# Patient Record
Sex: Female | Born: 1935 | Race: White | Hispanic: No | Smoking: Former smoker
Health system: Southern US, Community
[De-identification: ages and names within clinical notes are randomized; demographics above are authoritative.]

## PROBLEM LIST (undated history)

## (undated) DIAGNOSIS — F419 Anxiety disorder, unspecified: Secondary | ICD-10-CM

## (undated) DIAGNOSIS — R55 Syncope and collapse: Secondary | ICD-10-CM

## (undated) DIAGNOSIS — R51 Headache: Secondary | ICD-10-CM

## (undated) DIAGNOSIS — I2510 Atherosclerotic heart disease of native coronary artery without angina pectoris: Secondary | ICD-10-CM

## (undated) DIAGNOSIS — Z9861 Coronary angioplasty status: Secondary | ICD-10-CM

## (undated) DIAGNOSIS — I420 Dilated cardiomyopathy: Secondary | ICD-10-CM

## (undated) DIAGNOSIS — I4891 Unspecified atrial fibrillation: Secondary | ICD-10-CM

## (undated) DIAGNOSIS — J189 Pneumonia, unspecified organism: Secondary | ICD-10-CM

## (undated) DIAGNOSIS — F329 Major depressive disorder, single episode, unspecified: Secondary | ICD-10-CM

## (undated) DIAGNOSIS — G43909 Migraine, unspecified, not intractable, without status migrainosus: Secondary | ICD-10-CM

## (undated) DIAGNOSIS — I714 Abdominal aortic aneurysm, without rupture, unspecified: Secondary | ICD-10-CM

## (undated) DIAGNOSIS — F32A Depression, unspecified: Secondary | ICD-10-CM

## (undated) DIAGNOSIS — J449 Chronic obstructive pulmonary disease, unspecified: Secondary | ICD-10-CM

## (undated) DIAGNOSIS — B370 Candidal stomatitis: Secondary | ICD-10-CM

## (undated) DIAGNOSIS — R519 Headache, unspecified: Secondary | ICD-10-CM

## (undated) DIAGNOSIS — E785 Hyperlipidemia, unspecified: Secondary | ICD-10-CM

## (undated) DIAGNOSIS — G459 Transient cerebral ischemic attack, unspecified: Secondary | ICD-10-CM

## (undated) DIAGNOSIS — I2109 ST elevation (STEMI) myocardial infarction involving other coronary artery of anterior wall: Secondary | ICD-10-CM

## (undated) DIAGNOSIS — K589 Irritable bowel syndrome without diarrhea: Secondary | ICD-10-CM

## (undated) HISTORY — PX: FRACTURE SURGERY: SHX138

## (undated) HISTORY — PX: TONSILLECTOMY: SUR1361

## (undated) HISTORY — DX: Abdominal aortic aneurysm, without rupture: I71.4

## (undated) HISTORY — DX: Abdominal aortic aneurysm, without rupture, unspecified: I71.40

## (undated) HISTORY — PX: LUMBAR DISC SURGERY: SHX700

## (undated) HISTORY — PX: BACK SURGERY: SHX140

## (undated) HISTORY — PX: ABDOMINAL HYSTERECTOMY: SHX81

## (undated) HISTORY — PX: TOTAL HIP ARTHROPLASTY: SHX124

## (undated) HISTORY — PX: NECK SURGERY: SHX720

## (undated) HISTORY — DX: Unspecified atrial fibrillation: I48.91

## (undated) HISTORY — PX: COLECTOMY: SHX59

---

## 2013-07-04 DIAGNOSIS — J189 Pneumonia, unspecified organism: Secondary | ICD-10-CM

## 2013-07-04 HISTORY — DX: Pneumonia, unspecified organism: J18.9

## 2013-07-09 ENCOUNTER — Emergency Department (HOSPITAL_BASED_OUTPATIENT_CLINIC_OR_DEPARTMENT_OTHER)
Admission: EM | Admit: 2013-07-09 | Discharge: 2013-07-09 | Disposition: A | Discharge status: Other Acute Inpatient Hospital | Payer: Medicare Other | Attending: Emergency Medicine | Admitting: Emergency Medicine

## 2013-07-09 ENCOUNTER — Emergency Department (HOSPITAL_BASED_OUTPATIENT_CLINIC_OR_DEPARTMENT_OTHER): Payer: Medicare Other

## 2013-07-09 ENCOUNTER — Encounter (HOSPITAL_BASED_OUTPATIENT_CLINIC_OR_DEPARTMENT_OTHER): Payer: Self-pay | Admitting: Emergency Medicine

## 2013-07-09 DIAGNOSIS — J4489 Other specified chronic obstructive pulmonary disease: Secondary | ICD-10-CM | POA: Insufficient documentation

## 2013-07-09 DIAGNOSIS — R197 Diarrhea, unspecified: Secondary | ICD-10-CM | POA: Insufficient documentation

## 2013-07-09 DIAGNOSIS — Z79899 Other long term (current) drug therapy: Secondary | ICD-10-CM | POA: Insufficient documentation

## 2013-07-09 DIAGNOSIS — E860 Dehydration: Secondary | ICD-10-CM | POA: Insufficient documentation

## 2013-07-09 DIAGNOSIS — J189 Pneumonia, unspecified organism: Secondary | ICD-10-CM

## 2013-07-09 DIAGNOSIS — Z8673 Personal history of transient ischemic attack (TIA), and cerebral infarction without residual deficits: Secondary | ICD-10-CM | POA: Insufficient documentation

## 2013-07-09 DIAGNOSIS — F329 Major depressive disorder, single episode, unspecified: Secondary | ICD-10-CM | POA: Insufficient documentation

## 2013-07-09 DIAGNOSIS — R05 Cough: Secondary | ICD-10-CM | POA: Insufficient documentation

## 2013-07-09 DIAGNOSIS — R45851 Suicidal ideations: Secondary | ICD-10-CM | POA: Insufficient documentation

## 2013-07-09 DIAGNOSIS — R109 Unspecified abdominal pain: Secondary | ICD-10-CM | POA: Insufficient documentation

## 2013-07-09 DIAGNOSIS — F3289 Other specified depressive episodes: Secondary | ICD-10-CM | POA: Insufficient documentation

## 2013-07-09 DIAGNOSIS — R9431 Abnormal electrocardiogram [ECG] [EKG]: Secondary | ICD-10-CM

## 2013-07-09 DIAGNOSIS — R059 Cough, unspecified: Secondary | ICD-10-CM | POA: Insufficient documentation

## 2013-07-09 DIAGNOSIS — F411 Generalized anxiety disorder: Secondary | ICD-10-CM | POA: Insufficient documentation

## 2013-07-09 DIAGNOSIS — R51 Headache: Secondary | ICD-10-CM | POA: Insufficient documentation

## 2013-07-09 DIAGNOSIS — F17200 Nicotine dependence, unspecified, uncomplicated: Secondary | ICD-10-CM | POA: Insufficient documentation

## 2013-07-09 DIAGNOSIS — F419 Anxiety disorder, unspecified: Secondary | ICD-10-CM

## 2013-07-09 DIAGNOSIS — J159 Unspecified bacterial pneumonia: Secondary | ICD-10-CM | POA: Insufficient documentation

## 2013-07-09 DIAGNOSIS — R9389 Abnormal findings on diagnostic imaging of other specified body structures: Secondary | ICD-10-CM | POA: Insufficient documentation

## 2013-07-09 DIAGNOSIS — J449 Chronic obstructive pulmonary disease, unspecified: Secondary | ICD-10-CM | POA: Insufficient documentation

## 2013-07-09 HISTORY — DX: Depression, unspecified: F32.A

## 2013-07-09 HISTORY — DX: Anxiety disorder, unspecified: F41.9

## 2013-07-09 HISTORY — DX: Transient cerebral ischemic attack, unspecified: G45.9

## 2013-07-09 HISTORY — DX: Chronic obstructive pulmonary disease, unspecified: J44.9

## 2013-07-09 HISTORY — DX: Major depressive disorder, single episode, unspecified: F32.9

## 2013-07-09 LAB — CBC WITH DIFFERENTIAL/PLATELET
BASOS ABS: 0 10*3/uL (ref 0.0–0.1)
Basophils Relative: 0 % (ref 0–1)
Eosinophils Absolute: 0 10*3/uL (ref 0.0–0.7)
Eosinophils Relative: 0 % (ref 0–5)
HEMATOCRIT: 41.1 % (ref 36.0–46.0)
HEMOGLOBIN: 14.3 g/dL (ref 12.0–15.0)
LYMPHS PCT: 5 % — AB (ref 12–46)
Lymphs Abs: 0.5 10*3/uL — ABNORMAL LOW (ref 0.7–4.0)
MCH: 34.1 pg — ABNORMAL HIGH (ref 26.0–34.0)
MCHC: 34.8 g/dL (ref 30.0–36.0)
MCV: 98.1 fL (ref 78.0–100.0)
MONO ABS: 1.5 10*3/uL — AB (ref 0.1–1.0)
MONOS PCT: 16 % — AB (ref 3–12)
Neutro Abs: 7.4 10*3/uL (ref 1.7–7.7)
Neutrophils Relative %: 79 % — ABNORMAL HIGH (ref 43–77)
Platelets: 288 10*3/uL (ref 150–400)
RBC: 4.19 MIL/uL (ref 3.87–5.11)
RDW: 12.1 % (ref 11.5–15.5)
WBC: 9.4 10*3/uL (ref 4.0–10.5)

## 2013-07-09 LAB — BASIC METABOLIC PANEL
BUN: 8 mg/dL (ref 6–23)
CO2: 25 mEq/L (ref 19–32)
CREATININE: 0.8 mg/dL (ref 0.50–1.10)
Calcium: 10.3 mg/dL (ref 8.4–10.5)
Chloride: 91 mEq/L — ABNORMAL LOW (ref 96–112)
GFR calc Af Amer: 81 mL/min — ABNORMAL LOW (ref >90)
GFR calc non Af Amer: 70 mL/min — ABNORMAL LOW (ref >90)
Glucose, Bld: 135 mg/dL — ABNORMAL HIGH (ref 70–99)
Potassium: 4.2 mEq/L (ref 3.7–5.3)
Sodium: 133 mEq/L — ABNORMAL LOW (ref 137–147)

## 2013-07-09 LAB — RAPID URINE DRUG SCREEN, HOSP PERFORMED
Amphetamines: NOT DETECTED
BENZODIAZEPINES: NOT DETECTED
Barbiturates: NOT DETECTED
Cocaine: NOT DETECTED
Opiates: NOT DETECTED
Tetrahydrocannabinol: NOT DETECTED

## 2013-07-09 LAB — CK: Total CK: 83 U/L (ref 7–177)

## 2013-07-09 LAB — ACETAMINOPHEN LEVEL

## 2013-07-09 LAB — URINALYSIS, ROUTINE W REFLEX MICROSCOPIC
Bilirubin Urine: NEGATIVE
GLUCOSE, UA: NEGATIVE mg/dL
Ketones, ur: NEGATIVE mg/dL
LEUKOCYTES UA: NEGATIVE
Nitrite: NEGATIVE
PH: 7 (ref 5.0–8.0)
Protein, ur: NEGATIVE mg/dL
SPECIFIC GRAVITY, URINE: 1.005 (ref 1.005–1.030)
Urobilinogen, UA: 0.2 mg/dL (ref 0.0–1.0)

## 2013-07-09 LAB — HEPATIC FUNCTION PANEL
ALT: 10 U/L (ref 0–35)
AST: 24 U/L (ref 0–37)
Albumin: 4.3 g/dL (ref 3.5–5.2)
Alkaline Phosphatase: 63 U/L (ref 39–117)
BILIRUBIN TOTAL: 0.4 mg/dL (ref 0.3–1.2)
TOTAL PROTEIN: 8 g/dL (ref 6.0–8.3)

## 2013-07-09 LAB — URINE MICROSCOPIC-ADD ON

## 2013-07-09 LAB — TROPONIN I

## 2013-07-09 LAB — ETHANOL: Alcohol, Ethyl (B): <11 mg/dL (ref 0–11)

## 2013-07-09 LAB — SALICYLATE LEVEL: Salicylate Lvl: <2 mg/dL — ABNORMAL LOW (ref 2.8–20.0)

## 2013-07-09 MED ORDER — LORAZEPAM 2 MG/ML IJ SOLN: 1.0000 mg | Freq: Once | INTRAMUSCULAR | Status: DC

## 2013-07-09 MED ORDER — LORAZEPAM 2 MG/ML IJ SOLN
INTRAMUSCULAR | Status: AC
  Filled 2013-07-09: qty 1

## 2013-07-09 MED ORDER — AZITHROMYCIN 250 MG PO TABS
ORAL_TABLET | ORAL | Status: AC
  Filled 2013-07-09: qty 1

## 2013-07-09 MED ORDER — ACETAMINOPHEN 325 MG PO TABS
650.0000 mg | ORAL_TABLET | Freq: Once | ORAL | Status: AC
  Administered 2013-07-09: 650 mg via ORAL
  Filled 2013-07-09: qty 2

## 2013-07-09 MED ORDER — DEXTROSE 5 % IV SOLN
1.0000 g | Freq: Once | INTRAVENOUS | Status: AC
  Administered 2013-07-09: 1 g via INTRAVENOUS

## 2013-07-09 MED ORDER — CEFTRIAXONE SODIUM 1 G IJ SOLR
INTRAMUSCULAR | Status: AC
  Filled 2013-07-09: qty 10

## 2013-07-09 MED ORDER — LORAZEPAM 2 MG/ML IJ SOLN
1.0000 mg | Freq: Once | INTRAMUSCULAR | Status: AC
  Administered 2013-07-09: 1 mg via INTRAVENOUS
  Filled 2013-07-09: qty 1

## 2013-07-09 MED ORDER — SODIUM CHLORIDE 0.9 % IV BOLUS (SEPSIS)
1000.0000 mL | Freq: Once | INTRAVENOUS | Status: AC
  Administered 2013-07-09: 1000 mL via INTRAVENOUS

## 2013-07-09 MED ORDER — AZITHROMYCIN 250 MG PO TABS
500.0000 mg | ORAL_TABLET | Freq: Once | ORAL | Status: AC
  Administered 2013-07-09: 500 mg via ORAL
  Filled 2013-07-09: qty 2

## 2013-07-09 NOTE — ED Notes (Signed)
Report called to [place], RN [facility] Report to [place], NIKEN [facility].

## 2013-07-09 NOTE — ED Notes (Signed)
PCXR performed

## 2013-07-09 NOTE — ED Notes (Signed)
Room changed to 716 at [facility]

## 2013-07-09 NOTE — ED Notes (Signed)
Pt is anxious with tremors.

## 2013-07-09 NOTE — ED Provider Notes (Signed)
CSN: [id number]     Arrival date & time 07/09/13  1040 History   First MD Initiated Contact with Patient 07/09/13 1119     Chief Complaint  Patient presents with  . Anxiety      Patient is a 79 y.o. female presenting with anxiety. The history is provided by the patient and a relative.  Anxiety This is a recurrent problem. The current episode started more than 1 week ago. The problem occurs constantly. The problem has been rapidly worsening. Associated symptoms include abdominal pain and headaches. Pertinent negatives include no chest pain. Associated symptoms comments: Chronic abdominal pain . Nothing aggravates the symptoms. Nothing relieves the symptoms.   Pt presents for multiple complaints She has problems with anxiety and depression . She reports she has thoughts to harm herself, but she does not have a plan.  She reports previous suicide attempt last year by overdose.  Denies self harm today  She also reports chronic abdominal pain and diarrhea for "months" She also reports mild HA   She reports to drinking glass of wine daily, last drink was 2 days ago Denies h/o ETOH withdrawal Past Medical History  Diagnosis Date  . COPD (chronic obstructive pulmonary disease)   . Anxiety   . Depression   . TIA (transient ischemic attack)    Past Surgical History  Procedure Laterality Date  . Back surgery    . Neck surgery    . Colectomy     No family history on file. History  Substance Use Topics  . Smoking status: Current Every Day Smoker -- 0.50 packs/day    Types: Cigarettes  . Smokeless tobacco: Not on file  . Alcohol Use: 1.2 oz/week    2 Glasses of wine per week     Comment: last drink was Wednesday   OB History   Grav Para Term Preterm Abortions TAB SAB Ect Mult Living                 Review of Systems  Constitutional: Positive for fatigue.  Respiratory: Positive for cough.   Cardiovascular: Negative for chest pain.  Gastrointestinal: Positive for abdominal pain  and diarrhea.  Neurological: Positive for headaches. Negative for syncope.       Denies focal weakness, denies stroke like symptoms   Psychiatric/Behavioral: Positive for suicidal ideas. The patient is nervous/anxious.   All other systems reviewed and are negative.      Allergies  Review of patient's allergies indicates no known allergies.  Home Medications   Current Outpatient Rx  Name  Route  Sig  Dispense  Refill  . diphenoxylate-atropine (LOMOTIL) 2.5-0.025 MG per tablet   Oral   Take by mouth 4 (four) times daily as needed for diarrhea or loose stools.         . venlafaxine (EFFEXOR) 37.5 MG tablet   Oral   Take 37.5 mg by mouth daily.          BP 148/65  Pulse 116  Temp(Src) 98.6 F (37 C) (Oral)  Resp 20  Ht 5' (1.524 m)  Wt 79 lb 12.9 oz (36.2 kg)  BMI 15.59 kg/m2  SpO2 100% BP 148/74  Pulse 101  Temp(Src) 98.6 F (37 C) (Oral)  Resp 22  Ht 5' (1.524 m)  Wt 79 lb 12.9 oz (36.2 kg)  BMI 15.59 kg/m2  SpO2 99%  Physical Exam CONSTITUTIONAL: elderly, frail  HEAD: Normocephalic/atraumatic EYES: EOMI ENMT: Mucous membranes dry NECK: supple no meningeal signs SPINE:entire spine nontender, No  bruising/crepitance/stepoffs noted to spine CV: tachycardic S1/S2 noted, no murmurs/rubs/gallops noted LUNGS: crackles left base, mild tachypnea noted ABDOMEN: soft, nontender, no rebound or guarding GU:no cva tenderness NEURO: Pt is awake/alert, moves all extremitiesx4, no arm/leg drift is noted.  Tremors noted bilateral UE EXTREMITIES: pulses normal, full ROM SKIN: warm, color normal PSYCH:   ED Course  Procedures  12:20 PM Pt here for multiple complaints, but significant for anxiety and SI.  telepsych has been consulted and a sitter has been requested.  Will also evaluate with labs/imaging and give ativan for anxiety Pt agreeable with plan 12:31 PM Seen by telepsych [place] Pt would benefit from psych admission Will call back once medically cleared Call  back at [phone] 1:28 PM Pt admitted in 12/2012 for suicidal ideation.  She had overdosed on prozac and remeron 2:05 PM Her EKG is abnormal, though no CP currently and negative troponin She still has tremors Possible pneumonia by xray No recent admissions in past 3 months Will need medical admission for abnormal EKG, pneumonia (troponin is negative, no active CP at this time) BP 148/74  Pulse 101  Temp(Src) 98.6 F (37 C) (Oral)  Resp 22  Ht 5' (1.524 m)  Wt 79 lb 12.9 oz (36.2 kg)  BMI 15.59 kg/m2  SpO2 99% She prefers [facility] for admission Will also need inpatient psych consult 2:14 PM D/w PA Karalee Height at [facility] Pt to be accepted to their service Dr. Sherlynn Stalls Advised she would need sitter until evaluated by psychiatry Pt stabilized in the ER Labs Review Labs Reviewed  URINALYSIS, ROUTINE W REFLEX MICROSCOPIC - Abnormal; Notable for the following:    Hgb urine dipstick TRACE (*)    All other components within normal limits  CBC WITH DIFFERENTIAL - Abnormal; Notable for the following:    MCH 34.1 (*)    Neutrophils Relative % 79 (*)    Lymphocytes Relative 5 (*)    Lymphs Abs 0.5 (*)    Monocytes Relative 16 (*)    Monocytes Absolute 1.5 (*)    All other components within normal limits  BASIC METABOLIC PANEL - Abnormal; Notable for the following:    Sodium 133 (*)    Chloride 91 (*)    Glucose, Bld 135 (*)    GFR calc non Af Amer 70 (*)    GFR calc Af Amer 81 (*)    All other components within normal limits  SALICYLATE LEVEL - Abnormal; Notable for the following:    Salicylate Lvl <2.0 (*)    All other components within normal limits  ETHANOL  URINE RAPID DRUG SCREEN (HOSP PERFORMED)  HEPATIC FUNCTION PANEL  ACETAMINOPHEN LEVEL  CK  URINE MICROSCOPIC-ADD ON  TROPONIN I  URINALYSIS, ROUTINE W REFLEX MICROSCOPIC  URINE RAPID DRUG SCREEN (HOSP PERFORMED)   Imaging Review Dg Chest Portable 1 View  07/09/2013   CLINICAL DATA:  cough   EXAM: PORTABLE CHEST - 1 VIEW  COMPARISON:  None.  FINDINGS: The heart size and mediastinal contours are within normal limits. Areas of vague density projects within the lung bases. An area of ill-defined density with a slight nodular component projects in the periphery of the right hemi thorax. There is mild prominence of the interstitial markings. No focal regions consolidation appreciated. The osseous structures demonstrate no acute abnormalities. The bones are osteopenic. The lungs appear hyperinflated.  IMPRESSION: Vague areas of increased density which may represent infiltrates versus atelectasis in the region periphery of the right hemothorax as  well as within the lung bases. Further evaluation with dedicated PA and lateral views of the chest is recommended.  Mild prominence of the interstitial markings likely representing underlying chronic bronchitic changes.  Mild hyperinflation.   Electronically Signed   By: Salome Holmes M.D.   On: 07/09/2013 13:11     EKG Interpretation  Date/Time:  Friday July 09 2013 13:03:41 EST Ventricular Rate:  98 PR Interval:  138 QRS Duration: 64 QT Interval:  408 QTC Calculation: 520 R Axis:   86 Text Interpretation:  Normal sinus rhythm Anteroseptal infarct , age undetermined ST \\T \ T wave abnormality, consider inferolateral ischemia Prolonged QT Abnormal ECG EKG from [facility] admission to [facility] from 12/06/2012 did not show these abnormalities Confirmed by Bebe Shaggy  MD, Tonianne Fine ([id number]) on 07/09/2013 1:31:38 PM          MDM  Nursing notes including past medical history and social history reviewed and considered in documentation xrays reviewed and considered Labs/vital reviewed and considered Previous records reviewed and considered - records from OSH reviewed  Final diagnoses:  CAP (community acquired pneumonia)  Dehydration  Anxiety  Suicidal ideation  Abnormal EKG        Joya Gaskins, MD 07/09/13 1416

## 2013-07-09 NOTE — ED Notes (Signed)
[facility] is transporting patient to [facility] to room 657
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
## 2013-07-09 NOTE — ED Notes (Signed)
Urine recollected 

## 2013-07-09 NOTE — ED Notes (Signed)
TTS interviewing pt.  Family remains at bedside.

## 2013-07-09 NOTE — ED Notes (Signed)
[facility]

## 2013-07-09 NOTE — ED Notes (Signed)
PO fluids and warm blankets provided.  Daughter in law at bedside.

## 2013-07-09 NOTE — BH Assessment (Signed)
Writer contacted [facility] to follow up on patient's disposition. Writer spoke to patients nurse who stated that patient was being admitted to the medical floor. No further services needed by TTS.

## 2013-07-09 NOTE — ED Notes (Signed)
[facility] about noon.

## 2013-07-09 NOTE — ED Notes (Signed)
[facility]

## 2013-07-09 NOTE — ED Notes (Signed)
MD at bedside. 

## 2013-07-09 NOTE — ED Notes (Signed)
Pt and family informed of plan of care and admission 

## 2013-07-09 NOTE — BH Assessment (Signed)
Writer scheduled to see this patient at noon via TA.

## 2013-07-09 NOTE — ED Notes (Signed)
Awaiting a bed.

## 2013-07-09 NOTE — ED Notes (Signed)
Called AC and no sitter available. Staffing called and made aware if one is available.

## 2013-07-09 NOTE — BH Assessment (Signed)
Patient seen by TTS (TA completed). Patient reports suicidal thoughts, no plan. Family (Daughter in Social workerlaw) is however not open to inpatient treatment due to a previous bad experience at another facility. Discussed reluctance for inpatient tx with EDP-Dr. Bebe ShaggyWickline and he will re-evaluate patient's suicidal ideations. TTS also learned after completing the assessment that pt is not medically cleared. Dr. Bebe ShaggyWickline is working on medical concerns. Patient is at a stand still at this time until Dr. Bebe ShaggyWickline updates TTS on how to proceed with patient's care.

## 2013-07-09 NOTE — ED Notes (Signed)
Pt reports she does not take medication as prescribed because "they make my stomach hurt and my mouth dry".

## 2013-07-09 NOTE — ED Notes (Signed)
Pt has chronic anxiety and depression.  The past 2 days she has had increased anxiety, diarrhea and not eating.  She expresses SI without a plan.

## 2013-07-09 NOTE — ED Notes (Signed)
Pt transferred to [facility] and in stable condition upon transfer.

## 2014-05-06 DIAGNOSIS — I714 Abdominal aortic aneurysm, without rupture, unspecified: Secondary | ICD-10-CM

## 2014-05-06 HISTORY — DX: Abdominal aortic aneurysm, without rupture: I71.4

## 2014-05-06 HISTORY — DX: Abdominal aortic aneurysm, without rupture, unspecified: I71.40

## 2014-08-17 ENCOUNTER — Emergency Department (HOSPITAL_BASED_OUTPATIENT_CLINIC_OR_DEPARTMENT_OTHER): Payer: Medicare Other

## 2014-08-17 ENCOUNTER — Emergency Department (HOSPITAL_BASED_OUTPATIENT_CLINIC_OR_DEPARTMENT_OTHER)
Admission: EM | Admit: 2014-08-17 | Discharge: 2014-08-17 | Disposition: A | Discharge status: Home / Self Care | Payer: Medicare Other | Attending: Emergency Medicine | Admitting: Emergency Medicine

## 2014-08-17 ENCOUNTER — Other Ambulatory Visit: Payer: Self-pay | Admitting: *Deleted

## 2014-08-17 ENCOUNTER — Encounter (HOSPITAL_BASED_OUTPATIENT_CLINIC_OR_DEPARTMENT_OTHER): Payer: Self-pay | Admitting: *Deleted

## 2014-08-17 ENCOUNTER — Other Ambulatory Visit: Payer: Self-pay

## 2014-08-17 DIAGNOSIS — Z23 Encounter for immunization: Secondary | ICD-10-CM | POA: Insufficient documentation

## 2014-08-17 DIAGNOSIS — I714 Abdominal aortic aneurysm, without rupture, unspecified: Secondary | ICD-10-CM

## 2014-08-17 DIAGNOSIS — Y9289 Other specified places as the place of occurrence of the external cause: Secondary | ICD-10-CM | POA: Diagnosis not present

## 2014-08-17 DIAGNOSIS — S3992XA Unspecified injury of lower back, initial encounter: Secondary | ICD-10-CM | POA: Insufficient documentation

## 2014-08-17 DIAGNOSIS — J449 Chronic obstructive pulmonary disease, unspecified: Secondary | ICD-10-CM | POA: Diagnosis not present

## 2014-08-17 DIAGNOSIS — M545 Low back pain: Secondary | ICD-10-CM

## 2014-08-17 DIAGNOSIS — S4991XA Unspecified injury of right shoulder and upper arm, initial encounter: Secondary | ICD-10-CM | POA: Diagnosis not present

## 2014-08-17 DIAGNOSIS — Y9389 Activity, other specified: Secondary | ICD-10-CM | POA: Diagnosis not present

## 2014-08-17 DIAGNOSIS — S199XXA Unspecified injury of neck, initial encounter: Secondary | ICD-10-CM | POA: Insufficient documentation

## 2014-08-17 DIAGNOSIS — Z720 Tobacco use: Secondary | ICD-10-CM | POA: Diagnosis not present

## 2014-08-17 DIAGNOSIS — R51 Headache: Secondary | ICD-10-CM | POA: Insufficient documentation

## 2014-08-17 DIAGNOSIS — Z8673 Personal history of transient ischemic attack (TIA), and cerebral infarction without residual deficits: Secondary | ICD-10-CM | POA: Diagnosis not present

## 2014-08-17 DIAGNOSIS — Y998 Other external cause status: Secondary | ICD-10-CM | POA: Insufficient documentation

## 2014-08-17 DIAGNOSIS — F419 Anxiety disorder, unspecified: Secondary | ICD-10-CM | POA: Diagnosis not present

## 2014-08-17 DIAGNOSIS — W01198A Fall on same level from slipping, tripping and stumbling with subsequent striking against other object, initial encounter: Secondary | ICD-10-CM | POA: Insufficient documentation

## 2014-08-17 DIAGNOSIS — W19XXXA Unspecified fall, initial encounter: Secondary | ICD-10-CM

## 2014-08-17 DIAGNOSIS — Z48812 Encounter for surgical aftercare following surgery on the circulatory system: Secondary | ICD-10-CM

## 2014-08-17 DIAGNOSIS — Z79899 Other long term (current) drug therapy: Secondary | ICD-10-CM | POA: Diagnosis not present

## 2014-08-17 DIAGNOSIS — F329 Major depressive disorder, single episode, unspecified: Secondary | ICD-10-CM | POA: Diagnosis not present

## 2014-08-17 LAB — CBC WITH DIFFERENTIAL/PLATELET
BASOS ABS: 0 10*3/uL (ref 0.0–0.1)
Basophils Relative: 0 % (ref 0–1)
EOS PCT: 1 % (ref 0–5)
Eosinophils Absolute: 0.1 10*3/uL (ref 0.0–0.7)
HEMATOCRIT: 42.4 % (ref 36.0–46.0)
Hemoglobin: 14.9 g/dL (ref 12.0–15.0)
Lymphocytes Relative: 29 % (ref 12–46)
Lymphs Abs: 2.5 10*3/uL (ref 0.7–4.0)
MCH: 34.7 pg — ABNORMAL HIGH (ref 26.0–34.0)
MCHC: 35.1 g/dL (ref 30.0–36.0)
MCV: 98.6 fL (ref 78.0–100.0)
MONO ABS: 0.8 10*3/uL (ref 0.1–1.0)
Monocytes Relative: 9 % (ref 3–12)
Neutro Abs: 5.4 10*3/uL (ref 1.7–7.7)
Neutrophils Relative %: 61 % (ref 43–77)
PLATELETS: 271 10*3/uL (ref 150–400)
RBC: 4.3 MIL/uL (ref 3.87–5.11)
RDW: 12.9 % (ref 11.5–15.5)
WBC: 8.8 10*3/uL (ref 4.0–10.5)

## 2014-08-17 LAB — TROPONIN I: Troponin I: <0.03 ng/mL (ref <0.031)

## 2014-08-17 LAB — BASIC METABOLIC PANEL
Anion gap: 9 (ref 5–15)
BUN: 14 mg/dL (ref 6–23)
CO2: 28 mmol/L (ref 19–32)
Calcium: 9.5 mg/dL (ref 8.4–10.5)
Chloride: 98 mmol/L (ref 96–112)
Creatinine, Ser: 0.97 mg/dL (ref 0.50–1.10)
GFR calc Af Amer: 64 mL/min — ABNORMAL LOW (ref >90)
GFR, EST NON AFRICAN AMERICAN: 55 mL/min — AB (ref >90)
Glucose, Bld: 102 mg/dL — ABNORMAL HIGH (ref 70–99)
POTASSIUM: 4.1 mmol/L (ref 3.5–5.1)
SODIUM: 135 mmol/L (ref 135–145)

## 2014-08-17 MED ORDER — ACETAMINOPHEN 325 MG PO TABS
650.0000 mg | ORAL_TABLET | Freq: Once | ORAL | Status: AC
  Administered 2014-08-17: 650 mg via ORAL
  Filled 2014-08-17: qty 2

## 2014-08-17 MED ORDER — TETANUS-DIPHTH-ACELL PERTUSSIS 5-2.5-18.5 LF-MCG/0.5 IM SUSP
0.5000 mL | Freq: Once | INTRAMUSCULAR | Status: AC
  Administered 2014-08-17: 0.5 mL via INTRAMUSCULAR
  Filled 2014-08-17: qty 0.5

## 2014-08-17 NOTE — ED Provider Notes (Signed)
CSN: [id number]     Arrival date & time 08/17/14  1211 History  This chart was scribed for Tonya Octave, MD by Leone Payor, ED Scribe. This patient was seen in room MH01/MH01 and the patient's care was started 12:36 PM.    Chief Complaint  Patient presents with  . Fall    The history is provided by the patient. No language interpreter was used.     HPI Comments: Tonya Payne is a 80 y.o. female who presents to the Emergency Department s/p fall complaining of gradual onset, constant, gradually worsened back pain, neck pain, and right shoulder pain that began after a fall 2 nights ago. Patient states she lost her balance in the bathroom and fell backwards into the tub, striking her head, buttocks, and back. She reports a head injury but no LOC. She reports having increased pain today so her neighbor took her to an Urgent Care. She was transferred here from [facility] for further evaluation. She denies anticoagulant use. She denies abdominal pain, chest pain, SOB, hip pain.   Past Medical History  Diagnosis Date  . COPD (chronic obstructive pulmonary disease)   . Anxiety   . Depression   . TIA (transient ischemic attack)    Past Surgical History  Procedure Laterality Date  . Back surgery    . Neck surgery    . Colectomy     No family history on file. History  Substance Use Topics  . Smoking status: Current Every Day Smoker -- 0.50 packs/day    Types: Cigarettes  . Smokeless tobacco: Never Used  . Alcohol Use: 1.2 oz/week    2 Glasses of wine per week   OB History    No data available     Review of Systems  A complete 10 system review of systems was obtained and all systems are negative except as noted in the HPI and PMH.    Allergies  Review of patient's allergies indicates no known allergies.  Home Medications   Prior to Admission medications   Medication Sig Start Date End Date Taking? Authorizing Provider  alendronate (FOSAMAX) 70 MG tablet Take 70 mg by mouth once  a week. Take with a full glass of water on an empty stomach.   Yes Historical Provider, MD  escitalopram (LEXAPRO) 10 MG tablet Take 10 mg by mouth daily.   Yes Historical Provider, MD  traZODone (DESYREL) 100 MG tablet Take 100 mg by mouth at bedtime.   Yes Historical Provider, MD  diphenoxylate-atropine (LOMOTIL) 2.5-0.025 MG per tablet Take by mouth 4 (four) times daily as needed for diarrhea or loose stools.    Historical Provider, MD   BP 115/77 mmHg  Pulse 77  Temp(Src) 98 F (36.7 C) (Oral)  Resp 20  Ht 5' (1.524 m)  SpO2 100% Physical Exam  Constitutional: She is oriented to person, place, and time. She appears well-developed and well-nourished. No distress.  HENT:  Head: Normocephalic and atraumatic.  Mouth/Throat: Oropharynx is clear and moist. No oropharyngeal exudate.  Hematoma to occiput   Eyes: Conjunctivae and EOM are normal. Pupils are equal, round, and reactive to light.  Neck: Normal range of motion. Neck supple.  Diffuse C-spine tenderness, no step offs or deformities. No meningismus.  Cardiovascular: Normal rate, regular rhythm, normal heart sounds and intact distal pulses.   No murmur heard. Pulmonary/Chest: Effort normal and breath sounds normal. No respiratory distress. She has no wheezes. She has no rales. She exhibits no tenderness.  Abdominal: Soft. There  is no tenderness. There is no rebound and no guarding.  Musculoskeletal: Normal range of motion. She exhibits tenderness. She exhibits no edema.  Tenderness to upper and mid thoracic spine. Ecchymosis to lumbar spine. Old ecchymosis to right posterior shoulder. Skin tear to right dorsal forearm and right elbow   Neurological: She is alert and oriented to person, place, and time. No cranial nerve deficit. She exhibits normal muscle tone. Coordination normal.  No ataxia on finger to nose bilaterally. No pronator drift. 5/5 strength throughout. CN 2-12 intact. Negative Romberg. Equal grip strength. Sensation  intact.   Skin: Skin is warm.  Psychiatric: She has a normal mood and affect. Her behavior is normal.  Nursing note and vitals reviewed.   ED Course  Procedures (including critical care time)  DIAGNOSTIC STUDIES: Oxygen Saturation is 100% on RA, normal by my interpretation.    COORDINATION OF CARE: 12:43 PM Discussed treatment plan with pt at bedside and pt agreed to plan.   Labs Review Labs Reviewed  CBC WITH DIFFERENTIAL/PLATELET - Abnormal; Notable for the following:    MCH 34.7 (*)    All other components within normal limits  BASIC METABOLIC PANEL - Abnormal; Notable for the following:    Glucose, Bld 102 (*)    GFR calc non Af Amer 55 (*)    GFR calc Af Amer 64 (*)    All other components within normal limits  TROPONIN I    Imaging Review Ct Abdomen Pelvis Wo Contrast  08/17/2014   CLINICAL DATA:  80 year old female with abdominal aortic aneurysm evident on lumbar radiographs today. Fell 2 days ago in bathtub with pain. Initial encounter.  EXAM: CT ABDOMEN AND PELVIS WITHOUT CONTRAST  TECHNIQUE: Multidetector CT imaging of the abdomen and pelvis was performed following the standard protocol without IV contrast.  COMPARISON:  Lumbar radiographs today reported separately. CT Abdomen and Pelvis 05/14/2013.  FINDINGS: Stable lung bases with emphysema and early honeycombing greater on the right. No pericardial or pleural effusion.  Postoperative changes to the lower lumbar spine. No acute osseous abnormality identified.  No pelvic free fluid. Surgically absent uterus and adnexa. Decompressed distal colon. Moderate bladder distension.  Mild diverticulosis of the left colon. Mid transverse colon anastomosis with no adverse features. Negative right colon. No dilated small bowel. Negative non contrast stomach, duodenum, liver, gallbladder, spleen, pancreas and adrenal glands.  Chronic left renal atrophy has not significantly changed. Negative non contrast left kidney. No abdominal free  fluid.  No retroperitoneal fluid or hemorrhage. Chronic extensive Aortoiliac calcified atherosclerosis noted. Infrarenal abdominal aortic aneurysm has increased in size since 05/14/2013, now 50 mm maximum diameter (versus 47 mm previously).  IMPRESSION: 1. Chronic infrarenal abdominal aortic aneurysm has enlarged by 3 mm since 05/14/2013, now measuring up to 5.0 cm diameter. Recommend followup by abdomen and pelvis CTA in 3-6 months, and vascular surgery referral/consultation if not already obtained. This recommendation follows ACR consensus guidelines: White Paper of the ACR Incidental Findings Committee II on Vascular Findings. J Am Coll Radiol 2013; 10:789-794. 2. Otherwise stable abdomen and pelvis. 3. Chronic lung disease.   Electronically Signed   By: Odessa Fleming M.D.   On: 08/17/2014 14:13   Dg Chest 2 View  08/17/2014   CLINICAL DATA:  80 year old female who fell 2 days ago in the bath tub. Pain. Initial encounter.  EXAM: CHEST  2 VIEW  COMPARISON:  Chest radiographs 07/09/2013 and earlier.  FINDINGS: Chronic hyperinflation. Normal cardiac size and mediastinal contours. Chronic diffuse increased  interstitial markings, with more pronounced chronic reticulonodular opacity both lung bases and also the right upper lobe. No acute pulmonary opacity identified. Chronic lower cervical ACDF. Calcified atherosclerosis of the aorta.  IMPRESSION: No acute cardiopulmonary abnormality or acute traumatic injury identified.  Chronic lung disease with hyperinflation.   Electronically Signed   By: Odessa Fleming M.D.   On: 08/17/2014 13:33   Dg Thoracic Spine 2 View  08/17/2014   CLINICAL DATA:  80 year old female who fell 2 days ago in the bath tub. Pain. Initial encounter.  EXAM: THORACIC SPINE - 2 VIEW  COMPARISON:  Chest radiographs 07/09/2013 and earlier.  FINDINGS: Normal thoracic segmentation. Stable bone mineralization. Chronic lower cervical ACDF. Stable thoracic vertebral height and alignment. Cervicothoracic junction  alignment is within normal limits. Visible upper lumbar levels appear intact.  Calcified atherosclerosis of the aorta. Chronic large lung volumes. Grossly stable thoracic visceral contours. Visible posterior ribs appear intact.  IMPRESSION: No acute fracture or listhesis identified in the thoracic spine.   Electronically Signed   By: Odessa Fleming M.D.   On: 08/17/2014 13:32   Dg Lumbar Spine Complete  08/17/2014   CLINICAL DATA:  Pain following fall 2 days prior  EXAM: LUMBAR SPINE - COMPLETE 4+ VIEW  COMPARISON:  May 14, 2013 CT abdomen and pelvis  FINDINGS: Frontal, lateral, spot lumbosacral lateral, and bilateral oblique views were obtained. The there are 5 non-rib-bearing lumbar type vertebral bodies. There is no fracture or spondylolisthesis. Disc spaces appear intact. There is facet osteoarthritic change at L4-5 and L5-S1 bilaterally.  There is an abdominal aortic aneurysm with a maximum transverse diameter of 6.0 x 5.7 cm.  IMPRESSION: Areas of osteoarthritic change. No fracture or spondylolisthesis. Abdominal aortic aneurysm which appears to have increased in size based on measurements from prior CT. Noncontrast enhanced CT to more precisely compare aneurysm size compared to prior CT examination advised.   Electronically Signed   By: Bretta Bang III M.D.   On: 08/17/2014 13:32   Dg Pelvis 1-2 Views  08/17/2014   CLINICAL DATA:  Fall, back pain  EXAM: PELVIS - 1-2 VIEW  COMPARISON:  None.  FINDINGS: No fracture or dislocation is seen.  Visualized bony pelvis appears intact.  Mild degenerative changes of the bilateral hips.  Degenerative changes of the lower lumbar spine.  Vascular calcifications.  IMPRESSION: No fracture or dislocation is seen.   Electronically Signed   By: Charline Bills M.D.   On: 08/17/2014 13:30   Dg Shoulder Right  08/17/2014   CLINICAL DATA:  Fall, right shoulder pain  EXAM: RIGHT SHOULDER - 2+ VIEW  COMPARISON:  None.  FINDINGS: No fracture or dislocation is seen.  Mild  degenerative changes of the glenohumeral joint.  The visualized soft tissues are unremarkable.  Visualized right lung is clear.  IMPRESSION: No fracture or dislocation is seen.   Electronically Signed   By: Charline Bills M.D.   On: 08/17/2014 13:28   Dg Wrist Complete Right  08/17/2014   CLINICAL DATA:  Fall, right posterior wrist abrasion  EXAM: RIGHT WRIST - COMPLETE 3+ VIEW  COMPARISON:  None.  FINDINGS: No fracture or dislocation is seen.  The joint spaces are preserved.  Soft tissue swelling/laceration along the dorsal aspect of the wrist.  No radiopaque foreign body is seen.  IMPRESSION: No fracture, dislocation, or radiopaque foreign body is seen.   Electronically Signed   By: Charline Bills M.D.   On: 08/17/2014 13:29   Ct Head Wo Contrast  08/17/2014   CLINICAL DATA:  FALL posterior head trauma. Cervicalgia/neck pain. Bathtub injury. RIGHT shoulder pain. cervicalgia/neck pain.  EXAM: CT HEAD WITHOUT CONTRAST  CT CERVICAL SPINE WITHOUT CONTRAST  TECHNIQUE: Multidetector CT imaging of the head and cervical spine was performed following the standard protocol without intravenous contrast. Multiplanar CT image reconstructions of the cervical spine were also generated.  COMPARISON:  Chest radiograph 01/21/2013.  FINDINGS: CT HEAD FINDINGS  No mass lesion, mass effect, midline shift, hydrocephalus, hemorrhage. No acute territorial cortical ischemia/infarct. Atrophy and chronic ischemic white matter disease is present. Scout image appears within normal limits. Intracranial atherosclerosis is present.  CT CERVICAL SPINE FINDINGS  Alignment: Cervical collar is present.  The alignment is anatomic.  Craniocervical junction: Atlantodental degenerative disease. Occipital condyles appear intact. Odontoid intact. The C1 ring is within normal limits.  Vertebrae: C5 through C7 ACDF. Good incorporation of interbody bone graft at C5-C6 with partial incorporation C6 - C7. There is lucency around the screws at C7  compatible with ongoing motion mechanical loosening. Although screws do appear to be loosening, there also appears to be a tiny amount of bridging bone across the disc space suggesting that fusion will proceed. No destructive osseous lesions. No fracture.  Paraspinal soft tissues: Atherosclerosis.  Lung apices: Right-greater-than-left pleural apical scarring is present. Comparison is made with prior chest radiograph 01/21/2013 which also demonstrated bilateral right-greater-than-left pleural apical scarring. Allowing for differences in technique, this pleural thickening is probably unchanged compared to prior exam. Although neoplasm is impossible to exclude, it is considered unlikely given the stability to prior chest radiograph.  Central canal is grossly patent. There are disc protrusions and disc osteophyte complexes in the central canal the produce mild stenosis, notably at C2-C3, C3-C4 and most prominently at C4-C5. There is also posterior ligamentum flavum redundancy. C7-T1 predominant facet arthrosis.  IMPRESSION: 1. No acute intracranial abnormality. Atrophy and chronic ischemic white matter disease. 2. No cervical spine fracture or dislocation. 3. C5 through C7 ACDF. Loosening of the C7 ACDF screws with some incorporation of interbody bone graft. Findings are consistent with at least delayed union. 4. Asymmetric pleural apical scarring in the lungs, greater on the RIGHT than LEFT. Grossly, this appears unchanged from prior chest radiograph from 2014, most compatible with scarring although the appearance is nonspecific.   Electronically Signed   By: Andreas NewportGeoffrey  Lamke M.D.   On: 08/17/2014 13:40   Ct Cervical Spine Wo Contrast  08/17/2014   CLINICAL DATA:  FALL posterior head trauma. Cervicalgia/neck pain. Bathtub injury. RIGHT shoulder pain. cervicalgia/neck pain.  EXAM: CT HEAD WITHOUT CONTRAST  CT CERVICAL SPINE WITHOUT CONTRAST  TECHNIQUE: Multidetector CT imaging of the head and cervical spine was  performed following the standard protocol without intravenous contrast. Multiplanar CT image reconstructions of the cervical spine were also generated.  COMPARISON:  Chest radiograph 01/21/2013.  FINDINGS: CT HEAD FINDINGS  No mass lesion, mass effect, midline shift, hydrocephalus, hemorrhage. No acute territorial cortical ischemia/infarct. Atrophy and chronic ischemic white matter disease is present. Scout image appears within normal limits. Intracranial atherosclerosis is present.  CT CERVICAL SPINE FINDINGS  Alignment: Cervical collar is present.  The alignment is anatomic.  Craniocervical junction: Atlantodental degenerative disease. Occipital condyles appear intact. Odontoid intact. The C1 ring is within normal limits.  Vertebrae: C5 through C7 ACDF. Good incorporation of interbody bone graft at C5-C6 with partial incorporation C6 - C7. There is lucency around the screws at C7 compatible with ongoing motion mechanical loosening. Although screws do appear to  be loosening, there also appears to be a tiny amount of bridging bone across the disc space suggesting that fusion will proceed. No destructive osseous lesions. No fracture.  Paraspinal soft tissues: Atherosclerosis.  Lung apices: Right-greater-than-left pleural apical scarring is present. Comparison is made with prior chest radiograph 01/21/2013 which also demonstrated bilateral right-greater-than-left pleural apical scarring. Allowing for differences in technique, this pleural thickening is probably unchanged compared to prior exam. Although neoplasm is impossible to exclude, it is considered unlikely given the stability to prior chest radiograph.  Central canal is grossly patent. There are disc protrusions and disc osteophyte complexes in the central canal the produce mild stenosis, notably at C2-C3, C3-C4 and most prominently at C4-C5. There is also posterior ligamentum flavum redundancy. C7-T1 predominant facet arthrosis.  IMPRESSION: 1. No acute  intracranial abnormality. Atrophy and chronic ischemic white matter disease. 2. No cervical spine fracture or dislocation. 3. C5 through C7 ACDF. Loosening of the C7 ACDF screws with some incorporation of interbody bone graft. Findings are consistent with at least delayed union. 4. Asymmetric pleural apical scarring in the lungs, greater on the RIGHT than LEFT. Grossly, this appears unchanged from prior chest radiograph from 2014, most compatible with scarring although the appearance is nonspecific.   Electronically Signed   By: Andreas Newport M.D.   On: 08/17/2014 13:40     EKG Interpretation   Date/Time:  Wednesday August 17 2014 13:31:30 EDT Ventricular Rate:  82 PR Interval:  160 QRS Duration: 64 QT Interval:  372 QTC Calculation: 434 R Axis:   67 Text Interpretation:  Normal sinus rhythm Septal infarct , age  undetermined Abnormal ECG T waves  now upright Confirmed by Manus Gunning  MD,  Anysha Frappier [phone]) on 08/17/2014 1:35:26 PM      MDM   Final diagnoses:  Fall, initial encounter  Low back pain without sciatica, unspecified back pain laterality  AAA (abdominal aortic aneurysm)   Mechanical fall 2 days ago falling backwards and hitting her head, neck and back in her bathtub. Sent from urgent care today. Denies weakness, numbness or tingling. Denies blood thinner use. Complains of pain in her head, neck and upper back.  Neurovascularly intact. We'll obtain CT head, C-spine, plain films of low back. Patient hypertensive. States she is nervous normally does not take blood pressure medication. Denies chest pain.  Loosening of cervical spine hardware discussed with Dr. Mikal Plane. Patient with cervical fusion 5 years ago in [place]. Patient is stable. Dr. Mikal Plane states patient does not need cervical collar unless she requests 1  Incidental finding of AAA on x-ray. CT was obtained to compare to previous imaging. Mildly enlarged compared to previous study 1 year ago. 50 Millimeters compared to  47. Discussed with Dr. Hart [place]. He recommends follow-up in the office in 3 months for CTA. He took patient's information.  Patient is able to ambulate without assistance. She is tolerating by mouth. She understands she needs follow-up with vascular surgery. Instructed to use Tylenol or ibuprofen as needed for pain. Return precautions discussed.  BP 115/77 mmHg  Pulse 77  Temp(Src) 98 F (36.7 C) (Oral)  Resp 20  Ht 5' (1.524 m)  SpO2 100%   I personally performed the services described in this documentation, which was scribed in my presence. The recorded information has been reviewed and is accurate.   Tonya Octave, MD 08/17/14 [phone]

## 2014-08-17 NOTE — ED Notes (Signed)
Patient transported to X-ray 

## 2014-08-17 NOTE — Discharge Instructions (Signed)
Abdominal Aortic Aneurysm you will be called by the vascular surgeon to schedule a visit to address your aneurysm. Return to the ED if you develop severe chest, back or abdominal pain or any other concerns. An aneurysm is a weakened or damaged part of an artery wall that bulges from the normal force of blood pumping through the body. An abdominal aortic aneurysm is an aneurysm that occurs in the lower part of the aorta, the main artery of the body.  The major concern with an abdominal aortic aneurysm is that it can enlarge and burst (rupture) or blood can flow between the layers of the wall of the aorta through a tear (aorticdissection). Both of these conditions can cause bleeding inside the body and can be life threatening unless diagnosed and treated promptly. CAUSES  The exact cause of an abdominal aortic aneurysm is unknown. Some contributing factors are:   A hardening of the arteries caused by the buildup of fat and other substances in the lining of a blood vessel (arteriosclerosis).  Inflammation of the walls of an artery (arteritis).   Connective tissue diseases, such as Marfan syndrome.   Abdominal trauma.   An infection, such as syphilis or staphylococcus, in the wall of the aorta (infectious aortitis) caused by bacteria. RISK FACTORS  Risk factors that contribute to an abdominal aortic aneurysm may include:  Age older than 60 years.   High blood pressure (hypertension).  Female gender.  Ethnicity (white race).  Obesity.  Family history of aneurysm (first degree relatives only).  Tobacco use. PREVENTION  The following healthy lifestyle habits may help decrease your risk of abdominal aortic aneurysm:  Quitting smoking. Smoking can raise your blood pressure and cause arteriosclerosis.  Limiting or avoiding alcohol.  Keeping your blood pressure, blood sugar level, and cholesterol levels within normal limits.  Decreasing your salt intake. In somepeople, too much salt  can raise blood pressure and increase your risk of abdominal aortic aneurysm.  Eating a diet low in saturated fats and cholesterol.  Increasing your fiber intake by including whole grains, vegetables, and fruits in your diet. Eating these foods may help lower blood pressure.  Maintaining a healthy weight.  Staying physically active and exercising regularly. SYMPTOMS  The symptoms of abdominal aortic aneurysm may vary depending on the size and rate of growth of the aneurysm.Most grow slowly and do not have any symptoms. When symptoms do occur, they may include:  Pain (abdomen, side, lower back, or groin). The pain may vary in intensity. A sudden onset of severe pain may indicate that the aneurysm has ruptured.  Feeling full after eating only small amounts of food.  Nausea or vomiting or both.  Feeling a pulsating lump in the abdomen.  Feeling faint or passing out. DIAGNOSIS  Since most unruptured abdominal aortic aneurysms have no symptoms, they are often discovered during diagnostic exams for other conditions. An aneurysm may be found during the following procedures:  Ultrasonography (A one-time screening for abdominal aortic aneurysm by ultrasonography is also recommended for all men aged 65-75 years who have ever smoked).  X-ray exams.  A computed tomography (CT).  Magnetic resonance imaging (MRI).  Angiography or arteriography. TREATMENT  Treatment of an abdominal aortic aneurysm depends on the size of your aneurysm, your age, and risk factors for rupture. Medication to control blood pressure and pain may be used to manage aneurysms smaller than 6 cm. Regular monitoring for enlargement may be recommended by your caregiver if:  The aneurysm is 3-4 cm  in size (an annual ultrasonography may be recommended).  The aneurysm is 4-4.5 cm in size (an ultrasonography every 6 months may be recommended).  The aneurysm is larger than 4.5 cm in size (your caregiver may ask that you be  examined by a vascular surgeon). If your aneurysm is larger than 6 cm, surgical repair may be recommended. There are two main methods for repair of an aneurysm:   Endovascular repair (a minimally invasive surgery). This is done most often.  Open repair. This method is used if an endovascular repair is not possible. Document Released: 01/30/2005 Document Revised: 08/17/2012 Document Reviewed: 05/22/2012 [facility] Patient Information 2015 [place], [place]. This information is not intended to replace advice given to you by your health care provider. Make sure you discuss any questions you have with your health care provider. Fall Prevention and Home Safety Falls cause injuries and can affect all age groups. It is possible to use preventive measures to significantly decrease the likelihood of falls. There are many simple measures which can make your home safer and prevent falls. OUTDOORS  Repair cracks and edges of walkways and driveways.  Remove high doorway thresholds.  Trim shrubbery on the main path into your home.  Have good outside lighting.  Clear walkways of tools, rocks, debris, and clutter.  Check that handrails are not broken and are securely fastened. Both sides of steps should have handrails.  Have leaves, snow, and ice cleared regularly.  Use sand or salt on walkways during winter months.  In the garage, clean up grease or oil spills. BATHROOM  Install night lights.  Install grab bars by the toilet and in the tub and shower.  Use non-skid mats or decals in the tub or shower.  Place a plastic non-slip stool in the shower to sit on, if needed.  Keep floors dry and clean up all water on the floor immediately.  Remove soap buildup in the tub or shower on a regular basis.  Secure bath mats with non-slip, double-sided rug tape.  Remove throw rugs and tripping hazards from the floors. BEDROOMS  Install night lights.  Make sure a bedside light is easy to reach.  Do  not use oversized bedding.  Keep a telephone by your bedside.  Have a firm chair with side arms to use for getting dressed.  Remove throw rugs and tripping hazards from the floor. KITCHEN  Keep handles on pots and pans turned toward the center of the stove. Use back burners when possible.  Clean up spills quickly and allow time for drying.  Avoid walking on wet floors.  Avoid hot utensils and knives.  Position shelves so they are not too high or low.  Place commonly used objects within easy reach.  If necessary, use a sturdy step stool with a grab bar when reaching.  Keep electrical cables out of the way.  Do not use floor polish or wax that makes floors slippery. If you must use wax, use non-skid floor wax.  Remove throw rugs and tripping hazards from the floor. STAIRWAYS  Never leave objects on stairs.  Place handrails on both sides of stairways and use them. Fix any loose handrails. Make sure handrails on both sides of the stairways are as long as the stairs.  Check carpeting to make sure it is firmly attached along stairs. Make repairs to worn or loose carpet promptly.  Avoid placing throw rugs at the top or bottom of stairways, or properly secure the rug with carpet tape to  prevent slippage. Get rid of throw rugs, if possible.  Have an electrician put in a light switch at the top and bottom of the stairs. OTHER FALL PREVENTION TIPS  Wear low-heel or rubber-soled shoes that are supportive and fit well. Wear closed toe shoes.  When using a stepladder, make sure it is fully opened and both spreaders are firmly locked. Do not climb a closed stepladder.  Add color or contrast paint or tape to grab bars and handrails in your home. Place contrasting color strips on first and last steps.  Learn and use mobility aids as needed. Install an electrical emergency response system.  Turn on lights to avoid dark areas. Replace light bulbs that burn out immediately. Get light  switches that glow.  Arrange furniture to create clear pathways. Keep furniture in the same place.  Firmly attach carpet with non-skid or double-sided tape.  Eliminate uneven floor surfaces.  Select a carpet pattern that does not visually hide the edge of steps.  Be aware of all pets. OTHER HOME SAFETY TIPS  Set the water temperature for 120 F (48.8 C).  Keep emergency numbers on or near the telephone.  Keep smoke detectors on every level of the home and near sleeping areas. Document Released: 04/12/2002 Document Revised: 10/22/2011 Document Reviewed: 07/12/2011 [facility] Patient Information 2015 [place], [place]. This information is not intended to replace advice given to you by your health care provider. Make sure you discuss any questions you have with your health care provider.

## 2014-08-17 NOTE — ED Notes (Signed)
Patient transported to CT 

## 2014-08-17 NOTE — ED Notes (Signed)
MD at bedside. 

## 2014-08-17 NOTE — ED Notes (Signed)
MD at bedside. Spinal immobilizer removed
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
## 2014-11-16 ENCOUNTER — Other Ambulatory Visit: Payer: PRIVATE HEALTH INSURANCE

## 2014-11-22 ENCOUNTER — Ambulatory Visit: Payer: PRIVATE HEALTH INSURANCE | Admitting: Vascular Surgery

## 2015-02-25 ENCOUNTER — Emergency Department (HOSPITAL_COMMUNITY)
Admission: EM | Admit: 2015-02-25 | Discharge: 2015-02-28 | Disposition: A | Discharge status: Other Acute Inpatient Hospital | Payer: Medicare Other | Attending: Emergency Medicine | Admitting: Emergency Medicine

## 2015-02-25 ENCOUNTER — Encounter (HOSPITAL_COMMUNITY): Payer: Self-pay | Admitting: Emergency Medicine

## 2015-02-25 ENCOUNTER — Emergency Department (HOSPITAL_COMMUNITY): Payer: Medicare Other

## 2015-02-25 DIAGNOSIS — Y9289 Other specified places as the place of occurrence of the external cause: Secondary | ICD-10-CM | POA: Diagnosis not present

## 2015-02-25 DIAGNOSIS — F329 Major depressive disorder, single episode, unspecified: Secondary | ICD-10-CM | POA: Insufficient documentation

## 2015-02-25 DIAGNOSIS — Y9389 Activity, other specified: Secondary | ICD-10-CM | POA: Diagnosis not present

## 2015-02-25 DIAGNOSIS — S61512A Laceration without foreign body of left wrist, initial encounter: Secondary | ICD-10-CM | POA: Diagnosis not present

## 2015-02-25 DIAGNOSIS — S61511A Laceration without foreign body of right wrist, initial encounter: Secondary | ICD-10-CM | POA: Diagnosis not present

## 2015-02-25 DIAGNOSIS — Z8673 Personal history of transient ischemic attack (TIA), and cerebral infarction without residual deficits: Secondary | ICD-10-CM | POA: Diagnosis not present

## 2015-02-25 DIAGNOSIS — Z720 Tobacco use: Secondary | ICD-10-CM | POA: Insufficient documentation

## 2015-02-25 DIAGNOSIS — F419 Anxiety disorder, unspecified: Secondary | ICD-10-CM | POA: Insufficient documentation

## 2015-02-25 DIAGNOSIS — Y998 Other external cause status: Secondary | ICD-10-CM | POA: Insufficient documentation

## 2015-02-25 DIAGNOSIS — Z79899 Other long term (current) drug therapy: Secondary | ICD-10-CM | POA: Diagnosis not present

## 2015-02-25 DIAGNOSIS — F32A Depression, unspecified: Secondary | ICD-10-CM

## 2015-02-25 DIAGNOSIS — T1491 Suicide attempt: Secondary | ICD-10-CM | POA: Diagnosis not present

## 2015-02-25 DIAGNOSIS — Z8619 Personal history of other infectious and parasitic diseases: Secondary | ICD-10-CM | POA: Insufficient documentation

## 2015-02-25 DIAGNOSIS — W260XXA Contact with knife, initial encounter: Secondary | ICD-10-CM | POA: Insufficient documentation

## 2015-02-25 DIAGNOSIS — Z8719 Personal history of other diseases of the digestive system: Secondary | ICD-10-CM | POA: Insufficient documentation

## 2015-02-25 DIAGNOSIS — J45909 Unspecified asthma, uncomplicated: Secondary | ICD-10-CM | POA: Insufficient documentation

## 2015-02-25 DIAGNOSIS — R45851 Suicidal ideations: Secondary | ICD-10-CM | POA: Diagnosis present

## 2015-02-25 DIAGNOSIS — T1491XA Suicide attempt, initial encounter: Secondary | ICD-10-CM

## 2015-02-25 HISTORY — DX: Irritable bowel syndrome, unspecified: K58.9

## 2015-02-25 HISTORY — DX: Candidal stomatitis: B37.0

## 2015-02-25 LAB — COMPREHENSIVE METABOLIC PANEL
ALK PHOS: 49 U/L (ref 38–126)
ALT: 6 U/L — AB (ref 14–54)
AST: 29 U/L (ref 15–41)
Albumin: 3.4 g/dL — ABNORMAL LOW (ref 3.5–5.0)
Anion gap: 12 (ref 5–15)
BUN: 8 mg/dL (ref 6–20)
CALCIUM: 9.1 mg/dL (ref 8.9–10.3)
CO2: 25 mmol/L (ref 22–32)
CREATININE: 0.8 mg/dL (ref 0.44–1.00)
Chloride: 90 mmol/L — ABNORMAL LOW (ref 101–111)
GFR calc Af Amer: >60 mL/min (ref >60)
GFR calc non Af Amer: >60 mL/min (ref >60)
Glucose, Bld: 90 mg/dL (ref 65–99)
Potassium: 4.4 mmol/L (ref 3.5–5.1)
Sodium: 127 mmol/L — ABNORMAL LOW (ref 135–145)
TOTAL PROTEIN: 6.1 g/dL — AB (ref 6.5–8.1)
Total Bilirubin: 0.9 mg/dL (ref 0.3–1.2)

## 2015-02-25 LAB — TYPE AND SCREEN
ABO/RH(D): A POS
Antibody Screen: NEGATIVE

## 2015-02-25 LAB — URINALYSIS, ROUTINE W REFLEX MICROSCOPIC
BILIRUBIN URINE: NEGATIVE
Glucose, UA: NEGATIVE mg/dL
KETONES UR: NEGATIVE mg/dL
Leukocytes, UA: NEGATIVE
NITRITE: NEGATIVE
PH: 7 (ref 5.0–8.0)
Protein, ur: NEGATIVE mg/dL
Specific Gravity, Urine: 1.006 (ref 1.005–1.030)
UROBILINOGEN UA: 0.2 mg/dL (ref 0.0–1.0)

## 2015-02-25 LAB — PROTIME-INR
INR: 0.97 (ref 0.00–1.49)
PROTHROMBIN TIME: 13.1 s (ref 11.6–15.2)

## 2015-02-25 LAB — RAPID URINE DRUG SCREEN, HOSP PERFORMED
AMPHETAMINES: NOT DETECTED
BARBITURATES: NOT DETECTED
Benzodiazepines: NOT DETECTED
COCAINE: NOT DETECTED
OPIATES: NOT DETECTED
TETRAHYDROCANNABINOL: NOT DETECTED

## 2015-02-25 LAB — CBC WITH DIFFERENTIAL/PLATELET
Basophils Absolute: 0 10*3/uL (ref 0.0–0.1)
Basophils Relative: 0 %
EOS ABS: 0.1 10*3/uL (ref 0.0–0.7)
EOS PCT: 1 %
HEMATOCRIT: 37.1 % (ref 36.0–46.0)
HEMOGLOBIN: 13.2 g/dL (ref 12.0–15.0)
LYMPHS ABS: 1.2 10*3/uL (ref 0.7–4.0)
Lymphocytes Relative: 13 %
MCH: 33.8 pg (ref 26.0–34.0)
MCHC: 35.6 g/dL (ref 30.0–36.0)
MCV: 95.1 fL (ref 78.0–100.0)
MONOS PCT: 12 %
Monocytes Absolute: 1.2 10*3/uL — ABNORMAL HIGH (ref 0.1–1.0)
Neutro Abs: 7.1 10*3/uL (ref 1.7–7.7)
Neutrophils Relative %: 74 %
Platelets: 276 10*3/uL (ref 150–400)
RBC: 3.9 MIL/uL (ref 3.87–5.11)
RDW: 12.1 % (ref 11.5–15.5)
WBC: 9.7 10*3/uL (ref 4.0–10.5)

## 2015-02-25 LAB — ACETAMINOPHEN LEVEL: Acetaminophen (Tylenol), Serum: <10 ug/mL — ABNORMAL LOW (ref 10–30)

## 2015-02-25 LAB — SALICYLATE LEVEL: Salicylate Lvl: <4 mg/dL (ref 2.8–30.0)

## 2015-02-25 LAB — URINE MICROSCOPIC-ADD ON

## 2015-02-25 LAB — ETHANOL: Alcohol, Ethyl (B): 81 mg/dL — ABNORMAL HIGH (ref <5)

## 2015-02-25 LAB — ABO/RH: ABO/RH(D): A POS

## 2015-02-25 MED ORDER — LORAZEPAM 1 MG PO TABS
1.0000 mg | ORAL_TABLET | Freq: Three times a day (TID) | ORAL | Status: DC | PRN
  Administered 2015-02-26 – 2015-02-27 (×2): 1 mg via ORAL
  Filled 2015-02-25 (×2): qty 1

## 2015-02-25 MED ORDER — SODIUM CHLORIDE 0.9 % IV BOLUS (SEPSIS): 500.0000 mL | Freq: Once | INTRAVENOUS | Status: DC

## 2015-02-25 MED ORDER — CEPHALEXIN 250 MG PO CAPS
500.0000 mg | ORAL_CAPSULE | Freq: Once | ORAL | Status: AC
  Administered 2015-02-25: 500 mg via ORAL
  Filled 2015-02-25: qty 2

## 2015-02-25 MED ORDER — HYOSCYAMINE SULFATE 0.125 MG PO TBDP
0.1250 mg | ORAL_TABLET | Freq: Four times a day (QID) | ORAL | Status: DC | PRN
  Administered 2015-02-26 – 2015-02-27 (×2): 0.125 mg via SUBLINGUAL
  Filled 2015-02-25 (×3): qty 1

## 2015-02-25 MED ORDER — TRAZODONE HCL 100 MG PO TABS
100.0000 mg | ORAL_TABLET | Freq: Every day | ORAL | Status: DC
  Administered 2015-02-25 – 2015-02-27 (×3): 100 mg via ORAL
  Filled 2015-02-25 (×3): qty 1

## 2015-02-25 MED ORDER — ACETAMINOPHEN 325 MG PO TABS
650.0000 mg | ORAL_TABLET | ORAL | Status: DC | PRN
  Administered 2015-02-26 – 2015-02-27 (×2): 650 mg via ORAL
  Filled 2015-02-25 (×2): qty 2

## 2015-02-25 MED ORDER — NICOTINE 21 MG/24HR TD PT24
21.0000 mg | MEDICATED_PATCH | Freq: Every day | TRANSDERMAL | Status: DC
  Administered 2015-02-25 – 2015-02-27 (×3): 21 mg via TRANSDERMAL
  Filled 2015-02-25 (×3): qty 1

## 2015-02-25 MED ORDER — NYSTATIN 100000 UNIT/ML MT SUSP
5.0000 mL | Freq: Three times a day (TID) | OROMUCOSAL | Status: DC
  Administered 2015-02-25 – 2015-02-28 (×9): 500000 [IU] via ORAL
  Filled 2015-02-25 (×17): qty 5

## 2015-02-25 MED ORDER — LIDOCAINE-EPINEPHRINE 2 %-1:200000 IJ SOLN
20.0000 mL | Freq: Once | INTRAMUSCULAR | Status: AC
Start: 2015-02-25 — End: 2015-02-25
  Administered 2015-02-25: 20 mL
  Filled 2015-02-25: qty 20

## 2015-02-25 MED ORDER — ZOLPIDEM TARTRATE 5 MG PO TABS
5.0000 mg | ORAL_TABLET | Freq: Every evening | ORAL | Status: DC | PRN
  Administered 2015-02-27: 5 mg via ORAL
  Filled 2015-02-25: qty 1

## 2015-02-25 MED ORDER — IBUPROFEN 400 MG PO TABS
600.0000 mg | ORAL_TABLET | Freq: Three times a day (TID) | ORAL | Status: DC | PRN
  Administered 2015-02-26 – 2015-02-27 (×2): 600 mg via ORAL
  Filled 2015-02-25 (×2): qty 3
  Filled 2015-02-25 (×2): qty 1

## 2015-02-25 MED ORDER — LORAZEPAM 1 MG PO TABS
1.0000 mg | ORAL_TABLET | Freq: Once | ORAL | Status: AC
  Administered 2015-02-25: 1 mg via ORAL
  Filled 2015-02-25: qty 1

## 2015-02-25 MED ORDER — ONDANSETRON HCL 4 MG PO TABS
4.0000 mg | ORAL_TABLET | Freq: Three times a day (TID) | ORAL | Status: DC | PRN
  Administered 2015-02-25 – 2015-02-26 (×2): 4 mg via ORAL
  Filled 2015-02-25 (×2): qty 1

## 2015-02-25 NOTE — ED Notes (Signed)
Staffing called for sitter 6:02

## 2015-02-25 NOTE — ED Notes (Signed)
TTS in process 

## 2015-02-25 NOTE — ED Notes (Signed)
Patient transported to X-ray 
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
## 2015-02-25 NOTE — BH Assessment (Signed)
2144:  Consulted with Marlon Peliffany Greene, PA-C.  Reports Patient consumed alcohol, slit her wrist, and called a Friend.  Reports Patient is unable to drive due to a medical condition, lives, alone, does not have many Friends.

## 2015-02-25 NOTE — ED Provider Notes (Signed)
CSN: [id number]     Arrival date & time 02/25/15  1733 History   First MD Initiated Contact with Patient 02/25/15 1757     No chief complaint on file.    (Consider location/radiation/quality/duration/timing/severity/associated sxs/prior Treatment) HPI   Patient is here to the ER after a suicide attempt brought in by EMS. She has a past medical history of anxiety, depression, TIA, IBS, oral thrush, COPD. She lacerated her wrists bilaterally using a box cutter knife. She reports thinking about and planning it for a month. Drank a bunch of alcohol, cut her wrists and then waited in the tub to die. She called her friend in [place] to say good bye and they called [employer]. The patient is very upset that her friend called the police and is upset that she didn't "finish" the job. She has an adopted son because she could not have kids that is now 19 years old and lives in [place], she moved here from [place] while ago to be closer to him but didn't realize she wouldn't like his wife. They borrowed money and never paid her back. She has an essential tremor and is unable to drive, lives alone and is very lonely. Reports her husband left her after they adopted her son when he was 5 due to deciding he was gay, left for another man and has since I believe she said passed away. She denies drug use or doing anything else to harm herself. UTD on TETANUS  Past Medical History  Diagnosis Date  . COPD (chronic obstructive pulmonary disease) (HCC)   . Anxiety   . Depression   . TIA (transient ischemic attack)   . IBS (irritable bowel syndrome)   . Oral thrush    Past Surgical History  Procedure Laterality Date  . Back surgery    . Neck surgery    . Colectomy     No family history on file. Social History  Substance Use Topics  . Smoking status: Current Every Day Smoker -- 0.50 packs/day    Types: Cigarettes  . Smokeless tobacco: Never Used  . Alcohol Use: 1.2 oz/week    2 Glasses of wine per week    OB History    No data available     Review of Systems  10 Systems reviewed and are negative for acute change except as noted in the HPI.     Allergies  Review of patient's allergies indicates no known allergies.  Home Medications   Prior to Admission medications   Medication Sig Start Date End Date Taking? Authorizing Provider  hyoscyamine (ANASPAZ) 0.125 MG TBDP disintergrating tablet Place 0.125 mg under the tongue every 6 (six) hours as needed for cramping.   Yes Historical Provider, MD  nystatin (MYCOSTATIN) 100000 UNIT/ML suspension Take 5 mLs by mouth 4 (four) times daily - after meals and at bedtime. 02/24/15  Yes Historical Provider, MD  traZODone (DESYREL) 100 MG tablet Take 100 mg by mouth at bedtime.   Yes Historical Provider, MD   BP 146/60 mmHg  Pulse 91  Ht 5' (1.524 m)  Wt 80 lb (36.288 kg)  BMI 15.62 kg/m2  SpO2 99% Physical Exam  Constitutional: She appears well-developed and well-nourished. No distress.  HENT:  Head: Normocephalic and atraumatic.  Eyes: Pupils are equal, round, and reactive to light.  Neck: Normal range of motion. Neck supple.  Cardiovascular: Normal rate and regular rhythm.   Pulmonary/Chest: Effort normal.  Abdominal: Soft.  Neurological: She is alert.  Cranial nerves II-VIII  and X-XII evaluated and show no deficits. Pt alert and oriented x 3 Upper and lower extremity strength is symmetrical and physiologic Normal muscular tone No facial droop Coordination intact, no limb ataxia,  + chronic essential tremor.   Skin: Skin is warm and dry.  Bilateral anterior distal wrist lacerations. Both approx 3 inches in length. [employer] has her in bilateral wrist tourniquets. When removed she has no bleeding and the blood has coagulated. No tendons or arteries visualized. She has FROM of wrist and all 5 fingers. CR < 2 seconds in all 10 fingers. Sensations are intact.  Nursing note and vitals reviewed.   ED Course  Procedures (including  critical care time) Labs Review Labs Reviewed  CBC WITH DIFFERENTIAL/PLATELET - Abnormal; Notable for the following:    Monocytes Absolute 1.2 (*)    All other components within normal limits  COMPREHENSIVE METABOLIC PANEL - Abnormal; Notable for the following:    Sodium 127 (*)    Chloride 90 (*)    Total Protein 6.1 (*)    Albumin 3.4 (*)    ALT 6 (*)    All other components within normal limits  ETHANOL - Abnormal; Notable for the following:    Alcohol, Ethyl (B) 81 (*)    All other components within normal limits  ACETAMINOPHEN LEVEL - Abnormal; Notable for the following:    Acetaminophen (Tylenol), Serum <10 (*)    All other components within normal limits  URINALYSIS, ROUTINE W REFLEX MICROSCOPIC (NOT AT [facility]) - Abnormal; Notable for the following:    Hgb urine dipstick TRACE (*)    All other components within normal limits  URINE MICROSCOPIC-ADD ON - Abnormal; Notable for the following:    Squamous Epithelial / LPF FEW (*)    All other components within normal limits  PROTIME-INR  SALICYLATE LEVEL  URINE RAPID DRUG SCREEN, HOSP PERFORMED  TYPE AND SCREEN  ABO/RH    Imaging Review Dg Wrist Complete Left  02/25/2015  CLINICAL DATA:  Suicide attempt, cut wrists with box cutter EXAM: LEFT WRIST - COMPLETE 3+ VIEW COMPARISON:  None. FINDINGS: No fracture or dislocation is seen. The joint spaces are preserved. Soft tissue swelling/laceration along the volar aspect of the wrist. No radiopaque foreign body is seen. IMPRESSION: Soft tissue swelling/laceration along the volar aspect of the wrist. No fracture, dislocation, or radiopaque foreign body is seen. Electronically Signed   By: Charline BillsSriyesh  Krishnan M.D.   On: 02/25/2015 18:55   Dg Wrist Complete Right  02/25/2015  CLINICAL DATA:  Laceration to wrist.  Attempted suicide. EXAM: RIGHT WRIST - COMPLETE 3+ VIEW COMPARISON:  None. FINDINGS: Soft tissue swelling is present over the ventral aspect of the right wrist. There is no  underlying fracture. Mild osteopenia is present. No radiopaque foreign body is evident. IMPRESSION: 1. Moderate soft swelling along the ventral aspect of the right wrist without underlying fracture. 2. Moderate osteopenia. Electronically Signed   By: Marin Robertshristopher  Mattern M.D.   On: 02/25/2015 18:55   I have personally reviewed and evaluated these images and lab results as part of my medical decision-making.   EKG Interpretation None      MDM   Final diagnoses:  Suicide attempt [facility](HCC)  Depression  Wrist laceration, left, initial encounter  Wrist laceration, right, initial encounter    LACERATION REPAIR Performed by: Dorthula MatasGREENE,Malin Cervini G Authorized by: Dorthula MatasGREENE,Braylon Lemmons G Consent: Verbal consent obtained. Risks and benefits: risks, benefits and alternatives were discussed Consent given by: patient Patient identity confirmed: provided demographic data  Prepped and Draped in normal sterile fashion Wound explored  Laceration Location: right wrist  Laceration Length: 3 inches   No Foreign Bodies seen or palpated  Anesthesia: local infiltration  Local anesthetic: lidocaine 2% with epinephrine  Anesthetic total: 1.5  ml  Irrigation method: syringe Amount of cleaning: standard  Skin closure: sutures -- 4'0 prolene  Number of sutures: 4  Technique: simple interrupted  Patient tolerance: Patient tolerated the procedure well with no immediate complications.   LACERATION REPAIR Performed by: Dorthula Matas Authorized by: Dorthula Matas Consent: Verbal consent obtained. Risks and benefits: risks, benefits and alternatives were discussed Consent given by: patient Patient identity confirmed: provided demographic data Prepped and Draped in normal sterile fashion Wound explored  Laceration Location: left wrist  Laceration Length: 3 inches   No Foreign Bodies seen or palpated  Anesthesia: local infiltration  Local anesthetic: lidocaine 2% with epinephrine  Anesthetic  total: 1.5  ml  Irrigation method: syringe Amount of cleaning: standard  Skin closure: sutures -- 4'0 prolene  Number of sutures: 5  Technique: simple interrupted  Patient tolerance: Patient tolerated the procedure well with no immediate complications.    Patient is currently willing to stay voluntarily. She has a normal CBC, PT/INR, acetaminophen, salicylate, and UA.  Her ETOH is elevated at 81 and her CMP shows some signs of dehydration. Will feed patient. She is now medically cleared.   Psych holding orders placed Home meds reviewed and ordered as appropriate TTS consult ordered Considered CIWA protocol and ordered when appropriate. {Patient discussed with Dr. Rhunette Croft.  Filed Vitals:   02/25/15 1817  BP:   Pulse: [address] PA-C 02/25/15 2044  Marlon Pel, PA-C 02/25/15 2135  Derwood Kaplan, MD 02/27/15 2020

## 2015-02-26 DIAGNOSIS — T1491 Suicide attempt: Secondary | ICD-10-CM | POA: Diagnosis not present

## 2015-02-26 LAB — BASIC METABOLIC PANEL
Anion gap: 9 (ref 5–15)
BUN: 6 mg/dL (ref 6–20)
CO2: 27 mmol/L (ref 22–32)
CREATININE: 0.91 mg/dL (ref 0.44–1.00)
Calcium: 9.1 mg/dL (ref 8.9–10.3)
Chloride: 91 mmol/L — ABNORMAL LOW (ref 101–111)
GFR calc Af Amer: >60 mL/min (ref >60)
GFR, EST NON AFRICAN AMERICAN: 59 mL/min — AB (ref >60)
Glucose, Bld: 107 mg/dL — ABNORMAL HIGH (ref 65–99)
Potassium: 4 mmol/L (ref 3.5–5.1)
SODIUM: 127 mmol/L — AB (ref 135–145)

## 2015-02-26 NOTE — ED Provider Notes (Signed)
  Physical Exam  BP 171/82 mmHg  Pulse 92  Resp 16  Ht 5' (1.524 m)  Wt 80 lb (36.288 kg)  BMI 15.62 kg/m2  SpO2 96%  Physical Exam  ED Course  Procedures  MDM Patient came in yesterday with suicidal attempt. Hyponatremic to 127 in the ED yesterday. Repeat was stable. Has hx of hyponatremia and baseline was around 133. Likely from dehydration and alcohol use. Started on home meds. Psych to admit. Medically cleared.      Richardean Canalavid H Symphanie Cederberg, MD 02/26/15 [phone]

## 2015-02-26 NOTE — ED Notes (Signed)
Pt is asleep during snack time will wait until she wakes up.

## 2015-02-26 NOTE — ED Notes (Signed)
Pt requesting Anaspaz - advised will order from pharmacy.

## 2015-02-26 NOTE — ED Notes (Signed)
This RN gave additional information to [facility], including vitals.

## 2015-02-26 NOTE — ED Notes (Signed)
States lives alone and has a female friend who helps her w/her groceries. States no longer wishes to live alone when is d/c'd from behavioral health facility.

## 2015-02-26 NOTE — BHH Counselor (Signed)
0/23/16 [facility] to [facility], [facility], [facility], [facility], [facility], [facility]  Ozan Maclay K. Loni Abdon,LCAS-A, LPC-A, NCC  Counselor 02/26/2015 1:18 AM

## 2015-02-26 NOTE — ED Notes (Signed)
Pt requested apple sauce for snack and water to drink. Pt was given the same.

## 2015-02-26 NOTE — Progress Notes (Signed)
Disposition CSW and TTS completed patient referrals to the following inpatient Geri-Psych facilities:  [facility]  CSW will continue to follow patient for placement needs.  Seward SpeckLeo Lyndy Russman [facility] LPCSW,LCAS Behavioral Health Disposition CSW [phone]

## 2015-02-26 NOTE — ED Notes (Addendum)
Pt c/o abd pain (states has IBS) and requesting sleep med. Also given mesh underwear and pad as requested. States does not drink ETOH daily - states drink 1 glass of wine per week approximately.

## 2015-02-26 NOTE — ED Notes (Signed)
[facility]ative from [facility] called asking about the pt's bed status. This RN directed the rep to call [facility], and gave him the phone number.

## 2015-02-27 DIAGNOSIS — T1491 Suicide attempt: Secondary | ICD-10-CM | POA: Diagnosis not present

## 2015-02-27 MED ORDER — POLYETHYLENE GLYCOL 3350 17 G PO PACK
17.0000 g | PACK | Freq: Every day | ORAL | Status: DC
  Administered 2015-02-27: 17 g via ORAL
  Filled 2015-02-27 (×3): qty 1

## 2015-02-27 NOTE — BH Assessment (Signed)
Informed Dr. Lynelle DoctorKnapp of disposition. TTS will continue to seek placement.

## 2015-02-27 NOTE — ED Notes (Signed)
Son has been updated on pt's plan of care and transport tomorrow morning.

## 2015-02-27 NOTE — ED Notes (Signed)
Bedside commode placed at bedside.  

## 2015-02-27 NOTE — BH Assessment (Signed)
Spoke with Christiane HaJonathan from [facility] and he apologize and reported that he made a mistake and pt will be placed on the wait list.

## 2015-02-27 NOTE — ED Notes (Signed)
Patient's nursing assistant, Selena BattenKim called to speak with patient.   Patient currently asleep.  I did take a message for patient to callback.   [phone].

## 2015-02-27 NOTE — ED Notes (Signed)
Pt is sleeping at this time.

## 2015-02-27 NOTE — ED Provider Notes (Signed)
I was notified that [facility].  Transfer disposition set but was called back a few minutes later that patient is on the wait list.  She is not ready for transfer  Linwood DibblesJon Revia Nghiem, MD 02/27/15 0050

## 2015-02-27 NOTE — ED Notes (Signed)
[place] Aundra Mil[place]Megan, [place] one place has said they would take patient, but patient would have to be IVC'd to do so. Aundra MilletMegan stated she would talk with Social work and let me know if we need to get paperwork started.

## 2015-02-27 NOTE — ED Notes (Addendum)
Per Launa GrillMegan, BH, [facility] [address] [place]   Dr. Quintin AltoVeser is the accepting doctor.  Wait to call report when transport is ready to take patient.   Call report to [phone].  Aundra MilletMegan wants copy of IVC papers faxed to her at [phone] when served.

## 2015-02-27 NOTE — ED Notes (Addendum)
Bed is no longer available at [facility], so the pt has been place on a wait list.

## 2015-02-27 NOTE — Progress Notes (Signed)
Followed up on inpatient geriatric psychiatric placement efforts.  [facility]- per Sam, pt is on waiting list [facility]- referred and per Britta MccreedyBarbara- there should be one geri female bed later today [facility]- 2 possible beds per Jamesetta SoPhyllis- refaxed referral [facility]- no beds likely today but fax so that intake can have referral on hand [facility]- per Bonita QuinLinda, refax referral, may have open beds later "but there are several referrals ahead of hers" [facility]- per Agustin Creearlene, one possible geri bed  All other geriatric psych units contacted are at capacity currently.  Ilean SkillMeghan Alicia Seib, MSW, LCSW Clinical Social Work, Disposition  02/27/2015 [phone]

## 2015-02-27 NOTE — ED Notes (Signed)
Megan, BH, called to advise that we need to fax her a copy of IVC paperwork once it is delivered.

## 2015-02-27 NOTE — ED Notes (Signed)
Called sheriff's department and left a message about transporting pt. To [facility] in the AM

## 2015-02-27 NOTE — BH Assessment (Signed)
[facility] to the services of Dr. Betti Cruzeddy. Nurse report # [phone]. [facility] 9 am. Informed Dr. Lynelle DoctorKnapp of the disposition.

## 2015-02-27 NOTE — Progress Notes (Signed)
Per Jamesetta SoPhyllis at [facility],  pt is accepted pending pt be IVC'd (due to concerns of safety in light of suicide attempt)- discussed with ED and covering ED CSW assisted EDP in completing IVC paperwork. Once served, disposition CSW will fax [facility] at [phone].  Informed L[facility] at [facility] of pt [facility]C being in progress- she states pt is accepted by Dr. Quintin AltoVeser and can arrive anytime pending service of IVC. Asks that RN call report to [phone] when transportation is ready.   Spoke with [facility] RN re: [facility].  Ilean SkillMeghan Terika Pillard, MSW, LCSW Clinical Social Work, Disposition  02/27/2015 [phone]

## 2015-02-27 NOTE — ED Notes (Signed)
[facility] [place] [facility] called to inform pt will not be transported until the morning due to lack of transporting resources. Pt and charge RN has been informed of the delay.

## 2015-02-27 NOTE — Progress Notes (Addendum)
Patient's [facility]C received and [facility] at [phone].  Per Leotis ShamesLauren at [facility], patient can arrive tomorrow on 10/25 after 8am. RN Minerva AreolaEric aware.  Melbourne Abtsatia Alexah Kivett, LCSWA Disposition staff 02/27/2015 3:55 PM

## 2015-02-27 NOTE — ED Notes (Addendum)
[facility] has been accepted [facility].

## 2015-02-27 NOTE — ED Notes (Signed)
[employer] here to serve patient her IVC paperwork.

## 2015-02-28 DIAGNOSIS — T1491 Suicide attempt: Secondary | ICD-10-CM | POA: Diagnosis not present

## 2015-02-28 NOTE — ED Notes (Signed)
Per [facility] [place], [facility] [phone] - RN to call report when pt leaves.

## 2015-10-05 DIAGNOSIS — I4891 Unspecified atrial fibrillation: Secondary | ICD-10-CM

## 2015-10-05 HISTORY — DX: Unspecified atrial fibrillation: I48.91

## 2015-10-06 ENCOUNTER — Encounter (HOSPITAL_COMMUNITY): Payer: Self-pay | Admitting: Cardiology

## 2015-10-06 ENCOUNTER — Inpatient Hospital Stay (HOSPITAL_COMMUNITY)
Admission: EM | Admit: 2015-10-06 | Discharge: 2015-10-30 | DRG: 231 | Disposition: A | Discharge status: Skilled Nursing Facility | Payer: Medicare Other | Attending: Thoracic Surgery (Cardiothoracic Vascular Surgery) | Admitting: Thoracic Surgery (Cardiothoracic Vascular Surgery)

## 2015-10-06 ENCOUNTER — Ambulatory Visit (HOSPITAL_COMMUNITY): Admit: 2015-10-06 | Payer: Self-pay | Admitting: Cardiology

## 2015-10-06 ENCOUNTER — Encounter (HOSPITAL_COMMUNITY)
Admission: EM | Disposition: A | Discharge status: Skilled Nursing Facility | Payer: Self-pay | Source: Home / Self Care | Attending: Thoracic Surgery (Cardiothoracic Vascular Surgery)

## 2015-10-06 ENCOUNTER — Inpatient Hospital Stay (HOSPITAL_COMMUNITY): Payer: Medicare Other

## 2015-10-06 DIAGNOSIS — I9789 Other postprocedural complications and disorders of the circulatory system, not elsewhere classified: Secondary | ICD-10-CM | POA: Diagnosis not present

## 2015-10-06 DIAGNOSIS — F329 Major depressive disorder, single episode, unspecified: Secondary | ICD-10-CM | POA: Diagnosis present

## 2015-10-06 DIAGNOSIS — F419 Anxiety disorder, unspecified: Secondary | ICD-10-CM | POA: Diagnosis present

## 2015-10-06 DIAGNOSIS — J9811 Atelectasis: Secondary | ICD-10-CM

## 2015-10-06 DIAGNOSIS — Z823 Family history of stroke: Secondary | ICD-10-CM | POA: Diagnosis not present

## 2015-10-06 DIAGNOSIS — I5031 Acute diastolic (congestive) heart failure: Secondary | ICD-10-CM | POA: Diagnosis present

## 2015-10-06 DIAGNOSIS — F17210 Nicotine dependence, cigarettes, uncomplicated: Secondary | ICD-10-CM | POA: Diagnosis present

## 2015-10-06 DIAGNOSIS — N390 Urinary tract infection, site not specified: Secondary | ICD-10-CM

## 2015-10-06 DIAGNOSIS — I714 Abdominal aortic aneurysm, without rupture, unspecified: Secondary | ICD-10-CM | POA: Diagnosis present

## 2015-10-06 DIAGNOSIS — I110 Hypertensive heart disease with heart failure: Secondary | ICD-10-CM | POA: Diagnosis present

## 2015-10-06 DIAGNOSIS — I480 Paroxysmal atrial fibrillation: Secondary | ICD-10-CM | POA: Diagnosis present

## 2015-10-06 DIAGNOSIS — I213 ST elevation (STEMI) myocardial infarction of unspecified site: Secondary | ICD-10-CM

## 2015-10-06 DIAGNOSIS — I2510 Atherosclerotic heart disease of native coronary artery without angina pectoris: Secondary | ICD-10-CM | POA: Diagnosis present

## 2015-10-06 DIAGNOSIS — R079 Chest pain, unspecified: Secondary | ICD-10-CM

## 2015-10-06 DIAGNOSIS — E785 Hyperlipidemia, unspecified: Secondary | ICD-10-CM | POA: Diagnosis present

## 2015-10-06 DIAGNOSIS — T508X5A Adverse effect of diagnostic agents, initial encounter: Secondary | ICD-10-CM | POA: Diagnosis not present

## 2015-10-06 DIAGNOSIS — I25118 Atherosclerotic heart disease of native coronary artery with other forms of angina pectoris: Secondary | ICD-10-CM | POA: Diagnosis not present

## 2015-10-06 DIAGNOSIS — I25119 Atherosclerotic heart disease of native coronary artery with unspecified angina pectoris: Secondary | ICD-10-CM | POA: Diagnosis not present

## 2015-10-06 DIAGNOSIS — E871 Hypo-osmolality and hyponatremia: Secondary | ICD-10-CM | POA: Diagnosis not present

## 2015-10-06 DIAGNOSIS — D62 Acute posthemorrhagic anemia: Secondary | ICD-10-CM | POA: Diagnosis not present

## 2015-10-06 DIAGNOSIS — I420 Dilated cardiomyopathy: Secondary | ICD-10-CM | POA: Diagnosis present

## 2015-10-06 DIAGNOSIS — Z8673 Personal history of transient ischemic attack (TIA), and cerebral infarction without residual deficits: Secondary | ICD-10-CM

## 2015-10-06 DIAGNOSIS — J449 Chronic obstructive pulmonary disease, unspecified: Secondary | ICD-10-CM | POA: Diagnosis present

## 2015-10-06 DIAGNOSIS — Z951 Presence of aortocoronary bypass graft: Secondary | ICD-10-CM

## 2015-10-06 DIAGNOSIS — I25110 Atherosclerotic heart disease of native coronary artery with unstable angina pectoris: Secondary | ICD-10-CM | POA: Diagnosis not present

## 2015-10-06 DIAGNOSIS — I2102 ST elevation (STEMI) myocardial infarction involving left anterior descending coronary artery: Secondary | ICD-10-CM | POA: Diagnosis present

## 2015-10-06 DIAGNOSIS — N179 Acute kidney failure, unspecified: Secondary | ICD-10-CM | POA: Diagnosis not present

## 2015-10-06 DIAGNOSIS — I220 Subsequent ST elevation (STEMI) myocardial infarction of anterior wall: Secondary | ICD-10-CM | POA: Diagnosis present

## 2015-10-06 DIAGNOSIS — K580 Irritable bowel syndrome with diarrhea: Secondary | ICD-10-CM | POA: Diagnosis present

## 2015-10-06 DIAGNOSIS — J90 Pleural effusion, not elsewhere classified: Secondary | ICD-10-CM

## 2015-10-06 DIAGNOSIS — Z9861 Coronary angioplasty status: Secondary | ICD-10-CM

## 2015-10-06 DIAGNOSIS — B9620 Unspecified Escherichia coli [E. coli] as the cause of diseases classified elsewhere: Secondary | ICD-10-CM | POA: Diagnosis present

## 2015-10-06 DIAGNOSIS — I255 Ischemic cardiomyopathy: Secondary | ICD-10-CM | POA: Diagnosis not present

## 2015-10-06 HISTORY — DX: Coronary angioplasty status: Z98.61

## 2015-10-06 HISTORY — PX: CARDIAC CATHETERIZATION: SHX172

## 2015-10-06 HISTORY — DX: Dilated cardiomyopathy: I42.0

## 2015-10-06 HISTORY — DX: Atherosclerotic heart disease of native coronary artery without angina pectoris: I25.10

## 2015-10-06 HISTORY — DX: Hyperlipidemia, unspecified: E78.5

## 2015-10-06 LAB — POCT ACTIVATED CLOTTING TIME: Activated Clotting Time: 358 seconds

## 2015-10-06 LAB — COMPREHENSIVE METABOLIC PANEL
ALBUMIN: 3.6 g/dL (ref 3.5–5.0)
ALK PHOS: 63 U/L (ref 38–126)
ALT: 14 U/L (ref 14–54)
ANION GAP: 11 (ref 5–15)
AST: 30 U/L (ref 15–41)
BUN: 20 mg/dL (ref 6–20)
CALCIUM: 9.2 mg/dL (ref 8.9–10.3)
CO2: 21 mmol/L — ABNORMAL LOW (ref 22–32)
Chloride: 98 mmol/L — ABNORMAL LOW (ref 101–111)
Creatinine, Ser: 1.6 mg/dL — ABNORMAL HIGH (ref 0.44–1.00)
GFR, EST AFRICAN AMERICAN: 34 mL/min — AB (ref >60)
GFR, EST NON AFRICAN AMERICAN: 30 mL/min — AB (ref >60)
Glucose, Bld: 130 mg/dL — ABNORMAL HIGH (ref 65–99)
POTASSIUM: 4.4 mmol/L (ref 3.5–5.1)
Sodium: 130 mmol/L — ABNORMAL LOW (ref 135–145)
TOTAL PROTEIN: 7 g/dL (ref 6.5–8.1)
Total Bilirubin: 0.9 mg/dL (ref 0.3–1.2)

## 2015-10-06 LAB — CBC
HCT: 36.3 % (ref 36.0–46.0)
Hemoglobin: 11.9 g/dL — ABNORMAL LOW (ref 12.0–15.0)
MCH: 30.8 pg (ref 26.0–34.0)
MCHC: 32.8 g/dL (ref 30.0–36.0)
MCV: 94 fL (ref 78.0–100.0)
PLATELETS: 330 10*3/uL (ref 150–400)
RBC: 3.86 MIL/uL — AB (ref 3.87–5.11)
RDW: 13.6 % (ref 11.5–15.5)
WBC: 10.3 10*3/uL (ref 4.0–10.5)

## 2015-10-06 LAB — URINALYSIS, ROUTINE W REFLEX MICROSCOPIC
BILIRUBIN URINE: NEGATIVE
GLUCOSE, UA: NEGATIVE mg/dL
HGB URINE DIPSTICK: NEGATIVE
Ketones, ur: NEGATIVE mg/dL
Nitrite: POSITIVE — AB
Protein, ur: NEGATIVE mg/dL
SPECIFIC GRAVITY, URINE: 1.026 (ref 1.005–1.030)
pH: 6 (ref 5.0–8.0)

## 2015-10-06 LAB — DIFFERENTIAL
BASOS PCT: 0 %
Basophils Absolute: 0 10*3/uL (ref 0.0–0.1)
EOS PCT: 0 %
Eosinophils Absolute: 0 10*3/uL (ref 0.0–0.7)
LYMPHS PCT: 14 %
Lymphs Abs: 1.5 10*3/uL (ref 0.7–4.0)
MONO ABS: 1.5 10*3/uL — AB (ref 0.1–1.0)
Monocytes Relative: 15 %
NEUTROS PCT: 71 %
Neutro Abs: 7.4 10*3/uL (ref 1.7–7.7)

## 2015-10-06 LAB — LIPID PANEL
CHOLESTEROL: 259 mg/dL — AB (ref 0–200)
HDL: 107 mg/dL (ref >40)
LDL CALC: 132 mg/dL — AB (ref 0–99)
TRIGLYCERIDES: 101 mg/dL (ref <150)
Total CHOL/HDL Ratio: 2.4 RATIO
VLDL: 20 mg/dL (ref 0–40)

## 2015-10-06 LAB — PROTIME-INR
INR: 1.02 (ref 0.00–1.49)
PROTHROMBIN TIME: 13.6 s (ref 11.6–15.2)

## 2015-10-06 LAB — PLATELET COUNT: Platelets: 278 10*3/uL (ref 150–400)

## 2015-10-06 LAB — URINE MICROSCOPIC-ADD ON

## 2015-10-06 LAB — TROPONIN I
TROPONIN I: 0.14 ng/mL — AB (ref <0.031)
Troponin I: 0.65 ng/mL (ref <0.031)

## 2015-10-06 LAB — APTT: aPTT: 32 seconds (ref 24–37)

## 2015-10-06 LAB — TSH: TSH: 4.405 u[IU]/mL (ref 0.350–4.500)

## 2015-10-06 LAB — MRSA PCR SCREENING: MRSA by PCR: NEGATIVE

## 2015-10-06 LAB — T4, FREE: FREE T4: 1.12 ng/dL (ref 0.61–1.12)

## 2015-10-06 SURGERY — LEFT HEART CATH AND CORONARY ANGIOGRAPHY
Anesthesia: LOCAL

## 2015-10-06 MED ORDER — FENTANYL CITRATE (PF) 100 MCG/2ML IJ SOLN
INTRAMUSCULAR | Status: DC | PRN
  Administered 2015-10-06 (×2): 25 ug via INTRAVENOUS

## 2015-10-06 MED ORDER — SODIUM CHLORIDE 0.9 % WEIGHT BASED INFUSION: 1.0000 mL/kg/h | INTRAVENOUS | Status: AC

## 2015-10-06 MED ORDER — ACETAMINOPHEN 325 MG PO TABS: 650.0000 mg | ORAL_TABLET | ORAL | Status: DC | PRN

## 2015-10-06 MED ORDER — HEPARIN (PORCINE) IN NACL 2-0.9 UNIT/ML-% IJ SOLN
INTRAMUSCULAR | Status: DC | PRN
  Administered 2015-10-06: 1500 mL

## 2015-10-06 MED ORDER — SODIUM CHLORIDE 0.9 % IV SOLN: 250.0000 mL | INTRAVENOUS | Status: DC | PRN

## 2015-10-06 MED ORDER — MIDAZOLAM HCL 2 MG/2ML IJ SOLN
INTRAMUSCULAR | Status: DC | PRN
  Administered 2015-10-06: 1 mg via INTRAVENOUS

## 2015-10-06 MED ORDER — ONDANSETRON HCL 4 MG/2ML IJ SOLN
INTRAMUSCULAR | Status: DC | PRN
  Administered 2015-10-06: 4 mg via INTRAVENOUS

## 2015-10-06 MED ORDER — VERAPAMIL HCL 2.5 MG/ML IV SOLN
INTRA_ARTERIAL | Status: DC | PRN
  Administered 2015-10-06: 15:00:00 via INTRA_ARTERIAL

## 2015-10-06 MED ORDER — SODIUM CHLORIDE 0.9 % IV SOLN: INTRAVENOUS | Status: DC

## 2015-10-06 MED ORDER — ALPRAZOLAM 0.25 MG PO TABS
0.2500 mg | ORAL_TABLET | Freq: Two times a day (BID) | ORAL | Status: DC | PRN
  Administered 2015-10-08 – 2015-10-12 (×7): 0.25 mg via ORAL
  Filled 2015-10-06 (×7): qty 1

## 2015-10-06 MED ORDER — ASPIRIN EC 81 MG PO TBEC: 81.0000 mg | DELAYED_RELEASE_TABLET | Freq: Every day | ORAL | Status: DC

## 2015-10-06 MED ORDER — TIROFIBAN HCL IN NACL 5-0.9 MG/100ML-% IV SOLN
INTRAVENOUS | Status: DC | PRN
  Administered 2015-10-06: 0.075 ug/kg/min via INTRAVENOUS

## 2015-10-06 MED ORDER — LIDOCAINE HCL (PF) 1 % IJ SOLN
INTRAMUSCULAR | Status: DC | PRN
  Administered 2015-10-06: 2 mL via SUBCUTANEOUS

## 2015-10-06 MED ORDER — ZOLPIDEM TARTRATE 5 MG PO TABS
5.0000 mg | ORAL_TABLET | Freq: Every evening | ORAL | Status: DC | PRN
  Administered 2015-10-06 – 2015-10-15 (×10): 5 mg via ORAL
  Filled 2015-10-06 (×10): qty 1

## 2015-10-06 MED ORDER — TIROFIBAN (AGGRASTAT) BOLUS VIA INFUSION
INTRAVENOUS | Status: DC | PRN
  Administered 2015-10-06: 907.5 ug via INTRAVENOUS

## 2015-10-06 MED ORDER — ATORVASTATIN CALCIUM 80 MG PO TABS: 80.0000 mg | ORAL_TABLET | Freq: Every day | ORAL | Status: DC

## 2015-10-06 MED ORDER — ASPIRIN 81 MG PO CHEW
81.0000 mg | CHEWABLE_TABLET | Freq: Every day | ORAL | Status: DC
  Administered 2015-10-07 – 2015-10-15 (×9): 81 mg via ORAL
  Filled 2015-10-06 (×9): qty 1

## 2015-10-06 MED ORDER — BIVALIRUDIN 250 MG IV SOLR
250.0000 mg | INTRAVENOUS | Status: DC | PRN
  Administered 2015-10-06: 0.25 mg/kg/h via INTRAVENOUS

## 2015-10-06 MED ORDER — SODIUM CHLORIDE 0.9% FLUSH: 3.0000 mL | INTRAVENOUS | Status: DC | PRN

## 2015-10-06 MED ORDER — SODIUM CHLORIDE 0.9% FLUSH
3.0000 mL | Freq: Two times a day (BID) | INTRAVENOUS | Status: DC
  Administered 2015-10-06: 3 mL via INTRAVENOUS

## 2015-10-06 MED ORDER — LABETALOL HCL 5 MG/ML IV SOLN
INTRAVENOUS | Status: DC | PRN
  Administered 2015-10-06: 10 mg via INTRAVENOUS

## 2015-10-06 MED ORDER — TICAGRELOR 90 MG PO TABS
ORAL_TABLET | ORAL | Status: DC | PRN
  Administered 2015-10-06: 180 mg via ORAL

## 2015-10-06 MED ORDER — ONDANSETRON HCL 4 MG/2ML IJ SOLN
4.0000 mg | Freq: Four times a day (QID) | INTRAMUSCULAR | Status: DC | PRN
  Administered 2015-10-07: 4 mg via INTRAVENOUS
  Filled 2015-10-06: qty 2

## 2015-10-06 MED ORDER — TICAGRELOR 90 MG PO TABS
90.0000 mg | ORAL_TABLET | Freq: Two times a day (BID) | ORAL | Status: DC
Start: 2015-10-07 — End: 2015-10-10
  Administered 2015-10-07 – 2015-10-09 (×5): 90 mg via ORAL
  Filled 2015-10-06 (×5): qty 1

## 2015-10-06 MED ORDER — IOPAMIDOL (ISOVUE-370) INJECTION 76%
INTRAVENOUS | Status: DC | PRN
  Administered 2015-10-06: 150 mL

## 2015-10-06 MED ORDER — NITROGLYCERIN 0.4 MG SL SUBL
0.4000 mg | SUBLINGUAL_TABLET | SUBLINGUAL | Status: DC | PRN
  Administered 2015-10-07 (×2): 0.4 mg via SUBLINGUAL
  Filled 2015-10-06: qty 1

## 2015-10-06 MED ORDER — METOPROLOL TARTRATE 12.5 MG HALF TABLET
12.5000 mg | ORAL_TABLET | Freq: Two times a day (BID) | ORAL | Status: DC
  Administered 2015-10-06 – 2015-10-15 (×19): 12.5 mg via ORAL
  Filled 2015-10-06 (×20): qty 1

## 2015-10-06 MED ORDER — TIROFIBAN HCL IN NACL 5-0.9 MG/100ML-% IV SOLN
0.0750 ug/kg/min | INTRAVENOUS | Status: DC
  Administered 2015-10-06 – 2015-10-07 (×2): 0.075 ug/kg/min via INTRAVENOUS
  Filled 2015-10-06: qty 100

## 2015-10-06 MED ORDER — BIVALIRUDIN BOLUS VIA INFUSION - CUPID
INTRAVENOUS | Status: DC | PRN
  Administered 2015-10-06: 27.225 mg via INTRAVENOUS

## 2015-10-06 MED ORDER — ATORVASTATIN CALCIUM 80 MG PO TABS
80.0000 mg | ORAL_TABLET | Freq: Every day | ORAL | Status: DC
  Administered 2015-10-06 – 2015-10-19 (×13): 80 mg via ORAL
  Filled 2015-10-06 (×13): qty 1

## 2015-10-06 MED ORDER — ACETAMINOPHEN 325 MG PO TABS
650.0000 mg | ORAL_TABLET | ORAL | Status: DC | PRN
  Administered 2015-10-06 – 2015-10-16 (×19): 650 mg via ORAL
  Filled 2015-10-06 (×20): qty 2

## 2015-10-06 MED ORDER — NITROGLYCERIN 1 MG/10 ML FOR IR/CATH LAB
INTRA_ARTERIAL | Status: DC | PRN
  Administered 2015-10-06 (×2): 200 ug via INTRACORONARY

## 2015-10-06 MED ORDER — HEPARIN (PORCINE) IN NACL 100-0.45 UNIT/ML-% IJ SOLN
700.0000 [IU]/h | INTRAMUSCULAR | Status: DC
  Administered 2015-10-06: 600 [IU]/h via INTRAVENOUS
  Administered 2015-10-08: 900 [IU]/h via INTRAVENOUS
  Administered 2015-10-09 – 2015-10-14 (×6): 800 [IU]/h via INTRAVENOUS
  Filled 2015-10-06 (×7): qty 250

## 2015-10-06 SURGICAL SUPPLY — 20 items
BALLN ANGIOSCULPT RX 2.5X15 (BALLOONS) ×2
BALLN EUPHORA RX 2.0X12 (BALLOONS) ×2
BALLN TREK RX 2.5X15 (BALLOONS) ×2
BALLOON ANGIOSCULPT RX 2.5X15 (BALLOONS) ×1 IMPLANT
BALLOON EUPHORA RX 2.0X12 (BALLOONS) ×1 IMPLANT
BALLOON TREK RX 2.5X15 (BALLOONS) ×1 IMPLANT
CATH INFINITI 5FR ANG PIGTAIL (CATHETERS) ×2 IMPLANT
CATH OPTITORQUE TIG 4.0 5F (CATHETERS) ×2 IMPLANT
CATH VISTA GUIDE 6FR XBLAD3.5 (CATHETERS) ×2 IMPLANT
DEVICE RAD COMP TR BAND LRG (VASCULAR PRODUCTS) ×2 IMPLANT
GLIDESHEATH SLEND A-KIT 6F 22G (SHEATH) ×2 IMPLANT
KIT ENCORE 26 ADVANTAGE (KITS) ×2 IMPLANT
KIT HEART LEFT (KITS) ×2 IMPLANT
PACK CARDIAC CATHETERIZATION (CUSTOM PROCEDURE TRAY) ×2 IMPLANT
SHEATH PINNACLE 6F 10CM (SHEATH) ×2 IMPLANT
TRANSDUCER W/STOPCOCK (MISCELLANEOUS) ×2 IMPLANT
TUBING CIL FLEX 10 FLL-RA (TUBING) ×2 IMPLANT
WIRE HI TORQ BMW 190CM (WIRE) ×2 IMPLANT
WIRE RUNTHROUGH .014X180CM (WIRE) ×2 IMPLANT
WIRE SAFE-T 1.5MM-J .035X260CM (WIRE) ×2 IMPLANT

## 2015-10-06 NOTE — Consult Note (Signed)
ANTICOAGULATION CONSULT NOTE - Initial Consult  Pharmacy Consult for Heparin and Aggrastat Indication: multivessel CAD  No Known Allergies  Patient Measurements: Height: 4' 11.84" (152 cm) IBW/kg (Calculated) : 45.14 Weight: 49kg  Vital Signs: BP: 129/76 mmHg (06/02 1645) Pulse Rate: 78 (06/02 1645)  Labs:  Recent Labs  10/06/15 1506  HGB 11.9*  HCT 36.3  PLT 330  APTT 32  LABPROT 13.6  INR 1.02  CREATININE 1.60*  TROPONINI 0.14*    CrCl cannot be calculated (Unknown ideal weight.).   Medical History: Past Medical History  Diagnosis Date  . COPD (chronic obstructive pulmonary disease) (HCC)   . Anxiety   . Depression   . TIA (transient ischemic attack)   . IBS (irritable bowel syndrome)   . Oral thrush    Assessment: 81yof presented as a Code STEMI and taken directly to cath lab where she was found to have 95% stenosed mid LAD, 75% stenosed ost-proximal LAD, and 75% stenosed left main. PTCA was unsuccessful due to heavy calcification of the lesions. Pharmacy now asked to begin aggrastat and heparin post-cath with plans for potential rotational atherectomy in a week or two.   Aggrastat to continue x 18 hours - started ~ 1600 in the cath lab.  Radial sheath removed and TR band placed ~ 1600. Heparin to begin 8 hours post sheath removal.  Goal of Therapy:  Heparin level 0.3-0.7 units/ml Monitor platelets by anticoagulation protocol: Yes   Plan:  1) Continue aggrastat 0.03975mcg/kg/min x 18 hours (will end ~1000 on 6/3) 2) At 0000 tomorrow, begin heparin @  600 units/hr with no bolus 3) Check 8 hour heparin level 4) Daily heparin level and CBC  Fredrik RiggerMarkle, Krissy Orebaugh Sue 10/06/2015,4:58 PM

## 2015-10-06 NOTE — Progress Notes (Signed)
CRITICAL VALUE ALERT  Critical value received:  Tropnin 0.65  Date of notification:  10/06/15  Time of notification:  2000  Critical value read back:Yes.    Nurse who received alert:  E.Akemi Overholser RN  MD notified (1st page):  Ranae Palms. Harding  Time of first page:  2001  MD notified (2nd page):  Time of second page:  Responding MD:  Ranae Palms. Harding  Time MD responded:  2001

## 2015-10-06 NOTE — H&P (Signed)
         Tonya Payne is an 81 y.o. female.   Primary Cardiologist: [facility] Dr. Herbie BaltimoreHarding PCP : at [facility] Mackie[facility] Cho, MD  Chief Complaint: chest pain  HPI: 81 year old female with no prior cardiac hx. On no meds except for antibiotic for UTI presents by EMS to the cath lab for STEMI. She felt mild discomfort yesterday that did not last. Today at 1300 she had significant chest pain- 10/10 -"something sitting on her chest" with nausea and SOB called her son and he told her to call EMS. EKG with ST elevation V4-6.   By EMS given total of 3 NTG with improvement after 3rd NTG and 4 mg morphine. Pt rec'd 4 baby ASA. Currently 5/10 pain. Seen by Dr. Herbie BaltimoreHarding.   Past Medical History  Diagnosis Date  . COPD (chronic obstructive pulmonary disease) (HCC)   . Anxiety   . Depression   . TIA (transient ischemic attack)   . IBS (irritable bowel syndrome)   . Oral thrush     Past Surgical History  Procedure Laterality Date  . Back surgery    . Neck surgery    . Colectomy      Family History  Problem Relation Age of Onset  . Stroke Mother     dead  . Clotting disorder Father     dead bone marrow disease   Social History:  reports that she has been smoking Cigarettes. She has been smoking about 0.50 packs per day. She has never used smokeless tobacco. She reports that she drinks about 1.2 oz of alcohol per week. She reports that she does not use illicit drugs.  Allergies: No Known Allergies  OUTPATIENT MEDICATIONS: An antibiotic for UTI    Lab Results Last 48 Hours    No results found for this or any previous visit (from the past 48 hour(s)).    Imaging Results (Last 48 hours)    No results found.    ROS: General:no colds or fevers, no weight changes Skin:no rashes or ulcers HEENT:no blurred vision, no congestion CV:see HPI PUL:see HPI GI:+ diarrhea today, watery no blood, no constipation or melena,  no indigestion GU:no hematuria, + dysuria- on antibiotic MS:no joint pain, no claudication Neuro:no syncope, no lightheadedness Endo:no diabetes, no thyroid disease  There were no vitals taken for this visit. PE: General:Pleasant affect, + chest pain, anxious  Skin:Warm and dry but pale, brisk capillary refill HEENT:normocephalic, sclera clear, mucus membranes moist Neck:supple, no JVD, no bruits  Heart:S1S2 RRR without murmur, gallup, rub or click Lungs:clear without rales, rhonchi, or wheezes WUJ:WJXBAbd:soft, non tender, + BS, do not palpate liver spleen or masses Ext:no lower ext edema, 2+ pedal pulses, 2+ radial pulses Neuro:alert and oriented, MAE, follows commands, + facial symmetry   Assessment/Plan Principal Problem:   ST elevation (STEMI) myocardial infarction involving left anterior descending coronary artery [facility](HCC)  Admitted with acute STEMI with ST elevation of V4-6 and to cath lab. Labs have been sent.  Will check CXR once in cardiac ICU,  Serial troponins.  Check TSH, magnesiuim with next labs.  Other orders per Dr. Herbie BaltimoreHarding.  Will add high dose statin.  And BB.    Nada BoozerLaura Ingold Nurse Practitioner Certified [facility] Pager [phone] or after 5pm or weekends call [phone] 10/06/2015, 2:50 PM

## 2015-10-06 NOTE — Progress Notes (Signed)
10/06/2015 1800 Phlebotomy notified of need to collect labs as ordered, pt. Is not a nurse draw.  Aiva Miskell, Blanchard KelchStephanie Ingold

## 2015-10-06 NOTE — ED Notes (Signed)
Code STEMI activated @ 1430.

## 2015-10-07 LAB — BASIC METABOLIC PANEL
Anion gap: 11 (ref 5–15)
BUN: 21 mg/dL — AB (ref 6–20)
CHLORIDE: 100 mmol/L — AB (ref 101–111)
CO2: 21 mmol/L — AB (ref 22–32)
Calcium: 8.8 mg/dL — ABNORMAL LOW (ref 8.9–10.3)
Creatinine, Ser: 1.84 mg/dL — ABNORMAL HIGH (ref 0.44–1.00)
GFR calc Af Amer: 29 mL/min — ABNORMAL LOW (ref >60)
GFR calc non Af Amer: 25 mL/min — ABNORMAL LOW (ref >60)
Glucose, Bld: 116 mg/dL — ABNORMAL HIGH (ref 65–99)
POTASSIUM: 4 mmol/L (ref 3.5–5.1)
SODIUM: 132 mmol/L — AB (ref 135–145)

## 2015-10-07 LAB — CBC
HEMATOCRIT: 34.6 % — AB (ref 36.0–46.0)
Hemoglobin: 11.1 g/dL — ABNORMAL LOW (ref 12.0–15.0)
MCH: 30.5 pg (ref 26.0–34.0)
MCHC: 32.1 g/dL (ref 30.0–36.0)
MCV: 95.1 fL (ref 78.0–100.0)
Platelets: 303 10*3/uL (ref 150–400)
RBC: 3.64 MIL/uL — AB (ref 3.87–5.11)
RDW: 13.8 % (ref 11.5–15.5)
WBC: 8.4 10*3/uL (ref 4.0–10.5)

## 2015-10-07 LAB — LIPID PANEL
Cholesterol: 200 mg/dL (ref 0–200)
HDL: 85 mg/dL (ref >40)
LDL CALC: 93 mg/dL (ref 0–99)
TRIGLYCERIDES: 112 mg/dL (ref <150)
Total CHOL/HDL Ratio: 2.4 RATIO
VLDL: 22 mg/dL (ref 0–40)

## 2015-10-07 LAB — HEMOGLOBIN A1C
HEMOGLOBIN A1C: 5.5 % (ref 4.8–5.6)
MEAN PLASMA GLUCOSE: 111 mg/dL

## 2015-10-07 LAB — TROPONIN I
TROPONIN I: 2.07 ng/mL — AB (ref <0.031)
Troponin I: 3.22 ng/mL (ref <0.031)
Troponin I: 3.27 ng/mL (ref <0.031)
Troponin I: 2.67 ng/mL (ref <0.031)

## 2015-10-07 LAB — HEPARIN LEVEL (UNFRACTIONATED)
HEPARIN UNFRACTIONATED: 0.22 [IU]/mL — AB (ref 0.30–0.70)
Heparin Unfractionated: 0.18 IU/mL — ABNORMAL LOW (ref 0.30–0.70)

## 2015-10-07 MED ORDER — NITROGLYCERIN IN D5W 200-5 MCG/ML-% IV SOLN
0.0000 ug/min | INTRAVENOUS | Status: DC
  Administered 2015-10-07: 5 ug/min via INTRAVENOUS
  Filled 2015-10-07: qty 250

## 2015-10-07 NOTE — Progress Notes (Signed)
Patient ID: Jolayne Branson, female   DOB: 03-Dec-1934, 81 y.o.   MRN: [id number]    Patient Name: Tonya Payne Date of Encounter: 10/07/2015     Principal Problem:   ST elevation (STEMI) myocardial infarction involving left anterior descending coronary artery [facility])    SUBJECTIVE  Chest pain is resolved. No sob. No palpitations.  CURRENT MEDS . aspirin  81 mg Oral Daily  . atorvastatin  80 mg Oral q1800  . metoprolol tartrate  12.5 mg Oral BID  . sodium chloride flush  3 mL Intravenous Q12H  . ticagrelor  90 mg Oral BID    OBJECTIVE  Filed Vitals:   10/07/15 1000 10/07/15 1100 10/07/15 1122 10/07/15 1200  BP: 116/63 124/67  121/64  Pulse: 72 72  80  Temp:   98.1 F (36.7 C)   TempSrc:   Oral   Resp: Height:      Weight:      SpO2: 98% 98%  97%    Intake/Output Summary (Last 24 hours) at 10/07/15 1230 Last data filed at 10/07/15 1200  Gross per 24 hour  Intake 276.83 ml  Output   1980 ml  Net -1703.17 ml   Filed Weights   10/06/15 1700 10/07/15 0500  Weight: 108 lb 3.9 oz (49.1 kg) 107 lb 2.3 oz (48.6 kg)    PHYSICAL EXAM  General: Pleasant, NAD. Neuro: Alert and oriented X 3. Moves all extremities spontaneously. Psych: Normal affect. HEENT:  Normal  Neck: Supple without bruits or JVD. Lungs:  Resp regular and unlabored, CTA. Heart: RRR no s3, prominent s4 is present, no murmurs. Abdomen: Soft, non-tender, non-distended, BS + x 4.  Extremities: No clubbing, cyanosis or edema. DP/PT/Radials 2+ and equal bilaterally.  Accessory Clinical Findings  CBC  Recent Labs  10/06/15 1506 10/06/15 2303 10/07/15 0326  WBC 10.3  --  8.4  NEUTROABS 7.4  --   --   HGB 11.9*  --  11.1*  HCT 36.3  --  34.6*  MCV 94.0  --  95.1  PLT 330 278 303   Basic Metabolic Panel  Recent Labs  10/06/15 1506 10/07/15 0326  NA 130* 132*  K 4.4 4.0  CL 98* 100*  CO2 21* 21*  GLUCOSE 130* 116*  BUN 20 21*  CREATININE 1.60* 1.84*  CALCIUM 9.2 8.8*   Liver  Function Tests  Recent Labs  10/06/15 1506  AST 30  ALT 14  ALKPHOS 63  BILITOT 0.9  PROT 7.0  ALBUMIN 3.6   No results for input(s): LIPASE, AMYLASE in the last 72 hours. Cardiac Enzymes  Recent Labs  10/06/15 2303 10/07/15 0326 10/07/15 0924  TROPONINI 2.07* 3.22* 3.27*   BNP Invalid input(s): POCBNP D-Dimer No results for input(s): DDIMER in the last 72 hours. Hemoglobin A1C  Recent Labs  10/06/15 1903  HGBA1C 5.5   Fasting Lipid Panel  Recent Labs  10/07/15 0326  CHOL 200  HDL 85  LDLCALC 93  TRIG 112  CHOLHDL 2.4   Thyroid Function Tests  Recent Labs  10/06/15 1903  TSH 4.405    TELE  NSR  ECG  NSR with anterior STT changes, improved from previous ECG  Radiology/Studies  Dg Chest Port 1 View  10/06/2015  CLINICAL DATA:  Post cardiac catheterization EXAM: PORTABLE CHEST 1 VIEW COMPARISON:  08/17/2014 FINDINGS: Mild hyperinflation of the lungs. Heart is borderline enlarged. Biapical pleural thickening. Scarring in the lung bases. No acute airspace opacities or effusions. No  acute bony abnormality. No pneumothorax. IMPRESSION: COPD/chronic changes.  Borderline heart size. Electronically Signed   By: Charlett NoseKevin  Dover M.D.   On: 10/06/2015 19:24    ASSESSMENT AND PLAN  1. Acute STEMI, s/p PTCA - note Dr. Erich MontaneHardings report of heavily Calcified coronary arteries. She is pain free and troponin only minimally elevated. She will continue her current meds. Note plan for review of films from multiple interventional colleagues. Ultimately, surgery might be best approach. 2. Dyslipidemia - she will be treated with high dose statin therapy.  Yuko Coventry,M.D.  10/07/2015 12:30 PM

## 2015-10-07 NOTE — Consult Note (Signed)
ANTICOAGULATION CONSULT NOTE  Pharmacy Consult for Heparin and Aggrastat Indication: multivessel CAD  No Known Allergies  Patient Measurements: Height: 4' 11.84" (152 cm) Weight: 107 lb 2.3 oz (48.6 kg) IBW/kg (Calculated) : 45.14 Weight: 49kg  Vital Signs: Temp: 97.7 F (36.5 C) (06/03 2000) Temp Source: Oral (06/03 2000) BP: 127/70 mmHg (06/03 2000) Pulse Rate: 84 (06/03 2000)  Labs:  Recent Labs  10/06/15 1506  10/06/15 2303 10/07/15 0326 10/07/15 0924 10/07/15 1619 10/07/15 2027  HGB 11.9*  --   --  11.1*  --   --   --   HCT 36.3  --   --  34.6*  --   --   --   PLT 330  --  278 303  --   --   --   APTT 32  --   --   --   --   --   --   LABPROT 13.6  --   --   --   --   --   --   INR 1.02  --   --   --   --   --   --   HEPARINUNFRC  --   --   --   --  0.18*  --  0.22*  CREATININE 1.60*  --   --  1.84*  --   --   --   TROPONINI 0.14*  < > 2.07* 3.22* 3.27* 2.67*  --   < > = values in this interval not displayed.  Estimated Creatinine Clearance: 17.7 mL/min (by C-G formula based on Cr of 1.84).   Medical History: Past Medical History  Diagnosis Date  . COPD (chronic obstructive pulmonary disease) (HCC)   . Anxiety   . Depression   . TIA (transient ischemic attack)   . IBS (irritable bowel syndrome)   . Oral thrush    Assessment: 81yof presented as a Code STEMI and taken directly to cath lab where she was found to have 95% stenosed mid LAD, 75% stenosed ost-proximal LAD, and 75% stenosed left main. PTCA was unsuccessful due to heavy calcification of the lesions. Pharmacy now asked to begin aggrastat and heparin post-cath with plans for potential rotational atherectomy in a week or two.  -heparin level= 0.22 on 750 units/hr  Goal of Therapy:  Heparin level 0.3-0.7 units/ml Monitor platelets by anticoagulation protocol: Yes   Plan:   -Increase heparin to 900 units/hr -Heparin level in 8 hours and daily wth CBC daily  Harland Germanndrew Tyshae Stair, Pharm D 10/07/2015 9:33  PM

## 2015-10-07 NOTE — Progress Notes (Signed)
Dr. Herbie BaltimoreHarding paged regarding pt's episode of 5/10 CP radiating to L arm, relieved by 2 sublingual nitro tablets, and made aware of Troponin rise to 3.22, orders received for Nitro gtt.

## 2015-10-08 ENCOUNTER — Other Ambulatory Visit (HOSPITAL_COMMUNITY): Payer: PRIVATE HEALTH INSURANCE

## 2015-10-08 ENCOUNTER — Encounter (HOSPITAL_COMMUNITY): Payer: Self-pay | Admitting: Cardiology

## 2015-10-08 DIAGNOSIS — I25119 Atherosclerotic heart disease of native coronary artery with unspecified angina pectoris: Secondary | ICD-10-CM | POA: Diagnosis present

## 2015-10-08 DIAGNOSIS — I420 Dilated cardiomyopathy: Secondary | ICD-10-CM

## 2015-10-08 DIAGNOSIS — Z9861 Coronary angioplasty status: Secondary | ICD-10-CM

## 2015-10-08 DIAGNOSIS — I25118 Atherosclerotic heart disease of native coronary artery with other forms of angina pectoris: Secondary | ICD-10-CM

## 2015-10-08 DIAGNOSIS — E785 Hyperlipidemia, unspecified: Secondary | ICD-10-CM

## 2015-10-08 DIAGNOSIS — I2510 Atherosclerotic heart disease of native coronary artery without angina pectoris: Secondary | ICD-10-CM

## 2015-10-08 DIAGNOSIS — I2109 ST elevation (STEMI) myocardial infarction involving other coronary artery of anterior wall: Secondary | ICD-10-CM

## 2015-10-08 HISTORY — DX: ST elevation (STEMI) myocardial infarction involving other coronary artery of anterior wall: I21.09

## 2015-10-08 HISTORY — DX: Dilated cardiomyopathy: I42.0

## 2015-10-08 HISTORY — DX: Atherosclerotic heart disease of native coronary artery without angina pectoris: I25.10

## 2015-10-08 HISTORY — DX: Coronary angioplasty status: Z98.61

## 2015-10-08 HISTORY — DX: Hyperlipidemia, unspecified: E78.5

## 2015-10-08 LAB — BASIC METABOLIC PANEL
ANION GAP: 7 (ref 5–15)
BUN: 15 mg/dL (ref 6–20)
CHLORIDE: 108 mmol/L (ref 101–111)
CO2: 21 mmol/L — ABNORMAL LOW (ref 22–32)
Calcium: 8.8 mg/dL — ABNORMAL LOW (ref 8.9–10.3)
Creatinine, Ser: 1.55 mg/dL — ABNORMAL HIGH (ref 0.44–1.00)
GFR calc non Af Amer: 31 mL/min — ABNORMAL LOW (ref >60)
GFR, EST AFRICAN AMERICAN: 36 mL/min — AB (ref >60)
Glucose, Bld: 104 mg/dL — ABNORMAL HIGH (ref 65–99)
POTASSIUM: 4.1 mmol/L (ref 3.5–5.1)
SODIUM: 136 mmol/L (ref 135–145)

## 2015-10-08 LAB — CBC
HEMATOCRIT: 33.1 % — AB (ref 36.0–46.0)
HEMOGLOBIN: 10.7 g/dL — AB (ref 12.0–15.0)
MCH: 31 pg (ref 26.0–34.0)
MCHC: 32.3 g/dL (ref 30.0–36.0)
MCV: 95.9 fL (ref 78.0–100.0)
Platelets: 247 10*3/uL (ref 150–400)
RBC: 3.45 MIL/uL — AB (ref 3.87–5.11)
RDW: 14 % (ref 11.5–15.5)
WBC: 7.5 10*3/uL (ref 4.0–10.5)

## 2015-10-08 LAB — HEPARIN LEVEL (UNFRACTIONATED)
HEPARIN UNFRACTIONATED: 0.59 [IU]/mL (ref 0.30–0.70)
Heparin Unfractionated: 0.6 IU/mL (ref 0.30–0.70)

## 2015-10-08 NOTE — Progress Notes (Addendum)
SUBJECTIVE:  No further anginal CP but has had some vague CP in her axilla  OBJECTIVE:   Vitals:   Filed Vitals:   10/08/15 0800 10/08/15 0900 10/08/15 1000 10/08/15 1100  BP: 113/81 103/74 112/71 96/54  Pulse: 80 70 80 75  Temp:      TempSrc:      Resp: Height:      Weight:      SpO2: 98% 100% 98% 99%   I&O's:   Intake/Output Summary (Last 24 hours) at 10/08/15 1123 Last data filed at 10/08/15 1100  Gross per 24 hour  Intake 678.98 ml  Output   1875 ml  Net -1196.02 ml   TELEMETRY: Reviewed telemetry pt in NSR:     PHYSICAL EXAM General: Well developed, well nourished, in no acute distress Head: Eyes PERRLA, No xanthomas.   Normal cephalic and atramatic  Lungs:   Clear bilaterally to auscultation and percussion. Heart:   HRRR S1 S2 Pulses are 2+ & equal. Abdomen: Bowel sounds are positive, abdomen soft and non-tender without masses Extremities:   No clubbing, cyanosis or edema.  DP +1 Neuro: Alert and oriented X 3. Psych:  Good affect, responds appropriately   LABS: Basic Metabolic Panel:  Recent Labs  16/10/96 1506 10/07/15 0326  NA 130* 132*  K 4.4 4.0  CL 98* 100*  CO2 21* 21*  GLUCOSE 130* 116*  BUN 20 21*  CREATININE 1.60* 1.84*  CALCIUM 9.2 8.8*   Liver Function Tests:  Recent Labs  10/06/15 1506  AST 30  ALT 14  ALKPHOS 63  BILITOT 0.9  PROT 7.0  ALBUMIN 3.6   No results for input(s): LIPASE, AMYLASE in the last 72 hours. CBC:  Recent Labs  10/06/15 1506  10/07/15 0326 10/08/15 0640  WBC 10.3  --  8.4 7.5  NEUTROABS 7.4  --   --   --   HGB 11.9*  --  11.1* 10.7*  HCT 36.3  --  34.6* 33.1*  MCV 94.0  --  95.1 95.9  PLT 330  < > 303 247  < > = values in this interval not displayed. Cardiac Enzymes:  Recent Labs  10/07/15 0326 10/07/15 0924 10/07/15 1619  TROPONINI 3.22* 3.27* 2.67*   BNP: Invalid input(s): POCBNP D-Dimer: No results for input(s): DDIMER in the last 72 hours. Hemoglobin A1C:  Recent  Labs  10/06/15 1903  HGBA1C 5.5   Fasting Lipid Panel:  Recent Labs  10/07/15 0326  CHOL 200  HDL 85  LDLCALC 93  TRIG 112  CHOLHDL 2.4   Thyroid Function Tests:  Recent Labs  10/06/15 1903  TSH 4.405   Anemia Panel: No results for input(s): VITAMINB12, FOLATE, FERRITIN, TIBC, IRON, RETICCTPCT in the last 72 hours. Coag Panel:   Lab Results  Component Value Date   INR 1.02 10/06/2015   INR 0.97 02/25/2015    RADIOLOGY: Dg Chest Port 1 View  10/06/2015  CLINICAL DATA:  Post cardiac catheterization EXAM: PORTABLE CHEST 1 VIEW COMPARISON:  08/17/2014 FINDINGS: Mild hyperinflation of the lungs. Heart is borderline enlarged. Biapical pleural thickening. Scarring in the lung bases. No acute airspace opacities or effusions. No acute bony abnormality. No pneumothorax. IMPRESSION: COPD/chronic changes.  Borderline heart size. Electronically Signed   By: Charlett Nose M.D.   On: 10/06/2015 19:24    ASSESSMENT AND PLAN  1. Acute STEMI, s/p PTCA  With DES to 95% mid LAD with 70% residual stenosis- note Dr. Erich Montane  report of heavily Calcified coronary arteries with 75% LM lesion and 75% ost-prox LAD. She is pain free and troponin only mildly elevated (peak 3.27). She will continue her current meds. Note plan for review of films from multiple interventional colleagues. Ultimately, surgery might be best approach.  Await for further recs from interventionalist.  Continue ASA/statin/BB/IV NTG and Heparin gtts and Brilinta.  2.  Severe ischemic DCM with EF 20-25%.  Echo pending.  Continue low dose metoprolol and consider changing to Coreg once BP stable.  No ACE I due to elevated creatinine and soft BP. 3. Dyslipidemia - she will be treated with high dose statin therapy. 4.  Acute kidney injury secondary to acute MI and contrast dye exposure.  Continue to follow creatinine.  LVEDP 17 at cath.  No evidence of CHF on exam. Repeat BMET this am.   Armanda Magicraci Turner, MD  10/08/2015  11:23 AM

## 2015-10-08 NOTE — Progress Notes (Addendum)
ANTICOAGULATION CONSULT NOTE  Pharmacy Consult for Heparin  Indication: multivessel CAD  No Known Allergies  Patient Measurements: Height: 4' 11.84" (152 cm) Weight: 108 lb 14.5 oz (49.4 kg) IBW/kg (Calculated) : 45.14 Weight: 49kg  Vital Signs: Temp: 97.6 F (36.4 C) (06/04 0734) Temp Source: Oral (06/04 0734) BP: 113/58 mmHg (06/04 1200) Pulse Rate: 76 (06/04 1200)  Labs:  Recent Labs  10/06/15 1506  10/06/15 2303 10/07/15 0326 10/07/15 0924 10/07/15 1619 10/07/15 2027 10/08/15 0640 10/08/15 0649 10/08/15 0717  HGB 11.9*  --   --  11.1*  --   --   --  10.7*  --   --   HCT 36.3  --   --  34.6*  --   --   --  33.1*  --   --   PLT 330  --  278 303  --   --   --  247  --   --   APTT 32  --   --   --   --   --   --   --   --   --   LABPROT 13.6  --   --   --   --   --   --   --   --   --   INR 1.02  --   --   --   --   --   --   --   --   --   HEPARINUNFRC  --   --   --   --  0.18*  --  0.22*  --  0.60  --   CREATININE 1.60*  --   --  1.84*  --   --   --   --   --  1.55*  TROPONINI 0.14*  < > 2.07* 3.22* 3.27* 2.67*  --   --   --   --   < > = values in this interval not displayed.  Estimated Creatinine Clearance: 21 mL/min (by C-G formula based on Cr of 1.55).   Medical History: Past Medical History  Diagnosis Date  . COPD (chronic obstructive pulmonary disease) (HCC)   . Anxiety   . Depression   . TIA (transient ischemic attack)   . IBS (irritable bowel syndrome)   . Oral thrush   . CAD (coronary artery disease), native coronary artery 10/08/2015  . Congestive dilated cardiomyopathy (HCC) 10/08/2015  . Hyperlipidemia LDL goal <70 10/08/2015   Assessment: 81yof presented as a Code STEMI and taken directly to cath lab where she was found to have 95% stenosed mid LAD, 75% stenosed ost-proximal LAD, and 75% stenosed left main. PTCA was unsuccessful due to heavy calcification of the lesions. Pharmacy dosing heparin post-cath with plans for possible rotational  atherectomy vs surgery.  -heparin level= 0.6 on 900 units/hr  Goal of Therapy:  Heparin level 0.3-0.7 units/ml Monitor platelets by anticoagulation protocol: Yes   Plan:   -Increase heparin to 900 units/hr -Heparin level in 8 hours and daily wth CBC daily  [name], Pharm D 10/08/2015 12:44 PM  addendum -heparin level confirmed at 0.59  Plan -no heparin changes needed  [name], Pharm D 10/08/2015 3:51 PM   e

## 2015-10-09 ENCOUNTER — Other Ambulatory Visit (HOSPITAL_COMMUNITY): Payer: PRIVATE HEALTH INSURANCE

## 2015-10-09 ENCOUNTER — Inpatient Hospital Stay (HOSPITAL_COMMUNITY): Payer: Medicare Other

## 2015-10-09 ENCOUNTER — Encounter (HOSPITAL_COMMUNITY): Payer: Self-pay | Admitting: Cardiology

## 2015-10-09 DIAGNOSIS — I255 Ischemic cardiomyopathy: Secondary | ICD-10-CM

## 2015-10-09 LAB — CBC
HEMATOCRIT: 32.9 % — AB (ref 36.0–46.0)
Hemoglobin: 10.5 g/dL — ABNORMAL LOW (ref 12.0–15.0)
MCH: 30.5 pg (ref 26.0–34.0)
MCHC: 31.9 g/dL (ref 30.0–36.0)
MCV: 95.6 fL (ref 78.0–100.0)
Platelets: 272 10*3/uL (ref 150–400)
RBC: 3.44 MIL/uL — ABNORMAL LOW (ref 3.87–5.11)
RDW: 14 % (ref 11.5–15.5)
WBC: 6.5 10*3/uL (ref 4.0–10.5)

## 2015-10-09 LAB — ECHOCARDIOGRAM COMPLETE
CHL CUP DOP CALC LVOT VTI: 18.8 cm
CHL CUP TV REG PEAK VELOCITY: 254 cm/s
E/e' ratio: 15.2
EWDT: 197 ms
FS: 36 % (ref 28–44)
Height: 59.843 in
IV/PV OW: 1.08
LA vol index: 31.8 mL/m2
LADIAMINDEX: 1.61 cm/m2
LASIZE: 23 cm
LAVOL: 45.4 cm3
LAVOLA4C: 55 mL
LEFT ATRIUM END SYS DIAM: 23 cm
LV E/e' medial: 15.2
LV E/e'average: 15.2
LV TDI E'MEDIAL: 5.11
LV dias vol index: 34 mL/m2
LV e' LATERAL: 5 cm/s
LVDIAVOL: 49 mL (ref 46–106)
LVOT SV: 65 cm3
LVOT area: 3.46 cm2
LVOTD: 21 cm
LVOTPV: 94.7 m/s
Lateral S' vel: 12.7 cm/s
MV Dec: 197
MV Peak grad: 2 mmHg
MV pk E vel: 76 m/s
MVPKAVEL: 106 m/s
PA Sys Pressure: 29 mmHg
PW: 11.9 mm — AB (ref 0.6–1.1)
RV TAPSE: 24.8 cm
RV sys press: 34 mmHg
TDI e' lateral: 5
TR max vel: 254 m/s
WEIGHTICAEL: 1700.19 [oz_av]

## 2015-10-09 LAB — POCT I-STAT, CHEM 8
BUN: 21 mg/dL — AB (ref 6–20)
CALCIUM ION: 1.19 mmol/L (ref 1.13–1.30)
CHLORIDE: 98 mmol/L — AB (ref 101–111)
Creatinine, Ser: 1.4 mg/dL — ABNORMAL HIGH (ref 0.44–1.00)
GLUCOSE: 134 mg/dL — AB (ref 65–99)
HCT: 40 % (ref 36.0–46.0)
Hemoglobin: 13.6 g/dL (ref 12.0–15.0)
Potassium: 4.4 mmol/L (ref 3.5–5.1)
Sodium: 130 mmol/L — ABNORMAL LOW (ref 135–145)
TCO2: 22 mmol/L (ref 0–100)

## 2015-10-09 LAB — HEPARIN LEVEL (UNFRACTIONATED)
HEPARIN UNFRACTIONATED: 0.51 [IU]/mL (ref 0.30–0.70)
HEPARIN UNFRACTIONATED: 0.6 [IU]/mL (ref 0.30–0.70)
Heparin Unfractionated: 0.81 IU/mL — ABNORMAL HIGH (ref 0.30–0.70)

## 2015-10-09 MED ORDER — SODIUM CHLORIDE 0.9 % IV SOLN
INTRAVENOUS | Status: DC
  Administered 2015-10-09: 20:00:00 via INTRAVENOUS

## 2015-10-09 NOTE — Progress Notes (Signed)
  Echocardiogram 2D Echocardiogram has been performed.  Tonya Payne, Tonya Payne 10/09/2015, 8:39 AM

## 2015-10-09 NOTE — Progress Notes (Signed)
Ambien given for sleep last night and the patient slept well throughout the night. Patient reported that she had the best sleep last night. Patient appears very stressed and worried, thus emotional support provided with active listening and non-judgemental therapeutic communication techniques. Patient didn't need xanax after giving the Ambien and emotional support.  Patient reported that she forgot to mention to the doctor for having history of AAA about 7 years ago. Tylenol given once for lower abdominal pain. Urine is yellow colored, not orange like yesterday.

## 2015-10-09 NOTE — Progress Notes (Signed)
ANTICOAGULATION CONSULT NOTE - Follow Up Consult  Pharmacy Consult for heparin Indication: CAD  Labs:  Recent Labs  10/06/15 1506  10/06/15 2303 10/07/15 0326 10/07/15 0924 10/07/15 1619  10/08/15 0640 10/08/15 0649 10/08/15 0717 10/08/15 1324 10/09/15 0310  HGB 11.9*  --   --  11.1*  --   --   --  10.7*  --   --   --   --   HCT 36.3  --   --  34.6*  --   --   --  33.1*  --   --   --   --   PLT 330  --  278 303  --   --   --  247  --   --   --   --   APTT 32  --   --   --   --   --   --   --   --   --   --   --   LABPROT 13.6  --   --   --   --   --   --   --   --   --   --   --   INR 1.02  --   --   --   --   --   --   --   --   --   --   --   HEPARINUNFRC  --   --   --   --  0.18*  --   < >  --  0.60  --  0.59 0.81*  CREATININE 1.60*  --   --  1.84*  --   --   --   --   --  1.55*  --   --   TROPONINI 0.14*  < > 2.07* 3.22* 3.27* 2.67*  --   --   --   --   --   --   < > = values in this interval not displayed.   Assessment: 81yo female now above goal on heparin after two levels at goal, no issues overnight per RN.  Goal of Therapy:  Heparin level 0.3-0.7 units/ml   Plan:  Will decrease heparin gtt by 2 units/kg/hr to 800 units/hr and check level in 8hr.  Vernard GamblesVeronda Samarth Ogle, PharmD, BCPS  10/09/2015,4:05 AM

## 2015-10-09 NOTE — Progress Notes (Addendum)
SUBJECTIVE:  No further anginal CP but has had some vague CP in her axilla over the weekend. Seems quite comfortable.   OBJECTIVE:   Vitals:   Filed Vitals:   10/09/15 0617 10/09/15 0700 10/09/15 0800 10/09/15 0900  BP:  148/93 157/72 145/75  Pulse:  77 78 86  Temp:  97.6 F (36.4 C)    TempSrc:  Oral    Resp:  Height:      Weight: 106 lb 4.2 oz (48.2 kg)     SpO2:  97% 98% 97%   I&O's:    Intake/Output Summary (Last 24 hours) at 10/09/15 0930 Last data filed at 10/09/15 0900  Gross per 24 hour  Intake    681 ml  Output   2150 ml  Net  -1469 ml   TELEMETRY: Personally Reviewed telemetry pt in NSR     PHYSICAL EXAM General: Well developed, well nourished, in no acute distress Head: Eyes PERRLA, No xanthomas.   Normal cephalic and atramatic  Lungs:   Clear bilaterally to auscultation and percussion. Heart:   HRRR S1 S2, +S4 Pulses are 2+ & equal. Abdomen: Bowel sounds are positive, abdomen soft and non-tender without masses Extremities:   No clubbing, cyanosis or edema.  DP +1 Neuro: Alert and oriented X 3. Psych:  Good affect, responds appropriately   LABS: Basic Metabolic Panel:  Recent Labs  16/10/96 0326 10/08/15 0717  NA 132* 136  K 4.0 4.1  CL 100* 108  CO2 21* 21*  GLUCOSE 116* 104*  BUN 21* 15  CREATININE 1.84* 1.55*  CALCIUM 8.8* 8.8*   Liver Function Tests:  Recent Labs  10/06/15 1506  AST 30  ALT 14  ALKPHOS 63  BILITOT 0.9  PROT 7.0  ALBUMIN 3.6   No results for input(s): LIPASE, AMYLASE in the last 72 hours. CBC:  Recent Labs  10/06/15 1506  10/08/15 0640 10/09/15 0310  WBC 10.3  < > 7.5 6.5  NEUTROABS 7.4  --   --   --   HGB 11.9*  < > 10.7* 10.5*  HCT 36.3  < > 33.1* 32.9*  MCV 94.0  < > 95.9 95.6  PLT 330  < > 247 272  < > = values in this interval not displayed. Cardiac Enzymes:  Recent Labs  10/07/15 0326 10/07/15 0924 10/07/15 1619  TROPONINI 3.22* 3.27* 2.67*   BNP: Invalid input(s):  POCBNP D-Dimer: No results for input(s): DDIMER in the last 72 hours. Hemoglobin A1C:  Recent Labs  10/06/15 1903  HGBA1C 5.5   Fasting Lipid Panel:  Recent Labs  10/07/15 0326  CHOL 200  HDL 85  LDLCALC 93  TRIG 112  CHOLHDL 2.4   Thyroid Function Tests:  Recent Labs  10/06/15 1903  TSH 4.405   Anemia Panel: No results for input(s): VITAMINB12, FOLATE, FERRITIN, TIBC, IRON, RETICCTPCT in the last 72 hours. Coag Panel:   Lab Results  Component Value Date   INR 1.02 10/06/2015   INR 0.97 02/25/2015    RADIOLOGY: Dg Chest Port 1 View  10/06/2015  CLINICAL DATA:  Post cardiac catheterization EXAM: PORTABLE CHEST 1 VIEW COMPARISON:  08/17/2014 FINDINGS: Mild hyperinflation of the lungs. Heart is borderline enlarged. Biapical pleural thickening. Scarring in the lung bases. No acute airspace opacities or effusions. No acute bony abnormality. No pneumothorax. IMPRESSION: COPD/chronic changes.  Borderline heart size. Electronically Signed   By: Charlett Nose M.D.   On: 10/06/2015 19:24    ASSESSMENT  AND PLAN  1. Acute STEMI, s/pPTCA only to 95% mid LAD with 70% residual stenosis- note Dr. Erich MontaneHardings report of heavily Calcified coronary arteries with 75% LM lesion and 75% ost-prox LAD. She is pain free and troponin only mildly elevated (peak 3.27). She will continue her current meds. Note plan for review of films from multiple interventional colleagues. Ultimately, surgery might be best approach.  Await for further recs from interventionalist.  Continue ASA/statin/BB/IV NTG and Heparin gtts and Brilinta. Would need to hold Brilinta prior to CABG. 2.  Severe ischemic DCM with EF 20-25%.  Echo pending read, done 6/5.  Continue low dose metoprolol and consider changing to Coreg once BP stable.  No ACE I due to elevated creatinine and soft BP. 3. Dyslipidemia - she will be treated with high dose statin therapy. 4.  Acute kidney injury secondary to acute MI and contrast dye exposure.   Continue to follow creatinine. Improved 1.5 from 1.9  LVEDP 17 at cath.  No evidence of CHF on exam. Watch BMET. 5. AAA- she remembers it being 5cm. Will check abd aortic ultrasound. She says been 7 years since evaluation.    Donato SchultzMark Skains, MD  10/09/2015  9:30 AM

## 2015-10-09 NOTE — Care Management Important Message (Signed)
Important Message  Patient Details  Name: Tonya Payne MRN: [id number] Date of Birth: February 20, 1935   Medicare Important Message Given:  Yes    Raynor Calcaterra Abena 10/09/2015, 11:51 AM

## 2015-10-09 NOTE — Progress Notes (Signed)
CARDIAC REHAB PHASE I   PRE:  Rate/Rhythm: 86 SR (122 ST with coughing spell)    BP: sitting 145/75    SaO2: 99 RA  MODE:  Ambulation: 300 ft   POST:  Rate/Rhythm: 114 ST     BP: sitting 138/81     SaO2: 98 RA  Pt initially stated she doesn't walk but with further investigating I learned that she does walk down hallways and in her apartment with a rollator. Pt used rollator and gait belt support for 300 ft, assist x1. Reminders x2 to stay close to RW but otherwise tolerated well. Denied CP or SOB. To recliner and encouraged more mobility today with staff. Gave pt MI booklet and explained (she was surprised that she had a ballooned artery). Will continue to follow. [id number]   Harriet MassonRandi Kristan Jolinda Pinkstaff CES, ACSM 10/09/2015 10:44 AM

## 2015-10-09 NOTE — Progress Notes (Addendum)
ANTICOAGULATION CONSULT NOTE  Pharmacy Consult for Heparin  Indication: multivessel CAD  No Known Allergies  Patient Measurements: Height: 4' 11.84" (152 cm) Weight: 106 lb 4.2 oz (48.2 kg) IBW/kg (Calculated) : 45.14 Weight: 49kg  Vital Signs: Temp: 97.6 F (36.4 C) (06/05 1000) Temp Source: Oral (06/05 1000) BP: 108/62 mmHg (06/05 1102) Pulse Rate: 78 (06/05 1102)  Labs:  Recent Labs  10/06/15 1506  10/07/15 0326 10/07/15 0924 10/07/15 1619  10/08/15 0640 10/08/15 0649 10/08/15 0717 10/08/15 1324 10/09/15 0310  HGB 11.9*  --  11.1*  --   --   --  10.7*  --   --   --  10.5*  HCT 36.3  --  34.6*  --   --   --  33.1*  --   --   --  32.9*  PLT 330  < > 303  --   --   --  247  --   --   --  272  APTT 32  --   --   --   --   --   --   --   --   --   --   LABPROT 13.6  --   --   --   --   --   --   --   --   --   --   INR 1.02  --   --   --   --   --   --   --   --   --   --   HEPARINUNFRC  --   --   --  0.18*  --   < >  --  0.60  --  0.59 0.81*  CREATININE 1.60*  --  1.84*  --   --   --   --   --  1.55*  --   --   TROPONINI 0.14*  < > 3.22* 3.27* 2.67*  --   --   --   --   --   --   < > = values in this interval not displayed.  Estimated Creatinine Clearance: 21 mL/min (by C-G formula based on Cr of 1.55).   Medical History: Past Medical History  Diagnosis Date  . COPD (chronic obstructive pulmonary disease) (HCC)   . Anxiety   . Depression   . TIA (transient ischemic attack)   . IBS (irritable bowel syndrome)   . Oral thrush   . CAD (coronary artery disease), native coronary artery 10/08/2015  . Congestive dilated cardiomyopathy (HCC) 10/08/2015  . Hyperlipidemia LDL goal <70 10/08/2015   Assessment: 81yof presented as a Code STEMI and taken directly to cath lab where she was found to have 95% stenosed mid LAD, 75% stenosed post-proximal LAD, and 75% stenosed left main. PTCA was unsuccessful due to heavy calcification of the lesions. Pharmacy dosing heparin  post-cath with plans for possible rotational atherectomy vs surgery. No AC pta.  Heparin level now therapeutic 0.51 on 800 units/h. Hg low stable, plt wnl. No bleed documented.  Goal of Therapy:  Heparin level 0.3-0.7 units/ml Monitor platelets by anticoagulation protocol: Yes   Plan:   -Heparin at 800 units/h -8h HL to confirm, daily HL/CBC -Monitor s/sx bleeding -f/u heparin plans. Consider d/c if no surgery plans  Babs Bertin, PharmD, [facility] Clinical Pharmacist Pager [phone] 10/09/2015 12:02 PM   Addendum: Confirmatory heparin level is therapeutic at 0.60. Continue heparin at 800 units/hr and follow up AM labs.  Louie Casa, PharmD,  BCPS 10/09/2015, 8:03 PM

## 2015-10-10 ENCOUNTER — Other Ambulatory Visit: Payer: Self-pay | Admitting: *Deleted

## 2015-10-10 DIAGNOSIS — I25110 Atherosclerotic heart disease of native coronary artery with unstable angina pectoris: Secondary | ICD-10-CM

## 2015-10-10 DIAGNOSIS — I2510 Atherosclerotic heart disease of native coronary artery without angina pectoris: Secondary | ICD-10-CM

## 2015-10-10 LAB — CBC
HEMATOCRIT: 33.5 % — AB (ref 36.0–46.0)
HEMOGLOBIN: 10.8 g/dL — AB (ref 12.0–15.0)
MCH: 30.6 pg (ref 26.0–34.0)
MCHC: 32.2 g/dL (ref 30.0–36.0)
MCV: 94.9 fL (ref 78.0–100.0)
Platelets: 266 10*3/uL (ref 150–400)
RBC: 3.53 MIL/uL — AB (ref 3.87–5.11)
RDW: 13.8 % (ref 11.5–15.5)
WBC: 5.7 10*3/uL (ref 4.0–10.5)

## 2015-10-10 LAB — HEPARIN LEVEL (UNFRACTIONATED): Heparin Unfractionated: 0.47 IU/mL (ref 0.30–0.70)

## 2015-10-10 NOTE — Care Management Note (Signed)
Case Management Note  Patient Details  Name: Julio AlmSuann Grismore MRN: [id number] Date of Birth: 1935/02/17  Subjective/Objective:      Admitted with Code STEMI              Action/Plan:  PTA - [place] [facility] [place].  Pending CABG 10/13/15 .  Daughter in law at bedside; pt doesn't have recommended supervision post  tentative CABG discharge - per daughter in law;  Pt has small room and no one else can fit in for overnights and her home has steps that pt can not travel to get to bedrooms.  CM will continue to follow for discharge needs.      Expected Discharge Date:                  Expected Discharge Plan:  Skilled Nursing Facility  In-House Referral:  Clinical Social Work  Discharge planning Services  CM Consult  Post Acute Care Choice:    Choice offered to:     DME Arranged:    DME Agency:     HH Arranged:    HH Agency:     Status of Service:  In process, will continue to follow  Medicare Important Message Given:  Yes Date Medicare IM Given:    Medicare IM give by:    Date Additional Medicare IM Given:    Additional Medicare Important Message give by:     If discussed at Long Length of Stay Meetings, dates discussed:    Additional Comments:  Cherylann ParrClaxton,  Ducre S, RN 10/10/2015, 4:07 PM

## 2015-10-10 NOTE — Progress Notes (Signed)
SUBJECTIVE:  No CP, no SOB. Daughter in room,  Seems quite comfortable.   OBJECTIVE:   Vitals:   Filed Vitals:   10/10/15 0700 10/10/15 0800 10/10/15 0900 10/10/15 1000  BP: 140/78 145/75 150/79 122/72  Pulse: 84 81 89 88  Temp: 97.9 F (36.6 C)     TempSrc: Oral     Resp: 23 24 22 27   Height:      Weight:      SpO2: 99% 98% 96% 99%   I&O's:    Intake/Output Summary (Last 24 hours) at 10/10/15 1052 Last data filed at 10/10/15 1000  Gross per 24 hour  Intake    872 ml  Output   1525 ml  Net   -653 ml   TELEMETRY: Personally Reviewed telemetry pt in NSR     PHYSICAL EXAM General: Well developed, well nourished, in no acute distress Head: Eyes PERRLA, No xanthomas.   Normal cephalic and atramatic  Lungs:   Clear bilaterally to auscultation and percussion. Heart:   HRRR S1 S2, +S4 Pulses are 2+ & equal. Abdomen: Bowel sounds are positive, abdomen soft and non-tender without masses Extremities:   No clubbing, cyanosis or edema.  DP +1 Neuro: Alert and oriented X 3. Psych:  Good affect, responds appropriately   LABS: Basic Metabolic Panel:  Recent Labs  91/47/8204/08/20 0717  NA 136  K 4.1  CL 108  CO2 21*  GLUCOSE 104*  BUN 15  CREATININE 1.55*  CALCIUM 8.8*   Liver Function Tests: No results for input(s): AST, ALT, ALKPHOS, BILITOT, PROT, ALBUMIN in the last 72 hours. No results for input(s): LIPASE, AMYLASE in the last 72 hours. CBC:  Recent Labs  10/09/15 0310 10/10/15 0241  WBC 6.5 5.7  HGB 10.5* 10.8*  HCT 32.9* 33.5*  MCV 95.6 94.9  PLT 272 266   Cardiac Enzymes:  Recent Labs  10/07/15 1619  TROPONINI 2.67*   BNP: Invalid input(s): POCBNP D-Dimer: No results for input(s): DDIMER in the last 72 hours. Hemoglobin A1C: No results for input(s): HGBA1C in the last 72 hours. Fasting Lipid Panel: No results for input(s): CHOL, HDL, LDLCALC, TRIG, CHOLHDL, LDLDIRECT in the last 72 hours. Thyroid Function Tests: No results for input(s): TSH,  T4TOTAL, T3FREE, THYROIDAB in the last 72 hours.  Invalid input(s): FREET3 Anemia Panel: No results for input(s): VITAMINB12, FOLATE, FERRITIN, TIBC, IRON, RETICCTPCT in the last 72 hours. Coag Panel:   Lab Results  Component Value Date   INR 1.02 10/06/2015   INR 0.97 02/25/2015    RADIOLOGY: [place] Aorta  10/09/2015  CLINICAL DATA:  Aortic aneurysm, follow-up. Recently scratch the previous tobacco abuse. EXAM: ULTRASOUND OF ABDOMINAL AORTA TECHNIQUE: Ultrasound examination of the abdominal aorta was performed to evaluate for abdominal aortic aneurysm. COMPARISON:  CT 08/17/2014 and previous FINDINGS: Abdominal Aorta Distal fusiform dilatation to maximum AP diameter of 5 cm (stable since prior CT) extending over length of at least 8 cm. Right common iliac artery measures 11 mm diameter, left 9 mm. IMPRESSION: 1. Stable 5 cm distal abdominal aortic fusiform aneurysm. Recommend followup by abdomen and pelvis CTA in 3-6 months, and vascular surgery referral/consultation if not already obtained. This recommendation follows ACR consensus guidelines: White Paper of the ACR Incidental Findings Committee II on Vascular Findings. J Am Coll Radiol 2013; 10:789-794. Electronically Signed   By: [name]  Hassell M.D.   On: 10/09/2015 16:18   Dg Chest Port 1 View  10/06/2015  CLINICAL DATA:  Post cardiac catheterization EXAM:  PORTABLE CHEST 1 VIEW COMPARISON:  08/17/2014 FINDINGS: Mild hyperinflation of the lungs. Heart is borderline enlarged. Biapical pleural thickening. Scarring in the lung bases. No acute airspace opacities or effusions. No acute bony abnormality. No pneumothorax. IMPRESSION: COPD/chronic changes.  Borderline heart size. Electronically Signed   By: Charlett Nose M.D.   On: 10/06/2015 19:24   Scheduled Meds: . aspirin  81 mg Oral Daily  . atorvastatin  80 mg Oral q1800  . metoprolol tartrate  12.5 mg Oral BID   Continuous Infusions: . sodium chloride    . sodium chloride 10 mL/hr at 10/10/15 1000    . heparin 800 Units/hr (10/10/15 1000)  . nitroGLYCERIN Stopped (10/07/15 0930)   PRN Meds:.acetaminophen, ALPRAZolam, nitroGLYCERIN, ondansetron (ZOFRAN) IV, zolpidem  ASSESSMENT AND PLAN  1. Acute STEMI, s/pPTCA only to 95% mid LAD with 70% residual stenosis- note Dr. Erich Montane report of heavily Calcified coronary arteries with 75% LM lesion and 75% ost-prox LAD. She is pain free and troponin only mildly elevated (peak 3.27).  Continue ASA/statin/BB/IV NTG and Heparin gtts. Holding Brilinta prior to CABG (last dose 10/09/15). Spoke to Dr. [name]. Potential CABG Friday.   2.  Severe ischemic DCM with EF 20-25% on cath improved to 40% on Echo (likely stunning as well).  Continue low dose metoprolol and consider changing to Coreg once BP stable.  No ACE I due to elevated creatinine and soft BP.  3. Dyslipidemia -  high dose statin therapy.  4.  Acute kidney injury secondary to acute MI and contrast dye exposure.  Continue to follow creatinine. Improved 1.5 from 1.9  LVEDP 17 at cath.  No evidence of CHF on exam. Watch BMET.  5. AAA- she remembers it being 5cm. Abd aortic ultrasound stable 5cm on 10/09/15. She says been 7 years since evaluation.   OK for transfer to tele.    Donato Schultz, MD  10/10/2015  10:52 AM

## 2015-10-10 NOTE — Consult Note (Signed)
Reason for Consult:CAD Referring Physician: Dr. Nils FlackSkains  Tonya Payne is an 81 y.o. female.  HPI: 81 yo woman admitted with a cc/o CP  Tonya Payne is a 81 yo woman with a history of tobacco abuse, COPD, TIA, IBS depression and anxiety. She had no cardiac history prior to this admission.   She presented 6/2/2017with a cc/o chest pain. She felt like something was sitting on Tonya Payne chest. She felt short of breath and had nausea. She called EMS. She had ST elevation in V4-6. Tonya Payne pain improved but did not resolve with SL NTG. She was taken urgently to the cath lab and found to have severe disease in the LAD. Angioplasty was performed but there was incomplete expansion and a stent was not placed. She has a 75% left main stenosis, multiple calcified lesions in the LAD and a 50% stenosis in a large dominant LAD. The circumflex is a very small vessel.  She has been pain free since the cath.  She had hematuria and dysuria PTA and was being treated for a UTI . Past Medical History  Diagnosis Date  . COPD (chronic obstructive pulmonary disease) (HCC)   . Anxiety   . Depression   . TIA (transient ischemic attack)   . IBS (irritable bowel syndrome)   . Oral thrush   . CAD (coronary artery disease), native coronary artery 10/08/2015  . Congestive dilated cardiomyopathy (HCC) 10/08/2015  . Hyperlipidemia LDL goal <70 10/08/2015    Past Surgical History  Procedure Laterality Date  . Back surgery    . Neck surgery    . Colectomy    . Cardiac catheterization N/A 10/06/2015    Procedure: Left Heart Cath and Coronary Angiography;  Surgeon: Marykay Lexavid W Harding, MD;  [facility];  Service: Cardiovascular;  Laterality: N/A;  . Cardiac catheterization N/A 10/06/2015    Procedure: Coronary Balloon Angioplasty;  Surgeon: Marykay Lexavid W Harding, MD;  [facility];  Service: Cardiovascular;  Laterality: N/A;    Family History  Problem Relation Age of Onset  . Stroke Mother     dead  . Clotting  disorder Father     dead  bone marrow disease    Social History:  reports that she has been smoking Cigarettes.  She has been smoking about 0.50 packs per day. She has never used smokeless tobacco. She reports that she drinks about 1.2 oz of alcohol per week. She reports that she does not use illicit drugs.  Allergies: No Known Allergies  Medications:  Scheduled: . aspirin  81 mg Oral Daily  . atorvastatin  80 mg Oral q1800  . metoprolol tartrate  12.5 mg Oral BID    Results for orders placed or performed during the hospital encounter of 10/06/15 (from the past 48 hour(s))  CBC     Status: Abnormal   Collection Time: 10/09/15  3:10 AM  Result Value Ref Range   WBC 6.5 4.0 - 10.5 K/uL   RBC 3.44 (L) 3.87 - 5.11 MIL/uL   Hemoglobin 10.5 (L) 12.0 - 15.0 g/dL   HCT 13.032.9 (L) 86.536.0 - 78.446.0 %   MCV 95.6 78.0 - 100.0 fL   MCH 30.5 26.0 - 34.0 pg   MCHC 31.9 30.0 - 36.0 g/dL   RDW 69.614.0 29.511.5 - 28.415.5 %   Platelets 272 150 - 400 K/uL  Heparin level (unfractionated)     Status: Abnormal   Collection Time: 10/09/15  3:10 AM  Result Value Ref Range  Heparin Unfractionated 0.81 (H) 0.30 - 0.70 IU/mL    Comment:        IF HEPARIN RESULTS ARE BELOW EXPECTED VALUES, AND PATIENT DOSAGE HAS BEEN CONFIRMED, SUGGEST FOLLOW UP TESTING OF ANTITHROMBIN III LEVELS.   Heparin level (unfractionated)     Status: None   Collection Time: 10/09/15 11:53 AM  Result Value Ref Range   Heparin Unfractionated 0.51 0.30 - 0.70 IU/mL    Comment:        IF HEPARIN RESULTS ARE BELOW EXPECTED VALUES, AND PATIENT DOSAGE HAS BEEN CONFIRMED, SUGGEST FOLLOW UP TESTING OF ANTITHROMBIN III LEVELS.   Heparin level (unfractionated)     Status: None   Collection Time: 10/09/15  7:15 PM  Result Value Ref Range   Heparin Unfractionated 0.60 0.30 - 0.70 IU/mL    Comment:        IF HEPARIN RESULTS ARE BELOW EXPECTED VALUES, AND PATIENT DOSAGE HAS BEEN CONFIRMED, SUGGEST FOLLOW UP TESTING OF ANTITHROMBIN III  LEVELS.   CBC     Status: Abnormal   Collection Time: 10/10/15  2:41 AM  Result Value Ref Range   WBC 5.7 4.0 - 10.5 K/uL   RBC 3.53 (L) 3.87 - 5.11 MIL/uL   Hemoglobin 10.8 (L) 12.0 - 15.0 g/dL   HCT 21.3 (L) 08.6 - 57.8 %   MCV 94.9 78.0 - 100.0 fL   MCH 30.6 26.0 - 34.0 pg   MCHC 32.2 30.0 - 36.0 g/dL   RDW 46.9 62.9 - 52.8 %   Platelets 266 150 - 400 K/uL  Heparin level (unfractionated)     Status: None   Collection Time: 10/10/15  2:41 AM  Result Value Ref Range   Heparin Unfractionated 0.47 0.30 - 0.70 IU/mL    Comment:        IF HEPARIN RESULTS ARE BELOW EXPECTED VALUES, AND PATIENT DOSAGE HAS BEEN CONFIRMED, SUGGEST FOLLOW UP TESTING OF ANTITHROMBIN III LEVELS.     US Aorta  10/09/2015  CLINICAL DATA:  Aortic aneurysm, follow-up. Recently scratch the previous tobacco abuse. EXAM: ULTRASOUND OF ABDOMINAL AORTA TECHNIQUE: Ultrasound examination of the abdominal aorta was performed to evaluate for abdominal aortic aneurysm. COMPARISON:  CT 08/17/2014 and previous FINDINGS: Abdominal Aorta Distal fusiform dilatation to maximum AP diameter of 5 cm (stable since prior CT) extending over length of at least 8 cm. Right common iliac artery measures 11 mm diameter, left 9 mm. IMPRESSION: 1. Stable 5 cm distal abdominal aortic fusiform aneurysm. Recommend followup by abdomen and pelvis CTA in 3-6 months, and vascular surgery referral/consultation if not already obtained. This recommendation follows ACR consensus guidelines: White Paper of the ACR Incidental Findings Committee II on Vascular Findings. J Am Coll Radiol 2013; 10:789-794. Electronically Signed   By: [name] M.D.   On: 10/09/2015 16:18    Review of Systems  Constitutional: Negative for fever and chills.  Respiratory: Positive for cough and shortness of breath. Negative for wheezing.   Cardiovascular: Positive for chest pain. Negative for orthopnea and leg swelling.  Gastrointestinal: Positive for nausea. Negative for  vomiting.  Genitourinary: Positive for dysuria and hematuria.  Neurological: Negative for speech change and loss of consciousness.  Psychiatric/Behavioral: Positive for depression. The patient is nervous/anxious.    Blood pressure 133/91, pulse 78, temperature 98.1 F (36.7 C), temperature source Oral, resp. rate 17, height 4' 11.84" (1.52 m), weight 109 lb 5.6 oz (49.6 kg), SpO2 100 %. Physical Exam  Vitals reviewed. Constitutional: She is oriented to person, place, and  time.  Elderly  HENT:  Head: Normocephalic and atraumatic.  Mouth/Throat: No oropharyngeal exudate.  Eyes: Conjunctivae and EOM are normal. No scleral icterus.  Neck: Neck supple. No thyromegaly present.  Cardiovascular: Normal rate, regular rhythm and normal heart sounds.  Exam reveals no gallop and no friction rub.   No murmur heard. Pulses:      Dorsalis pedis pulses are 0 on the right side, and 2+ on the left side.       Posterior tibial pulses are 1+ on the right side, and 2+ on the left side.  Respiratory: Effort normal and breath sounds normal. No respiratory distress. She has no wheezes. She has no rales.  GI: Soft. She exhibits no distension. There is no tenderness.  Musculoskeletal: She exhibits no edema.  Lymphadenopathy:    She has no cervical adenopathy.  Neurological: She is alert and oriented to person, place, and time. No cranial nerve deficit.  No focal motor deficit  Skin: Skin is warm and dry.   ECHOCARDIOGRAM 6/5 2017 Conclusions    Result status: Final result   1. Left ventricle: Normal cavity size. Concentric hypertrophy of moderate severity. Measured LVEF is 40%. Grade I (mild, abnormal relaxation) left ventricular diastolic dysfunction. Elevated left atrial pressure. Wall motion is abnormal. No thrombus present. 2. Mitral valve: No leaflet thickening and calcification present. Mild regurgitation. 3. Right ventricle: Normal cavity size, wall thickness and ejection fraction. No thrombus  present. No mass present. 4. Pericardium: Trivial pericardial effusion. 5. Tricuspid valve: Mild regurgitation.  Wall motion abnormalities consistent with infarct or stunning in the mid LAD territory. LVEF 40%.    CARDIAC CATHETERIZATION Conclusion    6. Mid LAD lesion, 95% stenosed. Post intervention, there is a 70% residual stenosis. Heavily calcified 7. LM lesion, 75% stenosed. Heavily calcified. Ost-proximal LAD lesion, 75% stenosed. Heavily calcified 8. Prox RCA to Mid RCA lesion, 50% stenosed. Heavily calcified 9. There is severe left ventricular systolic dysfunction. EF 20-25% with essentially apical akinesis and distal anterior hypokinesis.  Severe ischemic cardiomyopathy with EF of 20-25% and extensive disease in the distal left main and 2 areas in the proximal and mid LAD. All lesions are extensively calcified and would not likely allow for appropriate stent expansion without rotational atherectomy.  After minimally successful PTCA of the distal LAD lesion, the decision was made to abort further intervention with plans to place the patient on Aggrastat infusion with heparin for 18 hours. She would then be optimized on medical management with Brilinta, statin, beta blocker etc. She would then he'll return in roughly a week or 2 for potential rotational atherectomy of the LAD with distal left main. However after reviewing Tonya Payne left ventriculography, this may require mechanical support given Tonya Payne severe ischemic cardiomyopathy.  Plan:  Transferred to ICU for post radial cath care. She will stay on bed rest.  Aggrastat and heparin for minimum of 18 hours.  Check 2-D echocardiogram - cannot exclude Takotsubo pattern as the etiology for Tonya Payne event, with LAD lesions as Happenstance.  Continue to titrate supportive measures. - High-dose statin, aspirin, Brilinta  We'll need to discuss these findings with interventional colleagues to determine the best course of action. Would also need  to get a better sense for the patient and family their wishes as far as moving forward. The circumflex is certainly not a significant lesion and could easily be stented across, would not recommend CABG.   Bryan Lemma, M.D., M.S. Interventional Cardiologist    I personally reviewed the catheterization  and echo images and concur with the findings above. However, I do think CABG is an option.  Assessment/Plan: 81 yo woman with multiple CRF who presented with a STEMI. She has left main, severe LAD disease and moderate RCA disease. I think CABG is a better option than complex PCI for treatment of the LAD and diagonal. I think Tonya Payne circumflex is too small to graft. Tonya Payne RCA disease is moderate but given the difficulty with PTCA in the LAD, it is probably best to go ahead and bypass it at the time of surgery.  I discussed the general nature of the procedure, the need for general anesthesia, the use of cardiopulmonary bypass, and the incisions to be used with Tonya Payne. We discussed the expected hospital stay, overall recovery and short and long term outcomes. I reviewed the indications, risks, benefits and alternatives. She understands the risks include, but are not limited to death, stroke, MI, DVT/PE, bleeding, possible need for transfusion, infections, cardiac arrhythmias, and other organ system dysfunction including respiratory, renal, or GI complications. She accepts the risks and agrees to proceed.  She has been Brillinta and will need that to "wash out" prior to CABG.  Timing of surgery at present looks like it will probably be Monday Loreli Slot 10/10/2015, 5:28 PM

## 2015-10-10 NOTE — Progress Notes (Signed)
    Stopped Brilinta in anticipation of possible CABG. Last dose 10/09/15, PM. Called in TCTS consult 10/09/15.   Donato SchultzMark Skains, MD

## 2015-10-10 NOTE — Progress Notes (Signed)
CARDIAC REHAB PHASE I   PRE:  Rate/Rhythm: 74 SR    BP: sitting 121/71    SaO2: 99 RA  MODE:  Ambulation: 450 ft   POST:  Rate/Rhythm: 86 SR    BP: sitting 129/53     SaO2: 99 RA  Pt eager to walk, would like to make her bowels move. Fairly steady with rollator however she slip at EOB trying to get up in bed (short stature). Pt rested x2 for SOB but increased distance. Denied CP. Discussed sternal precautions, mobility, and d/c planning post op. Will continue to follow. Encouraged more walking. [id number]   Harriet MassonRandi Kristan Wilson Sample CES, ACSM 10/10/2015 2:49 PM

## 2015-10-10 NOTE — Progress Notes (Signed)
ANTICOAGULATION CONSULT NOTE  Pharmacy Consult for Heparin  Indication: multivessel CAD  No Known Allergies  Patient Measurements: Height: 4' 11.84" (152 cm) Weight: 109 lb 5.6 oz (49.6 kg) IBW/kg (Calculated) : 45.14 Weight: 49kg  Vital Signs: Temp: 97.9 F (36.6 C) (06/06 0700) Temp Source: Oral (06/06 0700) BP: 145/75 mmHg (06/06 0800) Pulse Rate: 81 (06/06 0800)  Labs:  Recent Labs  10/07/15 0924 10/07/15 1619  10/08/15 0640  10/08/15 0717  10/09/15 0310 10/09/15 1153 10/09/15 1915 10/10/15 0241  HGB  --   --   < > 10.7*  --   --   --  10.5*  --   --  10.8*  HCT  --   --   --  33.1*  --   --   --  32.9*  --   --  33.5*  PLT  --   --   --  247  --   --   --  272  --   --  266  HEPARINUNFRC 0.18*  --   < >  --   < >  --   < > 0.81* 0.51 0.60 0.47  CREATININE  --   --   --   --   --  1.55*  --   --   --   --   --   TROPONINI 3.27* 2.67*  --   --   --   --   --   --   --   --   --   < > = values in this interval not displayed.  Estimated Creatinine Clearance: 21 mL/min (by C-G formula based on Cr of 1.55).   Medical History: Past Medical History  Diagnosis Date  . COPD (chronic obstructive pulmonary disease) (HCC)   . Anxiety   . Depression   . TIA (transient ischemic attack)   . IBS (irritable bowel syndrome)   . Oral thrush   . CAD (coronary artery disease), native coronary artery 10/08/2015  . Congestive dilated cardiomyopathy (HCC) 10/08/2015  . Hyperlipidemia LDL goal <70 10/08/2015   Assessment: 81yof presented as a Code STEMI and taken directly to cath lab where she was found to have 95% stenosed mid LAD, 75% stenosed post-proximal LAD, and 75% stenosed left main. PTCA was unsuccessful due to heavy calcification of the lesions. Pharmacy dosing heparin post-cath with plans for possible rotational atherectomy vs surgery. No AC pta. Stopped Brilinta 6/5 in anticipation of possible CABG, TCTS consulted.  Heparin level remains therapeutic 0.47 on 800 units/h. Hg  low stable, plt wnl. No bleed documented.  Goal of Therapy:  Heparin level 0.3-0.7 units/ml Monitor platelets by anticoagulation protocol: Yes   Plan:   -Heparin at 800 units/h -Daily HL/CBC -Monitor s/sx bleeding -F/u TCTS recs  Babs BertinHaley Cindia Hustead, PharmD, BCPS Clinical Pharmacist Pager [phone] 10/10/2015 8:15 AM

## 2015-10-11 ENCOUNTER — Other Ambulatory Visit (HOSPITAL_COMMUNITY): Payer: PRIVATE HEALTH INSURANCE

## 2015-10-11 DIAGNOSIS — I25110 Atherosclerotic heart disease of native coronary artery with unstable angina pectoris: Secondary | ICD-10-CM

## 2015-10-11 LAB — HEPARIN LEVEL (UNFRACTIONATED): HEPARIN UNFRACTIONATED: 0.42 [IU]/mL (ref 0.30–0.70)

## 2015-10-11 LAB — CBC
HCT: 33.4 % — ABNORMAL LOW (ref 36.0–46.0)
HEMOGLOBIN: 10.7 g/dL — AB (ref 12.0–15.0)
MCH: 30.6 pg (ref 26.0–34.0)
MCHC: 32 g/dL (ref 30.0–36.0)
MCV: 95.4 fL (ref 78.0–100.0)
Platelets: 271 10*3/uL (ref 150–400)
RBC: 3.5 MIL/uL — ABNORMAL LOW (ref 3.87–5.11)
RDW: 14 % (ref 11.5–15.5)
WBC: 6.4 10*3/uL (ref 4.0–10.5)

## 2015-10-11 MED ORDER — OLANZAPINE 5 MG PO TABS
5.0000 mg | ORAL_TABLET | Freq: Every day | ORAL | Status: DC
  Administered 2015-10-11 – 2015-10-15 (×5): 5 mg via ORAL
  Filled 2015-10-11 (×5): qty 1

## 2015-10-11 MED ORDER — SERTRALINE HCL 50 MG PO TABS
50.0000 mg | ORAL_TABLET | Freq: Every day | ORAL | Status: DC
  Administered 2015-10-12 – 2015-10-15 (×4): 50 mg via ORAL
  Filled 2015-10-11 (×4): qty 1

## 2015-10-11 NOTE — Progress Notes (Signed)
5 Days Post-Op Procedure(s) (LRB): Left Heart Cath and Coronary Angiography (N/A) Coronary Balloon Angioplasty (N/A) Subjective: Denies CP, SOB  Objective: Vital signs in last 24 hours: Temp:  [97.7 F (36.5 C)-98.1 F (36.7 C)] 97.8 F (36.6 C) (06/07 0636) Pulse Rate:  [67-87] 83 (06/07 1131) Cardiac Rhythm:  [-] Normal sinus rhythm (06/07 0700) Resp:  [14-32] 20 (06/07 0636) BP: (90-158)/(59-93) 155/73 mmHg (06/07 1131) SpO2:  [97 %-100 %] 98 % (06/07 0636) Weight:  [101 lb 11.2 oz (46.131 kg)] 101 lb 11.2 oz (46.131 kg) (06/07 0034)  Hemodynamic parameters for last 24 hours:    Intake/Output from previous day: 06/06 0701 - 06/07 0700 In: 966 [P.O.:660; I.V.:306] Out: 701 [Urine:700; Stool:1] Intake/Output this shift: Total I/O In: 120 [P.O.:120] Out: -   General appearance: alert, cooperative and no distress Heart: regular rate and rhythm Lungs: clear to auscultation bilaterally  Lab Results:  Recent Labs  10/10/15 0241 10/11/15 0301  WBC 5.7 6.4  HGB 10.8* 10.7*  HCT 33.5* 33.4*  PLT 266 271   BMET: No results for input(s): NA, K, CL, CO2, GLUCOSE, BUN, CREATININE, CALCIUM in the last 72 hours.  PT/INR: No results for input(s): LABPROT, INR in the last 72 hours. ABG    Component Value Date/Time   TCO2 22 10/06/2015 1501   CBG (last 3)  No results for input(s): GLUCAP in the last 72 hours.  Assessment/Plan: S/P Procedure(s) (LRB): Left Heart Cath and Coronary Angiography (N/A) Coronary Balloon Angioplasty (N/A) -  CAD s/p STEMI For CABG after Brillinta wash out OR Monday    LOS: 5 days    Loreli SlotSteven C Tiarah Shisler 10/11/2015

## 2015-10-11 NOTE — Progress Notes (Signed)
SUBJECTIVE:  No CP, no SOB.  Seems quite comfortable. In chair. A "little wobbly" with walking.   OBJECTIVE:   Vitals:   Filed Vitals:   10/11/15 0000 10/11/15 0034 10/11/15 0636 10/11/15 1131  BP: 122/72 144/71 125/69 155/73  Pulse: 67 76 81 83  Temp:  97.7 F (36.5 C) 97.8 F (36.6 C)   TempSrc:  Oral Oral   Resp: Height:      Weight:  101 lb 11.2 oz (46.131 kg)    SpO2: 97% 99% 98%    I&O's:    Intake/Output Summary (Last 24 hours) at 10/11/15 1145 Last data filed at 10/11/15 0825  Gross per 24 hour  Intake    654 ml  Output    251 ml  Net    403 ml   TELEMETRY: Personally Reviewed telemetry pt in NSR     PHYSICAL EXAM General: Well developed, well nourished, in no acute distress Head: Eyes PERRLA, No xanthomas.   Normal cephalic and atramatic  Lungs:   Clear bilaterally to auscultation and percussion. Heart:   HRRR S1 S2, +S4 Pulses are 2+ & equal. Abdomen: Bowel sounds are positive, abdomen soft and non-tender without masses Extremities:   No clubbing, cyanosis or edema.  DP +1 Neuro: Alert and oriented X 3. Psych:  Good affect, responds appropriately   LABS: Basic Metabolic Panel: No results for input(s): NA, K, CL, CO2, GLUCOSE, BUN, CREATININE, CALCIUM, MG, PHOS in the last 72 hours. Liver Function Tests: No results for input(s): AST, ALT, ALKPHOS, BILITOT, PROT, ALBUMIN in the last 72 hours. No results for input(s): LIPASE, AMYLASE in the last 72 hours. CBC:  Recent Labs  10/10/15 0241 10/11/15 0301  WBC 5.7 6.4  HGB 10.8* 10.7*  HCT 33.5* 33.4*  MCV 94.9 95.4  PLT 266 271   Cardiac Enzymes: No results for input(s): CKTOTAL, CKMB, CKMBINDEX, TROPONINI in the last 72 hours. BNP: Invalid input(s): POCBNP D-Dimer: No results for input(s): DDIMER in the last 72 hours. Hemoglobin A1C: No results for input(s): HGBA1C in the last 72 hours. Fasting Lipid Panel: No results for input(s): CHOL, HDL, LDLCALC, TRIG, CHOLHDL, LDLDIRECT in  the last 72 hours. Thyroid Function Tests: No results for input(s): TSH, T4TOTAL, T3FREE, THYROIDAB in the last 72 hours.  Invalid input(s): FREET3 Anemia Panel: No results for input(s): VITAMINB12, FOLATE, FERRITIN, TIBC, IRON, RETICCTPCT in the last 72 hours. Coag Panel:   Lab Results  Component Value Date   INR 1.02 10/06/2015   INR 0.97 02/25/2015    RADIOLOGY: US Aorta  10/09/2015  CLINICAL DATA:  Aortic aneurysm, follow-up. Recently scratch the previous tobacco abuse. EXAM: ULTRASOUND OF ABDOMINAL AORTA TECHNIQUE: Ultrasound examination of the abdominal aorta was performed to evaluate for abdominal aortic aneurysm. COMPARISON:  CT 08/17/2014 and previous FINDINGS: Abdominal Aorta Distal fusiform dilatation to maximum AP diameter of 5 cm (stable since prior CT) extending over length of at least 8 cm. Right common iliac artery measures 11 mm diameter, left 9 mm. IMPRESSION: 1. Stable 5 cm distal abdominal aortic fusiform aneurysm. Recommend followup by abdomen and pelvis CTA in 3-6 months, and vascular surgery referral/consultation if not already obtained. This recommendation follows ACR consensus guidelines: White Paper of the ACR Incidental Findings Committee II on Vascular Findings. J Am Coll Radiol 2013; 10:789-794. Electronically Signed   By: [name] M.D.   On: 10/09/2015 16:18   Dg Chest Port 1 View  10/06/2015  CLINICAL DATA:  Post  cardiac catheterization EXAM: PORTABLE CHEST 1 VIEW COMPARISON:  08/17/2014 FINDINGS: Mild hyperinflation of the lungs. Heart is borderline enlarged. Biapical pleural thickening. Scarring in the lung bases. No acute airspace opacities or effusions. No acute bony abnormality. No pneumothorax. IMPRESSION: COPD/chronic changes.  Borderline heart size. Electronically Signed   By: Charlett NoseKevin  Dover M.D.   On: 10/06/2015 19:24   Scheduled Meds: . aspirin  81 mg Oral Daily  . atorvastatin  80 mg Oral q1800  . metoprolol tartrate  12.5 mg Oral BID   Continuous  Infusions: . sodium chloride    . sodium chloride 10 mL/hr at 10/11/15 0000  . heparin 800 Units/hr (10/11/15 0000)  . nitroGLYCERIN Stopped (10/07/15 0930)   PRN Meds:.acetaminophen, ALPRAZolam, nitroGLYCERIN, ondansetron (ZOFRAN) IV, zolpidem  ASSESSMENT AND PLAN  1. Acute STEMI, s/pPTCA only to 95% mid LAD with 70% residual stenosis- note Dr. Erich MontaneHardings report of heavily Calcified coronary arteries with 75% LM lesion and 75% ost-prox LAD. She is pain free and troponin only mildly elevated (peak 3.27).  Continue ASA/statin/BB/IV NTG and Heparin gtts. Holding Brilinta prior to CABG (last dose 10/09/15).  Dr. Dorris FetchHendrickson.  CABG Monday.   2.  Severe ischemic DCM with EF 20-25% on cath improved to 40% on Echo (likely stunning as well).  Continue low dose metoprolol and consider changing to Coreg once BP stable.  No ACE I due to elevated creatinine and soft BP.  3.  Dyslipidemia -  high dose statin therapy.  4.  Acute kidney injury secondary to acute MI and contrast dye exposure.  Continue to follow creatinine. Improved 1.5 from 1.9  LVEDP 17 at cath.  No evidence of CHF on exam. BMET in AM  5. AAA- she remembers it being 5cm. Abd aortic ultrasound stable 5cm on 10/09/15. She says been 7 years since evaluation.    Donato SchultzMark Kaydyn Sayas, MD  10/11/2015  11:45 AM

## 2015-10-11 NOTE — Progress Notes (Signed)
CARDIAC REHAB PHASE I   PRE:  Rate/Rhythm: 79 SR    SaO2: 95%RA  MODE:  Ambulation: 250 ft   POST:  Rate/Rhythm: 97  BP:  Supine:   Sitting: 153/74  Standing:    SaO2: 96%RA 1102-1138 Pt wanted to brush teeth prior to walk. Pt stood with assistance and very wobbly. Had to be steadied as she was leaning backward. Put gait belt on and assisted to sink . Pt walked 250 ft on RA with rollator and asst x 1. Pt stated unit rollator too big for her. Will bring our smaller rollator when we walk again. Tired and stated could not go as far as yesterday. To recliner. Gave OHS booklet, care guide and wrote down how to view pre op video.   Luetta Nuttingharlene Shelvy Heckert, RN BSN  10/11/2015 11:35 AM

## 2015-10-11 NOTE — Progress Notes (Signed)
Pt transferred to 2W30 in stable condition on room air NSR, report given to [place], Charity fundraiserN. RN & NT at bedside to receive patient. Pt placed on tele with NT. Personal belongings of cellphone, charger, purse and clothing sent with patient. Informed receiving RN of pt's wallet which is with security and yellow sheet in front of chart.

## 2015-10-11 NOTE — Progress Notes (Signed)
ANTICOAGULATION CONSULT NOTE  Pharmacy Consult for Heparin  Indication: multivessel CAD  No Known Allergies  Patient Measurements: Height: 4' 11.84" (152 cm) Weight: 101 lb 11.2 oz (46.131 kg) IBW/kg (Calculated) : 45.14 Weight: 49kg  Vital Signs: Temp: 97.8 F (36.6 C) (06/07 0636) Temp Source: Oral (06/07 0636) BP: 125/69 mmHg (06/07 0636) Pulse Rate: 81 (06/07 0636)  Labs:  Recent Labs  10/09/15 0310  10/09/15 1915 10/10/15 0241 10/11/15 0301  HGB 10.5*  --   --  10.8* 10.7*  HCT 32.9*  --   --  33.5* 33.4*  PLT 272  --   --  266 271  HEPARINUNFRC 0.81*  < > 0.60 0.47 0.42  < > = values in this interval not displayed.  Estimated Creatinine Clearance: 21 mL/min (by C-G formula based on Cr of 1.55).   Medical History: Past Medical History  Diagnosis Date  . COPD (chronic obstructive pulmonary disease) (HCC)   . Anxiety   . Depression   . TIA (transient ischemic attack)   . IBS (irritable bowel syndrome)   . Oral thrush   . CAD (coronary artery disease), native coronary artery 10/08/2015  . Congestive dilated cardiomyopathy (HCC) 10/08/2015  . Hyperlipidemia LDL goal <70 10/08/2015   Assessment: 81yof presented as a Code STEMI and taken directly to cath lab where she was found to have 95% stenosed mid LAD, 75% stenosed post-proximal LAD, and 75% stenosed left main. PTCA was unsuccessful due to heavy calcification of the lesions. No AC pta. Stopped Brilinta 6/5 in anticipation of CABG Monday  Heparin level remains therapeutic 0.4 on 800 units/h. Hg low stable, plt wnl. No bleed documented.  Goal of Therapy:  Heparin level 0.3-0.7 units/ml Monitor platelets by anticoagulation protocol: Yes   Plan:   -Heparin at 800 units/hr -Daily HL/CBC -Monitor s/sx bleeding -Plan for CABG 6/12  Sheppard CoilFrank Merlene Dante PharmD., BCPS Clinical Pharmacist Pager [phone] 10/11/2015 10:34 AM

## 2015-10-12 ENCOUNTER — Inpatient Hospital Stay (HOSPITAL_COMMUNITY): Payer: Medicare Other

## 2015-10-12 DIAGNOSIS — I2510 Atherosclerotic heart disease of native coronary artery without angina pectoris: Secondary | ICD-10-CM

## 2015-10-12 LAB — CBC
HEMATOCRIT: 33.2 % — AB (ref 36.0–46.0)
Hemoglobin: 10.7 g/dL — ABNORMAL LOW (ref 12.0–15.0)
MCH: 30.4 pg (ref 26.0–34.0)
MCHC: 32.2 g/dL (ref 30.0–36.0)
MCV: 94.3 fL (ref 78.0–100.0)
Platelets: 266 10*3/uL (ref 150–400)
RBC: 3.52 MIL/uL — ABNORMAL LOW (ref 3.87–5.11)
RDW: 13.8 % (ref 11.5–15.5)
WBC: 5.3 10*3/uL (ref 4.0–10.5)

## 2015-10-12 LAB — BASIC METABOLIC PANEL
Anion gap: 7 (ref 5–15)
BUN: 9 mg/dL (ref 6–20)
CHLORIDE: 108 mmol/L (ref 101–111)
CO2: 19 mmol/L — AB (ref 22–32)
CREATININE: 1.17 mg/dL — AB (ref 0.44–1.00)
Calcium: 9.1 mg/dL (ref 8.9–10.3)
GFR calc Af Amer: 50 mL/min — ABNORMAL LOW (ref >60)
GFR calc non Af Amer: 43 mL/min — ABNORMAL LOW (ref >60)
GLUCOSE: 100 mg/dL — AB (ref 65–99)
Potassium: 3.7 mmol/L (ref 3.5–5.1)
Sodium: 134 mmol/L — ABNORMAL LOW (ref 135–145)

## 2015-10-12 LAB — HEPARIN LEVEL (UNFRACTIONATED): Heparin Unfractionated: 0.46 IU/mL (ref 0.30–0.70)

## 2015-10-12 NOTE — Progress Notes (Signed)
ANTICOAGULATION CONSULT NOTE  Pharmacy Consult for Heparin  Indication: multivessel CAD  No Known Allergies  Patient Measurements: Height: 4' 11.84" (152 cm) Weight: 107 lb 14.4 oz (48.943 kg) IBW/kg (Calculated) : 45.14 Weight: 49kg  Vital Signs: Temp: 98.1 F (36.7 C) (06/08 0538) Temp Source: Oral (06/08 0538) BP: 128/70 mmHg (06/08 0953) Pulse Rate: 79 (06/08 0953)  Labs:  Recent Labs  10/10/15 0241 10/11/15 0301 10/12/15 0300  HGB 10.8* 10.7* 10.7*  HCT 33.5* 33.4* 33.2*  PLT 266 271 266  HEPARINUNFRC 0.47 0.42 0.46  CREATININE  --   --  1.17*    Estimated Creatinine Clearance: 27.8 mL/min (by C-G formula based on Cr of 1.17).  Assessment: 81yof presented as a Code STEMI and taken directly to cath lab where she was found to have 95% stenosed mid LAD, 75% stenosed post-proximal LAD, and 75% stenosed left main. PTCA was unsuccessful due to heavy calcification of the lesions. No AC pta. Stopped Brilinta 6/5 in anticipation of CABG Monday.  Heparin level remains therapeutic 0.46 on 800 units/h. Hg low stable, plt wnl. No bleed documented.  Goal of Therapy:  Heparin level 0.3-0.7 units/ml Monitor platelets by anticoagulation protocol: Yes   Plan:   - Continue Heparin at 800 units/hr - Daily HL/CBC - Monitor s/sx bleeding - Plan for CABG 6/12  Lysle Pearlachel Aviyanna Colbaugh, PharmD, BCPS Pager # [phone] 10/12/2015 10:56 AM

## 2015-10-12 NOTE — Progress Notes (Signed)
Pre-op Cardiac Surgery  Carotid Findings:  Bilateral: 1-39% ICA stenosis.  Vertebral artery flow is antegrade.  Cannot rule out ulcerated plaque in the left CCA.   Upper Extremity Right Left  Brachial Pressures 162T 150T  Radial Waveforms T T  Ulnar Waveforms T T  Palmar Arch (Allen's Test) Doppler signal diminishes 50% with radial compression and reverses with ulnar compression. WNL   Findings:      Lower  Extremity Right Left  Dorsalis Pedis    Anterior Tibial B B  Posterior Tibial B B  Ankle/Brachial Indices      Findings:

## 2015-10-12 NOTE — Progress Notes (Signed)
1114 Came to see to walk but pt off floor. Will return as time permits. Luetta NuttingCharlene Prince Couey RN BSN 10/12/2015 11:15 AM

## 2015-10-12 NOTE — Progress Notes (Signed)
SUBJECTIVE:  No CP, no SOB.  Had ultrasounds done. No complaints. Eating lunch. OBJECTIVE:   Vitals:   Filed Vitals:   10/11/15 1421 10/11/15 2016 10/12/15 0538 10/12/15 0953  BP: 132/77 135/75 122/70 128/70  Pulse: 72 74 83 79  Temp: 97.7 F (36.5 C) 97.7 F (36.5 C) 98.1 F (36.7 C)   TempSrc: Oral Oral Oral   Resp: 20 18 16    Height:      Weight:   107 lb 14.4 oz (48.943 kg)   SpO2: 98% 100% 99%    I&O's:    Intake/Output Summary (Last 24 hours) at 10/12/15 1235 Last data filed at 10/12/15 0816  Gross per 24 hour  Intake    600 ml  Output      0 ml  Net    600 ml   TELEMETRY: Personally Reviewed telemetry pt in NSR     PHYSICAL EXAM General: Well developed, well nourished, in no acute distress Head: Eyes PERRLA, No xanthomas.   Normal cephalic and atramatic  Lungs:   Clear bilaterally to auscultation and percussion. Heart:   HRRR S1 S2, +S4 Pulses are 2+ & equal. Abdomen: Bowel sounds are positive, abdomen soft and non-tender without masses Extremities:   No clubbing, cyanosis or edema.  DP +1 Neuro: Alert and oriented X 3. Psych:  Good affect, responds appropriately   LABS: Basic Metabolic Panel:  Recent Labs  81/19/1404/12/20 0300  NA 134*  K 3.7  CL 108  CO2 19*  GLUCOSE 100*  BUN 9  CREATININE 1.17*  CALCIUM 9.1     Recent Labs  10/11/15 0301 10/12/15 0300  WBC 6.4 5.3  HGB 10.7* 10.7*  HCT 33.4* 33.2*  MCV 95.4 94.3  PLT 271 266    RADIOLOGY: [place] Aorta  10/09/2015  CLINICAL DATA:  Aortic aneurysm, follow-up. Recently scratch the previous tobacco abuse. EXAM: ULTRASOUND OF ABDOMINAL AORTA TECHNIQUE: Ultrasound examination of the abdominal aorta was performed to evaluate for abdominal aortic aneurysm. COMPARISON:  CT 08/17/2014 and previous FINDINGS: Abdominal Aorta Distal fusiform dilatation to maximum AP diameter of 5 cm (stable since prior CT) extending over length of at least 8 cm. Right common iliac artery measures 11 mm diameter, left 9 mm.  IMPRESSION: 1. Stable 5 cm distal abdominal aortic fusiform aneurysm. Recommend followup by abdomen and pelvis CTA in 3-6 months, and vascular surgery referral/consultation if not already obtained. This recommendation follows ACR consensus guidelines: White Paper of the ACR Incidental Findings Committee II on Vascular Findings. J Am Coll Radiol 2013; 10:789-794. Electronically Signed   By: Tonya Leak  Payne M.D.   On: 10/09/2015 16:18   Dg Chest Port 1 View  10/06/2015  CLINICAL DATA:  Post cardiac catheterization EXAM: PORTABLE CHEST 1 VIEW COMPARISON:  08/17/2014 FINDINGS: Mild hyperinflation of the lungs. Heart is borderline enlarged. Biapical pleural thickening. Scarring in the lung bases. No acute airspace opacities or effusions. No acute bony abnormality. No pneumothorax. IMPRESSION: COPD/chronic changes.  Borderline heart size. Electronically Signed   By: Tonya Payne  Dover M.D.   On: 10/06/2015 19:24   Scheduled Meds: . aspirin  81 mg Oral Daily  . atorvastatin  80 mg Oral q1800  . metoprolol tartrate  12.5 mg Oral BID  . OLANZapine  5 mg Oral QHS  . sertraline  50 mg Oral Daily   Continuous Infusions: . sodium chloride    . sodium chloride 10 mL/hr at 10/11/15 0000  . heparin 800 Units/hr (10/12/15 0711)  . nitroGLYCERIN Stopped (10/07/15  0930)   PRN Meds:.acetaminophen, ALPRAZolam, nitroGLYCERIN, ondansetron (ZOFRAN) IV, zolpidem  ASSESSMENT AND PLAN-CABG pending  1. Acute STEMI, s/pPTCA only to 95% mid LAD with 70% residual stenosis- note Dr. Erich Montane report of heavily Calcified coronary arteries with 75% LM lesion and 75% ost-prox LAD. She is pain free and troponin only mildly elevated (peak 3.27).  Continue ASA/statin/BB/IV NTG and Heparin gtts. Holding Brilinta prior to CABG (last dose 10/09/15).  Dr. [name].  CABG Monday.   2.  Severe ischemic DCM with EF 20-25% on cath improved to 40% on Echo (likely stunning as well).  Continue low dose metoprolol and consider changing to Coreg once BP  stable.  No ACE I due to elevated creatinine and soft BP.  3.  Dyslipidemia -  high dose statin therapy.  4.  Acute kidney injury secondary to acute MI and contrast dye exposure.  Continue to follow creatinine. Improved 1.2 from 1.5 from 1.9  LVEDP 17 at cath.  No evidence of CHF on exam.   5. AAA- she remembers it being 5cm. Abd aortic ultrasound stable 5cm on 10/09/15. She says been 7 years since prior evaluation. Post hospital follow-up, we will have referral to vascular surgery.  Awaiting CABG Monday.    Tonya Schultz, MD  10/12/2015  12:35 PM

## 2015-10-12 NOTE — Progress Notes (Signed)
CARDIAC REHAB PHASE I   PRE:  Rate/Rhythm: 75 SR  BP:  Supine:   Sitting: 134/68  Standing:    SaO2: 99%RA  MODE:  Ambulation: 375 ft   POST:  Rate/Rhythm: 77  BP:  Supine:   Sitting: 141/68  Standing:    SaO2: 100%RA 1400-1427 Pt walked 375 ft on RA with rollator and gait belt use and asst x 1. Tolerated well. Back to recliner after walk.   Tonya Nuttingharlene Lanisha Stepanian, RN BSN  10/12/2015 2:23 PM

## 2015-10-12 NOTE — Care Management Important Message (Signed)
Important Message  Patient Details  Name: Tonya Payne MRN: [id number] Date of Birth: [birth year]   Medicare Important Message Given:  Yes    Kyla BalzarineShealy, Danuel Felicetti Abena 10/12/2015, 10:32 AM

## 2015-10-13 ENCOUNTER — Inpatient Hospital Stay (HOSPITAL_COMMUNITY): Payer: Medicare Other

## 2015-10-13 DIAGNOSIS — I25110 Atherosclerotic heart disease of native coronary artery with unstable angina pectoris: Secondary | ICD-10-CM

## 2015-10-13 LAB — SPIROMETRY WITH GRAPH
FEF 25-75 Pre: 0.51 L/sec
FEF2575-%Pred-Pre: 40 %
FEV1-%Pred-Pre: 51 %
FEV1-Pre: 0.88 L
FEV1FVC-%Pred-Pre: 82 %
FEV6-%Pred-Pre: 64 %
FEV6-Pre: 1.4 L
FEV6FVC-%Pred-Pre: 105 %
FVC-%Pred-Pre: 62 %
FVC-Pre: 1.43 L
Pre FEV1/FVC ratio: 61 %
Pre FEV6/FVC Ratio: 100 %

## 2015-10-13 LAB — CBC
HCT: 32.9 % — ABNORMAL LOW (ref 36.0–46.0)
Hemoglobin: 10.5 g/dL — ABNORMAL LOW (ref 12.0–15.0)
MCH: 30.8 pg (ref 26.0–34.0)
MCHC: 31.9 g/dL (ref 30.0–36.0)
MCV: 96.5 fL (ref 78.0–100.0)
PLATELETS: 268 10*3/uL (ref 150–400)
RBC: 3.41 MIL/uL — ABNORMAL LOW (ref 3.87–5.11)
RDW: 14 % (ref 11.5–15.5)
WBC: 5.4 10*3/uL (ref 4.0–10.5)

## 2015-10-13 LAB — HEPARIN LEVEL (UNFRACTIONATED): HEPARIN UNFRACTIONATED: 0.67 [IU]/mL (ref 0.30–0.70)

## 2015-10-13 MED ORDER — ALPRAZOLAM 0.25 MG PO TABS
0.2500 mg | ORAL_TABLET | ORAL | Status: DC | PRN
  Administered 2015-10-13 – 2015-10-15 (×2): 0.25 mg via ORAL
  Filled 2015-10-13 (×2): qty 1

## 2015-10-13 MED ORDER — ALBUTEROL SULFATE (2.5 MG/3ML) 0.083% IN NEBU
2.5000 mg | INHALATION_SOLUTION | Freq: Once | RESPIRATORY_TRACT | Status: AC
  Administered 2015-10-13: 2.5 mg via RESPIRATORY_TRACT

## 2015-10-13 NOTE — Progress Notes (Signed)
ANTICOAGULATION CONSULT NOTE  Pharmacy Consult for Heparin  Indication: multivessel CAD  No Known Allergies  Patient Measurements: Height: 4' 11.84" (152 cm) Weight: 106 lb 14.4 oz (48.49 kg) IBW/kg (Calculated) : 45.14 Weight: 49kg  Vital Signs: Temp: 98.4 F (36.9 C) (06/09 0407) Temp Source: Oral (06/09 0407) BP: 134/55 mmHg (06/09 1008) Pulse Rate: 70 (06/09 1008)  Labs:  Recent Labs  10/11/15 0301 10/12/15 0300 10/13/15 0355  HGB 10.7* 10.7* 10.5*  HCT 33.4* 33.2* 32.9*  PLT 271 266 268  HEPARINUNFRC 0.42 0.46 0.67  CREATININE  --  1.17*  --     Estimated Creatinine Clearance: 27.8 mL/min (by C-G formula based on Cr of 1.17).  Assessment: 81yof presented as a Code STEMI and taken directly to cath lab where she was found to have 95% stenosed mid LAD, 75% stenosed post-proximal LAD, and 75% stenosed left main. PTCA was unsuccessful due to heavy calcification of the lesions. No AC pta. Stopped Brilinta 6/5 in anticipation of CABG Monday.  Heparin level remains therapeutic 0.67 on 800 units/h. Hg low stable, plt wnl. No bleed documented.  Goal of Therapy:  Heparin level 0.3-0.7 units/ml Monitor platelets by anticoagulation protocol: Yes   Plan:   - Continue Heparin at 800 units/hr  - Daily HL/CBC - Monitor s/sx bleeding - Plan for CABG 6/12  [facility]S Regional HospitalJennifer [place], VermontPharm.D., BCPS Clinical Pharmacist Pager: [phone] 10/13/2015 10:44 AM

## 2015-10-13 NOTE — Progress Notes (Signed)
SUBJECTIVE:  No CP, no SOB.   OBJECTIVE:   Vitals:   Filed Vitals:   10/12/15 1500 10/12/15 1947 10/13/15 0407 10/13/15 1008  BP: 125/72 149/73 139/77 134/55  Pulse: 72 80 75 70  Temp: 98.3 F (36.8 C) 98.5 F (36.9 C) 98.4 F (36.9 C)   TempSrc: Oral Oral Oral   Resp: 18 16 18    Height:      Weight:   106 lb 14.4 oz (48.49 kg)   SpO2: 100% 98% 99%    I&O's:   No intake or output data in the 24 hours ending 10/13/15 1043 TELEMETRY: Personally Reviewed telemetry pt in NSR     PHYSICAL EXAM General: Well developed, well nourished, in no acute distress Head: Eyes PERRLA, No xanthomas.   Normal cephalic and atramatic  Lungs:   Clear bilaterally to auscultation and percussion. Heart:   HRRR S1 S2, +S4 Pulses are 2+ & equal. Abdomen: Bowel sounds are positive, abdomen soft and non-tender without masses Extremities:   No clubbing, cyanosis or edema.  DP +1 Neuro: Alert and oriented X 3. Psych:  Good affect, responds appropriately   LABS: Basic Metabolic Panel:  Recent Labs  16/02/9605/08/17 0300  NA 134*  K 3.7  CL 108  CO2 19*  GLUCOSE 100*  BUN 9  CREATININE 1.17*  CALCIUM 9.1     Recent Labs  10/12/15 0300 10/13/15 0355  WBC 5.3 5.4  HGB 10.7* 10.5*  HCT 33.2* 32.9*  MCV 94.3 96.5  PLT 266 268    RADIOLOGY: [place] Aorta  10/09/2015  CLINICAL DATA:  Aortic aneurysm, follow-up. Recently scratch the previous tobacco abuse. EXAM: ULTRASOUND OF ABDOMINAL AORTA TECHNIQUE: Ultrasound examination of the abdominal aorta was performed to evaluate for abdominal aortic aneurysm. COMPARISON:  CT 08/17/2014 and previous FINDINGS: Abdominal Aorta Distal fusiform dilatation to maximum AP diameter of 5 cm (stable since prior CT) extending over length of at least 8 cm. Right common iliac artery measures 11 mm diameter, left 9 mm. IMPRESSION: 1. Stable 5 cm distal abdominal aortic fusiform aneurysm. Recommend followup by abdomen and pelvis CTA in 3-6 months, and vascular surgery  referral/consultation if not already obtained. This recommendation follows ACR consensus guidelines: White Paper of the ACR Incidental Findings Committee II on Vascular Findings. J Am Coll Radiol 2013; 10:789-794. Electronically Signed   By: [name]  Hassell M.D.   On: 10/09/2015 16:18   Dg Chest Port 1 View  10/06/2015  CLINICAL DATA:  Post cardiac catheterization EXAM: PORTABLE CHEST 1 VIEW COMPARISON:  08/17/2014 FINDINGS: Mild hyperinflation of the lungs. Heart is borderline enlarged. Biapical pleural thickening. Scarring in the lung bases. No acute airspace opacities or effusions. No acute bony abnormality. No pneumothorax. IMPRESSION: COPD/chronic changes.  Borderline heart size. Electronically Signed   By: Charlett NoseKevin  Dover M.D.   On: 10/06/2015 19:24   Scheduled Meds: . albuterol  2.5 mg Nebulization Once  . aspirin  81 mg Oral Daily  . atorvastatin  80 mg Oral q1800  . metoprolol tartrate  12.5 mg Oral BID  . OLANZapine  5 mg Oral QHS  . sertraline  50 mg Oral Daily   Continuous Infusions: . sodium chloride    . sodium chloride 10 mL/hr at 10/11/15 0000  . heparin 800 Units/hr (10/12/15 0711)  . nitroGLYCERIN Stopped (10/07/15 0930)   PRN Meds:.acetaminophen, ALPRAZolam, nitroGLYCERIN, ondansetron (ZOFRAN) IV, zolpidem  ASSESSMENT AND PLAN-CABG pending  1. Acute STEMI, s/pPTCA only to 95% mid LAD with 70% residual stenosis- note  Dr. Erich Montane report of heavily Calcified coronary arteries with 75% LM lesion and 75% ost-prox LAD. She is pain free and troponin only mildly elevated (peak 3.27).  Continue ASA/statin/BB/IV NTG and Heparin gtts. Holding Brilinta prior to CABG (last dose 10/09/15).  Dr. [name].  CABG Monday.   2.  Severe ischemic DCM with EF 20-25% on cath improved to 40% on Echo (likely stunning as well).  Continue low dose metoprolol and consider changing to Coreg once BP stable.  No ACE I due to elevated creatinine and soft BP.  3.  Dyslipidemia -  high dose statin  therapy.  4.  Acute kidney injury secondary to acute MI and contrast dye exposure.  Continue to follow creatinine. Improved 1.2 from 1.5 from 1.9  LVEDP 17 at cath.  No evidence of CHF on exam.   5. AAA- she remembers it being 5cm. Abd aortic ultrasound stable 5cm on 10/09/15. She says been 7 years since prior evaluation. Post hospital follow-up, we will have referral to vascular surgery.  Awaiting CABG Monday.    Donato Schultz, MD  10/13/2015  10:43 AM

## 2015-10-13 NOTE — Progress Notes (Signed)
CARDIAC REHAB PHASE I   PRE:  Rate/Rhythm: 81 SR  BP:  Supine: 135/61  Sitting:  Standing:    SaO2: 99%RA  MODE:  Ambulation: 425 ft   POST:  Rate/Rhythm: 80  BP:  Supine:   Sitting: 136/67  Standing:    SaO2: 100%RA 1318-1400 Pt brushed teeth at sink and then walked 425 ft on RA with gait belt use and rollator. Sat once to rest due to leg weakness. To recliner after walk with call bell. Pt stated she will watch pre op video with son this weekend. Gave pt IS as she did not have one yet. Pt able to get 750 ml on IS and do correctly.   Luetta Nuttingharlene Raveen Wieseler, RN BSN  10/13/2015 1:57 PM

## 2015-10-13 NOTE — Progress Notes (Signed)
7 Days Post-Op Procedure(s) (LRB): Left Heart Cath and Coronary Angiography (N/A) Coronary Balloon Angioplasty (N/A) Subjective: Just back from a walk. No CP but did get SOB  Objective: Vital signs in last 24 hours: Temp:  [98.3 F (36.8 C)-98.5 F (36.9 C)] 98.4 F (36.9 C) (06/09 0407) Pulse Rate:  [70-80] 70 (06/09 1008) Cardiac Rhythm:  [-] Normal sinus rhythm (06/09 0700) Resp:  [16-18] 18 (06/09 0407) BP: (125-149)/(55-77) 134/55 mmHg (06/09 1008) SpO2:  [98 %-100 %] 99 % (06/09 0407) Weight:  [106 lb 14.4 oz (48.49 kg)] 106 lb 14.4 oz (48.49 kg) (06/09 0407)  Hemodynamic parameters for last 24 hours:    Intake/Output from previous day: 06/08 0701 - 06/09 0700 In: 240 [P.O.:240] Out: -  Intake/Output this shift: Total I/O In: 240 [P.O.:240] Out: -   General appearance: alert, cooperative and no distress Neurologic: intact Heart: regular rate and rhythm Lungs: diminished breath sounds bilaterally  Lab Results:  Recent Labs  10/12/15 0300 10/13/15 0355  WBC 5.3 5.4  HGB 10.7* 10.5*  HCT 33.2* 32.9*  PLT 266 268   BMET:  Recent Labs  10/12/15 0300  NA 134*  K 3.7  CL 108  CO2 19*  GLUCOSE 100*  BUN 9  CREATININE 1.17*  CALCIUM 9.1    PT/INR: No results for input(s): LABPROT, INR in the last 72 hours. ABG    Component Value Date/Time   TCO2 22 10/06/2015 1501   CBG (last 3)  No results for input(s): GLUCAP in the last 72 hours.  Assessment/Plan: S/P Procedure(s) (LRB): Left Heart Cath and Coronary Angiography (N/A) Coronary Balloon Angioplasty (N/A) -  For CABG on Monday 10/16/2015 No Cp since cath She has significant COPD with an FEV1 of 0.8(51% of predicted) She is aware of risks and benefits and agrees to proceed   LOS: 7 days    Loreli SlotSteven C Toshia Larkin 10/13/2015

## 2015-10-14 ENCOUNTER — Inpatient Hospital Stay (HOSPITAL_COMMUNITY): Payer: Medicare Other

## 2015-10-14 LAB — BASIC METABOLIC PANEL
Anion gap: 8 (ref 5–15)
BUN: 12 mg/dL (ref 6–20)
CO2: 20 mmol/L — ABNORMAL LOW (ref 22–32)
CREATININE: 1.16 mg/dL — AB (ref 0.44–1.00)
Calcium: 9.3 mg/dL (ref 8.9–10.3)
Chloride: 109 mmol/L (ref 101–111)
GFR calc Af Amer: 51 mL/min — ABNORMAL LOW (ref >60)
GFR, EST NON AFRICAN AMERICAN: 44 mL/min — AB (ref >60)
Glucose, Bld: 101 mg/dL — ABNORMAL HIGH (ref 65–99)
Potassium: 4.1 mmol/L (ref 3.5–5.1)
SODIUM: 137 mmol/L (ref 135–145)

## 2015-10-14 LAB — CBC
HCT: 32.3 % — ABNORMAL LOW (ref 36.0–46.0)
Hemoglobin: 10.1 g/dL — ABNORMAL LOW (ref 12.0–15.0)
MCH: 29.8 pg (ref 26.0–34.0)
MCHC: 31.3 g/dL (ref 30.0–36.0)
MCV: 95.3 fL (ref 78.0–100.0)
Platelets: 271 10*3/uL (ref 150–400)
RBC: 3.39 MIL/uL — ABNORMAL LOW (ref 3.87–5.11)
RDW: 13.9 % (ref 11.5–15.5)
WBC: 5.5 10*3/uL (ref 4.0–10.5)

## 2015-10-14 LAB — HEPARIN LEVEL (UNFRACTIONATED): HEPARIN UNFRACTIONATED: 0.64 [IU]/mL (ref 0.30–0.70)

## 2015-10-14 NOTE — Progress Notes (Signed)
ANTICOAGULATION CONSULT NOTE  Pharmacy Consult for Heparin  Indication: multivessel CAD  No Known Allergies  Patient Measurements: Height: 4' 11.84" (152 cm) Weight: 106 lb 6.4 oz (48.263 kg) IBW/kg (Calculated) : 45.14 Weight: 49kg  Vital Signs: Temp: 98.1 F (36.7 C) (06/10 0550) Temp Source: Oral (06/10 0550) BP: 132/73 mmHg (06/10 0550) Pulse Rate: 79 (06/10 0550)  Labs:  Recent Labs  10/12/15 0300 10/13/15 0355 10/14/15 0402  HGB 10.7* 10.5* 10.1*  HCT 33.2* 32.9* 32.3*  PLT 266 268 271  HEPARINUNFRC 0.46 0.67 0.64  CREATININE 1.17*  --   --     Estimated Creatinine Clearance: 27.8 mL/min (by C-G formula based on Cr of 1.17).  Assessment: 81yof presented as a Code STEMI and taken directly to cath lab where she was found to have 95% stenosed mid LAD, 75% stenosed post-proximal LAD, and 75% stenosed left main. PTCA was unsuccessful due to heavy calcification of the lesions. No AC pta. Stopped Brilinta 6/5 in anticipation of CABG Monday.  Heparin level remains therapeutic 0.64 on 800 units/h. Hg low stable, plt wnl. No bleed documented.  Goal of Therapy:  Heparin level 0.3-0.7 units/ml Monitor platelets by anticoagulation protocol: Yes   Plan:   - Continue Heparin at 800 units/hr  - Daily HL/CBC - Monitor s/sx bleeding - Plan for CABG 6/12 - stop heparin at 0500 on 6/12  Babs BertinHaley Anina Schnake, PharmD, [facility] Clinical Pharmacist Pager [phone] 10/14/2015 11:06 AM

## 2015-10-14 NOTE — Progress Notes (Signed)
SUBJECTIVE:  No complaints  OBJECTIVE:   Vitals:   Filed Vitals:   10/13/15 1440 10/13/15 2109 10/13/15 2114 10/14/15 0550  BP: 120/67 139/66 123/59 132/73  Pulse: 71 72 87 79  Temp: 98.4 F (36.9 C) 97.6 F (36.4 C) 98.3 F (36.8 C) 98.1 F (36.7 C)  TempSrc: Oral Oral Oral Oral  Resp: 18 18 18 18   Height:      Weight:    106 lb 6.4 oz (48.263 kg)  SpO2: 97% 97% 100% 97%   I&O's:   Intake/Output Summary (Last 24 hours) at 10/14/15 1045 Last data filed at 10/13/15 1300  Gross per 24 hour  Intake    240 ml  Output      0 ml  Net    240 ml   TELEMETRY: Reviewed telemetry pt in NSR:     PHYSICAL EXAM General: Well developed, well nourished, in no acute distress Head: Eyes PERRLA, No xanthomas.   Normal cephalic and atramatic  Lungs:   Clear bilaterally to auscultation and percussion. Heart:   HRRR S1 S2 Pulses are 2+ & equal. Abdomen: Bowel sounds are positive, abdomen soft and non-tender without masses Msk:  Back normal, normal gait. Normal strength and tone for age. Extremities:   No clubbing, cyanosis or edema.  DP +1 Neuro: Alert and oriented X 3. Psych:  Good affect, responds appropriately   LABS: Basic Metabolic Panel:  Recent Labs  16/02/9605/08/17 0300  NA 134*  K 3.7  CL 108  CO2 19*  GLUCOSE 100*  BUN 9  CREATININE 1.17*  CALCIUM 9.1   Liver Function Tests: No results for input(s): AST, ALT, ALKPHOS, BILITOT, PROT, ALBUMIN in the last 72 hours. No results for input(s): LIPASE, AMYLASE in the last 72 hours. CBC:  Recent Labs  10/13/15 0355 10/14/15 0402  WBC 5.4 5.5  HGB 10.5* 10.1*  HCT 32.9* 32.3*  MCV 96.5 95.3  PLT 268 271   Cardiac Enzymes: No results for input(s): CKTOTAL, CKMB, CKMBINDEX, TROPONINI in the last 72 hours. BNP: Invalid input(s): POCBNP D-Dimer: No results for input(s): DDIMER in the last 72 hours. Hemoglobin A1C: No results for input(s): HGBA1C in the last 72 hours. Fasting Lipid Panel: No results for input(s):  CHOL, HDL, LDLCALC, TRIG, CHOLHDL, LDLDIRECT in the last 72 hours. Thyroid Function Tests: No results for input(s): TSH, T4TOTAL, T3FREE, THYROIDAB in the last 72 hours.  Invalid input(s): FREET3 Anemia Panel: No results for input(s): VITAMINB12, FOLATE, FERRITIN, TIBC, IRON, RETICCTPCT in the last 72 hours. Coag Panel:   Lab Results  Component Value Date   INR 1.02 10/06/2015   INR 0.97 02/25/2015    RADIOLOGY: Dg Chest 2 View  10/14/2015  CLINICAL DATA:  81 year old female with a history of left-sided chest pain and shortness of breath EXAM: CHEST  2 VIEW COMPARISON:  10/06/2015, 08/17/2014 FINDINGS: Cardiomediastinal silhouette borderline enlarged. Stigmata of emphysema, with increased retrosternal airspace, flattened hemidiaphragms, increased AP diameter, and hyperinflation on the AP view. Architectural distortion at the lung bases subpleural location on the lateral view. This is more pronounced than on the comparison of 08/17/2014, relatively unchanged from recent comparison. Interstitial opacity superior to the minor fissure on the right is unchanged from 2016 and the most recent comparison. No pneumothorax. No pleural effusion. Atherosclerosis. Surgical changes of the cervical region. IMPRESSION: Chronic changes of advanced emphysema and architectural distortion, most pronounced at the bilateral lung bases. Difficult to exclude a superimposed infection. Signed, Yvone NeuJaime S. Loreta AveWagner, DO [facility] Electronically Signed   By: Gilmer Mor D.O.   On: 10/14/2015 09:46   US Aorta  10/09/2015  CLINICAL DATA:  Aortic aneurysm, follow-up. Recently scratch the previous tobacco abuse. EXAM: ULTRASOUND OF ABDOMINAL AORTA TECHNIQUE: Ultrasound examination of the abdominal aorta was performed to evaluate for abdominal aortic aneurysm. COMPARISON:  CT 08/17/2014 and previous FINDINGS: Abdominal Aorta Distal fusiform dilatation to maximum AP diameter  of 5 cm (stable since prior CT) extending over length of at least 8 cm. Right common iliac artery measures 11 mm diameter, left 9 mm. IMPRESSION: 1. Stable 5 cm distal abdominal aortic fusiform aneurysm. Recommend followup by abdomen and pelvis CTA in 3-6 months, and vascular surgery referral/consultation if not already obtained. This recommendation follows ACR consensus guidelines: White Paper of the ACR Incidental Findings Committee II on Vascular Findings. J Am Coll Radiol 2013; 10:789-794. Electronically Signed   By: [name] M.D.   On: 10/09/2015 16:18   Dg Chest Port 1 View  10/06/2015  CLINICAL DATA:  Post cardiac catheterization EXAM: PORTABLE CHEST 1 VIEW COMPARISON:  08/17/2014 FINDINGS: Mild hyperinflation of the lungs. Heart is borderline enlarged. Biapical pleural thickening. Scarring in the lung bases. No acute airspace opacities or effusions. No acute bony abnormality. No pneumothorax. IMPRESSION: COPD/chronic changes.  Borderline heart size. Electronically Signed   By: Charlett Nose M.D.   On: 10/06/2015 19:24    ASSESSMENT AND PLAN:  1. Acute STEMI, s/pPTCA only to 95% mid LAD with 70% residual stenosis- note Dr. Erich Montane report of heavily Calcified coronary arteries with 75% LM lesion and 75% ost-prox LAD. She is pain free and troponin only mildly elevated (peak 3.27). Continue ASA/statin/BB/IV NTG and Heparin gtts. Holding Brilinta prior to CABG (last dose 10/09/15). Dr. [name]. CABG Monday.   2. Severe ischemic DCM with EF 20-25% on cath improved to 40% on Echo (likely stunning as well). Continue low dose metoprolol and consider changing to Coreg post op. No ACE I due to elevated creatinine and soft BP.  3. Dyslipidemia - high dose statin therapy.  4. Acute kidney injury secondary to acute MI and contrast dye exposure. Continue to follow creatinine. Improved 1.4-->1.84-->1.5-->1.17 LVEDP 17 at cath. No evidence of CHF on exam. Check BMET today.  5. AAA-  Abd aortic  ultrasound stable 5cm on 10/09/15.  Post hospital follow-up, we will have referral to vascular surgery.   Armanda Magic, MD  10/14/2015  10:45 AM

## 2015-10-14 NOTE — Progress Notes (Signed)
CARDIAC REHAB PHASE I   PRE:  Rate/Rhythm: SR  90   BP:  Supine:   Sitting: 124/51  Standing:    SaO2: 97 RA  MODE:  Ambulation: 400 ft rolater   POST:  Rate/Rhythm: SR 92  BP:  Supine:   Sitting: 109/52  Standing:    SaO2: 98 RA  Ambulated x 1 with rolater.  Pt tolerated fair, admitted not having a "good" day.  Reviewed getting up and down without using arms.  Pt back to bed with call bell in reach.  No other complaints.  Encouraged pt to ask to ambulate with nursing staff on Sunday.  Will follow up with pt post operatively upon transfer to floor. Alanson Alyarlette Carlton RN, BSN  [phone]

## 2015-10-15 LAB — BASIC METABOLIC PANEL
Anion gap: 9 (ref 5–15)
BUN: 11 mg/dL (ref 6–20)
CALCIUM: 9.2 mg/dL (ref 8.9–10.3)
CO2: 20 mmol/L — AB (ref 22–32)
CREATININE: 1.13 mg/dL — AB (ref 0.44–1.00)
Chloride: 109 mmol/L (ref 101–111)
GFR calc non Af Amer: 45 mL/min — ABNORMAL LOW (ref >60)
GFR, EST AFRICAN AMERICAN: 52 mL/min — AB (ref >60)
Glucose, Bld: 99 mg/dL (ref 65–99)
Potassium: 3.7 mmol/L (ref 3.5–5.1)
SODIUM: 138 mmol/L (ref 135–145)

## 2015-10-15 LAB — PROTIME-INR
INR: 1.07 (ref 0.00–1.49)
Prothrombin Time: 14.1 seconds (ref 11.6–15.2)

## 2015-10-15 LAB — CBC
HCT: 32.4 % — ABNORMAL LOW (ref 36.0–46.0)
Hemoglobin: 10.2 g/dL — ABNORMAL LOW (ref 12.0–15.0)
MCH: 30.6 pg (ref 26.0–34.0)
MCHC: 31.5 g/dL (ref 30.0–36.0)
MCV: 97.3 fL (ref 78.0–100.0)
PLATELETS: 272 10*3/uL (ref 150–400)
RBC: 3.33 MIL/uL — ABNORMAL LOW (ref 3.87–5.11)
RDW: 14 % (ref 11.5–15.5)
WBC: 6.4 10*3/uL (ref 4.0–10.5)

## 2015-10-15 LAB — HEPARIN LEVEL (UNFRACTIONATED)
HEPARIN UNFRACTIONATED: 0.54 [IU]/mL (ref 0.30–0.70)
HEPARIN UNFRACTIONATED: 0.41 [IU]/mL (ref 0.30–0.70)
Heparin Unfractionated: 0.77 IU/mL — ABNORMAL HIGH (ref 0.30–0.70)

## 2015-10-15 MED ORDER — DOPAMINE-DEXTROSE 3.2-5 MG/ML-% IV SOLN
0.0000 ug/kg/min | INTRAVENOUS | Status: AC
  Administered 2015-10-16: 3 ug/kg/min via INTRAVENOUS
  Filled 2015-10-15: qty 250

## 2015-10-15 MED ORDER — SODIUM CHLORIDE 0.9 % IV SOLN
INTRAVENOUS | Status: AC
  Administered 2015-10-16: .9 [IU]/h via INTRAVENOUS
  Filled 2015-10-15: qty 2.5

## 2015-10-15 MED ORDER — POTASSIUM CHLORIDE 2 MEQ/ML IV SOLN
80.0000 meq | INTRAVENOUS | Status: DC
  Filled 2015-10-15: qty 40

## 2015-10-15 MED ORDER — SODIUM CHLORIDE 0.9 % IV SOLN
INTRAVENOUS | Status: DC
  Filled 2015-10-15: qty 30

## 2015-10-15 MED ORDER — MAGNESIUM SULFATE 50 % IJ SOLN
40.0000 meq | INTRAMUSCULAR | Status: DC
  Filled 2015-10-15: qty 10

## 2015-10-15 MED ORDER — PLASMA-LYTE 148 IV SOLN
INTRAVENOUS | Status: AC
  Administered 2015-10-16: 500 mL
  Filled 2015-10-15: qty 2.5

## 2015-10-15 MED ORDER — SODIUM CHLORIDE 0.9 % IV SOLN
INTRAVENOUS | Status: AC
  Administered 2015-10-16: 69.8 mL/h via INTRAVENOUS
  Filled 2015-10-15: qty 40

## 2015-10-15 MED ORDER — PHENYLEPHRINE HCL 10 MG/ML IJ SOLN
30.0000 ug/min | INTRAVENOUS | Status: AC
  Administered 2015-10-16: 5 ug/min via INTRAVENOUS
  Filled 2015-10-15: qty 2

## 2015-10-15 MED ORDER — NITROGLYCERIN IN D5W 200-5 MCG/ML-% IV SOLN
2.0000 ug/min | INTRAVENOUS | Status: AC
  Administered 2015-10-16: 5 ug/min via INTRAVENOUS
  Filled 2015-10-15: qty 250

## 2015-10-15 MED ORDER — BISACODYL 5 MG PO TBEC
5.0000 mg | DELAYED_RELEASE_TABLET | Freq: Once | ORAL | Status: AC
  Administered 2015-10-15: 5 mg via ORAL
  Filled 2015-10-15: qty 1

## 2015-10-15 MED ORDER — METOPROLOL TARTRATE 12.5 MG HALF TABLET
12.5000 mg | ORAL_TABLET | Freq: Once | ORAL | Status: AC
  Administered 2015-10-16: 12.5 mg via ORAL
  Filled 2015-10-15: qty 1

## 2015-10-15 MED ORDER — CHLORHEXIDINE GLUCONATE 4 % EX LIQD
60.0000 mL | Freq: Once | CUTANEOUS | Status: AC
  Administered 2015-10-16: 4 via TOPICAL
  Filled 2015-10-15: qty 60

## 2015-10-15 MED ORDER — CHLORHEXIDINE GLUCONATE 4 % EX LIQD
60.0000 mL | Freq: Once | CUTANEOUS | Status: AC
  Administered 2015-10-15: 4 via TOPICAL
  Filled 2015-10-15 (×2): qty 60

## 2015-10-15 MED ORDER — DEXMEDETOMIDINE HCL IN NACL 400 MCG/100ML IV SOLN
0.1000 ug/kg/h | INTRAVENOUS | Status: AC
  Administered 2015-10-16: .3 ug/kg/h via INTRAVENOUS
  Filled 2015-10-15: qty 100

## 2015-10-15 MED ORDER — CEFUROXIME SODIUM 1.5 G IJ SOLR
1.5000 g | INTRAMUSCULAR | Status: AC
  Administered 2015-10-16: 1.5 g via INTRAVENOUS
  Administered 2015-10-16: .75 g via INTRAVENOUS
  Filled 2015-10-15: qty 1.5

## 2015-10-15 MED ORDER — EPINEPHRINE HCL 1 MG/ML IJ SOLN
0.0000 ug/min | INTRAVENOUS | Status: DC
  Filled 2015-10-15: qty 4

## 2015-10-15 MED ORDER — DEXTROSE 5 % IV SOLN
750.0000 mg | INTRAVENOUS | Status: DC
  Filled 2015-10-15: qty 750

## 2015-10-15 MED ORDER — CHLORHEXIDINE GLUCONATE 0.12 % MT SOLN
15.0000 mL | Freq: Once | OROMUCOSAL | Status: AC
  Administered 2015-10-16: 15 mL via OROMUCOSAL
  Filled 2015-10-15: qty 15

## 2015-10-15 MED ORDER — VANCOMYCIN HCL 10 G IV SOLR
1250.0000 mg | INTRAVENOUS | Status: AC
  Administered 2015-10-16: 1250 mg via INTRAVENOUS
  Filled 2015-10-15: qty 1250

## 2015-10-15 MED ORDER — DIAZEPAM 2 MG PO TABS
2.0000 mg | ORAL_TABLET | Freq: Once | ORAL | Status: AC
  Administered 2015-10-16: 2 mg via ORAL
  Filled 2015-10-15: qty 1

## 2015-10-15 NOTE — Progress Notes (Signed)
ANTICOAGULATION CONSULT NOTE  Pharmacy Consult for Heparin  Indication: multivessel CAD  No Known Allergies  Patient Measurements: Height: 4' 11.84" (152 cm) Weight: 106 lb 6.4 oz (48.263 kg) IBW/kg (Calculated) : 45.14 Weight: 49kg  Vital Signs:    Labs:  Recent Labs  10/13/15 0355 10/14/15 0402 10/14/15 1151 10/15/15 0250 10/15/15 1229 10/15/15 2020  HGB 10.5* 10.1*  --  10.2*  --   --   HCT 32.9* 32.3*  --  32.4*  --   --   PLT 268 271  --  272  --   --   LABPROT  --   --   --   --   --  14.1  INR  --   --   --   --   --  1.07  HEPARINUNFRC 0.67 0.64  --  0.77* 0.54 0.41  CREATININE  --   --  1.16* 1.13*  --   --     Estimated Creatinine Clearance: 28.7 mL/min (by C-G formula based on Cr of 1.13).  Assessment: 81yof presented as a Code STEMI and taken directly to cath lab where she was found to have 95% stenosed mid LAD, 75% stenosed post-proximal LAD, and 75% stenosed left main. PTCA was unsuccessful due to heavy calcification of the lesions. No AC pta. Stopped Brilinta 6/5 in anticipation of CABG Monday.  PM Heparin level therapeutic   Goal of Therapy:  Heparin level 0.3-0.7 units/ml Monitor platelets by anticoagulation protocol: Yes   Plan:   - Continue Heparin at 700 units/hr  - Plan for CABG 6/12 - stop heparin at 0500 on 6/12  Thank you Okey RegalLisa Sherryll Skoczylas, PharmD [phone] 10/15/2015 8:55 PM

## 2015-10-15 NOTE — Progress Notes (Signed)
SUBJECTIVE:  No complaints  OBJECTIVE:   Vitals:   Filed Vitals:   10/14/15 1128 10/14/15 1408 10/14/15 2045 10/15/15 0628  BP: 128/63 120/56 147/71 125/71  Pulse: 67 68 65 75  Temp: 98 F (36.7 C) 97.8 F (36.6 C) 97.5 F (36.4 C) 98.2 F (36.8 C)  TempSrc: Oral Oral Oral Oral  Resp: Height:      Weight:    106 lb 6.4 oz (48.263 kg)  SpO2: 98% 99% 100% 98%   I&O's:   Intake/Output Summary (Last 24 hours) at 10/15/15 0925 Last data filed at 10/14/15 2000  Gross per 24 hour  Intake    630 ml  Output    325 ml  Net    305 ml   TELEMETRY: Reviewed telemetry pt in NSR:     PHYSICAL EXAM General: Well developed, well nourished, in no acute distress Head: Eyes PERRLA, No xanthomas.   Normal cephalic and atramatic  Lungs:   Clear bilaterally to auscultation and percussion. Heart:   HRRR S1 S2 Pulses are 2+ & equal.            No carotid bruit. No JVD.  No abdominal bruits. No femoral bruits. Abdomen: Bowel sounds are positive, abdomen soft and non-tender without masses or                  Hernia's noted. Msk:  Back normal, normal gait. Normal strength and tone for age. Extremities:   No clubbing, cyanosis or edema.  DP +1 Neuro: Alert and oriented X 3. Psych:  Good affect, responds appropriately   LABS: Basic Metabolic Panel:  Recent Labs  44/01/02 1151 10/15/15 0250  NA 137 138  K 4.1 3.7  CL 109 109  CO2 20* 20*  GLUCOSE 101* 99  BUN 12 11  CREATININE 1.16* 1.13*  CALCIUM 9.3 9.2   Liver Function Tests: No results for input(s): AST, ALT, ALKPHOS, BILITOT, PROT, ALBUMIN in the last 72 hours. No results for input(s): LIPASE, AMYLASE in the last 72 hours. CBC:  Recent Labs  10/14/15 0402 10/15/15 0250  WBC 5.5 6.4  HGB 10.1* 10.2*  HCT 32.3* 32.4*  MCV 95.3 97.3  PLT 271 272   Cardiac Enzymes: No results for input(s): CKTOTAL, CKMB, CKMBINDEX, TROPONINI in the last 72 hours. BNP: Invalid input(s): POCBNP D-Dimer: No results  for input(s): DDIMER in the last 72 hours. Hemoglobin A1C: No results for input(s): HGBA1C in the last 72 hours. Fasting Lipid Panel: No results for input(s): CHOL, HDL, LDLCALC, TRIG, CHOLHDL, LDLDIRECT in the last 72 hours. Thyroid Function Tests: No results for input(s): TSH, T4TOTAL, T3FREE, THYROIDAB in the last 72 hours.  Invalid input(s): FREET3 Anemia Panel: No results for input(s): VITAMINB12, FOLATE, FERRITIN, TIBC, IRON, RETICCTPCT in the last 72 hours. Coag Panel:   Lab Results  Component Value Date   INR 1.02 10/06/2015   INR 0.97 02/25/2015    RADIOLOGY: Dg Chest 2 View  10/14/2015  CLINICAL DATA:  81 year old female with a history of left-sided chest pain and shortness of breath EXAM: CHEST  2 VIEW COMPARISON:  10/06/2015, 08/17/2014 FINDINGS: Cardiomediastinal silhouette borderline enlarged. Stigmata of emphysema, with increased retrosternal airspace, flattened hemidiaphragms, increased AP diameter, and hyperinflation on the AP view. Architectural distortion at the lung bases subpleural location on the lateral view. This is more pronounced than on the comparison of 08/17/2014, relatively unchanged from recent comparison. Interstitial opacity superior to the minor fissure on the right  is unchanged from 2016 and the most recent comparison. No pneumothorax. No pleural effusion. Atherosclerosis. Surgical changes of the cervical region. IMPRESSION: Chronic changes of advanced emphysema and architectural distortion, most pronounced at the bilateral lung bases. Difficult to exclude a superimposed infection. Signed, Yvone NeuJaime S. Loreta AveWagner, DO [facility] Electronically Signed   By: Gilmer MorJaime  Wagner D.O.   On: 10/14/2015 09:46   [place] Aorta  10/09/2015  CLINICAL DATA:  Aortic aneurysm, follow-up. Recently scratch the previous tobacco abuse. EXAM: ULTRASOUND OF ABDOMINAL AORTA TECHNIQUE: Ultrasound examination of the abdominal aorta was  performed to evaluate for abdominal aortic aneurysm. COMPARISON:  CT 08/17/2014 and previous FINDINGS: Abdominal Aorta Distal fusiform dilatation to maximum AP diameter of 5 cm (stable since prior CT) extending over length of at least 8 cm. Right common iliac artery measures 11 mm diameter, left 9 mm. IMPRESSION: 1. Stable 5 cm distal abdominal aortic fusiform aneurysm. Recommend followup by abdomen and pelvis CTA in 3-6 months, and vascular surgery referral/consultation if not already obtained. This recommendation follows ACR consensus guidelines: White Paper of the ACR Incidental Findings Committee II on Vascular Findings. J Am Coll Radiol 2013; 10:789-794. Electronically Signed   By: [name]  Hassell M.D.   On: 10/09/2015 16:18   Dg Chest Port 1 View  10/06/2015  CLINICAL DATA:  Post cardiac catheterization EXAM: PORTABLE CHEST 1 VIEW COMPARISON:  08/17/2014 FINDINGS: Mild hyperinflation of the lungs. Heart is borderline enlarged. Biapical pleural thickening. Scarring in the lung bases. No acute airspace opacities or effusions. No acute bony abnormality. No pneumothorax. IMPRESSION: COPD/chronic changes.  Borderline heart size. Electronically Signed   By: Charlett NoseKevin  Dover M.D.   On: 10/06/2015 19:24    ASSESSMENT AND PLAN:  1. Acute STEMI, s/pPTCA only to 95% mid LAD with 70% residual stenosis- note Dr. Erich MontaneHardings report of heavily Calcified coronary arteries with 75% LM lesion and 75% ost-prox LAD. She is pain free and troponin only mildly elevated (peak 3.27). Continue ASA/statin/BB/IV NTG and Heparin gtts. Holding Brilinta prior to CABG (last dose 10/09/15). Dr. Dorris FetchHendrickson. CABG Monday.   2. Severe ischemic DCM with EF 20-25% on cath improved to 40% on Echo (likely stunning as well). Continue low dose metoprolol and consider changing to Coreg post op. No ACE I due to elevated creatinine and soft BP.  3. Dyslipidemia - high dose statin therapy.  4. Acute kidney injury secondary to acute MI and contrast  dye exposure. Continue to follow creatinine. Improved 1.4-->1.84-->1.5-->1.17-->1.13 LVEDP 17 at cath. No evidence of CHF on exam.  5. AAA- Abd aortic ultrasound stable 5cm on 10/09/15. Post hospital follow-up, we will have referral to vascular surgery.    Armanda Magicraci Turner, MD  10/15/2015  9:25 AM

## 2015-10-15 NOTE — Progress Notes (Signed)
ANTICOAGULATION CONSULT NOTE  Pharmacy Consult for Heparin  Indication: multivessel CAD  No Known Allergies  Patient Measurements: Height: 4' 11.84" (152 cm) Weight: 106 lb 6.4 oz (48.263 kg) IBW/kg (Calculated) : 45.14 Weight: 49kg  Vital Signs: Temp: 98.2 F (36.8 C) (06/11 0628) Temp Source: Oral (06/11 0628) BP: 125/71 mmHg (06/11 0628) Pulse Rate: 75 (06/11 0628)  Labs:  Recent Labs  10/13/15 0355 10/14/15 0402 10/14/15 1151 10/15/15 0250  HGB 10.5* 10.1*  --  10.2*  HCT 32.9* 32.3*  --  32.4*  PLT 268 271  --  272  HEPARINUNFRC 0.67 0.64  --  0.77*  CREATININE  --   --  1.16* 1.13*    Estimated Creatinine Clearance: 28.7 mL/min (by C-G formula based on Cr of 1.13).  Assessment: 81yof presented as a Code STEMI and taken directly to cath lab where she was found to have 95% stenosed mid LAD, 75% stenosed post-proximal LAD, and 75% stenosed left main. PTCA was unsuccessful due to heavy calcification of the lesions. No AC pta. Stopped Brilinta 6/5 in anticipation of CABG Monday.  Heparin level therapeutic x1 (0.54) on 700 units/h. Hg low stable, plt wnl. No bleed documented.  Goal of Therapy:  Heparin level 0.3-0.7 units/ml Monitor platelets by anticoagulation protocol: Yes   Plan:   - Continue Heparin at 700 units/hr  - 8h HL to confirm, Daily HL/CBC - Monitor s/sx bleeding - Plan for CABG 6/12 - stop heparin at 0500 on 6/12  Babs BertinHaley Cynthia Stainback, PharmD, [facility] Clinical Pharmacist Pager [phone] 10/15/2015 1:51 PM

## 2015-10-15 NOTE — Progress Notes (Signed)
CT surgery  Patient examined and preoperative studies reviewed which are satisfactory. Patient is ready for multivessel CABG in a.m.

## 2015-10-15 NOTE — Progress Notes (Signed)
ANTICOAGULATION CONSULT NOTE - Follow Up Consult  Pharmacy Consult for Heparin  Indication: Multi-vessel CAD  No Known Allergies  Patient Measurements: Height: 4' 11.84" (152 cm) Weight: 106 lb 6.4 oz (48.263 kg) IBW/kg (Calculated) : 45.14  Vital Signs: Temp: 97.5 F (36.4 C) (06/10 2045) Temp Source: Oral (06/10 2045) BP: 147/71 mmHg (06/10 2045) Pulse Rate: 65 (06/10 2045)  Labs:  Recent Labs  10/13/15 0355 10/14/15 0402 10/14/15 1151 10/15/15 0250  HGB 10.5* 10.1*  --  10.2*  HCT 32.9* 32.3*  --  32.4*  PLT 268 271  --  272  HEPARINUNFRC 0.67 0.64  --  0.77*  CREATININE  --   --  1.16* 1.13*    Estimated Creatinine Clearance: 28.7 mL/min (by C-G formula based on Cr of 1.13).  Assessment: Heparin while awaiting CABG 6/12, HL is high this AM, no issues per RN.   Goal of Therapy:  Heparin level 0.3-0.7 units/ml Monitor platelets by anticoagulation protocol: Yes   Plan:  -Dec heparin to 700 units/hr -1300 HL  Abran DukeLedford, Finnick Orosz 10/15/2015,4:19 AM

## 2015-10-16 ENCOUNTER — Encounter (HOSPITAL_COMMUNITY)
Admission: EM | Disposition: A | Discharge status: Skilled Nursing Facility | Payer: Self-pay | Source: Home / Self Care | Attending: Thoracic Surgery (Cardiothoracic Vascular Surgery)

## 2015-10-16 ENCOUNTER — Inpatient Hospital Stay (HOSPITAL_COMMUNITY): Payer: Medicare Other | Admitting: Anesthesiology

## 2015-10-16 ENCOUNTER — Inpatient Hospital Stay (HOSPITAL_COMMUNITY): Payer: Medicare Other

## 2015-10-16 DIAGNOSIS — Z951 Presence of aortocoronary bypass graft: Secondary | ICD-10-CM

## 2015-10-16 HISTORY — PX: TEE WITHOUT CARDIOVERSION: SHX5443

## 2015-10-16 HISTORY — PX: CORONARY ARTERY BYPASS GRAFT: SHX141

## 2015-10-16 LAB — CBC
HCT: 31.3 % — ABNORMAL LOW (ref 36.0–46.0)
HEMATOCRIT: 31 % — AB (ref 36.0–46.0)
HEMATOCRIT: 31.6 % — AB (ref 36.0–46.0)
HEMATOCRIT: 29.2 % — AB (ref 36.0–46.0)
HEMOGLOBIN: 10 g/dL — AB (ref 12.0–15.0)
HEMOGLOBIN: 10.3 g/dL — AB (ref 12.0–15.0)
HEMOGLOBIN: 9.5 g/dL — AB (ref 12.0–15.0)
Hemoglobin: 9.9 g/dL — ABNORMAL LOW (ref 12.0–15.0)
MCH: 30.5 pg (ref 26.0–34.0)
MCH: 30.8 pg (ref 26.0–34.0)
MCH: 29.2 pg (ref 26.0–34.0)
MCH: 29.8 pg (ref 26.0–34.0)
MCHC: 31.9 g/dL (ref 30.0–36.0)
MCHC: 31.9 g/dL (ref 30.0–36.0)
MCHC: 32.6 g/dL (ref 30.0–36.0)
MCHC: 32.5 g/dL (ref 30.0–36.0)
MCV: 95.4 fL (ref 78.0–100.0)
MCV: 96.6 fL (ref 78.0–100.0)
MCV: 89.5 fL (ref 78.0–100.0)
MCV: 91.5 fL (ref 78.0–100.0)
PLATELETS: 270 10*3/uL (ref 150–400)
Platelets: 270 10*3/uL (ref 150–400)
Platelets: 124 10*3/uL — ABNORMAL LOW (ref 150–400)
Platelets: 126 10*3/uL — ABNORMAL LOW (ref 150–400)
RBC: 3.21 MIL/uL — ABNORMAL LOW (ref 3.87–5.11)
RBC: 3.28 MIL/uL — ABNORMAL LOW (ref 3.87–5.11)
RBC: 3.53 MIL/uL — ABNORMAL LOW (ref 3.87–5.11)
RBC: 3.19 MIL/uL — AB (ref 3.87–5.11)
RDW: 13.8 % (ref 11.5–15.5)
RDW: 13.9 % (ref 11.5–15.5)
RDW: 15.7 % — AB (ref 11.5–15.5)
RDW: 16.5 % — ABNORMAL HIGH (ref 11.5–15.5)
WBC: 7.2 10*3/uL (ref 4.0–10.5)
WBC: 7.6 10*3/uL (ref 4.0–10.5)
WBC: 3.9 10*3/uL — ABNORMAL LOW (ref 4.0–10.5)
WBC: 12.2 10*3/uL — AB (ref 4.0–10.5)

## 2015-10-16 LAB — POCT I-STAT 3, ART BLOOD GAS (G3+)
ACID-BASE DEFICIT: 1 mmol/L (ref 0.0–2.0)
Acid-base deficit: 1 mmol/L (ref 0.0–2.0)
Acid-base deficit: 2 mmol/L (ref 0.0–2.0)
Acid-base deficit: 7 mmol/L — ABNORMAL HIGH (ref 0.0–2.0)
Acid-base deficit: 5 mmol/L — ABNORMAL HIGH (ref 0.0–2.0)
BICARBONATE: 22.8 meq/L (ref 20.0–24.0)
BICARBONATE: 24.5 meq/L — AB (ref 20.0–24.0)
Bicarbonate: 23.8 mEq/L (ref 20.0–24.0)
Bicarbonate: 19.6 mEq/L — ABNORMAL LOW (ref 20.0–24.0)
Bicarbonate: 21 mEq/L (ref 20.0–24.0)
O2 SAT: 100 %
O2 SAT: 100 %
O2 SAT: 99 %
O2 SAT: 97 %
O2 Saturation: 100 %
PCO2 ART: 32.9 mmHg — AB (ref 35.0–45.0)
PCO2 ART: 42.9 mmHg (ref 35.0–45.0)
PCO2 ART: 41.8 mmHg (ref 35.0–45.0)
PCO2 ART: 40.6 mmHg (ref 35.0–45.0)
PH ART: 7.344 — AB (ref 7.350–7.450)
PH ART: 7.323 — AB (ref 7.350–7.450)
PO2 ART: 411 mmHg — AB (ref 80.0–100.0)
PO2 ART: 548 mmHg — AB (ref 80.0–100.0)
PO2 ART: 152 mmHg — AB (ref 80.0–100.0)
Patient temperature: 37.1
Patient temperature: 37.3
TCO2: 24 mmol/L (ref 0–100)
TCO2: 26 mmol/L (ref 0–100)
TCO2: 25 mmol/L (ref 0–100)
TCO2: 21 mmol/L (ref 0–100)
TCO2: 22 mmol/L (ref 0–100)
pCO2 arterial: 41.1 mmHg (ref 35.0–45.0)
pH, Arterial: 7.45 (ref 7.350–7.450)
pH, Arterial: 7.383 (ref 7.350–7.450)
pH, Arterial: 7.279 — ABNORMAL LOW (ref 7.350–7.450)
pO2, Arterial: 505 mmHg — ABNORMAL HIGH (ref 80.0–100.0)
pO2, Arterial: 102 mmHg — ABNORMAL HIGH (ref 80.0–100.0)

## 2015-10-16 LAB — GLUCOSE, CAPILLARY
GLUCOSE-CAPILLARY: 82 mg/dL (ref 65–99)
GLUCOSE-CAPILLARY: 90 mg/dL (ref 65–99)
GLUCOSE-CAPILLARY: 145 mg/dL — AB (ref 65–99)
Glucose-Capillary: 139 mg/dL — ABNORMAL HIGH (ref 65–99)

## 2015-10-16 LAB — POCT I-STAT, CHEM 8
BUN: 9 mg/dL (ref 6–20)
BUN: 7 mg/dL (ref 6–20)
BUN: 6 mg/dL (ref 6–20)
BUN: 5 mg/dL — AB (ref 6–20)
BUN: 6 mg/dL (ref 6–20)
BUN: 8 mg/dL (ref 6–20)
CALCIUM ION: 1.03 mmol/L — AB (ref 1.13–1.30)
CALCIUM ION: 1.14 mmol/L (ref 1.13–1.30)
CHLORIDE: 106 mmol/L (ref 101–111)
CHLORIDE: 106 mmol/L (ref 101–111)
CHLORIDE: 101 mmol/L (ref 101–111)
CHLORIDE: 105 mmol/L (ref 101–111)
CHLORIDE: 111 mmol/L (ref 101–111)
CREATININE: 0.5 mg/dL (ref 0.44–1.00)
CREATININE: 0.7 mg/dL (ref 0.44–1.00)
Calcium, Ion: 1.34 mmol/L — ABNORMAL HIGH (ref 1.13–1.30)
Calcium, Ion: 1.29 mmol/L (ref 1.13–1.30)
Calcium, Ion: 0.93 mmol/L — ABNORMAL LOW (ref 1.13–1.30)
Calcium, Ion: 1.05 mmol/L — ABNORMAL LOW (ref 1.13–1.30)
Chloride: 106 mmol/L (ref 101–111)
Creatinine, Ser: 0.8 mg/dL (ref 0.44–1.00)
Creatinine, Ser: 0.7 mg/dL (ref 0.44–1.00)
Creatinine, Ser: 0.5 mg/dL (ref 0.44–1.00)
Creatinine, Ser: 0.8 mg/dL (ref 0.44–1.00)
Glucose, Bld: 103 mg/dL — ABNORMAL HIGH (ref 65–99)
Glucose, Bld: 104 mg/dL — ABNORMAL HIGH (ref 65–99)
Glucose, Bld: 128 mg/dL — ABNORMAL HIGH (ref 65–99)
Glucose, Bld: 112 mg/dL — ABNORMAL HIGH (ref 65–99)
Glucose, Bld: 168 mg/dL — ABNORMAL HIGH (ref 65–99)
Glucose, Bld: 147 mg/dL — ABNORMAL HIGH (ref 65–99)
HCT: 25 % — ABNORMAL LOW (ref 36.0–46.0)
HCT: 28 % — ABNORMAL LOW (ref 36.0–46.0)
HEMATOCRIT: 30 % — AB (ref 36.0–46.0)
HEMATOCRIT: 25 % — AB (ref 36.0–46.0)
HEMATOCRIT: 25 % — AB (ref 36.0–46.0)
HEMATOCRIT: 25 % — AB (ref 36.0–46.0)
HEMOGLOBIN: 10.2 g/dL — AB (ref 12.0–15.0)
Hemoglobin: 8.5 g/dL — ABNORMAL LOW (ref 12.0–15.0)
Hemoglobin: 8.5 g/dL — ABNORMAL LOW (ref 12.0–15.0)
Hemoglobin: 8.5 g/dL — ABNORMAL LOW (ref 12.0–15.0)
Hemoglobin: 8.5 g/dL — ABNORMAL LOW (ref 12.0–15.0)
Hemoglobin: 9.5 g/dL — ABNORMAL LOW (ref 12.0–15.0)
POTASSIUM: 3.7 mmol/L (ref 3.5–5.1)
POTASSIUM: 3.6 mmol/L (ref 3.5–5.1)
POTASSIUM: 4.1 mmol/L (ref 3.5–5.1)
POTASSIUM: 4.3 mmol/L (ref 3.5–5.1)
Potassium: 4.3 mmol/L (ref 3.5–5.1)
Potassium: 5.4 mmol/L — ABNORMAL HIGH (ref 3.5–5.1)
SODIUM: 137 mmol/L (ref 135–145)
SODIUM: 138 mmol/L (ref 135–145)
SODIUM: 137 mmol/L (ref 135–145)
SODIUM: 140 mmol/L (ref 135–145)
Sodium: 139 mmol/L (ref 135–145)
Sodium: 139 mmol/L (ref 135–145)
TCO2: 27 mmol/L (ref 0–100)
TCO2: 26 mmol/L (ref 0–100)
TCO2: 24 mmol/L (ref 0–100)
TCO2: 25 mmol/L (ref 0–100)
TCO2: 26 mmol/L (ref 0–100)
TCO2: 22 mmol/L (ref 0–100)

## 2015-10-16 LAB — BLOOD GAS, ARTERIAL
Acid-base deficit: 3.9 mmol/L — ABNORMAL HIGH (ref 0.0–2.0)
Bicarbonate: 20.1 mEq/L (ref 20.0–24.0)
Drawn by: 245131
FIO2: 21
O2 Saturation: 96.9 %
PATIENT TEMPERATURE: 98.6
PCO2 ART: 33.5 mmHg — AB (ref 35.0–45.0)
PH ART: 7.396 (ref 7.350–7.450)
PO2 ART: 93.1 mmHg (ref 80.0–100.0)
TCO2: 21.1 mmol/L (ref 0–100)

## 2015-10-16 LAB — VAS US DOPPLER PRE CABG
LCCADDIAS: 20 cm/s
LCCADSYS: 83 cm/s
LEFT ECA DIAS: -16 cm/s
LEFT VERTEBRAL DIAS: 17 cm/s
LICADSYS: -93 cm/s
Left CCA prox dias: 14 cm/s
Left CCA prox sys: 72 cm/s
Left ICA dist dias: -29 cm/s
Left ICA prox dias: -29 cm/s
Left ICA prox sys: -87 cm/s
RCCADSYS: -135 cm/s
RCCAPDIAS: 16 cm/s
RIGHT ECA DIAS: -11 cm/s
RIGHT VERTEBRAL DIAS: -19 cm/s
Right CCA prox sys: 64 cm/s

## 2015-10-16 LAB — POCT I-STAT 4, (NA,K, GLUC, HGB,HCT)
GLUCOSE: 83 mg/dL (ref 65–99)
HEMATOCRIT: 32 % — AB (ref 36.0–46.0)
HEMOGLOBIN: 10.9 g/dL — AB (ref 12.0–15.0)
POTASSIUM: 3.3 mmol/L — AB (ref 3.5–5.1)
Sodium: 144 mmol/L (ref 135–145)

## 2015-10-16 LAB — CREATININE, SERUM
Creatinine, Ser: 0.96 mg/dL (ref 0.44–1.00)
GFR calc non Af Amer: 55 mL/min — ABNORMAL LOW (ref >60)

## 2015-10-16 LAB — BASIC METABOLIC PANEL
Anion gap: 8 (ref 5–15)
Anion gap: 9 (ref 5–15)
BUN: 8 mg/dL (ref 6–20)
BUN: 8 mg/dL (ref 6–20)
CALCIUM: 9.2 mg/dL (ref 8.9–10.3)
CO2: 19 mmol/L — ABNORMAL LOW (ref 22–32)
CO2: 19 mmol/L — AB (ref 22–32)
CREATININE: 1.07 mg/dL — AB (ref 0.44–1.00)
Calcium: 9.3 mg/dL (ref 8.9–10.3)
Chloride: 109 mmol/L (ref 101–111)
Chloride: 110 mmol/L (ref 101–111)
Creatinine, Ser: 1.02 mg/dL — ABNORMAL HIGH (ref 0.44–1.00)
GFR calc Af Amer: 59 mL/min — ABNORMAL LOW (ref >60)
GFR calc non Af Amer: 48 mL/min — ABNORMAL LOW (ref >60)
GFR calc non Af Amer: 51 mL/min — ABNORMAL LOW (ref >60)
GFR, EST AFRICAN AMERICAN: 56 mL/min — AB (ref >60)
GLUCOSE: 102 mg/dL — AB (ref 65–99)
Glucose, Bld: 102 mg/dL — ABNORMAL HIGH (ref 65–99)
Potassium: 3.8 mmol/L (ref 3.5–5.1)
Potassium: 3.8 mmol/L (ref 3.5–5.1)
Sodium: 137 mmol/L (ref 135–145)
Sodium: 137 mmol/L (ref 135–145)

## 2015-10-16 LAB — HEMOGLOBIN AND HEMATOCRIT, BLOOD
HEMATOCRIT: 24.6 % — AB (ref 36.0–46.0)
HEMOGLOBIN: 8.1 g/dL — AB (ref 12.0–15.0)

## 2015-10-16 LAB — PLATELET COUNT: Platelets: 164 10*3/uL (ref 150–400)

## 2015-10-16 LAB — PREPARE RBC (CROSSMATCH)

## 2015-10-16 LAB — PROTIME-INR
INR: 1.51 — AB (ref 0.00–1.49)
Prothrombin Time: 18.2 seconds — ABNORMAL HIGH (ref 11.6–15.2)

## 2015-10-16 LAB — MAGNESIUM: Magnesium: 2.7 mg/dL — ABNORMAL HIGH (ref 1.7–2.4)

## 2015-10-16 LAB — HEPARIN LEVEL (UNFRACTIONATED): HEPARIN UNFRACTIONATED: 0.49 [IU]/mL (ref 0.30–0.70)

## 2015-10-16 LAB — APTT: aPTT: 38 seconds — ABNORMAL HIGH (ref 24–37)

## 2015-10-16 SURGERY — CORONARY ARTERY BYPASS GRAFTING (CABG)
Anesthesia: General | Site: Chest

## 2015-10-16 MED ORDER — MORPHINE SULFATE (PF) 2 MG/ML IV SOLN
2.0000 mg | INTRAVENOUS | Status: DC | PRN
  Administered 2015-10-17: 2 mg via INTRAVENOUS
  Filled 2015-10-16: qty 1

## 2015-10-16 MED ORDER — CHLORHEXIDINE GLUCONATE 0.12% ORAL RINSE (MEDLINE KIT): 15.0000 mL | Freq: Two times a day (BID) | OROMUCOSAL | Status: DC

## 2015-10-16 MED ORDER — PANTOPRAZOLE SODIUM 40 MG PO TBEC
40.0000 mg | DELAYED_RELEASE_TABLET | Freq: Every day | ORAL | Status: DC
  Administered 2015-10-17 – 2015-10-30 (×14): 40 mg via ORAL
  Filled 2015-10-16 (×14): qty 1

## 2015-10-16 MED ORDER — SODIUM BICARBONATE 8.4 % IV SOLN
50.0000 meq | Freq: Once | INTRAVENOUS | Status: AC
  Administered 2015-10-16: 50 meq via INTRAVENOUS

## 2015-10-16 MED ORDER — ONDANSETRON HCL 4 MG/2ML IJ SOLN
4.0000 mg | Freq: Four times a day (QID) | INTRAMUSCULAR | Status: DC | PRN
Start: 2015-10-16 — End: 2015-10-30
  Administered 2015-10-17 – 2015-10-21 (×3): 4 mg via INTRAVENOUS
  Filled 2015-10-16 (×4): qty 2

## 2015-10-16 MED ORDER — HEMOSTATIC AGENTS (NO CHARGE) OPTIME
TOPICAL | Status: DC | PRN
  Administered 2015-10-16: 1 via TOPICAL

## 2015-10-16 MED ORDER — SODIUM CHLORIDE 0.9% FLUSH: 3.0000 mL | INTRAVENOUS | Status: DC | PRN

## 2015-10-16 MED ORDER — LACTATED RINGERS IV SOLN
INTRAVENOUS | Status: DC | PRN
  Administered 2015-10-16: 07:00:00 via INTRAVENOUS

## 2015-10-16 MED ORDER — PROTAMINE SULFATE 10 MG/ML IV SOLN
INTRAVENOUS | Status: AC
  Filled 2015-10-16: qty 25

## 2015-10-16 MED ORDER — FENTANYL CITRATE (PF) 100 MCG/2ML IJ SOLN
INTRAMUSCULAR | Status: DC | PRN
  Administered 2015-10-16: 50 ug via INTRAVENOUS
  Administered 2015-10-16 (×4): 250 ug via INTRAVENOUS
  Administered 2015-10-16: 200 ug via INTRAVENOUS

## 2015-10-16 MED ORDER — NITROGLYCERIN 0.2 MG/ML ON CALL CATH LAB
INTRAVENOUS | Status: DC | PRN
  Administered 2015-10-16 (×2): 40 ug via INTRAVENOUS

## 2015-10-16 MED ORDER — SODIUM CHLORIDE 0.9 % IV SOLN
INTRAVENOUS | Status: DC
  Filled 2015-10-16: qty 2.5

## 2015-10-16 MED ORDER — DOCUSATE SODIUM 100 MG PO CAPS
200.0000 mg | ORAL_CAPSULE | Freq: Every day | ORAL | Status: DC
  Administered 2015-10-17 – 2015-10-25 (×7): 200 mg via ORAL
  Filled 2015-10-16 (×8): qty 2

## 2015-10-16 MED ORDER — HEPARIN SODIUM (PORCINE) 1000 UNIT/ML IJ SOLN
INTRAMUSCULAR | Status: DC | PRN
  Administered 2015-10-16: 15000 [IU] via INTRAVENOUS
  Administered 2015-10-16: 2000 [IU] via INTRAVENOUS

## 2015-10-16 MED ORDER — MIDAZOLAM HCL 10 MG/2ML IJ SOLN
INTRAMUSCULAR | Status: AC
  Filled 2015-10-16: qty 2

## 2015-10-16 MED ORDER — LACTATED RINGERS IV SOLN
INTRAVENOUS | Status: DC | PRN
  Administered 2015-10-16 (×2): via INTRAVENOUS

## 2015-10-16 MED ORDER — SODIUM CHLORIDE 0.9% FLUSH
3.0000 mL | Freq: Two times a day (BID) | INTRAVENOUS | Status: DC
  Administered 2015-10-17: 10 mL via INTRAVENOUS
  Administered 2015-10-18 – 2015-10-20 (×3): 3 mL via INTRAVENOUS

## 2015-10-16 MED ORDER — LACTATED RINGERS IV SOLN
INTRAVENOUS | Status: DC
  Administered 2015-10-16: 13:00:00 via INTRAVENOUS

## 2015-10-16 MED ORDER — SODIUM CHLORIDE 0.9 % IJ SOLN
OROMUCOSAL | Status: DC | PRN
  Administered 2015-10-16 (×3): 4 mL via TOPICAL

## 2015-10-16 MED ORDER — NITROGLYCERIN IN D5W 200-5 MCG/ML-% IV SOLN: 0.0000 ug/min | INTRAVENOUS | Status: DC

## 2015-10-16 MED ORDER — MIDAZOLAM HCL 5 MG/5ML IJ SOLN
INTRAMUSCULAR | Status: DC | PRN
  Administered 2015-10-16: 1 mg via INTRAVENOUS
  Administered 2015-10-16: 3 mg via INTRAVENOUS

## 2015-10-16 MED ORDER — SODIUM CHLORIDE 0.45 % IV SOLN: INTRAVENOUS | Status: DC | PRN

## 2015-10-16 MED ORDER — MAGNESIUM SULFATE 4 GM/100ML IV SOLN
4.0000 g | Freq: Once | INTRAVENOUS | Status: AC
  Administered 2015-10-16: 4 g via INTRAVENOUS
  Filled 2015-10-16: qty 100

## 2015-10-16 MED ORDER — CHLORHEXIDINE GLUCONATE 0.12 % MT SOLN
15.0000 mL | OROMUCOSAL | Status: AC
  Administered 2015-10-16: 15 mL via OROMUCOSAL

## 2015-10-16 MED ORDER — METOPROLOL TARTRATE 12.5 MG HALF TABLET
12.5000 mg | ORAL_TABLET | Freq: Two times a day (BID) | ORAL | Status: DC
  Administered 2015-10-17: 12.5 mg via ORAL
  Filled 2015-10-16: qty 1

## 2015-10-16 MED ORDER — PROPOFOL 10 MG/ML IV BOLUS
INTRAVENOUS | Status: DC | PRN
  Administered 2015-10-16: 110 mg via INTRAVENOUS

## 2015-10-16 MED ORDER — METOPROLOL TARTRATE 5 MG/5ML IV SOLN
2.5000 mg | INTRAVENOUS | Status: DC | PRN
  Administered 2015-10-18 – 2015-10-20 (×3): 5 mg via INTRAVENOUS
  Filled 2015-10-16 (×5): qty 5

## 2015-10-16 MED ORDER — DEXMEDETOMIDINE HCL IN NACL 200 MCG/50ML IV SOLN
0.0000 ug/kg/h | INTRAVENOUS | Status: DC
  Filled 2015-10-16: qty 50

## 2015-10-16 MED ORDER — TRAMADOL HCL 50 MG PO TABS
50.0000 mg | ORAL_TABLET | ORAL | Status: DC | PRN
  Administered 2015-10-17 (×3): 50 mg via ORAL
  Administered 2015-10-17: 100 mg via ORAL
  Filled 2015-10-16: qty 2
  Filled 2015-10-16 (×3): qty 1

## 2015-10-16 MED ORDER — ALBUMIN HUMAN 5 % IV SOLN
INTRAVENOUS | Status: DC | PRN
  Administered 2015-10-16: 11:00:00 via INTRAVENOUS

## 2015-10-16 MED ORDER — ACETAMINOPHEN 160 MG/5ML PO SOLN: 650.0000 mg | Freq: Once | ORAL | Status: AC

## 2015-10-16 MED ORDER — BISACODYL 5 MG PO TBEC
10.0000 mg | DELAYED_RELEASE_TABLET | Freq: Every day | ORAL | Status: DC
  Administered 2015-10-17 – 2015-10-25 (×5): 10 mg via ORAL
  Filled 2015-10-16 (×7): qty 2

## 2015-10-16 MED ORDER — OXYCODONE HCL 5 MG PO TABS
5.0000 mg | ORAL_TABLET | ORAL | Status: DC | PRN
  Administered 2015-10-17 (×2): 5 mg via ORAL
  Filled 2015-10-16 (×2): qty 1

## 2015-10-16 MED ORDER — ASPIRIN EC 325 MG PO TBEC
325.0000 mg | DELAYED_RELEASE_TABLET | Freq: Every day | ORAL | Status: DC
  Administered 2015-10-17 – 2015-10-30 (×13): 325 mg via ORAL
  Filled 2015-10-16 (×15): qty 1

## 2015-10-16 MED ORDER — MORPHINE SULFATE (PF) 2 MG/ML IV SOLN
1.0000 mg | INTRAVENOUS | Status: AC | PRN
  Administered 2015-10-16: 4 mg via INTRAVENOUS
  Administered 2015-10-16: 2 mg via INTRAVENOUS
  Filled 2015-10-16: qty 2
  Filled 2015-10-16: qty 1

## 2015-10-16 MED ORDER — FENTANYL CITRATE (PF) 250 MCG/5ML IJ SOLN
INTRAMUSCULAR | Status: AC
  Filled 2015-10-16: qty 30

## 2015-10-16 MED ORDER — ROCURONIUM BROMIDE 50 MG/5ML IV SOLN
INTRAVENOUS | Status: AC
  Filled 2015-10-16: qty 2

## 2015-10-16 MED ORDER — INSULIN REGULAR BOLUS VIA INFUSION
0.0000 [IU] | Freq: Three times a day (TID) | INTRAVENOUS | Status: DC
  Filled 2015-10-16: qty 10

## 2015-10-16 MED ORDER — PHENYLEPHRINE HCL 10 MG/ML IJ SOLN
0.0000 ug/min | INTRAMUSCULAR | Status: DC
  Filled 2015-10-16: qty 1

## 2015-10-16 MED ORDER — FENTANYL CITRATE (PF) 250 MCG/5ML IJ SOLN
INTRAMUSCULAR | Status: AC
  Filled 2015-10-16: qty 5

## 2015-10-16 MED ORDER — VANCOMYCIN HCL IN DEXTROSE 1-5 GM/200ML-% IV SOLN
1000.0000 mg | Freq: Once | INTRAVENOUS | Status: AC
  Administered 2015-10-16: 1000 mg via INTRAVENOUS
  Filled 2015-10-16 (×2): qty 200

## 2015-10-16 MED ORDER — POTASSIUM CHLORIDE 10 MEQ/50ML IV SOLN: 10.0000 meq | Freq: Once | INTRAVENOUS | Status: DC

## 2015-10-16 MED ORDER — METOPROLOL TARTRATE 25 MG/10 ML ORAL SUSPENSION: 12.5000 mg | Freq: Two times a day (BID) | ORAL | Status: DC

## 2015-10-16 MED ORDER — ANTISEPTIC ORAL RINSE SOLUTION (CORINZ)
7.0000 mL | OROMUCOSAL | Status: DC
  Administered 2015-10-16 (×2): 7 mL via OROMUCOSAL

## 2015-10-16 MED ORDER — MIDAZOLAM HCL 2 MG/2ML IJ SOLN
2.0000 mg | INTRAMUSCULAR | Status: DC | PRN
  Filled 2015-10-16: qty 2

## 2015-10-16 MED ORDER — POTASSIUM CHLORIDE 10 MEQ/50ML IV SOLN
10.0000 meq | Freq: Once | INTRAVENOUS | Status: AC
  Administered 2015-10-16: 10 meq via INTRAVENOUS

## 2015-10-16 MED ORDER — HEPARIN SODIUM (PORCINE) 1000 UNIT/ML IJ SOLN
INTRAMUSCULAR | Status: AC
  Filled 2015-10-16: qty 1

## 2015-10-16 MED ORDER — ACETAMINOPHEN 160 MG/5ML PO SOLN: 1000.0000 mg | Freq: Four times a day (QID) | ORAL | Status: AC

## 2015-10-16 MED ORDER — PROTAMINE SULFATE 10 MG/ML IV SOLN
INTRAVENOUS | Status: DC | PRN
  Administered 2015-10-16: 150 mg via INTRAVENOUS

## 2015-10-16 MED ORDER — SODIUM CHLORIDE 0.9 % IJ SOLN
INTRAMUSCULAR | Status: AC
  Filled 2015-10-16: qty 10

## 2015-10-16 MED ORDER — FAMOTIDINE IN NACL 20-0.9 MG/50ML-% IV SOLN
20.0000 mg | Freq: Two times a day (BID) | INTRAVENOUS | Status: AC
  Administered 2015-10-16: 20 mg via INTRAVENOUS

## 2015-10-16 MED ORDER — ACETAMINOPHEN 650 MG RE SUPP
650.0000 mg | Freq: Once | RECTAL | Status: AC
  Administered 2015-10-16: 650 mg via RECTAL

## 2015-10-16 MED ORDER — CETYLPYRIDINIUM CHLORIDE 0.05 % MT LIQD
7.0000 mL | Freq: Two times a day (BID) | OROMUCOSAL | Status: DC
  Administered 2015-10-17 – 2015-10-20 (×2): 7 mL via OROMUCOSAL

## 2015-10-16 MED ORDER — SODIUM CHLORIDE 0.9 % IV SOLN: 250.0000 mL | INTRAVENOUS | Status: DC

## 2015-10-16 MED ORDER — INSULIN ASPART 100 UNIT/ML ~~LOC~~ SOLN
0.0000 [IU] | SUBCUTANEOUS | Status: DC
Start: 2015-10-16 — End: 2015-10-17
  Administered 2015-10-16 – 2015-10-17 (×5): 2 [IU] via SUBCUTANEOUS

## 2015-10-16 MED ORDER — ALBUMIN HUMAN 5 % IV SOLN
250.0000 mL | INTRAVENOUS | Status: AC | PRN
  Administered 2015-10-16 (×4): 250 mL via INTRAVENOUS
  Filled 2015-10-16: qty 250

## 2015-10-16 MED ORDER — LACTATED RINGERS IV SOLN: 500.0000 mL | Freq: Once | INTRAVENOUS | Status: DC | PRN

## 2015-10-16 MED ORDER — ROCURONIUM BROMIDE 100 MG/10ML IV SOLN
INTRAVENOUS | Status: DC | PRN
  Administered 2015-10-16 (×2): 50 mg via INTRAVENOUS

## 2015-10-16 MED ORDER — 0.9 % SODIUM CHLORIDE (POUR BTL) OPTIME
TOPICAL | Status: DC | PRN
  Administered 2015-10-16: 6000 mL

## 2015-10-16 MED ORDER — ASPIRIN 81 MG PO CHEW
324.0000 mg | CHEWABLE_TABLET | Freq: Every day | ORAL | Status: DC
  Administered 2015-10-24: 324 mg
  Filled 2015-10-16 (×5): qty 4

## 2015-10-16 MED ORDER — ACETAMINOPHEN 500 MG PO TABS
1000.0000 mg | ORAL_TABLET | Freq: Four times a day (QID) | ORAL | Status: AC
  Administered 2015-10-17 – 2015-10-21 (×13): 1000 mg via ORAL
  Filled 2015-10-16 (×15): qty 2

## 2015-10-16 MED ORDER — SODIUM CHLORIDE 0.9 % IV SOLN
INTRAVENOUS | Status: DC
  Administered 2015-10-16 – 2015-10-18 (×2): via INTRAVENOUS

## 2015-10-16 MED ORDER — BISACODYL 10 MG RE SUPP
10.0000 mg | Freq: Every day | RECTAL | Status: DC
Start: 2015-10-17 — End: 2015-10-26
  Filled 2015-10-16: qty 1

## 2015-10-16 MED ORDER — DEXTROSE 5 % IV SOLN
1.5000 g | Freq: Two times a day (BID) | INTRAVENOUS | Status: AC
  Administered 2015-10-16 – 2015-10-18 (×4): 1.5 g via INTRAVENOUS
  Filled 2015-10-16 (×4): qty 1.5

## 2015-10-16 MED ORDER — PROPOFOL 10 MG/ML IV BOLUS
INTRAVENOUS | Status: AC
  Filled 2015-10-16: qty 20

## 2015-10-16 MED ORDER — POTASSIUM CHLORIDE 10 MEQ/50ML IV SOLN
10.0000 meq | INTRAVENOUS | Status: AC
Start: 2015-10-16 — End: 2015-10-16
  Administered 2015-10-16 (×3): 10 meq via INTRAVENOUS

## 2015-10-16 MED FILL — Magnesium Sulfate Inj 50%: INTRAMUSCULAR | Qty: 10 | Status: AC

## 2015-10-16 MED FILL — Potassium Chloride Inj 2 mEq/ML: INTRAVENOUS | Qty: 40 | Status: AC

## 2015-10-16 MED FILL — Heparin Sodium (Porcine) Inj 1000 Unit/ML: INTRAMUSCULAR | Qty: 30 | Status: AC

## 2015-10-16 SURGICAL SUPPLY — 89 items
BAG DECANTER FOR FLEXI CONT (MISCELLANEOUS) ×4 IMPLANT
BANDAGE ACE 4X5 VEL STRL LF (GAUZE/BANDAGES/DRESSINGS) ×4 IMPLANT
BANDAGE ACE 6X5 VEL STRL LF (GAUZE/BANDAGES/DRESSINGS) ×4 IMPLANT
BANDAGE ELASTIC 4 VELCRO ST LF (GAUZE/BANDAGES/DRESSINGS) ×4 IMPLANT
BANDAGE ELASTIC 6 VELCRO ST LF (GAUZE/BANDAGES/DRESSINGS) ×4 IMPLANT
BASKET HEART  (ORDER IN 25'S) (MISCELLANEOUS) ×1
BASKET HEART (ORDER IN 25'S) (MISCELLANEOUS) ×1
BASKET HEART (ORDER IN 25S) (MISCELLANEOUS) ×2 IMPLANT
BLADE STERNUM SYSTEM 6 (BLADE) ×4 IMPLANT
BNDG GAUZE ELAST 4 BULKY (GAUZE/BANDAGES/DRESSINGS) ×4 IMPLANT
CANISTER SUCTION 2500CC (MISCELLANEOUS) ×4 IMPLANT
CANNULA EZ GLIDE AORTIC 21FR (CANNULA) ×4 IMPLANT
CATH CPB KIT HENDRICKSON (MISCELLANEOUS) ×4 IMPLANT
CATH ROBINSON RED A/P 18FR (CATHETERS) ×4 IMPLANT
CATH THORACIC 36FR (CATHETERS) ×4 IMPLANT
CATH THORACIC 36FR RT ANG (CATHETERS) ×4 IMPLANT
CLIP TI MEDIUM 24 (CLIP) IMPLANT
CLIP TI WIDE RED SMALL 24 (CLIP) ×8 IMPLANT
CRADLE DONUT ADULT HEAD (MISCELLANEOUS) ×4 IMPLANT
DRAPE CARDIOVASCULAR INCISE (DRAPES) ×2
DRAPE SLUSH/WARMER DISC (DRAPES) ×4 IMPLANT
DRAPE SRG 135X102X78XABS (DRAPES) ×2 IMPLANT
DRSG AQUACEL AG ADV 3.5X14 (GAUZE/BANDAGES/DRESSINGS) ×4 IMPLANT
DRSG COVADERM 4X14 (GAUZE/BANDAGES/DRESSINGS) ×4 IMPLANT
ELECT REM PT RETURN 9FT ADLT (ELECTROSURGICAL) ×8
ELECTRODE REM PT RTRN 9FT ADLT (ELECTROSURGICAL) ×4 IMPLANT
FELT TEFLON 1X6 (MISCELLANEOUS) ×4 IMPLANT
GAUZE SPONGE 4X4 12PLY STRL (GAUZE/BANDAGES/DRESSINGS) ×8 IMPLANT
GAUZE XEROFORM 5X9 LF (GAUZE/BANDAGES/DRESSINGS) ×4 IMPLANT
GLOVE BIO SURGEON STRL SZ 6 (GLOVE) ×8 IMPLANT
GLOVE BIO SURGEON STRL SZ7 (GLOVE) ×24 IMPLANT
GLOVE BIO SURGEON STRL SZ7.5 (GLOVE) ×8 IMPLANT
GLOVE SURG SIGNA 7.5 PF LTX (GLOVE) ×12 IMPLANT
GOWN STRL REUS W/ TWL LRG LVL3 (GOWN DISPOSABLE) ×14 IMPLANT
GOWN STRL REUS W/ TWL XL LVL3 (GOWN DISPOSABLE) ×4 IMPLANT
GOWN STRL REUS W/TWL LRG LVL3 (GOWN DISPOSABLE) ×14
GOWN STRL REUS W/TWL XL LVL3 (GOWN DISPOSABLE) ×4
HEMOSTAT POWDER SURGIFOAM 1G (HEMOSTASIS) ×12 IMPLANT
HEMOSTAT SURGICEL 2X14 (HEMOSTASIS) ×4 IMPLANT
INSERT FOGARTY XLG (MISCELLANEOUS) IMPLANT
KIT BASIN OR (CUSTOM PROCEDURE TRAY) ×4 IMPLANT
KIT ROOM TURNOVER OR (KITS) ×4 IMPLANT
KIT SUCTION CATH 14FR (SUCTIONS) ×8 IMPLANT
KIT VASOVIEW 6 PRO VH 2400 (KITS) ×4 IMPLANT
MARKER GRAFT CORONARY BYPASS (MISCELLANEOUS) ×12 IMPLANT
NS IRRIG 1000ML POUR BTL (IV SOLUTION) ×20 IMPLANT
PACK OPEN HEART (CUSTOM PROCEDURE TRAY) ×4 IMPLANT
PAD ARMBOARD 7.5X6 YLW CONV (MISCELLANEOUS) ×8 IMPLANT
PAD ELECT DEFIB RADIOL ZOLL (MISCELLANEOUS) ×4 IMPLANT
PENCIL BUTTON HOLSTER BLD 10FT (ELECTRODE) ×4 IMPLANT
PUNCH AORTIC ROTATE 4.0MM (MISCELLANEOUS) ×4 IMPLANT
PUNCH AORTIC ROTATE 4.5MM 8IN (MISCELLANEOUS) IMPLANT
PUNCH AORTIC ROTATE 5MM 8IN (MISCELLANEOUS) IMPLANT
SET CARDIOPLEGIA MPS 5001102 (MISCELLANEOUS) ×4 IMPLANT
SPONGE GAUZE 4X4 12PLY STER LF (GAUZE/BANDAGES/DRESSINGS) ×8 IMPLANT
SUT BONE WAX W31G (SUTURE) ×4 IMPLANT
SUT ETHIBOND NAB MH 2-0 36IN (SUTURE) ×4 IMPLANT
SUT MNCRL AB 4-0 PS2 18 (SUTURE) IMPLANT
SUT PROLENE 3 0 SH 48 (SUTURE) ×4 IMPLANT
SUT PROLENE 3 0 SH DA (SUTURE) ×8 IMPLANT
SUT PROLENE 4 0 RB 1 (SUTURE) ×4
SUT PROLENE 4 0 SH DA (SUTURE) IMPLANT
SUT PROLENE 4-0 RB1 .5 CRCL 36 (SUTURE) ×4 IMPLANT
SUT PROLENE 6 0 C 1 30 (SUTURE) ×8 IMPLANT
SUT PROLENE 7 0 BV1 MDA (SUTURE) ×4 IMPLANT
SUT PROLENE 8 0 BV175 6 (SUTURE) IMPLANT
SUT STEEL 6MS V (SUTURE) ×4 IMPLANT
SUT STEEL STERNAL CCS#1 18IN (SUTURE) IMPLANT
SUT STEEL SZ 6 DBL 3X14 BALL (SUTURE) ×4 IMPLANT
SUT VIC AB 1 CTX 36 (SUTURE) ×4
SUT VIC AB 1 CTX36XBRD ANBCTR (SUTURE) ×4 IMPLANT
SUT VIC AB 2-0 CT1 27 (SUTURE) ×2
SUT VIC AB 2-0 CT1 TAPERPNT 27 (SUTURE) ×2 IMPLANT
SUT VIC AB 2-0 CTX 27 (SUTURE) IMPLANT
SUT VIC AB 3-0 SH 27 (SUTURE)
SUT VIC AB 3-0 SH 27X BRD (SUTURE) IMPLANT
SUT VIC AB 3-0 X1 27 (SUTURE) ×4 IMPLANT
SUT VICRYL 4-0 PS2 18IN ABS (SUTURE) IMPLANT
SUTURE E-PAK OPEN HEART (SUTURE) ×4 IMPLANT
SYSTEM SAHARA CHEST DRAIN ATS (WOUND CARE) ×4 IMPLANT
TAPE CLOTH SURG 4X10 WHT LF (GAUZE/BANDAGES/DRESSINGS) ×4 IMPLANT
TAPE PAPER 2X10 WHT MICROPORE (GAUZE/BANDAGES/DRESSINGS) ×4 IMPLANT
TOWEL OR 17X24 6PK STRL BLUE (TOWEL DISPOSABLE) ×8 IMPLANT
TOWEL OR 17X26 10 PK STRL BLUE (TOWEL DISPOSABLE) ×8 IMPLANT
TRAY FOLEY IC TEMP SENS 16FR (CATHETERS) ×4 IMPLANT
TUBE FEEDING 8FR 16IN STR KANG (MISCELLANEOUS) ×4 IMPLANT
TUBING INSUFFLATION (TUBING) ×4 IMPLANT
UNDERPAD 30X30 INCONTINENT (UNDERPADS AND DIAPERS) ×4 IMPLANT
WATER STERILE IRR 1000ML POUR (IV SOLUTION) ×8 IMPLANT

## 2015-10-16 NOTE — Procedures (Signed)
Extubation Procedure Note  Patient Details:   Name: Tonya Payne DOB: [birth year] MRN: [id number]   Airway Documentation:     Evaluation  O2 sats: stable throughout Complications: No apparent complications Patient did tolerate procedure well. Bilateral Breath Sounds: Clear, Diminished   Yes   Pt extubated to 4L N/C.  No stridor noted.  RN at bedside.  NIF greater than -20, VC ~700 mL.  Christophe LouisSteven D Gionni Vaca 10/16/2015, 5:32 PM

## 2015-10-16 NOTE — Brief Op Note (Addendum)
10/06/2015 - 10/16/2015      [address]       [place] [id number]             [phone]     10/06/2015 - 10/16/2015  10:34 AM  PATIENT:  Tonya AlmSuann Payne  81 y.o. female  PRE-OPERATIVE DIAGNOSIS:  CAD  POST-OPERATIVE DIAGNOSIS:  CAD  PROCEDURE:  Procedure(s): CORONARY ARTERY BYPASS GRAFTING (CABG) x 3   LIMA to LAD;   SVG to DIAGONAL;   SVG to RCA ENDOSCOPIC GREATER SAPHENOUS VEIN HARVEST(EVH) RIGHT THIGH TRANSESOPHAGEAL ECHOCARDIOGRAM (TEE)  SURGEON:  Surgeon(s): Loreli SlotSteven C Nolene Rocks, MD  PHYSICIAN ASSISTANT: WAYNE GOLD PA-C  ANESTHESIA:   general  PATIENT CONDITION:  ICU - intubated and hemodynamically stable.  PRE-OPERATIVE WEIGHT: 47kg  COMPLICATIONS: NO KNOWN  XC= 51 min CPB= 82 min Good targets, good conduits TEE- EF ~ 40%, mild MR

## 2015-10-16 NOTE — Progress Notes (Signed)
Patient ID: Tonya Payne, female   DOB: 02/03/1935, 81 y.o.   MRN: [id number]  SICU Evening Rounds:   Hemodynamically stable  CI = 1.9  Extubated, sleepy  Urine output good  CT output low  CBC    Component Value Date/Time   WBC 3.9* 10/16/2015 1225   RBC 3.53* 10/16/2015 1225   HGB 10.3* 10/16/2015 1225   HGB 10.9* 10/16/2015 1225   HCT 31.6* 10/16/2015 1225   HCT 32.0* 10/16/2015 1225   PLT 124* 10/16/2015 1225   MCV 89.5 10/16/2015 1225   MCH 29.2 10/16/2015 1225   MCHC 32.6 10/16/2015 1225   RDW 15.7* 10/16/2015 1225   LYMPHSABS 1.5 10/06/2015 1506   MONOABS 1.5* 10/06/2015 1506   EOSABS 0.0 10/06/2015 1506   BASOSABS 0.0 10/06/2015 1506     BMET    Component Value Date/Time   NA 144 10/16/2015 1225   K 3.3* 10/16/2015 1225   CL 106 10/16/2015 1118   CO2 19* 10/16/2015 0356   CO2 19* 10/16/2015 0356   GLUCOSE 83 10/16/2015 1225   BUN 5* 10/16/2015 1118   CREATININE 0.50 10/16/2015 1118   CALCIUM 9.3 10/16/2015 0356   CALCIUM 9.2 10/16/2015 0356   GFRNONAA 51* 10/16/2015 0356   GFRNONAA 48* 10/16/2015 0356   GFRAA 59* 10/16/2015 0356   GFRAA 56* 10/16/2015 0356     A/P:  Stable postop course. Continue current plans. Check ABG 1 hr post-extubation.

## 2015-10-16 NOTE — OR Nursing (Signed)
11:15 - 45 minute call to SICU

## 2015-10-16 NOTE — Transfer of Care (Signed)
Immediate Anesthesia Transfer of Care Note  Patient: Tonya Payne  Procedure(s) Performed: Procedure(s): CORONARY ARTERY BYPASS GRAFTING (CABG)  x three, using left internal mammary artery and right leg greater saphenous vein harvested endoscopically (N/A) TRANSESOPHAGEAL ECHOCARDIOGRAM (TEE) (N/A)  Patient Location: PACU  Anesthesia Type:General  Level of Consciousness: Patient remains intubated per anesthesia plan  Airway & Oxygen Therapy: Patient remains intubated per anesthesia plan and Patient placed on Ventilator (see vital sign flow sheet for setting)  Post-op Assessment: Report given to RN and Post -op Vital signs reviewed and stable  Post vital signs: Reviewed and stable  Last Vitals:  Filed Vitals:   10/16/15 0531 10/16/15 1224  BP: 146/66 113/74  Pulse: 73 82  Temp: 36.6 C   Resp: 20 12    Last Pain:  Filed Vitals:   10/16/15 1225  PainSc: 5       Patients Stated Pain Goal: 0 (10/14/15 2110)  Complications: No apparent anesthesia complications

## 2015-10-16 NOTE — Anesthesia Procedure Notes (Signed)
Procedure Name: Intubation Date/Time: 10/16/2015 7:38 AM Performed by: Coralee RudFLORES, Shar Paez Pre-anesthesia Checklist: Patient identified, Emergency Drugs available and Suction available Patient Re-evaluated:Patient Re-evaluated prior to inductionOxygen Delivery Method: Circle system utilized Preoxygenation: Pre-oxygenation with 100% oxygen Intubation Type: IV induction Laryngoscope Size: 3 and Miller Grade View: Grade I Tube type: Oral Tube size: 7.0 mm Number of attempts: 1 Airway Equipment and Method: Stylet Placement Confirmation: ETT inserted through vocal cords under direct vision,  positive ETCO2 and breath sounds checked- equal and bilateral Secured at: 22 cm Tube secured with: Tape Dental Injury: Teeth and Oropharynx as per pre-operative assessment

## 2015-10-16 NOTE — OR Nursing (Signed)
11:40 - 20 minute call to SICU

## 2015-10-16 NOTE — Progress Notes (Signed)
Notified Dr. Laneta SimmersBartle of pre-extubation gas, NIF and VC parameters; orders to give 1 amp of bicarb and ok to extubate.  Hermina BartersBOWMAN, Cierah Crader M, RN

## 2015-10-16 NOTE — Anesthesia Preprocedure Evaluation (Signed)
Anesthesia Evaluation  Patient identified by MRN, date of birth, ID band Patient awake    Reviewed: Allergy & Precautions, NPO status , Patient's Chart, lab work & pertinent test results  Airway Mallampati: II   Neck ROM: full    Dental   Pulmonary COPD, Current Smoker,    breath sounds clear to auscultation       Cardiovascular + CAD and + Past MI   Rhythm:regular Rate:Normal  EF 40%   Neuro/Psych PSYCHIATRIC DISORDERS Anxiety Depression TIA   GI/Hepatic   Endo/Other    Renal/GU      Musculoskeletal   Abdominal   Peds  Hematology   Anesthesia Other Findings   Reproductive/Obstetrics                             Anesthesia Physical Anesthesia Plan  ASA: III  Anesthesia Plan: General   Post-op Pain Management:    Induction: Intravenous  Airway Management Planned: Oral ETT  Additional Equipment: Arterial line, CVP, PA Cath, Ultrasound Guidance Line Placement and TEE  Intra-op Plan:   Post-operative Plan: Post-operative intubation/ventilation  Informed Consent: I have reviewed the patients History and Physical, chart, labs and discussed the procedure including the risks, benefits and alternatives for the proposed anesthesia with the patient or authorized representative who has indicated his/her understanding and acceptance.     Plan Discussed with: CRNA, Anesthesiologist and Surgeon  Anesthesia Plan Comments:         Anesthesia Quick Evaluation

## 2015-10-16 NOTE — Interval H&P Note (Signed)
History and Physical Interval Note:  10/16/2015 7:11 AM  Wyn QuakerSuann Trish MageHagmann  has presented today for surgery, with the diagnosis of CAD  The various methods of treatment have been discussed with the patient and family. After consideration of risks, benefits and other options for treatment, the patient has consented to  Procedure(s): CORONARY ARTERY BYPASS GRAFTING (CABG) (N/A) TRANSESOPHAGEAL ECHOCARDIOGRAM (TEE) (N/A) as a surgical intervention .  The patient's history has been reviewed, patient examined, no change in status, stable for surgery.  I have reviewed the patient's chart and labs.  Questions were answered to the patient's satisfaction.     Loreli SlotSteven C Cherokee Clowers

## 2015-10-16 NOTE — H&P (View-Only) (Signed)
7 Days Post-Op Procedure(s) (LRB): Left Heart Cath and Coronary Angiography (N/A) Coronary Balloon Angioplasty (N/A) Subjective: Just back from a walk. No CP but did get SOB  Objective: Vital signs in last 24 hours: Temp:  [98.3 F (36.8 C)-98.5 F (36.9 C)] 98.4 F (36.9 C) (06/09 0407) Pulse Rate:  [70-80] 70 (06/09 1008) Cardiac Rhythm:  [-] Normal sinus rhythm (06/09 0700) Resp:  [16-18] 18 (06/09 0407) BP: (125-149)/(55-77) 134/55 mmHg (06/09 1008) SpO2:  [98 %-100 %] 99 % (06/09 0407) Weight:  [106 lb 14.4 oz (48.49 kg)] 106 lb 14.4 oz (48.49 kg) (06/09 0407)  Hemodynamic parameters for last 24 hours:    Intake/Output from previous day: 06/08 0701 - 06/09 0700 In: 240 [P.O.:240] Out: -  Intake/Output this shift: Total I/O In: 240 [P.O.:240] Out: -   General appearance: alert, cooperative and no distress Neurologic: intact Heart: regular rate and rhythm Lungs: diminished breath sounds bilaterally  Lab Results:  Recent Labs  10/12/15 0300 10/13/15 0355  WBC 5.3 5.4  HGB 10.7* 10.5*  HCT 33.2* 32.9*  PLT 266 268   BMET:  Recent Labs  10/12/15 0300  NA 134*  K 3.7  CL 108  CO2 19*  GLUCOSE 100*  BUN 9  CREATININE 1.17*  CALCIUM 9.1    PT/INR: No results for input(s): LABPROT, INR in the last 72 hours. ABG    Component Value Date/Time   TCO2 22 10/06/2015 1501   CBG (last 3)  No results for input(s): GLUCAP in the last 72 hours.  Assessment/Plan: S/P Procedure(s) (LRB): Left Heart Cath and Coronary Angiography (N/A) Coronary Balloon Angioplasty (N/A) -  For CABG on Monday 10/16/2015 No Cp since cath She has significant COPD with an FEV1 of 0.8(51% of predicted) She is aware of risks and benefits and agrees to proceed   LOS: 7 days    Shaundrea Carrigg C Jermaine Tholl 10/13/2015   

## 2015-10-16 NOTE — Progress Notes (Signed)
  Echocardiogram Echocardiogram Transesophageal has been performed.  Tonya Payne, Tonya Payne 10/16/2015, 8:33 AM

## 2015-10-17 ENCOUNTER — Encounter (HOSPITAL_COMMUNITY): Payer: Self-pay | Admitting: Thoracic Surgery (Cardiothoracic Vascular Surgery)

## 2015-10-17 ENCOUNTER — Inpatient Hospital Stay (HOSPITAL_COMMUNITY): Payer: Medicare Other

## 2015-10-17 LAB — CBC
HCT: 29.2 % — ABNORMAL LOW (ref 36.0–46.0)
HEMATOCRIT: 31.4 % — AB (ref 36.0–46.0)
HEMOGLOBIN: 10.2 g/dL — AB (ref 12.0–15.0)
HEMOGLOBIN: 9.4 g/dL — AB (ref 12.0–15.0)
MCH: 29.4 pg (ref 26.0–34.0)
MCH: 29.4 pg (ref 26.0–34.0)
MCHC: 32.5 g/dL (ref 30.0–36.0)
MCHC: 32.2 g/dL (ref 30.0–36.0)
MCV: 90.5 fL (ref 78.0–100.0)
MCV: 91.3 fL (ref 78.0–100.0)
Platelets: 152 10*3/uL (ref 150–400)
Platelets: 148 10*3/uL — ABNORMAL LOW (ref 150–400)
RBC: 3.47 MIL/uL — AB (ref 3.87–5.11)
RBC: 3.2 MIL/uL — AB (ref 3.87–5.11)
RDW: 16.6 % — ABNORMAL HIGH (ref 11.5–15.5)
RDW: 16.4 % — ABNORMAL HIGH (ref 11.5–15.5)
WBC: 17.1 10*3/uL — ABNORMAL HIGH (ref 4.0–10.5)
WBC: 14.8 10*3/uL — ABNORMAL HIGH (ref 4.0–10.5)

## 2015-10-17 LAB — GLUCOSE, CAPILLARY
GLUCOSE-CAPILLARY: 117 mg/dL — AB (ref 65–99)
GLUCOSE-CAPILLARY: 122 mg/dL — AB (ref 65–99)
GLUCOSE-CAPILLARY: 141 mg/dL — AB (ref 65–99)
GLUCOSE-CAPILLARY: 143 mg/dL — AB (ref 65–99)
Glucose-Capillary: 124 mg/dL — ABNORMAL HIGH (ref 65–99)
Glucose-Capillary: 99 mg/dL (ref 65–99)

## 2015-10-17 LAB — CREATININE, SERUM
CREATININE: 1.6 mg/dL — AB (ref 0.44–1.00)
GFR, EST AFRICAN AMERICAN: 34 mL/min — AB (ref >60)
GFR, EST NON AFRICAN AMERICAN: 30 mL/min — AB (ref >60)

## 2015-10-17 LAB — BASIC METABOLIC PANEL
Anion gap: 9 (ref 5–15)
BUN: 10 mg/dL (ref 6–20)
CHLORIDE: 109 mmol/L (ref 101–111)
CO2: 20 mmol/L — AB (ref 22–32)
Calcium: 8.2 mg/dL — ABNORMAL LOW (ref 8.9–10.3)
Creatinine, Ser: 1.18 mg/dL — ABNORMAL HIGH (ref 0.44–1.00)
GFR calc Af Amer: 49 mL/min — ABNORMAL LOW (ref >60)
GFR calc non Af Amer: 43 mL/min — ABNORMAL LOW (ref >60)
GLUCOSE: 135 mg/dL — AB (ref 65–99)
POTASSIUM: 4.6 mmol/L (ref 3.5–5.1)
Sodium: 138 mmol/L (ref 135–145)

## 2015-10-17 LAB — POCT I-STAT, CHEM 8
BUN: 15 mg/dL (ref 6–20)
CHLORIDE: 102 mmol/L (ref 101–111)
Calcium, Ion: 1.18 mmol/L (ref 1.13–1.30)
Creatinine, Ser: 1.5 mg/dL — ABNORMAL HIGH (ref 0.44–1.00)
GLUCOSE: 135 mg/dL — AB (ref 65–99)
HCT: 30 % — ABNORMAL LOW (ref 36.0–46.0)
Hemoglobin: 10.2 g/dL — ABNORMAL LOW (ref 12.0–15.0)
POTASSIUM: 4.2 mmol/L (ref 3.5–5.1)
Sodium: 137 mmol/L (ref 135–145)
TCO2: 22 mmol/L (ref 0–100)

## 2015-10-17 LAB — HEMOGLOBIN A1C
Hgb A1c MFr Bld: 5.2 % (ref 4.8–5.6)
MEAN PLASMA GLUCOSE: 103 mg/dL

## 2015-10-17 LAB — MAGNESIUM
MAGNESIUM: 2.4 mg/dL (ref 1.7–2.4)
Magnesium: 2.2 mg/dL (ref 1.7–2.4)

## 2015-10-17 MED ORDER — IPRATROPIUM-ALBUTEROL 0.5-2.5 (3) MG/3ML IN SOLN
3.0000 mL | Freq: Four times a day (QID) | RESPIRATORY_TRACT | Status: DC
  Administered 2015-10-17 – 2015-10-19 (×9): 3 mL via RESPIRATORY_TRACT
  Filled 2015-10-17 (×9): qty 3

## 2015-10-17 MED ORDER — OLANZAPINE 5 MG PO TABS
5.0000 mg | ORAL_TABLET | Freq: Every day | ORAL | Status: DC
  Administered 2015-10-17 – 2015-10-29 (×12): 5 mg via ORAL
  Filled 2015-10-17 (×14): qty 1

## 2015-10-17 MED ORDER — ALPRAZOLAM 0.25 MG PO TABS
0.2500 mg | ORAL_TABLET | Freq: Every morning | ORAL | Status: DC
  Administered 2015-10-17 – 2015-10-20 (×3): 0.25 mg via ORAL
  Filled 2015-10-17 (×4): qty 1

## 2015-10-17 MED ORDER — INSULIN ASPART 100 UNIT/ML ~~LOC~~ SOLN
0.0000 [IU] | SUBCUTANEOUS | Status: DC
  Administered 2015-10-17: 2 [IU] via SUBCUTANEOUS

## 2015-10-17 MED ORDER — ENOXAPARIN SODIUM 40 MG/0.4ML ~~LOC~~ SOLN
40.0000 mg | Freq: Every day | SUBCUTANEOUS | Status: DC
  Administered 2015-10-17: 40 mg via SUBCUTANEOUS
  Filled 2015-10-17: qty 0.4

## 2015-10-17 MED ORDER — FUROSEMIDE 10 MG/ML IJ SOLN
20.0000 mg | Freq: Once | INTRAMUSCULAR | Status: AC
  Administered 2015-10-17: 20 mg via INTRAVENOUS
  Filled 2015-10-17: qty 2

## 2015-10-17 MED ORDER — SERTRALINE HCL 50 MG PO TABS
50.0000 mg | ORAL_TABLET | Freq: Every morning | ORAL | Status: DC
  Administered 2015-10-17 – 2015-10-30 (×14): 50 mg via ORAL
  Filled 2015-10-17 (×14): qty 1

## 2015-10-17 MED ORDER — IPRATROPIUM-ALBUTEROL 20-100 MCG/ACT IN AERS: 2.0000 | INHALATION_SPRAY | Freq: Four times a day (QID) | RESPIRATORY_TRACT | Status: DC

## 2015-10-17 MED FILL — Heparin Sodium (Porcine) Inj 1000 Unit/ML: INTRAMUSCULAR | Qty: 10 | Status: AC

## 2015-10-17 MED FILL — Sodium Chloride IV Soln 0.9%: INTRAVENOUS | Qty: 2000 | Status: AC

## 2015-10-17 MED FILL — Sodium Bicarbonate IV Soln 8.4%: INTRAVENOUS | Qty: 50 | Status: AC

## 2015-10-17 MED FILL — Mannitol IV Soln 20%: INTRAVENOUS | Qty: 500 | Status: AC

## 2015-10-17 MED FILL — Lidocaine HCl IV Inj 20 MG/ML: INTRAVENOUS | Qty: 5 | Status: AC

## 2015-10-17 MED FILL — Electrolyte-R (PH 7.4) Solution: INTRAVENOUS | Qty: 3000 | Status: AC

## 2015-10-17 NOTE — Progress Notes (Signed)
Patient ID: Tonya Payne, female   DOB: 05-11-1934, 81 y.o.   MRN: [id number] EVENING ROUNDS NOTE :     [address]       [place] [id number]             [phone]                 1 Day Post-Op Procedure(s) (LRB): CORONARY ARTERY BYPASS GRAFTING (CABG)  x three, using left internal mammary artery and right leg greater saphenous vein harvested endoscopically (N/A) TRANSESOPHAGEAL ECHOCARDIOGRAM (TEE) (N/A)  Total Length of Stay:  LOS: 11 days  BP 92/50 mmHg  Pulse 93  Temp(Src) 98 F (36.7 C) (Oral)  Resp 20  Ht 4' 11.84" (1.52 m)  Wt 117 lb 8.1 oz (53.3 kg)  BMI 23.07 kg/m2  SpO2 95%  .Intake/Output      06/12 0701 - 06/13 0700 06/13 0701 - 06/14 0700   P.O.  240   I.V. (mL/kg) 3094.7 (58.1) 50 (0.9)   Blood 368    NG/GT 30    IV Piggyback 2450 50   Total Intake(mL/kg) 5942.7 (111.5) 340 (6.4)   Urine (mL/kg/hr) 2355 (1.8) 365 (0.6)   Blood 850 (0.7)    Chest Tube 310 (0.2) 10 (0)   Total Output 3515 375   Net +2427.7 -35          . sodium chloride Stopped (10/17/15 0900)  . sodium chloride Stopped (10/17/15 0800)  . sodium chloride Stopped (10/17/15 0900)  . dexmedetomidine Stopped (10/16/15 1524)  . insulin (NOVOLIN-R) infusion Stopped (10/16/15 1436)  . lactated ringers Stopped (10/16/15 1700)  . lactated ringers Stopped (10/17/15 0900)  . nitroGLYCERIN Stopped (10/16/15 1605)  . phenylephrine (NEO-SYNEPHRINE) Adult infusion Stopped (10/16/15 1309)     Lab Results  Component Value Date   WBC 14.8* 10/17/2015   HGB 10.2* 10/17/2015   HCT 30.0* 10/17/2015   PLT 148* 10/17/2015   GLUCOSE 135* 10/17/2015   CHOL 200 10/07/2015   TRIG 112 10/07/2015   HDL 85 10/07/2015   LDLCALC 93 10/07/2015   ALT 14 10/06/2015   AST 30 10/06/2015   NA 137 10/17/2015   K 4.2 10/17/2015   CL 102 10/17/2015   CREATININE 1.50* 10/17/2015   BUN 15 10/17/2015   CO2 20* 10/17/2015   TSH 4.405 10/06/2015   INR 1.51* 10/16/2015   HGBA1C 5.2 10/16/2015     Stable day, alert Complains of her bad knee, "preop" PT OT to see  Delight OvensEdward B Samvel Zinn MD  Beeper [phone] Office [phone] 10/17/2015 6:58 PM

## 2015-10-17 NOTE — Progress Notes (Signed)
Dr. Dorris FetchHendrickson made aware of chest xray findings that were called in by radiology.  Hermina BartersBOWMAN, Saphira Lahmann M, RN

## 2015-10-17 NOTE — Progress Notes (Signed)
1 Day Post-Op Procedure(s) (LRB): CORONARY ARTERY BYPASS GRAFTING (CABG)  x three, using left internal mammary artery and right leg greater saphenous vein harvested endoscopically (N/A) TRANSESOPHAGEAL ECHOCARDIOGRAM (TEE) (N/A) Subjective: Confused earlier, seems better now Oriented to person, place and year  Objective: Vital signs in last 24 hours: Temp:  [95 F (35 C)-100.1 F (37.8 C)] 99 F (37.2 C) (06/13 0805) Pulse Rate:  [72-93] 80 (06/13 0805) Cardiac Rhythm:  [-] Normal sinus rhythm (06/13 0800) Resp:  [11-27] 23 (06/13 0805) BP: (79-142)/(50-81) 110/60 mmHg (06/13 0800) SpO2:  [95 %-100 %] 98 % (06/13 0805) Arterial Line BP: (90-161)/(36-72) 109/43 mmHg (06/13 0805) FiO2 (%):  [40 %-100 %] 50 % (06/12 1700) Weight:  [117 lb 8.1 oz (53.3 kg)] 117 lb 8.1 oz (53.3 kg) (06/13 0420)  Hemodynamic parameters for last 24 hours: PAP: (19-50)/(6-34) 24/8 mmHg CO:  [2 L/min-3.7 L/min] 3.7 L/min CI:  [1.4 L/min/m2-2.7 L/min/m2] 2.7 L/min/m2  Intake/Output from previous day: 06/12 0701 - 06/13 0700 In: 5942.7 [I.V.:3094.7; Blood:368; NG/GT:30; IV Piggyback:2450] Out: 3515 [Urine:2355; Blood:850; Chest Tube:310] Intake/Output this shift: Total I/O In: 50 [I.V.:50] Out: 30 [Urine:30]  General appearance: alert and cooperative Neurologic: no focal weakness Heart: regular rate and rhythm Lungs: diminished breath sounds bibasilar Abdomen: normal findings: soft, non-tender  Lab Results:  Recent Labs  10/16/15 1823 10/16/15 1832 10/17/15 0426  WBC 12.2*  --  17.1*  HGB 9.5* 9.5* 10.2*  HCT 29.2* 28.0* 31.4*  PLT 126*  --  152   BMET:  Recent Labs  10/16/15 0356  10/16/15 1832 10/17/15 0426  NA 137  137  < > 140 138  K 3.8  3.8  < > 4.3 4.6  CL 109  110  < > 111 109  CO2 19*  19*  --   --  20*  GLUCOSE 102*  102*  < > 147* 135*  BUN 8  8  < > 8 10  CREATININE 1.07*  1.02*  < > 0.80 1.18*  CALCIUM 9.2  9.3  --   --  8.2*  < > = values in this interval  not displayed.  PT/INR:  Recent Labs  10/16/15 1225  LABPROT 18.2*  INR 1.51*   ABG    Component Value Date/Time   PHART 7.323* 10/16/2015 1826   HCO3 21.0 10/16/2015 1826   TCO2 22 10/16/2015 1832   ACIDBASEDEF 5.0* 10/16/2015 1826   O2SAT 97.0 10/16/2015 1826   CBG (last 3)   Recent Labs  10/16/15 2021 10/16/15 2341 10/17/15 0412  GLUCAP 145* 141* 124*    Assessment/Plan: S/P Procedure(s) (LRB): CORONARY ARTERY BYPASS GRAFTING (CABG)  x three, using left internal mammary artery and right leg greater saphenous vein harvested endoscopically (N/A) TRANSESOPHAGEAL ECHOCARDIOGRAM (TEE) (N/A) --  CV- stable. In SR. Cardiac output 3.7 (was 2.0 going into OR)- dc swan  ASA, beta blocker, statin   RESP- COPD- IS, will add combivent inhaler  I question whether radiology reading of a tiny pneumothorax is accurate, it looks too symmetrical, may be a skin fold. In any case it is insignificant clinically  RENAL- creatinine up to 1.18 slightly above baseline- follow  ENDO- CBG OK on SSI  Anemia- acute secondary to ABL on chronic. Received 2 units intraop, has been stable postop  DVT prophylaxis- SCD + enoxaparin  OOB   LOS: 11 days    Loreli SlotSteven C Hendrickson 10/17/2015

## 2015-10-17 NOTE — Care Management Note (Signed)
Case Management Note  Patient Details  Name: Tonya Payne MRN: [id number] Date of Birth: Jun 26, 1934  Subjective/Objective:      Admitted with Code STEMI              Action/Plan:  PTA - independent from [facility] [place].  Pending CABG 10/13/15 .  Daughter in law at bedside; pt doesn't have recommended supervision post  tentative CABG discharge - per daughter in law;  Pt has small room and no one else can fit in for overnights and her home has steps that pt can not travel to get to bedrooms.  CM will continue to follow for discharge needs.      Expected Discharge Date:                  Expected Discharge Plan:  Skilled Nursing Facility  In-House Referral:  Clinical Social Work  Discharge planning Services  CM Consult  Post Acute Care Choice:    Choice offered to:     DME Arranged:    DME Agency:     HH Arranged:    HH Agency:     Status of Service:  In process, will continue to follow  Medicare Important Message Given:  Yes Date Medicare IM Given:    Medicare IM give by:    Date Additional Medicare IM Given:    Additional Medicare Important Message give by:     If discussed at Long Length of Stay Meetings, dates discussed:    Additional Comments: 10/17/2015 Pt had CABG 10/16/15 - CM requested PT/OT consult Cherylann ParrClaxton, Rudi Bunyard S, RN 10/17/2015, 3:08 PM

## 2015-10-17 NOTE — Progress Notes (Signed)
At approximately 0630 patient audibly crying out in pain.  I was sitting outside the patient room.  Upon entering, the patient was holding her chest tubes, atrial pacing wires, and dressing.  She was pulling on the chest tubes.  It appears that the pacing wires are still in place.  Patient  does not currently require pacing.  The chest tube on the patient's left appears to have been pulled out about 2 cm.  The chest tube on the patient's right appears to be in place.  While replacing the dressing, Ms. Hyppolite continued to pick at her skin, ECG leads, and dressing.  Safety mittens were applied and the importance of medical equipment was explained.  Ms. Trish MageHagmann was unable to tell me what type of surgery she had.  A&O to person and place.

## 2015-10-17 NOTE — Op Note (Signed)
Tonya Payne, Tonya Payne               ACCOUNT NO.:  [id number]  MEDICAL RECORD NO.:  [id number]  LOCATION:  2S10C                        FACILITY:  [facility]  PHYSICIAN:  Salvatore Decent. [name], M.D.DATE OF BIRTH:  1934/06/30  DATE OF PROCEDURE:  10/17/2015 DATE OF DISCHARGE:                              OPERATIVE REPORT   PREOPERATIVE DIAGNOSIS:  Left main disease, status post ST-elevation myocardial infarction.  POSTOPERATIVE DIAGNOSIS:  Left main disease, status post ST-elevation myocardial infarction.  PROCEDURES:  Median sternotomy, extracorporeal circulation, Coronary artery bypass grafting x 3   Left internal mammary artery to left anterior descending artery,  Ssaphenous vein graft to diagonal,  Saphenous vein graft to right coronary Endoscopic vein harvest right thigh.  SURGEON:  Salvatore Decent. [name], M.D.  ASSISTANT:  Rowe Clack, P.A.-C.  ANESTHESIA:  General.  FINDINGS:  Transesophageal echocardiography showed ejection fraction of approximately 40%.  There was mild mitral regurgitation.  Good quality targets.  Good quality conduits.  Low cardiac output prior to surgery, improved with low-dose dopamine post bypass.  CLINICAL NOTE:  Tonya Payne is a 81 year old woman, who presented with an ST-elevation MI.  She was taken to the Catheterization Laboratory and had angioplasty of her LAD.  Unfortunately, there was incomplete expansion of the balloon and a stent could not be placed.  She had significant calcified stenoses in the LAD and diagonal system and moderate disease in the right coronary.  The circumflex was a very small vessel.  She was advised to undergo coronary artery bypass grafting. The indications, risks, benefits, and alternatives were discussed in detail with the patient.  She understood and accepted the risks and agreed to proceed.  OPERATIVE NOTE:  Tonya Payne was brought to the preoperative holding area on October 16, 2015.  Anesthesia placed a Swan-Ganz  catheter and an arterial blood pressure monitoring line.  She was taken to the operating room, anesthetized and intubated.  A Foley catheter was placed. Intravenous antibiotics were administered.  The chest, abdomen and legs were prepped and draped in usual sterile fashion.  Transesophageal echocardiography was performed and revealed mild mitral regurgitation with ejection fraction of approximately 40%.  Of note, her cardiac output was 1.8-2 L/minute.  A median sternotomy was performed.  There was sternal osteoporosis.  The left internal mammary artery was harvested using standard technique. Simultaneously, an incision was made in the medial aspect of the right leg at the level of the knee. The greater saphenous vein was harvested from the right thigh endoscopically.  2000 units of heparin was administered during the vessel harvest.  Remainder of the full heparin dose was given prior to opening the pericardium. Both the mammary artery and saphenous vein were good quality conduits.  The pericardium was opened.  The ascending aorta was inspected.  It was of normal caliber.  There was calcific plaque near the takeoff of the innominate.  This area was avoided.  The remainder of the aorta appeared relatively normal.  After confirming adequate anticoagulation with ACT measurement, the aorta was cannulated via concentric 2-0 Ethibond pledgeted pursestring sutures.  A dual-stage venous cannula was placed via a pursestring suture in the right atrial appendage.  Cardiopulmonary bypass was initiated.  Flows were maintained per protocol.  The patient was cooled to 32 degrees Celsius.  The coronary arteries were inspected and anastomotic sites were chosen.  The conduits were inspected and prepared.  A foam pad was placed in the pericardium to insulate the heart.  A temperature probe was placed in the myocardial septum and a cardioplegia cannula was placed in the ascending aorta.  The aorta was  crossclamped.  The left ventricle was emptied via the aortic root vent.  Cardiac arrest then was achieved with cold antegrade blood cardioplegia and topical iced saline. 750 mL of cardioplegia was administered.  There was a rapid diastolic arrest and septal cooling to 10 degrees Celsius.  A reversed saphenous vein graft was placed end-to-side to the distal right coronary.  This was a 2.5-mm good quality target vessel.  The vein was good quality.  An end-to-side anastomosis was performed with a running 7-0 Prolene suture.  At the completion of the anastomosis, cardioplegia was administered.  There was good flow and good hemostasis.  A reversed saphenous vein graft then was placed end-to-side to the first diagonal branch to the LAD.  This was a large anterolateral branch that trifurcated just beyond the anastomosis.  A 1.5-mm probe did pass into the branch vessels.  The vein was good quality, it was anastomosed end- to-side with a running 7-0 Prolene suture. With cardioplegia administration there was good flow and good hemostasis.  The left internal mammary artery was brought through a window in the pericardium.  The distal end was beveled.  It was anastomosed end-to- side to the distal LAD.  The LAD was a 2-mm good quality target.  The mammary was a 2-mm good quality conduit.  The end-to-side anastomosis was performed with a running 8-0 Prolene suture.  At the completion of anastomosis, the bulldog clamp was removed to inspect for hemostasis. Rapid septal rewarming was noted.  The bulldog clamp was replaced and the mammary pedicle was tacked to the epicardial surface of the heart with 6-0 Prolene sutures.  Additional cardioplegia was administered.  The vein grafts were cut to length.  The cardioplegia cannula was removed from the ascending aorta. The proximal vein graft anastomoses were performed to 4.5-mm punch aortotomies with running 6-0 Prolene sutures.  At the completion of  the final proximal anastomosis, the patient was placed in Trendelenburg position.  Lidocaine was administered.  The bulldog clamp was again removed from the left mammary artery.  The aortic root was de-aired and the aortic crossclamp was removed.  The total crossclamp time was 51 minutes.  While rewarming was completed, all proximal and distal anastomoses were inspected for hemostasis.  Epicardial pacing wires were placed on the right ventricle and right atrium.  Atrial pacing was initiated for bradycardia.  When the patient had rewarmed to a core temperature of 37 degrees Celsius, she was weaned from cardiopulmonary bypass on the first attempt.  She was on a low-dose dopamine infusion varying from 3-5 mcg/kg/min at the time of separation from bypass.  Total bypass time was 82 minutes.  Initial cardiac index was low, but with volume administration, rose to greater than 2 L/min/m2 (approximately 50% higher than it had been on arrival into the operating room).  A test dose of protamine was administered and was well tolerated.  The atrial and aortic cannulae were removed.  The remainder of the protamine was administered without incident.  The chest was irrigated with warm saline.  Hemostasis was achieved.  Left pleural and mediastinal chest  tubes were placed through separate subcostal incisions.  The pericardium was not closed.  The sternum was closed with a combination of single and double heavy-gauge stainless steel wires.  Pectoralis fascia, subcutaneous tissue, and skin were closed in standard fashion.  All sponge, needle, and instrument counts were correct at the end of the procedure.  The patient was taken from the operating room to the Surgical Intensive Care Unit intubated and in good condition.     Salvatore DecentSteven C. Dorris FetchHendrickson, M.D.     SCH/MEDQ  D:  10/17/2015  T:  10/17/2015  Job:  [id number]

## 2015-10-17 NOTE — Care Management Important Message (Signed)
Important Message  Patient Details  Name: Julio AlmSuann Neumeyer MRN: [id number] Date of Birth: 03-11-1935   Medicare Important Message Given:  Yes    Bernadette HoitShoffner, Eleanore Junio Coleman 10/17/2015, 8:38 AM

## 2015-10-18 ENCOUNTER — Inpatient Hospital Stay (HOSPITAL_COMMUNITY): Payer: Medicare Other

## 2015-10-18 LAB — CBC
HCT: 25.8 % — ABNORMAL LOW (ref 36.0–46.0)
HEMOGLOBIN: 8.4 g/dL — AB (ref 12.0–15.0)
MCH: 29.8 pg (ref 26.0–34.0)
MCHC: 32.6 g/dL (ref 30.0–36.0)
MCV: 91.5 fL (ref 78.0–100.0)
PLATELETS: 130 10*3/uL — AB (ref 150–400)
RBC: 2.82 MIL/uL — AB (ref 3.87–5.11)
RDW: 16.4 % — ABNORMAL HIGH (ref 11.5–15.5)
WBC: 9.8 10*3/uL (ref 4.0–10.5)

## 2015-10-18 LAB — GLUCOSE, CAPILLARY
GLUCOSE-CAPILLARY: 105 mg/dL — AB (ref 65–99)
GLUCOSE-CAPILLARY: 113 mg/dL — AB (ref 65–99)
GLUCOSE-CAPILLARY: 116 mg/dL — AB (ref 65–99)
GLUCOSE-CAPILLARY: 117 mg/dL — AB (ref 65–99)
GLUCOSE-CAPILLARY: 67 mg/dL (ref 65–99)
GLUCOSE-CAPILLARY: 94 mg/dL (ref 65–99)
Glucose-Capillary: 132 mg/dL — ABNORMAL HIGH (ref 65–99)

## 2015-10-18 LAB — BASIC METABOLIC PANEL
Anion gap: 6 (ref 5–15)
BUN: 18 mg/dL (ref 6–20)
CALCIUM: 7.9 mg/dL — AB (ref 8.9–10.3)
CHLORIDE: 106 mmol/L (ref 101–111)
CO2: 22 mmol/L (ref 22–32)
CREATININE: 1.65 mg/dL — AB (ref 0.44–1.00)
GFR calc Af Amer: 33 mL/min — ABNORMAL LOW (ref >60)
GFR calc non Af Amer: 28 mL/min — ABNORMAL LOW (ref >60)
GLUCOSE: 121 mg/dL — AB (ref 65–99)
Potassium: 3.7 mmol/L (ref 3.5–5.1)
Sodium: 134 mmol/L — ABNORMAL LOW (ref 135–145)

## 2015-10-18 MED ORDER — INSULIN ASPART 100 UNIT/ML ~~LOC~~ SOLN
0.0000 [IU] | Freq: Three times a day (TID) | SUBCUTANEOUS | Status: DC
  Administered 2015-10-18: 1 [IU] via SUBCUTANEOUS

## 2015-10-18 MED ORDER — ENOXAPARIN SODIUM 30 MG/0.3ML ~~LOC~~ SOLN
30.0000 mg | Freq: Every day | SUBCUTANEOUS | Status: DC
  Administered 2015-10-19 – 2015-10-29 (×11): 30 mg via SUBCUTANEOUS
  Filled 2015-10-18 (×11): qty 0.3

## 2015-10-18 MED ORDER — METOPROLOL TARTRATE 25 MG/10 ML ORAL SUSPENSION
25.0000 mg | Freq: Two times a day (BID) | ORAL | Status: DC
  Filled 2015-10-18: qty 10

## 2015-10-18 MED ORDER — METOPROLOL TARTRATE 25 MG PO TABS
25.0000 mg | ORAL_TABLET | Freq: Two times a day (BID) | ORAL | Status: DC
  Administered 2015-10-19 (×2): 25 mg via ORAL
  Filled 2015-10-18 (×2): qty 1

## 2015-10-18 MED ORDER — POTASSIUM CHLORIDE 10 MEQ/50ML IV SOLN
10.0000 meq | INTRAVENOUS | Status: AC | PRN
  Administered 2015-10-18 (×3): 10 meq via INTRAVENOUS
  Filled 2015-10-18: qty 50

## 2015-10-18 MED ORDER — HALOPERIDOL LACTATE 5 MG/ML IJ SOLN
1.0000 mg | INTRAMUSCULAR | Status: DC | PRN
  Filled 2015-10-18: qty 1

## 2015-10-18 MED ORDER — FUROSEMIDE 10 MG/ML IJ SOLN
20.0000 mg | Freq: Two times a day (BID) | INTRAMUSCULAR | Status: AC
  Administered 2015-10-18 (×2): 20 mg via INTRAVENOUS
  Filled 2015-10-18 (×2): qty 2

## 2015-10-18 NOTE — Evaluation (Signed)
Occupational Therapy Evaluation Patient Details Name: Tonya Payne MRN: [id number] DOB: 1934-06-06 Today's Date: 10/18/2015    History of Present Illness Pt admit with STEMI.  Pt had CABG x3.    Clinical Impression   This 81 yo female admitted and underwent above presents to acute OT with deficits below (see OT problem list) thus affecting her PLOF to living alone at a Mod I level (used DME) with A for her IADLs. She will benefit from acute OT with follow up OT at SNF to be able to get back to her PLOF and return home.    Follow Up Recommendations  SNF    Equipment Recommendations   (TBD at next venue)       Precautions / Restrictions Precautions Precautions: Fall;Sternal Restrictions Weight Bearing Restrictions: Yes Other Position/Activity Restrictions: sternal--pt needs VCs to follow sternal precautions      Mobility Bed Mobility               General bed mobility comments: pt was in chair  Transfers Overall transfer level: Needs assistance Equipment used: Pushed w/c Transfers: Sit to/from Stand Sit to Stand: Mod assist;+2 safety/equipment         General transfer comment: pt needed assist to power up with cues for sternal precautions.     Balance Overall balance assessment: Needs assistance;History of Falls Sitting-balance support: No upper extremity supported;Feet unsupported Sitting balance-[name]: Poor Sitting balance - Comments: needs UE support to sit EOB.   Standing balance support: Bilateral upper extremity supported;During functional activity Standing balance-[name]: Poor Standing balance comment: relies on Bil UE support                            ADL Overall ADL's : Needs assistance/impaired Eating/Feeding: Independent;Sitting   Grooming: Set up;Sitting   Upper Body Bathing: Set up;Sitting   Lower Body Bathing: Minimal assistance;Sit to/from stand   Upper Body Dressing : Moderate assistance;Sitting   Lower  Body Dressing: Moderate assistance (min A sit<>stand)   Toilet Transfer: Minimal assistance;Ambulation (bed>20 feet> sit in recliner behind her; pushing W/C)   Toileting- Clothing Manipulation and Hygiene: Moderate assistance (min A sit<>stand)                         Pertinent Vitals/Pain Pain Assessment: Faces Faces Pain Scale: Hurts little more Pain Location: incisiona pain Pain Descriptors / Indicators: Grimacing;Guarding Pain Intervention(s): Limited activity within patient's tolerance;Monitored during session;Repositioned     Hand Dominance Right   Extremity/Trunk Assessment Upper Extremity Assessment Upper Extremity Assessment: Overall WFL for tasks assessed (within sternal precautions; LUE mildly weaker than RUE; however pt is right handed)   Lower Extremity Assessment Lower Extremity Assessment: RLE deficits/detail;LLE deficits/detail RLE Deficits / Details: grossly 3-/5 LLE Deficits / Details: grossly 3-/5   Cervical / Trunk Assessment Cervical / Trunk Assessment: Kyphotic   Communication Communication Communication: No difficulties   Cognition Arousal/Alertness: Awake/alert Behavior During Therapy: Flat affect Overall Cognitive Status: Impaired/Different from baseline Area of Impairment: Following commands;Safety/judgement;Problem solving       Following Commands: Follows one step commands with increased time Safety/Judgement: Decreased awareness of safety   Problem Solving: Difficulty sequencing;Requires verbal cues;Requires tactile cues;Decreased initiation                Home Living Family/patient expects to be discharged to:: Skilled nursing facility Living Arrangements: Alone Available Help at Discharge: Friend(s);Available PRN/intermittently Type of Home: House Home Access:  Stairs to enter Enter[employer] CorporationEntrance Stairs-Number of Steps: 2 Entrance Stairs-Rails: None Home Layout: One level               Home Equipment: Walker - 4 wheels;Cane  - single point          Prior Functioning/Environment Level of Independence: Needs assistance  Gait / Transfers Assistance Needed: pt reports she used rollator ADL's / Homemaking Assistance Needed: pt was able to bathe and dress herself   Comments: Pt had friends drive for her.     OT Diagnosis: Generalized weakness;Acute pain   OT Problem List: Decreased strength;Impaired balance (sitting and/or standing);Pain;Decreased cognition;Decreased knowledge of precautions;Decreased knowledge of use of DME or AE   OT Treatment/Interventions: Self-care/ADL training;Patient/family education;Balance training;Therapeutic activities;DME and/or AE instruction;Cognitive remediation/compensation    OT Goals(Current goals can be found in the care plan section) Acute Rehab OT Goals Patient Stated Goal: to get better OT Goal Formulation: With patient Time For Goal Achievement: 11/01/15 Potential to Achieve Goals: Good  OT Frequency: Min 2X/week   Barriers to D/C: Decreased caregiver support          Co-evaluation PT/OT/SLP Co-Evaluation/Treatment: Yes Reason for Co-Treatment: Complexity of the patient's impairments (multi-system involvement) PT goals addressed during session: Mobility/safety with mobility OT goals addressed during session: ADL's and self-care;Strengthening/ROM      End of Session Equipment Utilized During Treatment: Gait belt;Rolling walker;Oxygen  Activity Tolerance: Patient tolerated treatment well Patient left: in chair;with call bell/phone within reach;with chair alarm set   Time: [phone] OT Time Calculation (min): 24 min Charges:  OT General Charges $OT Visit: 1 Procedure OT Evaluation $OT Eval Moderate Complexity: 1 Procedure  Evette GeorgesLeonard, Kianna Billet Eva [phone] 10/18/2015, 11:34 AM

## 2015-10-18 NOTE — Progress Notes (Signed)
2 Days Post-Op Procedure(s) (LRB): CORONARY ARTERY BYPASS GRAFTING (CABG)  x three, using left internal mammary artery and right leg greater saphenous vein harvested endoscopically (N/A) TRANSESOPHAGEAL ECHOCARDIOGRAM (TEE) (N/A) Subjective: C/o pain around sleeve in right neck. Feels weak  Objective: Vital signs in last 24 hours: Temp:  [97.5 F (36.4 C)-99.3 F (37.4 C)] 97.9 F (36.6 C) (06/14 0743) Pulse Rate:  [73-101] 83 (06/14 0700) Cardiac Rhythm:  [-] Normal sinus rhythm (06/13 2300) Resp:  [10-26] 11 (06/14 0700) BP: (78-123)/(42-81) 113/59 mmHg (06/14 0700) SpO2:  [91 %-100 %] 98 % (06/14 0700) Arterial Line BP: (109-127)/(41-49) 127/49 mmHg (06/13 0900) Weight:  [119 lb 1.6 oz (54.023 kg)] 119 lb 1.6 oz (54.023 kg) (06/14 0438)  Hemodynamic parameters for last 24 hours: PAP: (24-27)/(8-13) 27/13 mmHg CO:  [3.7 L/min] 3.7 L/min CI:  [2.7 L/min/m2] 2.7 L/min/m2  Intake/Output from previous day: 06/13 0701 - 06/14 0700 In: 590 [P.O.:440; I.V.:50; IV Piggyback:100] Out: 611 [Urine:601; Chest Tube:10] Intake/Output this shift:    General appearance: alert, cooperative and no distress Neurologic: + tremor, no focal weakness, intermittent confusion Heart: regular rate and rhythm Lungs: diminished breath sounds bibasilar Abdomen: soft, NT, + BS  Lab Results:  Recent Labs  10/17/15 1605 10/17/15 1606 10/18/15 0357  WBC 14.8*  --  9.8  HGB 9.4* 10.2* 8.4*  HCT 29.2* 30.0* 25.8*  PLT 148*  --  130*   BMET:  Recent Labs  10/17/15 0426  10/17/15 1606 10/18/15 0357  NA 138  --  137 134*  K 4.6  --  4.2 3.7  CL 109  --  102 106  CO2 20*  --   --  22  GLUCOSE 135*  --  135* 121*  BUN 10  --  15 18  CREATININE 1.18*  < > 1.50* 1.65*  CALCIUM 8.2*  --   --  7.9*  < > = values in this interval not displayed.  PT/INR:  Recent Labs  10/16/15 1225  LABPROT 18.2*  INR 1.51*   ABG    Component Value Date/Time   PHART 7.323* 10/16/2015 1826   HCO3 21.0  10/16/2015 1826   TCO2 22 10/17/2015 1606   ACIDBASEDEF 5.0* 10/16/2015 1826   O2SAT 97.0 10/16/2015 1826   CBG (last 3)   Recent Labs  10/17/15 2349 10/18/15 0404 10/18/15 0740  GLUCAP 105* 116* 113*    Assessment/Plan: S/P Procedure(s) (LRB): CORONARY ARTERY BYPASS GRAFTING (CABG)  x three, using left internal mammary artery and right leg greater saphenous vein harvested endoscopically (N/A) TRANSESOPHAGEAL ECHOCARDIOGRAM (TEE) (N/A) POD # 2 CABG  NEURO- some confusion but reorients easily, no focal deficit  CV- BP has been a little soft, maintaining SR  RESP_ saturations Ok, has bibasilar atelectasis, bilateral effusions  IS, diuresis  RENAL- creatinine up yesterday afternoon, relatively stable this AM  Likely ATN. Had good cardiac output postop but was very low going in  Monitor UO and follow labs  ENDO- CBG well controlled, change to AC/HS  SCD + enoxaparin for DVT prophylaxis- decrease enoxaparin to 30 mg- age, size, creatinine  Mobilize as tolerated   LOS: 12 days    Loreli SlotSteven C Hendrickson 10/18/2015

## 2015-10-18 NOTE — Progress Notes (Signed)
Patient in deep sleep, woke patient and recycled BP 101/58 (71)

## 2015-10-18 NOTE — Progress Notes (Signed)
Patient examined and record reviewed.Hemodynamics stable,labs satisfactory.Patient had stable day.Continue current care. Kathlee Nationseter Van Trigt III 10/18/2015

## 2015-10-18 NOTE — Progress Notes (Signed)
Dr. Dorris FetchHendrickson made aware of patients 2 SVT events, 1st event 190-200 very brief, <1 min., 2nd event 170-180 <1 min. Both events occurred after strenuous activity. Dr. Dorris FetchHendrickson put in orders to increase BID metoprolol to 25mg .  Raygen Linquist, Geroge BasemanHEATHER M, RN

## 2015-10-18 NOTE — Evaluation (Signed)
Physical Therapy Evaluation Patient Details Name: Tonya Payne MRN: [id number] DOB: October 07, 1934 Today's Date: 10/18/2015   History of Present Illness  Pt admit with STEMI.  Pt had CABG x3.   Clinical Impression  Pt admitted with above diagnosis. Pt currently with functional limitations due to the deficits listed below (see PT Problem List). Pt was able to ambulate pushing wheelchair however needed mod assist of 2 and mod cues for safety.  SNF would benefit pt to improve mobility.  Will follow acutely.  Pt will benefit from skilled PT to increase their independence and safety with mobility to allow discharge to the venue listed below.      Follow Up Recommendations SNF;Supervision/Assistance - 24 hour    Equipment Recommendations  Other (comment) (TBA)    Recommendations for Other Services       Precautions / Restrictions Precautions Precautions: Fall Restrictions Weight Bearing Restrictions: Yes (sternal precautions)      Mobility  Bed Mobility               General bed mobility comments: pt was in chair  Transfers Overall transfer level: Needs assistance Equipment used: Pushed w/c Transfers: Sit to/from Stand Sit to Stand: Mod assist;+2 safety/equipment         General transfer comment: pt needed assist to power up with cues for sternal precautions.   Ambulation/Gait Ambulation/Gait assistance: Mod assist;+2 safety/equipment Ambulation Distance (Feet): 200 Feet (50 feet, 50 feet and then 100 feet) Assistive device:  (pushed wheelchair) Gait Pattern/deviations: Step-to pattern;Decreased step length - right;Decreased step length - left;Decreased stride length;Shuffle;Trunk flexed;Wide base of support;Staggering right;Staggering left   Gait velocity interpretation: Below normal speed for age/gender General Gait Details: Pt with very poor gait overall.  Pt with wide BOS with shortened step length bil needing constant cues to take larger steps.  Pt slightly  impulsive.  Followed pt with chair so she could rest if needed.  Pt needed to take 2 sitting  rest breaks during walk due to poor postural stability and left LE pain/sore.  Overall pt with poor balance. Needed cues to stay close to wheelchair as well.   Stairs            Wheelchair Mobility    Modified Rankin (Stroke Patients Only)       Balance Overall balance assessment: Needs assistance;History of Falls Sitting-balance support: No upper extremity supported;Feet supported Sitting balance-[name]: Poor Sitting balance - Comments: needs UE support to sit EOB.   Standing balance support: Bilateral upper extremity supported;During functional activity Standing balance-[name]: Poor Standing balance comment: relies on bil UE support.                             Pertinent Vitals/Pain Pain Assessment: Faces Faces Pain Scale: Hurts little more Pain Location: pt had incisional pain Pain Descriptors / Indicators: Guarding;Grimacing Pain Intervention(s): Limited activity within patient's tolerance;Monitored during session;Repositioned  90-97% on 3LO2 with ambulation.  125/81.    Home Living Family/patient expects to be discharged to:: Private residence Living Arrangements: Alone Available Help at Discharge: Friend(s);Available PRN/intermittently Type of Home: House Home Access: Stairs to enter Entrance Stairs-Rails: None Entrance Stairs-Number of Steps: 2 Home Layout: One level Home Equipment: Walker - 4 wheels;Cane - single point      Prior Function Level of Independence: Needs assistance   Gait / Transfers Assistance Needed: pt reports she used rollator  ADL's / Homemaking Assistance Needed: pt was able to bathe  and dress herself  Comments: Pt had friends drive for her.      Hand Dominance        Extremity/Trunk Assessment   Upper Extremity Assessment: Defer to OT evaluation           Lower Extremity Assessment: RLE deficits/detail;LLE  deficits/detail RLE Deficits / Details: grossly 3-/5 LLE Deficits / Details: grossly 3-/5  Cervical / Trunk Assessment: Kyphotic  Communication   Communication: No difficulties  Cognition Arousal/Alertness: Awake/alert Behavior During Therapy: Flat affect Overall Cognitive Status: Impaired/Different from baseline Area of Impairment: Following commands;Safety/judgement;Problem solving       Following Commands: Follows one step commands with increased time Safety/Judgement: Decreased awareness of safety   Problem Solving: Difficulty sequencing;Requires verbal cues;Requires tactile cues;Decreased initiation      General Comments General comments (skin integrity, edema, etc.): Desat with activity unless she has O2.      Exercises        Assessment/Plan    PT Assessment Patient needs continued PT services  PT Diagnosis Generalized weakness   PT Problem List Decreased activity tolerance;Decreased balance;Decreased mobility;Decreased knowledge of use of DME;Decreased safety awareness;Decreased knowledge of precautions;Decreased strength  PT Treatment Interventions DME instruction;Gait training;Functional mobility training;Therapeutic activities;Therapeutic exercise;Balance training;Patient/family education   PT Goals (Current goals can be found in the Care Plan section) Acute Rehab PT Goals Patient Stated Goal: to get better PT Goal Formulation: With patient Time For Goal Achievement: 11/01/15 Potential to Achieve Goals: Good    Frequency Min 3X/week   Barriers to discharge Decreased caregiver support      Co-evaluation PT/OT/SLP Co-Evaluation/Treatment: Yes Reason for Co-Treatment: Complexity of the patient's impairments (multi-system involvement) PT goals addressed during session: Mobility/safety with mobility         End of Session Equipment Utilized During Treatment: Gait belt;Oxygen Activity Tolerance: Patient limited by fatigue Patient left: in chair;with call  bell/phone within reach Nurse Communication: Mobility status         Time: [id number] PT Time Calculation (min) (ACUTE ONLY): 25 min   Charges:   PT Evaluation $PT Eval Moderate Complexity: 1 Procedure PT Treatments $Gait Training: 8-22 mins   PT G CodesBerline Payne:        Tonya Payne 10/18/2015, 11:22 AM Tisha Cline,PT Acute Rehabilitation [phone] (pager)

## 2015-10-18 NOTE — Progress Notes (Addendum)
Hypoglycemic Event  CBG: 67  Treatment: 15 GM carbohydrate snack  Symptoms: None  Follow-up CBG: Time: CBG Result: 94  Possible Reasons for Event: Inadequate meal intake  Comments/MD notified:Dr. Dorris FetchHendrickson aware, making rounds    Tonya Payne

## 2015-10-19 ENCOUNTER — Inpatient Hospital Stay (HOSPITAL_COMMUNITY): Payer: Medicare Other

## 2015-10-19 LAB — TYPE AND SCREEN
ABO/RH(D): A POS
ANTIBODY SCREEN: NEGATIVE
UNIT DIVISION: 0
UNIT DIVISION: 0
UNIT DIVISION: 0
Unit division: 0
Unit division: 0
Unit division: 0

## 2015-10-19 LAB — BASIC METABOLIC PANEL
Anion gap: 5 (ref 5–15)
Anion gap: 7 (ref 5–15)
BUN: 23 mg/dL — ABNORMAL HIGH (ref 6–20)
BUN: 22 mg/dL — AB (ref 6–20)
CALCIUM: 8 mg/dL — AB (ref 8.9–10.3)
CHLORIDE: 104 mmol/L (ref 101–111)
CHLORIDE: 103 mmol/L (ref 101–111)
CO2: 24 mmol/L (ref 22–32)
CO2: 24 mmol/L (ref 22–32)
CREATININE: 1.67 mg/dL — AB (ref 0.44–1.00)
CREATININE: 1.6 mg/dL — AB (ref 0.44–1.00)
Calcium: 8 mg/dL — ABNORMAL LOW (ref 8.9–10.3)
GFR calc Af Amer: 33 mL/min — ABNORMAL LOW (ref >60)
GFR calc Af Amer: 34 mL/min — ABNORMAL LOW (ref >60)
GFR calc non Af Amer: 28 mL/min — ABNORMAL LOW (ref >60)
GFR, EST NON AFRICAN AMERICAN: 30 mL/min — AB (ref >60)
GLUCOSE: 105 mg/dL — AB (ref 65–99)
GLUCOSE: 98 mg/dL (ref 65–99)
Potassium: 3.9 mmol/L (ref 3.5–5.1)
Potassium: 4.3 mmol/L (ref 3.5–5.1)
SODIUM: 134 mmol/L — AB (ref 135–145)
Sodium: 133 mmol/L — ABNORMAL LOW (ref 135–145)

## 2015-10-19 LAB — GLUCOSE, CAPILLARY
GLUCOSE-CAPILLARY: 90 mg/dL (ref 65–99)
GLUCOSE-CAPILLARY: 104 mg/dL — AB (ref 65–99)
GLUCOSE-CAPILLARY: 107 mg/dL — AB (ref 65–99)
Glucose-Capillary: 110 mg/dL — ABNORMAL HIGH (ref 65–99)

## 2015-10-19 LAB — CBC
HCT: 27.2 % — ABNORMAL LOW (ref 36.0–46.0)
Hemoglobin: 8.7 g/dL — ABNORMAL LOW (ref 12.0–15.0)
MCH: 29.4 pg (ref 26.0–34.0)
MCHC: 32 g/dL (ref 30.0–36.0)
MCV: 91.9 fL (ref 78.0–100.0)
PLATELETS: 158 10*3/uL (ref 150–400)
RBC: 2.96 MIL/uL — ABNORMAL LOW (ref 3.87–5.11)
RDW: 16 % — ABNORMAL HIGH (ref 11.5–15.5)
WBC: 7.9 10*3/uL (ref 4.0–10.5)

## 2015-10-19 MED ORDER — FUROSEMIDE 40 MG PO TABS
40.0000 mg | ORAL_TABLET | Freq: Every day | ORAL | Status: DC
  Administered 2015-10-19 – 2015-10-25 (×7): 40 mg via ORAL
  Filled 2015-10-19 (×7): qty 1

## 2015-10-19 MED ORDER — IPRATROPIUM-ALBUTEROL 0.5-2.5 (3) MG/3ML IN SOLN: 3.0000 mL | Freq: Four times a day (QID) | RESPIRATORY_TRACT | Status: DC | PRN

## 2015-10-19 NOTE — Care Management Note (Signed)
Case Management Note  Patient Details  Name: Tonya Payne MRN: [id number] Date of Birth: May 10, 1934  Subjective/Objective:      Admitted with Code STEMI              Action/Plan:  PTA - [place] [facility] [place].  Pending CABG 10/13/15 .  Daughter in law at bedside; pt doesn't have recommended supervision post  tentative CABG discharge - per daughter in law;  Pt has small room and no one else can fit in for overnights and her home has steps that pt can not travel to get to bedrooms.  CM will continue to follow for discharge needs.      Expected Discharge Date:                  Expected Discharge Plan:  Skilled Nursing Facility  In-House Referral:  Clinical Social Work  Discharge planning Services  CM Consult  Post Acute Care Choice:    Choice offered to:     DME Arranged:    DME Agency:     HH Arranged:    HH Agency:     Status of Service:  In process, will continue to follow  Medicare Important Message Given:  Yes Date Medicare IM Given:    Medicare IM give by:    Date Additional Medicare IM Given:    Additional Medicare Important Message give by:     If discussed at Long Length of Stay Meetings, dates discussed:    Additional Comments: 10/19/2015  CSW consulted for SNF placement  10/17/15 Pt had CABG 10/16/15 - CM requested PT/OT consult Tonya Payne, Tonya Blick S, RN 10/19/2015, 9:46 AM

## 2015-10-19 NOTE — Progress Notes (Signed)
Pt acutely agitated, anxious, paranoid, and verbally and physically aggressive. Combative towards staff (slapping and hitting), stating that we are all trying to kill her. Yelling and throwing objects across room. Trying to pull out urinary catheter. Refused all PO meds. Disoriented to time and situation, poor judgment, poor safety awareness. Keeps trying to get out of bed by herself, ignoring sternal precautions even after re-orientation. Safety mitts applied, pt removed them within ten minutes and became combative and verbally aggressive when attempted to reapply mitts.  Sitter ordered. Family notified. MD Donata ClayVan Trigt called and updated, gave orders for PRN haldol, bilateral soft wrist restraints, and to d/c all narcotics. Pt had not received any narcotics in >12hrs.   As of 02:50 10/19/15, pt has not needed restraints or haldol. Has been sleeping and calm when awakened to draw labs or give IV meds so far. Will continue to monitor.  Peyton Bottomsachel R Riley Papin, RN 2:55 AM 10/19/2015

## 2015-10-19 NOTE — Progress Notes (Signed)
3 Days Post-Op Procedure(s) (LRB): CORONARY ARTERY BYPASS GRAFTING (CABG)  x three, using left internal mammary artery and right leg greater saphenous vein harvested endoscopically (N/A) TRANSESOPHAGEAL ECHOCARDIOGRAM (TEE) (N/A) Subjective: Some confusion and agitation overnight Feels better this AM  Objective: Vital signs in last 24 hours: Temp:  [97.7 F (36.5 C)-98.7 F (37.1 C)] 98 F (36.7 C) (06/15 0756) Pulse Rate:  [62-122] 97 (06/15 0800) Cardiac Rhythm:  [-] Normal sinus rhythm;Supraventricular tachycardia (06/15 0800) Resp:  [10-30] 24 (06/15 0800) BP: (90-128)/(46-78) 121/66 mmHg (06/15 0800) SpO2:  [92 %-100 %] 94 % (06/15 0800) Weight:  [114 lb 3.2 oz (51.8 kg)] 114 lb 3.2 oz (51.8 kg) (06/15 0500)  Hemodynamic parameters for last 24 hours:    Intake/Output from previous day: 06/14 0701 - 06/15 0700 In: 293 [P.O.:240; I.V.:3; IV Piggyback:50] Out: 2310 [Urine:2310] Intake/Output this shift: Total I/O In: -  Out: 25 [Urine:25]  General appearance: alert, cooperative and no distress Neurologic: tremor Heart: slightly iregular (SR + PACs by monitor) Lungs: diminished breath sounds bibasilar Abdomen: normal findings: soft, non-tender Wound: clean and dry with some ecchymosis  Lab Results:  Recent Labs  10/18/15 0357 10/19/15 0415  WBC 9.8 7.9  HGB 8.4* 8.7*  HCT 25.8* 27.2*  PLT 130* 158   BMET:  Recent Labs  10/18/15 2300 10/19/15 0415  NA 133* 134*  K 3.9 4.3  CL 104 103  CO2 24 24  GLUCOSE 105* 98  BUN 23* 22*  CREATININE 1.67* 1.60*  CALCIUM 8.0* 8.0*    PT/INR:  Recent Labs  10/16/15 1225  LABPROT 18.2*  INR 1.51*   ABG    Component Value Date/Time   PHART 7.323* 10/16/2015 1826   HCO3 21.0 10/16/2015 1826   TCO2 22 10/17/2015 1606   ACIDBASEDEF 5.0* 10/16/2015 1826   O2SAT 97.0 10/16/2015 1826   CBG (last 3)   Recent Labs  10/18/15 1730 10/18/15 2138 10/19/15 0752  GLUCAP 94 117* 90    Assessment/Plan: S/P  Procedure(s) (LRB): CORONARY ARTERY BYPASS GRAFTING (CABG)  x three, using left internal mammary artery and right leg greater saphenous vein harvested endoscopically (N/A) TRANSESOPHAGEAL ECHOCARDIOGRAM (TEE) (N/A) -  CV- brief episode of atrial fib last night, PACs this AM- continue metoprolol  RESP_ right pleural effusion looks better on CXR this AM  RENAL- creatinine stable, good UOP  Change lasix to PO  Dc Foley  ENDO- CBG Ok, dc SSI  Deconditioning- continue ambulation   NEURO- "sundowning" - confused and agitated at night, better during daylight   LOS: 13 days    Tonya Payne 10/19/2015

## 2015-10-19 NOTE — [place] FL2 (Signed)
[place] MEDICAID FL2 LEVEL OF CARE SCREENING TOOL     IDENTIFICATION  Patient Name: Tonya AlmSuann Payne Birthdate: 25-May-1934 Sex: female Admission Date (Current Location): 10/06/2015  [facility] [place]   Facility and Address:  [facility] [place], [address], [place], [place] [address]      Provider Number: [id number]  Attending Physician Name and Address:  Loreli SlotSteven C Hendrickson, MD  Relative Name and Phone Number:       Current Level of Care: SNF Recommended Level of Care: Skilled Nursing Facility Prior Approval Number:    Date Approved/Denied:   PASRR Number:  [id number]   Discharge Plan: SNF    Current Diagnoses: Patient Active Problem List   Diagnosis Date Noted  . S/P CABG x 3 10/16/2015  . CAD (coronary artery disease), native coronary artery 10/08/2015  . Congestive dilated cardiomyopathy (HCC) 10/08/2015  . Hyperlipidemia LDL goal <70 10/08/2015  . UTI (urinary tract infection) 10/06/2015  . ST elevation (STEMI) myocardial infarction involving left anterior descending coronary artery (HCC) 10/06/2015    Orientation RESPIRATION BLADDER Height & Weight     Self, Time, Situation, Place  Normal Continent Weight: 114 lb 3.2 oz (51.8 kg) Height:  4' 11.84" (152 cm)  BEHAVIORAL SYMPTOMS/MOOD NEUROLOGICAL BOWEL NUTRITION STATUS   (none)  (none) Continent Diet (Heart)  AMBULATORY STATUS COMMUNICATION OF NEEDS Skin   Extensive Assist Verbally Surgical wounds (Incision Closed: Chest and RT Leg Gauze Daily. )                       Personal Care Assistance Level of Assistance  Bathing, Feeding, Dressing Bathing Assistance: Limited assistance Feeding assistance: Independent Dressing Assistance: Limited assistance     Functional Limitations Info  Sight, Hearing, Speech Sight Info: Adequate Hearing Info: Adequate Speech Info: Adequate    SPECIAL CARE FACTORS FREQUENCY  PT (By licensed PT), OT (By licensed OT)     PT  Frequency: 5/ week OT Frequency: 5/ week            Contractures Contractures Info: Not present    Additional Factors Info  Code Status, Allergies, Psychotropic Code Status Info: FULL Allergies Info: NKDA Psychotropic Info: ALPRAZolam (XANAX) tablet 0.25 mg Dose: 0.25 mg Freq:  Every morning - 10a Route: PO, OLANZapine (ZYPREXA) tablet 5 mg Dose: 5 mg Freq: Daily at bedtime Route: PO,  sertraline (ZOLOFT) tablet 50 mg Dose: 50 mg Freq:  Every morning - 10a Route: PO         Current Medications (10/19/2015):  This is the current hospital active medication list Current Facility-Administered Medications  Medication Dose Route Frequency Provider Last Rate Last Dose  . 0.45 % sodium chloride infusion   Intravenous Continuous PRN Rowe ClackWayne E Gold, PA-C   Stopped at 10/17/15 0900  . 0.9 %  sodium chloride infusion  250 mL Intravenous Continuous Rowe ClackWayne E Gold, PA-C   Stopped at 10/17/15 0800  . 0.9 %  sodium chloride infusion   Intravenous Continuous Rowe ClackWayne E Gold, PA-C 20 mL/hr at 10/18/15 [id number]    . acetaminophen (TYLENOL) tablet 1,000 mg  1,000 mg Oral Q6H Wayne E Gold, PA-C   1,000 mg at 10/19/15 1313   Or  . acetaminophen (TYLENOL) solution 1,000 mg  1,000 mg Per Tube Q6H Wayne E Gold, PA-C      . ALPRAZolam (XANAX) tablet 0.25 mg  0.25 mg Oral q morning - 10a Loreli SlotSteven C Hendrickson, MD   0.25 mg  at 10/19/15 0905  . antiseptic oral rinse (CPC / CETYLPYRIDINIUM CHLORIDE 0.05%) solution 7 mL  7 mL Mouth Rinse BID Loreli Slot, MD   7 mL at 10/17/15 1000  . aspirin EC tablet 325 mg  325 mg Oral Daily Rowe Clack, PA-C   325 mg at 10/19/15 8657   Or  . aspirin chewable tablet 324 mg  324 mg Per Tube Daily Wayne E Gold, PA-C      . atorvastatin (LIPITOR) tablet 80 mg  80 mg Oral q1800 Marykay Lex, MD   80 mg at 10/18/15 1718  . bisacodyl (DULCOLAX) EC tablet 10 mg  10 mg Oral Daily Rowe Clack, PA-C   10 mg at 10/19/15 8469   Or  . bisacodyl (DULCOLAX) suppository 10 mg  10 mg Rectal  Daily Wayne E Gold, PA-C      . docusate sodium (COLACE) capsule 200 mg  200 mg Oral Daily Wayne E Gold, PA-C   200 mg at 10/19/15 0905  . enoxaparin (LOVENOX) injection 30 mg  30 mg Subcutaneous QHS Loreli Slot, MD   30 mg at 10/18/15 2200  . furosemide (LASIX) tablet 40 mg  40 mg Oral Daily Loreli Slot, MD   40 mg at 10/19/15 6295  . haloperidol lactate (HALDOL) injection 1-2 mg  1-2 mg Intravenous Q4H PRN Kerin Perna, MD      . ipratropium-albuterol (DUONEB) 0.5-2.5 (3) MG/3ML nebulizer solution 3 mL  3 mL Nebulization QID PRN Loreli Slot, MD      . lactated ringers infusion   Intravenous Continuous Rowe Clack, PA-C   Stopped at 10/16/15 1700  . lactated ringers infusion   Intravenous Continuous Rowe Clack, PA-C   Stopped at 10/17/15 0900  . metoprolol (LOPRESSOR) injection 2.5-5 mg  2.5-5 mg Intravenous Q2H PRN Rowe Clack, PA-C   5 mg at 10/19/15 0236  . metoprolol tartrate (LOPRESSOR) tablet 25 mg  25 mg Oral BID Loreli Slot, MD   25 mg at 10/19/15 2841   Or  . metoprolol tartrate (LOPRESSOR) 25 mg/10 mL oral suspension 25 mg  25 mg Per Tube BID Loreli Slot, MD      . nitroGLYCERIN 50 mg in dextrose 5 % 250 mL (0.2 mg/mL) infusion  0-100 mcg/min Intravenous Titrated Rowe Clack, PA-C   Stopped at 10/16/15 1605  . OLANZapine (ZYPREXA) tablet 5 mg  5 mg Oral QHS Loreli Slot, MD   5 mg at 10/17/15 2113  . ondansetron (ZOFRAN) injection 4 mg  4 mg Intravenous Q6H PRN Wayne E Gold, PA-C   4 mg at 10/17/15 1024  . pantoprazole (PROTONIX) EC tablet 40 mg  40 mg Oral Daily Rowe Clack, PA-C   40 mg at 10/19/15 0905  . phenylephrine (NEO-SYNEPHRINE) 10 mg in dextrose 5 % 250 mL (0.04 mg/mL) infusion  0-100 mcg/min Intravenous Titrated Rowe Clack, PA-C   Stopped at 10/16/15 1309  . sertraline (ZOLOFT) tablet 50 mg  50 mg Oral q morning - 10a Loreli Slot, MD   50 mg at 10/19/15 0905  . sodium chloride flush (NS) 0.9 % injection  3 mL  3 mL Intravenous Q12H Wayne E Gold, PA-C   3 mL at 10/18/15 2300  . sodium chloride flush (NS) 0.9 % injection 3 mL  3 mL Intravenous PRN Rowe Clack, PA-C       Facility-Administered Medications Ordered in [employer]  Medication Dose Route Frequency Provider Last Rate Last Dose  . bivalirudin (ANGIOMAX) 250 mg in sodium chloride 0.9 % 50 mL (5 mg/mL) infusion  250 mg  Continuous PRN Marykay Lex, MD   Stopped at 10/06/15 1556  . bivalirudin (ANGIOMAX) BOLUS via infusion    PRN Marykay Lex, MD   27.225 mg at 10/06/15 1504  . fentaNYL (SUBLIMAZE) injection    PRN Marykay Lex, MD   25 mcg at 10/06/15 1529  . heparin infusion 2 units/mL in 0.9 % sodium chloride    Continuous PRN Marykay Lex, MD   1,500 mL at 10/06/15 1454  . iopamidol (ISOVUE-370) 76 % injection    PRN Marykay Lex, MD   150 mL at 10/06/15 1554  . labetalol (NORMODYNE,TRANDATE) injection    PRN Marykay Lex, MD   10 mg at 10/06/15 1526  . lidocaine (PF) (XYLOCAINE) 1 % injection    PRN Marykay Lex, MD   2 mL at 10/06/15 1454  . nitroGLYCERIN 1 mg/10 ml (100 mcg/ml) - IR/CATH LAB    PRN Marykay Lex, MD   200 mcg at 10/06/15 1525  . ondansetron (ZOFRAN) injection    PRN Marykay Lex, MD   4 mg at 10/06/15 1453  . Radial Cocktail (Verapamil 2.5 mg, NTG, Lidocaine)    PRN Marykay Lex, MD      . ticagrelor [facility]) tablet    PRN Marykay Lex, MD   180 mg at 10/06/15 1506  . tirofiban (AGGRASTAT) bolus via infusion    PRN Marykay Lex, MD   907.5 mcg at 10/06/15 1558  . tirofiban (AGGRASTAT) infusion 50 mcg/mL 100 mL    Continuous PRN Marykay Lex, MD 3.3 mL/hr at 10/06/15 1606 0.075 mcg/kg/min at 10/06/15 1606     Discharge Medications: Please see discharge summary for a list of discharge medications.  Relevant Imaging Results:  Relevant Lab Results:   Additional Information SSN:[id number]  [employer], LCSW

## 2015-10-19 NOTE — Clinical Social Work Note (Signed)
Clinical Social Work Assessment  Patient Details  Name: Tonya Payne MRN: [id number] Date of Birth: 1934-09-30  Date of referral:  10/19/15               Reason for consult:  Discharge Planning                Permission sought to share information with:  Chartered certified accountant granted to share information::  Yes, Verbal Permission Granted  Name::     Wellsite geologist::  SNFs  Relationship::  Son  Contact Information:     Housing/Transportation Living arrangements for the past 2 months:  [place] of Information:  Patient Patient Interpreter Needed:  None Criminal Activity/Legal Involvement Pertinent to Current Situation/Hospitalization:  No - Comment as needed Significant Relationships:  Adult Children Lives with:    Do you feel safe going back to the place where you live?  Yes Need for family participation in patient care:  No (Coment)  Care giving concerns:  The patient is aggregable for short term rehab at discharge. Patient would like to rebuild her strength to return home.   Social Worker assessment / plan:  CSW met with patient at beside to complete assessment. Patient was resting comfortably in bed. CSW explained PT recommendation for SNF placement. CSW explained SNF search and placement process to the patient and answered her questions. Patient reported her/ his supports. Per patient request CSW called Sonita Michiels. CSW explained SNF search and placement process to the patient and answered his questions. CSW will follow up with bed offers.   Employment status:  Retired Nurse, adult PT Recommendations:  [place] / Referral to community resources:  [place]  Patient/Family's Response to care:  The patient appears happy with the care she is receiving in hospital and is appreciative of CSW assistance.  Patient/Family's Understanding of and Emotional  Response to Diagnosis, Current Treatment, and Prognosis:  The patient has a good understanding of why she was admitted.  She understands the care plan and what she will need post discharge.  Emotional Assessment Appearance:  Appears stated age Attitude/Demeanor/Rapport:   (Patient was welcoming of CSW and appropriate.) Affect (typically observed):  Accepting, Calm, Appropriate Orientation:  Oriented to Self, Oriented to Place, Oriented to  Time, Oriented to Situation Alcohol / Substance use:  Not Applicable Psych involvement (Current and /or in the community):  No (Comment)  Discharge Needs  Concerns to be addressed:  Discharge Planning Concerns Readmission within the last 30 days:  No Current discharge risk:  Physical Impairment Barriers to Discharge:  Continued Medical Work up   [employer], LCSW 10/19/2015, 2:19 PM

## 2015-10-19 NOTE — Progress Notes (Signed)
TCTS BRIEF SICU PROGRESS NOTE  3 Days Post-Op  S/P Procedure(s) (LRB): CORONARY ARTERY BYPASS GRAFTING (CABG)  x three, using left internal mammary artery and right leg greater saphenous vein harvested endoscopically (N/A) TRANSESOPHAGEAL ECHOCARDIOGRAM (TEE) (N/A)   Stable day Mental status reportedly improved NSR w/ stable BP UOP adequate  Plan: Continue current plan  Purcell Nailslarence H Owen, MD 10/19/2015 8:28 PM

## 2015-10-19 NOTE — Progress Notes (Signed)
 2mg  Versed wasted in sink (witnessed by AutolivWilliam Council, Charity fundraiserN) after being drawn in error.Meriel Flavors.  Taiya Nutting R Mahogany Torrance, RN 10/18/2015 22:00

## 2015-10-20 ENCOUNTER — Inpatient Hospital Stay (HOSPITAL_COMMUNITY): Payer: Medicare Other

## 2015-10-20 DIAGNOSIS — I25119 Atherosclerotic heart disease of native coronary artery with unspecified angina pectoris: Secondary | ICD-10-CM

## 2015-10-20 DIAGNOSIS — I480 Paroxysmal atrial fibrillation: Secondary | ICD-10-CM

## 2015-10-20 DIAGNOSIS — Z951 Presence of aortocoronary bypass graft: Secondary | ICD-10-CM

## 2015-10-20 LAB — GLUCOSE, CAPILLARY
GLUCOSE-CAPILLARY: 96 mg/dL (ref 65–99)
Glucose-Capillary: 140 mg/dL — ABNORMAL HIGH (ref 65–99)
Glucose-Capillary: 105 mg/dL — ABNORMAL HIGH (ref 65–99)

## 2015-10-20 LAB — CBC
HEMATOCRIT: 31.4 % — AB (ref 36.0–46.0)
Hemoglobin: 10 g/dL — ABNORMAL LOW (ref 12.0–15.0)
MCH: 29.3 pg (ref 26.0–34.0)
MCHC: 31.8 g/dL (ref 30.0–36.0)
MCV: 92.1 fL (ref 78.0–100.0)
PLATELETS: 243 10*3/uL (ref 150–400)
RBC: 3.41 MIL/uL — ABNORMAL LOW (ref 3.87–5.11)
RDW: 15.4 % (ref 11.5–15.5)
WBC: 9.9 10*3/uL (ref 4.0–10.5)

## 2015-10-20 LAB — BASIC METABOLIC PANEL
Anion gap: 12 (ref 5–15)
BUN: 21 mg/dL — AB (ref 6–20)
CALCIUM: 8.6 mg/dL — AB (ref 8.9–10.3)
CO2: 22 mmol/L (ref 22–32)
CREATININE: 1.5 mg/dL — AB (ref 0.44–1.00)
Chloride: 100 mmol/L — ABNORMAL LOW (ref 101–111)
GFR calc Af Amer: 37 mL/min — ABNORMAL LOW (ref >60)
GFR, EST NON AFRICAN AMERICAN: 32 mL/min — AB (ref >60)
GLUCOSE: 104 mg/dL — AB (ref 65–99)
Potassium: 3.8 mmol/L (ref 3.5–5.1)
Sodium: 134 mmol/L — ABNORMAL LOW (ref 135–145)

## 2015-10-20 MED ORDER — METOPROLOL TARTRATE 50 MG PO TABS
50.0000 mg | ORAL_TABLET | Freq: Two times a day (BID) | ORAL | Status: DC
  Administered 2015-10-20 – 2015-10-23 (×7): 50 mg via ORAL
  Filled 2015-10-20 (×7): qty 1

## 2015-10-20 MED ORDER — ALUM & MAG HYDROXIDE-SIMETH 200-200-20 MG/5ML PO SUSP
15.0000 mL | ORAL | Status: DC | PRN
  Filled 2015-10-20: qty 30

## 2015-10-20 MED ORDER — MAGNESIUM HYDROXIDE 400 MG/5ML PO SUSP
30.0000 mL | Freq: Every day | ORAL | Status: DC | PRN
  Administered 2015-10-29: 30 mL via ORAL
  Filled 2015-10-20 (×3): qty 30

## 2015-10-20 MED ORDER — SODIUM CHLORIDE 0.9% FLUSH: 3.0000 mL | INTRAVENOUS | Status: DC | PRN

## 2015-10-20 MED ORDER — GUAIFENESIN-DM 100-10 MG/5ML PO SYRP
15.0000 mL | ORAL_SOLUTION | ORAL | Status: DC | PRN
Start: 2015-10-20 — End: 2015-10-30

## 2015-10-20 MED ORDER — AMIODARONE IV BOLUS ONLY 150 MG/100ML: 150.0000 mg | Freq: Once | INTRAVENOUS | Status: DC

## 2015-10-20 MED ORDER — SODIUM CHLORIDE 0.9 % IV SOLN: 250.0000 mL | INTRAVENOUS | Status: DC | PRN

## 2015-10-20 MED ORDER — METOPROLOL TARTRATE 25 MG PO TABS
25.0000 mg | ORAL_TABLET | Freq: Three times a day (TID) | ORAL | Status: DC
  Administered 2015-10-20 (×2): 25 mg via ORAL
  Filled 2015-10-20 (×2): qty 1

## 2015-10-20 MED ORDER — MOVING RIGHT ALONG BOOK
Freq: Once | Status: AC
  Administered 2015-10-20: 16:00:00
  Filled 2015-10-20: qty 1

## 2015-10-20 MED ORDER — SODIUM CHLORIDE 0.9% FLUSH
3.0000 mL | Freq: Two times a day (BID) | INTRAVENOUS | Status: DC
  Administered 2015-10-20 – 2015-10-29 (×17): 3 mL via INTRAVENOUS

## 2015-10-20 MED ORDER — AMIODARONE HCL IN DEXTROSE 360-4.14 MG/200ML-% IV SOLN: 30.0000 mg/h | INTRAVENOUS | Status: DC

## 2015-10-20 MED ORDER — METOPROLOL TARTRATE 25 MG/10 ML ORAL SUSPENSION
25.0000 mg | Freq: Three times a day (TID) | ORAL | Status: DC
  Filled 2015-10-20: qty 10

## 2015-10-20 MED ORDER — ATORVASTATIN CALCIUM 40 MG PO TABS
40.0000 mg | ORAL_TABLET | Freq: Every day | ORAL | Status: DC
  Administered 2015-10-20 – 2015-10-29 (×10): 40 mg via ORAL
  Filled 2015-10-20 (×10): qty 1

## 2015-10-20 MED ORDER — POTASSIUM CHLORIDE CRYS ER 20 MEQ PO TBCR
20.0000 meq | EXTENDED_RELEASE_TABLET | Freq: Every day | ORAL | Status: DC
  Administered 2015-10-20 – 2015-10-25 (×6): 20 meq via ORAL
  Filled 2015-10-20 (×6): qty 1

## 2015-10-20 NOTE — Significant Event (Signed)
Patient's daughter in law requesting that RN place a depend on patient to meet her toileting needs, stating that patient gets anxious whenever she has to get up to use the bathroom. Per daughter in law; patient is used to wearing depends at home. RN explained to patient and daughter in law, the importance of preventing infection by not utilizing depends and for patient to continue to call for staff for bathroom needs. Today, patient has been able to call staff using call bell whenever she needs to use the bathroom or whenever she has incontinence episodes.  RN also explained that we do not have depends for patient to use. Daughter in law states she doe not want patient to get "anxious and workup" when she needs to get up to use bathroom or whenever she has incontinence episodes. Per patient's daughter in law, she will be bringing depends tomorrow for patient to wear.

## 2015-10-20 NOTE — Progress Notes (Signed)
4 Days Post-Op Procedure(s) (LRB): CORONARY ARTERY BYPASS GRAFTING (CABG)  x three, using left internal mammary artery and right leg greater saphenous vein harvested endoscopically (N/A) TRANSESOPHAGEAL ECHOCARDIOGRAM (TEE) (N/A) Subjective: No complaints this AM Sundowning was not a severe last night Ambulated 100' with assist this AM- per RN was more steady  Objective: Vital signs in last 24 hours: Temp:  [98.2 F (36.8 C)-99.2 F (37.3 C)] 98.6 F (37 C) (06/16 0700) Pulse Rate:  [69-130] 85 (06/16 0700) Cardiac Rhythm:  [-] Normal sinus rhythm (06/16 0745) Resp:  [15-29] 21 (06/16 0700) BP: (83-150)/(51-93) 131/70 mmHg (06/16 0700) SpO2:  [93 %-100 %] 98 % (06/16 0700) Weight:  [112 lb 3.4 oz (50.9 kg)] 112 lb 3.4 oz (50.9 kg) (06/16 0600)  Hemodynamic parameters for last 24 hours:    Intake/Output from previous day: 06/15 0701 - 06/16 0700 In: 250 [P.O.:240; I.V.:10] Out: 910 [Urine:910] Intake/Output this shift:    General appearance: alert, cooperative and no distress Neurologic: intact Heart: regular rate and rhythm Lungs: diminished breath sounds bibasilar Wound: clean and dry  Lab Results:  Recent Labs  10/19/15 0415 10/20/15 0653  WBC 7.9 9.9  HGB 8.7* 10.0*  HCT 27.2* 31.4*  PLT 158 243   BMET:  Recent Labs  10/19/15 0415 10/20/15 0653  NA 134* 134*  K 4.3 3.8  CL 103 100*  CO2 24 22  GLUCOSE 98 104*  BUN 22* 21*  CREATININE 1.60* 1.50*  CALCIUM 8.0* 8.6*    PT/INR: No results for input(s): LABPROT, INR in the last 72 hours. ABG    Component Value Date/Time   PHART 7.323* 10/16/2015 1826   HCO3 21.0 10/16/2015 1826   TCO2 22 10/17/2015 1606   ACIDBASEDEF 5.0* 10/16/2015 1826   O2SAT 97.0 10/16/2015 1826   CBG (last 3)   Recent Labs  10/19/15 1159 10/19/15 1603 10/19/15 2154  GLUCAP 104* 110* 107*    Assessment/Plan: S/P Procedure(s) (LRB): CORONARY ARTERY BYPASS GRAFTING (CABG)  x three, using left internal mammary artery  and right leg greater saphenous vein harvested endoscopically (N/A) TRANSESOPHAGEAL ECHOCARDIOGRAM (TEE) (N/A) -  CV- had transient atrial fib this AM- now in SR, converted back into SR before amiodarone given  Has persistent T wave changes laterally postop  QTc still long but < 500 and trending down  RESP- continue IS for atelectasis  RENAL- creatinine better and diuresing well, c/w resolving ATN/ AKI  ENDO- CBG well controlled  NEURO- less confused, tremor better  DECONDITIONING- primary issue at present, continue to mobilize  SCD + enoxaparin for DVT prophylaxis  [facility]   LOS: 14 days    Loreli SlotSteven C Madline Oesterling 10/20/2015

## 2015-10-20 NOTE — Clinical Social Work Note (Signed)
CSW met with patient. CSW presented bed offers. CSW called patient's son and informed him of bed offers. Patient will let CSW know preferred  SNF.  CSW continuing to follow for discharge needs.   [employer], LCSW [phone]

## 2015-10-20 NOTE — Significant Event (Signed)
Patient transferred safely to 2W22, taken via wheelchair. VS stable prior to being transfer. Report given to receiving RN Lynden Angathy. Patient's daughter-in-law at bedside. Patient's personal belongings at bedside in 2W22-(cell-phone). Patient settled in bed prior to RN leaving.

## 2015-10-20 NOTE — Care Management Important Message (Signed)
Important Message  Patient Details  Name: Tonya Payne MRN: [id number] Date of Birth: Jan 06, 1935   Medicare Important Message Given:  Yes    Kyla BalzarineShealy, Alyannah Sanks Abena 10/20/2015, 12:07 PM

## 2015-10-20 NOTE — Progress Notes (Signed)
Physical Therapy Treatment Patient Details Name: Tonya Payne MRN: [id number] DOB: [birth year] Today's Date: 10/20/2015    History of Present Illness Pt admit with STEMI.  Pt had CABG x3.     PT Comments    Patient seen for ambulation and mobility progression. Patient ambulated in hall but continues to require increased assist and multiple seated rest breaks. Overall distance limited by dizziness and fatigue. At this time continue to recommend post acute rehabilitation.  OF NOTE: HR stable 90s, BP 130s/60s.    Follow Up Recommendations  SNF;Supervision/Assistance - 24 hour     Equipment Recommendations  Other (comment) (TBA)    Recommendations for Other Services       Precautions / Restrictions Precautions Precautions: Fall;Sternal Restrictions Weight Bearing Restrictions: Yes (sternal precautions) Other Position/Activity Restrictions: sternal--pt needs VCs to follow sternal precautions    Mobility  Bed Mobility               General bed mobility comments: pt was in chair  Transfers Overall transfer level: Needs assistance Equipment used: Rolling walker (2 wheeled) Transfers: Sit to/from Stand Sit to Stand: Mod assist;+2 safety/equipment         General transfer comment: performed x4 during session (VCs for techniquie with sternal px compliance)  Ambulation/Gait Ambulation/Gait assistance: Min assist;+2 physical assistance Ambulation Distance (Feet): 140 Feet Assistive device: Rolling walker (2 wheeled) Gait Pattern/deviations: Step-through pattern;Decreased stride length;Shuffle;Trunk flexed;Narrow base of support Gait velocity: decreased Gait velocity interpretation: Below normal speed for age/gender General Gait Details: patient continues with unsteady gait, flexed posture max cues for upright positioning. 3 seated rest breaks with increased nausea as we progressed. HR stable 90s   Stairs            Wheelchair Mobility    Modified Rankin  (Stroke Patients Only)       Balance   Sitting-balance support: No upper extremity supported Sitting balance-[name]: Poor Sitting balance - Comments: unsteady with posterior list when sitting edge of chair   Standing balance support: Bilateral upper extremity supported Standing balance-[name]: Poor                      Cognition Arousal/Alertness: Awake/alert Behavior During Therapy: Flat affect Overall Cognitive Status: Impaired/Different from baseline Area of Impairment: Following commands;Safety/judgement;Problem solving       Following Commands: Follows one step commands with increased time Safety/Judgement: Decreased awareness of safety   Problem Solving: Difficulty sequencing;Requires verbal cues;Requires tactile cues;Decreased initiation      Exercises      General Comments        Pertinent Vitals/Pain Pain Assessment: Faces Faces Pain Scale: Hurts little more Pain Location: chest incision Pain Descriptors / Indicators: Grimacing;Discomfort Pain Intervention(s): Monitored during session    Home Living                      Prior Function            PT Goals (current goals can now be found in the care plan section) Acute Rehab PT Goals Patient Stated Goal: to get better PT Goal Formulation: With patient Time For Goal Achievement: 11/01/15 Potential to Achieve Goals: Good Progress towards PT goals: Progressing toward goals    Frequency  Min 3X/week    PT Plan Current plan remains appropriate    Co-evaluation             End of Session Equipment Utilized During Treatment: Gait belt;Oxygen Activity Tolerance: Patient  limited by fatigue Patient left: in chair;with call bell/phone within reach     Time: [id number] PT Time Calculation (min) (ACUTE ONLY): 19 min  Charges:  $Gait Training: 8-22 mins                    G CodesFabio Payne:      Tonya Payne 10/20/2015, 3:51 PM Tonya Payne, PT DPT  [phone]

## 2015-10-20 NOTE — Progress Notes (Signed)
Patient Name: Tonya Payne Date of Encounter: 10/20/2015  Principal Problem:   ST elevation (STEMI) myocardial infarction involving left anterior descending coronary artery (HCC) Active Problems:   CAD (coronary artery disease), native coronary artery   Congestive dilated cardiomyopathy (HCC)   Hyperlipidemia LDL goal <70   S/P CABG x 3   Length of Stay: 14  SUBJECTIVE  The patient feels tired, some pain at sternotomy site.   CURRENT MEDS . acetaminophen  1,000 mg Oral Q6H   Or  . acetaminophen (TYLENOL) oral liquid 160 mg/5 mL  1,000 mg Per Tube Q6H  . ALPRAZolam  0.25 mg Oral q morning - 10a  . antiseptic oral rinse  7 mL Mouth Rinse BID  . aspirin EC  325 mg Oral Daily   Or  . aspirin  324 mg Per Tube Daily  . atorvastatin  40 mg Oral q1800  . bisacodyl  10 mg Oral Daily   Or  . bisacodyl  10 mg Rectal Daily  . docusate sodium  200 mg Oral Daily  . enoxaparin (LOVENOX) injection  30 mg Subcutaneous QHS  . furosemide  40 mg Oral Daily  . metoprolol tartrate  25 mg Oral TID   Or  . metoprolol tartrate  25 mg Per Tube TID  . OLANZapine  5 mg Oral QHS  . pantoprazole  40 mg Oral Daily  . potassium chloride  20 mEq Oral Daily  . sertraline  50 mg Oral q morning - 10a  . sodium chloride flush  3 mL Intravenous Q12H   OBJECTIVE  Filed Vitals:   10/20/15 1200 10/20/15 1300 10/20/15 1400 10/20/15 1500  BP: 139/63  117/70 122/84  Pulse: 85 88 90 86  Temp: 98.6 F (37 C)     TempSrc: Oral     Resp: Height:      Weight:      SpO2: 94% 94% 96% 97%    Intake/Output Summary (Last 24 hours) at 10/20/15 1546 Last data filed at 10/20/15 1045  Gross per 24 hour  Intake    250 ml  Output    775 ml  Net   -525 ml   Filed Weights   10/18/15 0438 10/19/15 0500 10/20/15 0600  Weight: 119 lb 1.6 oz (54.023 kg) 114 lb 3.2 oz (51.8 kg) 112 lb 3.4 oz (50.9 kg)   PHYSICAL EXAM  General: Pleasant, NAD. Neuro: Alert and oriented X 3. Moves all extremities  spontaneously. Psych: Normal affect. HEENT:  Normal  Neck: Supple without bruits or JVD. Lungs:  Resp regular and unlabored, mild crackles at basis, Healing sternotomy site Heart: RRR no s3, s4, or murmurs. Abdomen: Soft, non-tender, non-distended, BS + x 4.  Extremities: No clubbing, cyanosis or edema. DP/PT/Radials 2+ and equal bilaterally.  Accessory Clinical Findings  CBC  Recent Labs  10/19/15 0415 10/20/15 0653  WBC 7.9 9.9  HGB 8.7* 10.0*  HCT 27.2* 31.4*  MCV 91.9 92.1  PLT 158 243   Basic Metabolic Panel  Recent Labs  10/17/15 1605  10/19/15 0415 10/20/15 0653  NA  --   < > 134* 134*  K  --   < > 4.3 3.8  CL  --   < > 103 100*  CO2  --   < > 24 22  GLUCOSE  --   < > 98 104*  BUN  --   < > 22* 21*  CREATININE 1.60*  < > 1.60* 1.50*  CALCIUM  --   < > 8.0* 8.6*  MG 2.2  --   --   --   < > = values in this interval not displayed. Liver Function Tests  Radiology/Studies  Dg Chest Port 1 View  10/19/2015  CLINICAL DATA:  Status post CABG EXAM: PORTABLE CHEST 1 VIEW COMPARISON:  Chest radiograph from one day prior. FINDINGS: Right internal jugular central venous sheath terminates in the right brachiocephalic vein. Sternotomy wires appear aligned and intact. CABG clips overlie the mediastinum. Epicardial pacer leads overlie the heart. Partially visualized surgical hardware from ACDF overlies the lower cervical spine. Stable cardiomediastinal silhouette with mild cardiomegaly. No pneumothorax. Small bilateral pleural effusions, slightly decreased bilaterally. Stable mild pulmonary edema. Patchy bibasilar lung opacities appear slightly decreased bilaterally. IMPRESSION: 1. Stable mild cardiomegaly and mild pulmonary edema . 2. Small bilateral pleural effusions, slightly decreased bilaterally. 3. Patchy bibasilar lung opacities, slightly decreased bilaterally, favor atelectasis. Electronically Signed   By: Delbert PhenixJason A Poff M.D.   On: 10/19/2015 08:13   TELE: SR, a-fib this  am    ASSESSMENT AND PLAN  1. CAD, s/p CABG on 10/16/15 - continue ASA, metoprolol, atorvastatin  2. PAF - transient, less than 2 hours, increase metoprolol 25 mg po TID to 50 mg po BID  3. Prolonged QT - now QTc 490 ms, improved, increase metoprolol   4. Acute diastolic CHF - post op, agree with lasix 40 mg po daily, Crea stable at 1.5   Signed, Tobias AlexanderKatarina Adi Doro MD, [facility] System[facility]ACC 10/20/2015

## 2015-10-20 NOTE — Progress Notes (Addendum)
Pt went into a.fib HR sustaining 120s-150s. After 5 mg PRN Lopressor HR 80s-110s, still in a.fib BP 138/74. Dr. Cornelius Moraswen made aware and orders for amio bolus and infusion at 30 mg/hr ordered. Will continue to monitor. 6/16, 0430  Prior to administering amio gtt, pt converted back to sinus rhythm and has remained in sinus rhythm 70s-80s from 0500-0700. Will hold off on starting amio gtt and defer to MD during AM rounds. Will continue to monitor and update day shift RN. 6/16 0700

## 2015-10-20 NOTE — Progress Notes (Signed)
PT Cancellation Note  Patient Details Name: Julio AlmSuann Krenz MRN: [id number] DOB: 01/24/1935   Cancelled Treatment:    Reason Eval/Treat Not Completed: Fatigue/lethargy limiting ability to participate (will reattempt as time allows)   Fabio AsaWerner, Keyonda Bickle J 10/20/2015, 11:29 AM Charlotte Crumbevon Florean Hoobler, PT DPT  [phone]

## 2015-10-21 ENCOUNTER — Other Ambulatory Visit: Payer: Self-pay

## 2015-10-21 ENCOUNTER — Inpatient Hospital Stay (HOSPITAL_COMMUNITY): Payer: Medicare Other

## 2015-10-21 LAB — GLUCOSE, CAPILLARY
GLUCOSE-CAPILLARY: 99 mg/dL (ref 65–99)
GLUCOSE-CAPILLARY: 92 mg/dL (ref 65–99)
GLUCOSE-CAPILLARY: 124 mg/dL — AB (ref 65–99)

## 2015-10-21 LAB — BASIC METABOLIC PANEL
ANION GAP: 13 (ref 5–15)
BUN: 21 mg/dL — ABNORMAL HIGH (ref 6–20)
CO2: 22 mmol/L (ref 22–32)
Calcium: 8.9 mg/dL (ref 8.9–10.3)
Chloride: 97 mmol/L — ABNORMAL LOW (ref 101–111)
Creatinine, Ser: 1.45 mg/dL — ABNORMAL HIGH (ref 0.44–1.00)
GFR, EST AFRICAN AMERICAN: 39 mL/min — AB (ref >60)
GFR, EST NON AFRICAN AMERICAN: 33 mL/min — AB (ref >60)
GLUCOSE: 112 mg/dL — AB (ref 65–99)
POTASSIUM: 3.8 mmol/L (ref 3.5–5.1)
SODIUM: 132 mmol/L — AB (ref 135–145)

## 2015-10-21 LAB — CBC
HCT: 33.8 % — ABNORMAL LOW (ref 36.0–46.0)
Hemoglobin: 10.7 g/dL — ABNORMAL LOW (ref 12.0–15.0)
MCH: 28.8 pg (ref 26.0–34.0)
MCHC: 31.7 g/dL (ref 30.0–36.0)
MCV: 91.1 fL (ref 78.0–100.0)
PLATELETS: 277 10*3/uL (ref 150–400)
RBC: 3.71 MIL/uL — AB (ref 3.87–5.11)
RDW: 15 % (ref 11.5–15.5)
WBC: 9.4 10*3/uL (ref 4.0–10.5)

## 2015-10-21 MED ORDER — ALPRAZOLAM 0.25 MG PO TABS: 0.2500 mg | ORAL_TABLET | Freq: Every morning | ORAL | Status: DC

## 2015-10-21 MED ORDER — ALPRAZOLAM 0.25 MG PO TABS
0.2500 mg | ORAL_TABLET | Freq: Every day | ORAL | Status: DC
  Administered 2015-10-21 – 2015-10-29 (×10): 0.25 mg via ORAL
  Filled 2015-10-21 (×9): qty 1

## 2015-10-21 MED ORDER — AMLODIPINE BESYLATE 5 MG PO TABS
5.0000 mg | ORAL_TABLET | Freq: Every day | ORAL | Status: DC
  Administered 2015-10-21 – 2015-10-23 (×3): 5 mg via ORAL
  Filled 2015-10-21 (×3): qty 1

## 2015-10-21 MED ORDER — ACETAMINOPHEN 325 MG PO TABS
650.0000 mg | ORAL_TABLET | Freq: Four times a day (QID) | ORAL | Status: DC | PRN
  Administered 2015-10-21 – 2015-10-30 (×9): 650 mg via ORAL
  Filled 2015-10-21 (×9): qty 2

## 2015-10-21 MED ORDER — AMIODARONE IV BOLUS ONLY 150 MG/100ML
150.0000 mg | Freq: Once | INTRAVENOUS | Status: AC
  Administered 2015-10-21: 150 mg via INTRAVENOUS
  Filled 2015-10-21: qty 100

## 2015-10-21 MED ORDER — AMIODARONE HCL 200 MG PO TABS
400.0000 mg | ORAL_TABLET | Freq: Every day | ORAL | Status: DC
  Administered 2015-10-21: 400 mg via ORAL
  Filled 2015-10-21: qty 2

## 2015-10-21 NOTE — Clinical Social Work Placement (Signed)
   CLINICAL SOCIAL WORK PLACEMENT  NOTE  Date:  10/21/2015  Patient Details  Name: Tonya Payne MRN: [id number] Date of Birth: [birth year]  Clinical Social Work is seeking post-discharge placement for this patient at the Skilled  Nursing Facility level of care (*CSW will initial, date and re-position this form in  chart as items are completed):  Yes   Patient/family provided with [facility] Clinical Social Work Department's list of facilities offering this level of care within the geographic area requested by the patient (or if unable, by the patient's family).  Yes   Patient/family informed of their freedom to choose among providers that offer the needed level of care, that participate in Medicare, Medicaid or managed care program needed by the patient, have an available bed and are willing to accept the patient.  Yes   Patient/family informed of 's ownership interest in [facility] and [facility], as well as of the fact that they are under no obligation to receive care at these facilities.  PASRR submitted to EDS on 10/19/15     PASRR number received on 10/19/15     Existing PASRR number confirmed on       FL2 transmitted to all facilities in geographic area requested by pt/family on 10/19/15     FL2 transmitted to all facilities within larger geographic area on       Patient informed that his/her managed care company has contracts with or will negotiate with certain facilities, including the following:        Yes   Patient/family informed of bed offers received.  Patient chooses bed at       Physician recommends and patient chooses bed at      Patient to be transferred to   on  .  Patient to be transferred to facility by       Patient family notified on   of transfer.  Name of family member notified:        PHYSICIAN       Additional Comment:    _______________________________________________ Darylene Pricerowder, Tiegan Jambor T, LCSW 10/21/2015, 4:17 PM

## 2015-10-21 NOTE — Progress Notes (Signed)
CARDIAC REHAB PHASE I   PRE:  Rate/Rhythm: 85  BP:  Sitting: 148/78     SaO2: 94% ra  MODE:  Ambulation: 60 ft   POST:  Rate/Rhythm: 103  BP:  Sitting: 106/70 sitting on edge of bed                         Lying in bed: 143/78     SaO2: 97% ra  9:30am-10:00am Patient ambulated with the assistance of two people, gait belt, and Rolator. Patient complained of 5/10 incisional pain, leg weakness, dizziness, and nausea. Needed to take two seated rest breaks. Had a difficult time getting out of bed and getting back into bed. States that she feels worse than normal. Expressed the importance of getting more walking time in to her and nurse. Patient was placed back in bed with call bell in reach.   Tonya ListerMolly M Marycarmen Hagey, MS 10/21/2015 9:54 AM

## 2015-10-21 NOTE — Progress Notes (Addendum)
      [address]       [place] [id number]             [phone]      5 Days Post-Op Procedure(s) (LRB): CORONARY ARTERY BYPASS GRAFTING (CABG)  x three, using left internal mammary artery and right leg greater saphenous vein harvested endoscopically (N/A) TRANSESOPHAGEAL ECHOCARDIOGRAM (TEE) (N/A)   Subjective:  [name] has no new complaints.  There is nursing documentation about daughter in law not wanting patient to have to get up and go to the bathroom and she is requesting the patient use depends.  I encouraged the patient to try and use bedside commode with assistance whenever possible.  She states she cant she is too weak she just had heart surgery.  Objective: Vital signs in last 24 hours: Temp:  [98.1 F (36.7 C)-98.6 F (37 C)] 98.1 F (36.7 C) (06/16 2002) Pulse Rate:  [84-97] 87 (06/16 2002) Cardiac Rhythm:  [-] Normal sinus rhythm (06/16 1930) Resp:  [18-22] 20 (06/16 2002) BP: (97-148)/(61-86) 148/61 mmHg (06/16 2002) SpO2:  [93 %-99 %] 93 % (06/16 2002) Weight:  [108 lb 12.8 oz (49.351 kg)] 108 lb 12.8 oz (49.351 kg) (06/16 2002)  Intake/Output from previous day: 06/16 0701 - 06/17 0700 In: 360 [P.O.:360] Out: 550 [Urine:550]  General appearance: alert, cooperative and no distress Heart: regular rate and rhythm Lungs: clear to auscultation bilaterally Abdomen: soft, non-tender; bowel sounds normal; no masses,  no organomegaly Extremities: edema trace Wound: clean and dry  Lab Results:  Recent Labs  10/20/15 0653 10/21/15 0300  WBC 9.9 9.4  HGB 10.0* 10.7*  HCT 31.4* 33.8*  PLT 243 277   BMET:  Recent Labs  10/20/15 0653 10/21/15 0300  NA 134* 132*  K 3.8 3.8  CL 100* 97*  CO2 22 22  GLUCOSE 104* 112*  BUN 21* 21*  CREATININE 1.50* 1.45*  CALCIUM 8.6* 8.9    PT/INR: No results for input(s): LABPROT, INR in the last 72 hours. ABG    Component Value Date/Time   PHART 7.323* 10/16/2015 1826   HCO3 21.0  10/16/2015 1826   TCO2 22 10/17/2015 1606   ACIDBASEDEF 5.0* 10/16/2015 1826   O2SAT 97.0 10/16/2015 1826   CBG (last 3)   Recent Labs  10/20/15 1726 10/20/15 2108 10/21/15 0638  GLUCAP 140* 105* 99    Assessment/Plan: S/P Procedure(s) (LRB): CORONARY ARTERY BYPASS GRAFTING (CABG)  x three, using left internal mammary artery and right leg greater saphenous vein harvested endoscopically (N/A) TRANSESOPHAGEAL ECHOCARDIOGRAM (TEE) (N/A)  1. CV- PAF, currently NSR- continue Lopressor, dose increase by Dr. Delton SeeNelson, mild HTN- will start low dose Norvasc as creatinine is elevated 2. Pulm- no acute issues, off oxygen, continue IS 3. Renal- creatinine is stable at 1.45, weight is trending down, no LE edema, continue Lasix 4. Deconditioning- patient will require SNF at discharge, will require a lot of encouragement to move and ambulate 5. Dispo- patient stable, maintaining NSR, add Norvasc for BP, continue current care   LOS: 15 days    Lowella DandyBARRETT, ERIN 10/21/2015   Chart reviewed, patient examined, agree with above. Very slow progress due to deconditioning with weakness and dizziness. Hemodynamics have been stable.

## 2015-10-22 LAB — GLUCOSE, CAPILLARY
Glucose-Capillary: 97 mg/dL (ref 65–99)
Glucose-Capillary: 133 mg/dL — ABNORMAL HIGH (ref 65–99)

## 2015-10-22 MED ORDER — AMIODARONE HCL 200 MG PO TABS
200.0000 mg | ORAL_TABLET | Freq: Every day | ORAL | Status: DC
  Administered 2015-10-22 – 2015-10-30 (×9): 200 mg via ORAL
  Filled 2015-10-22 (×9): qty 1

## 2015-10-22 MED ORDER — WHITE PETROLATUM GEL: Status: DC | PRN

## 2015-10-22 NOTE — Progress Notes (Signed)
Pt. Refused to walk 

## 2015-10-22 NOTE — Progress Notes (Addendum)
      [address]       [place] [id number]             [phone]      6 Days Post-Op Procedure(s) (LRB): CORONARY ARTERY BYPASS GRAFTING (CABG)  x three, using left internal mammary artery and right leg greater saphenous vein harvested endoscopically (N/A) TRANSESOPHAGEAL ECHOCARDIOGRAM (TEE) (N/A)   Subjective:  [name] complains of dry mouth.  She spilled her water and states she cant open the bottle on her bedside table.  She had rapid Atrial Fibrillation last night.  Objective: Vital signs in last 24 hours: Temp:  [98.1 F (36.7 C)-98.7 F (37.1 C)] 98.5 F (36.9 C) (06/18 0417) Pulse Rate:  [80-84] 80 (06/18 0417) Cardiac Rhythm:  [-] Normal sinus rhythm (06/18 0706) Resp:  [16-19] 16 (06/18 0417) BP: (133-145)/(67-76) 133/67 mmHg (06/18 0417) SpO2:  [94 %-95 %] 94 % (06/18 0417) Weight:  [110 lb 3.7 oz (50.001 kg)] 110 lb 3.7 oz (50.001 kg) (06/18 0600)  Intake/Output from previous day: 06/17 0701 - 06/18 0700 In: 720 [P.O.:720] Out: 250 [Urine:250]  General appearance: alert, cooperative and no distress Heart: regular rate and rhythm Lungs: clear to auscultation bilaterally Abdomen: soft, non-tender; bowel sounds normal; no masses,  no organomegaly Extremities: edema minimal Wound: clean and dry  Lab Results:  Recent Labs  10/20/15 0653 10/21/15 0300  WBC 9.9 9.4  HGB 10.0* 10.7*  HCT 31.4* 33.8*  PLT 243 277   BMET:  Recent Labs  10/20/15 0653 10/21/15 0300  NA 134* 132*  K 3.8 3.8  CL 100* 97*  CO2 22 22  GLUCOSE 104* 112*  BUN 21* 21*  CREATININE 1.50* 1.45*  CALCIUM 8.6* 8.9    PT/INR: No results for input(s): LABPROT, INR in the last 72 hours. ABG    Component Value Date/Time   PHART 7.323* 10/16/2015 1826   HCO3 21.0 10/16/2015 1826   TCO2 22 10/17/2015 1606   ACIDBASEDEF 5.0* 10/16/2015 1826   O2SAT 97.0 10/16/2015 1826   CBG (last 3)   Recent Labs  10/21/15 1153 10/21/15 1705 10/22/15 0559    GLUCAP 92 124* 97    Assessment/Plan: S/P Procedure(s) (LRB): CORONARY ARTERY BYPASS GRAFTING (CABG)  x three, using left internal mammary artery and right leg greater saphenous vein harvested endoscopically (N/A) TRANSESOPHAGEAL ECHOCARDIOGRAM (TEE) (N/A)  1. CV- Rapid A. Fib overnight, treated with bolus, currently NSR- will continue Lopressor, add Amiodarone 200 mg daily QTC is mildly prolonged at 480... Will monitor if rises will need to stop Amiodarone, continue Norvasc 2. Pulm- no acute issues, continue IS 3. Renal- mildly elevated, not checked today, weight is mildly above baseline, continue Lasix 4. Deconditioning- needs SNF at discharge, continues to be dependent on nursing staff, walked yesterday.... Very short distances with mod assist 5. Dispo- patient stable, rapid A. Fib last night, continue Lopressor, Amiodarone.. Watch Qtc, not ready for SNF likely middle of the week   LOS: 16 days    BARRETT, ERIN 10/22/2015   Chart reviewed, patient examined, agree with above. She says she can't sleep and wants a sleeping pill but is getting Xanax at hs as well as Zyprexa. Getting Zoloft in the morning. At 81 years old I would not give her anything else.

## 2015-10-22 NOTE — Progress Notes (Signed)
Pt was in the bathroom became unresponsive for about  5 mins bp was 126/53 hr. Stayed in the 90's paged the doctor he gave no change in orders  And said " Just watch the pt."

## 2015-10-23 ENCOUNTER — Encounter (HOSPITAL_COMMUNITY): Payer: Self-pay | Admitting: *Deleted

## 2015-10-23 ENCOUNTER — Inpatient Hospital Stay (HOSPITAL_COMMUNITY): Payer: Medicare Other

## 2015-10-23 DIAGNOSIS — I714 Abdominal aortic aneurysm, without rupture: Secondary | ICD-10-CM

## 2015-10-23 LAB — BASIC METABOLIC PANEL
ANION GAP: 13 (ref 5–15)
BUN: 23 mg/dL — AB (ref 6–20)
CHLORIDE: 94 mmol/L — AB (ref 101–111)
CO2: 24 mmol/L (ref 22–32)
Calcium: 8.9 mg/dL (ref 8.9–10.3)
Creatinine, Ser: 1.5 mg/dL — ABNORMAL HIGH (ref 0.44–1.00)
GFR calc Af Amer: 37 mL/min — ABNORMAL LOW (ref >60)
GFR, EST NON AFRICAN AMERICAN: 32 mL/min — AB (ref >60)
GLUCOSE: 100 mg/dL — AB (ref 65–99)
POTASSIUM: 3.6 mmol/L (ref 3.5–5.1)
Sodium: 131 mmol/L — ABNORMAL LOW (ref 135–145)

## 2015-10-23 MED ORDER — SODIUM CHLORIDE 0.45 % IV SOLN
INTRAVENOUS | Status: AC
  Administered 2015-10-23 – 2015-10-26 (×4): via INTRAVENOUS

## 2015-10-23 MED ORDER — IOPAMIDOL (ISOVUE-370) INJECTION 76%
INTRAVENOUS | Status: AC
  Administered 2015-10-23: 75 mL
  Filled 2015-10-23: qty 100

## 2015-10-23 MED ORDER — ENSURE ENLIVE PO LIQD
237.0000 mL | Freq: Two times a day (BID) | ORAL | Status: DC
  Administered 2015-10-24 – 2015-10-26 (×5): 237 mL via ORAL

## 2015-10-23 MED ORDER — TRAMADOL HCL 50 MG PO TABS
50.0000 mg | ORAL_TABLET | Freq: Two times a day (BID) | ORAL | Status: DC | PRN
Start: 2015-10-23 — End: 2015-10-30
  Administered 2015-10-23 – 2015-10-30 (×11): 50 mg via ORAL
  Filled 2015-10-23 (×11): qty 1

## 2015-10-23 NOTE — Progress Notes (Signed)
CARDIAC REHAB PHASE I   PRE:  Rate/Rhythm: 86 SR  BP:  Sitting: 127/67        SaO2: 93 RA  MODE:  Ambulation: 80 ft   POST:  Rate/Rhythm: 102 ST  BP:  Sitting: 103/90         SaO2: 97 RA  Pt in bed, c/o abdominal pain, sternal pain. Pt states she only walked once this weekend (with cardiac rehab on Saturday). Pt required significant assistance from lying to sitting position, however, stood with minimal assistance, a little wobbly upon standing. Pt ambulated 80 ft on RA, rollator, gait belt, assist x2, mildly unsteady gait at times, tolerated fair. Pt c/o weakness in her legs, chest soreness, neck aching, mild DOE, and dizziness, standing rest x1, followed by two seated rest breaks. Encouraged additional ambulation x2 today. Pt to bed after walk for transport to CT, call bell within reach. Will follow as x2 for safety.   [phone] Joylene GrapesEmily C Eliabeth Shoff, RN, BSN 10/23/2015 8:32 AM

## 2015-10-23 NOTE — Progress Notes (Signed)
Utilization review completed.  

## 2015-10-23 NOTE — Consult Note (Signed)
Referring Physician: Hendrickson MD Patient name: Tonya AlmSuann Sass MRN: [id number] DOB: [birth year] Sex: female  REASON FOR CONSULT: symptomatic AAA  HPI: Tonya Payne is a 81 y.o. female, with known AAA.  She has now had 3 days of vague abdominal pain and was Dorris Fetchthought to be more tender on exam today.  She denies back pain.  She has had no hypotension.  She underwent CABG about 7 days ago and is currently very deconditioned.  She is eating some and does not complain of pain with eating.  Other medical problems include COPD, CAD hyperlipidemia all of which are currently stable.  Past Medical History  Diagnosis Date  . COPD (chronic obstructive pulmonary disease) (HCC)   . Anxiety   . Depression   . TIA (transient ischemic attack)   . IBS (irritable bowel syndrome)   . Oral thrush   . CAD (coronary artery disease), native coronary artery 10/08/2015  . Congestive dilated cardiomyopathy (HCC) 10/08/2015  . Hyperlipidemia LDL goal <70 10/08/2015   Past Surgical History  Procedure Laterality Date  . Back surgery    . Neck surgery    . Colectomy    . Cardiac catheterization N/A 10/06/2015    Procedure: Left Heart Cath and Coronary Angiography;  Surgeon: Marykay Lexavid W Harding, MD;  [facility];  Service: Cardiovascular;  Laterality: N/A;  . Cardiac catheterization N/A 10/06/2015    Procedure: Coronary Balloon Angioplasty;  Surgeon: Marykay Lexavid W Harding, MD;  [facility];  Service: Cardiovascular;  Laterality: N/A;  . Coronary artery bypass graft N/A 10/16/2015    Procedure: CORONARY ARTERY BYPASS GRAFTING (CABG)  x three, using left internal mammary artery and right leg greater saphenous vein harvested endoscopically;  Surgeon: Loreli SlotSteven C Hendrickson, MD;  [facility];  Service: Open Heart Surgery;  Laterality: N/A;  . Tee without cardioversion N/A 10/16/2015    Procedure: TRANSESOPHAGEAL ECHOCARDIOGRAM (TEE);  Surgeon: Loreli SlotSteven C Hendrickson, MD;  [facility];  Service: Open  Heart Surgery;  Laterality: N/A;    Family History  Problem Relation Age of Onset  . Stroke Mother     dead  . Clotting disorder Father     dead  bone marrow disease    SOCIAL HISTORY: Social History   Social History  . Marital Status: Single    Spouse Name: N/A  . Number of Children: N/A  . Years of Education: N/A   Occupational History  . Not on file.   Social History Main Topics  . Smoking status: Current Every Day Smoker -- 0.50 packs/day    Types: Cigarettes  . Smokeless tobacco: Never Used  . Alcohol Use: 1.2 oz/week    2 Glasses of wine per week  . Drug Use: No  . Sexual Activity: No   Other Topics Concern  . Not on file   Social History Narrative    No Known Allergies  Current Facility-Administered Medications  Medication Dose Route Frequency Provider Last Rate Last Dose  . 0.45 % sodium chloride infusion   Intravenous Continuous Loreli SlotSteven C Hendrickson, MD      . 0.9 %  sodium chloride infusion  250 mL Intravenous PRN Loreli SlotSteven C Hendrickson, MD      . acetaminophen (TYLENOL) tablet 650 mg  650 mg Oral Q6H PRN Alleen BorneBryan K Bartle, MD   650 mg at 10/22/15 2047  . ALPRAZolam Prudy Feeler(XANAX) tablet 0.25 mg  0.25 mg Oral QHS Loreli SlotSteven C Hendrickson, MD   0.25 mg at 10/22/15 2129  .  alum & mag hydroxide-simeth (MAALOX/MYLANTA) 200-200-20 MG/5ML suspension 15-30 mL  15-30 mL Oral Q4H PRN Loreli Slot, MD      . amiodarone (PACERONE) tablet 200 mg  200 mg Oral Daily Erin R Barrett, PA-C   200 mg at 10/23/15 0939  . amLODipine (NORVASC) tablet 5 mg  5 mg Oral Daily Erin R Barrett, PA-C   5 mg at 10/23/15 0938  . aspirin EC tablet 325 mg  325 mg Oral Daily Rowe Clack, PA-C   325 mg at 10/22/15 1043   Or  . aspirin chewable tablet 324 mg  324 mg Per Tube Daily Wayne E Gold, PA-C      . atorvastatin (LIPITOR) tablet 40 mg  40 mg Oral q1800 Loreli Slot, MD   40 mg at 10/22/15 1800  . bisacodyl (DULCOLAX) EC tablet 10 mg  10 mg Oral Daily Rowe Clack, PA-C   10 mg at  10/22/15 1042   Or  . bisacodyl (DULCOLAX) suppository 10 mg  10 mg Rectal Daily Wayne E Gold, PA-C      . docusate sodium (COLACE) capsule 200 mg  200 mg Oral Daily Wayne E Gold, PA-C   200 mg at 10/23/15 0939  . enoxaparin (LOVENOX) injection 30 mg  30 mg Subcutaneous QHS Loreli Slot, MD   30 mg at 10/22/15 2130  . furosemide (LASIX) tablet 40 mg  40 mg Oral Daily Loreli Slot, MD   40 mg at 10/23/15 0939  . guaiFENesin-dextromethorphan (ROBITUSSIN DM) 100-10 MG/5ML syrup 15 mL  15 mL Oral Q4H PRN Loreli Slot, MD      . ipratropium-albuterol (DUONEB) 0.5-2.5 (3) MG/3ML nebulizer solution 3 mL  3 mL Nebulization QID PRN Loreli Slot, MD      . magnesium hydroxide (MILK OF MAGNESIA) suspension 30 mL  30 mL Oral Daily PRN Loreli Slot, MD      . metoprolol (LOPRESSOR) tablet 50 mg  50 mg Oral BID Lars Masson, MD   50 mg at 10/23/15 1610  . OLANZapine (ZYPREXA) tablet 5 mg  5 mg Oral QHS Loreli Slot, MD   5 mg at 10/22/15 2130  . ondansetron (ZOFRAN) injection 4 mg  4 mg Intravenous Q6H PRN Rowe Clack, PA-C   4 mg at 10/21/15 1411  . pantoprazole (PROTONIX) EC tablet 40 mg  40 mg Oral Daily Rowe Clack, PA-C   40 mg at 10/23/15 9604  . potassium chloride SA (K-DUR,KLOR-CON) CR tablet 20 mEq  20 mEq Oral Daily Loreli Slot, MD   20 mEq at 10/22/15 1042  . sertraline (ZOLOFT) tablet 50 mg  50 mg Oral q morning - 10a Loreli Slot, MD   50 mg at 10/23/15 0939  . sodium chloride flush (NS) 0.9 % injection 3 mL  3 mL Intravenous Q12H Loreli Slot, MD   3 mL at 10/22/15 2130  . sodium chloride flush (NS) 0.9 % injection 3 mL  3 mL Intravenous PRN Loreli Slot, MD      . traMADol Janean Sark) tablet 50 mg  50 mg Oral Q12H PRN Rowe Clack, PA-C   50 mg at 10/23/15 0829  . white petrolatum (VASELINE) gel   Topical PRN Erin R Barrett, PA-C       Facility-Administered Medications Ordered in Other Encounters  Medication  Dose Route Frequency Provider Last Rate Last Dose  . bivalirudin (ANGIOMAX) 250 mg in sodium chloride 0.9 %  50 mL (5 mg/mL) infusion  250 mg  Continuous PRN Marykay Lex, MD   Stopped at 10/06/15 1556  . bivalirudin (ANGIOMAX) BOLUS via infusion    PRN Marykay Lex, MD   27.225 mg at 10/06/15 1504  . fentaNYL (SUBLIMAZE) injection    PRN Marykay Lex, MD   25 mcg at 10/06/15 1529  . heparin infusion 2 units/mL in 0.9 % sodium chloride    Continuous PRN Marykay Lex, MD   1,500 mL at 10/06/15 1454  . iopamidol (ISOVUE-370) 76 % injection    PRN Marykay Lex, MD   150 mL at 10/06/15 1554  . labetalol (NORMODYNE,TRANDATE) injection    PRN Marykay Lex, MD   10 mg at 10/06/15 1526  . lidocaine (PF) (XYLOCAINE) 1 % injection    PRN Marykay Lex, MD   2 mL at 10/06/15 1454  . nitroGLYCERIN 1 mg/10 ml (100 mcg/ml) - IR/CATH LAB    PRN Marykay Lex, MD   200 mcg at 10/06/15 1525  . ondansetron (ZOFRAN) injection    PRN Marykay Lex, MD   4 mg at 10/06/15 1453  . Radial Cocktail (Verapamil 2.5 mg, NTG, Lidocaine)    PRN Marykay Lex, MD      . t[facility]) tablet    PRN Marykay Lex, MD   180 mg at 10/06/15 1506  . tirofiban (AGGRASTAT) bolus via infusion    PRN Marykay Lex, MD   907.5 mcg at 10/06/15 1558  . tirofiban (AGGRASTAT) infusion 50 mcg/mL 100 mL    Continuous PRN Marykay Lex, MD 3.3 mL/hr at 10/06/15 1606 0.075 mcg/kg/min at 10/06/15 1606    ROS:   General:  No weight loss, Fever, chills  HEENT: No recent headaches, no nasal bleeding, no visual changes, no sore throat  Neurologic: No dizziness, blackouts, seizures. No recent symptoms of stroke or mini- stroke. No recent episodes of slurred speech, or temporary blindness.  Cardiac: + shortness of breath with exertion.    Vascular: No history of rest pain in feet.  No history of claudication.  No history of non-healing ulcer, No history of DVT   Pulmonary: No home oxygen, no productive  cough, no hemoptysis,  No asthma or wheezing  Musculoskeletal:  [ ]  Arthritis, [ ]  Low back pain,  [ ]  Joint pain  Hematologic:No history of hypercoagulable state.  No history of easy bleeding.  No history of anemia  Gastrointestinal: No hematochezia or melena,  No gastroesophageal reflux, no trouble swallowing  Urinary: [ ]  chronic Kidney disease, [ ]  on HD - [ ]  MWF or [ ]  TTHS, [ ]  Burning with urination, [ ]  Frequent urination, [ ]  Difficulty urinating;   Skin: No rashes  Psychological: + history of anxiety,  No history of depression   Physical Examination  Filed Vitals:   10/22/15 1900 10/23/15 0609 10/23/15 0650 10/23/15 0938  BP: 111/66 119/62  118/60  Pulse: 92   88  Temp: 97.9 F (36.6 C) 97.8 F (36.6 C)    TempSrc: Oral Oral    Resp: 18 16    Height:      Weight:   103 lb 13.4 oz (47.1 kg)   SpO2: 95% 93%      Body mass index is 20.39 kg/(m^2).  General:  Alert and oriented, no acute distress HEENT: Normal Neck: No JVD Pulmonary: Clear to auscultation bilaterally Cardiac: Regular Rate and Rhythm, healing sternotomy, wires in  place Abdomen: Soft, mildly to moderate tenderness in epigastrium over mass but also has similar complaints in all quadrants of abdomen, pulsatile mass in epigastrium, lower midline scar from prior colon resection Skin: No rash Extremity Pulses:  2+ radial, brachial, femoral,absent dorsalis pedis bilaterally, 2+ right posterior tibial pulse Musculoskeletal: No deformity or edema  Neurologic: Upper and lower extremity motor 5/5 and symmetric  DATA:  CBC    Component Value Date/Time   WBC 9.4 10/21/2015 0300   RBC 3.71* 10/21/2015 0300   HGB 10.7* 10/21/2015 0300   HCT 33.8* 10/21/2015 0300   PLT 277 10/21/2015 0300   MCV 91.1 10/21/2015 0300   MCH 28.8 10/21/2015 0300   MCHC 31.7 10/21/2015 0300   RDW 15.0 10/21/2015 0300   LYMPHSABS 1.5 10/06/2015 1506   MONOABS 1.5* 10/06/2015 1506   EOSABS 0.0 10/06/2015 1506   BASOSABS 0.0  10/06/2015 1506     BMET    Component Value Date/Time   NA 131* 10/23/2015 0325   K 3.6 10/23/2015 0325   CL 94* 10/23/2015 0325   CO2 24 10/23/2015 0325   GLUCOSE 100* 10/23/2015 0325   BUN 23* 10/23/2015 0325   CREATININE 1.50* 10/23/2015 0325   CALCIUM 8.9 10/23/2015 0325   GFRNONAA 32* 10/23/2015 0325   GFRAA 37* 10/23/2015 0325     CTA abdomen pelvis: images reviewed with Dr Loreta Ave.  5.3 cm AAA infrarenal 2 cm but tortuous anterior angulated neck.  7 mm common femorals. No evidence of rupture, no change in tissue planes from prior non contrast CT 1 yr ago  ASSESSMENT:  Pt with 5.3 cm AAA difficult to know if her pain is from this but probably unlikely.  Watchful waiting for now.  Would consider repair if symptoms worsen otherwise consider repair in 4-6 weeks.  Would most likely be amenable to percutaneous stent graft  PLAN:  See above   Fabienne Bruns, MD [facility] Office: [phone] Pager: [phone]

## 2015-10-23 NOTE — Clinical Social Work Note (Signed)
CSW met with patient. No supports at bedside. CSW presented list of SNF's that had made bed offers. Patient said that her son is making the decision. CSW called patient's son, Tonya Payne [phone]), and he [facility]. CSW will notify admissions coordinator, Jolyne Loa.  [place] Scrape, [place]

## 2015-10-23 NOTE — Progress Notes (Signed)
Initial Nutrition Assessment  DOCUMENTATION CODES:   Not applicable  INTERVENTION:   -Ensure Enlive po BID, each supplement provides 350 kcal and 20 grams of protein  NUTRITION DIAGNOSIS:   Inadequate oral intake related to poor appetite as evidenced by meal completion < 50%.  GOAL:   Patient will meet greater than or equal to 90% of their needs  MONITOR:   PO intake, Supplement acceptance, Labs, Weight trends, Skin, I & O's  REASON FOR ASSESSMENT:   Malnutrition Screening Tool    ASSESSMENT:   81 year old female with no prior cardiac hx. On no meds except for antibiotic for UTI presents by EMS to the cath lab for STEMI. She felt mild discomfort yesterday that did not last. Today at 1300 she had significant chest pain- 10/10 -"something sitting on her chest" with nausea and SOB called her son and he told her to call EMS. EKG with ST elevation V4-6.   Pt admitted with chest pain/STEMI.   S/p Procedure(s) (LRB) 10/06/15: Left Heart Cath and Coronary Angiography (N/A) Coronary Balloon Angioplasty (N/A)  S/p Procedure(s on) 10/16/15: CORONARY ARTERY BYPASS GRAFTING (CABG) x 3  LIMA to LAD;  SVG to DIAGONAL;  SVG to RCA ENDOSCOPIC GREATER SAPHENOUS VEIN HARVEST(EVH) RIGHT THIGH TRANSESOPHAGEAL ECHOCARDIOGRAM (TEE)  Hx obtained from pt at bedside. She is currently NPO due to CT of abdomen. Pt reports decreased oral intake during hospitalization as a result of abdominal pain and "bland food". Meal completion averaging 25-50%. Pt reports PTA appetite was fair and she was consuming 3 meals per day ("I ate food that had salt on it").   Pt reports UBW of 100#. Pt reports she has been petite most of her adult life. She denies any weight loss PTA, however, suspect wt loss due to poor po intake during hospitalization.   Nutrition-Focused physical exam completed. Findings are mild fat depletion, no muscle depletion, and no edema. Pt confirms  she has been ambulating with cardiac rehab this AM and is fairly active at baseline.   Pt reports she has had Ensure supplements in the past but discontinued them when her appetite improved. She is willing to consume them during hospitalization to improve nutritional intake.   Labs reviewed: Na: 131 (on IV supplementation), CBGS: 97-133.   Diet Order:  Diet heart healthy/carb modified Room service appropriate?: Yes; Fluid consistency:: Thin  Skin:  Reviewed, no issues  Last BM:  10/22/15  Height:   Ht Readings from Last 1 Encounters:  10/19/15 4' 11.84" (1.52 m)    Weight:   Wt Readings from Last 1 Encounters:  10/23/15 103 lb 13.4 oz (47.1 kg)    Ideal Body Weight:  45.5 kg  BMI:  Body mass index is 20.39 kg/(m^2).  Estimated Nutritional Needs:   Kcal:  1200-1400  Protein:  60-70 grams  Fluid:  1.2-1.4 L  EDUCATION NEEDS:   Education needs addressed  Tonya Payne, RD, LDN, CDE Pager: [phone] After hours Pager: [phone]

## 2015-10-23 NOTE — Progress Notes (Signed)
Pt requested to have this RN get her [facility] and return it to pt. Pt has been given her wallet and pt has signed the yellow "repossession of valuables" form, stating that she has been returned her possessions. Pt stated that she will be sending her wallet home with her daughter-in-law today. Pt's daughter-in-law is at the pt's bedside at this time and will be leaving shortly with pt's belongings. Will continue to monitor.   Berdine DanceLauren Moffitt BSN, RN

## 2015-10-23 NOTE — Progress Notes (Signed)
Physical Therapy Treatment Patient Details Name: Julio AlmSuann Birch MRN: [id number] DOB: 08-23-1934 Today's Date: 10/23/2015    History of Present Illness Pt admit with STEMI.  Pt had CABG x3.     PT Comments    Pt admitted with above diagnosis. Pt currently with functional limitations due to balance and endurance deficits. Pt was able to ambulate but still c/o dizziness with ambulation.  Self limiting.  Rested on rollator several times.  Progress as able.  Pt will benefit from skilled PT to increase their independence and safety with mobility to allow discharge to the venue listed below.    Follow Up Recommendations  SNF;Supervision/Assistance - 24 hour     Equipment Recommendations  Other (comment) (TBA)    Recommendations for Other Services       Precautions / Restrictions Precautions Precautions: Fall;Sternal Restrictions Other Position/Activity Restrictions: sternal--pt needs VCs to follow sternal precautions    Mobility  Bed Mobility Overal bed mobility: Needs Assistance Bed Mobility: Supine to Sit     Supine to sit: Min assist     General bed mobility comments: Pt needed cues for sternal precautions with mobility to get to EOB.  Transfers Overall transfer level: Needs assistance Equipment used: 4-wheeled walker Transfers: Sit to/from Stand Sit to Stand: Mod assist;+2 safety/equipment         General transfer comment: verbal cues for hand placement for technique with sternal precautions  Ambulation/Gait Ambulation/Gait assistance: Min assist;Min guard;+2 safety/equipment Ambulation Distance (Feet): 120 Feet (30, sitting rest, 30 sitting rest, and then 60 back to room) Assistive device: 4-wheeled walker Gait Pattern/deviations: Step-to pattern;Decreased step length - right;Decreased step length - left;Decreased stride length;Trunk flexed;Wide base of support;Drifts right/left Gait velocity: decreased Gait velocity interpretation: Below normal speed for  age/gender General Gait Details: patient continues with unsteady gait at times, flexed posture max cues for upright positioning. 2 seated rest breaks with c/o dizziness.  BP on return to room 110/54 with HR 83bpm.    Stairs            Wheelchair Mobility    Modified Rankin (Stroke Patients Only)       Balance Overall balance assessment: Needs assistance;History of Falls Sitting-balance support: No upper extremity supported;Feet supported Sitting balance-[name]: Fair Sitting balance - Comments: can sit eOB without physical assist.    Standing balance support: Bilateral upper extremity supported;During functional activity Standing balance-[name]: Poor Standing balance comment: relies on UE support.                    Cognition Arousal/Alertness: Awake/alert Behavior During Therapy: Flat affect Overall Cognitive Status: Impaired/Different from baseline Area of Impairment: Following commands;Safety/judgement;Problem solving       Following Commands: Follows one step commands with increased time Safety/Judgement: Decreased awareness of safety   Problem Solving: Difficulty sequencing;Requires verbal cues;Requires tactile cues;Decreased initiation      Exercises General Exercises - Lower Extremity Quad Sets: AROM;Both;10 reps;Seated Long Arc Quad: AROM;Both;10 reps;Seated    General Comments        Pertinent Vitals/Pain Pain Assessment: No/denies pain  VSS    Home Living                      Prior Function            PT Goals (current goals can now be found in the care plan section) Progress towards PT goals: Progressing toward goals (1108)    Frequency  Min 3X/week    PT  Plan Current plan remains appropriate    Co-evaluation             End of Session Equipment Utilized During Treatment: Gait belt Activity Tolerance: Patient limited by fatigue Patient left: in bed;with call bell/phone within reach;with bed alarm  set     Time: 1610-9604 PT Time Calculation (min) (ACUTE ONLY): 12 min  Charges:  $Gait Training: 8-22 mins                    G Codes:      Berline Lopes Nov 08, 2015, 2:11 PM [employer] [facility] [phone] (pager)

## 2015-10-23 NOTE — Progress Notes (Addendum)
[address]       [place] [address]             [phone]      7 Days Post-Op Procedure(s) (LRB): CORONARY ARTERY BYPASS GRAFTING (CABG)  x three, using left internal mammary artery and right leg greater saphenous vein harvested endoscopically (N/A) TRANSESOPHAGEAL ECHOCARDIOGRAM (TEE) (N/A) Subjective: C/o abdominal and sternotomy pain  Objective: Vital signs in last 24 hours: Temp:  [97.8 F (36.6 C)-98.6 F (37 C)] 97.8 F (36.6 C) (06/19 0609) Pulse Rate:  [92-93] 92 (06/18 1900) Cardiac Rhythm:  [-] Normal sinus rhythm (06/18 1930) Resp:  [16-18] 16 (06/19 0609) BP: (111-126)/(57-66) 119/62 mmHg (06/19 0609) SpO2:  [93 %-99 %] 93 % (06/19 0609)  Hemodynamic parameters for last 24 hours:    Intake/Output from previous day: 06/18 0701 - 06/19 0700 In: -  Out: 150 [Urine:150] Intake/Output this shift:    General appearance: alert, cooperative and no distress Heart: regular rate and rhythm Lungs: clear to auscultation bilaterally Abdomen: + tender over abdominal aneurysm Extremities: no edema Wound: incis healing well  Lab Results:  Recent Labs  10/21/15 0300  WBC 9.4  HGB 10.7*  HCT 33.8*  PLT 277   BMET:  Recent Labs  10/21/15 0300 10/23/15 0325  NA 132* 131*  K 3.8 3.6  CL 97* 94*  CO2 22 24  GLUCOSE 112* 100*  BUN 21* 23*  CREATININE 1.45* 1.50*  CALCIUM 8.9 8.9    PT/INR: No results for input(s): LABPROT, INR in the last 72 hours. ABG    Component Value Date/Time   PHART 7.323* 10/16/2015 1826   HCO3 21.0 10/16/2015 1826   TCO2 22 10/17/2015 1606   ACIDBASEDEF 5.0* 10/16/2015 1826   O2SAT 97.0 10/16/2015 1826   CBG (last 3)   Recent Labs  10/21/15 1705 10/22/15 0559 10/22/15 1358  GLUCAP 124* 97 133*    Meds Scheduled Meds: . ALPRAZolam  0.25 mg Oral QHS  . amiodarone  200 mg Oral Daily  . amLODipine  5 mg Oral Daily  . aspirin EC  325 mg Oral Daily   Or  . aspirin  324 mg Per Tube Daily  .  atorvastatin  40 mg Oral q1800  . bisacodyl  10 mg Oral Daily   Or  . bisacodyl  10 mg Rectal Daily  . docusate sodium  200 mg Oral Daily  . enoxaparin (LOVENOX) injection  30 mg Subcutaneous QHS  . furosemide  40 mg Oral Daily  . metoprolol tartrate  50 mg Oral BID  . OLANZapine  5 mg Oral QHS  . pantoprazole  40 mg Oral Daily  . potassium chloride  20 mEq Oral Daily  . sertraline  50 mg Oral q morning - 10a  . sodium chloride flush  3 mL Intravenous Q12H   Continuous Infusions:  PRN Meds:.sodium chloride, acetaminophen, alum & mag hydroxide-simeth, guaiFENesin-dextromethorphan, ipratropium-albuterol, magnesium hydroxide, ondansetron (ZOFRAN) IV, sodium chloride flush, white petrolatum  Xrays Dg Chest 2 View  10/21/2015  CLINICAL DATA:  Atelectasis EXAM: CHEST  2 VIEW COMPARISON:  Chest radiograph from one day prior. FINDINGS: Surgical hardware from [facility]CD[facility] overlies the lower cervical spine. Sternotomy wires appear aligned and intact. CABG clips overlie the mediastinum. Stable cardiomediastinal silhouette with mild cardiomegaly. No pneumothorax. Small stable bilateral pleural effusions and bibasilar atelectasis. No pulmonary edema. IMPRESSION: 1. Stable mild cardiomegaly without pulmonary edema. 2. Stable small bilateral pleural effusions and bibasilar atelectasis. Electronically Signed  By: Delbert PhenixJason A Poff M.D.   On: 10/21/2015 10:13    Assessment/Plan: S/P Procedure(s) (LRB): CORONARY ARTERY BYPASS GRAFTING (CABG)  x three, using left internal mammary artery and right leg greater saphenous vein harvested endoscopically (N/A) TRANSESOPHAGEAL ECHOCARDIOGRAM (TEE) (N/A)  1 sternotomy discomfort, will add ultram 2 pain to palpation over abdominal aneurysm- [facility] will get [facility]. Also d/w Vascular surgery who will see .  3 make NPO     LOS: 17 days    GOLD,WAYNE E 10/23/2015  Patient seen and examined.  She is much more alert than she was last week She is obviously uncomfortable but  not in distress She says she hurts all over but points to abdomen when asked where it hurts the worst BP 119/62 mmHg  Pulse 92  Temp(Src) 97.8 F (36.6 C) (Oral)  Resp 16  Ht 4' 11.84" (1.52 m)  Wt 103 lb 13.4 oz (47.1 kg)  BMI 20.39 kg/m2  SpO2 93% Her abdomen is mildly tender but not acute (no rigidity or rebound) She does have a pulsatile mass and in my opinion we have to rule out the possibility of a leaking aneurysm although she doesn't strike as someone rupturing CT ordered Stat and vascular will see her this AM  Viviann SpareSteven C. Dorris FetchHendrickson, MD [facility] [phone]

## 2015-10-23 NOTE — Care Management Important Message (Signed)
Important Message  Patient Details  Name: Tonya Payne MRN: [id number] Date of Birth: 1934/09/08   Medicare Important Message Given:  Yes    Kyla BalzarineShealy, Raekwan Spelman Abena 10/23/2015, 11:25 AM

## 2015-10-24 ENCOUNTER — Encounter (HOSPITAL_COMMUNITY): Payer: Self-pay | Admitting: Surgical

## 2015-10-24 DIAGNOSIS — I714 Abdominal aortic aneurysm, without rupture, unspecified: Secondary | ICD-10-CM | POA: Diagnosis present

## 2015-10-24 DIAGNOSIS — Z9861 Coronary angioplasty status: Secondary | ICD-10-CM

## 2015-10-24 LAB — BASIC METABOLIC PANEL
ANION GAP: 10 (ref 5–15)
BUN: 28 mg/dL — ABNORMAL HIGH (ref 6–20)
CO2: 25 mmol/L (ref 22–32)
Calcium: 8.8 mg/dL — ABNORMAL LOW (ref 8.9–10.3)
Chloride: 92 mmol/L — ABNORMAL LOW (ref 101–111)
Creatinine, Ser: 1.78 mg/dL — ABNORMAL HIGH (ref 0.44–1.00)
GFR calc Af Amer: 30 mL/min — ABNORMAL LOW (ref >60)
GFR calc non Af Amer: 26 mL/min — ABNORMAL LOW (ref >60)
GLUCOSE: 119 mg/dL — AB (ref 65–99)
POTASSIUM: 4 mmol/L (ref 3.5–5.1)
Sodium: 127 mmol/L — ABNORMAL LOW (ref 135–145)

## 2015-10-24 MED ORDER — METOPROLOL TARTRATE 25 MG PO TABS
25.0000 mg | ORAL_TABLET | Freq: Two times a day (BID) | ORAL | Status: DC
  Administered 2015-10-24 – 2015-10-30 (×13): 25 mg via ORAL
  Filled 2015-10-24 (×13): qty 1

## 2015-10-24 NOTE — Discharge Summary (Signed)
Physician Discharge Summary  Patient ID: Tonya Payne MRN: [id number] DOB/AGE: [birth year] 81 y.o.  Admit date: 10/06/2015 Discharge date: 10/30/2015  Admission Diagnoses: 1. S/p STEMI 2. Coronary artery disease (left main disease included)  Active Diagnoses:  1. Congestive dilated cardiomyopathy (HCC) 2. Hyperlipidemia LDL goal <70 3. Abdominal aneurysm (HCC) 4. COPD (chronic obstructive pulmonary disease) (HCC) 5.Anxiety 6.Depression 7.TIA (transient ischemic attack) 8.IBS (irritable bowel syndrome) 9. Tobacco abuse 10. ABL anemia  Patient Active Problem List   Diagnosis Date Noted  . Abdominal aneurysm (HCC)   . Post PTCA   . S/P CABG x 3 10/16/2015  . CAD (coronary artery disease), native coronary artery 10/08/2015  . Congestive dilated cardiomyopathy (HCC) 10/08/2015  . Hyperlipidemia LDL goal <70 10/08/2015  . UTI (urinary tract infection) 10/06/2015  . ST elevation (STEMI) myocardial infarction involving left anterior descending coronary artery (HCC) 10/06/2015   Tonya Payne is a 81 yo woman with a history of tobacco abuse, COPD, TIA, IBS depression and anxiety. She had no cardiac history prior to this admission.   She presented 6/2/2017with a cc/o chest pain. She felt like something was sitting on her chest. She felt short of breath and had nausea. She called EMS. She had ST elevation in V4-6. Her pain improved but did not resolve with SL NTG. She was taken urgently to the cath lab and found to have severe disease in the LAD. Angioplasty was performed but there was incomplete expansion and a stent was not placed. She has a 75% left main stenosis, multiple calcified lesions in the LAD and a 50% stenosis in a large dominant LAD. The circumflex is a very small vessel.  She has been pain free since the cath.  She had hematuria and dysuria PTA and was being treated for a UTI  She was seen in cardiothoracic surgical consultation by Charlett Lango M.D. who evaluated  the patient and her studies and agreed with recommendations to proceed with coronary artery surgical revascularization.   Discharged Condition: good  Hospital Course: The patient was admitted through the cardiology service for further evaluation and management. She was taken directly to the cardiac catheterization lab where she underwent stent placement in the LAD. Findings were notable for residual 70% stenosis. Patient was found to have heavily calcified coronary arteries. She was placed on aspirin/statin/beta blocker/IV nitroglycerin and heparin drips as well as Brilinta. Of note also that she was found to have dilated cardiomyopathy with ejection fraction 20-25%. She had some initial acute kidney injury due to the dye exposure but creatinine did improve to baseline with hydration.    Surgery was scheduled and on 10/17/2015 she was taken to the operating room where she underwent the below described procedure. This was preceded by a Brilinta washout.  Postoperatively the patient has progressed somewhat slowly related to physical deconditioning both recent and chronic. She was able to be extubated from the ventilator using standard protocols without difficulty. She has remained hemodynamically stable and all routine lines, monitors and drainage devices have been discontinued in the standard fashion. She has required aggressive pulmonary toilet due to preoperative COPD as well as inhaler therapies. She did have an expected acute blood loss anemia requiring 2 units intraoperative which has been stable during the postoperative period. She continues to have some mild renal insufficiency which is stable during the postoperative period blood sugars have been under good control using standard protocols. She has had some postoperative confusing which is shown a steady improvement over time with an element  of sundowning. She has some postoperative volume overload which has responded to diuretics. She has had  postoperative atrial fibrillation which has been treated with amiodarone and beta blocker. She is maintaining sinus rhythm at this time. She did have an episode of abdominal discomfort postoperatively and due to her previously diagnosed 5 cm abdominal aneurysm it was felt that she should be evaluated by CT angiogram of the abdomen and pelvis as well as vascular surgical consultation. The aneurysm was felt to be stable in size with no evidence of dissection or rupture and will be followed up as an outpatient for tentative stent graft placement in approximately 6 weeks following recovery from CABG. She is felt to require further rehabilitation in a skilled nursing facility and tentatively it is felt she will be stable for transfer in the next 24-48 hours pending ongoing reevaluation of her recovery.  We ask the SNF to please do the following: 1. Please obtain vital signs at least one time daily 2.Please weigh the patient daily. If he or she continues to gain weight or develops lower extremity edema, contact the office at [phone]. 3. Ambulate patient at least three times daily and please use sternal precautions.  Consults: vascular surgery  Significant Diagnostic Studies: angiography: abdomen and pelvis 10/23/2015   Treatments: surgery:  DATE OF PROCEDURE: 10/17/2015 DATE OF DISCHARGE:   OPERATIVE REPORT   PREOPERATIVE DIAGNOSIS: Left main disease, status post ST-elevation myocardial infarction.  POSTOPERATIVE DIAGNOSIS: Left main disease, status post ST-elevation myocardial infarction.  PROCEDURES: Median sternotomy, extracorporeal circulation, coronary artery bypass grafting x3 (left internal mammary artery to left anterior descending artery, saphenous vein graft to diagonal, saphenous vein graft to right coronary), endoscopic vein harvest, right thigh.  SURGEON: Salvatore Decent. [name], M.D.  ASSISTANT: Rowe Clack,  P.A.-C.  ANESTHESIA: General.  FINDINGS: Transesophageal echocardiography showed ejection fraction approximately 40%. There was mild mitral regurgitation. Good quality targets. Good quality conduits. Low cardiac output prior to surgery, improved with low-dose dopamine, post bypass.  Discharge Exam: Blood pressure 132/74, pulse 81, temperature 97.6 F (36.4 C), temperature source Oral, resp. rate 18, height 4' 11.84" (1.52 m), weight 103 lb 6.4 oz (46.902 kg), SpO2 100 %.  Cardiovascular: RRR Pulmonary: Clear to auscultation bilaterally Abdomen: Soft, non tender, bowel sounds present. Extremities: No lower extremity edema. Wounds: Clean and dry. No erythema or signs of infection.  Disposition: SNF    Medication List    STOP taking these medications        ALPRAZolam 0.25 MG tablet  Commonly known as:  XANAX     KLOR-CON M10 10 MEQ tablet  Generic drug:  potassium chloride      TAKE these medications        ALEVE 220 MG tablet  Generic drug:  naproxen sodium  Take 440 mg by mouth 2 (two) times daily as needed (for headaches or pain).     aspirin 325 MG EC tablet  Take 1 tablet (325 mg total) by mouth daily.     atorvastatin 40 MG tablet  Commonly known as:  LIPITOR  Take 1 tablet (40 mg total) by mouth daily at 6 PM.     feeding supplement (ENSURE ENLIVE) Liqd  Take 237 mLs by mouth 3 (three) times daily between meals.     FUSION PO  Take 1 tablet by mouth daily.     metoprolol tartrate 25 MG tablet  Commonly known as:  LOPRESSOR  Take 1 tablet (25 mg total) by mouth 2 (two)  times daily.     OLANZapine 5 MG tablet  Commonly known as:  ZYPREXA  Take 5 mg by mouth at bedtime.     phenazopyridine 200 MG tablet  Commonly known as:  PYRIDIUM  Take 200 mg by mouth 3 (three) times daily as needed for pain.     sertraline 50 MG tablet  Commonly known as:  ZOLOFT  Take 50 mg by mouth every morning.     traMADol 50 MG tablet  Commonly known as:   ULTRAM  Take 1 tablet (50 mg total) by mouth every 12 (twelve) hours as needed for moderate pain.       Follow-up Information    Follow up with [facility].   Specialty:  Skilled Nursing Facility   Contact information:   [address] [place] [id number] [phone]      Follow up with Loreli SlotSteven C Ashley Montminy, MD.   Specialty:  Cardiothoracic Surgery   Contact information:   [address] [place] [address] [phone]       Follow up with Fabienne BrunsFields, Charles, MD.   Specialties:  Vascular Surgery, Cardiology   Why:  call to arrange appt in 4 weeks   Contact information:   [address] [place] [address] [phone]       Follow up with Barrett, Bjorn Loserhonda, PA-C.   Specialties:  Cardiology, Radiology   Why:  11/19/2015 at 9:30 am   Contact information:   [address] [place] [address] [phone]      1. Please obtain vital signs at least one time daily 2.Please weigh the patient daily. If he or she continues to gain weight or develops lower extremity edema, contact the office at [phone]. 3. Ambulate patient at least three times daily and please use sternal precautions. SignedGershon Crane: GOLD,WAYNE E PA-C 10/30/2015, 7:50 AM

## 2015-10-24 NOTE — Progress Notes (Signed)
CARDIAC REHAB PHASE I   PRE:  Rate/Rhythm: 79 SR  BP:  Lying: 94/43  Sitting: 100/51  Standing: 88/54  Sitting: 101/60        SaO2: 93 RA  MODE:  Ambulation: 30 ft   POST:  Rate/Rhythm: 85 SR  BP:  Sitting: 96/65         SaO2: 92 RA  Pt in bed, states she is sleepy, required x2 assist from lying to sitting position. Pt able to stand with minimal assistance, c/o dizziness upon standing (see BP as above), requested to sit again. Pt stood again, ambulated 30 ft on RA, rollator, IV, gait belt, assist x2, slow, mildly unsteady gait at times, tolerated fair. Pt c/o weakness in her legs, dizziness, seated rest x1. Encouraged pt to be out of bed, up in recliner, encouraged use of bedside commode (pt states she has been requesting to use the bedpan so as not to have to get out of bed), IS, and additional ambulation x2 today. Pt verbalized understanding. Pt to recliner after walk, feet elevated, chair alarm on, call bell within reach. Will follow as x2.   [phone]  Joylene GrapesEmily C Sebastiana Wuest, RN, BSN 10/24/2015 8:44 AM

## 2015-10-24 NOTE — Progress Notes (Addendum)
[address]       [place] [address]             [phone]      8 Days Post-Op Procedure(s) (LRB): CORONARY ARTERY BYPASS GRAFTING (CABG)  x three, using left internal mammary artery and right leg greater saphenous vein harvested endoscopically (N/A) TRANSESOPHAGEAL ECHOCARDIOGRAM (TEE) (N/A) Subjective: Feels a little better, less abdominal discomfort, no nausea  Objective: Vital signs in last 24 hours: Temp:  [97.6 F (36.4 C)-98.1 F (36.7 C)] 98 F (36.7 C) (06/20 0531) Pulse Rate:  [77-88] 82 (06/20 0531) Cardiac Rhythm:  [-] Normal sinus rhythm (06/20 0531) Resp:  [18-24] 18 (06/20 0531) BP: (86-128)/(47-60) 98/58 mmHg (06/20 0531) SpO2:  [91 %-94 %] 93 % (06/20 0531) Weight:  [108 lb 11 oz (49.3 kg)] 108 lb 11 oz (49.3 kg) (06/20 0543)  Hemodynamic parameters for last 24 hours:    Intake/Output from previous day: 06/19 0701 - 06/20 0700 In: 474 [P.O.:474] Out: 200 [Urine:200] Intake/Output this shift:    General appearance: alert, cooperative and no distress Heart: regular rate and rhythm Lungs: clear to auscultation bilaterally Abdomen: some tenderness over aneurysm, less than yesterday Extremities: no edema Wound: incis healing well  Lab Results: No results for input(s): WBC, HGB, HCT, PLT in the last 72 hours. BMET:  Recent Labs  10/23/15 0325  NA 131*  K 3.6  CL 94*  CO2 24  GLUCOSE 100*  BUN 23*  CREATININE 1.50*  CALCIUM 8.9    PT/INR: No results for input(s): LABPROT, INR in the last 72 hours. ABG    Component Value Date/Time   PHART 7.323* 10/16/2015 1826   HCO3 21.0 10/16/2015 1826   TCO2 22 10/17/2015 1606   ACIDBASEDEF 5.0* 10/16/2015 1826   O2SAT 97.0 10/16/2015 1826   CBG (last 3)   Recent Labs  10/21/15 1705 10/22/15 0559 10/22/15 1358  GLUCAP 124* 97 133*    Meds Scheduled Meds: . ALPRAZolam  0.25 mg Oral QHS  . amiodarone  200 mg Oral Daily  . aspirin EC  325 mg Oral Daily   Or  .  aspirin  324 mg Per Tube Daily  . atorvastatin  40 mg Oral q1800  . bisacodyl  10 mg Oral Daily   Or  . bisacodyl  10 mg Rectal Daily  . docusate sodium  200 mg Oral Daily  . enoxaparin (LOVENOX) injection  30 mg Subcutaneous QHS  . feeding supplement (ENSURE ENLIVE)  237 mL Oral BID BM  . furosemide  40 mg Oral Daily  . metoprolol tartrate  50 mg Oral BID  . OLANZapine  5 mg Oral QHS  . pantoprazole  40 mg Oral Daily  . potassium chloride  20 mEq Oral Daily  . sertraline  50 mg Oral q morning - 10a  . sodium chloride flush  3 mL Intravenous Q12H   Continuous Infusions: . sodium chloride 100 mL/hr at 10/24/15 0532   PRN Meds:.sodium chloride, acetaminophen, alum & mag hydroxide-simeth, guaiFENesin-dextromethorphan, ipratropium-albuterol, magnesium hydroxide, ondansetron (ZOFRAN) IV, sodium chloride flush, traMADol, white petrolatum  Xrays Ct Angio Abd/pel W/ And/or W/o  10/23/2015  CLINICAL DATA:  81 year old female with a history of abdominal pain. Recent CABG. Three vessel CABG performed 10/16/2015 EXAM: CTA ABDOMEN AND PELVIS WITH CONTRAST TECHNIQUE: Multidetector CT imaging of the abdomen and pelvis was performed using the standard protocol during bolus administration of intravenous contrast. Multiplanar reconstructed images and MIPs were obtained and reviewed  to evaluate the vascular anatomy. CONTRAST:  75 cc Isovue 370 COMPARISON:  08/17/2014, 05/14/2013 FINDINGS: Lower chest: Partially visualized surgical changes of recent median sternotomy with surgical clips in the anterior mediastinum. Trace infiltration/ fluid compatible a recent surgery. Compared to prior chest x-ray there appears to be interval removal of the epicardial pacing leads. Re- demonstration of advanced changes of paraseptal/ centrilobular emphysema and scarring. Small bilateral pleural effusions with associated atelectasis. Irregular soft plaque of the distal thoracic aorta. Abdomen/pelvis: Nonvascular: Unremarkable  liver, spleen, bilateral adrenal glands. Unremarkable gallbladder. Unremarkable appearance of the pancreas. Bilateral renal cortical thinning, more pronounced on the left. No hydronephrosis or nephrolithiasis. No abnormally distended small bowel or colon. Surgical suture line in the mid small bowel with no transition point. Colonic diverticula with no associated inflammatory changes. Appendix is not visualized, however, no inflammatory changes are present adjacent to the cecum to indicate an appendicitis. No free fluid.  No intraperitoneal air. No inflammatory changes within the mesenteric or within the retroperitoneum. Urinary bladder distention. Vascular: Aorta: Re- demonstration of fusiform infrarenal abdominal aortic aneurysm. Greatest diameter measures 4.8 cm, which is essentially unchanged from the comparison CT of 08/17/2014 when the aneurysm was measured up to 5 cm. No evidence of inflammatory changes adjacent to the aneurysm sac. No fluid. No dissection. Note that the lowest renal artery is the left renal artery with a diameter of the aorta at the level of the left renal artery measuring approximately 1.8 cm. Aortic neck length measures approximately 1.5 cm. Near circumferential mural thrombus in the lower aneurysm sac just above the bifurcation. Mesenteric vessels: Atherosclerotic changes at the origin of both the celiac artery and superior mesenteric artery. 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 Less than 50% stenosis at the origin of the superior mesenteric artery. Origin of the inferior mesenteric artery appears occluded. Atherosclerotic changes at the origin the right renal artery with at least 50% stenosis. Atherosclerotic changes at the origin of the left renal artery, with the greatest degree of stenosis at least 1 cm beyond the origin. Perfusion of the kidney parenchyma relatively symmetric. Right lower extremity: Atherosclerotic changes involve the origin of the right common iliac  artery. Right common iliac artery diameter measures approximately 8 mm. Dense calcifications extend along the length of the right common iliac artery. Hypogastric artery appears patent including anterior and posterior divisions. External iliac artery of small caliber though patent. Proximal femoral vasculature patent. Left lower extremity: Atherosclerotic changes involve the origin of the left common iliac artery. Left common iliac artery diameter measures 9 mm. No aneurysm or dissection. Hypogastric artery is patent with stenosis at the origin. Posterior division patent. Anterior division occluded. Left external iliac artery is patent. Proximal left femoral vessels patent. Musculoskeletal: No displaced fracture.  Mild degenerative changes of the spine. Degenerative changes of the hips. Review of the MIP images confirms the above findings. IMPRESSION: Vascular: Re- demonstration of infrarenal fusiform abdominal aortic aneurysm with unchanged maximum diameter from the most recent CT of approximately 1 year ago. No evidence of rupture or rapid growth. Maximum diameter measures approximately 4.8 cm on the current study. These findings were discussed with Dr. Fabienne Brunsharles Fields of Vascular Surgery in person at the time of the study interpretation. No acute findings to account for the patient's new epigastric pain. Partially visualized early surgical changes of the lower mediastinum compatible with history of median sternotomy. Compared to the prior chest x-ray, epicardial pacing leads have been removed. Atherosclerotic changes of the abdominal aorta  and mesenteric vessels, as above. Of note, there is likely greater than 50% stenosis of the celiac artery origin and bilateral renal arteries. Nonvascular: Urinary bladder distention. Small bilateral pleural effusions with associated atelectasis. Re- demonstration of advanced changes of emphysema. Signed, Yvone Neu. Loreta Ave, DO [facility]  [facility] Radiology Electronically Signed   By: Gilmer Mor D.O.   On: 10/23/2015 09:50    Assessment/Plan: S/P Procedure(s) (LRB): CORONARY ARTERY BYPASS GRAFTING (CABG)  x three, using left internal mammary artery and right leg greater saphenous vein harvested endoscopically (N/A) TRANSESOPHAGEAL ECHOCARDIOGRAM (TEE) (N/A)  1 stable  2 CTA results noted, appreciate vascular consult 3 cont to push rehab- SNF soon 4 stopped norvasc for low BP, 20mm gradient left c/w right noted BP difference 5 check BMet this am with dye from scan, [facility] now at [facility]  LOS: 18 days    GOLD,WAYNE E 10/24/2015  Patient seen and examined, agree with above She c/o dizziness, will decrease metoprolol to 25 mg BID She is still on amiodarone 200 mg daily She has had a couple of episodes of atrial fibrillation, but is a poor candidate for long term anticoagulation due to frailty and fall risk  Viviann Spare C. [name], MD [facility] [phone]

## 2015-10-24 NOTE — Anesthesia Postprocedure Evaluation (Signed)
Anesthesia Post Note  Patient: Tonya Payne  Procedure(Payne) Performed: Procedure(Payne) (LRB): CORONARY ARTERY BYPASS GRAFTING (CABG)  x three, using left internal mammary artery and right leg greater saphenous vein harvested endoscopically (N/A) TRANSESOPHAGEAL ECHOCARDIOGRAM (TEE) (N/A)  Patient location during evaluation: ICU Anesthesia Type: General Level of consciousness: sedated and patient remains intubated per anesthesia plan Pain management: pain level controlled Vital Signs Assessment: post-procedure vital signs reviewed and stable Respiratory status: patient remains intubated per anesthesia plan Cardiovascular status: stable Anesthetic complications: no    Last Vitals:  Filed Vitals:   10/23/15 2041 10/24/15 0531  BP: 128/55 98/58  Pulse: 77 82  Temp: 36.7 C 36.7 C  Resp: 20 18    Last Pain:  Filed Vitals:   10/24/15 0734  PainSc: Asleep                 Tonya Payne

## 2015-10-24 NOTE — Clinical Social Work Note (Signed)
CSW met with patient. Patient reported her preferred SNF is [facility]. CSW called Admission Coordinator Ivin Booty. She reported she has a bed available for patient once medically stable. CSW continuing to follow for support and discharge needs.  [employer], LCSW [phone]

## 2015-10-25 LAB — BASIC METABOLIC PANEL
ANION GAP: 11 (ref 5–15)
BUN: 34 mg/dL — AB (ref 6–20)
CALCIUM: 8.9 mg/dL (ref 8.9–10.3)
CO2: 26 mmol/L (ref 22–32)
Chloride: 93 mmol/L — ABNORMAL LOW (ref 101–111)
Creatinine, Ser: 2.04 mg/dL — ABNORMAL HIGH (ref 0.44–1.00)
GFR calc Af Amer: 26 mL/min — ABNORMAL LOW (ref >60)
GFR, EST NON AFRICAN AMERICAN: 22 mL/min — AB (ref >60)
GLUCOSE: 114 mg/dL — AB (ref 65–99)
POTASSIUM: 4.2 mmol/L (ref 3.5–5.1)
SODIUM: 130 mmol/L — AB (ref 135–145)

## 2015-10-25 MED ORDER — PHENAZOPYRIDINE HCL 200 MG PO TABS
200.0000 mg | ORAL_TABLET | Freq: Three times a day (TID) | ORAL | Status: DC | PRN
  Filled 2015-10-25 (×3): qty 1

## 2015-10-25 MED ORDER — MIRTAZAPINE 15 MG PO TBDP
15.0000 mg | ORAL_TABLET | Freq: Every day | ORAL | Status: DC
  Administered 2015-10-25 – 2015-10-29 (×5): 15 mg via ORAL
  Filled 2015-10-25 (×5): qty 1

## 2015-10-25 NOTE — Consult Note (Signed)
[facility]  Subjective  - abdomen pain better   Objective 112/46 71 97.8 F (36.6 C) (Oral) 18 94%  Intake/Output Summary (Last 24 hours) at 10/25/15 0757 Last data filed at 10/24/15 1648  Gross per 24 hour  Intake    853 ml  Output      0 ml  Net    853 ml   Abdomen mild tenderness overall improve  Assessment/Planning: AAA with abdominal pain.  Pain is improved. Creatinine trending up may be some contrast nephropathy Will sign off for now.  Plan is for follow up in 1 month to consider repair  Fabienne BrunsFields, Charles 10/25/2015 7:57 AM --  Laboratory Lab Results: No results for input(s): WBC, HGB, HCT, PLT in the last 72 hours. BMET  Recent Labs  10/24/15 0843 10/25/15 0320  NA 127* 130*  K 4.0 4.2  CL 92* 93*  CO2 25 26  GLUCOSE 119* 114*  BUN 28* 34*  CREATININE 1.78* 2.04*  CALCIUM 8.8* 8.9    COAG Lab Results  Component Value Date   INR 1.51* 10/16/2015   INR 1.07 10/15/2015   INR 1.02 10/06/2015   No results found for: PTT

## 2015-10-25 NOTE — Progress Notes (Signed)
Physical Therapy Treatment Patient Details Name: Tonya Payne MRN: [id number] DOB: [birth year] Today's Date: 10/25/2015    History of Present Illness Pt admit with STEMI.  Pt had CABG x3.     PT Comments    Patient slowly moving and needing increased assist this session for bed mobility.  Agree with SNF level of care, but not sure of reason she has no energy and is not motivated for mobility, but will continue skilled acute PT until d/c.  Follow Up Recommendations  SNF;Supervision/Assistance - 24 hour     Equipment Recommendations  Other (comment) (TBA)    Recommendations for Other Services       Precautions / Restrictions Precautions Precautions: Fall;Sternal    Mobility  Bed Mobility Overal bed mobility: Needs Assistance Bed Mobility: Supine to Sit;Sit to Supine     Supine to sit: HOB elevated;Max assist Sit to supine: Mod assist   General bed mobility comments: assist to get legs off EOB and to lift trunk upright as well as to scoot pt to EOB; to supine assist for legs into bed from sidelying  Transfers   Equipment used: 4-wheeled walker Transfers: Sit to/from Stand Sit to Stand: Min assist         General transfer comment: up from EOB, and rollator  Ambulation/Gait Ambulation/Gait assistance: Min assist;Mod assist Ambulation Distance (Feet): 52 Feet (12', 18', 30') Assistive device: Rolling walker (2 wheeled) Gait Pattern/deviations: Decreased stride length;Shuffle;Trunk flexed;Narrow base of support;Step-to pattern     General Gait Details: max cues and assist for upright posture, requesting to sit and rest frequently, and on last rest requesting to get back to bed as soon as possible, so walked back to bed wtih HHA    Stairs            Wheelchair Mobility    Modified Rankin (Stroke Patients Only)       Balance   Sitting-balance support: Bilateral upper extremity supported;Feet supported Sitting balance-[name]: Poor Sitting balance  - Comments: falling posteriorly for several minutes when at EOB mod to min support for balance, then more   Standing balance support: Bilateral upper extremity supported Standing balance-[name]: Poor Standing balance comment: Ue support for balance                    Cognition Arousal/Alertness: Awake/alert Behavior During Therapy: Flat affect Overall Cognitive Status: Impaired/Different from baseline Area of Impairment: Following commands;Safety/judgement;Problem solving       Following Commands: Follows one step commands with increased time Safety/Judgement: Decreased awareness of safety;Decreased awareness of deficits   Problem Solving: Difficulty sequencing;Requires verbal cues;Requires tactile cues;Decreased initiation      Exercises      General Comments        Pertinent Vitals/Pain Faces Pain Scale: Hurts little more Pain Location: chest, incision Pain Descriptors / Indicators: Sore;Discomfort Pain Intervention(s): Monitored during session;Repositioned    Home Living                      Prior Function            PT Goals (current goals can now be found in the care plan section) Progress towards PT goals: Progressing toward goals    Frequency  Min 3X/week    PT Plan Current plan remains appropriate    Co-evaluation             End of Session Equipment Utilized During Treatment: Gait belt Activity Tolerance: Patient limited by fatigue  Patient left: in bed;with bed alarm set     Time: [id number] PT Time Calculation (min) (ACUTE ONLY): 24 min  Charges:  $Gait Training: 8-22 mins $Therapeutic Activity: 8-22 mins                    G Codes:      Elray McgregorCynthia Dossie Ocanas 10/25/2015, 4:08 PM Sheran Lawlessyndi Norely Schlick, PT [phone] 10/25/2015

## 2015-10-25 NOTE — Progress Notes (Signed)
CARDIAC REHAB PHASE I   PRE:  Rate/Rhythm: 94 SR  BP:  Supine:   Sitting: 123/56  Standing:    SaO2: 93%RA  MODE:  Ambulation: 40 ft   POST:  Rate/Rhythm: 117 ST  BP:  Supine:   Sitting: 105/65  Standing:    SaO2: 95%RA [phone] Pt seen by me prior to surgery and always receptive to walking and was going up to 400 ft. Had to encourage pt to attempt walk today. Pt walked 40 ft on RA with rollator, gait belt and asst x 2 wanting to sit as soon as she stood. Would get walker out too far. Very deconditioned and wanting to sit. Sat twice and kept saying that she could not walk.  Insisting to go back to bed. Offered BSC prior to putting in bed but refused. Pt needs much assistance when walking with us encouraging her to move feet. Seems as if she has just given up. Tried to encourage pt but she stated her heart hurt and let her be.  Luetta Nuttingharlene Jose Alleyne, RN BSN  10/25/2015 9:19 AM

## 2015-10-25 NOTE — Progress Notes (Addendum)
[address]       [place] [id number]             [phone]      9 Days Post-Op Procedure(s) (LRB): CORONARY ARTERY BYPASS GRAFTING (CABG)  x three, using left internal mammary artery and right leg greater saphenous vein harvested endoscopically (N/A) TRANSESOPHAGEAL ECHOCARDIOGRAM (TEE) (N/A) Subjective: Poorly motivated this am  Objective: Vital signs in last 24 hours: Temp:  [97.8 F (36.6 C)-98.1 F (36.7 C)] 97.8 F (36.6 C) (06/21 0442) Pulse Rate:  [71-79] 71 (06/21 0442) Cardiac Rhythm:  [-] Normal sinus rhythm (06/20 1930) Resp:  [18-24] 18 (06/21 0442) BP: (101-112)/(46-66) 112/46 mmHg (06/21 0442) SpO2:  [94 %-97 %] 94 % (06/21 0442) Weight:  [109 lb 5.6 oz (49.6 kg)] 109 lb 5.6 oz (49.6 kg) (06/21 0444)  Hemodynamic parameters for last 24 hours:    Intake/Output from previous day: 06/20 0701 - 06/21 0700 In: 853 [P.O.:853] Out: -  Intake/Output this shift:    General appearance: alert, cooperative, fatigued and no distress Heart: regular rate and rhythm Lungs: dim in bases Abdomen: less tender, only mild at this point Extremities: no edema Wound: incis healing well  Lab Results: No results for input(s): WBC, HGB, HCT, PLT in the last 72 hours. BMET:  Recent Labs  10/24/15 0843 10/25/15 0320  NA 127* 130*  K 4.0 4.2  CL 92* 93*  CO2 25 26  GLUCOSE 119* 114*  BUN 28* 34*  CREATININE 1.78* 2.04*  CALCIUM 8.8* 8.9    PT/INR: No results for input(s): LABPROT, INR in the last 72 hours. ABG    Component Value Date/Time   PHART 7.323* 10/16/2015 1826   HCO3 21.0 10/16/2015 1826   TCO2 22 10/17/2015 1606   ACIDBASEDEF 5.0* 10/16/2015 1826   O2SAT 97.0 10/16/2015 1826   CBG (last 3)   Recent Labs  10/22/15 1358  GLUCAP 133*    Meds Scheduled Meds: . ALPRAZolam  0.25 mg Oral QHS  . amiodarone  200 mg Oral Daily  . aspirin EC  325 mg Oral Daily   Or  . aspirin  324 mg Per Tube Daily  . atorvastatin  40 mg  Oral q1800  . bisacodyl  10 mg Oral Daily   Or  . bisacodyl  10 mg Rectal Daily  . docusate sodium  200 mg Oral Daily  . enoxaparin (LOVENOX) injection  30 mg Subcutaneous QHS  . feeding supplement (ENSURE ENLIVE)  237 mL Oral BID BM  . furosemide  40 mg Oral Daily  . metoprolol tartrate  25 mg Oral BID  . OLANZapine  5 mg Oral QHS  . pantoprazole  40 mg Oral Daily  . potassium chloride  20 mEq Oral Daily  . sertraline  50 mg Oral q morning - 10a  . sodium chloride flush  3 mL Intravenous Q12H   Continuous Infusions: . sodium chloride 10 mL/hr at 10/24/15 0821   PRN Meds:.sodium chloride, acetaminophen, alum & mag hydroxide-simeth, guaiFENesin-dextromethorphan, ipratropium-albuterol, magnesium hydroxide, ondansetron (ZOFRAN) IV, sodium chloride flush, traMADol, white petrolatum  Xrays Ct Angio Abd/pel W/ And/or W/o  10/23/2015  CLINICAL DATA:  81 year old female with a history of abdominal pain. Recent CABG. Three vessel CABG performed 10/16/2015 EXAM: CTA ABDOMEN AND PELVIS WITH CONTRAST TECHNIQUE: Multidetector CT imaging of the abdomen and pelvis was performed using the standard protocol during bolus administration of intravenous contrast. Multiplanar reconstructed images and MIPs were obtained and reviewed to  evaluate the vascular anatomy. CONTRAST:  75 cc Isovue 370 COMPARISON:  08/17/2014, 05/14/2013 FINDINGS: Lower chest: Partially visualized surgical changes of recent median sternotomy with surgical clips in the anterior mediastinum. Trace infiltration/ fluid compatible a recent surgery. Compared to prior chest x-ray there appears to be interval removal of the epicardial pacing leads. Re- demonstration of advanced changes of paraseptal/ centrilobular emphysema and scarring. Small bilateral pleural effusions with associated atelectasis. Irregular soft plaque of the distal thoracic aorta. Abdomen/pelvis: Nonvascular: Unremarkable liver, spleen, bilateral adrenal glands. Unremarkable  gallbladder. Unremarkable appearance of the pancreas. Bilateral renal cortical thinning, more pronounced on the left. No hydronephrosis or nephrolithiasis. No abnormally distended small bowel or colon. Surgical suture line in the mid small bowel with no transition point. Colonic diverticula with no associated inflammatory changes. Appendix is not visualized, however, no inflammatory changes are present adjacent to the cecum to indicate an appendicitis. No free fluid.  No intraperitoneal air. No inflammatory changes within the mesenteric or within the retroperitoneum. Urinary bladder distention. Vascular: Aorta: Re- demonstration of fusiform infrarenal abdominal aortic aneurysm. Greatest diameter measures 4.8 cm, which is essentially unchanged from the comparison CT of 08/17/2014 when the aneurysm was measured up to 5 cm. No evidence of inflammatory changes adjacent to the aneurysm sac. No fluid. No dissection. Note that the lowest renal artery is the left renal artery with a diameter of the aorta at the level of the left renal artery measuring approximately 1.8 cm. Aortic neck length measures approximately 1.5 cm. Near circumferential mural thrombus in the lower aneurysm sac just above the bifurcation. Mesenteric vessels: Atherosclerotic changes at the origin of both the celiac artery and superior mesenteric artery. 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 Less than 50% stenosis at the origin of the superior mesenteric artery. Origin of the inferior mesenteric artery appears occluded. Atherosclerotic changes at the origin the right renal artery with at least 50% stenosis. Atherosclerotic changes at the origin of the left renal artery, with the greatest degree of stenosis at least 1 cm beyond the origin. Perfusion of the kidney parenchyma relatively symmetric. Right lower extremity: Atherosclerotic changes involve the origin of the right common iliac artery. Right common iliac artery diameter measures  approximately 8 mm. Dense calcifications extend along the length of the right common iliac artery. Hypogastric artery appears patent including anterior and posterior divisions. External iliac artery of small caliber though patent. Proximal femoral vasculature patent. Left lower extremity: Atherosclerotic changes involve the origin of the left common iliac artery. Left common iliac artery diameter measures 9 mm. No aneurysm or dissection. Hypogastric artery is patent with stenosis at the origin. Posterior division patent. Anterior division occluded. Left external iliac artery is patent. Proximal left femoral vessels patent. Musculoskeletal: No displaced fracture.  Mild degenerative changes of the spine. Degenerative changes of the hips. Review of the MIP images confirms the above findings. IMPRESSION: Vascular: Re- demonstration of infrarenal fusiform abdominal aortic aneurysm with unchanged maximum diameter from the most recent CT of approximately 1 year ago. No evidence of rupture or rapid growth. Maximum diameter measures approximately 4.8 cm on the current study. These findings were discussed with Dr. Fabienne Bruns of Vascular Surgery in person at the time of the study interpretation. No acute findings to account for the patient's new epigastric pain. Partially visualized early surgical changes of the lower mediastinum compatible with history of median sternotomy. Compared to the prior chest x-ray, epicardial pacing leads have been removed. Atherosclerotic changes of the abdominal aorta and  mesenteric vessels, as above. Of note, there is likely greater than 50% stenosis of the celiac artery origin and bilateral renal arteries. Nonvascular: Urinary bladder distention. Small bilateral pleural effusions with associated atelectasis. Re- demonstration of advanced changes of emphysema. Signed, Yvone Neu. Loreta Ave, DO [facility] Radiology Electronically Signed   By: Gilmer Mor D.O.   On: 10/23/2015 09:50    Assessment/Plan: S/P Procedure(s) (LRB): CORONARY ARTERY BYPASS GRAFTING (CABG)  x three, using left internal mammary artery and right leg greater saphenous vein harvested endoscopically (N/A) TRANSESOPHAGEAL ECHOCARDIOGRAM (TEE) (N/A)  1 some worsening renal insuff d/t dye hopefully has peaked 2 she is still significantly deconditioned, SNF when stable for d/c for rehab 3 hemodyn stable in sinus rhythm    LOS: 19 days    GOLD,WAYNE E 10/25/2015  Patient seen and examined, agree with above Creatinine is up post dye- follow until improving Daughter-in-law with her now- she is concerned that Mrs. Beaumont is refusing therapy, ambulation, etc. as am I. To SNF when creatinine is improving  Viviann Spare C. [name], MD [facility] [phone]

## 2015-10-26 LAB — BASIC METABOLIC PANEL
ANION GAP: 13 (ref 5–15)
BUN: 38 mg/dL — ABNORMAL HIGH (ref 6–20)
CALCIUM: 9.3 mg/dL (ref 8.9–10.3)
CO2: 26 mmol/L (ref 22–32)
CREATININE: 2.58 mg/dL — AB (ref 0.44–1.00)
Chloride: 91 mmol/L — ABNORMAL LOW (ref 101–111)
GFR calc Af Amer: 19 mL/min — ABNORMAL LOW (ref >60)
GFR calc non Af Amer: 17 mL/min — ABNORMAL LOW (ref >60)
Glucose, Bld: 106 mg/dL — ABNORMAL HIGH (ref 65–99)
Potassium: 3.9 mmol/L (ref 3.5–5.1)
Sodium: 130 mmol/L — ABNORMAL LOW (ref 135–145)

## 2015-10-26 MED ORDER — BISACODYL 5 MG PO TBEC
10.0000 mg | DELAYED_RELEASE_TABLET | Freq: Every day | ORAL | Status: DC | PRN
  Filled 2015-10-26: qty 2

## 2015-10-26 MED ORDER — BISACODYL 10 MG RE SUPP
10.0000 mg | Freq: Every day | RECTAL | Status: DC | PRN
  Administered 2015-10-29: 10 mg via RECTAL
  Filled 2015-10-26: qty 1

## 2015-10-26 NOTE — Care Management Important Message (Signed)
Important Message  Patient Details  Name: Tonya Payne MRN: [id number] Date of Birth: 09-20-1934   Medicare Important Message Given:  Yes    Darrold SpanWebster, Segundo Makela Hall, RN 10/26/2015, 11:00 AM

## 2015-10-26 NOTE — Progress Notes (Signed)
CARDIAC REHAB PHASE I   PRE:  Rate/Rhythm: 77 SR    BP: sitting 90/52    SaO2: 95 RA  MODE:  Ambulation: 120 ft   POST:  Rate/Rhythm: 109 ST    BP: sitting 111/51     SaO2: 98 RA  We came earlier and pt was smiling, eating breakfast, more alert. When we returned she was much more sleepy, grimaced look on her face but agreeable to walk. She could not move hips forward in recliner but stood with min assist x2, gait belt. Pt walked with rollator, tends to push rollator too far ahead, struggled to stand tall. Increased distance today with x2 sitting rests. Continued with grimaced face however sts she is fine, no c/o. Return to recliner on chair alarm. Will continue to follow. [id number]   Harriet MassonRandi Kristan Lyndell Payne CES, ACSM 10/26/2015 9:32 AM

## 2015-10-26 NOTE — Progress Notes (Signed)
Patient ambulated 100 ft with rollator with moderate assist x2, pushes rollator too far ahead and unable to control speed. Patient struggles to stand tall and needs constant reminder to stand tall and look forward. Tolerated poor. Patient refuses to sit in recliner and wants to go back to bed. After educating patient she continues to refuse and goes back to bed. Call bell with reach. Will continue to monitor.  Valinda HoarLexie Isam Unrein RN

## 2015-10-26 NOTE — Progress Notes (Signed)
Occupational Therapy Treatment Patient Details Name: Tonya AlmSuann Garceau MRN: [id number] DOB: 1934/09/22 Today's Date: 10/26/2015    History of present illness Pt admit with STEMI.  Pt had CABG x3.    OT comments  Pt limited by lethargy this session and required max verbal cues for sternal precautions. Pt completed stand-pivot transfer to chair with mod assist. Pt agreeable to sit up in chair while eating dinner. Will continue to follow acutely.   Follow Up Recommendations  SNF    Equipment Recommendations  Other (comment) (TBD at next venue)    Recommendations for Other Services      Precautions / Restrictions Precautions Precautions: Fall;Sternal Precaution Comments: Pt unable to recall any sternal precautions at beginning of session or end of session even after reviewing.       Mobility Bed Mobility Overal bed mobility: Needs Assistance Bed Mobility: Supine to Sit     Supine to sit: Max assist;HOB elevated     General bed mobility comments: Assist for trunk support to come to sitting position, to pivot and scoot hips to EOB using bed pad. VCs for initiation and hand placement.  Transfers Overall transfer level: Needs assistance Equipment used: 1 person hand held assist Transfers: Sit to/from [employer] CorporationStand;Stand Pivot Transfers Sit to Stand: Mod assist Stand pivot transfers: Mod assist       General transfer comment: Assist for boost to stand, balance upon standing, and hand held assist to chair. VCs for hand placement, sternal precautions, and safety throughout.    Balance Overall balance assessment: Needs assistance Sitting-balance support: No upper extremity supported;Feet supported Sitting balance-[name]: Poor Sitting balance - Comments: posterior lean with min assist to maintain safe upright position Postural control: Posterior lean Standing balance support: Bilateral upper extremity supported;During functional activity Standing balance-[name]: Poor Standing  balance comment: Heavy reliance on UE support for balance                   ADL Overall ADL's : Needs assistance/impaired Eating/Feeding: Set up;Sitting               Upper Body Dressing : Minimal assistance;Sitting   Lower Body Dressing: Maximal assistance;Sit to/from stand Lower Body Dressing Details (indicate cue type and reason): assist for sit-stand, and reach/pull up clothing Toilet Transfer: Cueing for safety;Stand-pivot;BSC;Maximal assistance Toilet Transfer Details (indicate cue type and reason): simulated to chair Toileting- Clothing Manipulation and Hygiene: Maximal assistance;Sit to/from stand       Functional mobility during ADLs: Maximal assistance;Rolling walker General ADL Comments: Increased lethargy      Vision                     Perception     Praxis      Cognition   Behavior During Therapy: Flat affect Overall Cognitive Status: Impaired/Different from baseline Area of Impairment: Memory;Following commands;Safety/judgement;Problem solving;Attention   Current Attention Level: Focused Memory: Decreased short-term memory  Following Commands: Follows one step commands with increased time Safety/Judgement: Decreased awareness of safety;Decreased awareness of deficits   Problem Solving: Difficulty sequencing;Requires verbal cues;Requires tactile cues;Decreased initiation General Comments: Pt very lethargic during session. Unable to recall any sternal precautions and instead said "I just can't fall."     Extremity/Trunk Assessment               Exercises     Shoulder Instructions       General Comments      Pertinent Vitals/ Pain       Pain  Assessment: Faces Faces Pain Scale: Hurts even more Pain Location: incision site Pain Descriptors / Indicators: Grimacing;Guarding Pain Intervention(s): Limited activity within patient's tolerance;Monitored during session;Repositioned  Home Living                                           Prior Functioning/Environment              Frequency Min 2X/week     Progress Toward Goals  OT Goals(current goals can now be found in the care plan section)  Progress towards OT goals: Progressing toward goals  Acute Rehab OT Goals Patient Stated Goal: to get better OT Goal Formulation: With patient Time For Goal Achievement: 11/01/15 Potential to Achieve Goals: Good ADL Goals Pt Will Perform Grooming: standing;with supervision Pt Will Perform Lower Body Dressing: sit to/from stand;with supervision Pt Will Transfer to Toilet: with supervision;ambulating;bedside commode Pt Will Perform Toileting - Clothing Manipulation and hygiene: with supervision;sit to/from stand Additional ADL Goal #1: Pt will be S for in and OOB for basic ADLs Additional ADL Goal #2: Pt will be aware of and follow sternal precautions durings session  Plan Discharge plan remains appropriate    Co-evaluation                 End of Session Equipment Utilized During Treatment: Gait belt;Other (comment) (sternal pillow)   Activity Tolerance Patient limited by lethargy   Patient Left in chair;with call bell/phone within reach;with chair alarm set   Nurse Communication Mobility status        Time: [id number] OT Time Calculation (min): 13 min  Charges: OT General Charges $OT Visit: 1 Procedure OT Treatments $Self Care/Home Management : 8-22 mins  Nils PyleJulia Quandre Polinski, OTR/L Pager: [phone] 10/26/2015, 5:17 PM

## 2015-10-26 NOTE — Consult Note (Signed)
Measurements made yesterday.  Pt is candidate for [employer] excluder graft.  Will plan repair of aneurysm in about a month.  Creatinine still trending up most likely contrast nephropathy hopefully improve over the next few days  Fabienne Brunsharles Fields, MD [facility] [place] Office: [phone] Pager: [phone]

## 2015-10-26 NOTE — Progress Notes (Addendum)
[address]       [place] [id number]             [phone]      10 Days Post-Op Procedure(s) (LRB): CORONARY ARTERY BYPASS GRAFTING (CABG)  x three, using left internal mammary artery and right leg greater saphenous vein harvested endoscopically (N/A) TRANSESOPHAGEAL ECHOCARDIOGRAM (TEE) (N/A) Subjective: Feels much better today , had a lot of diarrhea yesterday, none since 8 pm last night .   Objective: Vital signs in last 24 hours: Temp:  [98.5 F (36.9 C)-98.9 F (37.2 C)] 98.5 F (36.9 C) (06/22 0437) Pulse Rate:  [72-100] 72 (06/22 0437) Cardiac Rhythm:  [-] Normal sinus rhythm (06/22 0437) Resp:  [18-20] 18 (06/22 0437) BP: (105-152)/(55-65) 112/61 mmHg (06/22 0437) SpO2:  [94 %-97 %] 94 % (06/22 0437) Weight:  [108 lb 14.5 oz (49.4 kg)] 108 lb 14.5 oz (49.4 kg) (06/22 0438)  Hemodynamic parameters for last 24 hours:    Intake/Output from previous day: 06/21 0701 - 06/22 0700 In: 340 [P.O.:340] Out: -  Intake/Output this shift:    Physical Exam  Constitutional:  Looks much more alert and active today  Cardiovascular: Normal rate, regular rhythm and normal heart sounds.   Pulmonary/Chest:  Dim mildly in left base   Abdominal: Soft. There is tenderness.  Mild tto over aneurysm  Neurological: She is alert.  Skin: Skin is warm and dry.    Lab Results: No results for input(s): WBC, HGB, HCT, PLT in the last 72 hours. BMET:  Recent Labs  10/25/15 0320 10/26/15 0315  NA 130* 130*  K 4.2 3.9  CL 93* 91*  CO2 26 26  GLUCOSE 114* 106*  BUN 34* 38*  CREATININE 2.04* 2.58*  CALCIUM 8.9 9.3    PT/INR: No results for input(s): LABPROT, INR in the last 72 hours. ABG    Component Value Date/Time   PHART 7.323* 10/16/2015 1826   HCO3 21.0 10/16/2015 1826   TCO2 22 10/17/2015 1606   ACIDBASEDEF 5.0* 10/16/2015 1826   O2SAT 97.0 10/16/2015 1826   CBG (last 3)  No results for input(s): GLUCAP in the last 72  hours.  Meds Scheduled Meds: . ALPRAZolam  0.25 mg Oral QHS  . amiodarone  200 mg Oral Daily  . aspirin EC  325 mg Oral Daily   Or  . aspirin  324 mg Per Tube Daily  . atorvastatin  40 mg Oral q1800  . bisacodyl  10 mg Oral Daily   Or  . bisacodyl  10 mg Rectal Daily  . docusate sodium  200 mg Oral Daily  . enoxaparin (LOVENOX) injection  30 mg Subcutaneous QHS  . feeding supplement (ENSURE ENLIVE)  237 mL Oral BID BM  . furosemide  40 mg Oral Daily  . metoprolol tartrate  25 mg Oral BID  . mirtazapine  15 mg Oral QHS  . OLANZapine  5 mg Oral QHS  . pantoprazole  40 mg Oral Daily  . potassium chloride  20 mEq Oral Daily  . sertraline  50 mg Oral q morning - 10a  . sodium chloride flush  3 mL Intravenous Q12H   Continuous Infusions: . sodium chloride 10 mL/hr at 10/24/15 0821   PRN Meds:.sodium chloride, acetaminophen, alum & mag hydroxide-simeth, guaiFENesin-dextromethorphan, ipratropium-albuterol, magnesium hydroxide, ondansetron (ZOFRAN) IV, phenazopyridine, sodium chloride flush, traMADol, white petrolatum  Xrays No results found.  Assessment/Plan: S/P Procedure(s) (LRB): CORONARY ARTERY BYPASS GRAFTING (CABG)  x three, using  left internal mammary artery and right leg greater saphenous vein harvested endoscopically (N/A) TRANSESOPHAGEAL ECHOCARDIOGRAM (TEE) (N/A)  1 doing better but renal fxn still showing rising creat. May be a component also of dehydration with the contrast nephropathy  as she had frequent diarrhea yesterday. Encouraged to drink plenty of fluids. D/C lasix 2 she looks much more alert and active today- push rehab, SNF at D/C   LOS: 20 days    GOLD,WAYNE E 10/26/2015  Patient seen and examined, agree with above Did better with cardiac rehab Diarrhea last night but none today Creatinine up again- lasix stopped, will give IVF x 24 hours  Viviann SpareSteven C. Dorris FetchHendrickson, MD [facility] [phone]

## 2015-10-27 ENCOUNTER — Telehealth: Payer: Self-pay | Admitting: Vascular Surgery

## 2015-10-27 LAB — BASIC METABOLIC PANEL
ANION GAP: 9 (ref 5–15)
BUN: 35 mg/dL — ABNORMAL HIGH (ref 6–20)
CALCIUM: 8.7 mg/dL — AB (ref 8.9–10.3)
CO2: 25 mmol/L (ref 22–32)
Chloride: 92 mmol/L — ABNORMAL LOW (ref 101–111)
Creatinine, Ser: 2.66 mg/dL — ABNORMAL HIGH (ref 0.44–1.00)
GFR, EST AFRICAN AMERICAN: 19 mL/min — AB (ref >60)
GFR, EST NON AFRICAN AMERICAN: 16 mL/min — AB (ref >60)
GLUCOSE: 101 mg/dL — AB (ref 65–99)
Potassium: 3.7 mmol/L (ref 3.5–5.1)
Sodium: 126 mmol/L — ABNORMAL LOW (ref 135–145)

## 2015-10-27 LAB — CBC
HCT: 27.2 % — ABNORMAL LOW (ref 36.0–46.0)
Hemoglobin: 8.7 g/dL — ABNORMAL LOW (ref 12.0–15.0)
MCH: 28.8 pg (ref 26.0–34.0)
MCHC: 32 g/dL (ref 30.0–36.0)
MCV: 90.1 fL (ref 78.0–100.0)
PLATELETS: 521 10*3/uL — AB (ref 150–400)
RBC: 3.02 MIL/uL — ABNORMAL LOW (ref 3.87–5.11)
RDW: 14.4 % (ref 11.5–15.5)
WBC: 11.7 10*3/uL — AB (ref 4.0–10.5)

## 2015-10-27 MED ORDER — ENSURE ENLIVE PO LIQD
237.0000 mL | Freq: Three times a day (TID) | ORAL | Status: DC
  Administered 2015-10-27 – 2015-10-29 (×4): 237 mL via ORAL

## 2015-10-27 NOTE — Progress Notes (Signed)
Pt refused to walk tonight when asked.  Will try to encourage ambulation in the AM.  Pt resting in bed with call bell in reach.  Will continue to monitor. Harriet Massonavidson, Dayan Kreis E, RN

## 2015-10-27 NOTE — Progress Notes (Signed)
Physical Therapy Treatment Patient Details Name: Tonya Payne MRN: [id number] DOB: Nov 19, 1934 Today's Date: 10/27/2015    History of Present Illness Pt admit with STEMI.  Pt had CABG x3.     PT Comments    Pt admitted with above diagnosis. Pt currently with functional limitations due to balance and endurance deficits. Pt was able to ambulate much better when ambulating with bil HHA.  Pt initially was needing incr rest breaks with rollator.  Once, PT and OT held pts hands to ambulate, pt did much better and incr distance with ambulation.  Daughter present and was encouraging pt as well.   Pt will benefit from skilled PT to increase their independence and safety with mobility to allow discharge to the venue listed below.    Follow Up Recommendations  SNF;Supervision/Assistance - 24 hour     Equipment Recommendations  Other (comment) (TBA)    Recommendations for Other Services       Precautions / Restrictions Precautions Precautions: Fall;Sternal Precaution Comments: Pt unable to recall any sternal precautions at beginning of session or end of session even after reviewing.    Mobility  Bed Mobility Overal bed mobility: Needs Assistance Bed Mobility: Supine to Sit     Supine to sit: Mod assist;HOB elevated     General bed mobility comments: Max VCs for encouragement and initiation. Assist to move legs off bed and for trunk support to come to sitting position.  Transfers Overall transfer level: Needs assistance Equipment used: 4-wheeled walker;2 person hand held assist Transfers: Sit to/from Stand Sit to Stand: Mod assist;+2 safety/equipment         General transfer comment: Pt required mod assist to stand due to pt self limiting behavior as well as cues for sternal precautions.  Had to remind pt to hug pillow to follow sternal precautions. Pt with heavy posterior lean needing cues and assist to lean forward.    Ambulation/Gait Ambulation/Gait assistance: Mod assist;+2  safety/equipment Ambulation Distance (Feet): 150 Feet (30 feet, 20 feet, 25 feet, 75 feet) Assistive device: 4-wheeled walker;2 person hand held assist Gait Pattern/deviations: Step-to pattern;Decreased step length - right;Decreased step length - left;Decreased stride length;Shuffle;Trunk flexed;Wide base of support;Staggering right;Staggering left Gait velocity: decreased Gait velocity interpretation: Below normal speed for age/gender General Gait Details: Ambulated 4675' with rollator and required 3 rest breaks with 1L of O2 [place] and SaO2 at 97%. Pt walked an additional 4775' back to room with 2 person hand held assist with no rest breaks and no supplemental O2 and SaO2 remained at 92%. Pt required max cues for encouragement, proper use of rollator, to look forward and for safe hand placement during transfers   Stairs            Wheelchair Mobility    Modified Rankin (Stroke Patients Only)       Balance Overall balance assessment: Needs assistance Sitting-balance support: No upper extremity supported;Feet supported Sitting balance-[name]: Poor Sitting balance - Comments: posterior lean, min guard assist Postural control: Posterior lean Standing balance support: Bilateral upper extremity supported;During functional activity Standing balance-[name]: Poor Standing balance comment: relies on UEs for balance                    Cognition Arousal/Alertness: Awake/alert Behavior During Therapy: Flat affect Overall Cognitive Status: Impaired/Different from baseline Area of Impairment: Memory;Following commands;Problem solving;Safety/judgement   Current Attention Level: Focused Memory: Decreased short-term memory Following Commands: Follows one step commands with increased time Safety/Judgement: Decreased awareness of safety;Decreased awareness  of deficits   Problem Solving: Difficulty sequencing;Requires verbal cues;Requires tactile cues;Decreased initiation       Exercises General Exercises - Lower Extremity Long Arc Quad: AROM;Both;10 reps;Seated Hip Flexion/Marching: AROM;Both;10 reps;Seated    General Comments        Pertinent Vitals/Pain Pain Assessment: Faces Faces Pain Scale: Hurts even more Pain Location: LEs Pain Descriptors / Indicators: Grimacing;Guarding Pain Intervention(s): Limited activity within patient's tolerance;Monitored during session;Repositioned  VSS    Home Living                      Prior Function            PT Goals (current goals can now be found in the care plan section) Acute Rehab PT Goals Patient Stated Goal: to get better Progress towards PT goals: Progressing toward goals    Frequency  Min 3X/week    PT Plan Current plan remains appropriate    Co-evaluation PT/OT/SLP Co-Evaluation/Treatment: Yes Reason for Co-Treatment: For patient/therapist safety PT goals addressed during session: Mobility/safety with mobility OT goals addressed during session: ADL's and self-care;Proper use of Adaptive equipment and DME;Strengthening/ROM     End of Session Equipment Utilized During Treatment: Gait belt Activity Tolerance: Patient limited by fatigue Patient left: in chair;with call bell/phone within reach;with family/visitor present     Time: 1335-1417 PT Time Calculation (min) (ACUTE ONLY): 42 min  Charges:  $Gait Training: 8-22 mins $Therapeutic Exercise: 8-22 mins                    G CodesTawni Millers:      Baley Shands F 10/27/2015, 4:17 PM Entergy CorporationDawn Carrera Kiesel,PT Acute Rehabilitation [phone] (pager)

## 2015-10-27 NOTE — Clinical Social Work Note (Signed)
Patient has a bed with [facility] when patient is medically stable. CSW continuing to follow for support and discharge needs.  Valero EnergyShonny Lace Chenevert, LCSW [phone]

## 2015-10-27 NOTE — Progress Notes (Signed)
Patient anxious and shaking. Diversional activities attempt, patient refused. PA notified. Ordered to give night time dose of Xanax early. Patient wheeled around unit, returned to bed. Call bell within reach. Will continue to monitor.   Valinda HoarLexie Sadey Yandell RN

## 2015-10-27 NOTE — Progress Notes (Signed)
CARDIAC REHAB PHASE I   PRE:  Rate/Rhythm: 93 SR    BP: sitting 155/80    SaO2: 93 RA  MODE:  Ambulation: 20 ft   POST:  Rate/Rhythm: 100 ST    BP: sitting 140/69     SaO2: 91-92 RA  Pt declined ambulation earlier in the morning. Pt would not self-motivate to get out of bed, would not turn herself. Necessitated us moving her from lying to EOB which she c/o significant pain in sternum. Mod assist to stand with gait belt. Used rollator, gait belt, assist x2. Pushes rollator too far in front of her, does not stand upright. Gave reminders to pt regarding this and I held the rollator closer to her to control her speed. After 10 ft in hall pt exclaimed she had to sit, "I'm tired!". Poor mechanics turning and sitting on rollator seat. Pt is very small therefore easy to hold. She would fall without assist. After sitting and resting 3 min, pt insisted on returning to room, despite max encouragement and coaxing to increase distance like she had yesterday. Continued poor stability/mechanics getting to recliner. Needs max verbal cues and support with gait belt. Feel pts anxiety plays large role. PT to see this pm. [phone]   Harriet Massonandi Kristan Samrat Hayward CES, ACSM 10/27/2015 11:50 AM

## 2015-10-27 NOTE — Telephone Encounter (Signed)
LVM re appt 11/30/15 12:30pm Mailed letter.

## 2015-10-27 NOTE — Progress Notes (Signed)
Occupational Therapy Treatment Patient Details Name: Tonya Payne MRN: [id number] DOB: [birth year] Today's Date: 10/27/2015    History of present illness Pt admit with STEMI.  Pt had CABG x3.    OT comments  Pt making slow progress, but continues to be limited by fatigue. Pt performed better with hand-held assist for ambulation instead of rollator - updated cardiac rehab to try this with the hope that pt is able to participate more. Trialed 1L of O2 [place] while ambulating as pt's SaO2 was 87-92% on RA and pt appeared more alert with supplemental O2 and reported feeling less SOB. Will continue to follow acutely.   Follow Up Recommendations  SNF    Equipment Recommendations  Other (comment) (TBD at next venue)    Recommendations for Other Services      Precautions / Restrictions Precautions Precautions: Fall;Sternal       Mobility Bed Mobility Overal bed mobility: Needs Assistance Bed Mobility: Supine to Sit     Supine to sit: Mod assist;HOB elevated     General bed mobility comments: Max VCs for encouragement and initiation. Assist to move legs off bed and for trunk support to come to sitting position.  Transfers Overall transfer level: Needs assistance Equipment used: 4-wheeled walker;2 person hand held assist Transfers: Sit to/from Stand Sit to Stand: Mod assist;+2 safety/equipment         General transfer comment: Ambulated 4275' with rollator and required 3 rest breaks with 1L of O2 [place] and SaO2 at 97%. Pt walked an additional 8675' back to room with 2 person hand held assist with no rest breaks and no supplemental O2 and SaO2 remained at 92%. Pt required max cues for encouragement, proper use of rollator, to look forward and for safe hand placement during transfers.     Balance Overall balance assessment: Needs assistance Sitting-balance support: No upper extremity supported;Feet supported Sitting balance-[name]: Fair Sitting balance - Comments: posterior lean, min  guard assist Postural control: Posterior lean Standing balance support: Bilateral upper extremity supported;During functional activity Standing balance-[name]: Poor Standing balance comment: reliant on UE support for balance                   ADL Overall ADL's : Needs assistance/impaired     Grooming: Brushing hair;Min guard;Sitting   Upper Body Bathing: Minimal assitance;Sitting   Lower Body Bathing: Moderate assistance;Sit to/from stand   Upper Body Dressing : Minimal assistance;Sitting   Lower Body Dressing: Moderate assistance;Sit to/from stand   Toilet Transfer: Moderate assistance;Cueing for safety;Ambulation;BSC;RW   Toileting- Clothing Manipulation and Hygiene: Moderate assistance;Sit to/from stand                Vision                     Perception     Praxis      Cognition   Behavior During Therapy: Flat affect Overall Cognitive Status: Impaired/Different from baseline Area of Impairment: Memory;Following commands;Problem solving;Safety/judgement     Memory: Decreased short-term memory  Following Commands: Follows one step commands with increased time Safety/Judgement: Decreased awareness of safety;Decreased awareness of deficits   Problem Solving: Difficulty sequencing;Requires verbal cues;Requires tactile cues;Decreased initiation      Extremity/Trunk Assessment               Exercises     Shoulder Instructions       General Comments      Pertinent Vitals/ Pain       Pain  Assessment: Faces Faces Pain Scale: Hurts even more Pain Location: legs Pain Descriptors / Indicators: Grimacing;Guarding Pain Intervention(s): Limited activity within patient's tolerance;Monitored during session;Repositioned  Home Living                                          Prior Functioning/Environment              Frequency Min 2X/week     Progress Toward Goals  OT Goals(current goals can now be found  in the care plan section)  Progress towards OT goals: Progressing toward goals  Acute Rehab OT Goals Patient Stated Goal: to get better OT Goal Formulation: With patient Time For Goal Achievement: 11/01/15 Potential to Achieve Goals: Good ADL Goals Pt Will Perform Grooming: standing;with supervision Pt Will Perform Lower Body Dressing: sit to/from stand;with supervision Pt Will Transfer to Toilet: with supervision;ambulating;bedside commode Pt Will Perform Toileting - Clothing Manipulation and hygiene: with supervision;sit to/from stand Additional ADL Goal #1: Pt will be S for in and OOB for basic ADLs Additional ADL Goal #2: Pt will be aware of and follow sternal precautions durings session  Plan Discharge plan remains appropriate    Co-evaluation    PT/OT/SLP Co-Evaluation/Treatment: Yes Reason for Co-Treatment: For patient/therapist safety   OT goals addressed during session: ADL's and self-care;Proper use of Adaptive equipment and DME;Strengthening/ROM      End of Session Equipment Utilized During Treatment: Gait belt;Other (comment);Oxygen (rollator)   Activity Tolerance Patient limited by fatigue   Patient Left in chair;with call bell/phone within reach;with family/visitor present   Nurse Communication Mobility status        Time: 1335-1418 OT Time Calculation (min): 43 min  Charges: OT General Charges $OT Visit: 1 Procedure OT Treatments $Self Care/Home Management : 8-22 mins  Nils PyleJulia Kimbree Casanas, OTR/L Pager: [phone] 10/27/2015, 2:42 PM

## 2015-10-27 NOTE — Progress Notes (Signed)
Utilization review completed.  

## 2015-10-27 NOTE — Progress Notes (Addendum)
[address]       [place] [id number]             [phone]      11 Days Post-Op Procedure(s) (LRB): CORONARY ARTERY BYPASS GRAFTING (CABG)  x three, using left internal mammary artery and right leg greater saphenous vein harvested endoscopically (N/A) TRANSESOPHAGEAL ECHOCARDIOGRAM (TEE) (N/A) Subjective: Feels about the same, slow improvement overall  Objective: Vital signs in last 24 hours: Temp:  [97.9 F (36.6 C)-98.5 F (36.9 C)] 97.9 F (36.6 C) (06/23 0501) Pulse Rate:  [70-96] 70 (06/23 0501) Cardiac Rhythm:  [-] Normal sinus rhythm (06/22 2021) Resp:  [18-20] 20 (06/23 0501) BP: (102-133)/(51-73) 133/60 mmHg (06/23 0501) SpO2:  [83 %-95 %] 95 % (06/23 0501) Weight:  [108 lb 9.6 oz (49.261 kg)] 108 lb 9.6 oz (49.261 kg) (06/23 0501)  Hemodynamic parameters for last 24 hours:    Intake/Output from previous day: 06/22 0701 - 06/23 0700 In: 243 [P.O.:240; I.V.:3] Out: -  Intake/Output this shift:    General appearance: alert, cooperative and no distress Heart: regular rate and rhythm Lungs: clear to auscultation bilaterally Abdomen: soft, non-dist, min tenderness Extremities: no edema Wound: incis healing well  Lab Results:  Recent Labs  10/27/15 0359  WBC 11.7*  HGB 8.7*  HCT 27.2*  PLT 521*   BMET:  Recent Labs  10/26/15 0315 10/27/15 0359  NA 130* 126*  K 3.9 3.7  CL 91* 92*  CO2 26 25  GLUCOSE 106* 101*  BUN 38* 35*  CREATININE 2.58* 2.66*  CALCIUM 9.3 8.7*    PT/INR: No results for input(s): LABPROT, INR in the last 72 hours. ABG    Component Value Date/Time   PHART 7.323* 10/16/2015 1826   HCO3 21.0 10/16/2015 1826   TCO2 22 10/17/2015 1606   ACIDBASEDEF 5.0* 10/16/2015 1826   O2SAT 97.0 10/16/2015 1826   CBG (last 3)  No results for input(s): GLUCAP in the last 72 hours.  Meds Scheduled Meds: . ALPRAZolam  0.25 mg Oral QHS  . amiodarone  200 mg Oral Daily  . aspirin EC  325 mg Oral Daily   Or    . aspirin  324 mg Per Tube Daily  . atorvastatin  40 mg Oral q1800  . enoxaparin (LOVENOX) injection  30 mg Subcutaneous QHS  . feeding supplement (ENSURE ENLIVE)  237 mL Oral BID BM  . metoprolol tartrate  25 mg Oral BID  . mirtazapine  15 mg Oral QHS  . OLANZapine  5 mg Oral QHS  . pantoprazole  40 mg Oral Daily  . sertraline  50 mg Oral q morning - 10a  . sodium chloride flush  3 mL Intravenous Q12H   Continuous Infusions:  PRN Meds:.sodium chloride, acetaminophen, alum & mag hydroxide-simeth, bisacodyl **OR** bisacodyl, guaiFENesin-dextromethorphan, ipratropium-albuterol, magnesium hydroxide, ondansetron (ZOFRAN) IV, phenazopyridine, sodium chloride flush, traMADol, white petrolatum  Xrays No results found.  Assessment/Plan: S/P Procedure(s) (LRB): CORONARY ARTERY BYPASS GRAFTING (CABG)  x three, using left internal mammary artery and right leg greater saphenous vein harvested endoscopically (N/A) TRANSESOPHAGEAL ECHOCARDIOGRAM (TEE) (N/A)  1 stable , it appears creat may be peaking . Sodium decreased, was on 1/2 NS - stopped now. Lasix has been stopped. May need nephrology consult.  2 cont to push rehab as able- she is getting a little stronger, SNF at D/C  3 hemodyn stable in sinus rhythm 4 no more diarrhea   LOS: 21 days    GOLD,WAYNE  E 10/27/2015  Patient seen and examined, agree with above Her creatinine appears to be leveling off and should begin to recover over the next day or two. Will hold off on nephrology consult unless renal function further deteriorates To SNF once renal issues resolved  Viviann SpareSteven C. Dorris FetchHendrickson, MD [facility] [phone]

## 2015-10-27 NOTE — Progress Notes (Signed)
PT Cancellation Note  Patient Details Name: Tonya Payne MRN: [id number] DOB: May 07, 1934   Cancelled Treatment:    Reason Eval/Treat Not Completed: Other (comment) (Refused PT this am due to fatigue. Will return in pm. )   Narissa Beaufort F 10/27/2015, 10:06 AM  Eber Jonesawn Kenley Troop,PT Acute Rehabilitation [phone] (pager)

## 2015-10-28 LAB — BASIC METABOLIC PANEL
ANION GAP: 10 (ref 5–15)
BUN: 33 mg/dL — ABNORMAL HIGH (ref 6–20)
CALCIUM: 9 mg/dL (ref 8.9–10.3)
CO2: 26 mmol/L (ref 22–32)
CREATININE: 2.39 mg/dL — AB (ref 0.44–1.00)
Chloride: 93 mmol/L — ABNORMAL LOW (ref 101–111)
GFR, EST AFRICAN AMERICAN: 21 mL/min — AB (ref >60)
GFR, EST NON AFRICAN AMERICAN: 18 mL/min — AB (ref >60)
GLUCOSE: 104 mg/dL — AB (ref 65–99)
Potassium: 3.6 mmol/L (ref 3.5–5.1)
Sodium: 129 mmol/L — ABNORMAL LOW (ref 135–145)

## 2015-10-28 LAB — CBC
HCT: 27.6 % — ABNORMAL LOW (ref 36.0–46.0)
HEMOGLOBIN: 9.2 g/dL — AB (ref 12.0–15.0)
MCH: 30.4 pg (ref 26.0–34.0)
MCHC: 33.3 g/dL (ref 30.0–36.0)
MCV: 91.1 fL (ref 78.0–100.0)
PLATELETS: 570 10*3/uL — AB (ref 150–400)
RBC: 3.03 MIL/uL — ABNORMAL LOW (ref 3.87–5.11)
RDW: 14.4 % (ref 11.5–15.5)
WBC: 11.9 10*3/uL — ABNORMAL HIGH (ref 4.0–10.5)

## 2015-10-28 MED ORDER — POTASSIUM CHLORIDE CRYS ER 20 MEQ PO TBCR
20.0000 meq | EXTENDED_RELEASE_TABLET | Freq: Once | ORAL | Status: AC
  Administered 2015-10-28: 20 meq via ORAL
  Filled 2015-10-28: qty 1

## 2015-10-28 NOTE — Progress Notes (Signed)
1545 Cardiac Rehab Attempted to get pt to ambulate. She declines saying that she is not feeling well and that she is not able to walk. Strongly encouraged pt to try but was unsuccessful. Pt states that she has been in the recliner today. When questioned pt about not feeling well, states she is nauseated. Will continue to follow pt. Reported to RN. Melina CopaLisa Kennia Vanvorst RN

## 2015-10-28 NOTE — Progress Notes (Addendum)
    [address]       [place] [address]             [phone]        12 Days Post-Op Procedure(s) (LRB): CORONARY ARTERY BYPASS GRAFTING (CABG)  x three, using left internal mammary artery and right leg greater saphenous vein harvested endoscopically (N/A) TRANSESOPHAGEAL ECHOCARDIOGRAM (TEE) (N/A)  Subjective: Patient somnolent but arousable this am.  Objective: Vital signs in last 24 hours: Temp:  [97.5 F (36.4 C)-98.1 F (36.7 C)] 98.1 F (36.7 C) (06/24 0438) Pulse Rate:  [65-81] 65 (06/24 0438) Cardiac Rhythm:  [-] Normal sinus rhythm (06/24 0711) Resp:  [17-18] 18 (06/24 0438) BP: (98-125)/(45-65) 98/45 mmHg (06/24 0438) SpO2:  [94 %] 94 % (06/24 0438) Weight:  [107 lb 1.6 oz (48.58 kg)] 107 lb 1.6 oz (48.58 kg) (06/24 0438)  Pre op weight 47 kg Current Weight  10/28/15 107 lb 1.6 oz (48.58 kg)      Intake/Output from previous day: 06/23 0701 - 06/24 0700 In: 390 [P.O.:390] Out: 300 [Urine:300]   Physical Exam:  Cardiovascular: RRR Pulmonary: Clear to auscultation bilaterally; no rales, wheezes, or rhonchi. Abdomen: Soft, non tender, bowel sounds present. Extremities: No lower extremity edema. Wounds: Clean and dry.  No erythema or signs of infection.  Lab Results: CBC: Recent Labs  10/27/15 0359 10/28/15 0406  WBC 11.7* 11.9*  HGB 8.7* 9.2*  HCT 27.2* 27.6*  PLT 521* 570*   BMET:  Recent Labs  10/27/15 0359 10/28/15 0406  NA 126* 129*  K 3.7 3.6  CL 92* 93*  CO2 25 26  GLUCOSE 101* 104*  BUN 35* 33*  CREATININE 2.66* 2.39*  CALCIUM 8.7* 9.0    PT/INR:  Lab Results  Component Value Date   INR 1.51* 10/16/2015   INR 1.07 10/15/2015   INR 1.02 10/06/2015   ABG:  INR: Will add last result for INR, ABG once components are confirmed Will add last 4 CBG results once components are confirmed  Assessment/Plan:  1. CV - S/p STEMI. NSR in the 70's. On Amiodarone 200 mg daily, Lopressor 25 mg bid. 2.   Pulmonary - On room air. Encourage incentive spirometer 3.AKI-creatinine down from 2.66 to 2.39. Will recheck in am 4.  Acute blood loss anemia - H and H up to 9.2 and 27.6 5. Hyponatremia-sodium up to 129 6. Gently supplement potassium 7. Will need SNF when ready for discharge-possibly Monday  ZIMMERMAN,DONIELLE MPA-C 10/28/2015,8:28 AM  Abdomen feels better Acute Kidney Injury (any one)  Increase in SCr by > 0.3 within 48 hours  Increase SCr to > 1.5 times baseline  Urine volume < 0.5 ml/kg/h for 6 hrs  ?Stage 1 - Increase in serum creatinine to 1.5 to 1.9 times baseline, or increase in serum creatinine by ?0.3 mg/dL (?26.5 micromol/L), or reduction in urine output to <0.5 mL/kg per hour for 6 to 12 hours.  ?Stage 2 - Increase in serum creatinine to 2.0 to 2.9 times baseline, or reduction in urine output to <0.5 mL/kg per hour for ?12 hours.  ?Stage 3 - Increase in serum creatinine to 3.0 times baseline, or increase in serum creatinine to ?4.0 mg/dL (?353.6 micromol/L), or reduction in urine output to <0.3 mL/kg per hour for ?24 hours, or anuria for ?12 hours, or the initiation of renal replacement therapy, or, in patients <18 years, decrease in eGFR to <35 mL   Lab Results  Component Value Date   CREATININE   2.39* 10/28/2015   Estimated Creatinine Clearance: 13.6 mL/min (by C-G formula based on Cr of 2.39).    Renal function improving after CT of abdomen with contrast snf placement Monday I have seen and examined Tonya Payne and agree with the above assessment  and plan.  Grace Isaac MD Beeper [phone] Office [phone] 10/28/2015 9:29 AM

## 2015-10-29 LAB — BASIC METABOLIC PANEL
Anion gap: 10 (ref 5–15)
BUN: 30 mg/dL — AB (ref 6–20)
CO2: 25 mmol/L (ref 22–32)
Calcium: 9 mg/dL (ref 8.9–10.3)
Chloride: 96 mmol/L — ABNORMAL LOW (ref 101–111)
Creatinine, Ser: 2.27 mg/dL — ABNORMAL HIGH (ref 0.44–1.00)
GFR calc Af Amer: 22 mL/min — ABNORMAL LOW (ref >60)
GFR, EST NON AFRICAN AMERICAN: 19 mL/min — AB (ref >60)
Glucose, Bld: 100 mg/dL — ABNORMAL HIGH (ref 65–99)
POTASSIUM: 4 mmol/L (ref 3.5–5.1)
SODIUM: 131 mmol/L — AB (ref 135–145)

## 2015-10-29 NOTE — Progress Notes (Addendum)
      [address]       [place] [id number]             [phone]        13 Days Post-Op Procedure(s) (LRB): CORONARY ARTERY BYPASS GRAFTING (CABG)  x three, using left internal mammary artery and right leg greater saphenous vein harvested endoscopically (N/A) TRANSESOPHAGEAL ECHOCARDIOGRAM (TEE) (N/A)  Subjective: Patient very alert this am. She is about to eat breakfast. No specific complaints. She hopes to be discharged tomorrow.  Objective: Vital signs in last 24 hours: Temp:  [97.6 F (36.4 C)-98 F (36.7 C)] 97.6 F (36.4 C) (06/25 0615) Pulse Rate:  [68-83] 77 (06/25 0615) Cardiac Rhythm:  [-] Normal sinus rhythm (06/25 0711) Resp:  [18] 18 (06/25 0615) BP: (97-128)/(46-68) 126/68 mmHg (06/25 0615) SpO2:  [94 %] 94 % (06/25 0615) Weight:  [103 lb 12.8 oz (47.083 kg)] 103 lb 12.8 oz (47.083 kg) (06/25 0615)  Pre op weight 47 kg Current Weight  10/29/15 103 lb 12.8 oz (47.083 kg)      Intake/Output from previous day: 06/24 0701 - 06/25 0700 In: 480 [P.O.:480] Out: -    Physical Exam:  Cardiovascular: RRR Pulmonary: Clear to auscultation bilaterally Abdomen: Soft, non tender, bowel sounds present. Extremities: No lower extremity edema. Wounds: Clean and dry.  No erythema or signs of infection.  Lab Results: CBC:  Recent Labs  10/27/15 0359 10/28/15 0406  WBC 11.7* 11.9*  HGB 8.7* 9.2*  HCT 27.2* 27.6*  PLT 521* 570*   BMET:   Recent Labs  10/28/15 0406 10/29/15 0240  NA 129* 131*  K 3.6 4.0  CL 93* 96*  CO2 26 25  GLUCOSE 104* 100*  BUN 33* 30*  CREATININE 2.39* 2.27*  CALCIUM 9.0 9.0    PT/INR:  Lab Results  Component Value Date   INR 1.51* 10/16/2015   INR 1.07 10/15/2015   INR 1.02 10/06/2015   ABG:  INR: Will add last result for INR, ABG once components are confirmed Will add last 4 CBG results once components are confirmed  Assessment/Plan:  1. CV - S/p STEMI. NSR in the 70's. On Amiodarone 200 mg  daily, Lopressor 25 mg bid. 2.  Pulmonary - On room air. Encourage incentive spirometer 3.AKI-creatinine down from 2.39 to 2.27. Will recheck in am 4.  Acute blood loss anemia - H and H up to 9.2 and 27.6 5. Hyponatremia-sodium up to 131 6. Needs to ambulate and participate with cardiac rehab-we discussed the importance of this. 7. Will need SNF when ready for discharge-likely Monday  ZIMMERMAN,DONIELLE MPA-C 10/29/2015,8:27 AM    Lab Results  Component Value Date   CREATININE 2.27* 10/29/2015   Estimated Creatinine Clearance: 14.3 mL/min (by C-G formula based on Cr of 2.27).    [facility] I have seen and examined Julio AlmSuann Llerena and agree with the above assessment  and plan.  Delight OvensEdward B Kerolos Nehme MD Beeper [phone] Office [phone] 10/29/2015 9:43 AM

## 2015-10-29 NOTE — Progress Notes (Signed)
Pt ambulated 15 feet to restroom and then 50 feet in hall. Pt ambulated with rolling Ailea Rhatigan and required breaks to sit every 15 feet. Pt fatigued after walk and returned to bed. Pt required encouragement and education on sternal precautions.   Leonidas Rombergaitlin S Bumbledare, RN

## 2015-10-29 NOTE — Discharge Instructions (Signed)
We ask the SNF to please do the following: °1. Please obtain vital signs at least one time daily °2.Please weigh the patient daily. If he or she continues to gain weight or develops lower extremity edema, contact the office at [phone]. °3. Ambulate patient at least three times daily and please use sternal precautions. ° ° °Endoscopic Saphenous Vein Harvesting, Care After °Refer to this sheet in the next few weeks. These instructions provide you with information on caring for yourself after your procedure. Your health care provider may also give you more specific instructions. Your treatment has been planned according to current medical practices, but problems sometimes occur. Call your health care provider if you have any problems or questions after your procedure. °HOME CARE INSTRUCTIONS °Medicine °· Take whatever pain medicine your surgeon prescribes. Follow the directions carefully. Do not take over-the-counter pain medicine unless your surgeon says it is okay. Some pain medicine can cause bleeding problems for several weeks after surgery. °· Follow your surgeon's instructions about driving. You will probably not be permitted to drive after heart surgery. °· Take any medicines your surgeon prescribes. Any medicines you took before your heart surgery should be checked with your health care provider before you start taking them again. °Wound care °· If your surgeon has prescribed an elastic bandage or stocking, ask how long you should wear it. °· Check the area around your surgical cuts (incisions) whenever your bandages (dressings) are changed. Look for any redness or swelling. °· You will need to return to have the stitches (sutures) or staples taken out. Ask your surgeon when to do that. °· Ask your surgeon when you can shower or bathe. °Activity °· Try to keep your legs raised when you are sitting. °· Do any exercises your health care providers have given you. These may include deep breathing exercises,  coughing, walking, or other exercises. °SEEK MEDICAL CARE IF: °· You have any questions about your medicines. °· You have more leg pain, especially if your pain medicine stops working. °· New or growing bruises develop on your leg. °· Your leg swells, feels tight, or becomes red. °· You have numbness in your leg. °SEEK IMMEDIATE MEDICAL CARE IF: °· Your pain gets much worse. °· Blood or fluid leaks from any of the incisions. °· Your incisions become warm, swollen, or red. °· You have chest pain. °· You have trouble breathing. °· You have a fever. °· You have more pain near your leg incision. °MAKE SURE YOU: °· Understand these instructions. °· Will watch your condition. °· Will get help right away if you are not doing well or get worse. °  °This information is not intended to replace advice given to you by your health care provider. Make sure you discuss any questions you have with your health care provider. °  °Document Released: 01/02/2011 Document Revised: 05/13/2014 Document Reviewed: 01/02/2011 °Elsevier Interactive Patient Education ©2016 [employer] °Coronary Artery Bypass Grafting, Care After °These instructions give you information on caring for yourself after your procedure. Your doctor may also give you more specific instructions. Call your doctor if you have any problems or questions after your procedure.  °HOME CARE °· Only take medicine as told by your doctor. Take medicines exactly as told. Do not stop taking medicines or start any new medicines without talking to your doctor first. °· Take your pulse as told by your doctor. °· Do deep breathing as told by your doctor. Use your breathing device (incentive spirometer), if given,   to practice deep breathing several times a day. Support your chest with a pillow or your arms when you take deep breaths or cough. °· Keep the area clean, dry, and protected where the surgery cuts (incisions) were made. Remove bandages (dressings) only as told by your doctor.  If strips were applied to surgical area, do not take them off. They fall off on their own. °· Check the surgery area daily for puffiness (swelling), redness, or leaking fluid. °· If surgery cuts were made in your legs: °· Avoid crossing your legs. °· Avoid sitting for long periods of time. Change positions every 30 minutes. °· Raise your legs when you are sitting. Place them on pillows. °· Wear stockings that help keep blood clots from forming in your legs (compression stockings). °· Only take sponge baths until your doctor says it is okay to take showers. Pat the surgery area dry. Do not rub the surgery area with a washcloth or towel. Do not bathe, swim, or use a hot tub until your doctor says it is okay. °· Eat foods that are high in fiber. These include raw fruits and vegetables, whole grains, beans, and nuts. Choose lean meats. Avoid canned, processed, and fried foods. °· Drink enough fluids to keep your pee (urine) clear or pale yellow. °· Weigh yourself every day. °· Rest and limit activity as told by your doctor. You may be told to: °· Stop any activity if you have chest pain, shortness of breath, changes in heartbeat, or dizziness. Get help right away if this happens. °· Move around often for short amounts of time or take short walks as told by your doctor. Gradually become more active. You may need help to strengthen your muscles and build endurance. °· Avoid lifting, pushing, or pulling anything heavier than 10 pounds (4.5 kg) for at least 6 weeks after surgery. °· Do not drive until your doctor says it is okay. °· Ask your doctor when you can go back to work. °· Ask your doctor when you can begin sexual activity again. °· Follow up with your doctor as told. °GET HELP IF: °· You have puffiness, redness, more pain, or fluid draining from the incision site. °· You have a fever. °· You have puffiness in your ankles or legs. °· You have pain in your legs. °· You gain 2 or more pounds (0.9 kg) a day. °· You  feel sick to your stomach (nauseous) or throw up (vomit). °· You have watery poop (diarrhea). °GET HELP RIGHT AWAY IF: °· You have chest pain that goes to your jaw or arms. °· You have shortness of breath. °· You have a fast or irregular heartbeat. °· You notice a "clicking" in your breastbone when you move. °· You have numbness or weakness in your arms or legs. °· You feel dizzy or light-headed. °MAKE SURE YOU: °· Understand these instructions. °· Will watch your condition. °· Will get help right away if you are not doing well or get worse. °  °This information is not intended to replace advice given to you by your health care provider. Make sure you discuss any questions you have with your health care provider. °  °Document Released: 04/27/2013 Document Reviewed: 04/27/2013 °Elsevier Interactive Patient Education ©2016 Elsevier Inc. ° °

## 2015-10-30 ENCOUNTER — Telehealth: Payer: Self-pay | Admitting: Physician Assistant

## 2015-10-30 LAB — BASIC METABOLIC PANEL
ANION GAP: 11 (ref 5–15)
BUN: 29 mg/dL — ABNORMAL HIGH (ref 6–20)
CALCIUM: 9.4 mg/dL (ref 8.9–10.3)
CO2: 26 mmol/L (ref 22–32)
Chloride: 96 mmol/L — ABNORMAL LOW (ref 101–111)
Creatinine, Ser: 2.22 mg/dL — ABNORMAL HIGH (ref 0.44–1.00)
GFR calc Af Amer: 23 mL/min — ABNORMAL LOW (ref >60)
GFR calc non Af Amer: 20 mL/min — ABNORMAL LOW (ref >60)
Glucose, Bld: 117 mg/dL — ABNORMAL HIGH (ref 65–99)
POTASSIUM: 4 mmol/L (ref 3.5–5.1)
SODIUM: 133 mmol/L — AB (ref 135–145)

## 2015-10-30 MED ORDER — ATORVASTATIN CALCIUM 40 MG PO TABS: 40.0000 mg | ORAL_TABLET | Freq: Every day | ORAL | Status: DC

## 2015-10-30 MED ORDER — ENSURE ENLIVE PO LIQD
237.0000 mL | Freq: Three times a day (TID) | ORAL | Status: DC
Start: 2015-10-30 — End: 2015-10-31

## 2015-10-30 MED ORDER — TRAMADOL HCL 50 MG PO TABS: 50.0000 mg | ORAL_TABLET | Freq: Two times a day (BID) | ORAL | Status: DC | PRN

## 2015-10-30 MED ORDER — ASPIRIN 325 MG PO TBEC: 325.0000 mg | DELAYED_RELEASE_TABLET | Freq: Every day | ORAL | Status: DC

## 2015-10-30 MED ORDER — METOPROLOL TARTRATE 25 MG PO TABS: 25.0000 mg | ORAL_TABLET | Freq: Two times a day (BID) | ORAL | Status: DC

## 2015-10-30 NOTE — Progress Notes (Signed)
PT Cancellation Note  Patient Details Name: Tonya Payne MRN: [id number] DOB: [birth year]   Cancelled [name]:    Reason Eval/Treat Not Completed: Fatigue/lethargy limiting ability to participate; patient reports up most of the night with diarrhea due to multiple laxatives.  Feels too fatigued to walk this morning.  Reports d/c to [facility]a [facility] today.  Encouraged further participation in rehab efforts there to return to independent.  Patient sounds determined to do so.  Had refused breakfast so got her vanilla ensure.     [name] 10/30/2015, 9:35 AM  [name], PT [phone] 10/30/2015

## 2015-10-30 NOTE — Telephone Encounter (Signed)
10/30/2015 Received [facility]ed [facility] for upcoming appointment with Theodore Demarkhonda Barrett, PA-C on 11/09/2015.  [facility]

## 2015-10-30 NOTE — Progress Notes (Signed)
      [address]       [place] [id number]             [phone]      14 Days Post-Op Procedure(s) (LRB): CORONARY ARTERY BYPASS GRAFTING (CABG)  x three, using left internal mammary artery and right leg greater saphenous vein harvested endoscopically (N/A) TRANSESOPHAGEAL ECHOCARDIOGRAM (TEE) (N/A) Subjective: laxativees gave her diarrhea yesterday, she thinks is resolved  Objective: Vital signs in last 24 hours: Temp:  [97.6 F (36.4 C)-98.4 F (36.9 C)] 97.6 F (36.4 C) (06/26 0414) Pulse Rate:  [71-87] 81 (06/26 0414) Cardiac Rhythm:  [-] Normal sinus rhythm (06/25 1950) Resp:  [18] 18 (06/26 0414) BP: (111-134)/(59-74) 132/74 mmHg (06/26 0414) SpO2:  [94 %-100 %] 100 % (06/26 0414) Weight:  [103 lb 6.4 oz (46.902 kg)] 103 lb 6.4 oz (46.902 kg) (06/26 0414)  Hemodynamic parameters for last 24 hours:    Intake/Output from previous day: 06/25 0701 - 06/26 0700 In: 480 [P.O.:480] Out: -  Intake/Output this shift:    General appearance: alert, cooperative, fatigued and no distress Heart: regular rate and rhythm Lungs: clear to auscultation bilaterally Abdomen: benign Extremities: no edema Wound: incis healing well  Lab Results:  Recent Labs  10/28/15 0406  WBC 11.9*  HGB 9.2*  HCT 27.6*  PLT 570*   BMET:  Recent Labs  10/29/15 0240 10/30/15 0238  NA 131* 133*  K 4.0 4.0  CL 96* 96*  CO2 25 26  GLUCOSE 100* 117*  BUN 30* 29*  CREATININE 2.27* 2.22*  CALCIUM 9.0 9.4    PT/INR: No results for input(s): LABPROT, INR in the last 72 hours. ABG    Component Value Date/Time   PHART 7.323* 10/16/2015 1826   HCO3 21.0 10/16/2015 1826   TCO2 22 10/17/2015 1606   ACIDBASEDEF 5.0* 10/16/2015 1826   O2SAT 97.0 10/16/2015 1826   CBG (last 3)  No results for input(s): GLUCAP in the last 72 hours.  Meds Scheduled Meds: . ALPRAZolam  0.25 mg Oral QHS  . amiodarone  200 mg Oral Daily  . aspirin EC  325 mg Oral Daily   Or  .  aspirin  324 mg Per Tube Daily  . atorvastatin  40 mg Oral q1800  . enoxaparin (LOVENOX) injection  30 mg Subcutaneous QHS  . feeding supplement (ENSURE ENLIVE)  237 mL Oral TID BM  . metoprolol tartrate  25 mg Oral BID  . mirtazapine  15 mg Oral QHS  . OLANZapine  5 mg Oral QHS  . pantoprazole  40 mg Oral Daily  . sertraline  50 mg Oral q morning - 10a  . sodium chloride flush  3 mL Intravenous Q12H   Continuous Infusions:  PRN Meds:.sodium chloride, acetaminophen, alum & mag hydroxide-simeth, bisacodyl **OR** bisacodyl, guaiFENesin-dextromethorphan, ipratropium-albuterol, magnesium hydroxide, ondansetron (ZOFRAN) IV, phenazopyridine, sodium chloride flush, traMADol, white petrolatum  Xrays No results found.  Assessment/Plan: S/P Procedure(s) (LRB): CORONARY ARTERY BYPASS GRAFTING (CABG)  x three, using left internal mammary artery and right leg greater saphenous vein harvested endoscopically (N/A) TRANSESOPHAGEAL ECHOCARDIOGRAM (TEE) (N/A) Plan for discharge: see discharge orders QTc persist >600 will stop amio  Renal fxn conts to improve To SNF  LOS: 24 days    GOLD,WAYNE E 10/30/2015

## 2015-10-30 NOTE — Clinical Social Work Placement (Signed)
   CLINICAL SOCIAL WORK PLACEMENT  NOTE  Date:  10/30/2015  Patient Details  Name: Tonya Payne MRN: [id number] Date of Birth: [birth year]  Clinical Social Work is seeking post-discharge placement for this patient at the Skilled  Nursing Facility level of care (*CSW will initial, date and re-position this form in  chart as items are completed):  Yes   Patient/family provided with [facility] Clinical Social Work Department's list of facilities offering this level of care within the geographic area requested by the patient (or if unable, by the patient's family).  Yes   Patient/family informed of their freedom to choose among providers that offer the needed level of care, that participate in Medicare, Medicaid or managed care program needed by the patient, have an available bed and are willing to accept the patient.  Yes   Patient/family informed of [facility]'s ownership interest in [facility] and [facility], as well as of the fact that they are under no obligation to receive care at these facilities.  PASRR submitted to EDS on 10/19/15     PASRR number received on 10/19/15     Existing PASRR number confirmed on       FL2 transmitted to all facilities in geographic area requested by pt/family on 10/19/15     FL2 transmitted to all facilities within larger geographic area on       Patient informed that his/her managed care company has contracts with or will negotiate with certain facilities, including the following:        Yes   Patient/family informed of bed offers received.  [facility]     Physician recommends and patient chooses bed at      [facility] 10[facility].  Patient to be transferred to facility by Ambulance     Patient family notified on 10/30/15 of transfer.  Name of family member notified:  Hagman,Timothy D.     PHYSICIAN Please prepare priority discharge summary, including medications, Please  prepare prescriptions, Please sign FL2     Additional Comment:  Per MD [facility]. RN, patient, patient's family, and facility notified of discharge. RN given phone number for report and transport packet is on patient's chart. Ambulance transport requested. CSW signing off.   _______________________________________________ Reggy EyeLaShonda A Selita Staiger, LCSW 10/30/2015, 12:30 PM

## 2015-10-30 NOTE — Care Management Important Message (Signed)
Important Message  Patient Details  Name: Julio AlmSuann Nier MRN: [id number] Date of Birth: [birth year]   Medicare Important Message Given:  Yes    Kyla BalzarineShealy, Oakes Mccready Abena 10/30/2015, 10:37 AM

## 2015-10-30 NOTE — Progress Notes (Signed)
[id number] Pt appears more alert today and able to understand ed and answer teachback. Discussed importance of IS and walking to keep lungs clear. Gave modified ex ed and encouraged IS 10X an hour. Gave heart healthy diet. Pt stated she has quit smoking for over 6 months and does not plan to restart. Congratulations given on this. Discussed CRP 2 and will refer to [place] CRP 2. Put on discharge video for pt to view. Discussed sternal precautions. PT has pt on schedule today so will let them walk prior to discharge if time permits. Luetta Nuttingharlene Kaynen Minner RN BSN 10/30/2015 8:55 AM

## 2015-10-30 NOTE — Progress Notes (Signed)
[facility]. All questions answered, given phone number in case additional questions come up.   Leonidas Rombergaitlin S Bumbledare, RN

## 2015-10-30 NOTE — Care Management Note (Signed)
Case Management Note Previous CM note initiated by Raynald BlendSamantha Claxton RN, CM  Patient Details  Name: Tonya AlmSuann Callaway MRN: [id number] Date of Birth: 1934/07/05  Subjective/Objective:      Admitted with Code STEMI              Action/Plan:  PTA - [place] [facility] [place].  Pending CABG 10/13/15 .  Daughter in law at bedside; pt doesn't have recommended supervision post  tentative CABG discharge - per daughter in law;  Pt has small room and no one else can fit in for overnights and her home has steps that pt can not travel to get to bedrooms.  CM will continue to follow for discharge needs.      Expected Discharge Date:    10/30/15              Expected Discharge Plan:  Skilled Nursing Facility  In-House Referral:  Clinical Social Work  Discharge planning Services  CM Consult  Post Acute Care Choice:    Choice offered to:     DME Arranged:    DME Agency:     HH Arranged:    HH Agency:     Status of Service:  Completed, signed off  Medicare Important Message Given:  Yes Date Medicare IM Given:    Medicare IM give by:    Date Additional Medicare IM Given:    Additional Medicare Important Message give by:     If discussed at Long Length of Stay Meetings, dates discussed:    Additional Comments:  10/30/15- 1015- Donn PieriniKristi Brownie Nehme RN, BSN- pt stable for d/c to SNF today- CSW following for placement- [facility].   CSW consulted for SNF placement  10/17/15 Pt had CABG 10/16/15 - CM requested PT/OT consult   Darrold SpanWebster, Langley Flatley Hall, RN 10/30/2015, 10:16 AM [phone]

## 2015-10-30 NOTE — Progress Notes (Signed)
Attempted report [facility]. Asked to call back in 30 minutes.   IV removed. Telemetry removed, CCMD notified. Transport scheduled by SW for 12pm.   Leonidas Rombergaitlin S Bumbledare, RN

## 2015-10-30 NOTE — Progress Notes (Signed)
Pt encouraged to walk but refuses. States will try in the AM. Will encourage in AM. Pt is resting. Call bell within reach. Will continue to monitor.  Amra Shukla, RN

## 2015-10-31 ENCOUNTER — Encounter: Payer: Self-pay | Admitting: Adult Health

## 2015-10-31 ENCOUNTER — Non-Acute Institutional Stay (SKILLED_NURSING_FACILITY): Payer: Medicare Other | Admitting: Adult Health

## 2015-10-31 DIAGNOSIS — E785 Hyperlipidemia, unspecified: Secondary | ICD-10-CM

## 2015-10-31 DIAGNOSIS — I25119 Atherosclerotic heart disease of native coronary artery with unspecified angina pectoris: Secondary | ICD-10-CM

## 2015-10-31 DIAGNOSIS — I714 Abdominal aortic aneurysm, without rupture, unspecified: Secondary | ICD-10-CM

## 2015-10-31 DIAGNOSIS — F329 Major depressive disorder, single episode, unspecified: Secondary | ICD-10-CM

## 2015-10-31 DIAGNOSIS — N179 Acute kidney failure, unspecified: Secondary | ICD-10-CM

## 2015-10-31 DIAGNOSIS — I2102 ST elevation (STEMI) myocardial infarction involving left anterior descending coronary artery: Secondary | ICD-10-CM | POA: Diagnosis not present

## 2015-10-31 DIAGNOSIS — I4891 Unspecified atrial fibrillation: Secondary | ICD-10-CM

## 2015-10-31 DIAGNOSIS — D72829 Elevated white blood cell count, unspecified: Secondary | ICD-10-CM

## 2015-10-31 DIAGNOSIS — Z951 Presence of aortocoronary bypass graft: Secondary | ICD-10-CM

## 2015-10-31 DIAGNOSIS — J449 Chronic obstructive pulmonary disease, unspecified: Secondary | ICD-10-CM | POA: Diagnosis not present

## 2015-10-31 DIAGNOSIS — D62 Acute posthemorrhagic anemia: Secondary | ICD-10-CM | POA: Diagnosis not present

## 2015-10-31 DIAGNOSIS — R5381 Other malaise: Secondary | ICD-10-CM | POA: Diagnosis not present

## 2015-10-31 DIAGNOSIS — F32A Depression, unspecified: Secondary | ICD-10-CM

## 2015-10-31 NOTE — Progress Notes (Signed)
Patient ID: Tonya Payne, female   DOB: 09-Aug-1934, 81 y.o.   MRN: [id number]    DATE:  10/31/2015   MRN:  [id number]  BIRTHDAY: 1934/06/11  Facility:  Nursing Home Location:  [facility]  Nursing Home Room Number: [id number]  LEVEL OF CARE:  SNF (31)  Contact Information    Name Relation Home Work [place] Daughter [phone]  [phone]   Hagman,Timothy D. Son   [phone]       Code Status History    Date Active Date Inactive Code Status Order ID Comments User Context   10/06/2015  4:49 PM 10/16/2015 12:28 PM Full Code [id number]  Leone Brand, NP Inpatient   02/25/2015  8:32 PM 02/28/2015 12:29 PM Full Code [id number]  Marlon Pel, PA-C ED   07/09/2013 11:21 AM 07/09/2013  8:06 PM Full Code [id number]  Joya Gaskins, MD ED    Advance Directive Documentation        Most Recent Value   Type of Advance Directive  Out of facility DNR (pink MOST or yellow form)   Pre-existing out of facility DNR order (yellow form or pink MOST form)     "MOST" Form in Place?         Chief Complaint  Patient presents with  . Hospitalization Follow-up    HISTORY OF PRESENT ILLNESS:   This is a 81 year old female who has been admitted to [facility] on 10/30/15 from [facility]. She has PMH of Tobacco abuse, COPD, TIA, IBS, depression and anxiety. She was treated in the hospital for STEMI and CAD. She was having chest pain and EMS brought her to the hospital. She had ST elevation in V4-6. Chest pain did not resolve with SL NTG. She was taken to cath lab and found to have severe disease in the LAD. Angioplasty was performed but there was incomplete expansion and a stent was not placed. She has a 75% left main stenosis, multiple calcified lesions in the LAD and a 50% stenosis in a large dominant LAD. The circumflex is a very small vessel. Consultation with cardiothoracic with Dr. Charlett Lango and recommended to proceed with coronary artery  surgical revascularization.  She had CABG X 3 , LIMA to LAD, SVG to diagonal and SVG to RCA and was place on ASA/statin/beta blocker/IV nitroglycerin/heparin drips and Brilinta. She was also found to have dilated cardiomyopathy. She had ejection fraction 20-25%. She had AKI due to dye exposure but creatinine improved to baseline with hydration. She was transfused 2 units packed RBC due to expected acute blood loss anemia postop. She had postop atrial fibrillation which has been treated with amiodarone and beta blocker. She is now maintaining sinus rhythm. She has a 5 cm abdominal aneurysm and was felt to be stable in size with no evidence of dissection or rupture which will be followed up as an outpatient for tentative stent graft placement in approximately 6 weeks.  She has been admitted for a short-term rehabilitation.   PAST MEDICAL HISTORY:  Past Medical History  Diagnosis Date  . COPD (chronic obstructive pulmonary disease) (HCC)   . Anxiety   . Depression   . TIA (transient ischemic attack)   . IBS (irritable bowel syndrome)   . Oral thrush   . CAD (coronary artery disease), native coronary artery 10/08/2015  . Congestive dilated cardiomyopathy (HCC) 10/08/2015  . Hyperlipidemia LDL goal <70 10/08/2015  . Abdominal aneurysm (HCC)     CTA 10/23/2015  .  Post PTCA      CURRENT MEDICATIONS: Reviewed  Patient's Medications  New Prescriptions   No medications on file  Previous Medications   ASPIRIN EC 325 MG EC TABLET    Take 1 tablet (325 mg total) by mouth daily.   ATORVASTATIN (LIPITOR) 40 MG TABLET    Take 1 tablet (40 mg total) by mouth daily at 6 PM.   FE FUM-FE POLY-VIT C-LACTOBAC (FUSION PO)    Take 1 tablet by mouth daily.   METOPROLOL TARTRATE (LOPRESSOR) 25 MG TABLET    Take 1 tablet (25 mg total) by mouth 2 (two) times daily.   NAPROXEN SODIUM (ALEVE) 220 MG TABLET    Take 440 mg by mouth 2 (two) times daily as needed (for headaches or pain).   OLANZAPINE (ZYPREXA) 5 MG TABLET     Take 5 mg by mouth at bedtime.   PHENAZOPYRIDINE (PYRIDIUM) 200 MG TABLET    Take 200 mg by mouth 3 (three) times daily as needed for pain.   SERTRALINE (ZOLOFT) 50 MG TABLET    Take 50 mg by mouth every morning.   TRAMADOL (ULTRAM) 50 MG TABLET    Take 1 tablet (50 mg total) by mouth every 12 (twelve) hours as needed for moderate pain.   UNABLE TO FIND    Med Name: MedPass 120 mL TID  Modified Medications   No medications on file  Discontinued Medications   FEEDING SUPPLEMENT, ENSURE ENLIVE, (ENSURE ENLIVE) LIQD    Take 237 mLs by mouth 3 (three) times daily between meals.     No Known Allergies   REVIEW OF SYSTEMS:  GENERAL: no change in appetite, no fatigue, no weight changes, no fever, chills or weakness EYES: Denies change in vision, dry eyes, eye pain, itching or discharge EARS: Denies change in hearing, ringing in ears, or earache NOSE: Denies nasal congestion or epistaxis MOUTH and THROAT: Denies oral discomfort, gingival pain or bleeding, pain from teeth or hoarseness   RESPIRATORY: no cough, SOB, DOE, wheezing, hemoptysis CARDIAC: no chest pain, edema or palpitations GI: no abdominal pain, diarrhea, constipation, heart burn, nausea or vomiting GU: Denies dysuria, frequency, hematuria, incontinence, or discharge PSYCHIATRIC: Denies feeling of depression or anxiety. No report of hallucinations, insomnia, paranoia, or agitation     PHYSICAL EXAMINATION  GENERAL APPEARANCE: Well nourished. In no acute distress. Normal body habitus SKIN:  Midline chest surgical incision glued, dry and no erythema has 2 incisions and epigastric area covered with Steri-Strips, dry and no erythema HEAD: Normal in size and contour. No evidence of trauma EYES: Lids open and close normally. No blepharitis, entropion or ectropion. PERRL. Conjunctivae are clear and sclerae are white. Lenses are without opacity EARS: Pinnae are normal. Patient hears normal voice tunes of the examiner MOUTH and  THROAT: Lips are without lesions. Oral mucosa is moist and without lesions. Tongue is normal in shape, size, and color and without lesions NECK: supple, trachea midline, no neck masses, no thyroid tenderness, no thyromegaly LYMPHATICS: no LAN in the neck, no supraclavicular LAN RESPIRATORY: breathing is even & unlabored, BS CTAB CARDIAC: RRR, no murmur,no extra heart sounds, no edema GI: abdomen soft, normal BS, no masses, no tenderness, no hepatomegaly, no splenomegaly EXTREMITIES:  Able to move 4 extremities PSYCHIATRIC: Alert and oriented X 3. Affect and behavior are appropriate  LABS/RADIOLOGY: Labs reviewed: Basic Metabolic Panel:  Recent Labs  16/10/96 1823  10/17/15 0426 10/17/15 1605  10/28/15 0406 10/29/15 0240 10/30/15 0238  NA  --   < >  138  --   < > 129* 131* 133*  K  --   < > 4.6  --   < > 3.6 4.0 4.0  CL  --   < > 109  --   < > 93* 96* 96*  CO2  --   --  20*  --   < > 26 25 26   GLUCOSE  --   < > 135*  --   < > 104* 100* 117*  BUN  --   < > 10  --   < > 33* 30* 29*  CREATININE 0.96  < > 1.18* 1.60*  < > 2.39* 2.27* 2.22*  CALCIUM  --   --  8.2*  --   < > 9.0 9.0 9.4  MG 2.7*  --  2.4 2.2  --   --   --   --   < > = values in this interval not displayed. Liver Function Tests:  Recent Labs  02/25/15 1850 10/06/15 1506  AST 29 30  ALT 6* 14  ALKPHOS 49 63  BILITOT 0.9 0.9  PROT 6.1* 7.0  ALBUMIN 3.4* 3.6   CBC:  Recent Labs  02/25/15 1850  10/06/15 1506  10/21/15 0300 10/27/15 0359 10/28/15 0406  WBC 9.7  --  10.3  < > 9.4 11.7* 11.9*  NEUTROABS 7.1  --  7.4  --   --   --   --   HGB 13.2  < > 11.9*  < > 10.7* 8.7* 9.2*  HCT 37.1  < > 36.3  < > 33.8* 27.2* 27.6*  MCV 95.1  --  94.0  < > 91.1 90.1 91.1  PLT 276  --  330  < > 277 521* 570*  < > = values in this interval not displayed.  Lipid Panel:  Recent Labs  10/06/15 1506 10/07/15 0326  HDL 107 85   Cardiac Enzymes:  Recent Labs  10/07/15 0326 10/07/15 0924 10/07/15 1619   TROPONINI 3.22* 3.27* 2.67*   CBG:  Recent Labs  10/21/15 1705 10/22/15 0559 10/22/15 1358  GLUCAP 124* 97 133*     Dg Chest 2 View  10/21/2015  CLINICAL DATA:  Atelectasis EXAM: CHEST  2 VIEW COMPARISON:  Chest radiograph from one day prior. FINDINGS: Surgical hardware from ACDF overlies the lower cervical spine. Sternotomy wires appear aligned and intact. CABG clips overlie the mediastinum. Stable cardiomediastinal silhouette with mild cardiomegaly. No pneumothorax. Small stable bilateral pleural effusions and bibasilar atelectasis. No pulmonary edema. IMPRESSION: 1. Stable mild cardiomegaly without pulmonary edema. 2. Stable small bilateral pleural effusions and bibasilar atelectasis. Electronically Signed   By: Delbert Phenix M.D.   On: 10/21/2015 10:13   Dg Chest 2 View  10/14/2015  CLINICAL DATA:  81 year old female with a history of left-sided chest pain and shortness of breath EXAM: CHEST  2 VIEW COMPARISON:  10/06/2015, 08/17/2014 FINDINGS: Cardiomediastinal silhouette borderline enlarged. Stigmata of emphysema, with increased retrosternal airspace, flattened hemidiaphragms, increased AP diameter, and hyperinflation on the AP view. Architectural distortion at the lung bases subpleural location on the lateral view. This is more pronounced than on the comparison of 08/17/2014, relatively unchanged from recent comparison. Interstitial opacity superior to the minor fissure on the right is unchanged from 2016 and the most recent comparison. No pneumothorax. No pleural effusion. Atherosclerosis. Surgical changes of the cervical region. IMPRESSION: Chronic changes of advanced emphysema and architectural distortion, most pronounced at the bilateral lung bases. Difficult to exclude a superimposed infection. Signed,  Yvone Neu. Loreta Ave, DO [facility] Electronically Signed   By: Gilmer Mor D.O.   On: 10/14/2015 09:46   US Aorta  10/09/2015   CLINICAL DATA:  Aortic aneurysm, follow-up. Recently scratch the previous tobacco abuse. EXAM: ULTRASOUND OF ABDOMINAL AORTA TECHNIQUE: Ultrasound examination of the abdominal aorta was performed to evaluate for abdominal aortic aneurysm. COMPARISON:  CT 08/17/2014 and previous FINDINGS: Abdominal Aorta Distal fusiform dilatation to maximum AP diameter of 5 cm (stable since prior CT) extending over length of at least 8 cm. Right common iliac artery measures 11 mm diameter, left 9 mm. IMPRESSION: 1. Stable 5 cm distal abdominal aortic fusiform aneurysm. Recommend followup by abdomen and pelvis CTA in 3-6 months, and vascular surgery referral/consultation if not already obtained. This recommendation follows ACR consensus guidelines: White Paper of the ACR Incidental Findings Committee II on Vascular Findings. J Am Coll Radiol 2013; 10:789-794. Electronically Signed   By: [name] M.D.   On: 10/09/2015 16:18   Dg Chest Port 1 View  10/20/2015  CLINICAL DATA:  Shortness of breath. EXAM: PORTABLE CHEST 1 VIEW COMPARISON:  10/19/2015. FINDINGS: Prior CABG. Cardiomegaly. Persistent but improving bilateral from interstitial prominence and bilateral pleural effusions consistent with improved congestive heart failure. No pneumothorax. Prior cervical spine fusion . IMPRESSION: Prior CABG.  Persistent but improving congestive heart failure. Electronically Signed   By: Maisie Fus  Register   On: 10/20/2015 08:09   Dg Chest Port 1 View  10/19/2015  CLINICAL DATA:  Status post CABG EXAM: PORTABLE CHEST 1 VIEW COMPARISON:  Chest radiograph from one day prior. FINDINGS: Right internal jugular central venous sheath terminates in the right brachiocephalic vein. Sternotomy wires appear aligned and intact. CABG clips overlie the mediastinum. Epicardial pacer leads overlie the heart. Partially visualized surgical hardware from ACDF overlies the lower cervical spine. Stable cardiomediastinal silhouette with mild cardiomegaly. No  pneumothorax. Small bilateral pleural effusions, slightly decreased bilaterally. Stable mild pulmonary edema. Patchy bibasilar lung opacities appear slightly decreased bilaterally. IMPRESSION: 1. Stable mild cardiomegaly and mild pulmonary edema . 2. Small bilateral pleural effusions, slightly decreased bilaterally. 3. Patchy bibasilar lung opacities, slightly decreased bilaterally, favor atelectasis. Electronically Signed   By: Delbert Phenix M.D.   On: 10/19/2015 08:13   Dg Chest Port 1 View  10/18/2015  CLINICAL DATA:  Shortness of Breath EXAM: PORTABLE CHEST 1 VIEW COMPARISON:  10/17/2015 FINDINGS: Cardiac shadow is stable. Interval removal of Swan-Ganz catheter, mediastinal drain and thoracostomy catheter is noted. No pneumothorax is seen. Right jugular sheath remains. Bibasilar infiltrates are noted increased from the prior exam. Right-sided pleural effusion is noted as well. No acute bony abnormality is seen. IMPRESSION: Interval removal of tubes and lines. Increasing bibasilar infiltrates and right-sided effusion. Electronically Signed   By: Alcide Clever M.D.   On: 10/18/2015 07:54   Dg Chest Port 1 View  10/17/2015  CLINICAL DATA:  CABG.  Chest tube placement. EXAM: PORTABLE CHEST 1 VIEW COMPARISON:  10/16/2015. FINDINGS: Interim extubation removal of NG tube. Bilateral chest tubes and Swan-Ganz catheter in stable position. Prior CABG. Cardiomegaly with diffuse bilateral from interstitial prominence and small bilateral pleural effusions consistent congestive heart failure. Low lung volumes. Tiny left apical pneumothorax noted. Prior cervical spine fusion. IMPRESSION: 1. Interim extubation and removal of NG tube. Swan-Ganz catheter and bilateral chest tubes in stable position. Tiny left apical pneumothorax noted. 2. Prior CABG. Cardiomegaly with bilateral pulmonary interstitial prominence and bilateral pleural effusions consistent with congestive heart failure. Critical  Value/emergent results were called  by telephone at the time of interpretation on 10/17/2015 at 7:28 am to nurse Herbert SetaHeather, who verbally acknowledged these results. Electronically Signed   By: Maisie Fushomas  Register   On: 10/17/2015 07:30   Dg Chest Port 1 View  10/16/2015  CLINICAL DATA:  Status post coronary artery bypass graft x3. EXAM: PORTABLE CHEST 1 VIEW COMPARISON:  October 14, 2015. FINDINGS: Left-sided chest tube is noted without pneumothorax. Mild right basilar subsegmental atelectasis is noted. Endotracheal tube is projected over tracheal air shadow with distal tip 8 cm above the carina. Nasogastric tube is seen entering the stomach. Right internal jugular Swan-Ganz catheter is noted with tip in expected position of main pulmonary artery. Bony thorax is unremarkable. IMPRESSION: Endotracheal and nasogastric tubes in grossly good position. Left-sided chest tube is noted without evidence of pneumothorax. Electronically Signed   By: Lupita RaiderJames  Green Jr, M.D.   On: 10/16/2015 12:56   Dg Chest Port 1 View  10/06/2015  CLINICAL DATA:  Post cardiac catheterization EXAM: PORTABLE CHEST 1 VIEW COMPARISON:  08/17/2014 FINDINGS: Mild hyperinflation of the lungs. Heart is borderline enlarged. Biapical pleural thickening. Scarring in the lung bases. No acute airspace opacities or effusions. No acute bony abnormality. No pneumothorax. IMPRESSION: COPD/chronic changes.  Borderline heart size. Electronically Signed   By: Charlett NoseKevin  Dover M.D.   On: 10/06/2015 19:24   Ct Angio Abd/pel W/ And/or W/o  10/23/2015  CLINICAL DATA:  81 year old female with a history of abdominal pain. Recent CABG. Three vessel CABG performed 10/16/2015 EXAM: CTA ABDOMEN AND PELVIS WITH CONTRAST TECHNIQUE: Multidetector CT imaging of the abdomen and pelvis was performed using the standard protocol during bolus administration of intravenous contrast. Multiplanar reconstructed images and MIPs were obtained and reviewed to evaluate the vascular anatomy. CONTRAST:  75 cc Isovue 370 COMPARISON:   08/17/2014, 05/14/2013 FINDINGS: Lower chest: Partially visualized surgical changes of recent median sternotomy with surgical clips in the anterior mediastinum. Trace infiltration/ fluid compatible a recent surgery. Compared to prior chest x-ray there appears to be interval removal of the epicardial pacing leads. Re- demonstration of advanced changes of paraseptal/ centrilobular emphysema and scarring. Small bilateral pleural effusions with associated atelectasis. Irregular soft plaque of the distal thoracic aorta. Abdomen/pelvis: Nonvascular: Unremarkable liver, spleen, bilateral adrenal glands. Unremarkable gallbladder. Unremarkable appearance of the pancreas. Bilateral renal cortical thinning, more pronounced on the left. No hydronephrosis or nephrolithiasis. No abnormally distended small bowel or colon. Surgical suture line in the mid small bowel with no transition point. Colonic diverticula with no associated inflammatory changes. Appendix is not visualized, however, no inflammatory changes are present adjacent to the cecum to indicate an appendicitis. No free fluid.  No intraperitoneal air. No inflammatory changes within the mesenteric or within the retroperitoneum. Urinary bladder distention. Vascular: Aorta: Re- demonstration of fusiform infrarenal abdominal aortic aneurysm. Greatest diameter measures 4.8 cm, which is essentially unchanged from the comparison CT of 08/17/2014 when the aneurysm was measured up to 5 cm. No evidence of inflammatory changes adjacent to the aneurysm sac. No fluid. No dissection. Note that the lowest renal artery is the left renal artery with a diameter of the aorta at the level of the left renal artery measuring approximately 1.8 cm. Aortic neck length measures approximately 1.5 cm. Near circumferential mural thrombus in the lower aneurysm sac just above the bifurcation. Mesenteric vessels: Atherosclerotic changes at the origin of both the celiac artery and superior mesenteric  artery. 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 Less than  50% stenosis at the origin of the superior mesenteric artery. Origin of the inferior mesenteric artery appears occluded. Atherosclerotic changes at the origin the right renal artery with at least 50% stenosis. Atherosclerotic changes at the origin of the left renal artery, with the greatest degree of stenosis at least 1 cm beyond the origin. Perfusion of the kidney parenchyma relatively symmetric. Right lower extremity: Atherosclerotic changes involve the origin of the right common iliac artery. Right common iliac artery diameter measures approximately 8 mm. Dense calcifications extend along the length of the right common iliac artery. Hypogastric artery appears patent including anterior and posterior divisions. External iliac artery of small caliber though patent. Proximal femoral vasculature patent. Left lower extremity: Atherosclerotic changes involve the origin of the left common iliac artery. Left common iliac artery diameter measures 9 mm. No aneurysm or dissection. Hypogastric artery is patent with stenosis at the origin. Posterior division patent. Anterior division occluded. Left external iliac artery is patent. Proximal left femoral vessels patent. Musculoskeletal: No displaced fracture.  Mild degenerative changes of the spine. Degenerative changes of the hips. Review of the MIP images confirms the above findings. IMPRESSION: Vascular: Re- demonstration of infrarenal fusiform abdominal aortic aneurysm with unchanged maximum diameter from the most recent CT of approximately 1 year ago. No evidence of rupture or rapid growth. Maximum diameter measures approximately 4.8 cm on the current study. These findings were discussed with Dr. Fabienne Brunsharles [facility] of Vascular Surgery in person at the time of the study interpretation. No acute findings to account for the patient's new epigastric pain. Partially visualized early surgical changes of the lower  mediastinum compatible with history of median sternotomy. Compared to the prior chest x-ray, epicardial pacing leads have been removed. Atherosclerotic changes of the abdominal aorta and mesenteric vessels, as above. Of note, there is likely greater than 50% stenosis of the celiac artery origin and bilateral renal arteries. Nonvascular: Urinary bladder distention. Small bilateral pleural effusions with associated atelectasis. Re- demonstration of advanced changes of emphysema. Signed, Yvone NeuJaime S. Loreta AveWagner, DO [facility] Electronically Signed   By: Gilmer MorJaime  Wagner D.O.   On: 10/23/2015 09:50    ASSESSMENT/PLAN:  Physical deconditioning - for rehabilitation, OT and PT, fall precaution  S/P STEMI - follow-up with cardiology, Theodore Demarkhonda Barrett, PA-C in 4 weeks; continue aspirin 325 mg 1 tab by mouth daily  CAD S/P CABG X 3 - follow-up with cardiothoracic, Dr. Charlett LangoSteven Hendrickson, and vascular surgery, Dr. Leonette Mostharles fields, in 4 weeks; sternal precaution; continue tramadol 50 mg 1 tab by mouth every 12 hours when necessary and Aleve 220 mg take 2 tabs = 440 mg twice a day when necessary for pain; weigh weekly  Hyperlipidemia - continue atorvastatin 40 mg 1 tab by mouth daily Lab Results  Component Value Date   CHOL 200 10/07/2015   HDL 85 10/07/2015   LDLCALC 93 10/07/2015   TRIG 112 10/07/2015   CHOLHDL 2.4 10/07/2015   Depression - continue Olanzapine 5 mg 1 tab by mouth daily at bedtime and sertraline 50 mg 1 tab by mouth daily  Atrial fibrillation - continue metoprolol tartrate 25 mg 1 tab by mouth twice a day and aspirin EC 325 mg 1 tab by mouth daily  Anemia, acute blood loss - check CBC Lab Results  Component Value Date   WBC 11.9* 10/28/2015   HGB 9.2* 10/28/2015   HCT 27.6* 10/28/2015   MCV 91.1 10/28/2015   PLT 570* 10/28/2015   Leukocytosis -  will monitor;  no cough nor fever Lab Results  Component Value Date   WBC 11.9*  10/28/2015   AKI - will monitor Lab Results  Component Value Date   CREATININE 2.22* 10/30/2015   Abdominal aneurysm - follow-up with vascular surgery, Dr. Fabienne Bruns; for tentative stent graft placement in approximately 6 weeks  COPD - no SOB    Goals of care:  Short-term rehabilitation    Kenard Gower, NP [facility] [phone]

## 2015-11-01 LAB — CBC AND DIFFERENTIAL
HEMATOCRIT: 28 % — AB (ref 36–46)
HEMOGLOBIN: 9.2 g/dL — AB (ref 12.0–16.0)
PLATELETS: 651 10*3/uL — AB (ref 150–399)
WBC: 8.3 10^3/mL

## 2015-11-01 LAB — BASIC METABOLIC PANEL
BUN: 26 mg/dL — AB (ref 4–21)
Creatinine: 2.1 mg/dL — AB (ref 0.5–1.1)
Glucose: 98 mg/dL
Potassium: 4.2 mmol/L (ref 3.4–5.3)
Sodium: 132 mmol/L — AB (ref 137–147)

## 2015-11-02 ENCOUNTER — Encounter: Payer: Self-pay | Admitting: Internal Medicine

## 2015-11-02 ENCOUNTER — Non-Acute Institutional Stay (SKILLED_NURSING_FACILITY): Payer: Medicare Other | Admitting: Internal Medicine

## 2015-11-02 DIAGNOSIS — F329 Major depressive disorder, single episode, unspecified: Secondary | ICD-10-CM

## 2015-11-02 DIAGNOSIS — I714 Abdominal aortic aneurysm, without rupture, unspecified: Secondary | ICD-10-CM

## 2015-11-02 DIAGNOSIS — E785 Hyperlipidemia, unspecified: Secondary | ICD-10-CM | POA: Diagnosis not present

## 2015-11-02 DIAGNOSIS — D72829 Elevated white blood cell count, unspecified: Secondary | ICD-10-CM | POA: Diagnosis not present

## 2015-11-02 DIAGNOSIS — R5381 Other malaise: Secondary | ICD-10-CM | POA: Diagnosis not present

## 2015-11-02 DIAGNOSIS — I4891 Unspecified atrial fibrillation: Secondary | ICD-10-CM | POA: Diagnosis not present

## 2015-11-02 DIAGNOSIS — D62 Acute posthemorrhagic anemia: Secondary | ICD-10-CM

## 2015-11-02 DIAGNOSIS — R0789 Other chest pain: Secondary | ICD-10-CM

## 2015-11-02 DIAGNOSIS — N184 Chronic kidney disease, stage 4 (severe): Secondary | ICD-10-CM | POA: Diagnosis not present

## 2015-11-02 DIAGNOSIS — G8912 Acute post-thoracotomy pain: Secondary | ICD-10-CM | POA: Diagnosis not present

## 2015-11-02 DIAGNOSIS — E871 Hypo-osmolality and hyponatremia: Secondary | ICD-10-CM | POA: Diagnosis not present

## 2015-11-02 DIAGNOSIS — F32A Depression, unspecified: Secondary | ICD-10-CM

## 2015-11-02 DIAGNOSIS — G8918 Other acute postprocedural pain: Secondary | ICD-10-CM

## 2015-11-02 DIAGNOSIS — I2102 ST elevation (STEMI) myocardial infarction involving left anterior descending coronary artery: Secondary | ICD-10-CM | POA: Diagnosis not present

## 2015-11-02 NOTE — Progress Notes (Signed)
LOCATION: [facility]  PCP: Olivia Mackie, MD   Code Status: DNR  Goals of care: Advanced Directive information Advanced Directives 11/02/2015  Does patient have an advance directive? Yes  Type of Advance Directive Out of facility DNR (pink MOST or yellow form)  Does patient want to make changes to advanced directive? No - Patient declined  Copy of advanced directive(s) in chart? Yes       Extended Emergency Contact Information Primary Emergency Contact: Wright,Valie-Heckart Address: [address]          [place], [place] [address] [place] Home Phone: [phone] Mobile Phone: [phone] Relation: Daughter Secondary Emergency Contact: Hagman,Timothy D. Address: [address]          [place], [place] [address] [place] Mobile Phone: [phone] Relation: Son   No Known Allergies  Chief Complaint  Patient presents with  . New Admit To SNF    New Admission     HPI:  Patient is a 81 y.o. female seen today for short term rehabilitation post hospital admission from 10/06/15-10/30/15 with STEMI. She underwent catheterization showing severe disease in the LAD. She underwent angioplasty showing incomplete expansion and a stent was not placed. She had 75% stenosis of left main and 50% of LAD. She underwent CABG and had stent placed. She was placed on iv NTG drip with b blocker, statin, aspirin and brilinta. She was diagnosed to have dilated cardiomyopathy with EF 20-25%. She had ABLA and received 2 u prbc transfusion. She had AKI and received iv fluids. She had post op afib and required amiodarone and b blocker.  She is seen in her room today.    Review of Systems:  Constitutional: Negative for fever, chills. She feels weak and tired. HENT: Negative for congestion, nasal discharge. Positive for occasional headaches.   Eyes: Negative for blurred vision, double vision and discharge.  Respiratory: Negative for cough and wheezing. Positive  for shortness of breath with mild exertion.   Cardiovascular: Negative for chest pain, palpitations, leg swelling.  Gastrointestinal: Negative for heartburn, vomiting, abdominal pain. Positive for occasional nausea. Last bowel movement was 2 days ago.  Genitourinary: Negative for dysuria and flank pain.  Musculoskeletal: Negative for back pain, fall in the facility.  Skin: Negative for itching, rash.  Neurological: Positive for occasional dizziness with change of position. Psychiatric/Behavioral: Negative for depression   Past Medical History  Diagnosis Date  . COPD (chronic obstructive pulmonary disease) (HCC)   . Anxiety   . Depression   . TIA (transient ischemic attack)   . IBS (irritable bowel syndrome)   . Oral thrush   . CAD (coronary artery disease), native coronary artery 10/08/2015  . Congestive dilated cardiomyopathy (HCC) 10/08/2015  . Hyperlipidemia LDL goal <70 10/08/2015  . Abdominal aneurysm (HCC)     CTA 10/23/2015  . Post PTCA    Past Surgical History  Procedure Laterality Date  . Back surgery    . Neck surgery    . Colectomy    . Cardiac catheterization N/A 10/06/2015    Procedure: Left Heart Cath and Coronary Angiography;  Surgeon: Marykay Lex, MD;  Location: [facility] INVASIVE CV LAB;  Service: Cardiovascular;  Laterality: N/A;  . Cardiac catheterization N/A 10/06/2015    Procedure: Coronary Balloon Angioplasty;  Surgeon: Marykay Lex, MD;  Location: [facility] INVASIVE CV LAB;  Service: Cardiovascular;  Laterality: N/A;  . Coronary artery bypass graft N/A 10/16/2015    Procedure: CORONARY ARTERY BYPASS  GRAFTING (CABG)  x three, using left internal mammary artery and right leg greater saphenous vein harvested endoscopically;  Surgeon: Loreli SlotSteven C Hendrickson, MD;  [facility];  Service: Open Heart Surgery;  Laterality: N/A;  . Tee without cardioversion N/A 10/16/2015    Procedure: TRANSESOPHAGEAL ECHOCARDIOGRAM (TEE);  Surgeon: Loreli SlotSteven C Hendrickson, MD;  [facility];   Service: Open Heart Surgery;  Laterality: N/A;   Social History:   reports that she has been smoking Cigarettes.  She has been smoking about 0.50 packs per day. She has never used smokeless tobacco. She reports that she drinks about 1.2 oz of alcohol per week. She reports that she does not use illicit drugs.  Family History  Problem Relation Age of Onset  . Stroke Mother     dead  . Clotting disorder Father     dead  bone marrow disease    Medications:   Medication List       This list is accurate as of: 11/02/15  2:44 PM.  Always use your most recent med list.               ALEVE 220 MG tablet  Generic drug:  naproxen sodium  Take 440 mg by mouth 2 (two) times daily as needed (for headaches or pain).     aspirin 325 MG EC tablet  Take 1 tablet (325 mg total) by mouth daily.     atorvastatin 40 MG tablet  Commonly known as:  LIPITOR  Take 1 tablet (40 mg total) by mouth daily at 6 PM.     FUSION PO  Take 1 tablet by mouth daily.     metoprolol tartrate 25 MG tablet  Commonly known as:  LOPRESSOR  Take 1 tablet (25 mg total) by mouth 2 (two) times daily.     OLANZapine 5 MG tablet  Commonly known as:  ZYPREXA  Take 5 mg by mouth at bedtime.     phenazopyridine 200 MG tablet  Commonly known as:  PYRIDIUM  Take 200 mg by mouth 3 (three) times daily as needed for pain.     sertraline 50 MG tablet  Commonly known as:  ZOLOFT  Take 50 mg by mouth every morning.     traMADol 50 MG tablet  Commonly known as:  ULTRAM  Take 1 tablet (50 mg total) by mouth every 12 (twelve) hours as needed for moderate pain.     UNABLE TO FIND  Med Name: MedPass 120 mL TID        Immunizations: Immunization History  Administered Date(s) Administered  . PPD Test 10/30/2015  . Tdap 08/17/2014     Physical Exam: Filed Vitals:   11/02/15 1218  BP: 119/61  Pulse: 74  Temp: 98.2 F (36.8 C)  TempSrc: Oral  Resp: 18  Height: 4\' 11"  (1.499 m)  Weight: 114 lb 3.2 oz  (51.801 kg)  SpO2: 94%   Body mass index is 23.05 kg/(m^2).  General- elderly female, well built, in no acute distress Head- normocephalic, atraumatic Nose- no nasal discharge Throat- moist mucus membrane Eyes- PERRLA, EOMI, no pallor, no icterus Neck- no cervical lymphadenopathy Cardiovascular- normal s1,s2, no murmur, no leg edema Respiratory- bilateral clear to auscultation, no wheeze, no rhonchi, no crackles, no use of accessory muscles Abdomen- bowel sounds present, soft, non tender Musculoskeletal- able to move all 4 extremities, generalized weakness Neurological-  alert and oriented to person, place and time Skin- warm and dry, sternal incision healing well, surgical incision on  lower chest wall with steristrip Psychiatry- normal mood and affect    Labs reviewed: Basic Metabolic Panel:  Recent Labs  16/10/96 1823  10/17/15 0426 10/17/15 1605  10/28/15 0406 10/29/15 0240 10/30/15 0238 11/01/15  NA  --   < > 138  --   < > 129* 131* 133* 132*  K  --   < > 4.6  --   < > 3.6 4.0 4.0 4.2  CL  --   < > 109  --   < > 93* 96* 96*  --   CO2  --   --  20*  --   < > 26 25 26   --   GLUCOSE  --   < > 135*  --   < > 104* 100* 117*  --   BUN  --   < > 10  --   < > 33* 30* 29* 26*  CREATININE 0.96  < > 1.18* 1.60*  < > 2.39* 2.27* 2.22* 2.1*  CALCIUM  --   --  8.2*  --   < > 9.0 9.0 9.4  --   MG 2.7*  --  2.4 2.2  --   --   --   --   --   < > = values in this interval not displayed. Liver Function Tests:  Recent Labs  02/25/15 1850 10/06/15 1506  AST 29 30  ALT 6* 14  ALKPHOS 49 63  BILITOT 0.9 0.9  PROT 6.1* 7.0  ALBUMIN 3.4* 3.6   No results for input(s): LIPASE, AMYLASE in the last 8760 hours. No results for input(s): AMMONIA in the last 8760 hours. CBC:  Recent Labs  02/25/15 1850  10/06/15 1506  10/21/15 0300 10/27/15 0359 10/28/15 0406 11/01/15  WBC 9.7  --  10.3  < > 9.4 11.7* 11.9* 8.3  NEUTROABS 7.1  --  7.4  --   --   --   --   --   HGB 13.2  < >  11.9*  < > 10.7* 8.7* 9.2* 9.2*  HCT 37.1  < > 36.3  < > 33.8* 27.2* 27.6* 28*  MCV 95.1  --  94.0  < > 91.1 90.1 91.1  --   PLT 276  --  330  < > 277 521* 570* 651*  < > = values in this interval not displayed. Cardiac Enzymes:  Recent Labs  10/07/15 0326 10/07/15 0924 10/07/15 1619  TROPONINI 3.22* 3.27* 2.67*   BNP: Invalid input(s): POCBNP CBG:  Recent Labs  10/21/15 1705 10/22/15 0559 10/22/15 1358  GLUCAP 124* 97 133*    Radiological Exams: Dg Chest 2 View  10/21/2015  CLINICAL DATA:  Atelectasis EXAM: CHEST  2 VIEW COMPARISON:  Chest radiograph from one day prior. FINDINGS: Surgical hardware from ACDF overlies the lower cervical spine. Sternotomy wires appear aligned and intact. CABG clips overlie the mediastinum. Stable cardiomediastinal silhouette with mild cardiomegaly. No pneumothorax. Small stable bilateral pleural effusions and bibasilar atelectasis. No pulmonary edema. IMPRESSION: 1. Stable mild cardiomegaly without pulmonary edema. 2. Stable small bilateral pleural effusions and bibasilar atelectasis. Electronically Signed   By: Delbert Phenix M.D.   On: 10/21/2015 10:13   Dg Chest 2 View  10/14/2015  CLINICAL DATA:  81 year old female with a history of left-sided chest pain and shortness of breath EXAM: CHEST  2 VIEW COMPARISON:  10/06/2015, 08/17/2014 FINDINGS: Cardiomediastinal silhouette borderline enlarged. Stigmata of emphysema, with increased retrosternal airspace, flattened hemidiaphragms, increased AP diameter, and hyperinflation on  the AP view. Architectural distortion at the lung bases subpleural location on the lateral view. This is more pronounced than on the comparison of 08/17/2014, relatively unchanged from recent comparison. Interstitial opacity superior to the minor fissure on the right is unchanged from 2016 and the most recent comparison. No pneumothorax. No pleural effusion. Atherosclerosis. Surgical changes of the cervical region. IMPRESSION: Chronic  changes of advanced emphysema and architectural distortion, most pronounced at the bilateral lung bases. Difficult to exclude a superimposed infection. Signed, Yvone Neu. Loreta Ave, DO [facility] Electronically Signed   By: Gilmer Mor D.O.   On: 10/14/2015 09:46   US Aorta  10/09/2015  CLINICAL DATA:  Aortic aneurysm, follow-up. Recently scratch the previous tobacco abuse. EXAM: ULTRASOUND OF ABDOMINAL AORTA TECHNIQUE: Ultrasound examination of the abdominal aorta was performed to evaluate for abdominal aortic aneurysm. COMPARISON:  CT 08/17/2014 and previous FINDINGS: Abdominal Aorta Distal fusiform dilatation to maximum AP diameter of 5 cm (stable since prior CT) extending over length of at least 8 cm. Right common iliac artery measures 11 mm diameter, left 9 mm. IMPRESSION: 1. Stable 5 cm distal abdominal aortic fusiform aneurysm. Recommend followup by abdomen and pelvis CTA in 3-6 months, and vascular surgery referral/consultation if not already obtained. This recommendation follows ACR consensus guidelines: White Paper of the ACR Incidental Findings Committee II on Vascular Findings. J Am Coll Radiol 2013; 10:789-794. Electronically Signed   By: [name] M.D.   On: 10/09/2015 16:18   Dg Chest Port 1 View  10/20/2015  CLINICAL DATA:  Shortness of breath. EXAM: PORTABLE CHEST 1 VIEW COMPARISON:  10/19/2015. FINDINGS: Prior CABG. Cardiomegaly. Persistent but improving bilateral from interstitial prominence and bilateral pleural effusions consistent with improved congestive heart failure. No pneumothorax. Prior cervical spine fusion . IMPRESSION: Prior CABG.  Persistent but improving congestive heart failure. Electronically Signed   By: Maisie Fus  Register   On: 10/20/2015 08:09   Dg Chest Port 1 View  10/19/2015  CLINICAL DATA:  Status post CABG EXAM: PORTABLE CHEST 1 VIEW COMPARISON:  Chest radiograph from one day prior. FINDINGS: Right internal  jugular central venous sheath terminates in the right brachiocephalic vein. Sternotomy wires appear aligned and intact. CABG clips overlie the mediastinum. Epicardial pacer leads overlie the heart. Partially visualized surgical hardware from ACDF overlies the lower cervical spine. Stable cardiomediastinal silhouette with mild cardiomegaly. No pneumothorax. Small bilateral pleural effusions, slightly decreased bilaterally. Stable mild pulmonary edema. Patchy bibasilar lung opacities appear slightly decreased bilaterally. IMPRESSION: 1. Stable mild cardiomegaly and mild pulmonary edema . 2. Small bilateral pleural effusions, slightly decreased bilaterally. 3. Patchy bibasilar lung opacities, slightly decreased bilaterally, favor atelectasis. Electronically Signed   By: Delbert Phenix M.D.   On: 10/19/2015 08:13   Dg Chest Port 1 View  10/18/2015  CLINICAL DATA:  Shortness of Breath EXAM: PORTABLE CHEST 1 VIEW COMPARISON:  10/17/2015 FINDINGS: Cardiac shadow is stable. Interval removal of Swan-Ganz catheter, mediastinal drain and thoracostomy catheter is noted. No pneumothorax is seen. Right jugular sheath remains. Bibasilar infiltrates are noted increased from the prior exam. Right-sided pleural effusion is noted as well. No acute bony abnormality is seen. IMPRESSION: Interval removal of tubes and lines. Increasing bibasilar infiltrates and right-sided effusion. Electronically Signed   By: Alcide Clever M.D.   On: 10/18/2015 07:54   Dg Chest Port 1 View  10/17/2015  CLINICAL DATA:  CABG.  Chest tube placement. EXAM: PORTABLE CHEST 1 VIEW COMPARISON:  10/16/2015. FINDINGS: Interim extubation removal  of NG tube. Bilateral chest tubes and Swan-Ganz catheter in stable position. Prior CABG. Cardiomegaly with diffuse bilateral from interstitial prominence and small bilateral pleural effusions consistent congestive heart failure. Low lung volumes. Tiny left apical pneumothorax noted. Prior cervical spine fusion.  IMPRESSION: 1. Interim extubation and removal of NG tube. Swan-Ganz catheter and bilateral chest tubes in stable position. Tiny left apical pneumothorax noted. 2. Prior CABG. Cardiomegaly with bilateral pulmonary interstitial prominence and bilateral pleural effusions consistent with congestive heart failure. Critical Value/emergent results were called by telephone at the time of interpretation on 10/17/2015 at 7:28 am to nurse Herbert Seta, who verbally acknowledged these results. Electronically Signed   By: Maisie Fus  Register   On: 10/17/2015 07:30   Dg Chest Port 1 View  10/16/2015  CLINICAL DATA:  Status post coronary artery bypass graft x3. EXAM: PORTABLE CHEST 1 VIEW COMPARISON:  October 14, 2015. FINDINGS: Left-sided chest tube is noted without pneumothorax. Mild right basilar subsegmental atelectasis is noted. Endotracheal tube is projected over tracheal air shadow with distal tip 8 cm above the carina. Nasogastric tube is seen entering the stomach. Right internal jugular Swan-Ganz catheter is noted with tip in expected position of main pulmonary artery. Bony thorax is unremarkable. IMPRESSION: Endotracheal and nasogastric tubes in grossly good position. Left-sided chest tube is noted without evidence of pneumothorax. Electronically Signed   By: Lupita Raider, M.D.   On: 10/16/2015 12:56   Dg Chest Port 1 View  10/06/2015  CLINICAL DATA:  Post cardiac catheterization EXAM: PORTABLE CHEST 1 VIEW COMPARISON:  08/17/2014 FINDINGS: Mild hyperinflation of the lungs. Heart is borderline enlarged. Biapical pleural thickening. Scarring in the lung bases. No acute airspace opacities or effusions. No acute bony abnormality. No pneumothorax. IMPRESSION: COPD/chronic changes.  Borderline heart size. Electronically Signed   By: Charlett Nose M.D.   On: 10/06/2015 19:24   Ct Angio Abd/pel W/ And/or W/o  10/23/2015  CLINICAL DATA:  81 year old female with a history of abdominal pain. Recent CABG. Three vessel CABG performed  10/16/2015 EXAM: CTA ABDOMEN AND PELVIS WITH CONTRAST TECHNIQUE: Multidetector CT imaging of the abdomen and pelvis was performed using the standard protocol during bolus administration of intravenous contrast. Multiplanar reconstructed images and MIPs were obtained and reviewed to evaluate the vascular anatomy. CONTRAST:  75 cc Isovue 370 COMPARISON:  08/17/2014, 05/14/2013 FINDINGS: Lower chest: Partially visualized surgical changes of recent median sternotomy with surgical clips in the anterior mediastinum. Trace infiltration/ fluid compatible a recent surgery. Compared to prior chest x-ray there appears to be interval removal of the epicardial pacing leads. Re- demonstration of advanced changes of paraseptal/ centrilobular emphysema and scarring. Small bilateral pleural effusions with associated atelectasis. Irregular soft plaque of the distal thoracic aorta. Abdomen/pelvis: Nonvascular: Unremarkable liver, spleen, bilateral adrenal glands. Unremarkable gallbladder. Unremarkable appearance of the pancreas. Bilateral renal cortical thinning, more pronounced on the left. No hydronephrosis or nephrolithiasis. No abnormally distended small bowel or colon. Surgical suture line in the mid small bowel with no transition point. Colonic diverticula with no associated inflammatory changes. Appendix is not visualized, however, no inflammatory changes are present adjacent to the cecum to indicate an appendicitis. No free fluid.  No intraperitoneal air. No inflammatory changes within the mesenteric or within the retroperitoneum. Urinary bladder distention. Vascular: Aorta: Re- demonstration of fusiform infrarenal abdominal aortic aneurysm. Greatest diameter measures 4.8 cm, which is essentially unchanged from the comparison CT of 08/17/2014 when the aneurysm was measured up to 5 cm. No evidence of inflammatory changes adjacent to  the aneurysm sac. No fluid. No dissection. Note that the lowest renal artery is the left renal  artery with a diameter of the aorta at the level of the left renal artery measuring approximately 1.8 cm. Aortic neck length measures approximately 1.5 cm. Near circumferential mural thrombus in the lower aneurysm sac just above the bifurcation. Mesenteric vessels: Atherosclerotic changes at the origin of both the celiac artery and superior mesenteric artery. 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 Less than 50% stenosis at the origin of the superior mesenteric artery. Origin of the inferior mesenteric artery appears occluded. Atherosclerotic changes at the origin the right renal artery with at least 50% stenosis. Atherosclerotic changes at the origin of the left renal artery, with the greatest degree of stenosis at least 1 cm beyond the origin. Perfusion of the kidney parenchyma relatively symmetric. Right lower extremity: Atherosclerotic changes involve the origin of the right common iliac artery. Right common iliac artery diameter measures approximately 8 mm. Dense calcifications extend along the length of the right common iliac artery. Hypogastric artery appears patent including anterior and posterior divisions. External iliac artery of small caliber though patent. Proximal femoral vasculature patent. Left lower extremity: Atherosclerotic changes involve the origin of the left common iliac artery. Left common iliac artery diameter measures 9 mm. No aneurysm or dissection. Hypogastric artery is patent with stenosis at the origin. Posterior division patent. Anterior division occluded. Left external iliac artery is patent. Proximal left femoral vessels patent. Musculoskeletal: No displaced fracture.  Mild degenerative changes of the spine. Degenerative changes of the hips. Review of the MIP images confirms the above findings. IMPRESSION: Vascular: Re- demonstration of infrarenal fusiform abdominal aortic aneurysm with unchanged maximum diameter from the most recent CT of approximately 1 year ago. No  evidence of rupture or rapid growth. Maximum diameter measures approximately 4.8 cm on the current study. These findings were discussed with Dr. Fabienne Brunsharles Fields of Vascular Surgery in person at the time of the study interpretation. No acute findings to account for the patient's new epigastric pain. Partially visualized early surgical changes of the lower mediastinum compatible with history of median sternotomy. Compared to the prior chest x-ray, epicardial pacing leads have been removed. Atherosclerotic changes of the abdominal aorta and mesenteric vessels, as above. Of note, there is likely greater than 50% stenosis of the celiac artery origin and bilateral renal arteries. Nonvascular: Urinary bladder distention. Small bilateral pleural effusions with associated atelectasis. Re- demonstration of advanced changes of emphysema. Signed, Yvone NeuJaime S. Loreta AveWagner, DO [facility] Electronically Signed   By: Gilmer MorJaime  Wagner D.O.   On: 10/23/2015 09:50    Assessment/Plan  Physical deconditioning Will have her work with physical therapy and occupational therapy team to help with gait training and muscle strengthening exercises.fall precautions. Skin care. Encourage to be out of bed.   CAD S/P STEMI and is s/p CABG. Has follow up with cardiology and cardiothroacic surgeon. Continue aspirin 325 mg daily with lopressor 50 mg bid, atorvastatin 40 mg daily  Chest wall soreness S/p surgery. Continue tramadol 50 mg bid prn. D/c aleve with her on aspirin.  Blood loss anemia Post surgery with 2 u prbc transfusion. Monitor cbc  afib Rate controlled. Continue metoprolol tartrate and aspirin  Hyperlipidemia continue atorvastatin 40 mg daily  ckd stage 4 Monitor bmp  Hyponatremia Monitor po intake and mental state, currently aaox 3, monitor bmp  Depression continue sertraline 50 mg daily with olanzapine 5 mg qhs and monitor  Abdominal aneurysm To follow with  vascular surgery for possible stent with graft placement   Leukocytosis resolved  Goals of care: short term rehabilitation   Labs/tests ordered: bmp 11/06/15  Family/ staff Communication: reviewed care plan with patient and nursing supervisor    Oneal Grout, MD Internal Medicine [facility] [address] [place], [place] [address] Cell Phone (Monday-Friday 8 am - 5 pm): [phone] On Call: [phone] and follow prompts after 5 pm and on weekends Office Phone: [phone] Office Fax: [phone]

## 2015-11-06 LAB — BASIC METABOLIC PANEL
BUN: 18 mg/dL (ref 4–21)
Creatinine: 1.4 mg/dL — AB (ref 0.5–1.1)
Glucose: 94 mg/dL
Potassium: 4.5 mmol/L (ref 3.4–5.3)
Sodium: 137 mmol/L (ref 137–147)

## 2015-11-09 ENCOUNTER — Ambulatory Visit (INDEPENDENT_AMBULATORY_CARE_PROVIDER_SITE_OTHER): Payer: Medicare Other | Admitting: Physician Assistant

## 2015-11-09 ENCOUNTER — Encounter: Payer: Self-pay | Admitting: Physician Assistant

## 2015-11-09 VITALS — BP 112/68 | HR 89 | Ht 59.0 in | Wt 103.2 lb

## 2015-11-09 DIAGNOSIS — I480 Paroxysmal atrial fibrillation: Secondary | ICD-10-CM | POA: Diagnosis not present

## 2015-11-09 DIAGNOSIS — Z951 Presence of aortocoronary bypass graft: Secondary | ICD-10-CM

## 2015-11-09 DIAGNOSIS — Z79899 Other long term (current) drug therapy: Secondary | ICD-10-CM

## 2015-11-09 DIAGNOSIS — N183 Chronic kidney disease, stage 3 unspecified: Secondary | ICD-10-CM

## 2015-11-09 NOTE — Progress Notes (Signed)
Cardiology Office Note   Date:  11/09/2015   ID:  Tonya Payne, DOB 08-Jan-1935, MRN [id number]  PCP:  Olivia MackieWilliam Cho, MD  Cardiologist:  Dr Herbie BaltimoreHarding (did H&P)   Barrett, Bjorn Loserhonda, PA-C   No chief complaint on file.   History of Present Illness: Tonya Payne is a 81 y.o. female with a history of HLD, AAA, COPD, TIA, IBS, anxiety/depression, tob use.  D/c 06/26 after admit for STEMI>>abnl cath>>CABG w/ LIMA-LAD, SVG-D1, SVG-RCA, EF 20-25% by cath 06/02, normal by echo 06/05. She had post-op afib but was in sinus rhythm at discharge.  Tonya AlmSuann Hockman presents for post hospital follow-up.  Since discharge from the hospital, she has done well. [facility]. Her planned discharge date is within the next 2 weeks.  She denies chest pain except for musculoskeletal pain from the surgery, lower extremity edema, orthopnea or PND. She is gradually increasing in strength and endurance. She is tolerating her medications well and not having any side effects. She is very slender. She admits that she eats fairly poorly because she doesn't like the food. However, she is getting a small amount of Ensure 3 times a day with meals and she likes that. She also like some protein bars and is willing to eat those as well. She has not had any palpitations and does not believe she has had any more atrial fibrillation.  She has struggled with constipation and was given a laxative, so now has been having diarrhea. She feels that Colace will help. She has taken this before at home.   Past Medical History  Diagnosis Date  . COPD (chronic obstructive pulmonary disease) (HCC)   . Anxiety   . Depression   . TIA (transient ischemic attack)   . IBS (irritable bowel syndrome)   . Oral thrush   . CAD (coronary artery disease), native coronary artery 10/08/2015  . Congestive dilated cardiomyopathy (HCC) 10/08/2015  . Hyperlipidemia LDL goal <70 10/08/2015  . Abdominal aneurysm (HCC)     CTA  10/23/2015  . Post PTCA   . Atrial fibrillation, new onset (HCC) 10/2015    after CABG, in SR at d/c    Past Surgical History  Procedure Laterality Date  . Back surgery    . Neck surgery    . Colectomy    . Cardiac catheterization N/A 10/06/2015    Procedure: Left Heart Cath and Coronary Angiography;  Surgeon: Marykay Lexavid W Harding, MD;  [facility];  Service: Cardiovascular;  Laterality: N/A;  . Cardiac catheterization N/A 10/06/2015    Procedure: Coronary Balloon Angioplasty;  Surgeon: Marykay Lexavid W Harding, MD;  [facility];  Service: Cardiovascular;  Laterality: N/A;  . Coronary artery bypass graft N/A 10/16/2015    Procedure: CORONARY ARTERY BYPASS GRAFTING (CABG)  x three, using left internal mammary artery and right leg greater saphenous vein harvested endoscopically;  Surgeon: Loreli SlotSteven C Hendrickson, MD;  [facility];  Service: Open Heart Surgery;  Laterality: N/A;  . Tee without cardioversion N/A 10/16/2015    Procedure: TRANSESOPHAGEAL ECHOCARDIOGRAM (TEE);  Surgeon: Loreli SlotSteven C Hendrickson, MD;  [facility];  Service: Open Heart Surgery;  Laterality: N/A;    Current Outpatient Prescriptions  Medication Sig Dispense Refill  . aspirin EC 325 MG EC tablet Take 1 tablet (325 mg total) by mouth daily.    Marland Kitchen. atorvastatin (LIPITOR) 40 MG tablet Take 1 tablet (40 mg total) by mouth daily at 6 PM.    .  Fe Fum-Fe Poly-Vit C-Lactobac (FUSION PO) Take 1 tablet by mouth daily.    . metoprolol tartrate (LOPRESSOR) 25 MG tablet Take 1 tablet (25 mg total) by mouth 2 (two) times daily.    . naproxen sodium (ALEVE) 220 MG tablet Take 440 mg by mouth 2 (two) times daily as needed (for headaches or pain).    Marland Kitchen OLANZapine (ZYPREXA) 5 MG tablet Take 5 mg by mouth at bedtime.    . phenazopyridine (PYRIDIUM) 200 MG tablet Take 200 mg by mouth 3 (three) times daily as needed for pain.    Marland Kitchen sertraline (ZOLOFT) 50 MG tablet Take 50 mg by mouth every morning.    . traMADol (ULTRAM) 50  MG tablet Take 1 tablet (50 mg total) by mouth every 12 (twelve) hours as needed for moderate pain. 30 tablet 0  . UNABLE TO FIND Med Name: MedPass 120 mL TID     No current facility-administered medications for this visit.   Allergies:   Review of patient's allergies indicates no known allergies.    Social History:  The patient  reports that she quit smoking about 6 months ago. Her smoking use included Cigarettes. She smoked 0.50 packs per day. She has never used smokeless tobacco. She reports that she drinks about 1.2 oz of alcohol per week. She reports that she does not use illicit drugs.   Family History:  The patient's family history includes Clotting disorder in her father; Stroke in her mother.    ROS:  Please see the history of present illness. All other systems are reviewed and negative.    PHYSICAL EXAM: VS:  BP 112/68 mmHg  Pulse 89  Ht  (1.499 m)  Wt 103 lb 3.2 oz (46.811 kg)  BMI 20.83 kg/m2 , BMI Body mass index is 20.83 kg/(m^2). GEN: Well nourished, well developed, female in no acute distress HEENT: normal for age  Neck: no JVD, no carotid bruit, no masses Cardiac: RRR; soft murmur, no rubs, or gallops Respiratory:  Few rales bases bilaterally, normal work of breathing GI: soft, nontender, nondistended, + BS MS: no deformity or atrophy; no edema; distal pulses are 2+ in all 4 extremities  Skin: warm and dry, no rash Neuro:  Strength and sensation are intact Psych: euthymic mood, full affect   EKG:  EKG is ordered today. The ekg ordered today demonstrates sinus rhythm, rate 89, lateral T-wave changes are less pronounced than on previous ECGs   Recent Labs: 10/06/2015: ALT 14; TSH 4.405 10/17/2015: Magnesium 2.2 11/01/2015: BUN 26*; Creatinine 2.1*; Hemoglobin 9.2*; Platelets 651*; Potassium 4.2; Sodium 132*    Lipid Panel    Component Value Date/Time   CHOL 200 10/07/2015 0326   TRIG 112 10/07/2015 0326   HDL 85 10/07/2015 0326   CHOLHDL 2.4  10/07/2015 0326   VLDL 22 10/07/2015 0326   LDLCALC 93 10/07/2015 0326     Wt Readings from Last 3 Encounters:  11/09/15 103 lb 3.2 oz (46.811 kg)  11/02/15 114 lb 3.2 oz (51.801 kg)  10/31/15 114 lb 3.2 oz (51.801 kg)     Other studies Reviewed: Additional studies/ records that were reviewed today include: Hospital records and testing, labs from [facility].  ASSESSMENT AND PLAN:  1.  STEMI 10/2015 with subsequent CABG:: She is doing well. She is on ASA, BB, statin. Her EF was low at cath, but had normalized by echo 3 days later, likely stunned myocardium. Her blood pressure is well controlled. Continue current therapy and strengthening.  After she is discharged, she may benefit from cardiac rehabilitation, if she is strong enough.  2. CKD III-IV: Her renal function was checked on 06/28. At that time her sodium was 132, BUN 26, and creatinine 2.07. Her GFR was 25. We will recheck this today. She is not on an ACE inhibitor or ARB because of her poor renal function.  3. PAF: Her CHADS2VASC=6 (CAD, TIA x 2, female, age x 2). The atrial fib happened postoperatively. She was started on amiodarone and converted spontaneously to sinus rhythm. She is having no palpitations. Current anticoagulation is with aspirin 325 mg. Continue this for now and follow for symptoms.   Current medicines are reviewed at length with the patient today.  The patient has concerns regarding medicines. Her son manages her medicines as an outpatient. She has several new prescriptions that she is getting at the rehabilitation facility and he wonders about continuing those after discharge.  The following changes have been made:  no change, but we will give him prescriptions for her cardiac meds  Labs/ tests ordered today include:   Orders Placed This Encounter  Procedures  . Basic metabolic panel  . EKG 12-Lead     Disposition:   FU with Dr Herbie BaltimoreHarding  Signed, Theodore DemarkBarrett, Rhonda, PA-C  11/09/2015 12:00 PM      [facility] Phone: [phone]; Fax: [phone]  This note was written with the assistance of speech recognition software. Please excuse any transcriptional errors.

## 2015-11-09 NOTE — Patient Instructions (Signed)
Your physician recommends that you continue on your current medications as directed. Please refer to the Current Medication list given to you today.  Your physician recommends that you return for lab work TODAY -- first floor of this building - [address]  Your physician wants you to follow-up in: THREE MONTHS with Dr. Herbie BaltimoreHarding. You will receive a reminder letter in the mail two months in advance. If you don't receive a letter, please call our office to schedule the follow-up appointment.

## 2015-11-10 ENCOUNTER — Encounter: Payer: Self-pay | Admitting: Internal Medicine

## 2015-11-10 LAB — BASIC METABOLIC PANEL
BUN: 20 mg/dL (ref 7–25)
CALCIUM: 9.3 mg/dL (ref 8.6–10.4)
CO2: 23 mmol/L (ref 20–31)
CREATININE: 1.5 mg/dL — AB (ref 0.60–0.93)
Chloride: 97 mmol/L — ABNORMAL LOW (ref 98–110)
GLUCOSE: 101 mg/dL — AB (ref 65–99)
Potassium: 4.1 mmol/L (ref 3.5–5.3)
Sodium: 133 mmol/L — ABNORMAL LOW (ref 135–146)

## 2015-11-17 ENCOUNTER — Encounter: Payer: Self-pay | Admitting: Adult Health

## 2015-11-17 ENCOUNTER — Non-Acute Institutional Stay (SKILLED_NURSING_FACILITY): Payer: Medicare Other | Admitting: Adult Health

## 2015-11-17 DIAGNOSIS — F329 Major depressive disorder, single episode, unspecified: Secondary | ICD-10-CM

## 2015-11-17 DIAGNOSIS — I2102 ST elevation (STEMI) myocardial infarction involving left anterior descending coronary artery: Secondary | ICD-10-CM | POA: Diagnosis not present

## 2015-11-17 DIAGNOSIS — G4700 Insomnia, unspecified: Secondary | ICD-10-CM | POA: Diagnosis not present

## 2015-11-17 DIAGNOSIS — E785 Hyperlipidemia, unspecified: Secondary | ICD-10-CM

## 2015-11-17 DIAGNOSIS — I714 Abdominal aortic aneurysm, without rupture, unspecified: Secondary | ICD-10-CM

## 2015-11-17 DIAGNOSIS — I4891 Unspecified atrial fibrillation: Secondary | ICD-10-CM

## 2015-11-17 DIAGNOSIS — R5381 Other malaise: Secondary | ICD-10-CM

## 2015-11-17 DIAGNOSIS — D62 Acute posthemorrhagic anemia: Secondary | ICD-10-CM

## 2015-11-17 DIAGNOSIS — Z951 Presence of aortocoronary bypass graft: Secondary | ICD-10-CM | POA: Diagnosis not present

## 2015-11-17 DIAGNOSIS — I25119 Atherosclerotic heart disease of native coronary artery with unspecified angina pectoris: Secondary | ICD-10-CM | POA: Diagnosis not present

## 2015-11-17 DIAGNOSIS — J449 Chronic obstructive pulmonary disease, unspecified: Secondary | ICD-10-CM

## 2015-11-17 DIAGNOSIS — F32A Depression, unspecified: Secondary | ICD-10-CM

## 2015-11-17 DIAGNOSIS — N179 Acute kidney failure, unspecified: Secondary | ICD-10-CM

## 2015-11-17 NOTE — Progress Notes (Signed)
Patient ID: Tonya Payne, female   DOB: 10-09-1934, 81 y.o.   MRN: [id number]    DATE:  11/17/2015   MRN:  [id number]  BIRTHDAY: 10-09-1934  Facility:  Nursing Home Location:  [facility]  Nursing Home Room Number: 706-P  LEVEL OF CARE:  SNF (31)  Contact Information    Name Relation Home Work [place]   Wright,Valie-Hast Daughter [phone]  [phone]   Hagman,Timothy D. Son   [phone]       Code Status History    Date Active Date Inactive Code Status Order ID Comments User Context   10/06/2015  4:49 PM 10/16/2015 12:28 PM Full Code [id number]  Leone BrandLaura R Ingold, NP Inpatient   02/25/2015  8:32 PM 02/28/2015 12:29 PM Full Code [id number]  Marlon Peliffany Greene, PA-C ED   07/09/2013 11:21 AM 07/09/2013  8:06 PM Full Code [id number]  Joya Gaskinsonald W Wickline, MD ED    Advance Directive Documentation        Most Recent Value   Type of Advance Directive  Out of facility DNR (pink MOST or yellow form)   Pre-existing out of facility DNR order (yellow form or pink MOST form)     "MOST" Form in Place?         Chief Complaint  Patient presents with  . Discharge Note    HISTORY OF PRESENT ILLNESS:   This is a 81 year old female who is for discharge home with Home health PT, OT, CNA and Nursing. DME:  Bedside commode  She has [facility]. She has PMH of Tobacco abuse, COPD, TIA, IBS, depression and anxiety. She was treated in the hospital for STEMI and CAD. She was having chest pain and EMS brought her to the hospital. She had ST elevation in V4-6. Chest pain did not resolve with SL NTG. She was taken to cath lab and found to have severe disease in the LAD. Angioplasty was performed but there was incomplete expansion and a stent was not placed. She has a 75% left main stenosis, multiple calcified lesions in the LAD and a 50% stenosis in a large dominant LAD. The circumflex is a very small vessel. Consultation with cardiothoracic  with Dr. Charlett LangoSteven Hendrickson and recommended to proceed with coronary artery surgical revascularization.  She had CABG X 3 , LIMA to LAD, SVG to diagonal and SVG to RCA and was place on ASA/statin/beta blocker/IV nitroglycerin/heparin drips and Brilinta. She was also found to have dilated cardiomyopathy. She had ejection fraction 20-25%. She had AKI due to dye exposure but creatinine improved to baseline with hydration. She was transfused 2 units packed RBC due to expected acute blood loss anemia postop. She had postop atrial fibrillation which has been treated with amiodarone and beta blocker. She is now maintaining sinus rhythm. She has a 5 cm abdominal aneurysm and was felt to be stable in size with no evidence of dissection or rupture which will be followed up as an outpatient for tentative stent graft placement.  Patient was admitted to this facility for short-term rehabilitation after the patient's recent hospitalization.  Patient has completed SNF rehabilitation and therapy has cleared the patient for discharge.  PAST MEDICAL HISTORY:  Past Medical History  Diagnosis Date  . COPD (chronic obstructive pulmonary disease) (HCC)   . Anxiety   . Depression   . TIA (transient ischemic attack)   . IBS (irritable bowel syndrome)   . Oral thrush   . CAD (coronary  artery disease), native coronary artery 10/08/2015  . Congestive dilated cardiomyopathy (HCC) 10/08/2015  . Hyperlipidemia LDL goal <70 10/08/2015  . Abdominal aneurysm (HCC)     CTA 10/23/2015  . Post PTCA   . Atrial fibrillation, new onset (HCC) 10/2015    after CABG, in SR at d/c     CURRENT MEDICATIONS: Reviewed  Patient's Medications  New Prescriptions   No medications on file  Previous Medications   ACETAMINOPHEN (TYLENOL) 325 MG TABLET    Take by mouth every 4 (four) hours as needed for mild pain or moderate pain.   ASPIRIN EC 325 MG EC TABLET    Take 1 tablet (325 mg total) by mouth daily.   ATORVASTATIN (LIPITOR) 40 MG TABLET     Take 1 tablet (40 mg total) by mouth daily at 6 PM.   FE FUM-FE POLY-VIT C-LACTOBAC (FUSION PO)    Take 1 tablet by mouth daily.   MELATONIN 3 MG TABS    Take 3 mg by mouth at bedtime.   METOPROLOL TARTRATE (LOPRESSOR) 25 MG TABLET    Take 1 tablet (25 mg total) by mouth 2 (two) times daily.   OLANZAPINE (ZYPREXA) 5 MG TABLET    Take 5 mg by mouth at bedtime.   PHENAZOPYRIDINE (PYRIDIUM) 200 MG TABLET    Take 200 mg by mouth 3 (three) times daily as needed for pain.   PHENYLEPHRINE-SHARK LIVER OIL-MINERAL OIL-PETROLATUM (PREPARATION H) 0.25-3-14-71.9 % RECTAL OINTMENT    Place 1 application rectally daily as needed for hemorrhoids.   POLYETHYLENE GLYCOL (MIRALAX / GLYCOLAX) PACKET    Take 17 g by mouth daily.   SENNOSIDES-DOCUSATE SODIUM (SENOKOT-S) 8.6-50 MG TABLET    Take 2 tablets by mouth 2 (two) times daily.   SERTRALINE (ZOLOFT) 50 MG TABLET    Take 50 mg by mouth every morning.   TRAMADOL (ULTRAM) 50 MG TABLET    Take 50 mg by mouth every 6 (six) hours as needed.   UNABLE TO FIND    Med Name: MedPass 120 mL TID  Modified Medications   No medications on file  Discontinued Medications   NAPROXEN SODIUM (ALEVE) 220 MG TABLET    Take 440 mg by mouth 2 (two) times daily as needed (for headaches or pain).   TRAMADOL (ULTRAM) 50 MG TABLET    Take 1 tablet (50 mg total) by mouth every 12 (twelve) hours as needed for moderate pain.     No Known Allergies   REVIEW OF SYSTEMS:  GENERAL: no change in appetite, no fatigue, no weight changes, no fever, chills or weakness EYES: Denies change in vision, dry eyes, eye pain, itching or discharge EARS: Denies change in hearing, ringing in ears, or earache NOSE: Denies nasal congestion or epistaxis MOUTH and THROAT: Denies oral discomfort, gingival pain or bleeding, pain from teeth or hoarseness   RESPIRATORY: no cough, SOB, DOE, wheezing, hemoptysis CARDIAC: no chest pain, edema or palpitations GI: no abdominal pain, diarrhea, constipation,  heart burn, nausea or vomiting GU: Denies dysuria, frequency, hematuria, incontinence, or discharge PSYCHIATRIC: Denies feeling of depression or anxiety. No report of hallucinations, insomnia, paranoia, or agitation     PHYSICAL EXAMINATION  GENERAL APPEARANCE: Well nourished. In no acute distress. Normal body habitus SKIN:  Midline chest surgical incision healed  HEAD: Normal in size and contour. No evidence of trauma EYES: Lids open and close normally. No blepharitis, entropion or ectropion. PERRL. Conjunctivae are clear and sclerae are white. Lenses are without opacity EARS: Pinnae  are normal. Patient hears normal voice tunes of the examiner MOUTH and THROAT: Lips are without lesions. Oral mucosa is moist and without lesions. Tongue is normal in shape, size, and color and without lesions NECK: supple, trachea midline, no neck masses, no thyroid tenderness, no thyromegaly LYMPHATICS: no LAN in the neck, no supraclavicular LAN RESPIRATORY: breathing is even & unlabored, BS CTAB CARDIAC: RRR, no murmur,no extra heart sounds, no edema GI: abdomen soft, normal BS, no masses, no tenderness, no hepatomegaly, no splenomegaly EXTREMITIES:  Able to move 4 extremities PSYCHIATRIC: Alert and oriented X 3. Affect and behavior are appropriate  LABS/RADIOLOGY: Labs reviewed: Basic Metabolic Panel:  Recent Labs  16/10/96 1823  10/17/15 0426 10/17/15 1605  10/29/15 0240 10/30/15 0238 11/01/15 11/06/15 11/09/15 0947  NA  --   < > 138  --   < > 131* 133* 132* 137 133*  K  --   < > 4.6  --   < > 4.0 4.0 4.2 4.5 4.1  CL  --   < > 109  --   < > 96* 96*  --   --  97*  CO2  --   --  20*  --   < > 25 26  --   --  23  GLUCOSE  --   < > 135*  --   < > 100* 117*  --   --  101*  BUN  --   < > 10  --   < > 30* 29* 26* 18 20  CREATININE 0.96  < > 1.18* 1.60*  < > 2.27* 2.22* 2.1* 1.4* 1.50*  CALCIUM  --   --  8.2*  --   < > 9.0 9.4  --   --  9.3  MG 2.7*  --  2.4 2.2  --   --   --   --   --   --   <  > = values in this interval not displayed. Liver Function Tests:  Recent Labs  02/25/15 1850 10/06/15 1506  AST 29 30  ALT 6* 14  ALKPHOS 49 63  BILITOT 0.9 0.9  PROT 6.1* 7.0  ALBUMIN 3.4* 3.6   CBC:  Recent Labs  02/25/15 1850  10/06/15 1506  10/21/15 0300 10/27/15 0359 10/28/15 0406 11/01/15  WBC 9.7  --  10.3  < > 9.4 11.7* 11.9* 8.3  NEUTROABS 7.1  --  7.4  --   --   --   --   --   HGB 13.2  < > 11.9*  < > 10.7* 8.7* 9.2* 9.2*  HCT 37.1  < > 36.3  < > 33.8* 27.2* 27.6* 28*  MCV 95.1  --  94.0  < > 91.1 90.1 91.1  --   PLT 276  --  330  < > 277 521* 570* 651*  < > = values in this interval not displayed.  Lipid Panel:  Recent Labs  10/06/15 1506 10/07/15 0326  HDL 107 85   Cardiac Enzymes:  Recent Labs  10/07/15 0326 10/07/15 0924 10/07/15 1619  TROPONINI 3.22* 3.27* 2.67*   CBG:  Recent Labs  10/21/15 1705 10/22/15 0559 10/22/15 1358  GLUCAP 124* 97 133*     Dg Chest 2 View  10/21/2015  CLINICAL DATA:  Atelectasis EXAM: CHEST  2 VIEW COMPARISON:  Chest radiograph from one day prior. FINDINGS: Surgical hardware from ACDF overlies the lower cervical spine. Sternotomy wires appear aligned and intact. CABG clips overlie the mediastinum.  Stable cardiomediastinal silhouette with mild cardiomegaly. No pneumothorax. Small stable bilateral pleural effusions and bibasilar atelectasis. No pulmonary edema. IMPRESSION: 1. Stable mild cardiomegaly without pulmonary edema. 2. Stable small bilateral pleural effusions and bibasilar atelectasis. Electronically Signed   By: Delbert Phenix M.D.   On: 10/21/2015 10:13   Dg Chest Port 1 View  10/20/2015  CLINICAL DATA:  Shortness of breath. EXAM: PORTABLE CHEST 1 VIEW COMPARISON:  10/19/2015. FINDINGS: Prior CABG. Cardiomegaly. Persistent but improving bilateral from interstitial prominence and bilateral pleural effusions consistent with improved congestive heart failure. No pneumothorax. Prior cervical spine fusion .  IMPRESSION: Prior CABG.  Persistent but improving congestive heart failure. Electronically Signed   By: Maisie Fus  Register   On: 10/20/2015 08:09   Dg Chest Port 1 View  10/19/2015  CLINICAL DATA:  Status post CABG EXAM: PORTABLE CHEST 1 VIEW COMPARISON:  Chest radiograph from one day prior. FINDINGS: Right internal jugular central venous sheath terminates in the right brachiocephalic vein. Sternotomy wires appear aligned and intact. CABG clips overlie the mediastinum. Epicardial pacer leads overlie the heart. Partially visualized surgical hardware from ACDF overlies the lower cervical spine. Stable cardiomediastinal silhouette with mild cardiomegaly. No pneumothorax. Small bilateral pleural effusions, slightly decreased bilaterally. Stable mild pulmonary edema. Patchy bibasilar lung opacities appear slightly decreased bilaterally. IMPRESSION: 1. Stable mild cardiomegaly and mild pulmonary edema . 2. Small bilateral pleural effusions, slightly decreased bilaterally. 3. Patchy bibasilar lung opacities, slightly decreased bilaterally, favor atelectasis. Electronically Signed   By: Delbert Phenix M.D.   On: 10/19/2015 08:13   Ct Angio Abd/pel W/ And/or W/o  10/23/2015  CLINICAL DATA:  81 year old female with a history of abdominal pain. Recent CABG. Three vessel CABG performed 10/16/2015 EXAM: CTA ABDOMEN AND PELVIS WITH CONTRAST TECHNIQUE: Multidetector CT imaging of the abdomen and pelvis was performed using the standard protocol during bolus administration of intravenous contrast. Multiplanar reconstructed images and MIPs were obtained and reviewed to evaluate the vascular anatomy. CONTRAST:  75 cc Isovue 370 COMPARISON:  08/17/2014, 05/14/2013 FINDINGS: Lower chest: Partially visualized surgical changes of recent median sternotomy with surgical clips in the anterior mediastinum. Trace infiltration/ fluid compatible a recent surgery. Compared to prior chest x-ray there appears to be interval removal of the  epicardial pacing leads. Re- demonstration of advanced changes of paraseptal/ centrilobular emphysema and scarring. Small bilateral pleural effusions with associated atelectasis. Irregular soft plaque of the distal thoracic aorta. Abdomen/pelvis: Nonvascular: Unremarkable liver, spleen, bilateral adrenal glands. Unremarkable gallbladder. Unremarkable appearance of the pancreas. Bilateral renal cortical thinning, more pronounced on the left. No hydronephrosis or nephrolithiasis. No abnormally distended small bowel or colon. Surgical suture line in the mid small bowel with no transition point. Colonic diverticula with no associated inflammatory changes. Appendix is not visualized, however, no inflammatory changes are present adjacent to the cecum to indicate an appendicitis. No free fluid.  No intraperitoneal air. No inflammatory changes within the mesenteric or within the retroperitoneum. Urinary bladder distention. Vascular: Aorta: Re- demonstration of fusiform infrarenal abdominal aortic aneurysm. Greatest diameter measures 4.8 cm, which is essentially unchanged from the comparison CT of 08/17/2014 when the aneurysm was measured up to 5 cm. No evidence of inflammatory changes adjacent to the aneurysm sac. No fluid. No dissection. Note that the lowest renal artery is the left renal artery with a diameter of the aorta at the level of the left renal artery measuring approximately 1.8 cm. Aortic neck length measures approximately 1.5 cm. Near circumferential mural thrombus in the lower aneurysm sac just  above the bifurcation. Mesenteric vessels: Atherosclerotic changes at the origin of both the celiac artery and superior mesenteric artery. 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 Less than 50% stenosis at the origin of the superior mesenteric artery. Origin of the inferior mesenteric artery appears occluded. Atherosclerotic changes at the origin the right renal artery with at least 50% stenosis.  Atherosclerotic changes at the origin of the left renal artery, with the greatest degree of stenosis at least 1 cm beyond the origin. Perfusion of the kidney parenchyma relatively symmetric. Right lower extremity: Atherosclerotic changes involve the origin of the right common iliac artery. Right common iliac artery diameter measures approximately 8 mm. Dense calcifications extend along the length of the right common iliac artery. Hypogastric artery appears patent including anterior and posterior divisions. External iliac artery of small caliber though patent. Proximal femoral vasculature patent. Left lower extremity: Atherosclerotic changes involve the origin of the left common iliac artery. Left common iliac artery diameter measures 9 mm. No aneurysm or dissection. Hypogastric artery is patent with stenosis at the origin. Posterior division patent. Anterior division occluded. Left external iliac artery is patent. Proximal left femoral vessels patent. Musculoskeletal: No displaced fracture.  Mild degenerative changes of the spine. Degenerative changes of the hips. Review of the MIP images confirms the above findings. IMPRESSION: Vascular: Re- demonstration of infrarenal fusiform abdominal aortic aneurysm with unchanged maximum diameter from the most recent CT of approximately 1 year ago. No evidence of rupture or rapid growth. Maximum diameter measures approximately 4.8 cm on the current study. These findings were discussed with Dr. Fabienne Bruns of Vascular Surgery in person at the time of the study interpretation. No acute findings to account for the patient's new epigastric pain. Partially visualized early surgical changes of the lower mediastinum compatible with history of median sternotomy. Compared to the prior chest x-ray, epicardial pacing leads have been removed. Atherosclerotic changes of the abdominal aorta and mesenteric vessels, as above. Of note, there is likely greater than 50% stenosis of the celiac  artery origin and bilateral renal arteries. Nonvascular: Urinary bladder distention. Small bilateral pleural effusions with associated atelectasis. Re- demonstration of advanced changes of emphysema. Signed, Yvone Neu. Loreta Ave, DO [facility] Radiology Electronically Signed   By: Gilmer Mor D.O.   On: 10/23/2015 09:50    ASSESSMENT/PLAN:  Physical deconditioning - for Home health OT, CNA, Nursing and PT, fall precaution  S/P STEMI - follow-up with cardiology ; continue aspirin 325 mg 1 tab by mouth daily  CAD S/P CABG X 3 - follow-up with cardiothoracic, Dr. Charlett Lango, and vascular surgery, Dr. Leonette Most fields; sternal precaution; continue tramadol 50 mg 1 tab by mouth every 6 hours when necessary for pain  Hyperlipidemia - continue atorvastatin 40 mg 1 tab by mouth daily Lab Results  Component Value Date   CHOL 200 10/07/2015   HDL 85 10/07/2015   LDLCALC 93 10/07/2015   TRIG 112 10/07/2015   CHOLHDL 2.4 10/07/2015   Depression - continue Olanzapine 5 mg 1 tab by mouth daily at bedtime and sertraline 50 mg 1 tab by mouth daily  Atrial fibrillation - continue metoprolol tartrate 25 mg 1 tab by mouth twice a day and aspirin EC 325 mg 1 tab by mouth daily  Anemia, acute blood loss - stable  Lab Results  Component Value Date   WBC 8.3 11/01/2015   HGB 9.2* 11/01/2015   HCT 28* 11/01/2015   MCV 91.1 10/28/2015   PLT  651* 11/01/2015   Leukocytosis -  Resolved  Lab Results  Component Value Date   WBC 8.3 11/01/2015   AKI - creatinine trending down  Lab Results  Component Value Date   CREATININE 1.50* 11/09/2015   Abdominal aneurysm - follow-up with vascular surgery, Dr. Fabienne Bruns; for tentative stent graft placement   COPD - no SOB  Insomnia - continue Melatonin 3 mg PO Q HS     I have filled out patient's discharge paperwork and written prescriptions.  Patient will receive home health PT, OT, Nursing and  CNA.  DME provided:  Bedside Commode  Total discharge time: Greater than 30 minutes  Discharge time involved coordination of the discharge process with social worker, nursing staff and therapy department. Medical justification for home health services/DME verified.    Kenard Gower, NP [employer] [phone]

## 2015-11-19 DIAGNOSIS — Z48812 Encounter for surgical aftercare following surgery on the circulatory system: Secondary | ICD-10-CM

## 2015-11-19 DIAGNOSIS — I714 Abdominal aortic aneurysm, without rupture: Secondary | ICD-10-CM | POA: Diagnosis not present

## 2015-11-19 DIAGNOSIS — I4891 Unspecified atrial fibrillation: Secondary | ICD-10-CM

## 2015-11-19 DIAGNOSIS — I214 Non-ST elevation (NSTEMI) myocardial infarction: Secondary | ICD-10-CM

## 2015-11-22 ENCOUNTER — Telehealth: Payer: Self-pay | Admitting: Cardiology

## 2015-11-22 NOTE — Telephone Encounter (Signed)
[facility] Thayer OhmChris  Reports [facility], [facility]G x[facility] (surgery on 10/16/15).  Since coming home pt has felt weak and SOB with exertion as well as feeling "like her heart is racing".  Denies CP.    PT reports VS today 190/110 after walking ~60 ft, 140/90 sitting at rest, 120/80 standing.  Reports HR remained 80-90 and O2 remained approximately 96%.  PT also reports pts memory was very bad today.    PT requesting MD Herbie BaltimoreHarding sign orders for [facility] post [facility] if possible.  Advised note will be routed to MD for advice.    If need to contact pt-pt request we contact her son Marcial Pacasimothy who is listed on her EC.

## 2015-11-22 NOTE — Telephone Encounter (Signed)
New message   Pt home health PT verbalized that pt BP after walking 60 feet was 190/100  140/90 resting and 120/80 standing

## 2015-11-23 ENCOUNTER — Encounter: Payer: Self-pay | Admitting: Vascular Surgery

## 2015-11-27 ENCOUNTER — Ambulatory Visit: Payer: Medicare Other

## 2015-11-28 ENCOUNTER — Ambulatory Visit: Payer: Medicare Other | Admitting: Thoracic Surgery (Cardiothoracic Vascular Surgery)

## 2015-11-30 ENCOUNTER — Ambulatory Visit (INDEPENDENT_AMBULATORY_CARE_PROVIDER_SITE_OTHER): Payer: PRIVATE HEALTH INSURANCE | Admitting: Vascular Surgery

## 2015-11-30 ENCOUNTER — Encounter: Payer: Self-pay | Admitting: Vascular Surgery

## 2015-11-30 VITALS — BP 129/77 | HR 84 | Temp 97.1°F | Ht 61.5 in | Wt 100.0 lb

## 2015-11-30 DIAGNOSIS — I714 Abdominal aortic aneurysm, without rupture, unspecified: Secondary | ICD-10-CM | POA: Insufficient documentation

## 2015-11-30 NOTE — Progress Notes (Signed)
Referring Physician: [name] MD Patient name: Tonya Payne                   MRN: [id number]                  DOB: 09/27/1934                    Sex: female  REASON FOR CONSULT: AAA  HPI: Tonya Payne is a 81 y.o. female, with known AAA seen in the hospital about one month ago after coronary artery bypass grafting when she had abdominal pain. She states she still has some pain but it is slightly improved. She denies back pain.  She has had no hypotension.  She d is currently very deconditioned.  She is eating some and does not complain of pain with eating.  Other medical problems include COPD, CAD hyperlipidemia all of which are currently stable.      Past Medical History  Diagnosis Date  . COPD (chronic obstructive pulmonary disease) (HCC)   . Anxiety   . Depression   . TIA (transient ischemic attack)   . IBS (irritable bowel syndrome)   . Oral thrush   . CAD (coronary artery disease), native coronary artery 10/08/2015  . Congestive dilated cardiomyopathy (HCC) 10/08/2015  . Hyperlipidemia LDL goal <70 10/08/2015         Past Surgical History  Procedure Laterality Date  . Back surgery    . Neck surgery    . Colectomy    . Cardiac catheterization N/A 10/06/2015    Procedure: Left Heart Cath and Coronary Angiography;  Surgeon: Marykay Lex, MD;  Location: [facility] INVASIVE CV LAB;  Service: Cardiovascular;  Laterality: N/A;  . Cardiac catheterization N/A 10/06/2015    Procedure: Coronary Balloon Angioplasty;  Surgeon: Marykay Lex, MD;  Location: [facility] INVASIVE CV LAB;  Service: Cardiovascular;  Laterality: N/A;  . Coronary artery bypass graft N/A 10/16/2015    Procedure: CORONARY ARTERY BYPASS GRAFTING (CABG)  x three, using left internal mammary artery and right leg greater saphenous vein harvested endoscopically;  Surgeon: Loreli Slot, MD;  Location: [facility] OR;  Service: Open Heart Surgery;  Laterality: N/A;  . Tee without cardioversion N/A 10/16/2015     Procedure: TRANSESOPHAGEAL ECHOCARDIOGRAM (TEE);  Surgeon: Loreli Slot, MD;  Location: [facility] OR;  Service: Open Heart Surgery;  Laterality: N/A;          Family History  Problem Relation Age of Onset  . Stroke Mother     dead  . Clotting disorder Father     dead  bone marrow disease    SOCIAL HISTORY: Social History        Social History  . Marital Status: Single    Spouse Name: N/A  . Number of Children: N/A  . Years of Education: N/A      Occupational History  . Not on file.        Social History Main Topics  . Smoking status: Current Every Day Smoker -- 0.50 packs/day    Types: Cigarettes  . Smokeless tobacco: Never Used  . Alcohol Use: 1.2 oz/week    2 Glasses of wine per week  . Drug Use: No  . Sexual Activity: No       Other Topics Concern  . Not on file   Social History Narrative    No Known Allergies     ROS:   General:  No weight loss, Fever,  chills  Cardiac: + shortness of breath with exertion.    Vascular: No history of rest pain in feet.  No history of claudication.  No history of non-healing ulcer, No history of DVT   Psychological: + history of anxiety,  No history of depression   Physical Examination  Vitals:   11/30/15 1215  BP: 129/77  Pulse: 84  Temp: 97.1 F (36.2 C)  TempSrc: Oral  Weight: 100 lb (45.4 kg)  Height: 5' 1.5" (1.562 m)   Neck: No carotid bruits  Chest: Clear to auscultation bilaterally  Cardiac: Regular rate and rhythm without murmur  Abdomen: Soft mild diffuse tenderness palpable aortic aneurysm mass consistent with abdominal aortic aneurysm  Extremities: 2+ femoral pulses left posterior tibial pulse 2+ absent right pedal pulses  Neuro: Symmetric upper and lower extremity motor strength which is 5 over 5   Prior CT from June 2017 reviewed 5.3 cm AAA infrarenal 2 cm but tortuous anterior angulated neck.  7 mm common femorals. No evidence of rupture, no change  in tissue planes from prior non contrast CT 1 yr ago  ASSESSMENT:  Pt with 5.3 cm AAA amenable to percutaneous Gore Excluder stent graft repair. The patient still feel she is overall 2 deconditioned for this.  PLAN:   She will follow-up in 3 months time for consideration of aneurysm repair at that time if she feels overall she is well enough to proceed   Fabienne Bruns, MD [facility] Office: [phone] Pager: [phone]

## 2015-12-04 NOTE — Telephone Encounter (Signed)
Attempt to call Tonya Payne PT from [facility] to follow up regarding patient concerns on 7/19.  No answer-left general message to call back.

## 2015-12-05 NOTE — Telephone Encounter (Signed)
She was doing fine when she saw Bjorn Loser in follow-up. I will adjust the home health orders when I'm in clinic on Thursday and Friday.  I think if she is feeling her heart is racing or palpitations, it would be be nice if they get an EKG on her.  Otherwise, I think it would be nice to go ahead and order a monitor. She did have postop A. fib, if she is having A. fib, she would be symptomatic.  I only saw her when she was first admitted to the hospital with her MI, have not seen her since.  I think it would be reasonable to go ahead and order a 30 day monitor that can be placed by home health.  Bryan Lemma, MD

## 2015-12-07 NOTE — Telephone Encounter (Signed)
Spoke with [facility] PT regarding update on patient and MD recommendations.  She reports patient is doing better, no longer complaining of palpitations, reports VS have been stable, patient remains generally weak but is improving.   Pt c/o shoulder pain increased with movement per Thayer Ohm and has a xray ordered of her sternum to f/u.   Also Thayer Ohm reports patient was feeling overwhelmed with HH PT, HH OT, and HH RN, therefore D/C [facility] RN yesterday.  Thayer Ohm states if she has any other issues-she would let us know.    Patient has f/u appt 8/14 with [facility] and 10/4 with MD Herbie Baltimore.

## 2015-12-07 NOTE — Telephone Encounter (Signed)
If she has more palpitations, will do 30 day monitor. Otherwise we don't do it.  Shoulder pain doesn't sound cardiac. Sounds musculoskeletal.  Hopefully these will be stable to eye see her in 2 weeks  Bryan Lemma, MD

## 2015-12-10 ENCOUNTER — Other Ambulatory Visit: Payer: Self-pay | Admitting: Adult Health

## 2015-12-12 ENCOUNTER — Ambulatory Visit: Payer: Medicare Other | Admitting: Thoracic Surgery (Cardiothoracic Vascular Surgery)

## 2015-12-14 ENCOUNTER — Other Ambulatory Visit: Payer: Self-pay | Admitting: Thoracic Surgery (Cardiothoracic Vascular Surgery)

## 2015-12-14 DIAGNOSIS — Z951 Presence of aortocoronary bypass graft: Secondary | ICD-10-CM

## 2015-12-18 ENCOUNTER — Ambulatory Visit (INDEPENDENT_AMBULATORY_CARE_PROVIDER_SITE_OTHER): Payer: Self-pay | Admitting: Physician Assistant

## 2015-12-18 ENCOUNTER — Ambulatory Visit
Admission: RE | Admit: 2015-12-18 | Discharge: 2015-12-18 | Disposition: A | Discharge status: Home / Self Care | Payer: Medicare Other | Source: Ambulatory Visit | Attending: Thoracic Surgery (Cardiothoracic Vascular Surgery) | Admitting: Thoracic Surgery (Cardiothoracic Vascular Surgery)

## 2015-12-18 VITALS — BP 147/93 | HR 72 | Resp 18 | Ht 61.5 in | Wt 100.0 lb

## 2015-12-18 DIAGNOSIS — Z951 Presence of aortocoronary bypass graft: Secondary | ICD-10-CM

## 2015-12-18 MED ORDER — ATORVASTATIN CALCIUM 40 MG PO TABS: 40.0000 mg | ORAL_TABLET | Freq: Every day | ORAL | 3 refills | Status: DC

## 2015-12-18 MED ORDER — METOPROLOL TARTRATE 25 MG PO TABS: 25.0000 mg | ORAL_TABLET | Freq: Two times a day (BID) | ORAL | 3 refills | Status: DC

## 2015-12-18 NOTE — Progress Notes (Deleted)
PCP is Olivia MackieWilliam Cho, MD Referring Provider is Jake BatheSkains, Mark C, MD  Chief Complaint  Patient presents with  . Routine Post Op    f/u from surgery with CXR s/p CABG x 3     HPI:   Past Medical History:  Diagnosis Date  . AAA (abdominal aortic aneurysm) (HCC)   . Abdominal aneurysm (HCC)    CTA 10/23/2015  . Anxiety   . Atrial fibrillation, new onset (HCC) 10/2015   after CABG, in SR at d/c  . CAD (coronary artery disease), native coronary artery 10/08/2015  . Congestive dilated cardiomyopathy (HCC) 10/08/2015  . COPD (chronic obstructive pulmonary disease) (HCC)   . Depression   . Hyperlipidemia LDL goal <70 10/08/2015  . IBS (irritable bowel syndrome)   . Oral thrush   . Post PTCA   . TIA (transient ischemic attack)     Past Surgical History:  Procedure Laterality Date  . BACK SURGERY    . CARDIAC CATHETERIZATION N/A 10/06/2015   Procedure: Left Heart Cath and Coronary Angiography;  Surgeon: Marykay Lexavid W Harding, MD;  [facility];  Service: Cardiovascular;  Laterality: N/A;  . CARDIAC CATHETERIZATION N/A 10/06/2015   Procedure: Coronary Balloon Angioplasty;  Surgeon: Marykay Lexavid W Harding, MD;  [facility];  Service: Cardiovascular;  Laterality: N/A;  . COLECTOMY    . CORONARY ARTERY BYPASS GRAFT N/A 10/16/2015   Procedure: CORONARY ARTERY BYPASS GRAFTING (CABG)  x three, using left internal mammary artery and right leg greater saphenous vein harvested endoscopically;  Surgeon: Loreli SlotSteven C Hendrickson, MD;  [facility];  Service: Open Heart Surgery;  Laterality: N/A;  . NECK SURGERY    . TEE WITHOUT CARDIOVERSION N/A 10/16/2015   Procedure: TRANSESOPHAGEAL ECHOCARDIOGRAM (TEE);  Surgeon: Loreli SlotSteven C Hendrickson, MD;  [facility];  Service: Open Heart Surgery;  Laterality: N/A;    Family History  Problem Relation Age of Onset  . Stroke Mother     dead  . Clotting disorder Father     dead  bone marrow disease    Social History Social History  Substance  Use Topics  . Smoking status: Former Smoker    Packs/day: 0.50    Types: Cigarettes    Quit date: 04/11/2015  . Smokeless tobacco: Never Used  . Alcohol use 1.2 oz/week    2 Glasses of wine per week    Current Outpatient Prescriptions  Medication Sig Dispense Refill  . acetaminophen (TYLENOL) 325 MG tablet Take by mouth every 4 (four) hours as needed for mild pain or moderate pain.    Marland Kitchen. aspirin EC 325 MG EC tablet Take 1 tablet (325 mg total) by mouth daily.    Marland Kitchen. atorvastatin (LIPITOR) 40 MG tablet Take 1 tablet (40 mg total) by mouth daily at 6 PM. 30 tablet 3  . Fe Fum-Fe Poly-Vit C-Lactobac (FUSION PO) Take 1 tablet by mouth daily.    . Melatonin 3 MG TABS Take 3 mg by mouth at bedtime.    . metoprolol tartrate (LOPRESSOR) 25 MG tablet Take 1 tablet (25 mg total) by mouth 2 (two) times daily. 60 tablet 3  . OLANZapine (ZYPREXA) 5 MG tablet Take 5 mg by mouth at bedtime.    . phenazopyridine (PYRIDIUM) 200 MG tablet Take 200 mg by mouth 3 (three) times daily as needed for pain.    . phenylephrine-shark liver oil-mineral oil-petrolatum (PREPARATION H) 0.25-3-14-71.9 % rectal ointment Place 1 application rectally daily as needed for hemorrhoids.    .Marland Kitchen  polyethylene glycol (MIRALAX / GLYCOLAX) packet Take 17 g by mouth daily.    . sennosides-docusate sodium (SENOKOT-S) 8.6-50 MG tablet Take 2 tablets by mouth 2 (two) times daily.    . sertraline (ZOLOFT) 50 MG tablet Take 50 mg by mouth every morning.    . traMADol (ULTRAM) 50 MG tablet Take 50 mg by mouth every 6 (six) hours as needed.    Marland Kitchen. UNABLE TO FIND Med Name: MedPass 120 mL TID     No current facility-administered medications for this visit.    Facility-Administered Medications Ordered in Other Visits  Medication Dose Route Frequency Provider Last Rate Last Dose  . bivalirudin (ANGIOMAX) 250 mg in sodium chloride 0.9 % 50 mL (5 mg/mL) infusion  250 mg  Continuous PRN Marykay Lexavid W Harding, MD   Stopped at 10/06/15 1556  . bivalirudin  (ANGIOMAX) BOLUS via infusion    PRN Marykay Lexavid W Harding, MD   27.225 mg at 10/06/15 1504  . fentaNYL (SUBLIMAZE) injection    PRN Marykay Lexavid W Harding, MD   25 mcg at 10/06/15 1529  . heparin infusion 2 units/mL in 0.9 % sodium chloride    Continuous PRN Marykay Lexavid W Harding, MD   1,500 mL at 10/06/15 1454  . iopamidol (ISOVUE-370) 76 % injection    PRN Marykay Lexavid W Harding, MD   150 mL at 10/06/15 1554  . labetalol (NORMODYNE,TRANDATE) injection    PRN Marykay Lexavid W Harding, MD   10 mg at 10/06/15 1526  . lidocaine (PF) (XYLOCAINE) 1 % injection    PRN Marykay Lexavid W Harding, MD   2 mL at 10/06/15 1454  . nitroGLYCERIN 1 mg/10 ml (100 mcg/ml) - IR/CATH LAB    PRN Marykay Lexavid W Harding, MD   200 mcg at 10/06/15 1525  . ondansetron (ZOFRAN) injection    PRN Marykay Lexavid W Harding, MD   4 mg at 10/06/15 1453  . Radial Cocktail (Verapamil 2.5 mg, NTG, Lidocaine)    PRN Marykay Lexavid W Harding, MD      . ticagrelor [facility](BRILINTA) tablet    PRN Marykay Lexavid W Harding, MD   180 mg at 10/06/15 1506  . tirofiban (AGGRASTAT) bolus via infusion    PRN Marykay Lexavid W Harding, MD   907.5 mcg at 10/06/15 1558  . tirofiban (AGGRASTAT) infusion 50 mcg/mL 100 mL    Continuous PRN Marykay Lexavid W Harding, MD 3.3 mL/hr at 10/06/15 1606 0.075 mcg/kg/min at 10/06/15 1606    No Known Allergies  Review of Systems  BP (!) 147/93 (BP Location: Right Arm, Patient Position: Sitting, Cuff Size: Small)   Pulse 72   Resp 18   Ht 5' 1.5" (1.562 m)   Wt 100 lb (45.4 kg)   SpO2 97% Comment: RA  BMI 18.59 kg/m  Physical Exam   Diagnostic Tests:   Impression:   Plan:   Lowella DandyBARRETT, Corrin Hingle, PA-C [facility] [phone]

## 2015-12-18 NOTE — Progress Notes (Signed)
HPI: Patient returns for routine postoperative follow-up having undergone CABG x 3 on 10/16/2015.The patient's early postoperative recovery while in the hospital was notable for acute renal insufficiency, Atrial Fibrillation, and blood loss anemia requiring transfusion.  Since hospital discharge the patient reports that she is doing well.  [facility].  She states she feels great.  She does continue to have some numbness along her right lower leg.  She has some left side musculoskeletal discomfort from a pulled muscle.  She is ambulating independently.  She denies chest pain and shortness of breath.  She continues to have some mild palpitations.  She does ask for refills on her Lipitor and Lopressor.  Current Outpatient Prescriptions  Medication Sig Dispense Refill  . acetaminophen (TYLENOL) 325 MG tablet Take by mouth every 4 (four) hours as needed for mild pain or moderate pain.    Marland Kitchen. aspirin EC 325 MG EC tablet Take 1 tablet (325 mg total) by mouth daily.    Marland Kitchen. atorvastatin (LIPITOR) 40 MG tablet Take 1 tablet (40 mg total) by mouth daily at 6 PM. 30 tablet 3  . Fe Fum-Fe Poly-Vit C-Lactobac (FUSION PO) Take 1 tablet by mouth daily.    . Melatonin 3 MG TABS Take 3 mg by mouth at bedtime.    . metoprolol tartrate (LOPRESSOR) 25 MG tablet Take 1 tablet (25 mg total) by mouth 2 (two) times daily. 60 tablet 3  . OLANZapine (ZYPREXA) 5 MG tablet Take 5 mg by mouth at bedtime.    . phenazopyridine (PYRIDIUM) 200 MG tablet Take 200 mg by mouth 3 (three) times daily as needed for pain.    . phenylephrine-shark liver oil-mineral oil-petrolatum (PREPARATION H) 0.25-3-14-71.9 % rectal ointment Place 1 application rectally daily as needed for hemorrhoids.    . polyethylene glycol (MIRALAX / GLYCOLAX) packet Take 17 g by mouth daily.    . sennosides-docusate sodium (SENOKOT-S) 8.6-50 MG tablet Take 2 tablets by mouth 2 (two) times daily.    . sertraline (ZOLOFT) 50 MG tablet Take 50 mg by  mouth every morning.    . traMADol (ULTRAM) 50 MG tablet Take 50 mg by mouth every 6 (six) hours as needed.    Marland Kitchen. UNABLE TO FIND Med Name: MedPass 120 mL TID     No current facility-administered medications for this visit.    Facility-Administered Medications Ordered in Other Visits  Medication Dose Route Frequency Provider Last Rate Last Dose  . bivalirudin (ANGIOMAX) 250 mg in sodium chloride 0.9 % 50 mL (5 mg/mL) infusion  250 mg  Continuous PRN Marykay Lexavid W Harding, MD   Stopped at 10/06/15 1556  . bivalirudin (ANGIOMAX) BOLUS via infusion    PRN Marykay Lexavid W Harding, MD   27.225 mg at 10/06/15 1504  . fentaNYL (SUBLIMAZE) injection    PRN Marykay Lexavid W Harding, MD   25 mcg at 10/06/15 1529  . heparin infusion 2 units/mL in 0.9 % sodium chloride    Continuous PRN Marykay Lexavid W Harding, MD   1,500 mL at 10/06/15 1454  . iopamidol (ISOVUE-370) 76 % injection    PRN Marykay Lexavid W Harding, MD   150 mL at 10/06/15 1554  . labetalol (NORMODYNE,TRANDATE) injection    PRN Marykay Lexavid W Harding, MD   10 mg at 10/06/15 1526  . lidocaine (PF) (XYLOCAINE) 1 % injection    PRN Marykay Lexavid W Harding, MD   2 mL at 10/06/15 1454  . nitroGLYCERIN 1 mg/10 ml (100 mcg/ml) - IR/CATH LAB  PRN Marykay Lexavid W Harding, MD   200 mcg at 10/06/15 1525  . ondansetron (ZOFRAN) injection    PRN Marykay Lexavid W Harding, MD   4 mg at 10/06/15 1453  . Radial Cocktail (Verapamil 2.5 mg, NTG, Lidocaine)    PRN Marykay Lexavid W Harding, MD      . ticagrelor [facility](BRILINTA) tablet    PRN Marykay Lexavid W Harding, MD   180 mg at 10/06/15 1506  . tirofiban (AGGRASTAT) bolus via infusion    PRN Marykay Lexavid W Harding, MD   907.5 mcg at 10/06/15 1558  . tirofiban (AGGRASTAT) infusion 50 mcg/mL 100 mL    Continuous PRN Marykay Lexavid W Harding, MD 3.3 mL/hr at 10/06/15 1606 0.075 mcg/kg/min at 10/06/15 1606    Physical Exam:  BP (!) 147/93 (BP Location: Right Arm, Patient Position: Sitting, Cuff Size: Small)   Pulse 72   Resp 18   Ht 5' 1.5" (1.562 m)   Wt 100 lb (45.4 kg)   SpO2 97% Comment: RA  BMI 18.59 kg/m    Gen: no apparent distress Heart: RRR, Lungs: CTA bilaterally Skin: incisions well healed, no signs/symptoms of infection  Diagnostic Tests:  CXR: resolution of previous pleural effusions, sternal wires intact, minimal atelectasis present  A/P:  1. S/P CABG- doing very well... Will give refill for Lipitor 40 mg daily and Lopressor 25 mg BID 2. Activity-continue as tolerated 3. Palpitations- cardiology is following, will place event monitor if symptoms persist 4. Dispo- patient doing well, RTC prn   Martice Doty, PA-C [facility] [phone]

## 2016-01-26 ENCOUNTER — Emergency Department (HOSPITAL_BASED_OUTPATIENT_CLINIC_OR_DEPARTMENT_OTHER): Payer: Medicare Other

## 2016-01-26 ENCOUNTER — Inpatient Hospital Stay (HOSPITAL_BASED_OUTPATIENT_CLINIC_OR_DEPARTMENT_OTHER)
Admission: EM | Admit: 2016-01-26 | Discharge: 2016-01-29 | DRG: 536 | Disposition: A | Discharge status: Skilled Nursing Facility | Payer: Medicare Other | Attending: Internal Medicine | Admitting: Internal Medicine

## 2016-01-26 ENCOUNTER — Encounter (HOSPITAL_BASED_OUTPATIENT_CLINIC_OR_DEPARTMENT_OTHER): Payer: Self-pay | Admitting: *Deleted

## 2016-01-26 DIAGNOSIS — S6291XA Unspecified fracture of right wrist and hand, initial encounter for closed fracture: Secondary | ICD-10-CM

## 2016-01-26 DIAGNOSIS — W19XXXA Unspecified fall, initial encounter: Secondary | ICD-10-CM | POA: Diagnosis not present

## 2016-01-26 DIAGNOSIS — Z7982 Long term (current) use of aspirin: Secondary | ICD-10-CM | POA: Diagnosis not present

## 2016-01-26 DIAGNOSIS — S32512A Fracture of superior rim of left pubis, initial encounter for closed fracture: Principal | ICD-10-CM | POA: Diagnosis present

## 2016-01-26 DIAGNOSIS — Z79899 Other long term (current) drug therapy: Secondary | ICD-10-CM | POA: Diagnosis not present

## 2016-01-26 DIAGNOSIS — S62354A Nondisplaced fracture of shaft of fourth metacarpal bone, right hand, initial encounter for closed fracture: Secondary | ICD-10-CM | POA: Diagnosis present

## 2016-01-26 DIAGNOSIS — Z66 Do not resuscitate: Secondary | ICD-10-CM | POA: Diagnosis present

## 2016-01-26 DIAGNOSIS — I25119 Atherosclerotic heart disease of native coronary artery with unspecified angina pectoris: Secondary | ICD-10-CM | POA: Diagnosis present

## 2016-01-26 DIAGNOSIS — I2510 Atherosclerotic heart disease of native coronary artery without angina pectoris: Secondary | ICD-10-CM | POA: Diagnosis present

## 2016-01-26 DIAGNOSIS — W010XXA Fall on same level from slipping, tripping and stumbling without subsequent striking against object, initial encounter: Secondary | ICD-10-CM | POA: Diagnosis present

## 2016-01-26 DIAGNOSIS — I714 Abdominal aortic aneurysm, without rupture, unspecified: Secondary | ICD-10-CM | POA: Diagnosis present

## 2016-01-26 DIAGNOSIS — I4891 Unspecified atrial fibrillation: Secondary | ICD-10-CM | POA: Diagnosis present

## 2016-01-26 DIAGNOSIS — R296 Repeated falls: Secondary | ICD-10-CM | POA: Diagnosis present

## 2016-01-26 DIAGNOSIS — E871 Hypo-osmolality and hyponatremia: Secondary | ICD-10-CM | POA: Diagnosis present

## 2016-01-26 DIAGNOSIS — S62302A Unspecified fracture of third metacarpal bone, right hand, initial encounter for closed fracture: Secondary | ICD-10-CM | POA: Diagnosis present

## 2016-01-26 DIAGNOSIS — E785 Hyperlipidemia, unspecified: Secondary | ICD-10-CM | POA: Diagnosis present

## 2016-01-26 DIAGNOSIS — E860 Dehydration: Secondary | ICD-10-CM | POA: Diagnosis present

## 2016-01-26 DIAGNOSIS — I420 Dilated cardiomyopathy: Secondary | ICD-10-CM | POA: Diagnosis present

## 2016-01-26 DIAGNOSIS — S32511A Fracture of superior rim of right pubis, initial encounter for closed fracture: Secondary | ICD-10-CM | POA: Diagnosis present

## 2016-01-26 DIAGNOSIS — S32591S Other specified fracture of right pubis, sequela: Secondary | ICD-10-CM | POA: Diagnosis not present

## 2016-01-26 DIAGNOSIS — Z8673 Personal history of transient ischemic attack (TIA), and cerebral infarction without residual deficits: Secondary | ICD-10-CM | POA: Diagnosis not present

## 2016-01-26 DIAGNOSIS — Y92190 Kitchen in other specified residential institution as the place of occurrence of the external cause: Secondary | ICD-10-CM

## 2016-01-26 DIAGNOSIS — Z832 Family history of diseases of the blood and blood-forming organs and certain disorders involving the immune mechanism: Secondary | ICD-10-CM | POA: Diagnosis not present

## 2016-01-26 DIAGNOSIS — F329 Major depressive disorder, single episode, unspecified: Secondary | ICD-10-CM | POA: Diagnosis present

## 2016-01-26 DIAGNOSIS — Z823 Family history of stroke: Secondary | ICD-10-CM

## 2016-01-26 DIAGNOSIS — N183 Chronic kidney disease, stage 3 (moderate): Secondary | ICD-10-CM | POA: Diagnosis present

## 2016-01-26 DIAGNOSIS — J449 Chronic obstructive pulmonary disease, unspecified: Secondary | ICD-10-CM | POA: Diagnosis present

## 2016-01-26 DIAGNOSIS — Z87891 Personal history of nicotine dependence: Secondary | ICD-10-CM | POA: Diagnosis not present

## 2016-01-26 DIAGNOSIS — Z951 Presence of aortocoronary bypass graft: Secondary | ICD-10-CM | POA: Diagnosis not present

## 2016-01-26 DIAGNOSIS — I252 Old myocardial infarction: Secondary | ICD-10-CM

## 2016-01-26 DIAGNOSIS — S32810A Multiple fractures of pelvis with stable disruption of pelvic ring, initial encounter for closed fracture: Secondary | ICD-10-CM | POA: Diagnosis not present

## 2016-01-26 DIAGNOSIS — S329XXA Fracture of unspecified parts of lumbosacral spine and pelvis, initial encounter for closed fracture: Secondary | ICD-10-CM | POA: Diagnosis present

## 2016-01-26 DIAGNOSIS — S62320A Displaced fracture of shaft of second metacarpal bone, right hand, initial encounter for closed fracture: Secondary | ICD-10-CM | POA: Diagnosis present

## 2016-01-26 DIAGNOSIS — Z23 Encounter for immunization: Secondary | ICD-10-CM

## 2016-01-26 DIAGNOSIS — S6290XA Unspecified fracture of unspecified wrist and hand, initial encounter for closed fracture: Secondary | ICD-10-CM

## 2016-01-26 LAB — BASIC METABOLIC PANEL
Anion gap: 9 (ref 5–15)
BUN: 22 mg/dL — AB (ref 6–20)
CHLORIDE: 97 mmol/L — AB (ref 101–111)
CO2: 23 mmol/L (ref 22–32)
CREATININE: 1.06 mg/dL — AB (ref 0.44–1.00)
Calcium: 9.2 mg/dL (ref 8.9–10.3)
GFR calc Af Amer: 56 mL/min — ABNORMAL LOW (ref >60)
GFR calc non Af Amer: 49 mL/min — ABNORMAL LOW (ref >60)
GLUCOSE: 112 mg/dL — AB (ref 65–99)
Potassium: 4.5 mmol/L (ref 3.5–5.1)
Sodium: 129 mmol/L — ABNORMAL LOW (ref 135–145)

## 2016-01-26 LAB — CBC WITH DIFFERENTIAL/PLATELET
Basophils Absolute: 0 10*3/uL (ref 0.0–0.1)
Basophils Relative: 0 %
EOS ABS: 0.3 10*3/uL (ref 0.0–0.7)
Eosinophils Relative: 3 %
HEMATOCRIT: 32.2 % — AB (ref 36.0–46.0)
HEMOGLOBIN: 10.8 g/dL — AB (ref 12.0–15.0)
LYMPHS ABS: 1.3 10*3/uL (ref 0.7–4.0)
LYMPHS PCT: 12 %
MCH: 30.2 pg (ref 26.0–34.0)
MCHC: 33.5 g/dL (ref 30.0–36.0)
MCV: 89.9 fL (ref 78.0–100.0)
MONOS PCT: 10 %
Monocytes Absolute: 1.1 10*3/uL — ABNORMAL HIGH (ref 0.1–1.0)
NEUTROS PCT: 75 %
Neutro Abs: 8.4 10*3/uL — ABNORMAL HIGH (ref 1.7–7.7)
Platelets: 300 10*3/uL (ref 150–400)
RBC: 3.58 MIL/uL — ABNORMAL LOW (ref 3.87–5.11)
RDW: 14.1 % (ref 11.5–15.5)
WBC: 11.1 10*3/uL — ABNORMAL HIGH (ref 4.0–10.5)

## 2016-01-26 MED ORDER — ACETAMINOPHEN 500 MG PO TABS
1000.0000 mg | ORAL_TABLET | Freq: Once | ORAL | Status: AC
  Administered 2016-01-26: 1000 mg via ORAL
  Filled 2016-01-26: qty 2

## 2016-01-26 NOTE — ED Provider Notes (Signed)
[facility] Provider Note   CSN: [id number] Arrival date & time: 01/26/16  2002  By signing my name below, I, Rosario Adie, attest that this documentation has been prepared under the direction and in the presence of Nira Conn, MD. Electronically Signed: Rosario Adie, ED Scribe. 01/26/16. Marland Kitchen  History   Chief Complaint Chief Complaint  Patient presents with  . Fall   The history is provided by the patient. No language interpreter was used.   HPI Comments: Tonya Payne is a 81 y.o. female BIB EMS, with a PMHx AAA, AFIB, CAD s/p CABG, cardiac catheterization, and STEMI, who presents to the Emergency Department complaining of diffuse bodily pain s/p unwitnessed fall that occured ~1.5 hours ago. Pt reports that she was grabbing something out of her refrigerator when she suddenly turned to her right and tripped over one of her feet, causing her to fall on her right side. She states that upon falling she landed on her right-sided back with her right arm under her, and she struck her head on the ground, sustaining her pain. Denies LOC otherwise. During her fall she notes that she also sustained a wound to her right elbow. Bleeding is controlled with pressure. Pt reports that immediately after the fall that she crawled on the ground to her phone to call her neighbor who called EMS. She states that she has not been ambulatory since the accident. Pt is not on anticoagulant/antiplatlet therapy.  She currently lives in an independent living facility and ambulates with a walker at baseline. NKDA. Denies focal numbness, SOB, or any other associated symptoms.   Past Medical History:  Diagnosis Date  . AAA (abdominal aortic aneurysm) (HCC)   . Abdominal aneurysm (HCC)    CTA 10/23/2015  . Anxiety   . Atrial fibrillation, new onset (HCC) 10/2015   after CABG, in SR at d/c  . CAD (coronary artery disease), native coronary artery 10/08/2015  . Congestive dilated  cardiomyopathy (HCC) 10/08/2015  . COPD (chronic obstructive pulmonary disease) (HCC)   . Depression   . Hyperlipidemia LDL goal <70 10/08/2015  . IBS (irritable bowel syndrome)   . Oral thrush   . Post PTCA   . TIA (transient ischemic attack)    Patient Active Problem List   Diagnosis Date Noted  . Pelvic fracture (HCC) 01/26/2016  . AAA (abdominal aortic aneurysm) without rupture (HCC) 11/30/2015  . Abdominal aneurysm (HCC)   . Post PTCA   . S/P CABG x 3 10/16/2015  . CAD (coronary artery disease), native coronary artery 10/08/2015  . Congestive dilated cardiomyopathy (HCC) 10/08/2015  . Hyperlipidemia LDL goal <70 10/08/2015  . UTI (urinary tract infection) 10/06/2015  . ST elevation (STEMI) myocardial infarction involving left anterior descending coronary artery (HCC) 10/06/2015   Past Surgical History:  Procedure Laterality Date  . BACK SURGERY    . CARDIAC CATHETERIZATION N/A 10/06/2015   Procedure: Left Heart Cath and Coronary Angiography;  Surgeon: Marykay Lex, MD;  Location: [facility] INVASIVE CV LAB;  Service: Cardiovascular;  Laterality: N/A;  . CARDIAC CATHETERIZATION N/A 10/06/2015   Procedure: Coronary Balloon Angioplasty;  Surgeon: Marykay Lex, MD;  Location: [facility] INVASIVE CV LAB;  Service: Cardiovascular;  Laterality: N/A;  . COLECTOMY    . CORONARY ARTERY BYPASS GRAFT N/A 10/16/2015   Procedure: CORONARY ARTERY BYPASS GRAFTING (CABG)  x three, using left internal mammary artery and right leg greater saphenous vein harvested endoscopically;  Surgeon: Loreli Slot, MD;  Location: [facility]  OR;  Service: Open Heart Surgery;  Laterality: N/A;  . NECK SURGERY    . TEE WITHOUT CARDIOVERSION N/A 10/16/2015   Procedure: TRANSESOPHAGEAL ECHOCARDIOGRAM (TEE);  Surgeon: Loreli SlotSteven C Hendrickson, MD;  [facility];  Service: Open Heart Surgery;  Laterality: N/A;   OB History    No data available     Home Medications    Prior to Admission medications   Medication Sig Start Date  End Date Taking? Authorizing Provider  acetaminophen (TYLENOL) 325 MG tablet Take by mouth every 4 (four) hours as needed for mild pain or moderate pain.    Historical Provider, MD  aspirin EC 325 MG EC tablet Take 1 tablet (325 mg total) by mouth daily. 10/30/15   Wayne E Gold, PA-C  atorvastatin (LIPITOR) 40 MG tablet Take 1 tablet (40 mg total) by mouth daily at 6 PM. 12/18/15   Erin R Barrett, PA-C  Fe Fum-Fe Poly-Vit C-Lactobac (FUSION PO) Take 1 tablet by mouth daily.    Historical Provider, MD  Melatonin 3 MG TABS Take 3 mg by mouth at bedtime.    Historical Provider, MD  metoprolol tartrate (LOPRESSOR) 25 MG tablet Take 1 tablet (25 mg total) by mouth 2 (two) times daily. 12/18/15   Erin R Barrett, PA-C  OLANZapine (ZYPREXA) 5 MG tablet Take 5 mg by mouth at bedtime.    Historical Provider, MD  phenazopyridine (PYRIDIUM) 200 MG tablet Take 200 mg by mouth 3 (three) times daily as needed for pain.    Historical Provider, MD  phenylephrine-shark liver oil-mineral oil-petrolatum (PREPARATION H) 0.25-3-14-71.9 % rectal ointment Place 1 application rectally daily as needed for hemorrhoids.    Historical Provider, MD  polyethylene glycol (MIRALAX / GLYCOLAX) packet Take 17 g by mouth daily.    Historical Provider, MD  sennosides-docusate sodium (SENOKOT-S) 8.6-50 MG tablet Take 2 tablets by mouth 2 (two) times daily.    Historical Provider, MD  sertraline (ZOLOFT) 50 MG tablet Take 50 mg by mouth every morning.    Historical Provider, MD  traMADol (ULTRAM) 50 MG tablet Take 50 mg by mouth every 6 (six) hours as needed.    Historical Provider, MD  UNABLE TO FIND Med Name: MedPass 120 mL TID    Historical Provider, MD   Family History Family History  Problem Relation Age of Onset  . Stroke Mother     dead  . Clotting disorder Father     dead  bone marrow disease   Social History Social History  Substance Use Topics  . Smoking status: Former Smoker    Packs/day: 0.50    Types: Cigarettes     Quit date: 04/11/2015  . Smokeless tobacco: Never Used  . Alcohol use 1.2 oz/week    2 Glasses of wine per week   Allergies   Review of patient's allergies indicates no known allergies.  Review of Systems Review of Systems  Respiratory: Negative for shortness of breath.   Musculoskeletal: Positive for arthralgias, back pain, myalgias and neck pain.  Skin: Positive for wound.  Neurological: Negative for syncope.  All other systems reviewed and are negative.  Physical Exam Updated Vital Signs BP 150/74   Pulse 69   Temp 98.4 F (36.9 C) (Oral)   Resp 18   Wt 100 lb (45.4 kg)   SpO2 95%   BMI 18.59 kg/m   Physical Exam  Constitutional: She is oriented to person, place, and time. She appears well-developed and well-nourished. No distress.  HENT:  Head: Normocephalic  and atraumatic.  Right Ear: External ear normal.  Left Ear: External ear normal.  Nose: Nose normal.  Eyes: Conjunctivae and EOM are normal. Pupils are equal, round, and reactive to light. Right eye exhibits no discharge. Left eye exhibits no discharge. No scleral icterus.  Neck: Normal range of motion. Neck supple.  Cardiovascular: Normal rate, regular rhythm and normal heart sounds.  Exam reveals no gallop and no friction rub.   No murmur heard. Pulses:      Radial pulses are 2+ on the right side, and 2+ on the left side.       Dorsalis pedis pulses are 2+ on the right side, and 2+ on the left side.  Pulmonary/Chest: Effort normal and breath sounds normal. No stridor. No respiratory distress. She has no wheezes.  Abdominal: Soft. She exhibits no distension. There is no tenderness.  Musculoskeletal: She exhibits tenderness.       Cervical back: She exhibits no bony tenderness.       Thoracic back: She exhibits no bony tenderness.       Lumbar back: She exhibits no bony tenderness.  Clavicles stable. Chest stable to AP/Lat compression. Pelvis stable to Lat compression. No obvious extremity deformity.  Mild  tenderness over the c-spine. Hematoma over the dorsum of the secondary and third metacarpal with TTP of the area. Neurovascularly intact. TTP over the right sacrum. Pain with passive right leg raise. Leg length is symmetric and un-rotated. TTP additionally over the right elbow at that ulnar head of the olecranon process. Skin tear of the right elbow noted.   Neurological: She is alert and oriented to person, place, and time.  Moving all extremities  Skin: Skin is warm and dry. No rash noted. She is not diaphoretic. No erythema.  Psychiatric: She has a normal mood and affect.   ED Treatments / Results  DIAGNOSTIC STUDIES: Oxygen Saturation is 97% on RA, normal by my interpretation.   COORDINATION OF CARE: 8:37 PM-Discussed next steps with pt. Pt verbalized understanding and is agreeable with the plan.   Labs (all labs ordered are listed, but only abnormal results are displayed) Labs Reviewed  CBC WITH DIFFERENTIAL/PLATELET - Abnormal; Notable for the following:       Result Value   WBC 11.1 (*)    RBC 3.58 (*)    Hemoglobin 10.8 (*)    HCT 32.2 (*)    Neutro Abs 8.4 (*)    Monocytes Absolute 1.1 (*)    All other components within normal limits  BASIC METABOLIC PANEL - Abnormal; Notable for the following:    Sodium 129 (*)    Chloride 97 (*)    Glucose, Bld 112 (*)    BUN 22 (*)    Creatinine, Ser 1.06 (*)    GFR calc non Af Amer 49 (*)    GFR calc Af Amer 56 (*)    All other components within normal limits    EKG  EKG Interpretation None      Radiology Dg Elbow Complete Right (3+view)  Result Date: 01/26/2016 CLINICAL DATA:  Right elbow pain with laceration to the olecranon area after a fall today. EXAM: RIGHT ELBOW - COMPLETE 3+ VIEW COMPARISON:  None. FINDINGS: There is no evidence of fracture, dislocation, or joint effusion. There is no evidence of arthropathy or other focal bone abnormality. Soft tissues are unremarkable. IMPRESSION: Negative. Electronically Signed    By: Burman Nieves M.D.   On: 01/26/2016 21:41   Dg Wrist Complete Right  Result Date: 01/26/2016 CLINICAL DATA:  Right wrist pain after a fall today. EXAM: RIGHT WRIST - COMPLETE 3+ VIEW COMPARISON:  02/25/2015 FINDINGS: Degenerative changes in the radiocarpal and STT joints. No fractures demonstrated in the carpal bones. Transverse fractures demonstrated in the distal right second and fourth metacarpal bones. Vague oblique linear lucency in the proximal second metacarpal bone could also represent nondisplaced fracture although this is not confirmed on all views. IMPRESSION: Degenerative changes in the right wrist. Fractures demonstrated in the right second and fourth metacarpal bones at the distal shaft. Possible nondisplaced fracture of the proximal second metacarpal bone. Carpal bones appear intact. Electronically Signed   By: Burman Nieves M.D.   On: 01/26/2016 21:47   Ct Head Wo Contrast  Result Date: 01/26/2016 CLINICAL DATA:  Status post fall to right side with head injury. EXAM: CT HEAD WITHOUT CONTRAST CT CERVICAL SPINE WITHOUT CONTRAST TECHNIQUE: Multidetector CT imaging of the head and cervical spine was performed following the standard protocol without intravenous contrast. Multiplanar CT image reconstructions of the cervical spine were also generated. COMPARISON:  CT head and cervical spine 08/17/2014. FINDINGS: CT HEAD FINDINGS Brain: There has been interval development of lucencies within left lentiform nucleus and right posterior limb of internal capsule consistent with age-indeterminate lacunar infarcts. Additionally there is a chronic appearing infarct in the right cerebellar hemisphere that is new. Stable background of moderate chronic microvascular ischemic changes and mild parenchymal volume loss. No evidence of large acute infarct, hemorrhage, hydrocephalus, extra-axial collection, for focal mass effect. Vascular: No hyperdense vessel. Calcific atherosclerosis carotid siphons.  Skull: Normal. Negative for fracture or focal lesion. Sinuses/Orbits: No acute finding. Other: None. CT CERVICAL SPINE FINDINGS Alignment: Normal cervical lordosis. Minimal stable C7-T1 anterolisthesis. Anterior cervical discectomy and fusion of C5 through C7. Hardware appears intact. Skull base and vertebrae: No acute fracture. No primary bone lesion or focal pathologic process. Soft tissues and spinal canal: No lymphadenopathy or discrete cervical mass identified on this noncontrast study. The aerodigestive tract is patent. Normal thyroid gland. Dense calcifications of the carotid bifurcations bilaterally. No prevertebral fluid or swelling. No visible canal hematoma.- Disc levels: Degenerative changes of the cervical spine mild multilevel facet arthropathy and discogenic changes with marginal osteophytes and disc space narrowing most pronounced at C7-T1. Disc bulges at the C3-4 and C4-5 levels. Upper chest: Scarring with calcifications in the lung apices. Other: None. IMPRESSION: 1. No skull fracture or intracranial hemorrhage identified. 2. Interval age-indeterminate lacunar infarcts in the basal ganglia bilaterally from 2016. 3. Interval chronic appearing small right occipital lobe infarct. 4. Stable background of moderate chronic microvascular ischemic changes and mild parenchymal volume loss. 5. No acute fracture or dislocation of the cervical spine. 6. Multilevel cervical degenerative changes and intact anterior cervical discectomy and fusion hardware are stable. Electronically Signed   By: Mitzi Hansen M.D.   On: 01/26/2016 22:09   Ct Cervical Spine Wo Contrast  Result Date: 01/26/2016 CLINICAL DATA:  Status post fall to right side with head injury. EXAM: CT HEAD WITHOUT CONTRAST CT CERVICAL SPINE WITHOUT CONTRAST TECHNIQUE: Multidetector CT imaging of the head and cervical spine was performed following the standard protocol without intravenous contrast. Multiplanar CT image reconstructions  of the cervical spine were also generated. COMPARISON:  CT head and cervical spine 08/17/2014. FINDINGS: CT HEAD FINDINGS Brain: There has been interval development of lucencies within left lentiform nucleus and right posterior limb of internal capsule consistent with age-indeterminate lacunar infarcts. Additionally there is a chronic appearing infarct in  the right cerebellar hemisphere that is new. Stable background of moderate chronic microvascular ischemic changes and mild parenchymal volume loss. No evidence of large acute infarct, hemorrhage, hydrocephalus, extra-axial collection, for focal mass effect. Vascular: No hyperdense vessel. Calcific atherosclerosis carotid siphons. Skull: Normal. Negative for fracture or focal lesion. Sinuses/Orbits: No acute finding. Other: None. CT CERVICAL SPINE FINDINGS Alignment: Normal cervical lordosis. Minimal stable C7-T1 anterolisthesis. Anterior cervical discectomy and fusion of C5 through C7. Hardware appears intact. Skull base and vertebrae: No acute fracture. No primary bone lesion or focal pathologic process. Soft tissues and spinal canal: No lymphadenopathy or discrete cervical mass identified on this noncontrast study. The aerodigestive tract is patent. Normal thyroid gland. Dense calcifications of the carotid bifurcations bilaterally. No prevertebral fluid or swelling. No visible canal hematoma.- Disc levels: Degenerative changes of the cervical spine mild multilevel facet arthropathy and discogenic changes with marginal osteophytes and disc space narrowing most pronounced at C7-T1. Disc bulges at the C3-4 and C4-5 levels. Upper chest: Scarring with calcifications in the lung apices. Other: None. IMPRESSION: 1. No skull fracture or intracranial hemorrhage identified. 2. Interval age-indeterminate lacunar infarcts in the basal ganglia bilaterally from 2016. 3. Interval chronic appearing small right occipital lobe infarct. 4. Stable background of moderate chronic  microvascular ischemic changes and mild parenchymal volume loss. 5. No acute fracture or dislocation of the cervical spine. 6. Multilevel cervical degenerative changes and intact anterior cervical discectomy and fusion hardware are stable. Electronically Signed   By: Mitzi Hansen M.D.   On: 01/26/2016 22:09   Ct Pelvis Wo Contrast  Result Date: 01/26/2016 CLINICAL DATA:  Unwitnessed fall 1.5 hours ago. Right-sided body pain. EXAM: CT PELVIS WITHOUT CONTRAST TECHNIQUE: Multidetector CT imaging of the pelvis was performed following the standard protocol without intravenous contrast. Bone algorithm images were obtained for evaluation of pelvis and hips. COMPARISON:  Right hip 01/26/2016. CT abdomen and pelvis 10/23/2015. FINDINGS: Urinary Tract: Technically limited evaluation of the urinary tract due to bone algorithm. Bladder is not abnormally distended and no wall thickening or filling defect noted. Distal ureters are dilated. Bowel: Technically limited evaluation due bone algorithm. Visualize pelvic portion of the colon and small bowel are not abnormally distended. Stool in the colon. Vascular/Lymphatic: Technically limited evaluation due to bone algorithm. No significant lymphadenopathy demonstrated. There is an aneurysm of the abdominal aorta, incompletely included within the field of view, visualized portion measuring 4 cm maximal AP diameter. Peripheral calcification. The aneurysm was present on the previous examination, measuring 4.8 cm maximal diameter. Comparison between the 2 studies is not possible because the entire aortic aneurysm is not included within current study. Reproductive: Technically limited due to bone algorithm. Uterus appears to be surgically absent. No focal pelvic mass lesions. Other: No evidence of significant muscular hematoma or infiltration. Musculoskeletal: There is a nondisplaced mildly comminuted fracture of the superior pubic ramus on the right. Nondisplaced fracture  of the left superior pubic ramus adjacent to the symphysis pubis. No evidence of pubic diastases. Fractures do not involve the symphysis pubis. Inferior pubic rami, acetabuli, pelvis, and hips are otherwise unremarkable. No additional fractures are demonstrated. SI joints are not displaced. Postoperative changes in the lower lumbar spine with lower lumbar laminectomies. IMPRESSION: Nondisplaced fractures of the right and left superior pubic rami anteriorly. No additional acute fractures demonstrated. Incidental note of a abdominal aortic aneurysm, incompletely included within the field of view. This has been present on previous studies. Comparison is not reliable due to incomplete inclusion of the aortic  aneurysm on today's study. Electronically Signed   By: Burman Nieves M.D.   On: 01/26/2016 21:59   Dg Hand Complete Right  Result Date: 01/26/2016 CLINICAL DATA:  Pain, contusion, and decreased range of motion of the right hand after a fall today. EXAM: RIGHT HAND - COMPLETE 3+ VIEW COMPARISON:  Right wrist 02/25/2015 FINDINGS: There is an acute transverse mildly comminuted fracture of the distal shaft of the right second finger with mild volar angulation. Small focal cortical irregularity and linear lucency in the distal shaft of the fourth metacarpal bone suggesting nondisplaced fracture no other acute fractures identified. Degenerative changes in the interphalangeal joints. Degenerative changes in the STT joints. Soft tissue swelling. IMPRESSION: Transverse mildly comminuted fracture of the distal shaft right second metacarpal bone. Nondisplaced fracture of the distal shaft right fourth metacarpal bone. Electronically Signed   By: Burman Nieves M.D.   On: 01/26/2016 21:40   Dg Hip Unilat W Or Wo Pelvis 2-3 Views Right  Result Date: 01/26/2016 CLINICAL DATA:  Pain following fall EXAM: DG HIP (WITH OR WITHOUT PELVIS) 2-3V RIGHT COMPARISON:  None. FINDINGS: Frontal pelvis as well as frontal and lateral  right hip images were obtained. There is a small avulsion arising from the dorsal aspect of the medial superior pubic ramus on the right. No other evidence of fracture. No dislocation. Bones are osteoporotic. There is mild symmetric narrowing of both hip joints. No erosive change. There is postoperative change at L5. There are foci of arterial vascular calcification in the common and external iliac arteries bilaterally. IMPRESSION: Small avulsion along the dorsal aspect of the medial portion of the right superior pubic ramus. No other evidence of fracture. No dislocation. Mild symmetric narrowing both hip joints. Bones appear osteoporotic. There is arterial vascular atherosclerosis. Electronically Signed   By: Bretta Bang III M.D.   On: 01/26/2016 21:43    Procedures Procedures (including critical care time)  Medications Ordered in ED Medications  acetaminophen (TYLENOL) tablet 1,000 mg (1,000 mg Oral Given 01/26/16 2138)    Initial Impression / Assessment and Plan / ED Course  I have reviewed the triage vital signs and the nursing notes.  Pertinent labs & imaging results that were available during my care of the patient were reviewed by me and considered in my medical decision making (see chart for details).  Clinical Course   CT head and cervical spine negative. CT pelvis revealed bilateral superior pubic rami fractures; minimally displaced. Plain film of the right hand revealed 3 metacarpal fractures. Patient provided with by mouth pain medicines.  Short arm splint placed. Spoke with Dr. Ophelia Charter of orthopedic surgery who will see the patient in the morning. We'll admit to hospitalist service for continued management as well as assessment for rehabilitation.  Final Clinical Impressions(s) / ED Diagnoses   Final diagnoses:  Fall  Multiple closed fractures of pelvis with stable disruption of pelvic circle, initial encounter (HCC)  Hand fracture, right, closed, initial encounter     Disposition: Admit  Condition: Stable  I personally performed the services described in this documentation, which was scribed in my presence. The recorded information has been reviewed and is accurate.         Nira Conn, MD 01/27/16 [phone]

## 2016-01-26 NOTE — ED Notes (Signed)
Patient transported to X-ray 

## 2016-01-26 NOTE — ED Triage Notes (Signed)
Pt turned and tripped over her foot and fell on to her right side.  Pt has a bruise on her right hand, pain in her right hip and buttock as well as head.  Pt reports that she hit her head but did not pass out.  Pt was able to crawl to door after fall to get help ([facility] [address]  Pt was ambulatory for ems to stretcher and again for us in ED from EMS to ED stretcher with help (she is ambulatory with walker at baseline).  Pt is alert and oriented

## 2016-01-26 NOTE — Progress Notes (Signed)
[facility].  Recent STEMI S/P CABG, also history of atrial fibrillation but she is not anticoagulated.  Per ED attending, sustained a mechanical fall at her independent living facility and now has evidence of pelvic and wrist fractures.  Ortho consulted (Dr. Ophelia CharterYates), and patient will be seen in the morning.  EKG requested.

## 2016-01-26 NOTE — ED Notes (Signed)
Pt was helped to the bathroom by EMT using wheelchair.

## 2016-01-26 NOTE — ED Notes (Signed)
Pt has increased swelling to right hand since arrival, puffy blue area on the back of her hand.  Pt declines ice pack which I applied on arrival as it feels too cold, hand elevated on linen

## 2016-01-27 ENCOUNTER — Encounter (HOSPITAL_COMMUNITY): Payer: Self-pay

## 2016-01-27 DIAGNOSIS — S32512A Fracture of superior rim of left pubis, initial encounter for closed fracture: Principal | ICD-10-CM

## 2016-01-27 DIAGNOSIS — I25119 Atherosclerotic heart disease of native coronary artery with unspecified angina pectoris: Secondary | ICD-10-CM

## 2016-01-27 DIAGNOSIS — S6291XA Unspecified fracture of right wrist and hand, initial encounter for closed fracture: Secondary | ICD-10-CM

## 2016-01-27 DIAGNOSIS — S32511A Fracture of superior rim of right pubis, initial encounter for closed fracture: Secondary | ICD-10-CM

## 2016-01-27 DIAGNOSIS — W19XXXA Unspecified fall, initial encounter: Secondary | ICD-10-CM

## 2016-01-27 DIAGNOSIS — S6290XA Unspecified fracture of unspecified wrist and hand, initial encounter for closed fracture: Secondary | ICD-10-CM

## 2016-01-27 LAB — BASIC METABOLIC PANEL
ANION GAP: 11 (ref 5–15)
BUN: 18 mg/dL (ref 6–20)
CHLORIDE: 95 mmol/L — AB (ref 101–111)
CO2: 26 mmol/L (ref 22–32)
CREATININE: 1.24 mg/dL — AB (ref 0.44–1.00)
Calcium: 9.7 mg/dL (ref 8.9–10.3)
GFR calc non Af Amer: 40 mL/min — ABNORMAL LOW (ref >60)
GFR, EST AFRICAN AMERICAN: 47 mL/min — AB (ref >60)
Glucose, Bld: 103 mg/dL — ABNORMAL HIGH (ref 65–99)
POTASSIUM: 4.3 mmol/L (ref 3.5–5.1)
SODIUM: 132 mmol/L — AB (ref 135–145)

## 2016-01-27 LAB — URINALYSIS, ROUTINE W REFLEX MICROSCOPIC
BILIRUBIN URINE: NEGATIVE
Glucose, UA: NEGATIVE mg/dL
HGB URINE DIPSTICK: NEGATIVE
KETONES UR: NEGATIVE mg/dL
NITRITE: POSITIVE — AB
Protein, ur: NEGATIVE mg/dL
SPECIFIC GRAVITY, URINE: 1.008 (ref 1.005–1.030)
pH: 6.5 (ref 5.0–8.0)

## 2016-01-27 LAB — URINE MICROSCOPIC-ADD ON

## 2016-01-27 MED ORDER — PHENAZOPYRIDINE HCL 200 MG PO TABS
200.0000 mg | ORAL_TABLET | Freq: Three times a day (TID) | ORAL | Status: DC | PRN
  Filled 2016-01-27 (×3): qty 1

## 2016-01-27 MED ORDER — ASPIRIN EC 325 MG PO TBEC
325.0000 mg | DELAYED_RELEASE_TABLET | Freq: Every day | ORAL | Status: DC
Start: 2016-01-27 — End: 2016-01-29
  Administered 2016-01-27 – 2016-01-29 (×3): 325 mg via ORAL
  Filled 2016-01-27 (×3): qty 1

## 2016-01-27 MED ORDER — PHENAZOPYRIDINE HCL 200 MG PO TABS
200.0000 mg | ORAL_TABLET | Freq: Three times a day (TID) | ORAL | Status: DC
  Administered 2016-01-27 (×2): 200 mg via ORAL
  Filled 2016-01-27 (×4): qty 1

## 2016-01-27 MED ORDER — POLYETHYLENE GLYCOL 3350 17 G PO PACK
17.0000 g | PACK | Freq: Every day | ORAL | Status: DC
Start: 2016-01-27 — End: 2016-01-29
  Administered 2016-01-27 – 2016-01-29 (×2): 17 g via ORAL
  Filled 2016-01-27 (×3): qty 1

## 2016-01-27 MED ORDER — MORPHINE SULFATE (PF) 2 MG/ML IV SOLN
0.5000 mg | INTRAVENOUS | Status: DC | PRN
Start: 2016-01-27 — End: 2016-01-29
  Administered 2016-01-27 – 2016-01-29 (×4): 0.5 mg via INTRAVENOUS
  Filled 2016-01-27 (×4): qty 1

## 2016-01-27 MED ORDER — OLANZAPINE 5 MG PO TABS
5.0000 mg | ORAL_TABLET | Freq: Every day | ORAL | Status: DC
  Administered 2016-01-27 – 2016-01-28 (×2): 5 mg via ORAL
  Filled 2016-01-27 (×3): qty 1

## 2016-01-27 MED ORDER — FENTANYL CITRATE (PF) 100 MCG/2ML IJ SOLN
50.0000 ug | Freq: Once | INTRAMUSCULAR | Status: AC
  Administered 2016-01-27: 50 ug via INTRAVENOUS
  Filled 2016-01-27: qty 2

## 2016-01-27 MED ORDER — HEPARIN SODIUM (PORCINE) 5000 UNIT/ML IJ SOLN
5000.0000 [IU] | Freq: Three times a day (TID) | INTRAMUSCULAR | Status: DC
  Administered 2016-01-27 – 2016-01-28 (×6): 5000 [IU] via SUBCUTANEOUS
  Filled 2016-01-27 (×7): qty 1

## 2016-01-27 MED ORDER — ATORVASTATIN CALCIUM 40 MG PO TABS
40.0000 mg | ORAL_TABLET | Freq: Every day | ORAL | Status: DC
  Administered 2016-01-27 – 2016-01-28 (×2): 40 mg via ORAL
  Filled 2016-01-27 (×2): qty 1

## 2016-01-27 MED ORDER — SENNOSIDES-DOCUSATE SODIUM 8.6-50 MG PO TABS
2.0000 | ORAL_TABLET | Freq: Two times a day (BID) | ORAL | Status: DC
Start: 2016-01-27 — End: 2016-01-29
  Administered 2016-01-27 – 2016-01-29 (×3): 2 via ORAL
  Filled 2016-01-27 (×6): qty 2

## 2016-01-27 MED ORDER — METOPROLOL TARTRATE 25 MG PO TABS
25.0000 mg | ORAL_TABLET | Freq: Two times a day (BID) | ORAL | Status: DC
  Administered 2016-01-27 – 2016-01-29 (×5): 25 mg via ORAL
  Filled 2016-01-27 (×5): qty 1

## 2016-01-27 MED ORDER — HYDROCODONE-ACETAMINOPHEN 5-325 MG PO TABS
1.0000 | ORAL_TABLET | Freq: Four times a day (QID) | ORAL | Status: DC | PRN
  Administered 2016-01-27 – 2016-01-29 (×5): 2 via ORAL
  Filled 2016-01-27 (×5): qty 2

## 2016-01-27 MED ORDER — SODIUM CHLORIDE 0.9 % IV SOLN
INTRAVENOUS | Status: DC
  Administered 2016-01-27 – 2016-01-28 (×3): via INTRAVENOUS

## 2016-01-27 MED ORDER — SERTRALINE HCL 50 MG PO TABS
50.0000 mg | ORAL_TABLET | Freq: Every morning | ORAL | Status: DC
  Administered 2016-01-27 – 2016-01-29 (×3): 50 mg via ORAL
  Filled 2016-01-27 (×3): qty 1

## 2016-01-27 MED ORDER — ACETAMINOPHEN 325 MG PO TABS: 325.0000 mg | ORAL_TABLET | ORAL | Status: DC | PRN

## 2016-01-27 NOTE — Progress Notes (Addendum)
H/o frequent falls, H/o CABG in 10/2015, h/o postop afib on asa 325, brought from independent living facility to ED due to fall on the right side, patient report it is a mechanical fall, denies any discomfort prior to the incident,  She is found to have bilaterl superior pubic rami fractures , minimally displaced and right had 3metacarpal fracture, ortho Dr Ophelia Charteryates will see patient. Patient report did not sleep well due to pain /discomfort. Cr elevated, sodium 132, from dehydration? Start gentle hydration, check ua , ( she denies urinary symptom) She report chronic memory deficit. Female friend in room. Will need snf

## 2016-01-27 NOTE — Care Management Note (Addendum)
Case Management Note  Patient Details  Name: Tonya Payne MRN: [id number] Date of Birth: 04/03/1935  Subjective/Objective: 81 yo F fell and sustained R 2nd, 3rd, and 4th MC fx's . Nondisplaced - no surgery needed. Superior rami fx R and L.                  Action/Plan: [facility]   Expected Discharge Date:                  Expected Discharge Plan:     In-House Referral:  Clinical Social Work  Discharge planning Services  CM Consult  Post Acute Care Choice:    Choice offered to:     DME Arranged:    DME Agency:     HH Arranged:    HH Agency:     Status of Service:  In process, will continue to follow  If discussed at Long Length of Stay Meetings, dates discussed:    Additional Comments: PT to evaluate pt. Will f/u with PT recommendations to assist with the D/C plan.  Isaias Cowmanliveras-Aizpurua, Jersee Winiarski, RN 01/27/2016, 12:17 PM

## 2016-01-27 NOTE — Progress Notes (Signed)
Patient received via [facility] and oriented to room. Tele monitoring placed and skin assessment completed. Paged [facility]. Patient repositioned for comfort, call light within reach.

## 2016-01-27 NOTE — Consult Note (Addendum)
Reason for Consult:fall with right metacarpal fx's and superior rami fx Referring Physician: F. Erlinda Hong MD  Tonya Payne is an 81 y.o. female.  HPI: 81 yo female with fall and above injuries. Uses a rolling walker with reverse seat for distance.   Past Medical History:  Diagnosis Date  . AAA (abdominal aortic aneurysm) ([place])   . Abdominal aneurysm ([place])    CTA 10/23/2015  . Anxiety   . Atrial fibrillation, new onset ([place]) 10/2015   after CABG, in SR at d/c  . CAD (coronary artery disease), native coronary artery 10/08/2015  . Congestive dilated cardiomyopathy ([place]) 10/08/2015  . COPD (chronic obstructive pulmonary disease) ([place])   . Depression   . Hyperlipidemia LDL goal <70 10/08/2015  . IBS (irritable bowel syndrome)   . Oral thrush   . Post PTCA   . TIA (transient ischemic attack)     Past Surgical History:  Procedure Laterality Date  . BACK SURGERY    . CARDIAC CATHETERIZATION N/A 10/06/2015   Procedure: Left Heart Cath and Coronary Angiography;  Surgeon: Leonie Man, MD;  Location: [place] [facility];  Service: Cardiovascular;  Laterality: N/A;  . CARDIAC CATHETERIZATION N/A 10/06/2015   Procedure: Coronary Balloon Angioplasty;  Surgeon: Leonie Man, MD;  Location: [place] [facility];  Service: Cardiovascular;  Laterality: N/A;  . COLECTOMY    . CORONARY ARTERY BYPASS GRAFT N/A 10/16/2015   Procedure: CORONARY ARTERY BYPASS GRAFTING (CABG)  x three, using left internal mammary artery and right leg greater saphenous vein harvested endoscopically;  Surgeon: Melrose Nakayama, MD;  Location: [place];  Service: Open Heart Surgery;  Laterality: N/A;  . NECK SURGERY    . TEE WITHOUT CARDIOVERSION N/A 10/16/2015   Procedure: TRANSESOPHAGEAL ECHOCARDIOGRAM (TEE);  Surgeon: Melrose Nakayama, MD;  Location: [place];  Service: Open Heart Surgery;  Laterality: N/A;    Family History  Problem Relation Age of Onset  . Stroke Mother     dead  . Clotting disorder Father     dead  bone  marrow disease    Social History:  reports that she quit smoking about 9 months ago. Her smoking use included Cigarettes. She smoked 0.50 packs per day. She has never used smokeless tobacco. She reports that she drinks about 1.2 oz of alcohol per week . She reports that she does not use drugs.  Allergies: No Known Allergies  Medications: I have reviewed the patient's current medications.  Results for orders placed or performed during the hospital encounter of 01/26/16 (from the past 48 hour(s))  CBC with Differential/Platelet     Status: Abnormal   Collection Time: 01/26/16 10:40 PM  Result Value Ref Range   WBC 11.1 (H) 4.0 - 10.5 K/uL   RBC 3.58 (L) 3.87 - 5.11 MIL/uL   Hemoglobin 10.8 (L) 12.0 - 15.0 g/dL   HCT 32.2 (L) 36.0 - 46.0 %   MCV 89.9 78.0 - 100.0 fL   MCH 30.2 26.0 - 34.0 pg   MCHC 33.5 30.0 - 36.0 g/dL   RDW 14.1 11.5 - 15.5 %   Platelets 300 150 - 400 K/uL   Neutrophils Relative % 75 %   Neutro Abs 8.4 (H) 1.7 - 7.7 K/uL   Lymphocytes Relative 12 %   Lymphs Abs 1.3 0.7 - 4.0 K/uL   Monocytes Relative 10 %   Monocytes Absolute 1.1 (H) 0.1 - 1.0 K/uL   Eosinophils Relative 3 %   Eosinophils Absolute 0.3 0.0 -  0.7 K/uL   Basophils Relative 0 %   Basophils Absolute 0.0 0.0 - 0.1 K/uL  Basic metabolic panel     Status: Abnormal   Collection Time: 01/26/16 10:40 PM  Result Value Ref Range   Sodium 129 (L) 135 - 145 mmol/L   Potassium 4.5 3.5 - 5.1 mmol/L   Chloride 97 (L) 101 - 111 mmol/L   CO2 23 22 - 32 mmol/L   Glucose, Bld 112 (H) 65 - 99 mg/dL   BUN 22 (H) 6 - 20 mg/dL   Creatinine, Ser 1.06 (H) 0.44 - 1.00 mg/dL   Calcium 9.2 8.9 - 10.3 mg/dL   GFR calc non Af Amer 49 (L) >60 mL/min   GFR calc Af Amer 56 (L) >60 mL/min    Comment: (NOTE) The eGFR has been calculated using the CKD EPI equation. This calculation has not been validated in all clinical situations. eGFR's persistently <60 mL/min signify possible Chronic Kidney Disease.    Anion gap 9 5 -  15  Basic metabolic panel     Status: Abnormal   Collection Time: 01/27/16  8:39 AM  Result Value Ref Range   Sodium 132 (L) 135 - 145 mmol/L   Potassium 4.3 3.5 - 5.1 mmol/L   Chloride 95 (L) 101 - 111 mmol/L   CO2 26 22 - 32 mmol/L   Glucose, Bld 103 (H) 65 - 99 mg/dL   BUN 18 6 - 20 mg/dL   Creatinine, Ser 1.24 (H) 0.44 - 1.00 mg/dL   Calcium 9.7 8.9 - 10.3 mg/dL   GFR calc non Af Amer 40 (L) >60 mL/min   GFR calc Af Amer 47 (L) >60 mL/min    Comment: (NOTE) The eGFR has been calculated using the CKD EPI equation. This calculation has not been validated in all clinical situations. eGFR's persistently <60 mL/min signify possible Chronic Kidney Disease.    Anion gap 11 5 - 15    Dg Elbow Complete Right (3+view)  Result Date: 01/26/2016 CLINICAL DATA:  Right elbow pain with laceration to the olecranon area after a fall today. EXAM: RIGHT ELBOW - COMPLETE 3+ VIEW COMPARISON:  None. FINDINGS: There is no evidence of fracture, dislocation, or joint effusion. There is no evidence of arthropathy or other focal bone abnormality. Soft tissues are unremarkable. IMPRESSION: Negative. Electronically Signed   By: Lucienne Capers M.D.   On: 01/26/2016 21:41   Dg Wrist Complete Right  Result Date: 01/26/2016 CLINICAL DATA:  Right wrist pain after a fall today. EXAM: RIGHT WRIST - COMPLETE 3+ VIEW COMPARISON:  02/25/2015 FINDINGS: Degenerative changes in the radiocarpal and STT joints. No fractures demonstrated in the carpal bones. Transverse fractures demonstrated in the distal right second and fourth metacarpal bones. Vague oblique linear lucency in the proximal second metacarpal bone could also represent nondisplaced fracture although this is not confirmed on all views. IMPRESSION: Degenerative changes in the right wrist. Fractures demonstrated in the right second and fourth metacarpal bones at the distal shaft. Possible nondisplaced fracture of the proximal second metacarpal bone. Carpal bones  appear intact. Electronically Signed   By: Lucienne Capers M.D.   On: 01/26/2016 21:47   Ct Head Wo Contrast  Result Date: 01/26/2016 CLINICAL DATA:  Status post fall to right side with head injury. EXAM: CT HEAD WITHOUT CONTRAST CT CERVICAL SPINE WITHOUT CONTRAST TECHNIQUE: Multidetector CT imaging of the head and cervical spine was performed following the standard protocol without intravenous contrast. Multiplanar CT image reconstructions of the cervical  spine were also generated. COMPARISON:  CT head and cervical spine 08/17/2014. FINDINGS: CT HEAD FINDINGS Brain: There has been interval development of lucencies within left lentiform nucleus and right posterior limb of internal capsule consistent with age-indeterminate lacunar infarcts. Additionally there is a chronic appearing infarct in the right cerebellar hemisphere that is new. Stable background of moderate chronic microvascular ischemic changes and mild parenchymal volume loss. No evidence of large acute infarct, hemorrhage, hydrocephalus, extra-axial collection, for focal mass effect. Vascular: No hyperdense vessel. Calcific atherosclerosis carotid siphons. Skull: Normal. Negative for fracture or focal lesion. Sinuses/Orbits: No acute finding. Other: None. CT CERVICAL SPINE FINDINGS Alignment: Normal cervical lordosis. Minimal stable C7-T1 anterolisthesis. Anterior cervical discectomy and fusion of C5 through C7. Hardware appears intact. Skull base and vertebrae: No acute fracture. No primary bone lesion or focal pathologic process. Soft tissues and spinal canal: No lymphadenopathy or discrete cervical mass identified on this noncontrast study. The aerodigestive tract is patent. Normal thyroid gland. Dense calcifications of the carotid bifurcations bilaterally. No prevertebral fluid or swelling. No visible canal hematoma.- Disc levels: Degenerative changes of the cervical spine mild multilevel facet arthropathy and discogenic changes with marginal  osteophytes and disc space narrowing most pronounced at C7-T1. Disc bulges at the C3-4 and C4-5 levels. Upper chest: Scarring with calcifications in the lung apices. Other: None. IMPRESSION: 1. No skull fracture or intracranial hemorrhage identified. 2. Interval age-indeterminate lacunar infarcts in the basal ganglia bilaterally from 2016. 3. Interval chronic appearing small right occipital lobe infarct. 4. Stable background of moderate chronic microvascular ischemic changes and mild parenchymal volume loss. 5. No acute fracture or dislocation of the cervical spine. 6. Multilevel cervical degenerative changes and intact anterior cervical discectomy and fusion hardware are stable. Electronically Signed   By: Kristine Garbe M.D.   On: 01/26/2016 22:09   Ct Cervical Spine Wo Contrast  Result Date: 01/26/2016 CLINICAL DATA:  Status post fall to right side with head injury. EXAM: CT HEAD WITHOUT CONTRAST CT CERVICAL SPINE WITHOUT CONTRAST TECHNIQUE: Multidetector CT imaging of the head and cervical spine was performed following the standard protocol without intravenous contrast. Multiplanar CT image reconstructions of the cervical spine were also generated. COMPARISON:  CT head and cervical spine 08/17/2014. FINDINGS: CT HEAD FINDINGS Brain: There has been interval development of lucencies within left lentiform nucleus and right posterior limb of internal capsule consistent with age-indeterminate lacunar infarcts. Additionally there is a chronic appearing infarct in the right cerebellar hemisphere that is new. Stable background of moderate chronic microvascular ischemic changes and mild parenchymal volume loss. No evidence of large acute infarct, hemorrhage, hydrocephalus, extra-axial collection, for focal mass effect. Vascular: No hyperdense vessel. Calcific atherosclerosis carotid siphons. Skull: Normal. Negative for fracture or focal lesion. Sinuses/Orbits: No acute finding. Other: None. CT CERVICAL  SPINE FINDINGS Alignment: Normal cervical lordosis. Minimal stable C7-T1 anterolisthesis. Anterior cervical discectomy and fusion of C5 through C7. Hardware appears intact. Skull base and vertebrae: No acute fracture. No primary bone lesion or focal pathologic process. Soft tissues and spinal canal: No lymphadenopathy or discrete cervical mass identified on this noncontrast study. The aerodigestive tract is patent. Normal thyroid gland. Dense calcifications of the carotid bifurcations bilaterally. No prevertebral fluid or swelling. No visible canal hematoma.- Disc levels: Degenerative changes of the cervical spine mild multilevel facet arthropathy and discogenic changes with marginal osteophytes and disc space narrowing most pronounced at C7-T1. Disc bulges at the C3-4 and C4-5 levels. Upper chest: Scarring with calcifications in the lung apices. Other: None.  IMPRESSION: 1. No skull fracture or intracranial hemorrhage identified. 2. Interval age-indeterminate lacunar infarcts in the basal ganglia bilaterally from 2016. 3. Interval chronic appearing small right occipital lobe infarct. 4. Stable background of moderate chronic microvascular ischemic changes and mild parenchymal volume loss. 5. No acute fracture or dislocation of the cervical spine. 6. Multilevel cervical degenerative changes and intact anterior cervical discectomy and fusion hardware are stable. Electronically Signed   By: Kristine Garbe M.D.   On: 01/26/2016 22:09   Ct Pelvis Wo Contrast  Result Date: 01/26/2016 CLINICAL DATA:  Unwitnessed fall 1.5 hours ago. Right-sided body pain. EXAM: CT PELVIS WITHOUT CONTRAST TECHNIQUE: Multidetector CT imaging of the pelvis was performed following the standard protocol without intravenous contrast. Bone algorithm images were obtained for evaluation of pelvis and hips. COMPARISON:  Right hip 01/26/2016. CT abdomen and pelvis 10/23/2015. FINDINGS: Urinary Tract: Technically limited evaluation of the  urinary tract due to bone algorithm. Bladder is not abnormally distended and no wall thickening or filling defect noted. Distal ureters are dilated. Bowel: Technically limited evaluation due bone algorithm. Visualize pelvic portion of the colon and small bowel are not abnormally distended. Stool in the colon. Vascular/Lymphatic: Technically limited evaluation due to bone algorithm. No significant lymphadenopathy demonstrated. There is an aneurysm of the abdominal aorta, incompletely included within the field of view, visualized portion measuring 4 cm maximal AP diameter. Peripheral calcification. The aneurysm was present on the previous examination, measuring 4.8 cm maximal diameter. Comparison between the 2 studies is not possible because the entire aortic aneurysm is not included within current study. Reproductive: Technically limited due to bone algorithm. Uterus appears to be surgically absent. No focal pelvic mass lesions. Other: No evidence of significant muscular hematoma or infiltration. Musculoskeletal: There is a nondisplaced mildly comminuted fracture of the superior pubic ramus on the right. Nondisplaced fracture of the left superior pubic ramus adjacent to the symphysis pubis. No evidence of pubic diastases. Fractures do not involve the symphysis pubis. Inferior pubic rami, acetabuli, pelvis, and hips are otherwise unremarkable. No additional fractures are demonstrated. SI joints are not displaced. Postoperative changes in the lower lumbar spine with lower lumbar laminectomies. IMPRESSION: Nondisplaced fractures of the right and left superior pubic rami anteriorly. No additional acute fractures demonstrated. Incidental note of a abdominal aortic aneurysm, incompletely included within the field of view. This has been present on previous studies. Comparison is not reliable due to incomplete inclusion of the aortic aneurysm on today's study. Electronically Signed   By: Lucienne Capers M.D.   On:  01/26/2016 21:59   Dg Hand Complete Right  Result Date: 01/26/2016 CLINICAL DATA:  Pain, contusion, and decreased range of motion of the right hand after a fall today. EXAM: RIGHT HAND - COMPLETE 3+ VIEW COMPARISON:  Right wrist 02/25/2015 FINDINGS: There is an acute transverse mildly comminuted fracture of the distal shaft of the right second finger with mild volar angulation. Small focal cortical irregularity and linear lucency in the distal shaft of the fourth metacarpal bone suggesting nondisplaced fracture no other acute fractures identified. Degenerative changes in the interphalangeal joints. Degenerative changes in the STT joints. Soft tissue swelling. IMPRESSION: Transverse mildly comminuted fracture of the distal shaft right second metacarpal bone. Nondisplaced fracture of the distal shaft right fourth metacarpal bone. Electronically Signed   By: Lucienne Capers M.D.   On: 01/26/2016 21:40   Dg Hip Unilat W Or Wo Pelvis 2-3 Views Right  Result Date: 01/26/2016 CLINICAL DATA:  Pain following fall EXAM: DG  HIP (WITH OR WITHOUT PELVIS) 2-3V RIGHT COMPARISON:  None. FINDINGS: Frontal pelvis as well as frontal and lateral right hip images were obtained. There is a small avulsion arising from the dorsal aspect of the medial superior pubic ramus on the right. No other evidence of fracture. No dislocation. Bones are osteoporotic. There is mild symmetric narrowing of both hip joints. No erosive change. There is postoperative change at L5. There are foci of arterial vascular calcification in the common and external iliac arteries bilaterally. IMPRESSION: Small avulsion along the dorsal aspect of the medial portion of the right superior pubic ramus. No other evidence of fracture. No dislocation. Mild symmetric narrowing both hip joints. Bones appear osteoporotic. There is arterial vascular atherosclerosis. Electronically Signed   By: Lowella Grip III M.D.   On: 01/26/2016 21:43    Review of Systems   Constitutional: Negative for chills and fever.  Cardiovascular:       CAD previous Cath   Genitourinary: Negative for flank pain.  Musculoskeletal: Positive for back pain.  Skin: Negative for rash.  Neurological:       Hx TIA   Blood pressure 137/65, pulse 71, temperature 97.5 F (36.4 C), temperature source Oral, resp. rate 18, height 5' 1"  (1.549 m), weight 46.2 kg (101 lb 13.6 oz), SpO2 94 %. Physical Exam  Constitutional: She appears well-developed.  HENT:  Head: Normocephalic.  Eyes: Pupils are equal, round, and reactive to light.  Neck: Normal range of motion.  Musculoskeletal:  Right hand splinted can move fingers.     Assessment/Plan: Right 2nd , 3rd and 4th MC fx's . Nondisplaced. ( no surgery needed. )  Superior rami Fx  right and left.     Plan  :  PT , platform walker.    My cell [phone] Marybelle Killings 01/27/2016, 10:46 AM

## 2016-01-27 NOTE — Progress Notes (Addendum)
Initial Nutrition Assessment   INTERVENTION:  Ensure Enlive po BID, each supplement provides 350 kcal and 20 grams of protein.  Pt prefers vanilla.  Heart Healthy diet    NUTRITION DIAGNOSIS:   Increased nutrient needs related to  acute injury as evidenced by estimated needs.   GOAL:   Patient will meet greater than or equal to 90% of their needs   MONITOR:   PO intake, Supplement acceptance, Labs, Weight trends  REASON FOR ASSESSMENT:   Consult Hip fracture protocol  ASSESSMENT: [facility] She presents following a mechanical fall resulting in fractures to pelvis and wrist. No surgery required. She has a hx of IBS, COPD, CAD, TIA and HLD. Her usual diet is regular with no texture or liquid modifications. She is independent with feeding and ambulates with a walker. She eats consistently during the day as provided by the facility and denies any changes in ability to chew / swallow or diminished appetite. She also drinks 1 ensure supplement daily.  Nutrition-Focused physical exam completed. Findings are mild to modetater fat,  muscle depletions that would be expected with a person her age and limited mobility. No edema. Ms Tonya Payne says she has been small for sometime now. Some fluctuations noted in wt hx--question if this may be outlier wt.     Recent Labs Lab 01/26/16 2240 01/27/16 0839  NA 129* 132*  K 4.5 4.3  CL 97* 95*  CO2 23 26  BUN 22* 18  CREATININE 1.06* 1.24*  CALCIUM 9.2 9.7  GLUCOSE 112* 103*   Labs: hyponatremia  Meds:Miralax, Senakot, Lipitor  Diet Order:  Diet Heart Room service appropriate? Yes; Fluid consistency: Thin  Skin:  Reviewed, no issues  Last BM:  9/22  Height:   Ht Readings from Last 1 Encounters:  01/27/16 5\' 1"  (1.549 m)    Weight:   Wt Readings from Last 1 Encounters:  01/27/16 101 lb 13.6 oz (46.2 kg)    Ideal Body Weight:  48 kg  BMI:  Body mass index is 19.24 kg/m.  Estimated Nutritional  Needs:   Kcal:  1380-1610   Protein:  55-60 gr  Fluid:  1200 ml daily  EDUCATION NEEDS:   Education needs addressed  Royann ShiversLynn Adelee Hannula MS,RD,CSG,LDN Office: [phone] Pager: [phone]

## 2016-01-27 NOTE — Progress Notes (Signed)
For orthostatic BP recorded only lying and sitting BP and pulse. Patient has bilateral hip fx from fall and not able to tolerate standing at this time.

## 2016-01-27 NOTE — H&P (Signed)
History and Physical    Tonya Payne ZOX:[id number] DOB: 12/21/1934 DOA: 01/26/2016   PCP: Rocky Morel, MD Chief Complaint:  Chief Complaint  Patient presents with  . Fall    HPI: Tonya Payne is a 81 y.o. female with medical history significant of CAD sp CABG in July of this year.  Patient presents to the ED at [facility] today following a mechanical fall.  She lost her balance and fell injuring her wrist and pelvis.  Unable to stand after fall.  Nothing makes pain better or worse.  Pain is severe, 8/10 currently.  Patient brought to ED by EMS.  ED Course: B suprapubic rami fractures and right wrist fracture.  Dr Ophelia Charter to see in AM.  Review of Systems: As per HPI otherwise 10 point review of systems negative.    Past Medical History:  Diagnosis Date  . AAA (abdominal aortic aneurysm) (HCC)   . Abdominal aneurysm (HCC)    CTA 10/23/2015  . Anxiety   . Atrial fibrillation, new onset (HCC) 10/2015   after CABG, in SR at d/c  . CAD (coronary artery disease), native coronary artery 10/08/2015  . Congestive dilated cardiomyopathy (HCC) 10/08/2015  . COPD (chronic obstructive pulmonary disease) (HCC)   . Depression   . Hyperlipidemia LDL goal <70 10/08/2015  . IBS (irritable bowel syndrome)   . Oral thrush   . Post PTCA   . TIA (transient ischemic attack)     Past Surgical History:  Procedure Laterality Date  . BACK SURGERY    . CARDIAC CATHETERIZATION N/A 10/06/2015   Procedure: Left Heart Cath and Coronary Angiography;  Surgeon: Marykay Lex, MD;  Location: [facility] INVASIVE CV LAB;  Service: Cardiovascular;  Laterality: N/A;  . CARDIAC CATHETERIZATION N/A 10/06/2015   Procedure: Coronary Balloon Angioplasty;  Surgeon: Marykay Lex, MD;  Location: [facility] INVASIVE CV LAB;  Service: Cardiovascular;  Laterality: N/A;  . COLECTOMY    . CORONARY ARTERY BYPASS GRAFT N/A 10/16/2015   Procedure: CORONARY ARTERY BYPASS GRAFTING (CABG)  x three, using left internal mammary artery and right leg  greater saphenous vein harvested endoscopically;  Surgeon: Loreli Slot, MD;  Location: [facility] OR;  Service: Open Heart Surgery;  Laterality: N/A;  . NECK SURGERY    . TEE WITHOUT CARDIOVERSION N/A 10/16/2015   Procedure: TRANSESOPHAGEAL ECHOCARDIOGRAM (TEE);  Surgeon: Loreli Slot, MD;  Location: [facility] OR;  Service: Open Heart Surgery;  Laterality: N/A;     reports that she quit smoking about 9 months ago. Her smoking use included Cigarettes. She smoked 0.50 packs per day. She has never used smokeless tobacco. She reports that she drinks about 1.2 oz of alcohol per week . She reports that she does not use drugs.  No Known Allergies  Family History  Problem Relation Age of Onset  . Stroke Mother     dead  . Clotting disorder Father     dead  bone marrow disease      Prior to Admission medications   Medication Sig Start Date End Date Taking? Authorizing Provider  acetaminophen (TYLENOL) 325 MG tablet Take by mouth every 4 (four) hours as needed for mild pain or moderate pain.    Historical Provider, MD  aspirin EC 325 MG EC tablet Take 1 tablet (325 mg total) by mouth daily. 10/30/15   Wayne E Gold, PA-C  atorvastatin (LIPITOR) 40 MG tablet Take 1 tablet (40 mg total) by mouth daily at 6 PM. 12/18/15   Rae Roam Barrett, PA-C  Fe Fum-Fe Poly-Vit C-Lactobac (FUSION PO) Take 1 tablet by mouth daily.    Historical Provider, MD  Melatonin 3 MG TABS Take 3 mg by mouth at bedtime.    Historical Provider, MD  metoprolol tartrate (LOPRESSOR) 25 MG tablet Take 1 tablet (25 mg total) by mouth 2 (two) times daily. 12/18/15   Erin R Barrett, PA-C  OLANZapine (ZYPREXA) 5 MG tablet Take 5 mg by mouth at bedtime.    Historical Provider, MD  phenazopyridine (PYRIDIUM) 200 MG tablet Take 200 mg by mouth 3 (three) times daily as needed for pain.    Historical Provider, MD  phenylephrine-shark liver oil-mineral oil-petrolatum (PREPARATION H) 0.25-3-14-71.9 % rectal ointment Place 1 application rectally  daily as needed for hemorrhoids.    Historical Provider, MD  polyethylene glycol (MIRALAX / GLYCOLAX) packet Take 17 g by mouth daily.    Historical Provider, MD  sennosides-docusate sodium (SENOKOT-S) 8.6-50 MG tablet Take 2 tablets by mouth 2 (two) times daily.    Historical Provider, MD  sertraline (ZOLOFT) 50 MG tablet Take 50 mg by mouth every morning.    Historical Provider, MD  traMADol (ULTRAM) 50 MG tablet Take 50 mg by mouth every 6 (six) hours as needed.    Historical Provider, MD  UNABLE TO FIND Med Name: MedPass 120 mL TID    Historical Provider, MD    Physical Exam: Vitals:   01/26/16 2255 01/27/16 0001 01/27/16 0038 01/27/16 0146  BP: 150/74 130/99 137/79 (!) 166/91  Pulse:  71 74 70  Resp:   17 16  Temp:    97.7 F (36.5 C)  TempSrc:    Oral  SpO2:  95% 95% 95%  Weight:    46.2 kg (101 lb 13.6 oz)  Height:    5\' 1"  (1.549 m)      Constitutional: NAD, calm, comfortable Eyes: PERRL, lids and conjunctivae normal ENMT: Mucous membranes are moist. Posterior pharynx clear of any exudate or lesions.Normal dentition.  Neck: normal, supple, no masses, no thyromegaly Respiratory: clear to auscultation bilaterally, no wheezing, no crackles. Normal respiratory effort. No accessory muscle use.  Cardiovascular: Regular rate and rhythm, no murmurs / rubs / gallops. No extremity edema. 2+ pedal pulses. No carotid bruits.  Abdomen: no tenderness, no masses palpated. No hepatosplenomegaly. Bowel sounds positive.  Musculoskeletal: no clubbing / cyanosis. No joint deformity upper and lower extremities. Good ROM, no contractures. Normal muscle tone.  Skin: no rashes, lesions, ulcers. No induration Neurologic: CN 2-12 grossly intact. Sensation intact, DTR normal. Strength 5/5 in all 4.  Psychiatric: Normal judgment and insight. Alert and oriented x 3. Normal mood.    Labs on Admission: I have personally reviewed following labs and imaging studies  CBC:  Recent Labs Lab  01/26/16 2240  WBC 11.1*  NEUTROABS 8.4*  HGB 10.8*  HCT 32.2*  MCV 89.9  PLT 300   Basic Metabolic Panel:  Recent Labs Lab 01/26/16 2240  NA 129*  K 4.5  CL 97*  CO2 23  GLUCOSE 112*  BUN 22*  CREATININE 1.06*  CALCIUM 9.2   GFR: Estimated Creatinine Clearance: 31.4 mL/min (by C-G formula based on SCr of 1.06 mg/dL (H)). Liver Function Tests: No results for input(s): AST, ALT, ALKPHOS, BILITOT, PROT, ALBUMIN in the last 168 hours. No results for input(s): LIPASE, AMYLASE in the last 168 hours. No results for input(s): AMMONIA in the last 168 hours. Coagulation Profile: No results for input(s): INR, PROTIME in the last 168 hours. Cardiac Enzymes: No results  for input(s): CKTOTAL, CKMB, CKMBINDEX, TROPONINI in the last 168 hours. BNP (last 3 results) No results for input(s): PROBNP in the last 8760 hours. HbA1C: No results for input(s): HGBA1C in the last 72 hours. CBG: No results for input(s): GLUCAP in the last 168 hours. Lipid Profile: No results for input(s): CHOL, HDL, LDLCALC, TRIG, CHOLHDL, LDLDIRECT in the last 72 hours. Thyroid Function Tests: No results for input(s): TSH, T4TOTAL, FREET4, T3FREE, THYROIDAB in the last 72 hours. Anemia Panel: No results for input(s): VITAMINB12, FOLATE, FERRITIN, TIBC, IRON, RETICCTPCT in the last 72 hours. Urine analysis:    Component Value Date/Time   COLORURINE ORANGE (A) 10/06/2015 1824   APPEARANCEUR CLEAR 10/06/2015 1824   LABSPEC 1.026 10/06/2015 1824   PHURINE 6.0 10/06/2015 1824   GLUCOSEU NEGATIVE 10/06/2015 1824   HGBUR NEGATIVE 10/06/2015 1824   BILIRUBINUR NEGATIVE 10/06/2015 1824   KETONESUR NEGATIVE 10/06/2015 1824   PROTEINUR NEGATIVE 10/06/2015 1824   UROBILINOGEN 0.2 02/25/2015 1826   NITRITE POSITIVE (A) 10/06/2015 1824   LEUKOCYTESUR SMALL (A) 10/06/2015 1824   Sepsis Labs: @LABRCNTIP (procalcitonin:4,lacticidven:4) )No results found for this or any previous visit (from the past 240 hour(s)).    Radiological Exams on Admission: Dg Elbow Complete Right (3+view)  Result Date: 01/26/2016 CLINICAL DATA:  Right elbow pain with laceration to the olecranon area after a fall today. EXAM: RIGHT ELBOW - COMPLETE 3+ VIEW COMPARISON:  None. FINDINGS: There is no evidence of fracture, dislocation, or joint effusion. There is no evidence of arthropathy or other focal bone abnormality. Soft tissues are unremarkable. IMPRESSION: Negative. Electronically Signed   By: Burman Nieves M.D.   On: 01/26/2016 21:41   Dg Wrist Complete Right  Result Date: 01/26/2016 CLINICAL DATA:  Right wrist pain after a fall today. EXAM: RIGHT WRIST - COMPLETE 3+ VIEW COMPARISON:  02/25/2015 FINDINGS: Degenerative changes in the radiocarpal and STT joints. No fractures demonstrated in the carpal bones. Transverse fractures demonstrated in the distal right second and fourth metacarpal bones. Vague oblique linear lucency in the proximal second metacarpal bone could also represent nondisplaced fracture although this is not confirmed on all views. IMPRESSION: Degenerative changes in the right wrist. Fractures demonstrated in the right second and fourth metacarpal bones at the distal shaft. Possible nondisplaced fracture of the proximal second metacarpal bone. Carpal bones appear intact. Electronically Signed   By: Burman Nieves M.D.   On: 01/26/2016 21:47   Ct Head Wo Contrast  Result Date: 01/26/2016 CLINICAL DATA:  Status post fall to right side with head injury. EXAM: CT HEAD WITHOUT CONTRAST CT CERVICAL SPINE WITHOUT CONTRAST TECHNIQUE: Multidetector CT imaging of the head and cervical spine was performed following the standard protocol without intravenous contrast. Multiplanar CT image reconstructions of the cervical spine were also generated. COMPARISON:  CT head and cervical spine 08/17/2014. FINDINGS: CT HEAD FINDINGS Brain: There has been interval development of lucencies within left lentiform nucleus and right  posterior limb of internal capsule consistent with age-indeterminate lacunar infarcts. Additionally there is a chronic appearing infarct in the right cerebellar hemisphere that is new. Stable background of moderate chronic microvascular ischemic changes and mild parenchymal volume loss. No evidence of large acute infarct, hemorrhage, hydrocephalus, extra-axial collection, for focal mass effect. Vascular: No hyperdense vessel. Calcific atherosclerosis carotid siphons. Skull: Normal. Negative for fracture or focal lesion. Sinuses/Orbits: No acute finding. Other: None. CT CERVICAL SPINE FINDINGS Alignment: Normal cervical lordosis. Minimal stable C7-T1 anterolisthesis. Anterior cervical discectomy and fusion of C5 through C7. Hardware appears intact.  Skull base and vertebrae: No acute fracture. No primary bone lesion or focal pathologic process. Soft tissues and spinal canal: No lymphadenopathy or discrete cervical mass identified on this noncontrast study. The aerodigestive tract is patent. Normal thyroid gland. Dense calcifications of the carotid bifurcations bilaterally. No prevertebral fluid or swelling. No visible canal hematoma.- Disc levels: Degenerative changes of the cervical spine mild multilevel facet arthropathy and discogenic changes with marginal osteophytes and disc space narrowing most pronounced at C7-T1. Disc bulges at the C3-4 and C4-5 levels. Upper chest: Scarring with calcifications in the lung apices. Other: None. IMPRESSION: 1. No skull fracture or intracranial hemorrhage identified. 2. Interval age-indeterminate lacunar infarcts in the basal ganglia bilaterally from 2016. 3. Interval chronic appearing small right occipital lobe infarct. 4. Stable background of moderate chronic microvascular ischemic changes and mild parenchymal volume loss. 5. No acute fracture or dislocation of the cervical spine. 6. Multilevel cervical degenerative changes and intact anterior cervical discectomy and fusion  hardware are stable. Electronically Signed   By: Mitzi Hansen M.D.   On: 01/26/2016 22:09   Ct Cervical Spine Wo Contrast  Result Date: 01/26/2016 CLINICAL DATA:  Status post fall to right side with head injury. EXAM: CT HEAD WITHOUT CONTRAST CT CERVICAL SPINE WITHOUT CONTRAST TECHNIQUE: Multidetector CT imaging of the head and cervical spine was performed following the standard protocol without intravenous contrast. Multiplanar CT image reconstructions of the cervical spine were also generated. COMPARISON:  CT head and cervical spine 08/17/2014. FINDINGS: CT HEAD FINDINGS Brain: There has been interval development of lucencies within left lentiform nucleus and right posterior limb of internal capsule consistent with age-indeterminate lacunar infarcts. Additionally there is a chronic appearing infarct in the right cerebellar hemisphere that is new. Stable background of moderate chronic microvascular ischemic changes and mild parenchymal volume loss. No evidence of large acute infarct, hemorrhage, hydrocephalus, extra-axial collection, for focal mass effect. Vascular: No hyperdense vessel. Calcific atherosclerosis carotid siphons. Skull: Normal. Negative for fracture or focal lesion. Sinuses/Orbits: No acute finding. Other: None. CT CERVICAL SPINE FINDINGS Alignment: Normal cervical lordosis. Minimal stable C7-T1 anterolisthesis. Anterior cervical discectomy and fusion of C5 through C7. Hardware appears intact. Skull base and vertebrae: No acute fracture. No primary bone lesion or focal pathologic process. Soft tissues and spinal canal: No lymphadenopathy or discrete cervical mass identified on this noncontrast study. The aerodigestive tract is patent. Normal thyroid gland. Dense calcifications of the carotid bifurcations bilaterally. No prevertebral fluid or swelling. No visible canal hematoma.- Disc levels: Degenerative changes of the cervical spine mild multilevel facet arthropathy and discogenic  changes with marginal osteophytes and disc space narrowing most pronounced at C7-T1. Disc bulges at the C3-4 and C4-5 levels. Upper chest: Scarring with calcifications in the lung apices. Other: None. IMPRESSION: 1. No skull fracture or intracranial hemorrhage identified. 2. Interval age-indeterminate lacunar infarcts in the basal ganglia bilaterally from 2016. 3. Interval chronic appearing small right occipital lobe infarct. 4. Stable background of moderate chronic microvascular ischemic changes and mild parenchymal volume loss. 5. No acute fracture or dislocation of the cervical spine. 6. Multilevel cervical degenerative changes and intact anterior cervical discectomy and fusion hardware are stable. Electronically Signed   By: Mitzi Hansen M.D.   On: 01/26/2016 22:09   Ct Pelvis Wo Contrast  Result Date: 01/26/2016 CLINICAL DATA:  Unwitnessed fall 1.5 hours ago. Right-sided body pain. EXAM: CT PELVIS WITHOUT CONTRAST TECHNIQUE: Multidetector CT imaging of the pelvis was performed following the standard protocol without intravenous contrast. Bone algorithm images  were obtained for evaluation of pelvis and hips. COMPARISON:  Right hip 01/26/2016. CT abdomen and pelvis 10/23/2015. FINDINGS: Urinary Tract: Technically limited evaluation of the urinary tract due to bone algorithm. Bladder is not abnormally distended and no wall thickening or filling defect noted. Distal ureters are dilated. Bowel: Technically limited evaluation due bone algorithm. Visualize pelvic portion of the colon and small bowel are not abnormally distended. Stool in the colon. Vascular/Lymphatic: Technically limited evaluation due to bone algorithm. No significant lymphadenopathy demonstrated. There is an aneurysm of the abdominal aorta, incompletely included within the field of view, visualized portion measuring 4 cm maximal AP diameter. Peripheral calcification. The aneurysm was present on the previous examination, measuring 4.8  cm maximal diameter. Comparison between the 2 studies is not possible because the entire aortic aneurysm is not included within current study. Reproductive: Technically limited due to bone algorithm. Uterus appears to be surgically absent. No focal pelvic mass lesions. Other: No evidence of significant muscular hematoma or infiltration. Musculoskeletal: There is a nondisplaced mildly comminuted fracture of the superior pubic ramus on the right. Nondisplaced fracture of the left superior pubic ramus adjacent to the symphysis pubis. No evidence of pubic diastases. Fractures do not involve the symphysis pubis. Inferior pubic rami, acetabuli, pelvis, and hips are otherwise unremarkable. No additional fractures are demonstrated. SI joints are not displaced. Postoperative changes in the lower lumbar spine with lower lumbar laminectomies. IMPRESSION: Nondisplaced fractures of the right and left superior pubic rami anteriorly. No additional acute fractures demonstrated. Incidental note of a abdominal aortic aneurysm, incompletely included within the field of view. This has been present on previous studies. Comparison is not reliable due to incomplete inclusion of the aortic aneurysm on today's study. Electronically Signed   By: Burman Nieves M.D.   On: 01/26/2016 21:59   Dg Hand Complete Right  Result Date: 01/26/2016 CLINICAL DATA:  Pain, contusion, and decreased range of motion of the right hand after a fall today. EXAM: RIGHT HAND - COMPLETE 3+ VIEW COMPARISON:  Right wrist 02/25/2015 FINDINGS: There is an acute transverse mildly comminuted fracture of the distal shaft of the right second finger with mild volar angulation. Small focal cortical irregularity and linear lucency in the distal shaft of the fourth metacarpal bone suggesting nondisplaced fracture no other acute fractures identified. Degenerative changes in the interphalangeal joints. Degenerative changes in the STT joints. Soft tissue swelling.  IMPRESSION: Transverse mildly comminuted fracture of the distal shaft right second metacarpal bone. Nondisplaced fracture of the distal shaft right fourth metacarpal bone. Electronically Signed   By: Burman Nieves M.D.   On: 01/26/2016 21:40   Dg Hip Unilat W Or Wo Pelvis 2-3 Views Right  Result Date: 01/26/2016 CLINICAL DATA:  Pain following fall EXAM: DG HIP (WITH OR WITHOUT PELVIS) 2-3V RIGHT COMPARISON:  None. FINDINGS: Frontal pelvis as well as frontal and lateral right hip images were obtained. There is a small avulsion arising from the dorsal aspect of the medial superior pubic ramus on the right. No other evidence of fracture. No dislocation. Bones are osteoporotic. There is mild symmetric narrowing of both hip joints. No erosive change. There is postoperative change at L5. There are foci of arterial vascular calcification in the common and external iliac arteries bilaterally. IMPRESSION: Small avulsion along the dorsal aspect of the medial portion of the right superior pubic ramus. No other evidence of fracture. No dislocation. Mild symmetric narrowing both hip joints. Bones appear osteoporotic. There is arterial vascular atherosclerosis. Electronically Signed  By: Bretta Bang III M.D.   On: 01/26/2016 21:43    EKG: Independently reviewed.  Assessment/Plan Principal Problem:   Pelvic fracture (HCC) Active Problems:   CAD (coronary artery disease), native coronary artery   S/P CABG x 3   AAA (abdominal aortic aneurysm) without rupture (HCC)    1. Pelvic fx and Wrist fx - 1. Cardiac diet for now (presumably not going to OR tomorrow for these fractures). 2. Probably would warrant cards eval and maybe a repeat echo pre-op if operative repair was planned since she is a mere 2 months out from a CABG. 3. Hip fx pathway for pain control 4. Bed rest currently until Dr. Ophelia Charter evals in AM 2. AAA - 5.3 cm AAA that needs EVAR once patient recovers from CABG, and now from this  event.   DVT prophylaxis: Heparin [place] for now Code Status: Full Family Communication: No family in room Consults called: Dr. Ophelia Charter called by EDP Admission status: Admit to inpatient   Hillary Bow DO [facility] Pager [phone] from 7PM-7AM  If 7AM-7PM, please contact the day physician for the patient [email or web address] Password [email or web address]  01/27/2016, 2:41 AM

## 2016-01-28 DIAGNOSIS — S32810A Multiple fractures of pelvis with stable disruption of pelvic ring, initial encounter for closed fracture: Secondary | ICD-10-CM

## 2016-01-28 DIAGNOSIS — I714 Abdominal aortic aneurysm, without rupture: Secondary | ICD-10-CM

## 2016-01-28 DIAGNOSIS — Z951 Presence of aortocoronary bypass graft: Secondary | ICD-10-CM

## 2016-01-28 LAB — CBC
HEMATOCRIT: 33.7 % — AB (ref 36.0–46.0)
Hemoglobin: 10.7 g/dL — ABNORMAL LOW (ref 12.0–15.0)
MCH: 29.1 pg (ref 26.0–34.0)
MCHC: 31.8 g/dL (ref 30.0–36.0)
MCV: 91.6 fL (ref 78.0–100.0)
Platelets: 304 10*3/uL (ref 150–400)
RBC: 3.68 MIL/uL — ABNORMAL LOW (ref 3.87–5.11)
RDW: 14.4 % (ref 11.5–15.5)
WBC: 7.6 10*3/uL (ref 4.0–10.5)

## 2016-01-28 LAB — BASIC METABOLIC PANEL
ANION GAP: 10 (ref 5–15)
BUN: 15 mg/dL (ref 6–20)
CALCIUM: 9.2 mg/dL (ref 8.9–10.3)
CO2: 21 mmol/L — AB (ref 22–32)
Chloride: 102 mmol/L (ref 101–111)
Creatinine, Ser: 1.13 mg/dL — ABNORMAL HIGH (ref 0.44–1.00)
GFR calc Af Amer: 52 mL/min — ABNORMAL LOW (ref >60)
GFR calc non Af Amer: 45 mL/min — ABNORMAL LOW (ref >60)
GLUCOSE: 93 mg/dL (ref 65–99)
POTASSIUM: 4.4 mmol/L (ref 3.5–5.1)
Sodium: 133 mmol/L — ABNORMAL LOW (ref 135–145)

## 2016-01-28 LAB — URINE CULTURE

## 2016-01-28 LAB — MAGNESIUM: Magnesium: 1.9 mg/dL (ref 1.7–2.4)

## 2016-01-28 MED ORDER — HYDROCODONE-ACETAMINOPHEN 5-325 MG PO TABS
1.0000 | ORAL_TABLET | Freq: Every day | ORAL | Status: DC
  Administered 2016-01-28: 1 via ORAL
  Filled 2016-01-28: qty 1

## 2016-01-28 MED ORDER — LORATADINE 10 MG PO TABS
10.0000 mg | ORAL_TABLET | Freq: Every day | ORAL | Status: DC
  Administered 2016-01-28 – 2016-01-29 (×2): 10 mg via ORAL
  Filled 2016-01-28 (×2): qty 1

## 2016-01-28 MED ORDER — INFLUENZA VAC SPLIT QUAD 0.5 ML IM SUSY
0.5000 mL | PREFILLED_SYRINGE | INTRAMUSCULAR | Status: AC
  Administered 2016-01-29: 0.5 mL via INTRAMUSCULAR
  Filled 2016-01-28: qty 0.5

## 2016-01-28 NOTE — Evaluation (Signed)
Occupational Therapy Evaluation Patient Details Name: Tonya Payne MRN: [id number] DOB: 1934-08-08 Today's Date: 01/28/2016    History of Present Illness 81 yo female from independent living ( [facility]) s/p fall with R 2nd 3rd and 4th MCP fx, non displaced rami Fx PMH:   Clinical Impression   PT admitted with s/p fall. Pt currently with functional limitiations due to the deficits listed below (see OT problem list). PTA was independent with all adls. Pt with recent admission 10/2015 and d/c at that time to [facility]. Pt reports disike for this facility.  Pt will benefit from skilled OT to increase their independence and safety with adls and balance to allow discharge SNF. Pt with decr in BP supine to sit and requires incr time to rebound with activity.      Follow Up Recommendations  SNF    Equipment Recommendations  3 in 1 bedside comode;Hospital bed;Other (comment) (R plateform walker )    Recommendations for Other Services       Precautions / Restrictions Precautions Precautions: Fall Precaution Comments: watch BP closely Restrictions Weight Bearing Restrictions: Yes RUE Weight Bearing: Weight bear through elbow only      Mobility Bed Mobility Overal bed mobility: Needs Assistance Bed Mobility: Rolling;Sidelying to Sit Rolling: Max assist Sidelying to sit: Max assist          Transfers Overall transfer level: Needs assistance Equipment used: Right platform walker Transfers: Sit to/from Stand Sit to Stand: +2 physical assistance;Min assist         General transfer comment: cues for hand placement and pushing up with L UE     Balance Overall balance assessment: Needs assistance                                          ADL Overall ADL's : Needs assistance/impaired Eating/Feeding: Minimal assistance;Sitting Eating/Feeding Details (indicate cue type and reason): pt will require L hand and she is R hand dominant Grooming: Wash/dry face;Min  Freight forwarder: +2 for physical assistance;Minimal assistance;Stand-pivot;BSC Toilet Transfer Details (indicate cue type and reason): pt required total +2 (A) for first attempt at transfer Toileting- Architect and Hygiene: Min guard;Sitting/lateral lean       Functional mobility during ADLs: Minimal assistance;Rolling walker General ADL Comments: pt progressing quickly with session but limited by BP and dizziness initially.      Vision     Perception     Praxis      Pertinent Vitals/Pain Pain Assessment: Faces Faces Pain Scale: Hurts even more Pain Location: R LE Pain Descriptors / Indicators: Grimacing Pain Intervention(s): Monitored during session;Repositioned;Limited activity within patient's tolerance     Hand Dominance Right   Extremity/Trunk Assessment Upper Extremity Assessment Upper Extremity Assessment: RUE deficits/detail RUE Deficits / Details: cast from MCP to forearm currently. pt able to wiggle all digits bruising noted   Lower Extremity Assessment Lower Extremity Assessment: Defer to PT evaluation   Cervical / Trunk Assessment Cervical / Trunk Assessment: Normal   Communication Communication Communication: No difficulties   Cognition Arousal/Alertness: Awake/alert Behavior During Therapy: WFL for tasks assessed/performed Overall Cognitive Status: Impaired/Different from baseline Area of Impairment: Orientation;Memory;Following commands;Safety/judgement;Awareness;Problem solving Orientation Level: Disoriented to;Place   Memory: Decreased short-term memory Following Commands: Follows multi-step commands inconsistently Safety/Judgement: Decreased awareness of safety;Decreased awareness  of deficits Awareness: Intellectual Problem Solving: Slow processing;Decreased initiation;Difficulty sequencing General Comments: pt asking repeated questions. pt introduced visitor as a Network engineerneighbor but then could not  initially recall his name. pt self corrected with time. pt demonstrates cognitive deficits   General Comments       Exercises       Shoulder Instructions      Home Living Family/patient expects to be discharged to:: Skilled nursing facility Living Arrangements: Alone Available Help at Discharge: Friend(s);Available PRN/intermittently Type of Home: Apartment Home Access: Level entry     Home Layout: One level               Home Equipment: Walker - 4 wheels;Cane - single point   Additional Comments: pt from [facility] independent [facility]      Prior Functioning/Environment Level of Independence: Needs assistance  Gait / Transfers Assistance Needed: pt reports she used rollator ADL's / Homemaking Assistance Needed: pt was able to bathe and dress herself   Comments: friends and neighbors drive her        OT Problem List: Decreased strength;Decreased range of motion;Decreased activity tolerance;Impaired balance (sitting and/or standing);Decreased safety awareness;Decreased knowledge of use of DME or AE;Decreased knowledge of precautions;Decreased cognition;Cardiopulmonary status limiting activity;Pain;Impaired UE functional use;Increased edema   OT Treatment/Interventions: Self-care/ADL training;Therapeutic exercise;Energy conservation;DME and/or AE instruction;Therapeutic activities;Cognitive remediation/compensation;Patient/family education;Balance training    OT Goals(Current goals can be found in the care plan section) Acute Rehab OT Goals Patient Stated Goal: to return home eventually OT Goal Formulation: With patient Time For Goal Achievement: 02/11/16 Potential to Achieve Goals: Good  OT Frequency: Min 2X/week   Barriers to D/C: Decreased caregiver support          Co-evaluation PT/OT/SLP Co-Evaluation/Treatment: Yes Reason for Co-Treatment: Complexity of the patient's impairments (multi-system involvement);For patient/therapist safety   OT goals addressed  during session: ADL's and self-care;Strengthening/ROM      End of Session Equipment Utilized During Treatment: Gait belt;Rolling walker Nurse Communication: Mobility status;Precautions  Activity Tolerance: Patient tolerated treatment well Patient left: in chair;with call bell/phone within reach;with chair alarm set   Time: 5427-06231050-1123 OT Time Calculation (min): 33 min Charges:  OT General Charges $OT Visit: 1 Procedure OT Evaluation $OT Eval Moderate Complexity: 1 Procedure G-Codes:    Boone MasterJones, Laurinda Carreno B 01/28/2016, 11:37 AM  Mateo FlowJones, Brynn   OTR/L Pager: [phone]: [phone] Office: [phone] .

## 2016-01-28 NOTE — Progress Notes (Signed)
PROGRESS NOTE  Tonya Payne ZOX:[id number] DOB: February 06, 1935 DOA: 01/26/2016 PCP: Rocky Morel, MD  HPI/Recap of past 24 hours:  Report did not sleep well due to pain last night Currently with mild pain  Assessment/Plan: Principal Problem:   Pelvic fracture (HCC) Active Problems:   CAD (coronary artery disease), native coronary artery   S/P CABG x 3   AAA (abdominal aortic aneurysm) without rupture (HCC)   Fall   Hand fracture  Pelvic and wrist fracture, nonoperative per ortho, pain control, PT/OT, need snf  Hyponatremia: improving on ivf, continue ivf, patient does look dehydrated.  H/o CABG in 10/2015, h/o post op afib on asa 325, statin, lopressor, no chest pain , no sob, NSR on tele  CKD III, cr seems at baseline, renal dosing meds  H/o frequent falls, on fall prevention, PT/OT, snf placement  Code Status: DNR  Family Communication: patient   Disposition Plan: SNF   Consultants:  ortho  Procedures:  none  Antibiotics:  none   Objective: BP 135/73   Pulse 76   Temp 98 F (36.7 C) (Oral)   Resp 18   Ht 5\' 1"  (1.549 m)   Wt 46.2 kg (101 lb 13.6 oz)   SpO2 95%   BMI 19.24 kg/m   Intake/Output Summary (Last 24 hours) at 01/28/16 1152 Last data filed at 01/28/16 0143  Gross per 24 hour  Intake          1528.75 ml  Output              325 ml  Net          1203.75 ml   Filed Weights   01/26/16 2010 01/27/16 0146  Weight: 45.4 kg (100 lb) 46.2 kg (101 lb 13.6 oz)    Exam:   General:  NAD, frail  Cardiovascular: RRR  Respiratory: CTABL  Abdomen: Soft/ND/NT, positive BS  Musculoskeletal: No Edema, right wrist in cast  Neuro: aaix3, does has baseline impaired memory  Data Reviewed: Basic Metabolic Panel:  Recent Labs Lab 01/26/16 2240 01/27/16 0839 01/28/16 0235  NA 129* 132* 133*  K 4.5 4.3 4.4  CL 97* 95* 102  CO2 23 26 21*  GLUCOSE 112* 103* 93  BUN 22* 18 15  CREATININE 1.06* 1.24* 1.13*  CALCIUM 9.2 9.7 9.2  MG   --   --  1.9   Liver Function Tests: No results for input(s): AST, ALT, ALKPHOS, BILITOT, PROT, ALBUMIN in the last 168 hours. No results for input(s): LIPASE, AMYLASE in the last 168 hours. No results for input(s): AMMONIA in the last 168 hours. CBC:  Recent Labs Lab 01/26/16 2240 01/28/16 0235  WBC 11.1* 7.6  NEUTROABS 8.4*  --   HGB 10.8* 10.7*  HCT 32.2* 33.7*  MCV 89.9 91.6  PLT 300 304   Cardiac Enzymes:   No results for input(s): CKTOTAL, CKMB, CKMBINDEX, TROPONINI in the last 168 hours. BNP (last 3 results) No results for input(s): BNP in the last 8760 hours.  ProBNP (last 3 results) No results for input(s): PROBNP in the last 8760 hours.  CBG: No results for input(s): GLUCAP in the last 168 hours.  Recent Results (from the past 240 hour(s))  Urine culture     Status: Abnormal   Collection Time: 01/27/16  1:09 PM  Result Value Ref Range Status   Specimen Description URINE, RANDOM  Final   Special Requests NONE  Final   Culture MULTIPLE SPECIES PRESENT, SUGGEST RECOLLECTION (A)  Final  Report Status 01/28/2016 FINAL  Final     Studies: No results found.  Scheduled Meds: . aspirin  325 mg Oral Daily  . atorvastatin  40 mg Oral q1800  . heparin  5,000 Units Subcutaneous Q8H  . HYDROcodone-acetaminophen  1 tablet Oral QHS  . metoprolol tartrate  25 mg Oral BID  . OLANZapine  5 mg Oral QHS  . polyethylene glycol  17 g Oral Daily  . senna-docusate  2 tablet Oral BID  . sertraline  50 mg Oral q morning - 10a    Continuous Infusions: . sodium chloride 75 mL/hr at 01/28/16 0144     Time spent: 25mins  Latrecia Capito MD, PhD  [facility] Pager [phone]. If 7PM-7AM, please contact night-coverage at [email or web address], password [email or web address] 01/28/2016, 11:52 AM  LOS: 2 days

## 2016-01-28 NOTE — Evaluation (Signed)
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 Pt needs skilled PT to maximize independence and safety to allow discharge to ST-SNF. Expect will make steady progress and be able to eventually return to apt.     Follow Up Recommendations SNF    Equipment Recommendations  Other (comment);Rolling walker with 5" wheels (with rt platform)    Recommendations for Other Services       Precautions / Restrictions Precautions Precautions: Fall Precaution Comments: watch BP closely Restrictions Weight Bearing Restrictions: Yes RUE Weight Bearing: Weight bear through elbow only RLE Weight Bearing: Weight bearing as tolerated LLE Weight Bearing: Weight bearing as tolerated      Mobility  Bed Mobility Overal bed mobility: Needs Assistance Bed Mobility: Rolling;Sidelying to Sit Rolling: Max assist Sidelying to sit: Max assist       General bed mobility comments: Assist to elevate trunk into sitting and bring hips to EOB  Transfers Overall transfer level: Needs assistance Equipment used: Right platform walker Transfers: Sit to/from Stand;Stand Pivot Transfers Sit to Stand: +2 physical assistance;Min assist Stand pivot transfers: +2 physical assistance;Min assist       General transfer comment: cues for hand placement and pushing up with L UE. Assist to bring hips up. Bed to bsc with bil hand held assist and assist to pivot hips.  Ambulation/Gait Ambulation/Gait assistance: Min assist;+2 safety/equipment Ambulation Distance (Feet): 12 Feet Assistive device: Rolling walker (2  wheeled);Right platform walker Gait Pattern/deviations: Step-to pattern;Decreased step length - right;Decreased step length - left;Decreased stance time - right;Decreased stance time - left;Antalgic Gait velocity: decr Gait velocity interpretation: Below normal speed for age/gender General Gait Details: Assist for support. Encouragement due to pain.  Stairs            Wheelchair Mobility    Modified Rankin (Stroke Patients Only)       Balance Overall balance assessment: Needs assistance Sitting-balance support: No upper extremity supported;Feet supported Sitting balance-[name]: Good     Standing balance support: Single extremity supported Standing balance-[name]: Poor Standing balance comment: UE support and min A for static standing                             Pertinent Vitals/Pain Pain Assessment: Faces Faces Pain Scale: Hurts even more Pain Location: RLE Pain Descriptors / Indicators: Grimacing Pain Intervention(s): Monitored during session;Limited activity within patient's tolerance;Repositioned    Home Living Family/patient expects to be discharged to:: Skilled nursing facility Living Arrangements: Alone Available Help at Discharge: Friend(s);Available PRN/intermittently Type of Home: Apartment Home Access: Level entry     Home Layout: One level Home Equipment: Walker - 4 wheels;Cane - single point Additional Comments: pt from [facility]    Prior Function Level of Independence: Needs assistance   Gait / Transfers Assistance Needed: pt reports she used rollator  ADL's / Homemaking Assistance Needed: pt was able to bathe and dress herself  Comments: friends and neighbors drive her     Hand Dominance   Dominant Hand: Right    Extremity/Trunk Assessment   Upper Extremity Assessment:  Defer to OT evaluation RUE Deficits / Details: cast from MCP to forearm currently. pt able to wiggle all digits bruising noted          Lower Extremity Assessment: RLE deficits/detail;LLE deficits/detail RLE Deficits / Details: Limited due to pain from pelvic fx LLE Deficits / Details: Limited due to pain from pelvic fx  Cervical / Trunk Assessment: Normal  Communication   Communication: No difficulties  Cognition Arousal/Alertness: Awake/alert Behavior During Therapy: WFL for tasks assessed/performed Overall Cognitive Status: Impaired/Different from baseline Area of Impairment: Orientation;Memory;Following commands;Safety/judgement;Awareness;Problem solving Orientation Level: Disoriented to;Place   Memory: Decreased short-term memory Following Commands: Follows multi-step commands inconsistently Safety/Judgement: Decreased awareness of safety;Decreased awareness of deficits Awareness: Intellectual Problem Solving: Slow processing;Decreased initiation;Difficulty sequencing General Comments: pt asking repeated questions. pt introduced visitor as a Network engineerneighbor but then could not initially recall his name. pt self corrected with time. pt demonstrates cognitive deficits    General Comments General comments (skin integrity, edema, etc.): edema and bruising R UE. Ice applied this session.     Exercises     Assessment/Plan    PT Assessment Patient needs continued PT services  PT Problem List Decreased strength;Decreased activity tolerance;Decreased balance;Decreased mobility;Decreased cognition;Decreased knowledge of use of DME;Decreased knowledge of precautions;Pain          PT Treatment Interventions DME instruction;Gait training;Functional mobility training;Therapeutic activities;Therapeutic exercise;Balance training;Patient/family education    PT Goals (Current goals can be found in the Care Plan section)  Acute Rehab PT Goals Patient Stated Goal: to return home eventually PT Goal Formulation: With patient Time For Goal Achievement: 02/11/16 Potential to Achieve Goals: Good    Frequency Min 3X/week    Barriers to discharge Decreased caregiver support Lives alone    Co-evaluation PT/OT/SLP Co-Evaluation/Treatment: Yes Reason for Co-Treatment: Complexity of the patient's impairments (multi-system involvement);For patient/therapist safety PT goals addressed during session: Mobility/safety with mobility;Proper use of DME OT goals addressed during session: ADL's and self-care;Strengthening/ROM       End of Session Equipment Utilized During Treatment: Gait belt Activity Tolerance: Patient tolerated treatment well Patient left: in chair;with call bell/phone within reach;with chair alarm set Nurse Communication: Mobility status         Time: [id number] PT Time Calculation (min) (ACUTE ONLY): 22 min   Charges:   PT Evaluation $PT Eval Moderate Complexity: 1 Procedure     PT G Codes:        Christapher Gillian 01/28/2016, 12:00 PM Arlin Savona PT [phone]

## 2016-01-28 NOTE — Progress Notes (Signed)
Subjective:    Patient reports pain as  Pain mild except with ambulation then it is severe .   Objective: Vital signs in last 24 hours: Temp:  [97.8 F (36.6 C)-98.7 F (37.1 C)] 98 F (36.7 C) (09/24 0431) Pulse Rate:  [65-78] 76 (09/24 1005) Resp:  [18-20] 18 (09/24 0431) BP: (135-160)/(52-83) 135/73 (09/24 1005) SpO2:  [93 %-95 %] 95 % (09/24 0431)  Intake/Output from previous day: 09/23 0701 - 09/24 0700 In: 1768.8 [P.O.:720; I.V.:1048.8] Out: 325 [Urine:325] Intake/Output this shift: No intake/output data recorded.   Recent Labs  01/26/16 2240 01/28/16 0235  HGB 10.8* 10.7*    Recent Labs  01/26/16 2240 01/28/16 0235  WBC 11.1* 7.6  RBC 3.58* 3.68*  HCT 32.2* 33.7*  PLT 300 304    Recent Labs  01/27/16 0839 01/28/16 0235  NA 132* 133*  K 4.3 4.4  CL 95* 102  CO2 26 21*  BUN 18 15  CREATININE 1.24* 1.13*  GLUCOSE 103* 93  CALCIUM 9.7 9.2   No results for input(s): LABPT, INR in the last 72 hours.  Neurologically intact  Assessment/Plan:    Up with therapy likely SNF  Eldred MangesMark C Zaiden Ludlum 01/28/2016, 11:17 AM

## 2016-01-29 DIAGNOSIS — E871 Hypo-osmolality and hyponatremia: Secondary | ICD-10-CM

## 2016-01-29 LAB — CBC
HEMATOCRIT: 34.5 % — AB (ref 36.0–46.0)
HEMOGLOBIN: 10.9 g/dL — AB (ref 12.0–15.0)
MCH: 28.9 pg (ref 26.0–34.0)
MCHC: 31.6 g/dL (ref 30.0–36.0)
MCV: 91.5 fL (ref 78.0–100.0)
Platelets: 300 10*3/uL (ref 150–400)
RBC: 3.77 MIL/uL — AB (ref 3.87–5.11)
RDW: 14.3 % (ref 11.5–15.5)
WBC: 7.9 10*3/uL (ref 4.0–10.5)

## 2016-01-29 LAB — BASIC METABOLIC PANEL
Anion gap: 10 (ref 5–15)
BUN: 13 mg/dL (ref 6–20)
CHLORIDE: 104 mmol/L (ref 101–111)
CO2: 20 mmol/L — ABNORMAL LOW (ref 22–32)
Calcium: 9.2 mg/dL (ref 8.9–10.3)
Creatinine, Ser: 0.9 mg/dL (ref 0.44–1.00)
GFR calc Af Amer: >60 mL/min (ref >60)
GFR calc non Af Amer: 59 mL/min — ABNORMAL LOW (ref >60)
Glucose, Bld: 109 mg/dL — ABNORMAL HIGH (ref 65–99)
POTASSIUM: 4.1 mmol/L (ref 3.5–5.1)
SODIUM: 134 mmol/L — AB (ref 135–145)

## 2016-01-29 MED ORDER — HYDROCODONE-ACETAMINOPHEN 5-325 MG PO TABS: 1.0000 | ORAL_TABLET | Freq: Every day | ORAL | 0 refills | Status: DC

## 2016-01-29 MED ORDER — HYDROCODONE-ACETAMINOPHEN 5-325 MG PO TABS: 1.0000 | ORAL_TABLET | Freq: Four times a day (QID) | ORAL | 0 refills | Status: DC | PRN

## 2016-01-29 MED ORDER — POTASSIUM CHLORIDE CRYS ER 10 MEQ PO TBCR: 10.0000 meq | EXTENDED_RELEASE_TABLET | ORAL | 0 refills | Status: DC

## 2016-01-29 MED ORDER — ALPRAZOLAM 0.25 MG PO TABS: 0.2500 mg | ORAL_TABLET | Freq: Two times a day (BID) | ORAL | 0 refills | Status: DC | PRN

## 2016-01-29 NOTE — NC FL2 (Signed)
[place] MEDICAID FL2 LEVEL OF CARE SCREENING TOOL     IDENTIFICATION  Patient Name: Tonya Payne Birthdate: November 12, 1934 Sex: female Admission Date (Current Location): 01/26/2016  [facility] and [place] Number:  Producer, television/film/video and Address:  [facility], [address] [place], [place] [address]      Provider Number: [id number]  Attending Physician Name and Address:  Albertine Grates, MD  Relative Name and Phone Number:       Current Level of Care: Hospital Recommended Level of Care: Skilled Nursing Facility Prior Approval Number:    Date Approved/Denied:   PASRR Number: [id number]  Discharge Plan: SNF    Current Diagnoses: Patient Active Problem List   Diagnosis Date Noted  . Fall   . Hand fracture   . Pelvic fracture (HCC) 01/26/2016  . AAA (abdominal aortic aneurysm) without rupture (HCC) 11/30/2015  . Post PTCA   . S/P CABG x 3 10/16/2015  . CAD (coronary artery disease), native coronary artery 10/08/2015  . Congestive dilated cardiomyopathy (HCC) 10/08/2015  . Hyperlipidemia LDL goal <70 10/08/2015  . UTI (urinary tract infection) 10/06/2015  . ST elevation (STEMI) myocardial infarction involving left anterior descending coronary artery (HCC) 10/06/2015    Orientation RESPIRATION BLADDER Height & Weight     Self, Time, Situation, Place  Normal Continent Weight: 101 lb 13.6 oz (46.2 kg) Height:  5\' 1"  (154.9 cm)  BEHAVIORAL SYMPTOMS/MOOD NEUROLOGICAL BOWEL NUTRITION STATUS   (none)  (none) Continent    AMBULATORY STATUS COMMUNICATION OF NEEDS Skin   Limited Assist Verbally Normal                       Personal Care Assistance Level of Assistance  Bathing, Feeding, Dressing Bathing Assistance: Limited assistance Feeding assistance: Limited assistance Dressing Assistance: Limited assistance     Functional Limitations Info  Sight, Hearing, Speech Sight Info: Adequate Hearing Info: Adequate Speech Info: Adequate    SPECIAL  CARE FACTORS FREQUENCY  PT (By licensed PT), OT (By licensed OT)                    Contractures Contractures Info: Not present    Additional Factors Info  Code Status, Allergies, Psychotropic Code Status Info: DNR Allergies Info: NKDA Psychotropic Info: sertraline (ZOLOFT) tablet 50 mg Dose: 50 mg Freq:  Every morning - 10a Route: PO, OLANZapine (ZYPREXA) tablet 5 mg Dose: 5 mg Freq: Daily at bedtime Route: PO         Current Medications (01/29/2016):  This is the current hospital active medication list Current Facility-Administered Medications  Medication Dose Route Frequency Provider Last Rate Last Dose  . 0.9 %  sodium chloride infusion   Intravenous Continuous Albertine Grates, MD 75 mL/hr at 01/28/16 1726    . acetaminophen (TYLENOL) tablet 325 mg  325 mg Oral Q4H PRN Hillary Bow, DO      . aspirin EC tablet 325 mg  325 mg Oral Daily Hillary Bow, DO   325 mg at 01/29/16 1000  . atorvastatin (LIPITOR) tablet 40 mg  40 mg Oral q1800 Hillary Bow, DO   40 mg at 01/28/16 1744  . heparin injection 5,000 Units  5,000 Units Subcutaneous Q8H Hillary Bow, DO   5,000 Units at 01/28/16 2108  . HYDROcodone-acetaminophen (NORCO/VICODIN) 5-325 MG per tablet 1 tablet  1 tablet Oral QHS Albertine Grates, MD   1 tablet at 01/28/16 2108  . HYDROcodone-acetaminophen (NORCO/VICODIN) 5-325  MG per tablet 1-2 tablet  1-2 tablet Oral Q6H PRN Hillary BowJared M Gardner, DO   2 tablet at 01/29/16 [phone]  . loratadine (CLARITIN) tablet 10 mg  10 mg Oral Daily Albertine GratesFang Samon Dishner, MD   10 mg at 01/29/16 1000  . metoprolol tartrate (LOPRESSOR) tablet 25 mg  25 mg Oral BID Hillary BowJared M Gardner, DO   25 mg at 01/29/16 0956  . morphine 2 MG/ML injection 0.5 mg  0.5 mg Intravenous Q2H PRN Hillary BowJared M Gardner, DO   0.5 mg at 01/29/16 0540  . OLANZapine (ZYPREXA) tablet 5 mg  5 mg Oral QHS Hillary BowJared M Gardner, DO   5 mg at 01/28/16 2108  . phenazopyridine (PYRIDIUM) tablet 200 mg  200 mg Oral TID WC PRN Albertine GratesFang Pearle Wandler, MD      . polyethylene glycol  (MIRALAX / GLYCOLAX) packet 17 g  17 g Oral Daily Hillary BowJared M Gardner, DO   17 g at 01/29/16 1000  . senna-docusate (Senokot-S) tablet 2 tablet  2 tablet Oral BID Hillary BowJared M Gardner, DO   2 tablet at 01/29/16 1000  . sertraline (ZOLOFT) tablet 50 mg  50 mg Oral q morning - 10a Hillary BowJared M Gardner, DO   50 mg at 01/29/16 1000   Facility-Administered Medications Ordered in Other Encounters  Medication Dose Route Frequency Provider Last Rate Last Dose  . bivalirudin (ANGIOMAX) 250 mg in sodium chloride 0.9 % 50 mL (5 mg/mL) infusion  250 mg  Continuous PRN Marykay Lexavid W Harding, MD   Stopped at 10/06/15 1556  . bivalirudin (ANGIOMAX) BOLUS via infusion    PRN Marykay Lexavid W Harding, MD   27.225 mg at 10/06/15 1504  . fentaNYL (SUBLIMAZE) injection    PRN Marykay Lexavid W Harding, MD   25 mcg at 10/06/15 1529  . heparin infusion 2 units/mL in 0.9 % sodium chloride    Continuous PRN Marykay Lexavid W Harding, MD   1,500 mL at 10/06/15 1454  . iopamidol (ISOVUE-370) 76 % injection    PRN Marykay Lexavid W Harding, MD   150 mL at 10/06/15 1554  . labetalol (NORMODYNE,TRANDATE) injection    PRN Marykay Lexavid W Harding, MD   10 mg at 10/06/15 1526  . lidocaine (PF) (XYLOCAINE) 1 % injection    PRN Marykay Lexavid W Harding, MD   2 mL at 10/06/15 1454  . nitroGLYCERIN 1 mg/10 ml (100 mcg/ml) - IR/CATH LAB    PRN Marykay Lexavid W Harding, MD   200 mcg at 10/06/15 1525  . ondansetron (ZOFRAN) injection    PRN Marykay Lexavid W Harding, MD   4 mg at 10/06/15 1453  . Radial Cocktail (Verapamil 2.5 mg, NTG, Lidocaine)    PRN Marykay Lexavid W Harding, MD      . ticagrelor [facility](BRILINTA) tablet    PRN Marykay Lexavid W Harding, MD   180 mg at 10/06/15 1506  . tirofiban (AGGRASTAT) bolus via infusion    PRN Marykay Lexavid W Harding, MD   907.5 mcg at 10/06/15 1558  . tirofiban (AGGRASTAT) infusion 50 mcg/mL 100 mL    Continuous PRN Marykay Lexavid W Harding, MD 3.3 mL/hr at 10/06/15 1606 0.075 mcg/kg/min at 10/06/15 1606     Discharge Medications: Please see discharge summary for a list of discharge medications.  Relevant Imaging  Results:  Relevant Lab Results:   Additional Information SSN:[id number]  Reggy EyeLaShonda A Aycock, LCSW

## 2016-01-29 NOTE — Care Management Note (Signed)
Case Management Note Previous CM note initiated by Isaias Cowmanliveras-Aizpurua, Jeannette, RN- 01/27/2016, 12:17 PM   Patient Details  Name: Tonya Payne MRN: [id number] Date of Birth: 08/28/1934  Subjective/Objective: 81 yo F fell and sustained R 2nd, 3rd, and 4th MC fx's . Nondisplaced - no surgery needed. Superior rami fx R and L.                  Action/Plan: [facility]   Expected Discharge Date:    01/29/16              Expected Discharge Plan:  Skilled Nursing Facility  In-House Referral:  Clinical Social Work  Discharge planning Services  CM Consult  Post Acute Care Choice:    Choice offered to:     DME Arranged:    DME Agency:     HH Arranged:    HH Agency:     Status of Service:  Completed, signed off  If discussed at [employer] Meetings, dates discussed:    Discharge Disposition: skilled facility   Additional Comments: PT to evaluate pt. Will f/u with PT recommendations to assist with the D/C plan.  01/29/16- 1415- Tonya Roorda RN, CM- pt for d/c  Today to SNF- CSW following for placement needs.  Tonya Payne, DallasKristi Hall, RN 01/29/2016, 2:14 PM [phone]

## 2016-01-29 NOTE — Care Management Important Message (Signed)
Important Message  Patient Details  Name: Tonya AlmSuann Payne MRN: [id number] Date of Birth: 27-Jul-1934   Medicare Important Message Given:  Yes    [name] 01/29/2016, 11:00 AM

## 2016-01-29 NOTE — Discharge Summary (Signed)
Discharge Summary  Tonya Payne ZOX:[id number] DOB: [birth year]  PCP: Rocky Morel, MD  Admit date: 01/26/2016 Discharge date: 01/29/2016  Time spent: >70mins  Recommendations for Outpatient Follow-up:  1. F/u with SNF MD, repeat cbc/bmp at follow up  Discharge Diagnoses:  Active Hospital Problems   Diagnosis Date Noted  . Pelvic fracture (HCC) 01/26/2016  . Fall   . Hand fracture   . AAA (abdominal aortic aneurysm) without rupture (HCC) 11/30/2015  . S/P CABG x 3 10/16/2015  . CAD (coronary artery disease), native coronary artery 10/08/2015    Resolved Hospital Problems   Diagnosis Date Noted Date Resolved  No resolved problems to display.    Discharge Condition: stable  Diet recommendation: heart healthy  Filed Weights   01/26/16 2010 01/27/16 0146  Weight: 45.4 kg (100 lb) 46.2 kg (101 lb 13.6 oz)    History of present illness:  HPI: Tonya Payne is a 81 y.o. female with medical history significant of CAD sp CABG in July of this year.  Patient presents to the ED at [facility] today following a mechanical fall.  She lost her balance and fell injuring her wrist and pelvis.  Unable to stand after fall.  Nothing makes pain better or worse.  Pain is severe, 8/10 currently.  Patient brought to ED by EMS.  ED Course: B suprapubic rami fractures and right wrist fracture.  orthopedics Dr Ophelia Charter consulted  Hospital Course:  Principal Problem:   Pelvic fracture [facility]) Active Problems:   CAD (coronary artery disease), native coronary artery   S/P CABG x 3   AAA (abdominal aortic aneurysm) without rupture (HCC)   Fall   Hand fracture   Pelvic and wrist fracture, nonoperative per ortho, pain control, PT/OT, snf placement  Hyponatremia:  Na 129 on admission, 134 at discharge improved on ivf, patient does look dehydrated on admission, encourage oral intake. Consider switch zoloft and zyprexa if dehydration and hyponatremia persists, to be determined at hospital follow  up.  H/o CABG in 10/2015, h/o post op afib on asa 325, statin, lopressor, no chest pain , no sob, NSR on tele  CKD III, cr seems close to baseline, renal dosing meds  H/o frequent falls, on fall prevention, PT/OT, snf placement  Code Status: DNR  Family Communication: patient   Disposition Plan: SNF   Consultants:  ortho  Procedures:  none  Antibiotics:  none   Discharge Exam: BP (!) 150/85   Pulse 96   Temp 97.9 F (36.6 C) (Oral)   Resp 18   Ht 5\' 1"  (1.549 m)   Wt 46.2 kg (101 lb 13.6 oz)   SpO2 95%   BMI 19.24 kg/m    General:  NAD, frail  Cardiovascular: RRR  Respiratory: CTABL  Abdomen: Soft/ND/NT, positive BS  Musculoskeletal: No Edema, right hand splinted, can move fingers  Neuro: aaix3, does has baseline impaired memory   Discharge Instructions You were cared for by a hospitalist during your hospital stay. If you have any questions about your discharge medications or the care you received while you were in the hospital after you are discharged, you can call the unit and asked to speak with the hospitalist on call if the hospitalist that took care of you is not available. Once you are discharged, your primary care physician will handle any further medical issues. Please note that NO REFILLS for any discharge medications will be authorized once you are discharged, as it is imperative that you return to your primary care physician (or  establish a relationship with a primary care physician if you do not have one) for your aftercare needs so that they can reassess your need for medications and monitor your lab values.  Discharge Instructions    Diet - low sodium heart healthy    Complete by:  As directed    Low fat diet   Increase activity slowly    Complete by:  As directed        Medication List    TAKE these medications   ALPRAZolam 0.25 MG tablet Commonly known as:  XANAX Take 1 tablet (0.25 mg total) by mouth 2 (two) times daily  as needed for anxiety. What changed:  when to take this  reasons to take this   atorvastatin 40 MG tablet Commonly known as:  LIPITOR Take 1 tablet (40 mg total) by mouth daily at 6 PM.   fexofenadine 180 MG tablet Commonly known as:  ALLEGRA Take 180 mg by mouth daily as needed for allergies or rhinitis.   HYDROcodone-acetaminophen 5-325 MG tablet Commonly known as:  NORCO/VICODIN Take 1 tablet by mouth at bedtime.   HYDROcodone-acetaminophen 5-325 MG tablet Commonly known as:  NORCO/VICODIN Take 1-2 tablets by mouth every 6 (six) hours as needed for moderate pain.   metoprolol tartrate 25 MG tablet Commonly known as:  LOPRESSOR Take 1 tablet (25 mg total) by mouth 2 (two) times daily.   potassium chloride 10 MEQ tablet Commonly known as:  K-DUR,KLOR-CON Take 1 tablet (10 mEq total) by mouth every Monday, Wednesday, and Friday. What changed:  when to take this   sertraline 50 MG tablet Commonly known as:  ZOLOFT Take 50 mg by mouth every morning.      No Known Allergies Follow-up Information    Rocky Morel, MD .   Specialty:  Internal Medicine Contact information: [phone] [address] [place] [address] [phone]        Eldred Manges, MD Follow up in 1 month(s).   Specialty:  Orthopedic Surgery Why:  right hand fracture, superior rami fracture  Contact information: [address] [place] [address] [phone]            The results of significant diagnostics from this hospitalization (including imaging, microbiology, ancillary and laboratory) are listed below for reference.    Significant Diagnostic Studies: Dg Elbow Complete Right (3+view)  Result Date: 01/26/2016 CLINICAL DATA:  Right elbow pain with laceration to the olecranon area after a fall today. EXAM: RIGHT ELBOW - COMPLETE 3+ VIEW COMPARISON:  None. FINDINGS: There is no evidence of fracture, dislocation, or joint effusion. There is no evidence of arthropathy  or other focal bone abnormality. Soft tissues are unremarkable. IMPRESSION: Negative. Electronically Signed   By: Burman Nieves M.D.   On: 01/26/2016 21:41   Dg Wrist Complete Right  Result Date: 01/26/2016 CLINICAL DATA:  Right wrist pain after a fall today. EXAM: RIGHT WRIST - COMPLETE 3+ VIEW COMPARISON:  02/25/2015 FINDINGS: Degenerative changes in the radiocarpal and STT joints. No fractures demonstrated in the carpal bones. Transverse fractures demonstrated in the distal right second and fourth metacarpal bones. Vague oblique linear lucency in the proximal second metacarpal bone could also represent nondisplaced fracture although this is not confirmed on all views. IMPRESSION: Degenerative changes in the right wrist. Fractures demonstrated in the right second and fourth metacarpal bones at the distal shaft. Possible nondisplaced fracture of the proximal second metacarpal bone. Carpal bones appear intact. Electronically Signed   By: Chrissie Noa  Andria Meuse M.D.   On: 01/26/2016 21:47   Ct Head Wo Contrast  Result Date: 01/26/2016 CLINICAL DATA:  Status post fall to right side with head injury. EXAM: CT HEAD WITHOUT CONTRAST CT CERVICAL SPINE WITHOUT CONTRAST TECHNIQUE: Multidetector CT imaging of the head and cervical spine was performed following the standard protocol without intravenous contrast. Multiplanar CT image reconstructions of the cervical spine were also generated. COMPARISON:  CT head and cervical spine 08/17/2014. FINDINGS: CT HEAD FINDINGS Brain: There has been interval development of lucencies within left lentiform nucleus and right posterior limb of internal capsule consistent with age-indeterminate lacunar infarcts. Additionally there is a chronic appearing infarct in the right cerebellar hemisphere that is new. Stable background of moderate chronic microvascular ischemic changes and mild parenchymal volume loss. No evidence of large acute infarct, hemorrhage, hydrocephalus, extra-axial  collection, for focal mass effect. Vascular: No hyperdense vessel. Calcific atherosclerosis carotid siphons. Skull: Normal. Negative for fracture or focal lesion. Sinuses/Orbits: No acute finding. Other: None. CT CERVICAL SPINE FINDINGS Alignment: Normal cervical lordosis. Minimal stable C7-T1 anterolisthesis. Anterior cervical discectomy and fusion of C5 through C7. Hardware appears intact. Skull base and vertebrae: No acute fracture. No primary bone lesion or focal pathologic process. Soft tissues and spinal canal: No lymphadenopathy or discrete cervical mass identified on this noncontrast study. The aerodigestive tract is patent. Normal thyroid gland. Dense calcifications of the carotid bifurcations bilaterally. No prevertebral fluid or swelling. No visible canal hematoma.- Disc levels: Degenerative changes of the cervical spine mild multilevel facet arthropathy and discogenic changes with marginal osteophytes and disc space narrowing most pronounced at C7-T1. Disc bulges at the C3-4 and C4-5 levels. Upper chest: Scarring with calcifications in the lung apices. Other: None. IMPRESSION: 1. No skull fracture or intracranial hemorrhage identified. 2. Interval age-indeterminate lacunar infarcts in the basal ganglia bilaterally from 2016. 3. Interval chronic appearing small right occipital lobe infarct. 4. Stable background of moderate chronic microvascular ischemic changes and mild parenchymal volume loss. 5. No acute fracture or dislocation of the cervical spine. 6. Multilevel cervical degenerative changes and intact anterior cervical discectomy and fusion hardware are stable. Electronically Signed   By: Mitzi Hansen M.D.   On: 01/26/2016 22:09   Ct Cervical Spine Wo Contrast  Result Date: 01/26/2016 CLINICAL DATA:  Status post fall to right side with head injury. EXAM: CT HEAD WITHOUT CONTRAST CT CERVICAL SPINE WITHOUT CONTRAST TECHNIQUE: Multidetector CT imaging of the head and cervical spine was  performed following the standard protocol without intravenous contrast. Multiplanar CT image reconstructions of the cervical spine were also generated. COMPARISON:  CT head and cervical spine 08/17/2014. FINDINGS: CT HEAD FINDINGS Brain: There has been interval development of lucencies within left lentiform nucleus and right posterior limb of internal capsule consistent with age-indeterminate lacunar infarcts. Additionally there is a chronic appearing infarct in the right cerebellar hemisphere that is new. Stable background of moderate chronic microvascular ischemic changes and mild parenchymal volume loss. No evidence of large acute infarct, hemorrhage, hydrocephalus, extra-axial collection, for focal mass effect. Vascular: No hyperdense vessel. Calcific atherosclerosis carotid siphons. Skull: Normal. Negative for fracture or focal lesion. Sinuses/Orbits: No acute finding. Other: None. CT CERVICAL SPINE FINDINGS Alignment: Normal cervical lordosis. Minimal stable C7-T1 anterolisthesis. Anterior cervical discectomy and fusion of C5 through C7. Hardware appears intact. Skull base and vertebrae: No acute fracture. No primary bone lesion or focal pathologic process. Soft tissues and spinal canal: No lymphadenopathy or discrete cervical mass identified on this noncontrast study. The aerodigestive tract is  patent. Normal thyroid gland. Dense calcifications of the carotid bifurcations bilaterally. No prevertebral fluid or swelling. No visible canal hematoma.- Disc levels: Degenerative changes of the cervical spine mild multilevel facet arthropathy and discogenic changes with marginal osteophytes and disc space narrowing most pronounced at C7-T1. Disc bulges at the C3-4 and C4-5 levels. Upper chest: Scarring with calcifications in the lung apices. Other: None. IMPRESSION: 1. No skull fracture or intracranial hemorrhage identified. 2. Interval age-indeterminate lacunar infarcts in the basal ganglia bilaterally from 2016. 3.  Interval chronic appearing small right occipital lobe infarct. 4. Stable background of moderate chronic microvascular ischemic changes and mild parenchymal volume loss. 5. No acute fracture or dislocation of the cervical spine. 6. Multilevel cervical degenerative changes and intact anterior cervical discectomy and fusion hardware are stable. Electronically Signed   By: Mitzi HansenLance  Furusawa-Stratton M.D.   On: 01/26/2016 22:09   Ct Pelvis Wo Contrast  Result Date: 01/26/2016 CLINICAL DATA:  Unwitnessed fall 1.5 hours ago. Right-sided body pain. EXAM: CT PELVIS WITHOUT CONTRAST TECHNIQUE: Multidetector CT imaging of the pelvis was performed following the standard protocol without intravenous contrast. Bone algorithm images were obtained for evaluation of pelvis and hips. COMPARISON:  Right hip 01/26/2016. CT abdomen and pelvis 10/23/2015. FINDINGS: Urinary Tract: Technically limited evaluation of the urinary tract due to bone algorithm. Bladder is not abnormally distended and no wall thickening or filling defect noted. Distal ureters are dilated. Bowel: Technically limited evaluation due bone algorithm. Visualize pelvic portion of the colon and small bowel are not abnormally distended. Stool in the colon. Vascular/Lymphatic: Technically limited evaluation due to bone algorithm. No significant lymphadenopathy demonstrated. There is an aneurysm of the abdominal aorta, incompletely included within the field of view, visualized portion measuring 4 cm maximal AP diameter. Peripheral calcification. The aneurysm was present on the previous examination, measuring 4.8 cm maximal diameter. Comparison between the 2 studies is not possible because the entire aortic aneurysm is not included within current study. Reproductive: Technically limited due to bone algorithm. Uterus appears to be surgically absent. No focal pelvic mass lesions. Other: No evidence of significant muscular hematoma or infiltration. Musculoskeletal: There is a  nondisplaced mildly comminuted fracture of the superior pubic ramus on the right. Nondisplaced fracture of the left superior pubic ramus adjacent to the symphysis pubis. No evidence of pubic diastases. Fractures do not involve the symphysis pubis. Inferior pubic rami, acetabuli, pelvis, and hips are otherwise unremarkable. No additional fractures are demonstrated. SI joints are not displaced. Postoperative changes in the lower lumbar spine with lower lumbar laminectomies. IMPRESSION: Nondisplaced fractures of the right and left superior pubic rami anteriorly. No additional acute fractures demonstrated. Incidental note of a abdominal aortic aneurysm, incompletely included within the field of view. This has been present on previous studies. Comparison is not reliable due to incomplete inclusion of the aortic aneurysm on today's study. Electronically Signed   By: Burman NievesWilliam  Stevens M.D.   On: 01/26/2016 21:59   Dg Hand Complete Right  Result Date: 01/26/2016 CLINICAL DATA:  Pain, contusion, and decreased range of motion of the right hand after a fall today. EXAM: RIGHT HAND - COMPLETE 3+ VIEW COMPARISON:  Right wrist 02/25/2015 FINDINGS: There is an acute transverse mildly comminuted fracture of the distal shaft of the right second finger with mild volar angulation. Small focal cortical irregularity and linear lucency in the distal shaft of the fourth metacarpal bone suggesting nondisplaced fracture no other acute fractures identified. Degenerative changes in the interphalangeal joints. Degenerative changes in the STT  joints. Soft tissue swelling. IMPRESSION: Transverse mildly comminuted fracture of the distal shaft right second metacarpal bone. Nondisplaced fracture of the distal shaft right fourth metacarpal bone. Electronically Signed   By: Burman Nieves M.D.   On: 01/26/2016 21:40   Dg Hip Unilat W Or Wo Pelvis 2-3 Views Right  Result Date: 01/26/2016 CLINICAL DATA:  Pain following fall EXAM: DG HIP (WITH  OR WITHOUT PELVIS) 2-3V RIGHT COMPARISON:  None. FINDINGS: Frontal pelvis as well as frontal and lateral right hip images were obtained. There is a small avulsion arising from the dorsal aspect of the medial superior pubic ramus on the right. No other evidence of fracture. No dislocation. Bones are osteoporotic. There is mild symmetric narrowing of both hip joints. No erosive change. There is postoperative change at L5. There are foci of arterial vascular calcification in the common and external iliac arteries bilaterally. IMPRESSION: Small avulsion along the dorsal aspect of the medial portion of the right superior pubic ramus. No other evidence of fracture. No dislocation. Mild symmetric narrowing both hip joints. Bones appear osteoporotic. There is arterial vascular atherosclerosis. Electronically Signed   By: Bretta Bang III M.D.   On: 01/26/2016 21:43    Microbiology: Recent Results (from the past 240 hour(s))  Urine culture     Status: Abnormal   Collection Time: 01/27/16  1:09 PM  Result Value Ref Range Status   Specimen Description URINE, RANDOM  Final   Special Requests NONE  Final   Culture MULTIPLE SPECIES PRESENT, SUGGEST RECOLLECTION (A)  Final   Report Status 01/28/2016 FINAL  Final     Labs: Basic Metabolic Panel:  Recent Labs Lab 01/26/16 2240 01/27/16 0839 01/28/16 0235 01/29/16 0408  NA 129* 132* 133* 134*  K 4.5 4.3 4.4 4.1  CL 97* 95* 102 104  CO2 23 26 21* 20*  GLUCOSE 112* 103* 93 109*  BUN 22* 18 15 13   CREATININE 1.06* 1.24* 1.13* 0.90  CALCIUM 9.2 9.7 9.2 9.2  MG  --   --  1.9  --    Liver Function Tests: No results for input(s): AST, ALT, ALKPHOS, BILITOT, PROT, ALBUMIN in the last 168 hours. No results for input(s): LIPASE, AMYLASE in the last 168 hours. No results for input(s): AMMONIA in the last 168 hours. CBC:  Recent Labs Lab 01/26/16 2240 01/28/16 0235 01/29/16 0408  WBC 11.1* 7.6 7.9  NEUTROABS 8.4*  --   --   HGB 10.8* 10.7* 10.9*   HCT 32.2* 33.7* 34.5*  MCV 89.9 91.6 91.5  PLT 300 304 300   Cardiac Enzymes: No results for input(s): CKTOTAL, CKMB, CKMBINDEX, TROPONINI in the last 168 hours. BNP: BNP (last 3 results) No results for input(s): BNP in the last 8760 hours.  ProBNP (last 3 results) No results for input(s): PROBNP in the last 8760 hours.  CBG: No results for input(s): GLUCAP in the last 168 hours.     SignedAlbertine Grates MD, PhD  [facility] 01/29/2016, 1:22 PM

## 2016-01-29 NOTE — Clinical Social Work Note (Signed)
Clinical Social Work Assessment  Patient Details  Name: Tonya Payne MRN: [id number] Date of Birth: 09-06-1934  Date of referral:  01/29/16               Reason for consult:                   Permission sought to share information with:  Facility Sport and exercise psychologist, Family Supports Permission granted to share information::  Yes, Verbal Permission Granted  Name::     Hagman,Timothy D.  Agency::  SNFs  Relationship::  Son  Contact Information:     Housing/Transportation Living arrangements for the past 2 months:  [place] of Information:  Patient Patient Interpreter Needed:  None Criminal Activity/Legal Involvement Pertinent to Current Situation/Hospitalization:  No - Comment as needed Significant Relationships:  Adult Children Lives with:  Self Do you feel safe going back to the place where you live?  Yes Need for family participation in patient care:  Yes (Comment)  Care giving concerns: The patient is agreeable for short term rehab at discharge. Patient would like to rebuild her strength to return home.   Social Worker assessment / plan:  CSW met with patient at beside to complete assessment. Patient was resting comfortably in bed. CSW explained PT recommendation for SNF placement. CSW explained SNF search and placement process to the patient and answered her questions. Patient reported her supports as her son Tonya Payne.Hagman. Patient reported she would like her son to decide on SNF facility.  Per patient request CSW called Tonya Payne. He reported his preference if for the patient to go to [facility]. CSW will follow up with bed offers.   Employment status:  Retired Forensic scientist:  [employer] PT Recommendations:  [place] / Referral to community resources:  [place]  Patient/Family's Response to care:  The patient appears happy with the care she is receiving in hospital and is appreciative of CSW  assistance.  Patient/Family's Understanding of and Emotional Response to Diagnosis, Current Treatment, and Prognosis:  The patient has a good understanding of why she was admitted. She understands the care plan and what she will need post discharge.  Emotional Assessment Appearance:  Appears stated age Attitude/Demeanor/Rapport:   (Patient is appropriate. ) Affect (typically observed):  Accepting, Calm, Appropriate Orientation:  Oriented to Self, Oriented to Situation, Oriented to Place, Oriented to  Time Alcohol / Substance use:  Not Applicable Psych involvement (Current and /or in the community):     Discharge Needs  Concerns to be addressed:  Discharge Planning Concerns Readmission within the last 30 days:  No Current discharge risk:  Physical Impairment Barriers to Discharge:  No Barriers Identified   Tonya Dry, LCSW 01/29/2016, 1:57 PM

## 2016-01-29 NOTE — Clinical Social Work Placement (Signed)
   CLINICAL SOCIAL WORK PLACEMENT  NOTE  Date:  01/29/2016  Patient Details  Name: Tonya Payne MRN: [id number] Date of Birth: October 23, 1934  Clinical Social Work is seeking post-discharge placement for this patient at the Skilled  Nursing Facility level of care (*CSW will initial, date and re-position this form in  chart as items are completed):  Yes   Patient/family provided with [place] Clinical Social Work Department's list of facilities offering this level of care within the geographic area requested by the patient (or if unable, by the patient's family).  Yes   Patient/family informed of their freedom to choose among providers that offer the needed level of care, that participate in Medicare, Medicaid or managed care program needed by the patient, have an available bed and are willing to accept the patient.  Yes   Patient/family informed of 's ownership interest in [facility] and [facility], as well as of the fact that they are under no obligation to receive care at these facilities.  PASRR submitted to EDS on       PASRR number received on       Existing PASRR number confirmed on 01/29/16     FL2 transmitted to all facilities in geographic area requested by pt/family on 01/29/16     FL2 transmitted to all facilities within larger geographic area on       Patient informed that his/her managed care company has contracts with or will negotiate with certain facilities, including the following:        Yes   Patient/family informed of bed offers received.  Patient chooses bed at       Physician recommends and patient chooses bed at      [facility] 01/[facility].  Patient to be transferred to facility by ambulance     Patient family notified on 01/29/16 of transfer.  Name of family member notified:  Hagman,Timothy D.     PHYSICIAN Please prepare priority discharge summary, including medications, Please prepare  prescriptions, Please sign FL2     Additional Comment:  Per MD patient [facility] . RN, patient, patient's family, and facility notified of discharge. RN given phone number for report and transport packet is on patient's chart. Ambulance transport requested. CSW signing off.   _______________________________________________ Reggy EyeLaShonda A Zyir Gassert, LCSW 01/29/2016, 4:22 PM

## 2016-01-29 NOTE — Progress Notes (Signed)
01/29/2016  1700 Pt discharged to [facility] via [facility]. Kathryne HitchAllen, Tinika Bucknam C

## 2016-01-30 ENCOUNTER — Encounter: Payer: Self-pay | Admitting: Internal Medicine

## 2016-01-30 ENCOUNTER — Non-Acute Institutional Stay (SKILLED_NURSING_FACILITY): Payer: Medicare Other | Admitting: Internal Medicine

## 2016-01-30 DIAGNOSIS — D638 Anemia in other chronic diseases classified elsewhere: Secondary | ICD-10-CM

## 2016-01-30 DIAGNOSIS — I714 Abdominal aortic aneurysm, without rupture, unspecified: Secondary | ICD-10-CM

## 2016-01-30 DIAGNOSIS — K5900 Constipation, unspecified: Secondary | ICD-10-CM | POA: Diagnosis not present

## 2016-01-30 DIAGNOSIS — E871 Hypo-osmolality and hyponatremia: Secondary | ICD-10-CM

## 2016-01-30 DIAGNOSIS — R531 Weakness: Secondary | ICD-10-CM

## 2016-01-30 DIAGNOSIS — I2510 Atherosclerotic heart disease of native coronary artery without angina pectoris: Secondary | ICD-10-CM | POA: Diagnosis not present

## 2016-01-30 DIAGNOSIS — E785 Hyperlipidemia, unspecified: Secondary | ICD-10-CM

## 2016-01-30 DIAGNOSIS — S32810S Multiple fractures of pelvis with stable disruption of pelvic ring, sequela: Secondary | ICD-10-CM | POA: Diagnosis not present

## 2016-01-30 DIAGNOSIS — E876 Hypokalemia: Secondary | ICD-10-CM | POA: Diagnosis not present

## 2016-01-30 DIAGNOSIS — S62101S Fracture of unspecified carpal bone, right wrist, sequela: Secondary | ICD-10-CM

## 2016-01-30 DIAGNOSIS — N183 Chronic kidney disease, stage 3 unspecified: Secondary | ICD-10-CM

## 2016-01-30 DIAGNOSIS — F329 Major depressive disorder, single episode, unspecified: Secondary | ICD-10-CM

## 2016-01-30 NOTE — Progress Notes (Signed)
LOCATION: [facility]  PCP: Rocky Morel, MD   Code Status: DNR  Goals of care: Advanced Directive information Advanced Directives 01/30/2016  Does patient have an advance directive? Yes  Type of Advance Directive Out of facility DNR (pink MOST or yellow form)  Does patient want to make changes to advanced directive? No - Patient declined  Copy of advanced directive(s) in chart? Yes  Would patient like information on creating an advanced directive? -       Extended Emergency Contact Information Primary Emergency Contact: Wright,Valie-Venturella Address: [address]          [place], [place] [address] [place] Home Phone: [phone] Mobile Phone: [phone] Relation: Daughter Secondary Emergency Contact: Hagman,Timothy D. Address: [address]          [place], [place] [address] [place] Mobile Phone: [phone] Relation: Son   No Known Allergies  Chief Complaint  Patient presents with  . New Admit To SNF    New Admissison Visit     HPI:  Patient is a 81 y.o. female seen today for short term rehabilitation post hospital admission from 01/26/16-01/29/16 with bilateral superior pubic rami fracture and minimally displaced right hand 3rd metacarpal fracture post fall. She was seen by orthopedic and conservative management was recommended. She also had hyponatremia and received iv fluids. She has PMH of CAD s/p CABG, afib, CKD 3 among others. She is seen in her room today.   Review of Systems:  Constitutional: Negative for fever, chills, malaise and diaphoresis. Feels weak and tired. HENT: Negative for congestion, nasal discharge, hearing loss, sore throat, difficulty swallowing. Positive for headache.   Eyes: Negative for blurred vision, double vision and discharge.  Respiratory: Negative for cough, shortness of breath and wheezing.   Cardiovascular: Negative for chest pain, palpitations, leg swelling.  Gastrointestinal:  Negative for heartburn, nausea, vomiting, abdominal pain, loss of appetite, melena, diarrhea and constipation. Passing some gas. Genitourinary: Negative for dysuria and flank pain.  Musculoskeletal: Negative for back pain, fall in the facility. pain medication has been helpful. Skin: Negative for itching, rash.  Neurological: Negative for weakness. Positive for occasional dizziness. Psychiatric/Behavioral: Negative for depression, anxiety, insomnia and memory loss.    Past Medical History:  Diagnosis Date  . AAA (abdominal aortic aneurysm) (HCC)   . Abdominal aneurysm (HCC)    CTA 10/23/2015  . Anxiety   . Atrial fibrillation, new onset (HCC) 10/2015   after CABG, in SR at d/c  . CAD (coronary artery disease), native coronary artery 10/08/2015  . Congestive dilated cardiomyopathy (HCC) 10/08/2015  . COPD (chronic obstructive pulmonary disease) (HCC)   . Depression   . Hyperlipidemia LDL goal <70 10/08/2015  . IBS (irritable bowel syndrome)   . Oral thrush   . Post PTCA   . TIA (transient ischemic attack)    Past Surgical History:  Procedure Laterality Date  . BACK SURGERY    . CARDIAC CATHETERIZATION N/A 10/06/2015   Procedure: Left Heart Cath and Coronary Angiography;  Surgeon: Marykay Lex, MD;  Location: [facility] INVASIVE CV LAB;  Service: Cardiovascular;  Laterality: N/A;  . CARDIAC CATHETERIZATION N/A 10/06/2015   Procedure: Coronary Balloon Angioplasty;  Surgeon: Marykay Lex, MD;  Location: [facility] INVASIVE CV LAB;  Service: Cardiovascular;  Laterality: N/A;  . COLECTOMY    . CORONARY ARTERY BYPASS GRAFT N/A 10/16/2015   Procedure: CORONARY ARTERY BYPASS GRAFTING (CABG)  x three, using left internal mammary artery and right  leg greater saphenous vein harvested endoscopically;  Surgeon: Loreli Slot, MD;  Location: [facility] OR;  Service: Open Heart Surgery;  Laterality: N/A;  . NECK SURGERY    . TEE WITHOUT CARDIOVERSION N/A 10/16/2015   Procedure: TRANSESOPHAGEAL ECHOCARDIOGRAM (TEE);   Surgeon: Loreli Slot, MD;  Location: [facility] OR;  Service: Open Heart Surgery;  Laterality: N/A;   Social History:   reports that she quit smoking about 9 months ago. Her smoking use included Cigarettes. She smoked 0.50 packs per day. She has never used smokeless tobacco. She reports that she drinks about 1.2 oz of alcohol per week . She reports that she does not use drugs.  Family History  Problem Relation Age of Onset  . Stroke Mother     dead  . Clotting disorder Father     dead  bone marrow disease    Medications:   Medication List       Accurate as of 01/30/16 10:38 AM. Always use your most recent med list.          ALPRAZolam 0.25 MG tablet Commonly known as:  XANAX Take 1 tablet (0.25 mg total) by mouth 2 (two) times daily as needed for anxiety.   atorvastatin 40 MG tablet Commonly known as:  LIPITOR Take 1 tablet (40 mg total) by mouth daily at 6 PM.   fexofenadine 180 MG tablet Commonly known as:  ALLEGRA Take 180 mg by mouth daily as needed for allergies or rhinitis.   HYDROcodone-acetaminophen 5-325 MG tablet Commonly known as:  NORCO/VICODIN Take 1 tablet by mouth at bedtime.   HYDROcodone-acetaminophen 5-325 MG tablet Commonly known as:  NORCO/VICODIN Take 1-2 tablets by mouth every 6 (six) hours as needed for moderate pain.   metoprolol tartrate 25 MG tablet Commonly known as:  LOPRESSOR Take 1 tablet (25 mg total) by mouth 2 (two) times daily.   potassium chloride 10 MEQ tablet Commonly known as:  K-DUR,KLOR-CON Take 1 tablet (10 mEq total) by mouth every Monday, Wednesday, and Friday.   sertraline 50 MG tablet Commonly known as:  ZOLOFT Take 50 mg by mouth every morning.       Immunizations: Immunization History  Administered Date(s) Administered  . Influenza,inj,Quad PF,36+ Mos 01/29/2016  . PPD Test 10/30/2015, 11/13/2015  . Tdap 08/17/2014     Physical Exam:  Vitals:   01/30/16 1033  BP: (!) 142/88  Pulse: 86  Resp: 20    Temp: 97.2 F (36.2 C)  TempSrc: Oral  SpO2: 98%  Weight: 101 lb 6.4 oz (46 kg)  Height: 4\' 11"  (1.499 m)   Body mass index is 20.48 kg/m.  General- elderly female, thin built, in no acute distress Head- normocephalic, atraumatic Throat- moist mucus membrane Eyes- PERRLA, EOMI, no pallor, no icterus, no discharge, normal conjunctiva, normal sclera Neck- no cervical lymphadenopathy Cardiovascular- normal s1,s2, no murmur Respiratory- bilateral clear to auscultation, no wheeze, no rhonchi, no crackles, no use of accessory muscles Abdomen- bowel sounds present, soft, non tender Musculoskeletal- able to move all 4 extremities, ace wrap to right arm and able to move her fingers, mild swelling to right arm, arthritis changes to fingers, no leg edema Neurological- alert and oriented to person, place and time Skin- warm and dry Psychiatry- normal mood and affect    Labs reviewed: Basic Metabolic Panel:  Recent Labs  16/10/96 0426 10/17/15 1605  01/27/16 0839 01/28/16 0235 01/29/16 0408  NA 138  --   < > 132* 133* 134*  K 4.6  --   < >  4.3 4.4 4.1  CL 109  --   < > 95* 102 104  CO2 20*  --   < > 26 21* 20*  GLUCOSE 135*  --   < > 103* 93 109*  BUN 10  --   < > 18 15 13   CREATININE 1.18* 1.60*  < > 1.24* 1.13* 0.90  CALCIUM 8.2*  --   < > 9.7 9.2 9.2  MG 2.4 2.2  --   --  1.9  --   < > = values in this interval not displayed. Liver Function Tests:  Recent Labs  02/25/15 1850 10/06/15 1506  AST 29 30  ALT 6* 14  ALKPHOS 49 63  BILITOT 0.9 0.9  PROT 6.1* 7.0  ALBUMIN 3.4* 3.6   No results for input(s): LIPASE, AMYLASE in the last 8760 hours. No results for input(s): AMMONIA in the last 8760 hours. CBC:  Recent Labs  02/25/15 1850  10/06/15 1506  01/26/16 2240 01/28/16 0235 01/29/16 0408  WBC 9.7  --  10.3  < > 11.1* 7.6 7.9  NEUTROABS 7.1  --  7.4  --  8.4*  --   --   HGB 13.2  < > 11.9*  < > 10.8* 10.7* 10.9*  HCT 37.1  < > 36.3  < > 32.2* 33.7* 34.5*   MCV 95.1  --  94.0  < > 89.9 91.6 91.5  PLT 276  --  330  < > 300 304 300  < > = values in this interval not displayed. Cardiac Enzymes:  Recent Labs  10/07/15 0326 10/07/15 0924 10/07/15 1619  TROPONINI 3.22* 3.27* 2.67*   BNP: Invalid input(s): POCBNP CBG:  Recent Labs  10/21/15 1705 10/22/15 0559 10/22/15 1358  GLUCAP 124* 97 133*    Radiological Exams: Dg Elbow Complete Right (3+view)  Result Date: 01/26/2016 CLINICAL DATA:  Right elbow pain with laceration to the olecranon area after a fall today. EXAM: RIGHT ELBOW - COMPLETE 3+ VIEW COMPARISON:  None. FINDINGS: There is no evidence of fracture, dislocation, or joint effusion. There is no evidence of arthropathy or other focal bone abnormality. Soft tissues are unremarkable. IMPRESSION: Negative. Electronically Signed   By: Burman Nieves M.D.   On: 01/26/2016 21:41   Dg Wrist Complete Right  Result Date: 01/26/2016 CLINICAL DATA:  Right wrist pain after a fall today. EXAM: RIGHT WRIST - COMPLETE 3+ VIEW COMPARISON:  02/25/2015 FINDINGS: Degenerative changes in the radiocarpal and STT joints. No fractures demonstrated in the carpal bones. Transverse fractures demonstrated in the distal right second and fourth metacarpal bones. Vague oblique linear lucency in the proximal second metacarpal bone could also represent nondisplaced fracture although this is not confirmed on all views. IMPRESSION: Degenerative changes in the right wrist. Fractures demonstrated in the right second and fourth metacarpal bones at the distal shaft. Possible nondisplaced fracture of the proximal second metacarpal bone. Carpal bones appear intact. Electronically Signed   By: Burman Nieves M.D.   On: 01/26/2016 21:47   Ct Head Wo Contrast  Result Date: 01/26/2016 CLINICAL DATA:  Status post fall to right side with head injury. EXAM: CT HEAD WITHOUT CONTRAST CT CERVICAL SPINE WITHOUT CONTRAST TECHNIQUE: Multidetector CT imaging of the head and  cervical spine was performed following the standard protocol without intravenous contrast. Multiplanar CT image reconstructions of the cervical spine were also generated. COMPARISON:  CT head and cervical spine 08/17/2014. FINDINGS: CT HEAD FINDINGS Brain: There has been interval development of lucencies within left  lentiform nucleus and right posterior limb of internal capsule consistent with age-indeterminate lacunar infarcts. Additionally there is a chronic appearing infarct in the right cerebellar hemisphere that is new. Stable background of moderate chronic microvascular ischemic changes and mild parenchymal volume loss. No evidence of large acute infarct, hemorrhage, hydrocephalus, extra-axial collection, for focal mass effect. Vascular: No hyperdense vessel. Calcific atherosclerosis carotid siphons. Skull: Normal. Negative for fracture or focal lesion. Sinuses/Orbits: No acute finding. Other: None. CT CERVICAL SPINE FINDINGS Alignment: Normal cervical lordosis. Minimal stable C7-T1 anterolisthesis. Anterior cervical discectomy and fusion of C5 through C7. Hardware appears intact. Skull base and vertebrae: No acute fracture. No primary bone lesion or focal pathologic process. Soft tissues and spinal canal: No lymphadenopathy or discrete cervical mass identified on this noncontrast study. The aerodigestive tract is patent. Normal thyroid gland. Dense calcifications of the carotid bifurcations bilaterally. No prevertebral fluid or swelling. No visible canal hematoma.- Disc levels: Degenerative changes of the cervical spine mild multilevel facet arthropathy and discogenic changes with marginal osteophytes and disc space narrowing most pronounced at C7-T1. Disc bulges at the C3-4 and C4-5 levels. Upper chest: Scarring with calcifications in the lung apices. Other: None. IMPRESSION: 1. No skull fracture or intracranial hemorrhage identified. 2. Interval age-indeterminate lacunar infarcts in the basal ganglia  bilaterally from 2016. 3. Interval chronic appearing small right occipital lobe infarct. 4. Stable background of moderate chronic microvascular ischemic changes and mild parenchymal volume loss. 5. No acute fracture or dislocation of the cervical spine. 6. Multilevel cervical degenerative changes and intact anterior cervical discectomy and fusion hardware are stable. Electronically Signed   By: Mitzi Hansen M.D.   On: 01/26/2016 22:09   Ct Cervical Spine Wo Contrast  Result Date: 01/26/2016 CLINICAL DATA:  Status post fall to right side with head injury. EXAM: CT HEAD WITHOUT CONTRAST CT CERVICAL SPINE WITHOUT CONTRAST TECHNIQUE: Multidetector CT imaging of the head and cervical spine was performed following the standard protocol without intravenous contrast. Multiplanar CT image reconstructions of the cervical spine were also generated. COMPARISON:  CT head and cervical spine 08/17/2014. FINDINGS: CT HEAD FINDINGS Brain: There has been interval development of lucencies within left lentiform nucleus and right posterior limb of internal capsule consistent with age-indeterminate lacunar infarcts. Additionally there is a chronic appearing infarct in the right cerebellar hemisphere that is new. Stable background of moderate chronic microvascular ischemic changes and mild parenchymal volume loss. No evidence of large acute infarct, hemorrhage, hydrocephalus, extra-axial collection, for focal mass effect. Vascular: No hyperdense vessel. Calcific atherosclerosis carotid siphons. Skull: Normal. Negative for fracture or focal lesion. Sinuses/Orbits: No acute finding. Other: None. CT CERVICAL SPINE FINDINGS Alignment: Normal cervical lordosis. Minimal stable C7-T1 anterolisthesis. Anterior cervical discectomy and fusion of C5 through C7. Hardware appears intact. Skull base and vertebrae: No acute fracture. No primary bone lesion or focal pathologic process. Soft tissues and spinal canal: No lymphadenopathy or  discrete cervical mass identified on this noncontrast study. The aerodigestive tract is patent. Normal thyroid gland. Dense calcifications of the carotid bifurcations bilaterally. No prevertebral fluid or swelling. No visible canal hematoma.- Disc levels: Degenerative changes of the cervical spine mild multilevel facet arthropathy and discogenic changes with marginal osteophytes and disc space narrowing most pronounced at C7-T1. Disc bulges at the C3-4 and C4-5 levels. Upper chest: Scarring with calcifications in the lung apices. Other: None. IMPRESSION: 1. No skull fracture or intracranial hemorrhage identified. 2. Interval age-indeterminate lacunar infarcts in the basal ganglia bilaterally from 2016. 3. Interval chronic appearing small  right occipital lobe infarct. 4. Stable background of moderate chronic microvascular ischemic changes and mild parenchymal volume loss. 5. No acute fracture or dislocation of the cervical spine. 6. Multilevel cervical degenerative changes and intact anterior cervical discectomy and fusion hardware are stable. Electronically Signed   By: Mitzi Hansen M.D.   On: 01/26/2016 22:09   Ct Pelvis Wo Contrast  Result Date: 01/26/2016 CLINICAL DATA:  Unwitnessed fall 1.5 hours ago. Right-sided body pain. EXAM: CT PELVIS WITHOUT CONTRAST TECHNIQUE: Multidetector CT imaging of the pelvis was performed following the standard protocol without intravenous contrast. Bone algorithm images were obtained for evaluation of pelvis and hips. COMPARISON:  Right hip 01/26/2016. CT abdomen and pelvis 10/23/2015. FINDINGS: Urinary Tract: Technically limited evaluation of the urinary tract due to bone algorithm. Bladder is not abnormally distended and no wall thickening or filling defect noted. Distal ureters are dilated. Bowel: Technically limited evaluation due bone algorithm. Visualize pelvic portion of the colon and small bowel are not abnormally distended. Stool in the colon.  Vascular/Lymphatic: Technically limited evaluation due to bone algorithm. No significant lymphadenopathy demonstrated. There is an aneurysm of the abdominal aorta, incompletely included within the field of view, visualized portion measuring 4 cm maximal AP diameter. Peripheral calcification. The aneurysm was present on the previous examination, measuring 4.8 cm maximal diameter. Comparison between the 2 studies is not possible because the entire aortic aneurysm is not included within current study. Reproductive: Technically limited due to bone algorithm. Uterus appears to be surgically absent. No focal pelvic mass lesions. Other: No evidence of significant muscular hematoma or infiltration. Musculoskeletal: There is a nondisplaced mildly comminuted fracture of the superior pubic ramus on the right. Nondisplaced fracture of the left superior pubic ramus adjacent to the symphysis pubis. No evidence of pubic diastases. Fractures do not involve the symphysis pubis. Inferior pubic rami, acetabuli, pelvis, and hips are otherwise unremarkable. No additional fractures are demonstrated. SI joints are not displaced. Postoperative changes in the lower lumbar spine with lower lumbar laminectomies. IMPRESSION: Nondisplaced fractures of the right and left superior pubic rami anteriorly. No additional acute fractures demonstrated. Incidental note of a abdominal aortic aneurysm, incompletely included within the field of view. This has been present on previous studies. Comparison is not reliable due to incomplete inclusion of the aortic aneurysm on today's study. Electronically Signed   By: Burman Nieves M.D.   On: 01/26/2016 21:59   Dg Hand Complete Right  Result Date: 01/26/2016 CLINICAL DATA:  Pain, contusion, and decreased range of motion of the right hand after a fall today. EXAM: RIGHT HAND - COMPLETE 3+ VIEW COMPARISON:  Right wrist 02/25/2015 FINDINGS: There is an acute transverse mildly comminuted fracture of the  distal shaft of the right second finger with mild volar angulation. Small focal cortical irregularity and linear lucency in the distal shaft of the fourth metacarpal bone suggesting nondisplaced fracture no other acute fractures identified. Degenerative changes in the interphalangeal joints. Degenerative changes in the STT joints. Soft tissue swelling. IMPRESSION: Transverse mildly comminuted fracture of the distal shaft right second metacarpal bone. Nondisplaced fracture of the distal shaft right fourth metacarpal bone. Electronically Signed   By: Burman Nieves M.D.   On: 01/26/2016 21:40   Dg Hip Unilat W Or Wo Pelvis 2-3 Views Right  Result Date: 01/26/2016 CLINICAL DATA:  Pain following fall EXAM: DG HIP (WITH OR WITHOUT PELVIS) 2-3V RIGHT COMPARISON:  None. FINDINGS: Frontal pelvis as well as frontal and lateral right hip images were obtained. There is  a small avulsion arising from the dorsal aspect of the medial superior pubic ramus on the right. No other evidence of fracture. No dislocation. Bones are osteoporotic. There is mild symmetric narrowing of both hip joints. No erosive change. There is postoperative change at L5. There are foci of arterial vascular calcification in the common and external iliac arteries bilaterally. IMPRESSION: Small avulsion along the dorsal aspect of the medial portion of the right superior pubic ramus. No other evidence of fracture. No dislocation. Mild symmetric narrowing both hip joints. Bones appear osteoporotic. There is arterial vascular atherosclerosis. Electronically Signed   By: Bretta BangWilliam  Woodruff III M.D.   On: 01/26/2016 21:43    Assessment/Plan  Generalized weakness Will have patient work with PT/OT as tolerated to regain strength and restore function.  Fall precautions are in place.  Right wrist fracture No surgery performed. Monitor clinically and provide conservative management. Continue norco 5-325 mg 1-2 tab q6h prn pain. Will have her work with  physical therapy and occupational therapy team to help with gait training and muscle strengthening exercises.fall precautions. Skin care. Encourage to be out of bed.  Pelvic fracture Will have patient work with PT/OT as tolerated to regain strength and restore function.  Fall precautions are in place. Pain management and fall precautions. Will have her on aspirin ec 325 mg daily for dvt prophylaxis given her limited mobility  CAD Monitor clinically, chest pain free, continue lopressor 25 mg bid, check bmp. Started on aspirin.  Hyponatremia aaox 3. Monitor bmp  Hypokalemia Continue kcl supplement  Constipation Slow transit, add colace 100 mg daily and monitor  Anemia of chronic disease Monitor bmp  Chronic depression Continue sertraline 50 mg daily  HLD Continue lipitor for now  ckd 3 Monitor bmp  AAA Awaits elective surgery for aneurysm repair. Seen by vascular team and will need f/u AAA ultrasound in 1 month. No abdominal pain at present.    Goals of care: short term rehabilitation   Labs/tests ordered: cbc, cmp 02/01/16  Family/ staff Communication: reviewed care plan with patient and nursing supervisor    Oneal GroutMAHIMA Paysley Poplar, MD Internal Medicine [facility] [address] [place], [place] [address] Cell Phone (Monday-Friday 8 am - 5 pm): [phone] On Call: [phone] and follow prompts after 5 pm and on weekends Office Phone: [phone] Office Fax: [phone]

## 2016-02-01 LAB — HEPATIC FUNCTION PANEL
ALT: 33 U/L (ref 7–35)
AST: 54 U/L — AB (ref 13–35)
Alkaline Phosphatase: 433 U/L — AB (ref 25–125)
BILIRUBIN, TOTAL: 0.5 mg/dL

## 2016-02-01 LAB — BASIC METABOLIC PANEL
BUN: 21 mg/dL (ref 4–21)
Creatinine: 1.1 mg/dL (ref 0.5–1.1)
GLUCOSE: 101 mg/dL
Potassium: 4.7 mmol/L (ref 3.4–5.3)
Sodium: 136 mmol/L — AB (ref 137–147)

## 2016-02-01 LAB — CBC AND DIFFERENTIAL
HEMATOCRIT: 36 % (ref 36–46)
HEMOGLOBIN: 11.5 g/dL — AB (ref 12.0–16.0)
Neutrophils Absolute: 5 /uL
PLATELETS: 300 10*3/uL (ref 150–399)
WBC: 7.9 10*3/mL

## 2016-02-02 ENCOUNTER — Other Ambulatory Visit: Payer: Self-pay | Admitting: *Deleted

## 2016-02-02 MED ORDER — HYDROCODONE-ACETAMINOPHEN 5-325 MG PO TABS: ORAL_TABLET | ORAL | 0 refills | Status: DC

## 2016-02-02 NOTE — Telephone Encounter (Signed)
[facility] #[phone] Fax: [phone] 

## 2016-02-07 ENCOUNTER — Ambulatory Visit: Payer: Medicare Other | Admitting: Cardiology

## 2016-02-12 ENCOUNTER — Encounter: Payer: Self-pay | Admitting: Adult Health

## 2016-02-12 ENCOUNTER — Other Ambulatory Visit: Payer: Self-pay | Admitting: *Deleted

## 2016-02-12 ENCOUNTER — Non-Acute Institutional Stay (SKILLED_NURSING_FACILITY): Payer: Medicare Other | Admitting: Adult Health

## 2016-02-12 DIAGNOSIS — E876 Hypokalemia: Secondary | ICD-10-CM | POA: Diagnosis not present

## 2016-02-12 DIAGNOSIS — S329XXS Fracture of unspecified parts of lumbosacral spine and pelvis, sequela: Secondary | ICD-10-CM

## 2016-02-12 DIAGNOSIS — I952 Hypotension due to drugs: Secondary | ICD-10-CM

## 2016-02-12 MED ORDER — HYDROCODONE-ACETAMINOPHEN 5-325 MG PO TABS: ORAL_TABLET | ORAL | 0 refills | Status: DC

## 2016-02-12 NOTE — Progress Notes (Signed)
Patient ID: Equilla Que, female   DOB: [birth year], 81 y.o.   MRN: [id number]    DATE:  02/12/16  MRN:  [id number]  BIRTHDAY: February 11, 1935  Facility:  Nursing Home Location:  [facility]  Nursing Home Room Number: 701-P  LEVEL OF CARE:  SNF (31)  Contact Information    Name Relation Home Work Mobile   Hagman,Timothy D. Son [phone]  [phone]   Wright,Valie-Foos Relative [phone]  [phone]       Code Status History    Date Active Date Inactive Code Status Order ID Comments User Context   01/27/2016 10:24 AM 01/29/2016  8:07 PM DNR [id number]  Albertine Grates, MD Inpatient   01/27/2016  2:26 AM 01/27/2016 10:24 AM Full Code [id number]  Hillary Bow, DO Inpatient   10/06/2015  4:49 PM 10/16/2015 12:28 PM Full Code [id number]  Leone Brand, NP Inpatient   02/25/2015  8:32 PM 02/28/2015 12:29 PM Full Code [id number]  Marlon Pel, PA-C ED   07/09/2013 11:21 AM 07/09/2013  8:06 PM Full Code [id number]  Joya Gaskins, MD ED    Questions for Most Recent Historical Code Status (Order [id number])    Question Answer Comment   In the event of cardiac or respiratory ARREST Do not call a "code blue"    In the event of cardiac or respiratory ARREST Do not perform Intubation, CPR, defibrillation or ACLS    In the event of cardiac or respiratory ARREST Use medication by any route, position, wound care, and other measures to relive pain and suffering. May use oxygen, suction and manual treatment of airway obstruction as needed for comfort.         Advance Directive Documentation   Flowsheet Row Most Recent Value  Type of Advance Directive  Out of facility DNR (pink MOST or yellow form)  Pre-existing out of facility DNR order (yellow form or pink MOST form)  No data  "MOST" Form in Place?  No data       Chief Complaint  Patient presents with  . Acute Visit    Hypotension, hypokalemia, pain management    HISTORY OF PRESENT ILLNESS:  This is a 81 year old female  who is currently having a short-term rehabilitation @ [address] She was noted to have hypotension - BPs 112/68, 96/60, 103/75. Patient would complain of dizziness whenever her BP is low. She currently takes Metoprolol 25 mg BID. Labs were reviewed. Noted K 4.7, normal. She currently takes KCL supplementation. She is not on any diuretics. She takes Norco routinely and PRN.  She has been admitted to [facility] on 01/29/16 from [facility].  She has PMH of CAD S/P CABG. She had a fall and sustained a pelvic fracture and right wrist fracture. Orthopedic consulted and non-operative management was decided upon.   PAST MEDICAL HISTORY:  Past Medical History:  Diagnosis Date  . AAA (abdominal aortic aneurysm) (HCC)   . Abdominal aneurysm (HCC)    CTA 10/23/2015  . Anxiety   . Atrial fibrillation, new onset (HCC) 10/2015   after CABG, in SR at d/c  . CAD (coronary artery disease), native coronary artery 10/08/2015  . Congestive dilated cardiomyopathy (HCC) 10/08/2015  . COPD (chronic obstructive pulmonary disease) (HCC)   . Depression   . Hyperlipidemia LDL goal <70 10/08/2015  . IBS (irritable bowel syndrome)   . Oral thrush   . Post PTCA   . TIA (transient ischemic attack)      CURRENT MEDICATIONS: Reviewed  Patient's Medications  New Prescriptions   No medications on file  Previous Medications   ALPRAZOLAM (XANAX) 0.25 MG TABLET    Take 1 tablet (0.25 mg total) by mouth 2 (two) times daily as needed for anxiety.   ASPIRIN 325 MG TABLET    Take 325 mg by mouth daily.   ATORVASTATIN (LIPITOR) 40 MG TABLET    Take 1 tablet (40 mg total) by mouth daily at 6 PM.   DOCUSATE SODIUM (COLACE) 100 MG CAPSULE    Take 100 mg by mouth 2 (two) times daily.   FEXOFENADINE (ALLEGRA) 180 MG TABLET    Take 180 mg by mouth daily as needed for allergies or rhinitis.   POLYETHYLENE GLYCOL (MIRALAX / GLYCOLAX) PACKET    Take 17 g by mouth daily.   SERTRALINE (ZOLOFT) 50 MG TABLET    Take 50 mg by  mouth every morning.   Modified Medications   Modified Medication Previous Medication   HYDROCODONE-ACETAMINOPHEN (NORCO/VICODIN) 5-325 MG TABLET HYDROcodone-acetaminophen (NORCO/VICODIN) 5-325 MG tablet      Take one tablet by mouth every 4 hours as needed for pain. Do not exceed 4gm of Tylenol in 24 hours    Take one tablet by mouth every 4 hours as needed for pain. Do not exceed 4gm of Tylenol in 24 hours  Discontinued Medications   HYDROCODONE-ACETAMINOPHEN (NORCO/VICODIN) 5-325 MG TABLET    Take 1-2 tablets by mouth every 6 (six) hours as needed for moderate pain.   HYDROCODONE-ACETAMINOPHEN (NORCO/VICODIN) 5-325 MG TABLET    Take one tablet by mouth every night at bedtime. Do not exceed 4gm of Tylenol in 24 hours   METOPROLOL TARTRATE (LOPRESSOR) 25 MG TABLET    Take 1 tablet (25 mg total) by mouth 2 (two) times daily.   POTASSIUM CHLORIDE (K-DUR,KLOR-CON) 10 MEQ TABLET    Take 1 tablet (10 mEq total) by mouth every Monday, Wednesday, and Friday.     No Known Allergies   REVIEW OF SYSTEMS:  GENERAL: no change in appetite, no fatigue, no weight changes, no fever, chills or weakness EYES: Denies change in vision, dry eyes, eye pain, itching or discharge EARS: Denies change in hearing, ringing in ears, or earache NOSE: Denies nasal congestion or epistaxis MOUTH and THROAT: Denies oral discomfort, gingival pain or bleeding, pain from teeth or hoarseness   RESPIRATORY: no cough, SOB, DOE, wheezing, hemoptysis CARDIAC: no chest pain, edema or palpitations GI: no abdominal pain, diarrhea, constipation, heart burn, nausea or vomiting GU: Denies dysuria, frequency, hematuria, incontinence, or discharge PSYCHIATRIC: Denies feeling of depression or anxiety. No report of hallucinations, insomnia, paranoia, or agitation    PHYSICAL EXAMINATION  GENERAL APPEARANCE: Well nourished. Normal body habitus SKIN:  Skin is warm and dry.  HEAD: Normal in size and contour. No evidence of  trauma EYES: Lids open and close normally. No blepharitis, entropion or ectropion. PERRL. Conjunctivae are clear and sclerae are white. Lenses are without opacity EARS: Pinnae are normal. Patient hears normal voice tunes of the examiner MOUTH and THROAT: Lips are without lesions. Oral mucosa is moist and without lesions. Tongue is normal in shape, size, and color and without lesions NECK: supple, trachea midline, no neck masses, no thyroid tenderness, no thyromegaly LYMPHATICS: no LAN in the neck, no supraclavicular LAN RESPIRATORY: breathing is even & unlabored, BS CTAB CARDIAC: RRR, no murmur,no extra heart sounds, no edema GI: abdomen soft, normal BS, no masses, no tenderness, no hepatomegaly, no splenomegaly EXTREMITIES:  Able to move X 4 extremities,  right forearm has splint PSYCHIATRIC: Alert and oriented X 3. Affect and behavior are appropriate  LABS/RADIOLOGY: Labs reviewed: Basic Metabolic Panel:  Recent Labs  16/10/96 0426 10/17/15 1605  01/27/16 0839 01/28/16 0235 01/29/16 0408 02/01/16  NA 138  --   < > 132* 133* 134* 136*  K 4.6  --   < > 4.3 4.4 4.1 4.7  CL 109  --   < > 95* 102 104  --   CO2 20*  --   < > 26 21* 20*  --   GLUCOSE 135*  --   < > 103* 93 109*  --   BUN 10  --   < > 18 15 13 21   CREATININE 1.18* 1.60*  < > 1.24* 1.13* 0.90 1.1  CALCIUM 8.2*  --   < > 9.7 9.2 9.2  --   MG 2.4 2.2  --   --  1.9  --   --   < > = values in this interval not displayed.  Liver Function Tests:  Recent Labs  02/25/15 1850 10/06/15 1506 02/01/16  AST 29 30 54*  ALT 6* 14 33  ALKPHOS 49 63 433*  BILITOT 0.9 0.9  --   PROT 6.1* 7.0  --   ALBUMIN 3.4* 3.6  --    CBC:  Recent Labs  10/06/15 1506  01/26/16 2240 01/28/16 0235 01/29/16 0408 02/01/16  WBC 10.3  < > 11.1* 7.6 7.9 7.9  NEUTROABS 7.4  --  8.4*  --   --  5  HGB 11.9*  < > 10.8* 10.7* 10.9* 11.5*  HCT 36.3  < > 32.2* 33.7* 34.5* 36  MCV 94.0  < > 89.9 91.6 91.5  --   PLT 330  < > 300 304 300 300  <  > = values in this interval not displayed.  Lipid Panel:  Recent Labs  10/06/15 1506 10/07/15 0326  HDL 107 85   Cardiac Enzymes:  Recent Labs  10/07/15 0326 10/07/15 0924 10/07/15 1619  TROPONINI 3.22* 3.27* 2.67*   CBG:  Recent Labs  10/21/15 1705 10/22/15 0559 10/22/15 1358  GLUCAP 124* 97 133*      Dg Elbow Complete Right (3+view)  Result Date: 01/26/2016 CLINICAL DATA:  Right elbow pain with laceration to the olecranon area after a fall today. EXAM: RIGHT ELBOW - COMPLETE 3+ VIEW COMPARISON:  None. FINDINGS: There is no evidence of fracture, dislocation, or joint effusion. There is no evidence of arthropathy or other focal bone abnormality. Soft tissues are unremarkable. IMPRESSION: Negative. Electronically Signed   By: Burman Nieves M.D.   On: 01/26/2016 21:41   Dg Wrist Complete Right  Result Date: 01/26/2016 CLINICAL DATA:  Right wrist pain after a fall today. EXAM: RIGHT WRIST - COMPLETE 3+ VIEW COMPARISON:  02/25/2015 FINDINGS: Degenerative changes in the radiocarpal and STT joints. No fractures demonstrated in the carpal bones. Transverse fractures demonstrated in the distal right second and fourth metacarpal bones. Vague oblique linear lucency in the proximal second metacarpal bone could also represent nondisplaced fracture although this is not confirmed on all views. IMPRESSION: Degenerative changes in the right wrist. Fractures demonstrated in the right second and fourth metacarpal bones at the distal shaft. Possible nondisplaced fracture of the proximal second metacarpal bone. Carpal bones appear intact. Electronically Signed   By: Burman Nieves M.D.   On: 01/26/2016 21:47   Ct Head Wo Contrast  Result Date: 01/26/2016 CLINICAL DATA:  Status post fall to right side  with head injury. EXAM: CT HEAD WITHOUT CONTRAST CT CERVICAL SPINE WITHOUT CONTRAST TECHNIQUE: Multidetector CT imaging of the head and cervical spine was performed following the standard  protocol without intravenous contrast. Multiplanar CT image reconstructions of the cervical spine were also generated. COMPARISON:  CT head and cervical spine 08/17/2014. FINDINGS: CT HEAD FINDINGS Brain: There has been interval development of lucencies within left lentiform nucleus and right posterior limb of internal capsule consistent with age-indeterminate lacunar infarcts. Additionally there is a chronic appearing infarct in the right cerebellar hemisphere that is new. Stable background of moderate chronic microvascular ischemic changes and mild parenchymal volume loss. No evidence of large acute infarct, hemorrhage, hydrocephalus, extra-axial collection, for focal mass effect. Vascular: No hyperdense vessel. Calcific atherosclerosis carotid siphons. Skull: Normal. Negative for fracture or focal lesion. Sinuses/Orbits: No acute finding. Other: None. CT CERVICAL SPINE FINDINGS Alignment: Normal cervical lordosis. Minimal stable C7-T1 anterolisthesis. Anterior cervical discectomy and fusion of C5 through C7. Hardware appears intact. Skull base and vertebrae: No acute fracture. No primary bone lesion or focal pathologic process. Soft tissues and spinal canal: No lymphadenopathy or discrete cervical mass identified on this noncontrast study. The aerodigestive tract is patent. Normal thyroid gland. Dense calcifications of the carotid bifurcations bilaterally. No prevertebral fluid or swelling. No visible canal hematoma.- Disc levels: Degenerative changes of the cervical spine mild multilevel facet arthropathy and discogenic changes with marginal osteophytes and disc space narrowing most pronounced at C7-T1. Disc bulges at the C3-4 and C4-5 levels. Upper chest: Scarring with calcifications in the lung apices. Other: None. IMPRESSION: 1. No skull fracture or intracranial hemorrhage identified. 2. Interval age-indeterminate lacunar infarcts in the basal ganglia bilaterally from 2016. 3. Interval chronic appearing small  right occipital lobe infarct. 4. Stable background of moderate chronic microvascular ischemic changes and mild parenchymal volume loss. 5. No acute fracture or dislocation of the cervical spine. 6. Multilevel cervical degenerative changes and intact anterior cervical discectomy and fusion hardware are stable. Electronically Signed   By: Mitzi Hansen M.D.   On: 01/26/2016 22:09   Ct Cervical Spine Wo Contrast  Result Date: 01/26/2016 CLINICAL DATA:  Status post fall to right side with head injury. EXAM: CT HEAD WITHOUT CONTRAST CT CERVICAL SPINE WITHOUT CONTRAST TECHNIQUE: Multidetector CT imaging of the head and cervical spine was performed following the standard protocol without intravenous contrast. Multiplanar CT image reconstructions of the cervical spine were also generated. COMPARISON:  CT head and cervical spine 08/17/2014. FINDINGS: CT HEAD FINDINGS Brain: There has been interval development of lucencies within left lentiform nucleus and right posterior limb of internal capsule consistent with age-indeterminate lacunar infarcts. Additionally there is a chronic appearing infarct in the right cerebellar hemisphere that is new. Stable background of moderate chronic microvascular ischemic changes and mild parenchymal volume loss. No evidence of large acute infarct, hemorrhage, hydrocephalus, extra-axial collection, for focal mass effect. Vascular: No hyperdense vessel. Calcific atherosclerosis carotid siphons. Skull: Normal. Negative for fracture or focal lesion. Sinuses/Orbits: No acute finding. Other: None. CT CERVICAL SPINE FINDINGS Alignment: Normal cervical lordosis. Minimal stable C7-T1 anterolisthesis. Anterior cervical discectomy and fusion of C5 through C7. Hardware appears intact. Skull base and vertebrae: No acute fracture. No primary bone lesion or focal pathologic process. Soft tissues and spinal canal: No lymphadenopathy or discrete cervical mass identified on this noncontrast  study. The aerodigestive tract is patent. Normal thyroid gland. Dense calcifications of the carotid bifurcations bilaterally. No prevertebral fluid or swelling. No visible canal hematoma.- Disc levels: Degenerative changes of  the cervical spine mild multilevel facet arthropathy and discogenic changes with marginal osteophytes and disc space narrowing most pronounced at C7-T1. Disc bulges at the C3-4 and C4-5 levels. Upper chest: Scarring with calcifications in the lung apices. Other: None. IMPRESSION: 1. No skull fracture or intracranial hemorrhage identified. 2. Interval age-indeterminate lacunar infarcts in the basal ganglia bilaterally from 2016. 3. Interval chronic appearing small right occipital lobe infarct. 4. Stable background of moderate chronic microvascular ischemic changes and mild parenchymal volume loss. 5. No acute fracture or dislocation of the cervical spine. 6. Multilevel cervical degenerative changes and intact anterior cervical discectomy and fusion hardware are stable. Electronically Signed   By: Mitzi Hansen M.D.   On: 01/26/2016 22:09   Ct Pelvis Wo Contrast  Result Date: 01/26/2016 CLINICAL DATA:  Unwitnessed fall 1.5 hours ago. Right-sided body pain. EXAM: CT PELVIS WITHOUT CONTRAST TECHNIQUE: Multidetector CT imaging of the pelvis was performed following the standard protocol without intravenous contrast. Bone algorithm images were obtained for evaluation of pelvis and hips. COMPARISON:  Right hip 01/26/2016. CT abdomen and pelvis 10/23/2015. FINDINGS: Urinary Tract: Technically limited evaluation of the urinary tract due to bone algorithm. Bladder is not abnormally distended and no wall thickening or filling defect noted. Distal ureters are dilated. Bowel: Technically limited evaluation due bone algorithm. Visualize pelvic portion of the colon and small bowel are not abnormally distended. Stool in the colon. Vascular/Lymphatic: Technically limited evaluation due to bone  algorithm. No significant lymphadenopathy demonstrated. There is an aneurysm of the abdominal aorta, incompletely included within the field of view, visualized portion measuring 4 cm maximal AP diameter. Peripheral calcification. The aneurysm was present on the previous examination, measuring 4.8 cm maximal diameter. Comparison between the 2 studies is not possible because the entire aortic aneurysm is not included within current study. Reproductive: Technically limited due to bone algorithm. Uterus appears to be surgically absent. No focal pelvic mass lesions. Other: No evidence of significant muscular hematoma or infiltration. Musculoskeletal: There is a nondisplaced mildly comminuted fracture of the superior pubic ramus on the right. Nondisplaced fracture of the left superior pubic ramus adjacent to the symphysis pubis. No evidence of pubic diastases. Fractures do not involve the symphysis pubis. Inferior pubic rami, acetabuli, pelvis, and hips are otherwise unremarkable. No additional fractures are demonstrated. SI joints are not displaced. Postoperative changes in the lower lumbar spine with lower lumbar laminectomies. IMPRESSION: Nondisplaced fractures of the right and left superior pubic rami anteriorly. No additional acute fractures demonstrated. Incidental note of a abdominal aortic aneurysm, incompletely included within the field of view. This has been present on previous studies. Comparison is not reliable due to incomplete inclusion of the aortic aneurysm on today's study. Electronically Signed   By: Burman Nieves M.D.   On: 01/26/2016 21:59   Dg Hand Complete Right  Result Date: 01/26/2016 CLINICAL DATA:  Pain, contusion, and decreased range of motion of the right hand after a fall today. EXAM: RIGHT HAND - COMPLETE 3+ VIEW COMPARISON:  Right wrist 02/25/2015 FINDINGS: There is an acute transverse mildly comminuted fracture of the distal shaft of the right second finger with mild volar  angulation. Small focal cortical irregularity and linear lucency in the distal shaft of the fourth metacarpal bone suggesting nondisplaced fracture no other acute fractures identified. Degenerative changes in the interphalangeal joints. Degenerative changes in the STT joints. Soft tissue swelling. IMPRESSION: Transverse mildly comminuted fracture of the distal shaft right second metacarpal bone. Nondisplaced fracture of the distal shaft right fourth  metacarpal bone. Electronically Signed   By: Burman NievesWilliam  Stevens M.D.   On: 01/26/2016 21:40   Dg Hip Unilat W Or Wo Pelvis 2-3 Views Right  Result Date: 01/26/2016 CLINICAL DATA:  Pain following fall EXAM: DG HIP (WITH OR WITHOUT PELVIS) 2-3V RIGHT COMPARISON:  None. FINDINGS: Frontal pelvis as well as frontal and lateral right hip images were obtained. There is a small avulsion arising from the dorsal aspect of the medial superior pubic ramus on the right. No other evidence of fracture. No dislocation. Bones are osteoporotic. There is mild symmetric narrowing of both hip joints. No erosive change. There is postoperative change at L5. There are foci of arterial vascular calcification in the common and external iliac arteries bilaterally. IMPRESSION: Small avulsion along the dorsal aspect of the medial portion of the right superior pubic ramus. No other evidence of fracture. No dislocation. Mild symmetric narrowing both hip joints. Bones appear osteoporotic. There is arterial vascular atherosclerosis. Electronically Signed   By: Bretta BangWilliam  Woodruff III M.D.   On: 01/26/2016 21:43    ASSESSMENT/PLAN:  Hypotension - decrease Metoprolol tartrate 25 mg  to 1/2 tab = 12.5 mg PO BID, hold for SBP <100; check orthostatic BP BID X 1 week; Ted stockings, bilateral, on in AM and off @ HS  Hypokalemia - discontinue  KCL ER 10 meq 1 tab PO Q M-W-F; check BMP  Lab Results  Component Value Date   K 4.7 02/01/2016   Pelvic and wrist fracture - patient verbalized that her  pain is well-controlled; discontinue Norco 5/325 mg 2 tabs PO Q 9AM, 2PM and 9PM; start Norco 5/325 mg 1 tab PO Q 4 hours PRN      Kenard GowerMonina Medina-Vargas, NP [facility] [phone]

## 2016-02-12 NOTE — Telephone Encounter (Signed)
[facility] #[phone] Fax: [phone] 

## 2016-02-15 ENCOUNTER — Other Ambulatory Visit: Payer: Self-pay

## 2016-02-15 MED ORDER — HYDROCODONE-ACETAMINOPHEN 5-325 MG PO TABS: ORAL_TABLET | ORAL | 0 refills | Status: DC

## 2016-02-15 NOTE — Telephone Encounter (Signed)
Rx faxed to [facility] @ [phone], phone number [phone]  

## 2016-02-19 ENCOUNTER — Inpatient Hospital Stay (INDEPENDENT_AMBULATORY_CARE_PROVIDER_SITE_OTHER): Payer: Medicare Other | Admitting: Orthopaedic Surgery

## 2016-02-20 ENCOUNTER — Encounter: Payer: Self-pay | Admitting: Cardiology

## 2016-02-20 ENCOUNTER — Non-Acute Institutional Stay (SKILLED_NURSING_FACILITY): Payer: Medicare Other | Admitting: Adult Health

## 2016-02-20 ENCOUNTER — Ambulatory Visit (INDEPENDENT_AMBULATORY_CARE_PROVIDER_SITE_OTHER): Payer: Medicare Other | Admitting: Cardiology

## 2016-02-20 ENCOUNTER — Encounter: Payer: Self-pay | Admitting: Adult Health

## 2016-02-20 VITALS — BP 104/70 | HR 87 | Ht 61.0 in | Wt 97.0 lb

## 2016-02-20 DIAGNOSIS — N183 Chronic kidney disease, stage 3 unspecified: Secondary | ICD-10-CM

## 2016-02-20 DIAGNOSIS — I2102 ST elevation (STEMI) myocardial infarction involving left anterior descending coronary artery: Secondary | ICD-10-CM

## 2016-02-20 DIAGNOSIS — S329XXS Fracture of unspecified parts of lumbosacral spine and pelvis, sequela: Secondary | ICD-10-CM

## 2016-02-20 DIAGNOSIS — I420 Dilated cardiomyopathy: Secondary | ICD-10-CM

## 2016-02-20 DIAGNOSIS — R2681 Unsteadiness on feet: Secondary | ICD-10-CM

## 2016-02-20 DIAGNOSIS — I4891 Unspecified atrial fibrillation: Secondary | ICD-10-CM

## 2016-02-20 DIAGNOSIS — E785 Hyperlipidemia, unspecified: Secondary | ICD-10-CM

## 2016-02-20 DIAGNOSIS — F419 Anxiety disorder, unspecified: Secondary | ICD-10-CM

## 2016-02-20 DIAGNOSIS — I9789 Other postprocedural complications and disorders of the circulatory system, not elsewhere classified: Secondary | ICD-10-CM

## 2016-02-20 DIAGNOSIS — I714 Abdominal aortic aneurysm, without rupture, unspecified: Secondary | ICD-10-CM

## 2016-02-20 DIAGNOSIS — I952 Hypotension due to drugs: Secondary | ICD-10-CM | POA: Diagnosis not present

## 2016-02-20 DIAGNOSIS — S62101S Fracture of unspecified carpal bone, right wrist, sequela: Secondary | ICD-10-CM | POA: Diagnosis not present

## 2016-02-20 DIAGNOSIS — J309 Allergic rhinitis, unspecified: Secondary | ICD-10-CM

## 2016-02-20 DIAGNOSIS — I951 Orthostatic hypotension: Secondary | ICD-10-CM

## 2016-02-20 DIAGNOSIS — K5901 Slow transit constipation: Secondary | ICD-10-CM

## 2016-02-20 DIAGNOSIS — I2510 Atherosclerotic heart disease of native coronary artery without angina pectoris: Secondary | ICD-10-CM

## 2016-02-20 DIAGNOSIS — E43 Unspecified severe protein-calorie malnutrition: Secondary | ICD-10-CM | POA: Diagnosis not present

## 2016-02-20 DIAGNOSIS — R42 Dizziness and giddiness: Secondary | ICD-10-CM

## 2016-02-20 DIAGNOSIS — I25119 Atherosclerotic heart disease of native coronary artery with unspecified angina pectoris: Secondary | ICD-10-CM

## 2016-02-20 DIAGNOSIS — I2109 ST elevation (STEMI) myocardial infarction involving other coronary artery of anterior wall: Secondary | ICD-10-CM

## 2016-02-20 DIAGNOSIS — F32A Depression, unspecified: Secondary | ICD-10-CM

## 2016-02-20 DIAGNOSIS — I209 Angina pectoris, unspecified: Secondary | ICD-10-CM

## 2016-02-20 DIAGNOSIS — F329 Major depressive disorder, single episode, unspecified: Secondary | ICD-10-CM

## 2016-02-20 NOTE — Progress Notes (Signed)
PCP: Rocky Morel, MD  Clinic Note: Chief Complaint  Patient presents with  . Follow-up    3 MONTHS  . Dizziness  . Shortness of Breath    HPI: Tonya Payne is a 81 y.o. female with a PMH below who presents today for 3 month follow-up for CAD-CABG after a STEMI - really NSTEMI. She has a history of hyperlipidemia, AAA, COPD TIA IBS and anxiety along with tobacco abuse. D/c 06/26 after admit for STEMI>>abnl cath>>CABG w/ LIMA-LAD, SVG-D1, SVG-RCA, EF 20-25% by cath 06/02, normal by echo 06/05. She had post-op afib but was in sinus rhythm at discharge.  Tonya Payne was last seen by Theodore Demark on 11/09/2015. This was her post hospital follow-up after CABG. Denied any chest pain of the muscle skeletal pain. Gradually trended build back up her weight for appetite. Also been troubled by constipation. Cardiac Rehab recommended --> since then, she suffered a fall & fractured Pelvis.    Recent Hospitalizations:   September 22-25th suffered mechanical fall and lost balance and injured her wrist and pelvis. bilateral- Suprapubic Rami fractures as well as a wrist fracture.   Studies Reviewed:   LHC, PTCA  10/06/2015  Mid LAD lesion, 95% stenosed. Post intervention, there is a 70% residual stenosis. Heavily calcified  LM lesion, 75% stenosed. Heavily calcified. Ost-proximal LAD lesion, 75% stenosed. Heavily calcified  Prox RCA to Mid RCA lesion, 50% stenosed. Heavily calcified  There is severe left ventricular systolic dysfunction. EF 20-25% with essentially apical akinesis and distal anterior hypokinesis.   Severe ischemic cardiomyopathy with EF of 20-25% and extensive disease in the distal left main and 2 areas in the proximal and mid LAD. All lesions are extensively calcified and would not likely allow for appropriate stent expansion without rotational atherectomy. --> Ultimately referred for CABG. Diagnostic Diagram      Post-Intervention Diagram         CABG x 3 (Dr.  [name]) 10/16/2015: LIMA-LAD, SVG-D1, SVG-RCA  Intra-Op TEE 10/16/2015: EF 40-45%.  Interval History: Tonya Payne presents today in a wheelchair very frail appearing.  She has limited mobility still following her pelvic fracture & complaints of frequent dizziness & lack of energy.  She really doesn't have any stability to get up and do any particular exercise. She just feels totally fatigued and drained. Today she feels dizzy sometimes she feels like she has a sensation of nausea but no vomiting. She just is not eating well.  She denies any anginal chest tightness or pressure or pain. With the amount of activity she is doing, she denies any dyspnea. No PND, orthopnea or edema. No palpitations, lightheadedness, dizziness, weakness or syncope/near syncope. No TIA/amaurosis fugax symptoms. No claudication.  ROS: A comprehensive was performed. Review of Systems  Constitutional: Positive for malaise/fatigue (No energy) and weight loss (Not eating well).  HENT: Negative for nosebleeds.   Respiratory: Negative for cough, shortness of breath and wheezing.   Cardiovascular:       Per history of present illness  Gastrointestinal: Positive for nausea (With dizziness). Negative for blood in stool and constipation.       Poor by mouth intake  Genitourinary: Negative for hematuria.  Musculoskeletal: Negative for joint pain.  Neurological: Positive for dizziness (Almost continuously). Negative for headaches.  Psychiatric/Behavioral: Positive for depression. Negative for memory loss. The patient is nervous/anxious. The patient does not have insomnia.        I can tell if somewhat she is suffering his postop depression. She just really seems down  and depressed mood.  All other systems reviewed and are negative.   Past Medical History:  Diagnosis Date  . AAA (abdominal aortic aneurysm) without rupture (HCC) 2016   CTA 10/23/2015 - saccular infrarenal aneurysm roughly 4.8 cm in diameter. Stable from 2016  -- Dr. Darrick PennaFields  . Anxiety   . Atrial fibrillation, new onset (HCC) 10/2015   after CABG, in SR at d/c  . CAD (coronary artery disease), native coronary artery 10/08/2015   Severe coronary disease with calcified left main and LAD: 5% left main going into ostial LAD. Mid LAD 95% (treated with PTCA). Proximal RCA 50%, calcified. EF was 20-25% with apical akinesis and distal anterior hypokinesis. --> Referred for CABG  . Congestive dilated cardiomyopathy (HCC) 10/08/2015   Intra-Op TEE showed EF of 40 and 45%. She had an echo with a "normal LV function "documented, but there was no reading M.D.  . COPD (chronic obstructive pulmonary disease) (HCC)   . Depression   . History of acute anterior wall MI 10/08/2015   Presented with severe chest pain and dynamic anterior ST elevations/biphasic elevations area did not meet full criteria for STEMI, and troponin was not dramatically elevated. 95% LAD treated with PTCA followed by CABG  . Hyperlipidemia LDL goal <70 10/08/2015  . IBS (irritable bowel syndrome)   . Oral thrush   . Post PTCA 10/08/2015   Presented with STEMI, severe mid LAD lesion treated with PTCA and then referred for CABG.  . TIA (transient ischemic attack)     Past Surgical History:  Procedure Laterality Date  . BACK SURGERY    . CARDIAC CATHETERIZATION N/A 10/06/2015   Procedure: Left Heart Cath and Coronary Angiography;  Surgeon: Marykay Lexavid W Harding, MD;  [facility];  Service: Cardiovascular;: Heavily calcified left main-LAD. 75% LM & Ost LAD, 95% d-mLAD, p-mRCA Calcified 50%. EF ~20-25%  . CARDIAC CATHETERIZATION N/A 10/06/2015   Procedure: Coronary Balloon Angioplasty;  Surgeon: Marykay Lexavid W Harding, MD;  [facility];  Service: Cardiovascular: PTCA of 95% LAD in setting of STEMI - reduced to 70% --> would need Atherectomy-PCI vs. CABG.  Sent for CABG.  . COLECTOMY    . CORONARY ARTERY BYPASS GRAFT N/A 10/16/2015   Procedure: CORONARY ARTERY BYPASS GRAFTING  (CABG)  x three, using left internal mammary artery and right leg greater saphenous vein harvested endoscopically;  Surgeon: Loreli SlotSteven C Hendrickson, MD;  [facility];  Service: Open Heart Surgery;  Laterality: N/A;  . NECK SURGERY    . TEE WITHOUT CARDIOVERSION N/A 10/16/2015   Procedure: TRANSESOPHAGEAL ECHOCARDIOGRAM (TEE);  Surgeon: Loreli SlotSteven C Hendrickson, MD;  [facility];  Service: Open Heart Surgery - IntraOp:  EF 40-45%.   Prior to Admission medications   Medication Sig Start Date End Date Taking? Authorizing Provider  ALPRAZolam (XANAX) 0.25 MG tablet Take 1 tablet (0.25 mg total) by mouth 2 (two) times daily as needed for anxiety. 01/29/16  Yes Albertine GratesFang Xu, MD  aspirin 325 MG tablet Take 325 mg by mouth daily.   Yes Historical Provider, MD  atorvastatin (LIPITOR) 40 MG tablet Take 1 tablet (40 mg total) by mouth daily at 6 PM. 12/18/15  Yes Erin R Barrett, PA-C  docusate sodium (COLACE) 100 MG capsule Take 100 mg by mouth 2 (two) times daily.   Yes Historical Provider, MD  fexofenadine (ALLEGRA) 180 MG tablet Take 180 mg by mouth daily as needed for allergies or rhinitis.   Yes Historical Provider, MD  HYDROcodone-acetaminophen (  NORCO/VICODIN) 5-325 MG tablet Take one tablet by mouth every 4 hours as needed for pain. Do not exceed 4gm of Tylenol in 24 hours 02/15/16  Yes Tiffany L Reed, DO  polyethylene glycol (MIRALAX / GLYCOLAX) packet Take 17 g by mouth daily.   Yes Historical Provider, MD  sertraline (ZOLOFT) 50 MG tablet Take 50 mg by mouth every morning.    Yes Historical Provider, MD  * Metoprolol Tartrate 12.5 mg qhs -- stopped today.  No Known Allergies   Social History   Social History  . Marital status: Widowed    Spouse name: N/A  . Number of children: N/A  . Years of education: N/A   Social History Main Topics  . Smoking status: Former Smoker    Packs/day: 0.50    Types: Cigarettes    Quit date: 04/11/2015  . Smokeless tobacco: Never Used  . Alcohol use 1.2 oz/week     2 Glasses of wine per week  . Drug use: No  . Sexual activity: No   Other Topics Concern  . None   Social History Narrative  . None    Family History  Problem Relation Age of Onset  . Stroke Mother     dead  . Clotting disorder Father     dead  bone marrow disease    Wt Readings from Last 3 Encounters:  02/20/16 44 kg (97 lb)  02/20/16 46 kg (101 lb 6.4 oz)  02/12/16 46 kg (101 lb 8 oz)    PHYSICAL EXAM BP 104/70   Pulse 87   Ht 5\' 1"  (1.549 m)   Wt 44 kg (97 lb)   BMI 18.33 kg/m  General appearance: Thin, frail elderly woman who appears older than her stated age. She is in a wheelchair and seems very debilitated. She has temporal wasting signs of cachexia. Neck: no adenopathy, no carotid bruit and no JVD Lungs: clear to auscultation bilaterally, normal percussion bilaterally and non-labored Heart: regular rate and rhythm, S1 & S2 normal, no murmur, click, rub or gallop; non-displaced PMI. Abdomen: soft, non-tender; bowel sounds normal; no masses,  no organomegaly; No HHJR Extremities: extremities normal, atraumatic, no cyanosis, or edema  Pulses: 2+ and symmetric;  Skin: mobility and turgor normal, no evidence of bleeding or bruising and no lesions noted Neurologic: Mental status: Alert, oriented, thought content appropriate Cranial nerves: normal (II-XII grossly intact)    Adult ECG Report Not checked.   Other studies Reviewed: Additional studies/ records that were reviewed today include:  Recent Labs:   Lab Results  Component Value Date   CHOL 200 10/07/2015   HDL 85 10/07/2015   LDLCALC 93 10/07/2015   TRIG 112 10/07/2015   CHOLHDL 2.4 10/07/2015   Lab Results  Component Value Date   CREATININE 1.1 02/01/2016   Lab Results  Component Value Date   LABPROT 18.2 (H) 10/16/2015    ASSESSMENT / PLAN: Problem List Items Addressed This Visit    Protein-calorie malnutrition, severe (HCC) (Chronic)    Really not 8 Drinking well. Apparently she  was not able to get ensure at the skilled nursing facility. I strongly recommend that she has a protein shake supplementation for each meal of the day as well as at least 1 not to supplement Terry shakes during the course of the day. She needs to maintain adequate hydration as well to avoid constipation. She still needs to try to eat real food as well. This is definitely contributing to her dizziness. It is also  diminish her ability to recover from her operation and now pelvic fracture.      Postoperative atrial fibrillation (HCC)    Was sinus rhythm on discharge. Has not had any symptoms to suggest A. fib. Will need to monitor.  This patients CHA2DS2-VASc Score and unadjusted Ischemic Stroke Rate (% per year) is equal to 7.2 % stroke rate/year from a score of 5 Above score calculated as 1 point each if present [CHF, HTN, DM, Vascular=MI/PAD/Aortic Plaque, Age if 77-74, or Female]; Above score calculated as 2 points each if present [Age > 75, or Stroke/TIA/TE]  - For now, I would not anticoagulate unless she proves to have recurrence. Somewhat concerned with her instability that she may fall and have a bleed.      Orthostatic dizziness (Chronic)    This is partially related to her malnutrition, but also somewhat related to dehydration. We discussed the importance of adequate hydration -- 10-12 8-10 ounce glasses of water daily. But also must be eating adequately.  Discussed nutritional supplementation.  I don't think she can tolerate even a low dose of beta blocker, she will simply stop that.      Myocardial infarction involving left anterior descending (LAD) coronary artery (HCC) (Chronic)   Hyperlipidemia LDL goal <70 (Chronic)    LDL was 93 prior to MI. Due for labs to be checked now. She is on atorvastatin 40 mg daily. No cramping.      Relevant Orders   Comprehensive metabolic panel   Lipid panel   Congestive dilated cardiomyopathy (HCC) (Chronic)    Peri-MI EF in the Cath Lab was  very low at 2025%. I don't know if I can believe the report on the post MI transthoracic echo, because it didn't appear to be a reading M.D. The TEE Intra-Op set EF is 40-45% which is probably more likely to be right. Once she is more stable and functional, I would probably recheck an echocardiogram to get a better baseline assessment.  Unfortunately she has profound dizziness, orthostatic and is uneven tolerating low-dose beta blocker. Thankfully no heart failure symptoms.      Relevant Orders   Comprehensive metabolic panel   Lipid panel   CAD (coronary artery disease), native coronary artery - Primary (Chronic)    Severe calcified disease now status post CABG.  Her circumflex is relatively small, therefore not grafted. The RCA had some diffuse calcification but was not severely diseased. She has not had any further anginal symptoms. She is on statin and very low-dose beta blocker which she is not tolerating. She's also on aspirin.      Relevant Orders   Comprehensive metabolic panel   Lipid panel   AAA (abdominal aortic aneurysm) without rupture (HCC) (Chronic)    Now being followed by Dr. Darrick Penna from vascular surgery. This finding was stable from last year. On statin and aspirin. Blood pressure is already too low for any into hypertensives. No signs of claudication.       Other Visit Diagnoses   None.     Current medicines are reviewed at length with the patient today. (+/- concerns) Always dizzy & no energy The following changes have been made: d/c Metoprolol.  Patient Instructions  DISCONTINUE METOPROLOL   AVOID DEHYDRATION HYDRATE 10-12   8-10 OZ GLASS OF WATER  DAILY    ORDER START ENSURE OR BOOST WITH EACH MEAL THREE TIMES A DAY , IN ADDITION 1 TO 2 CANS FOR SNACKS DAILY   PATIENT NEEDS WALKER FOR WALKING  (  FOR HOME )   LABS NEED AT NEXT LAB DRAW FOR FACILITY - CMP ,FASTING LIPIDS  - SEND COPY OF RESULTS TO OFFICE -- FAX [phone] ATT DR [facility]   Your  physician wants you to follow-up in: 3 MONTHS WITH DR HARDING - 30 MIN   If you need a refill on your cardiac medications before your next appointment, please call your pharmacy.     Studies Ordered:   Orders Placed This Encounter  Procedures  . Comprehensive metabolic panel  . Lipid panel      Bryan Lemma, M.D., M.S. Interventional Cardiologist   Pager # [phone] Phone # [phone] [address] [place], [place] [address]

## 2016-02-20 NOTE — Progress Notes (Signed)
Patient ID: Tonya Payne, female   DOB: Nov 15, 1934, 81 y.o.   MRN: [id number]    DATE:  02/20/16  MRN:  [id number]  BIRTHDAY: 1934-12-13  Facility:  Nursing Home Location:  [facility]  Nursing Home Room Number: 701-P  LEVEL OF CARE:  SNF (31)  Contact Information    Name Relation Home Work Mobile   Hagman,Timothy D. Son [phone]  [phone]   Wright,Valie-Weigelt Relative [phone]  [phone]       Code Status History    Date Active Date Inactive Code Status Order ID Comments User Context   01/27/2016 10:24 AM 01/29/2016  8:07 PM DNR [id number]  Albertine Grates, MD Inpatient   01/27/2016  2:26 AM 01/27/2016 10:24 AM Full Code [id number]  Hillary Bow, DO Inpatient   10/06/2015  4:49 PM 10/16/2015 12:28 PM Full Code [id number]  Leone Brand, NP Inpatient   02/25/2015  8:32 PM 02/28/2015 12:29 PM Full Code [id number]  Marlon Pel, PA-C ED   07/09/2013 11:21 AM 07/09/2013  8:06 PM Full Code [id number]  Joya Gaskins, MD ED    Questions for Most Recent Historical Code Status (Order [id number])    Question Answer Comment   In the event of cardiac or respiratory ARREST Do not call a "code blue"    In the event of cardiac or respiratory ARREST Do not perform Intubation, CPR, defibrillation or ACLS    In the event of cardiac or respiratory ARREST Use medication by any route, position, wound care, and other measures to relive pain and suffering. May use oxygen, suction and manual treatment of airway obstruction as needed for comfort.         Advance Directive Documentation   Flowsheet Row Most Recent Value  Type of Advance Directive  Out of facility DNR (pink MOST or yellow form)  Pre-existing out of facility DNR order (yellow form or pink MOST form)  No data  "MOST" Form in Place?  No data       Chief Complaint  Patient presents with  . Medical Management of Chronic Issues    HISTORY OF PRESENT ILLNESS:  This is a 81 year old female who is currently having  a short-term rehabilitation @ [address] She is being seen for a routine visit. She continues to have hypotension despite lowering her Metoprolol to 12.5 mg Q HS - BP 94/74 HR 109 . She complains of dizziness. She will have cardiology consult today. KCL and routine pain medications were discontinued. She is only on PRN Norco for pain.  She has been admitted to [facility] on 01/29/16 from [facility].  She has PMH of CAD S/P CABG. She had a fall and sustained a pelvic fracture and right wrist fracture. Orthopedic consulted and non-operative management was decided upon.    PAST MEDICAL HISTORY:  Past Medical History:  Diagnosis Date  . AAA (abdominal aortic aneurysm) (HCC)   . Abdominal aneurysm (HCC)    CTA 10/23/2015  . Anxiety   . Atrial fibrillation, new onset (HCC) 10/2015   after CABG, in SR at d/c  . CAD (coronary artery disease), native coronary artery 10/08/2015  . Congestive dilated cardiomyopathy (HCC) 10/08/2015  . COPD (chronic obstructive pulmonary disease) (HCC)   . Depression   . Hyperlipidemia LDL goal <70 10/08/2015  . IBS (irritable bowel syndrome)   . Oral thrush   . Post PTCA   . TIA (transient ischemic attack)      CURRENT MEDICATIONS: Reviewed  Patient's  Medications  New Prescriptions   No medications on file  Previous Medications   ALPRAZOLAM (XANAX) 0.25 MG TABLET    Take 1 tablet (0.25 mg total) by mouth 2 (two) times daily as needed for anxiety.   ASPIRIN 325 MG TABLET    Take 325 mg by mouth daily.   ATORVASTATIN (LIPITOR) 40 MG TABLET    Take 1 tablet (40 mg total) by mouth daily at 6 PM.   DOCUSATE SODIUM (COLACE) 100 MG CAPSULE    Take 100 mg by mouth 2 (two) times daily.   FEXOFENADINE (ALLEGRA) 180 MG TABLET    Take 180 mg by mouth daily as needed for allergies or rhinitis.   HYDROCODONE-ACETAMINOPHEN (NORCO/VICODIN) 5-325 MG TABLET    Take one tablet by mouth every 4 hours as needed for pain. Do not exceed 4gm of Tylenol in 24 hours    POLYETHYLENE GLYCOL (MIRALAX / GLYCOLAX) PACKET    Take 17 g by mouth daily.   SERTRALINE (ZOLOFT) 50 MG TABLET    Take 50 mg by mouth every morning.   Modified Medications   No medications on file  Discontinued Medications   METOPROLOL TARTRATE (LOPRESSOR) 25 MG TABLET    Take 1 tablet (25 mg total) by mouth 2 (two) times daily.     No Known Allergies   REVIEW OF SYSTEMS:  GENERAL: no change in appetite, no fatigue, no weight changes, no fever, chills or weakness EYES: Denies change in vision, dry eyes, eye pain, itching or discharge EARS: Denies change in hearing, ringing in ears, or earache NOSE: Denies nasal congestion or epistaxis MOUTH and THROAT: Denies oral discomfort, gingival pain or bleeding, pain from teeth or hoarseness   RESPIRATORY: no cough, SOB, DOE, wheezing, hemoptysis CARDIAC: no chest pain, edema or palpitations GI: no abdominal pain, diarrhea, constipation, heart burn, nausea or vomiting GU: Denies dysuria, frequency, hematuria, incontinence, or discharge PSYCHIATRIC: Denies feeling of depression or anxiety. No report of hallucinations, insomnia, paranoia, or agitation    PHYSICAL EXAMINATION  GENERAL APPEARANCE: Well nourished. Normal body habitus SKIN:  Skin is warm and dry.  HEAD: Normal in size and contour. No evidence of trauma EYES: Lids open and close normally. No blepharitis, entropion or ectropion. PERRL. Conjunctivae are clear and sclerae are white. Lenses are without opacity EARS: Pinnae are normal. Patient hears normal voice tunes of the examiner MOUTH and THROAT: Lips are without lesions. Oral mucosa is moist and without lesions. Tongue is normal in shape, size, and color and without lesions NECK: supple, trachea midline, no neck masses, no thyroid tenderness, no thyromegaly LYMPHATICS: no LAN in the neck, no supraclavicular LAN RESPIRATORY: breathing is even & unlabored, BS CTAB CARDIAC: RRR, no murmur,no extra heart sounds, no edema GI:  abdomen soft, normal BS, no masses, no tenderness, no hepatomegaly, no splenomegaly EXTREMITIES:  Able to move X 4 extremities, right forearm has splint PSYCHIATRIC: Alert and oriented X 3. Affect and behavior are appropriate  LABS/RADIOLOGY: Labs reviewed: 02/19/16  Na 135  K 4.4  Glucose 115  BUN 17  Creatinine 1.32  Ca 9.6  GFR 40.80 02/13/16   Na 130  K 4.7  Glucose 119  BUN 15  Creatinine 0.95  GFR 59.64 Basic Metabolic Panel:  Recent Labs  16/10/96 0426 10/17/15 1605  01/27/16 0839 01/28/16 0235 01/29/16 0408 02/01/16  NA 138  --   < > 132* 133* 134* 136*  K 4.6  --   < > 4.3 4.4 4.1 4.7  CL  109  --   < > 95* 102 104  --   CO2 20*  --   < > 26 21* 20*  --   GLUCOSE 135*  --   < > 103* 93 109*  --   BUN 10  --   < > 18 15 13 21   CREATININE 1.18* 1.60*  < > 1.24* 1.13* 0.90 1.1  CALCIUM 8.2*  --   < > 9.7 9.2 9.2  --   MG 2.4 2.2  --   --  1.9  --   --   < > = values in this interval not displayed.  Liver Function Tests:  Recent Labs  02/25/15 1850 10/06/15 1506 02/01/16  AST 29 30 54*  ALT 6* 14 33  ALKPHOS 49 63 433*  BILITOT 0.9 0.9  --   PROT 6.1* 7.0  --   ALBUMIN 3.4* 3.6  --    CBC:  Recent Labs  10/06/15 1506  01/26/16 2240 01/28/16 0235 01/29/16 0408 02/01/16  WBC 10.3  < > 11.1* 7.6 7.9 7.9  NEUTROABS 7.4  --  8.4*  --   --  5  HGB 11.9*  < > 10.8* 10.7* 10.9* 11.5*  HCT 36.3  < > 32.2* 33.7* 34.5* 36  MCV 94.0  < > 89.9 91.6 91.5  --   PLT 330  < > 300 304 300 300  < > = values in this interval not displayed.  Lipid Panel:  Recent Labs  10/06/15 1506 10/07/15 0326  HDL 107 85   Cardiac Enzymes:  Recent Labs  10/07/15 0326 10/07/15 0924 10/07/15 1619  TROPONINI 3.22* 3.27* 2.67*   CBG:  Recent Labs  10/21/15 1705 10/22/15 0559 10/22/15 1358  GLUCAP 124* 97 133*      Dg Elbow Complete Right (3+view)  Result Date: 01/26/2016 CLINICAL DATA:  Right elbow pain with laceration to the olecranon area after a fall today.  EXAM: RIGHT ELBOW - COMPLETE 3+ VIEW COMPARISON:  None. FINDINGS: There is no evidence of fracture, dislocation, or joint effusion. There is no evidence of arthropathy or other focal bone abnormality. Soft tissues are unremarkable. IMPRESSION: Negative. Electronically Signed   By: Burman Nieves M.D.   On: 01/26/2016 21:41   Dg Wrist Complete Right  Result Date: 01/26/2016 CLINICAL DATA:  Right wrist pain after a fall today. EXAM: RIGHT WRIST - COMPLETE 3+ VIEW COMPARISON:  02/25/2015 FINDINGS: Degenerative changes in the radiocarpal and STT joints. No fractures demonstrated in the carpal bones. Transverse fractures demonstrated in the distal right second and fourth metacarpal bones. Vague oblique linear lucency in the proximal second metacarpal bone could also represent nondisplaced fracture although this is not confirmed on all views. IMPRESSION: Degenerative changes in the right wrist. Fractures demonstrated in the right second and fourth metacarpal bones at the distal shaft. Possible nondisplaced fracture of the proximal second metacarpal bone. Carpal bones appear intact. Electronically Signed   By: Burman Nieves M.D.   On: 01/26/2016 21:47   Ct Head Wo Contrast  Result Date: 01/26/2016 CLINICAL DATA:  Status post fall to right side with head injury. EXAM: CT HEAD WITHOUT CONTRAST CT CERVICAL SPINE WITHOUT CONTRAST TECHNIQUE: Multidetector CT imaging of the head and cervical spine was performed following the standard protocol without intravenous contrast. Multiplanar CT image reconstructions of the cervical spine were also generated. COMPARISON:  CT head and cervical spine 08/17/2014. FINDINGS: CT HEAD FINDINGS Brain: There has been interval development of lucencies within left lentiform  nucleus and right posterior limb of internal capsule consistent with age-indeterminate lacunar infarcts. Additionally there is a chronic appearing infarct in the right cerebellar hemisphere that is new. Stable  background of moderate chronic microvascular ischemic changes and mild parenchymal volume loss. No evidence of large acute infarct, hemorrhage, hydrocephalus, extra-axial collection, for focal mass effect. Vascular: No hyperdense vessel. Calcific atherosclerosis carotid siphons. Skull: Normal. Negative for fracture or focal lesion. Sinuses/Orbits: No acute finding. Other: None. CT CERVICAL SPINE FINDINGS Alignment: Normal cervical lordosis. Minimal stable C7-T1 anterolisthesis. Anterior cervical discectomy and fusion of C5 through C7. Hardware appears intact. Skull base and vertebrae: No acute fracture. No primary bone lesion or focal pathologic process. Soft tissues and spinal canal: No lymphadenopathy or discrete cervical mass identified on this noncontrast study. The aerodigestive tract is patent. Normal thyroid gland. Dense calcifications of the carotid bifurcations bilaterally. No prevertebral fluid or swelling. No visible canal hematoma.- Disc levels: Degenerative changes of the cervical spine mild multilevel facet arthropathy and discogenic changes with marginal osteophytes and disc space narrowing most pronounced at C7-T1. Disc bulges at the C3-4 and C4-5 levels. Upper chest: Scarring with calcifications in the lung apices. Other: None. IMPRESSION: 1. No skull fracture or intracranial hemorrhage identified. 2. Interval age-indeterminate lacunar infarcts in the basal ganglia bilaterally from 2016. 3. Interval chronic appearing small right occipital lobe infarct. 4. Stable background of moderate chronic microvascular ischemic changes and mild parenchymal volume loss. 5. No acute fracture or dislocation of the cervical spine. 6. Multilevel cervical degenerative changes and intact anterior cervical discectomy and fusion hardware are stable. Electronically Signed   By: Mitzi Hansen M.D.   On: 01/26/2016 22:09   Ct Cervical Spine Wo Contrast  Result Date: 01/26/2016 CLINICAL DATA:  Status post fall  to right side with head injury. EXAM: CT HEAD WITHOUT CONTRAST CT CERVICAL SPINE WITHOUT CONTRAST TECHNIQUE: Multidetector CT imaging of the head and cervical spine was performed following the standard protocol without intravenous contrast. Multiplanar CT image reconstructions of the cervical spine were also generated. COMPARISON:  CT head and cervical spine 08/17/2014. FINDINGS: CT HEAD FINDINGS Brain: There has been interval development of lucencies within left lentiform nucleus and right posterior limb of internal capsule consistent with age-indeterminate lacunar infarcts. Additionally there is a chronic appearing infarct in the right cerebellar hemisphere that is new. Stable background of moderate chronic microvascular ischemic changes and mild parenchymal volume loss. No evidence of large acute infarct, hemorrhage, hydrocephalus, extra-axial collection, for focal mass effect. Vascular: No hyperdense vessel. Calcific atherosclerosis carotid siphons. Skull: Normal. Negative for fracture or focal lesion. Sinuses/Orbits: No acute finding. Other: None. CT CERVICAL SPINE FINDINGS Alignment: Normal cervical lordosis. Minimal stable C7-T1 anterolisthesis. Anterior cervical discectomy and fusion of C5 through C7. Hardware appears intact. Skull base and vertebrae: No acute fracture. No primary bone lesion or focal pathologic process. Soft tissues and spinal canal: No lymphadenopathy or discrete cervical mass identified on this noncontrast study. The aerodigestive tract is patent. Normal thyroid gland. Dense calcifications of the carotid bifurcations bilaterally. No prevertebral fluid or swelling. No visible canal hematoma.- Disc levels: Degenerative changes of the cervical spine mild multilevel facet arthropathy and discogenic changes with marginal osteophytes and disc space narrowing most pronounced at C7-T1. Disc bulges at the C3-4 and C4-5 levels. Upper chest: Scarring with calcifications in the lung apices. Other:  None. IMPRESSION: 1. No skull fracture or intracranial hemorrhage identified. 2. Interval age-indeterminate lacunar infarcts in the basal ganglia bilaterally from 2016. 3. Interval chronic appearing small  right occipital lobe infarct. 4. Stable background of moderate chronic microvascular ischemic changes and mild parenchymal volume loss. 5. No acute fracture or dislocation of the cervical spine. 6. Multilevel cervical degenerative changes and intact anterior cervical discectomy and fusion hardware are stable. Electronically Signed   By: Mitzi HansenLance  Furusawa-Stratton M.D.   On: 01/26/2016 22:09   Ct Pelvis Wo Contrast  Result Date: 01/26/2016 CLINICAL DATA:  Unwitnessed fall 1.5 hours ago. Right-sided body pain. EXAM: CT PELVIS WITHOUT CONTRAST TECHNIQUE: Multidetector CT imaging of the pelvis was performed following the standard protocol without intravenous contrast. Bone algorithm images were obtained for evaluation of pelvis and hips. COMPARISON:  Right hip 01/26/2016. CT abdomen and pelvis 10/23/2015. FINDINGS: Urinary Tract: Technically limited evaluation of the urinary tract due to bone algorithm. Bladder is not abnormally distended and no wall thickening or filling defect noted. Distal ureters are dilated. Bowel: Technically limited evaluation due bone algorithm. Visualize pelvic portion of the colon and small bowel are not abnormally distended. Stool in the colon. Vascular/Lymphatic: Technically limited evaluation due to bone algorithm. No significant lymphadenopathy demonstrated. There is an aneurysm of the abdominal aorta, incompletely included within the field of view, visualized portion measuring 4 cm maximal AP diameter. Peripheral calcification. The aneurysm was present on the previous examination, measuring 4.8 cm maximal diameter. Comparison between the 2 studies is not possible because the entire aortic aneurysm is not included within current study. Reproductive: Technically limited due to bone  algorithm. Uterus appears to be surgically absent. No focal pelvic mass lesions. Other: No evidence of significant muscular hematoma or infiltration. Musculoskeletal: There is a nondisplaced mildly comminuted fracture of the superior pubic ramus on the right. Nondisplaced fracture of the left superior pubic ramus adjacent to the symphysis pubis. No evidence of pubic diastases. Fractures do not involve the symphysis pubis. Inferior pubic rami, acetabuli, pelvis, and hips are otherwise unremarkable. No additional fractures are demonstrated. SI joints are not displaced. Postoperative changes in the lower lumbar spine with lower lumbar laminectomies. IMPRESSION: Nondisplaced fractures of the right and left superior pubic rami anteriorly. No additional acute fractures demonstrated. Incidental note of a abdominal aortic aneurysm, incompletely included within the field of view. This has been present on previous studies. Comparison is not reliable due to incomplete inclusion of the aortic aneurysm on today's study. Electronically Signed   By: Burman NievesWilliam  Stevens M.D.   On: 01/26/2016 21:59   Dg Hand Complete Right  Result Date: 01/26/2016 CLINICAL DATA:  Pain, contusion, and decreased range of motion of the right hand after a fall today. EXAM: RIGHT HAND - COMPLETE 3+ VIEW COMPARISON:  Right wrist 02/25/2015 FINDINGS: There is an acute transverse mildly comminuted fracture of the distal shaft of the right second finger with mild volar angulation. Small focal cortical irregularity and linear lucency in the distal shaft of the fourth metacarpal bone suggesting nondisplaced fracture no other acute fractures identified. Degenerative changes in the interphalangeal joints. Degenerative changes in the STT joints. Soft tissue swelling. IMPRESSION: Transverse mildly comminuted fracture of the distal shaft right second metacarpal bone. Nondisplaced fracture of the distal shaft right fourth metacarpal bone. Electronically Signed    By: Burman NievesWilliam  Stevens M.D.   On: 01/26/2016 21:40   Dg Hip Unilat W Or Wo Pelvis 2-3 Views Right  Result Date: 01/26/2016 CLINICAL DATA:  Pain following fall EXAM: DG HIP (WITH OR WITHOUT PELVIS) 2-3V RIGHT COMPARISON:  None. FINDINGS: Frontal pelvis as well as frontal and lateral right hip images were obtained. There is  a small avulsion arising from the dorsal aspect of the medial superior pubic ramus on the right. No other evidence of fracture. No dislocation. Bones are osteoporotic. There is mild symmetric narrowing of both hip joints. No erosive change. There is postoperative change at L5. There are foci of arterial vascular calcification in the common and external iliac arteries bilaterally. IMPRESSION: Small avulsion along the dorsal aspect of the medial portion of the right superior pubic ramus. No other evidence of fracture. No dislocation. Mild symmetric narrowing both hip joints. Bones appear osteoporotic. There is arterial vascular atherosclerosis. Electronically Signed   By: Bretta Bang III M.D.   On: 01/26/2016 21:43    ASSESSMENT/PLAN:  Unsteady gait - continue rehabilitation, PT and OT, for therapeutic strengthening exercises; fall precaution  Right pelvic and wrist fracture - continue rehabilitation, PT and OT, for therapeutic strengthening exercises; continue Norco 5/325 mg 1 tab PO Q 4 hours PRN for pain; ASA 325 mg 1 tab PO Q D for DVT prophylaxis; follow-up with orthopedics  Anxiety - mood is stable; continue Xanax 0.25 mg 1 tab PO BID PRN  Allergic rhinitis - continue Allegra 180 mg 1 PO Q D PRN  Constipation - continue Colace 100 mg 1 capsule PO BID, Miralax 17 gm PO Q D  Depression - continue Zoloft 50 mg 1 tab PO Q D  Atrial Fibrillation - continue Metoprolol tartrate 25 mg 1/2 tab = 12.5 mg PO Q HS, hold for SBP <100; continue  ASA 325 mg PO Q D; will follow-up with cardiologist, Dr. Bryan Lemma  Hyperlipidemia - continue Lipitor 40 mg 1 tab PO Q D Lab Results   Component Value Date   CHOL 200 10/07/2015   HDL 85 10/07/2015   LDLCALC 93 10/07/2015   TRIG 112 10/07/2015   CHOLHDL 2.4 10/07/2015   Orthostatic hypotension - Metoprolol was decreased to 12.5 mg PO Q HS, hold for SBP <100; follow-up with cardilogist  CKD, stage 3 - creatinine 1.32; will monitor  CAD S/P CABG - stable; continue ASA 325 ng 1 tab PO Q D, Lopressor and Lipitor     Goals of care:  Short-term rehabilitation     Kenard Gower, NP [facility] [phone]

## 2016-02-20 NOTE — Patient Instructions (Signed)
DISCONTINUE METOPROLOL   AVOID DEHYDRATION HYDRATE 10-12   8-10 OZ GLASS OF WATER  DAILY    ORDER START ENSURE OR BOOST WITH EACH MEAL THREE TIMES A DAY , IN ADDITION 1 TO 2 CANS FOR SNACKS DAILY   PATIENT NEEDS WALKER FOR WALKING  ( FOR HOME )   LABS NEED AT NEXT LAB DRAW FOR FACILITY - CMP ,FASTING LIPIDS  - SEND COPY OF RESULTS TO OFFICE -- FAX [phone] [facility]   Your physician wants you to follow-up in: 3 MONTHS WITH DR HARDING - 30 MIN   If you need a refill on your cardiac medications before your next appointment, please call your pharmacy.

## 2016-02-21 ENCOUNTER — Encounter: Payer: Self-pay | Admitting: Cardiology

## 2016-02-22 DIAGNOSIS — E43 Unspecified severe protein-calorie malnutrition: Secondary | ICD-10-CM | POA: Insufficient documentation

## 2016-02-22 DIAGNOSIS — I9789 Other postprocedural complications and disorders of the circulatory system, not elsewhere classified: Secondary | ICD-10-CM

## 2016-02-22 DIAGNOSIS — R42 Dizziness and giddiness: Secondary | ICD-10-CM | POA: Insufficient documentation

## 2016-02-22 DIAGNOSIS — I4891 Unspecified atrial fibrillation: Secondary | ICD-10-CM | POA: Insufficient documentation

## 2016-02-22 NOTE — Assessment & Plan Note (Addendum)
Was sinus rhythm on discharge. Has not had any symptoms to suggest A. fib. Will need to monitor.  This patients CHA2DS2-VASc Score and unadjusted Ischemic Stroke Rate (% per year) is equal to 7.2 % stroke rate/year from a score of 5 Above score calculated as 1 point each if present [CHF, HTN, DM, Vascular=MI/PAD/Aortic Plaque, Age if 3165-74, or Female]; Above score calculated as 2 points each if present [Age > 75, or Stroke/TIA/TE]  - For now, I would not anticoagulate unless she proves to have recurrence. Somewhat concerned with her instability that she may fall and have a bleed.

## 2016-02-22 NOTE — Assessment & Plan Note (Signed)
LDL was 93 prior to MI. Due for labs to be checked now. She is on atorvastatin 40 mg daily. No cramping.

## 2016-02-22 NOTE — Assessment & Plan Note (Signed)
Severe calcified disease now status post CABG.  Her circumflex is relatively small, therefore not grafted. The RCA had some diffuse calcification but was not severely diseased. She has not had any further anginal symptoms. She is on statin and very low-dose beta blocker which she is not tolerating. She's also on aspirin.

## 2016-02-22 NOTE — Assessment & Plan Note (Addendum)
Now being followed by Dr. Darrick PennaFields from vascular surgery. This finding was stable from last year. On statin and aspirin. Blood pressure is already too low for any into hypertensives. No signs of claudication.

## 2016-02-22 NOTE — Assessment & Plan Note (Signed)
This is partially related to her malnutrition, but also somewhat related to dehydration. We discussed the importance of adequate hydration -- 10-12 8-10 ounce glasses of water daily. But also must be eating adequately.  Discussed nutritional supplementation.  I don't think she can tolerate even a low dose of beta blocker, she will simply stop that.

## 2016-02-22 NOTE — Assessment & Plan Note (Signed)
Really not 8 Drinking well. Apparently she was not able to get ensure at the skilled nursing facility. I strongly recommend that she has a protein shake supplementation for each meal of the day as well as at least 1 not to supplement Terry shakes during the course of the day. She needs to maintain adequate hydration as well to avoid constipation. She still needs to try to eat real food as well. This is definitely contributing to her dizziness. It is also diminish her ability to recover from her operation and now pelvic fracture.

## 2016-02-22 NOTE — Assessment & Plan Note (Signed)
Peri-MI EF in the Cath Lab was very low at 2025%. I don't know if I can believe the report on the post MI transthoracic echo, because it didn't appear to be a reading M.D. The TEE Intra-Op set EF is 40-45% which is probably more likely to be right. Once she is more stable and functional, I would probably recheck an echocardiogram to get a better baseline assessment.  Unfortunately she has profound dizziness, orthostatic and is uneven tolerating low-dose beta blocker. Thankfully no heart failure symptoms.

## 2016-02-27 ENCOUNTER — Ambulatory Visit (INDEPENDENT_AMBULATORY_CARE_PROVIDER_SITE_OTHER): Payer: Medicare Other

## 2016-02-27 ENCOUNTER — Non-Acute Institutional Stay (SKILLED_NURSING_FACILITY): Payer: Medicare Other | Admitting: Adult Health

## 2016-02-27 ENCOUNTER — Encounter: Payer: Self-pay | Admitting: Vascular Surgery

## 2016-02-27 ENCOUNTER — Ambulatory Visit (INDEPENDENT_AMBULATORY_CARE_PROVIDER_SITE_OTHER): Payer: No Typology Code available for payment source

## 2016-02-27 ENCOUNTER — Ambulatory Visit (INDEPENDENT_AMBULATORY_CARE_PROVIDER_SITE_OTHER): Payer: Medicare Other | Admitting: Orthopaedic Surgery

## 2016-02-27 ENCOUNTER — Encounter: Payer: Self-pay | Admitting: Adult Health

## 2016-02-27 ENCOUNTER — Encounter (INDEPENDENT_AMBULATORY_CARE_PROVIDER_SITE_OTHER): Payer: Self-pay | Admitting: Orthopaedic Surgery

## 2016-02-27 VITALS — BP 97/67 | HR 102

## 2016-02-27 DIAGNOSIS — S32591S Other specified fracture of right pubis, sequela: Secondary | ICD-10-CM

## 2016-02-27 DIAGNOSIS — S62330D Displaced fracture of neck of second metacarpal bone, right hand, subsequent encounter for fracture with routine healing: Secondary | ICD-10-CM | POA: Diagnosis not present

## 2016-02-27 DIAGNOSIS — I4891 Unspecified atrial fibrillation: Secondary | ICD-10-CM

## 2016-02-27 DIAGNOSIS — R748 Abnormal levels of other serum enzymes: Secondary | ICD-10-CM | POA: Diagnosis not present

## 2016-02-27 DIAGNOSIS — H8110 Benign paroxysmal vertigo, unspecified ear: Secondary | ICD-10-CM

## 2016-02-27 DIAGNOSIS — I951 Orthostatic hypotension: Secondary | ICD-10-CM

## 2016-02-27 DIAGNOSIS — S329XXS Fracture of unspecified parts of lumbosacral spine and pelvis, sequela: Secondary | ICD-10-CM

## 2016-02-27 NOTE — Progress Notes (Deleted)
Office Visit Note   Patient: Tonya Payne           Date of Birth: Jun 12, 1934           MRN: [id number] Visit Date: 02/27/2016              Requested by: Rocky Morel, MD [address] [place], [place] [address] PCP: Rocky Morel, MD   Assessment & Plan: Visit Diagnoses: No diagnosis found.  Plan: ***  Follow-Up Instructions: No Follow-up on file.   Orders:  No orders of the defined types were placed in this encounter.  No orders of the defined types were placed in this encounter.     Procedures: No procedures performed   Clinical Data: No additional findings.   Subjective: Chief Complaint  Patient presents with  . Pelvis - Fracture  . Right Wrist - Fracture  . Fracture    B suprapubic rami fractures and right wrist fracture    Patient fell 4 weeks ago at her house. Went to [facility], xrays and CT taken at that visit. Patient states today that her arm is doing well, not complaining with any pain. Also denies pain in pelvis area.     Review of Systems   Objective: Vital Signs: There were no vitals taken for this visit.  Physical Exam  Ortho Exam  Specialty Comments:  No specialty comments available.  Imaging: No results found.   PMFS History: Patient Active Problem List   Diagnosis Date Noted  . Protein-calorie malnutrition, severe (HCC) 02/22/2016  . Orthostatic dizziness 02/22/2016  . Postoperative atrial fibrillation (HCC) 02/22/2016  . Fall   . Hand fracture   . Pelvic fracture (HCC) 01/26/2016  . AAA (abdominal aortic aneurysm) without rupture (HCC) 11/30/2015  . Post PTCA   . S/P CABG x 3 10/16/2015  . CAD (coronary artery disease), native coronary artery 10/08/2015  . Congestive dilated cardiomyopathy (HCC) 10/08/2015  . Hyperlipidemia LDL goal <70 10/08/2015  . UTI (urinary tract infection) 10/06/2015  . Myocardial infarction involving left anterior descending (LAD) coronary artery (HCC) 10/06/2015   Past Medical  History:  Diagnosis Date  . AAA (abdominal aortic aneurysm) without rupture (HCC) 2016   CTA 10/23/2015 - saccular infrarenal aneurysm roughly 4.8 cm in diameter. Stable from 2016 -- Dr. Darrick Penna  . Anxiety   . Atrial fibrillation, new onset (HCC) 10/2015   after CABG, in SR at d/c  . CAD (coronary artery disease), native coronary artery 10/08/2015   Severe coronary disease with calcified left main and LAD: 5% left main going into ostial LAD. Mid LAD 95% (treated with PTCA). Proximal RCA 50%, calcified. EF was 20-25% with apical akinesis and distal anterior hypokinesis. --> Referred for CABG  . Congestive dilated cardiomyopathy (HCC) 10/08/2015   Intra-Op TEE showed EF of 40 and 45%. She had an echo with a "normal LV function "documented, but there was no reading M.D.  . COPD (chronic obstructive pulmonary disease) (HCC)   . Depression   . History of acute anterior wall MI 10/08/2015   Presented with severe chest pain and dynamic anterior ST elevations/biphasic elevations area did not meet full criteria for STEMI, and troponin was not dramatically elevated. 95% LAD treated with PTCA followed by CABG  . Hyperlipidemia LDL goal <70 10/08/2015  . IBS (irritable bowel syndrome)   . Oral thrush   . Post PTCA 10/08/2015   Presented with STEMI, severe mid LAD lesion treated with PTCA and then referred for CABG.  Marland Kitchen  TIA (transient ischemic attack)     Family History  Problem Relation Age of Onset  . Stroke Mother     dead  . Clotting disorder Father     dead  bone marrow disease    Past Surgical History:  Procedure Laterality Date  . BACK SURGERY    . CARDIAC CATHETERIZATION N/A 10/06/2015   Procedure: Left Heart Cath and Coronary Angiography;  Surgeon: Marykay Lexavid W Harding, MD;  [facility];  Service: Cardiovascular;: Heavily calcified left main-LAD. 75% LM & Ost LAD, 95% d-mLAD, p-mRCA Calcified 50%. EF ~20-25%  . CARDIAC CATHETERIZATION N/A 10/06/2015   Procedure: Coronary Balloon  Angioplasty;  Surgeon: Marykay Lexavid W Harding, MD;  [facility];  Service: Cardiovascular: PTCA of 95% LAD in setting of STEMI - reduced to 70% --> would need Atherectomy-PCI vs. CABG.  Sent for CABG.  . COLECTOMY    . CORONARY ARTERY BYPASS GRAFT N/A 10/16/2015   Procedure: CORONARY ARTERY BYPASS GRAFTING (CABG)  x three, using left internal mammary artery and right leg greater saphenous vein harvested endoscopically;  Surgeon: Loreli SlotSteven C Hendrickson, MD;  [facility];  Service: Open Heart Surgery;  Laterality: N/A;  . NECK SURGERY    . TEE WITHOUT CARDIOVERSION N/A 10/16/2015   Procedure: TRANSESOPHAGEAL ECHOCARDIOGRAM (TEE);  Surgeon: Loreli SlotSteven C Hendrickson, MD;  [facility];  Service: Open Heart Surgery - IntraOp:  EF 40-45%.   Social History   Occupational History  . Not on file.   Social History Main Topics  . Smoking status: Former Smoker    Packs/day: 0.50    Types: Cigarettes    Quit date: 04/11/2015  . Smokeless tobacco: Never Used  . Alcohol use 1.2 oz/week    2 Glasses of wine per week  . Drug use: No  . Sexual activity: No

## 2016-02-27 NOTE — Progress Notes (Signed)
Patient ID: Tonya Payne, female   DOB: 1934/08/11, 81 y.o.   MRN: [id number]    DATE:  02/27/16  MRN:  [id number]  BIRTHDAY: [birth year]  Facility:  [facility]ursing Home Location:  [place] Room Number: 159-Y  LEVEL OF CARE:  SNF (31)  Contact Information    Name Relation Home Work Mobile   Hagman,Timothy D. Son [phone]  [phone]   Wright,Valie-Herd Relative [phone]  [phone]       Code Status History    Date Active Date Inactive Code Status Order ID Comments User Context   01/27/2016 10:24 AM 01/29/2016  8:07 PM DNR [id number]  Florencia Reasons, MD Inpatient   01/27/2016  2:26 AM 01/27/2016 10:24 AM Full Code [id number]  Etta Quill, DO Inpatient   10/06/2015  4:49 PM 10/16/2015 12:28 PM Full Code [id number]  Isaiah Serge, NP Inpatient   02/25/2015  8:32 PM 02/28/2015 12:29 PM Full Code [id number]  Delos Haring, PA-C ED   07/09/2013 11:21 AM 07/09/2013  8:06 PM Full Code [id number]  Sharyon Cable, MD ED    Questions for Most Recent Historical Code Status (Order [id number])    Question Answer Comment   In the event of cardiac or respiratory ARREST Do not call a "code blue"    In the event of cardiac or respiratory ARREST Do not perform Intubation, CPR, defibrillation or ACLS    In the event of cardiac or respiratory ARREST Use medication by any route, position, wound care, and other measures to relive pain and suffering. May use oxygen, suction and manual treatment of airway obstruction as needed for comfort.         Advance Directive Documentation   Flowsheet Row Most Recent Value  Type of Advance Directive  Out of facility DNR (pink MOST or yellow form)  Pre-existing out of facility DNR order (yellow form or pink MOST form)  No data  "MOST" Form in Place?  No data       Chief Complaint  Patient presents with  . Acute Visit    Orthostatic hypotension    HISTORY OF PRESENT ILLNESS:  This is a 81 year old female who is currently  having a short-term rehabilitation @ [facility]. She was having orthostatic hypotension during therapy. Her Metoprolol was eventually discontinued by her cardiologist. She is no longer taking any anti-hypertensive medication but still having orthostatic hypotension . BP are as follows:  Lying down 120/75  HR 85, standing 98/67 and complains of dizziness. Therapy has to put patient back to bed due to complaints of dizziness. Therapy has been limited by hypotension and dizziness.  She has been admitted to [facility] on 01/29/16 from [facility].  She has PMH of CAD S/P CABG. She had a fall and sustained a pelvic fracture and right wrist fracture. Orthopedic consulted and non-operative management was decided upon.    PAST MEDICAL HISTORY:  Past Medical History:  Diagnosis Date  . AAA (abdominal aortic aneurysm) without rupture ([place]) 2016   CTA 10/23/2015 - saccular infrarenal aneurysm roughly 4.8 cm in diameter. Stable from 2016 -- Dr. Oneida Alar  . Anxiety   . Atrial fibrillation, new onset ([place]) 10/2015   after CABG, in SR at d/c  . CAD (coronary artery disease), native coronary artery 10/08/2015   Severe coronary disease with calcified left main and LAD: 5% left main going into ostial LAD. Mid LAD 95% (treated with PTCA). Proximal RCA 50%, calcified. EF was 20-25% with apical  akinesis and distal anterior hypokinesis. --> Referred for CABG  . Congestive dilated cardiomyopathy ([place]) 10/08/2015   Intra-Op TEE showed EF of 40 and 45%. She had an echo with a "normal LV function "documented, but there was no reading M.D.  . COPD (chronic obstructive pulmonary disease) ([place])   . Depression   . History of acute anterior wall MI 10/08/2015   Presented with severe chest pain and dynamic anterior ST elevations/biphasic elevations area did not meet full criteria for STEMI, and troponin was not dramatically elevated. 95% LAD treated with PTCA followed by CABG  . Hyperlipidemia LDL goal <70 10/08/2015   . IBS (irritable bowel syndrome)   . Oral thrush   . Post PTCA 10/08/2015   Presented with STEMI, severe mid LAD lesion treated with PTCA and then referred for CABG.  . TIA (transient ischemic attack)      CURRENT MEDICATIONS: Reviewed  Patient's Medications  New Prescriptions   No medications on file  Previous Medications   ALPRAZOLAM (XANAX) 0.25 MG TABLET    Take 1 tablet (0.25 mg total) by mouth 2 (two) times daily as needed for anxiety.   ASPIRIN 325 MG TABLET    Take 325 mg by mouth daily.   ATORVASTATIN (LIPITOR) 40 MG TABLET    Take 1 tablet (40 mg total) by mouth daily at 6 PM.   DOCUSATE SODIUM (COLACE) 100 MG CAPSULE    Take 100 mg by mouth 2 (two) times daily.   FEXOFENADINE (ALLEGRA) 180 MG TABLET    Take 180 mg by mouth daily as needed for allergies or rhinitis.   HYDROCODONE-ACETAMINOPHEN (NORCO/VICODIN) 5-325 MG TABLET    Take one tablet by mouth every 4 hours as needed for pain. Do not exceed 4gm of Tylenol in 24 hours   MIDODRINE (PROAMATINE) 2.5 MG TABLET    Take 2.5 mg by mouth daily.   NUTRITIONAL SUPPLEMENT LIQD    Take 120 mLs by mouth 5 (five) times daily. MedPass with meals and snacks (5 times a day total)   POLYETHYLENE GLYCOL (MIRALAX / GLYCOLAX) PACKET    Take 17 g by mouth daily.   SERTRALINE (ZOLOFT) 50 MG TABLET    Take 50 mg by mouth every morning.   Modified Medications   No medications on file  Discontinued Medications   No medications on file     No Known Allergies   REVIEW OF SYSTEMS:  GENERAL: no change in appetite, no fatigue, no weight changes, no fever, chills or weakness EYES: Denies change in vision, dry eyes, eye pain, itching or discharge EARS: Denies change in hearing, ringing in ears, or earache NOSE: Denies nasal congestion or epistaxis MOUTH and THROAT: Denies oral discomfort, gingival pain or bleeding, pain from teeth or hoarseness   RESPIRATORY: no cough, SOB, DOE, wheezing, hemoptysis CARDIAC: no chest pain, edema or  palpitations GI: no abdominal pain, diarrhea, constipation, heart burn, nausea or vomiting GU: Denies dysuria, frequency, hematuria, incontinence, or discharge PSYCHIATRIC: Denies feeling of depression or anxiety. No report of hallucinations, insomnia, paranoia, or agitation    PHYSICAL EXAMINATION  GENERAL APPEARANCE: Well nourished. Normal body habitus SKIN:  Skin is warm and dry.  HEAD: Normal in size and contour. No evidence of trauma EYES: Lids open and close normally. No blepharitis, entropion or ectropion. PERRL. Conjunctivae are clear and sclerae are white. Lenses are without opacity EARS: Pinnae are normal. Patient hears normal voice tunes of the examiner MOUTH and THROAT: Lips are without lesions. Oral mucosa is  moist and without lesions. Tongue is normal in shape, size, and color and without lesions NECK: supple, trachea midline, no neck masses, no thyroid tenderness, no thyromegaly LYMPHATICS: no LAN in the neck, no supraclavicular LAN RESPIRATORY: breathing is even & unlabored, BS CTAB CARDIAC: RRR, no murmur,no extra heart sounds, no edema GI: abdomen soft, normal BS, no masses, no tenderness, no hepatomegaly, no splenomegaly EXTREMITIES:  Able to move X 4 extremities, right forearm has splint PSYCHIATRIC: Alert and oriented X 3. Affect and behavior are appropriate  LABS/RADIOLOGY: Labs reviewed: 02/19/16  Na 136  K 4.4  Glucose 116  BUN 17  Creatinine 1.37  Ca 9.8 GFR 40.80 albumin 4.16  Total protein 7.1  Total bilirubin 0.26  Alk phos 251  SGOT 25  SGPT 14  GFR 40.80 02/13/16   Na 130  K 4.7  Glucose 119  BUN 15  Creatinine 0.95  GFR 27.07 Basic Metabolic Panel:  Recent Labs  10/17/15 0426 10/17/15 1605  01/27/16 0839 01/28/16 0235 01/29/16 0408 02/01/16  NA 138  --   < > 132* 133* 134* 136*  K 4.6  --   < > 4.3 4.4 4.1 4.7  CL 109  --   < > 95* 102 104  --   CO2 20*  --   < > 26 21* 20*  --   GLUCOSE 135*  --   < > 103* 93 109*  --   BUN 10  --   < > 18 15  13 21   CREATININE 1.18* 1.60*  < > 1.24* 1.13* 0.90 1.1  CALCIUM 8.2*  --   < > 9.7 9.2 9.2  --   MG 2.4 2.2  --   --  1.9  --   --   < > = values in this interval not displayed.  Liver Function Tests:  Recent Labs  10/06/15 1506 02/01/16  AST 30 54*  ALT 14 33  ALKPHOS 63 433*  BILITOT 0.9  --   PROT 7.0  --   ALBUMIN 3.6  --    CBC:  Recent Labs  10/06/15 1506  01/26/16 2240 01/28/16 0235 01/29/16 0408 02/01/16  WBC 10.3  < > 11.1* 7.6 7.9 7.9  NEUTROABS 7.4  --  8.4*  --   --  5  HGB 11.9*  < > 10.8* 10.7* 10.9* 11.5*  HCT 36.3  < > 32.2* 33.7* 34.5* 36  MCV 94.0  < > 89.9 91.6 91.5  --   PLT 330  < > 300 304 300 300  < > = values in this interval not displayed.  Lipid Panel:  Recent Labs  10/06/15 1506 10/07/15 0326  HDL 107 85   Cardiac Enzymes:  Recent Labs  10/07/15 0326 10/07/15 0924 10/07/15 1619  TROPONINI 3.22* 3.27* 2.67*   CBG:  Recent Labs  10/21/15 1705 10/22/15 0559 10/22/15 1358  GLUCAP 124* 97 133*      Xr Wrist Complete Right  Result Date: 02/27/2016 X-rays of the right hand is reviewed. This shows interval healing of the second metacarpal neck fracture. Fracture of the base of the third and fourth metacarpals are healing nicely and are nondisplaced. Impression: healing second third and fourth metacarpal fractures  Xr Pelvis 1-2 Views  Result Date: 02/27/2016 AP pelvis is obtained. This shows interval healing of the right superior pubic rami fracture. No subluxation of the SI joint. She may have some the excessive stool in the rectal vault consistent with possible impaction. Impression  healing pubic rami fracture.   ASSESSMENT/PLAN:  Orthostatic hypotension - Metoprolol was recently discontinued; start Midodrine 2.5 mg 1 tab PO Q D; check orthostatic BP/HR BID X 1 week  Atrial Fibrillation - rate-controlled; continue  ASA 325 mg PO Q D and Metoprolol was recently discontinued  Vertigo - start Meclizine 25 mg 1 tab PO  BID PRN  Right pelvic and wrist fracture - continue rehabilitation, PT and OT, for therapeutic strengthening exercises; continue Norco 5/325 mg 1 tab PO Q 4 hours PRN for pain; ASA 325 mg 1 tab PO Q D for DVT prophylaxis; follow-up with orthopedics  CKD, stage 3 - creatinine 1.37; re-check CMP  Elevated liver enzyme - alk phos 251, down from 433; will re-check [place], NP [place]

## 2016-02-27 NOTE — Progress Notes (Signed)
Office Visit Note   Patient: Tonya Payne           Date of Birth: October 17, 1934           MRN: [id number] Visit Date: 02/27/2016              Requested by: Rocky Morel, MD [address] [place], [place] [address] PCP: Rocky Morel, MD   Assessment & Plan: Visit Diagnoses:  1. Closed fracture of multiple pubic rami, right, sequela   2. Closed displaced fracture of neck of second metacarpal bone of right hand with routine healing, subsequent encounter     Plan: Follow-up 1 month for recheck. Clinical exam only she should not require x-rays  Follow-Up Instructions: Return in about 1 month (around 03/29/2016).   Orders:  Orders Placed This Encounter  Procedures  . Wrist splint  . XR Pelvis 1-2 Views  . XR Wrist Complete Right   No orders of the defined types were placed in this encounter.     Procedures: No procedures performed   Clinical Data: No additional findings.   Subjective: Chief Complaint  Patient presents with  . Pelvis - Fracture  . Right Wrist - Fracture  . Fracture    B suprapubic rami fractures and right wrist fracture    HPI fell 4 wks ago. Seen 9/22 after fall with above injuries. Patient fell in the kitchen at her home. She had the fracture the right pubic rami fractures of the right hand second third and and fourth metacarpals. Patient been treated in a splint she's been staying at [facility] and has been undergoing therapy.  Review of Systems updated, no change   Objective: Vital Signs: BP 97/67   Pulse (!) 102   Physical Exam patient's alert and oriented no other change in physical exam. She is neurologically intact minimal swelling of the fingers. She can now flex fingertips within 1 cm fourth and third fingertips to distal palmar crease. Second finger lacks 1.5 cm and is progressing with flexion.  Ortho Exam minimal finger swelling. Splint is intact. No significant swelling lower extremities  Specialty Comments:  No  specialty comments available.  Imaging: Xr Wrist Complete Right  Result Date: 02/27/2016 X-rays of the right hand is reviewed. This shows interval healing of the second metacarpal neck fracture. Fracture of the base of the third and fourth metacarpals are healing nicely and are nondisplaced. Impression: healing second third and fourth metacarpal fractures  Xr Pelvis 1-2 Views  Result Date: 02/27/2016 AP pelvis is obtained. This shows interval healing of the right superior pubic rami fracture. No subluxation of the SI joint. She may have some the excessive stool in the rectal vault consistent with possible impaction. Impression healing pubic rami fracture.    PMFS History: Patient Active Problem List   Diagnosis Date Noted  . Closed fracture of multiple pubic rami, right, sequela 02/27/2016  . Displaced fracture of neck of right second metacarpal bone with routine healing 02/27/2016  . Protein-calorie malnutrition, severe (HCC) 02/22/2016  . Orthostatic dizziness 02/22/2016  . Postoperative atrial fibrillation (HCC) 02/22/2016  . Fall   . Hand fracture   . Pelvic fracture (HCC) 01/26/2016  . AAA (abdominal aortic aneurysm) without rupture (HCC) 11/30/2015  . Post PTCA   . S/P CABG x 3 10/16/2015  . CAD (coronary artery disease), native coronary artery 10/08/2015  . Congestive dilated cardiomyopathy (HCC) 10/08/2015  . Hyperlipidemia LDL goal <70 10/08/2015  . UTI (urinary tract infection) 10/06/2015  .  Myocardial infarction involving left anterior descending (LAD) coronary artery (HCC) 10/06/2015   Past Medical History:  Diagnosis Date  . AAA (abdominal aortic aneurysm) without rupture (HCC) 2016   CTA 10/23/2015 - saccular infrarenal aneurysm roughly 4.8 cm in diameter. Stable from 2016 -- Dr. Darrick Penna  . Anxiety   . Atrial fibrillation, new onset (HCC) 10/2015   after CABG, in SR at d/c  . CAD (coronary artery disease), native coronary artery 10/08/2015   Severe coronary  disease with calcified left main and LAD: 5% left main going into ostial LAD. Mid LAD 95% (treated with PTCA). Proximal RCA 50%, calcified. EF was 20-25% with apical akinesis and distal anterior hypokinesis. --> Referred for CABG  . Congestive dilated cardiomyopathy (HCC) 10/08/2015   Intra-Op TEE showed EF of 40 and 45%. She had an echo with a "normal LV function "documented, but there was no reading M.D.  . COPD (chronic obstructive pulmonary disease) (HCC)   . Depression   . History of acute anterior wall MI 10/08/2015   Presented with severe chest pain and dynamic anterior ST elevations/biphasic elevations area did not meet full criteria for STEMI, and troponin was not dramatically elevated. 95% LAD treated with PTCA followed by CABG  . Hyperlipidemia LDL goal <70 10/08/2015  . IBS (irritable bowel syndrome)   . Oral thrush   . Post PTCA 10/08/2015   Presented with STEMI, severe mid LAD lesion treated with PTCA and then referred for CABG.  . TIA (transient ischemic attack)     Family History  Problem Relation Age of Onset  . Stroke Mother     dead  . Clotting disorder Father     dead  bone marrow disease    Past Surgical History:  Procedure Laterality Date  . BACK SURGERY    . CARDIAC CATHETERIZATION N/A 10/06/2015   Procedure: Left Heart Cath and Coronary Angiography;  Surgeon: Marykay Lex, MD;  Location: [facility] INVASIVE CV LAB;  Service: Cardiovascular;: Heavily calcified left main-LAD. 75% LM & Ost LAD, 95% d-mLAD, p-mRCA Calcified 50%. EF ~20-25%  . CARDIAC CATHETERIZATION N/A 10/06/2015   Procedure: Coronary Balloon Angioplasty;  Surgeon: Marykay Lex, MD;  [facility];  Service: Cardiovascular: PTCA of 95% LAD in setting of STEMI - reduced to 70% --> would need Atherectomy-PCI vs. CABG.  Sent for CABG.  . COLECTOMY    . CORONARY ARTERY BYPASS GRAFT N/A 10/16/2015   Procedure: CORONARY ARTERY BYPASS GRAFTING (CABG)  x three, using left internal mammary artery and  right leg greater saphenous vein harvested endoscopically;  Surgeon: Loreli Slot, MD;  Location: [facility] OR;  Service: Open Heart Surgery;  Laterality: N/A;  . NECK SURGERY    . TEE WITHOUT CARDIOVERSION N/A 10/16/2015   Procedure: TRANSESOPHAGEAL ECHOCARDIOGRAM (TEE);  Surgeon: Loreli Slot, MD;  Location: [facility] OR;  Service: Open Heart Surgery - IntraOp:  EF 40-45%.   Social History   Occupational History  . Not on file.   Social History Main Topics  . Smoking status: Former Smoker    Packs/day: 0.50    Types: Cigarettes    Quit date: 04/11/2015  . Smokeless tobacco: Never Used  . Alcohol use 1.2 oz/week    2 Glasses of wine per week  . Drug use: No  . Sexual activity: No

## 2016-02-28 LAB — BASIC METABOLIC PANEL
BUN: 22 mg/dL — AB (ref 4–21)
Creatinine: 1.1 mg/dL (ref 0.5–1.1)
GLUCOSE: 99 mg/dL
POTASSIUM: 4.5 mmol/L (ref 3.4–5.3)
SODIUM: 136 mmol/L — AB (ref 137–147)

## 2016-02-28 LAB — HEPATIC FUNCTION PANEL
ALT: 13 U/L (ref 7–35)
AST: 24 U/L (ref 13–35)
Alkaline Phosphatase: 146 U/L — AB (ref 25–125)
Bilirubin, Total: 0.4 mg/dL

## 2016-02-29 ENCOUNTER — Encounter: Payer: Self-pay | Admitting: Vascular Surgery

## 2016-02-29 ENCOUNTER — Ambulatory Visit (INDEPENDENT_AMBULATORY_CARE_PROVIDER_SITE_OTHER): Payer: Medicare Other | Admitting: Vascular Surgery

## 2016-02-29 VITALS — BP 96/68 | HR 98 | Temp 97.4°F | Resp 16 | Ht 59.0 in | Wt 101.0 lb

## 2016-02-29 DIAGNOSIS — I714 Abdominal aortic aneurysm, without rupture, unspecified: Secondary | ICD-10-CM

## 2016-02-29 DIAGNOSIS — I2510 Atherosclerotic heart disease of native coronary artery without angina pectoris: Secondary | ICD-10-CM | POA: Diagnosis not present

## 2016-02-29 NOTE — Progress Notes (Signed)
POST OPERATIVE OFFICE NOTE    CC:  F/u AAA  HPI:  This is a 81 y.o. female who is s/p CABG back in June 2017.  She was found to have a AAA measuring 5.3 cm.  She is here today to discuss repair of her AAA.  No new changes in her over all health.  She resides at a SNF recovering from her CABG.  She lives alone in an Independent living facility normally.  Other medical problems include COPD, CAD hyperlipidemia all of which are currently stable.  No Known Allergies  Current Outpatient Prescriptions  Medication Sig Dispense Refill  . acetaminophen (TYLENOL) 325 MG tablet Take 650 mg by mouth every 6 (six) hours as needed.    . ALPRAZolam (XANAX) 0.25 MG tablet Take 1 tablet (0.25 mg total) by mouth 2 (two) times daily as needed for anxiety. 10 tablet 0  . aspirin 325 MG tablet Take 325 mg by mouth daily.    Marland Kitchen. atorvastatin (LIPITOR) 40 MG tablet Take 1 tablet (40 mg total) by mouth daily at 6 PM. 30 tablet 3  . docusate sodium (COLACE) 100 MG capsule Take 100 mg by mouth 2 (two) times daily.    . fexofenadine (ALLEGRA) 180 MG tablet Take 180 mg by mouth daily as needed for allergies or rhinitis.    Marland Kitchen. HYDROcodone-acetaminophen (NORCO/VICODIN) 5-325 MG tablet Take one tablet by mouth every 4 hours as needed for pain. Do not exceed 4gm of Tylenol in 24 hours 180 tablet 0  . meclizine (ANTIVERT) 25 MG tablet Take 25 mg by mouth 2 (two) times daily.    . midodrine (PROAMATINE) 2.5 MG tablet Take 2.5 mg by mouth daily.    . polyethylene glycol (MIRALAX / GLYCOLAX) packet Take 17 g by mouth daily.    . sertraline (ZOLOFT) 50 MG tablet Take 50 mg by mouth every morning.     Marland Kitchen. NUTRITIONAL SUPPLEMENT LIQD Take 120 mLs by mouth 5 (five) times daily. MedPass with meals and snacks (5 times a day total)     No current facility-administered medications for this visit.    Facility-Administered Medications Ordered in Other Visits  Medication Dose Route Frequency Provider Last Rate Last Dose  . bivalirudin  (ANGIOMAX) 250 mg in sodium chloride 0.9 % 50 mL (5 mg/mL) infusion  250 mg  Continuous PRN Marykay Lexavid W Harding, MD   Stopped at 10/06/15 1556  . bivalirudin (ANGIOMAX) BOLUS via infusion    PRN Marykay Lexavid W Harding, MD   27.225 mg at 10/06/15 1504  . fentaNYL (SUBLIMAZE) injection    PRN Marykay Lexavid W Harding, MD   25 mcg at 10/06/15 1529  . heparin infusion 2 units/mL in 0.9 % sodium chloride    Continuous PRN Marykay Lexavid W Harding, MD   1,500 mL at 10/06/15 1454  . iopamidol (ISOVUE-370) 76 % injection    PRN Marykay Lexavid W Harding, MD   150 mL at 10/06/15 1554  . labetalol (NORMODYNE,TRANDATE) injection    PRN Marykay Lexavid W Harding, MD   10 mg at 10/06/15 1526  . lidocaine (PF) (XYLOCAINE) 1 % injection    PRN Marykay Lexavid W Harding, MD   2 mL at 10/06/15 1454  . nitroGLYCERIN 1 mg/10 ml (100 mcg/ml) - IR/CATH LAB    PRN Marykay Lexavid W Harding, MD   200 mcg at 10/06/15 1525  . ondansetron (ZOFRAN) injection    PRN Marykay Lexavid W Harding, MD   4 mg at 10/06/15 1453  . Radial Cocktail (Verapamil 2.5 mg, NTG,  Lidocaine)    PRN Marykay Lex, MD      . ticagrelor [facility]) tablet    PRN Marykay Lex, MD   180 mg at 10/06/15 1506  . tirofiban (AGGRASTAT) bolus via infusion    PRN Marykay Lex, MD   907.5 mcg at 10/06/15 1558  . tirofiban (AGGRASTAT) infusion 50 mcg/mL 100 mL    Continuous PRN Marykay Lex, MD 3.3 mL/hr at 10/06/15 1606 0.075 mcg/kg/min at 10/06/15 1606     ROS:  See HPI  Physical Exam:  Vitals:   02/29/16 1254  BP: 96/68  Pulse: 98  Resp: 16  Temp: 97.4 F (36.3 C)    Herat RRR Lungs non labored breathing, CTA Mobility in a WC Palpable abdominal AAA pulse, non tender to palpation Extremities palpable radial, femoral pulses B  Assessment/Plan:  This is a 81 y.o. female with 5.3 cm AAA amenable to percutaneous Gore Excluder stent graft repair. Deconditioned still per patient.  She still resides at a SNF for rehab.  She states she does not eat well because she doesn't like the food, but she does drink Ensure TID  for nutritional supplements.     She wants to wait until she feels stronger and is back at her home prior to moving forward with aneurysm repair surgery.  We will have her f/u in 1 month and get a AAA ultrasound at that time.  It has now been 4 months since her CT scan.     Thomasena Edis EMMA [facility] PA-C [facility] [phone]  Clinic MD:  Pt seen and examined with Dr. Darrick Penna  History and exam details as above. She is still overall very deconditioned. Patient has had previous sizing for Gore Excluder stent graft. She would need a main body 23 x 12 x 14 and right limb 12 x 10-12. This would require 16 [place] and 12 [place] dry seals. She will be scheduled as soon as the patient feels that she is up to the procedure. Otherwise she will follow-up with me in 1 month. She knows to report to the ER she develops severe back or abdominal pain suggesting rupture.  Fabienne Bruns, MD [facility] [place] Office: [phone] Pager: [phone]

## 2016-03-01 ENCOUNTER — Encounter: Payer: Self-pay | Admitting: Internal Medicine

## 2016-03-01 ENCOUNTER — Telehealth (INDEPENDENT_AMBULATORY_CARE_PROVIDER_SITE_OTHER): Payer: Self-pay | Admitting: Orthopaedic Surgery

## 2016-03-01 NOTE — Telephone Encounter (Signed)
I left voicemail for [name] advising. 

## 2016-03-01 NOTE — Telephone Encounter (Signed)
Can remove brace to wash hand. Needs to have brace on the rest of the time. If she was WB with use of hand with walker before last visit she can continue. Can work on finger ROM with OT. Not healed enough for wrist ROM due to metacarpal Fx's for 2 more weeks then OK . ucall thanks

## 2016-03-01 NOTE — Telephone Encounter (Signed)
Creola CornKim Smith, [place] called needing clarification. She states that she is need to know if patient is allowed to weight bearing on her arm, weight bearing on her walker, and is allowed to take her brace off and if so what are guidelines for her taking it off and is allowed ROM with her right hand and wrist.  Contact Info: [phone], Lina SarKim Smith [place]

## 2016-03-01 NOTE — Telephone Encounter (Signed)
[place] in regards to patient's weightbearing status. I had left instructions per Dr. Ophelia CharterYates note, but she still needs more clarification. She states that patient has not been allowed to weightbear through the right hand or wrist and was using an elbow platform walker. They would like to remove the platform and have the patient steady herself with her right hand in the brace to increase ambulation if possible. Please advise.  CB for [employer] CorporationKim [phone]

## 2016-03-01 NOTE — Telephone Encounter (Signed)
Please advise 

## 2016-03-04 NOTE — Telephone Encounter (Signed)
I called [name] and advised. 

## 2016-03-04 NOTE — Telephone Encounter (Signed)
Not for 2 more weeks [facility]

## 2016-03-08 LAB — BASIC METABOLIC PANEL
BUN: 20 mg/dL (ref 4–21)
CREATININE: 1.3 mg/dL — AB (ref 0.5–1.1)
GLUCOSE: 139 mg/dL
POTASSIUM: 4.2 mmol/L (ref 3.4–5.3)
Sodium: 134 mmol/L — AB (ref 137–147)

## 2016-03-13 ENCOUNTER — Non-Acute Institutional Stay (SKILLED_NURSING_FACILITY): Payer: Medicare Other | Admitting: Adult Health

## 2016-03-13 ENCOUNTER — Encounter: Payer: Self-pay | Admitting: Adult Health

## 2016-03-13 DIAGNOSIS — E785 Hyperlipidemia, unspecified: Secondary | ICD-10-CM | POA: Diagnosis not present

## 2016-03-13 DIAGNOSIS — H8110 Benign paroxysmal vertigo, unspecified ear: Secondary | ICD-10-CM

## 2016-03-13 DIAGNOSIS — R2681 Unsteadiness on feet: Secondary | ICD-10-CM | POA: Diagnosis not present

## 2016-03-13 DIAGNOSIS — F419 Anxiety disorder, unspecified: Secondary | ICD-10-CM | POA: Diagnosis not present

## 2016-03-13 DIAGNOSIS — R748 Abnormal levels of other serum enzymes: Secondary | ICD-10-CM | POA: Diagnosis not present

## 2016-03-13 DIAGNOSIS — S62101S Fracture of unspecified carpal bone, right wrist, sequela: Secondary | ICD-10-CM

## 2016-03-13 DIAGNOSIS — N183 Chronic kidney disease, stage 3 unspecified: Secondary | ICD-10-CM

## 2016-03-13 DIAGNOSIS — F329 Major depressive disorder, single episode, unspecified: Secondary | ICD-10-CM

## 2016-03-13 DIAGNOSIS — S329XXS Fracture of unspecified parts of lumbosacral spine and pelvis, sequela: Secondary | ICD-10-CM | POA: Diagnosis not present

## 2016-03-13 DIAGNOSIS — I4891 Unspecified atrial fibrillation: Secondary | ICD-10-CM

## 2016-03-13 DIAGNOSIS — J309 Allergic rhinitis, unspecified: Secondary | ICD-10-CM

## 2016-03-13 DIAGNOSIS — I951 Orthostatic hypotension: Secondary | ICD-10-CM | POA: Diagnosis not present

## 2016-03-13 DIAGNOSIS — F32A Depression, unspecified: Secondary | ICD-10-CM

## 2016-03-13 DIAGNOSIS — K5901 Slow transit constipation: Secondary | ICD-10-CM

## 2016-03-13 NOTE — Progress Notes (Signed)
Patient ID: Tonya Payne, female   DOB: 1934/07/06, 81 y.o.   MRN: [id number]    DATE:  03/13/16  MRN:  [id number]  BIRTHDAY: [birth year]  Facility:  Nursing Home Location:  [place] Room Number: 720-N  LEVEL OF CARE:  SNF (31)  Contact Information    Name Relation Home Work Mobile   Hagman,Timothy D. Son [phone]  [phone]   Wright,Valie-Steinberg Relative [phone]  [phone]       Code Status History    Date Active Date Inactive Code Status Order ID Comments User Context   01/27/2016 10:24 AM 01/29/2016  8:07 PM DNR [id number]  Florencia Reasons, MD Inpatient   01/27/2016  2:26 AM 01/27/2016 10:24 AM Full Code [id number]  Etta Quill, DO Inpatient   10/06/2015  4:49 PM 10/16/2015 12:28 PM Full Code [id number]  Isaiah Serge, NP Inpatient   02/25/2015  8:32 PM 02/28/2015 12:29 PM Full Code [id number]  Delos Haring, PA-C ED   07/09/2013 11:21 AM 07/09/2013  8:06 PM Full Code [id number]  Sharyon Cable, MD ED    Questions for Most Recent Historical Code Status (Order [id number])    Question Answer Comment   In the event of cardiac or respiratory ARREST Do not call a "code blue"    In the event of cardiac or respiratory ARREST Do not perform Intubation, CPR, defibrillation or ACLS    In the event of cardiac or respiratory ARREST Use medication by any route, position, wound care, and other measures to relive pain and suffering. May use oxygen, suction and manual treatment of airway obstruction as needed for comfort.         Advance Directive Documentation   Flowsheet Row Most Recent Value  Type of Advance Directive  Out of facility DNR (pink MOST or yellow form)  Pre-existing out of facility DNR order (yellow form or pink MOST form)  No data  "MOST" Form in Place?  No data       Chief Complaint  Patient presents with  . Discharge Note    HISTORY OF PRESENT ILLNESS:  This is a 81 year old female who is for discharge home with Home health  PT, OT, CNA and Nursing. DME:  Standard wheelchair, cushion, anti-tippers, brake extensions and anti-tippers.  She was having orthostatic hypotension during therapy. Her Metoprolol was eventually discontinued by her cardiologist. Midodrine 5 mg daily was added for orthostatic hypotension.  She has been admitted to [facility] on 01/29/16 from [facility].  She has PMH of CAD S/P CABG. She had a fall and sustained a pelvic fracture and right wrist fracture. Orthopedic consulted and non-operative management was decided upon.   Patient was admitted to this facility for short-term rehabilitation after the patient's recent hospitalization.  Patient has completed SNF rehabilitation and therapy has cleared the patient for discharge.   PAST MEDICAL HISTORY:  Past Medical History:  Diagnosis Date  . AAA (abdominal aortic aneurysm) without rupture ([place]) 2016   CTA 10/23/2015 - saccular infrarenal aneurysm roughly 4.8 cm in diameter. Stable from 2016 -- Dr. Oneida Alar  . Anxiety   . Atrial fibrillation, new onset ([place]) 10/2015   after CABG, in SR at d/c  . CAD (coronary artery disease), native coronary artery 10/08/2015   Severe coronary disease with calcified left main and LAD: 5% left main going into ostial LAD. Mid LAD 95% (treated with PTCA). Proximal RCA 50%, calcified. EF was 20-25% with apical akinesis and distal anterior hypokinesis. -->  Referred for CABG  . Congestive dilated cardiomyopathy ([place]) 10/08/2015   Intra-Op TEE showed EF of 40 and 45%. She had an echo with a "normal LV function "documented, but there was no reading M.D.  . COPD (chronic obstructive pulmonary disease) ([place])   . Depression   . History of acute anterior wall MI 10/08/2015   Presented with severe chest pain and dynamic anterior ST elevations/biphasic elevations area did not meet full criteria for STEMI, and troponin was not dramatically elevated. 95% LAD treated with PTCA followed by CABG  . Hyperlipidemia LDL goal  <70 10/08/2015  . IBS (irritable bowel syndrome)   . Oral thrush   . Post PTCA 10/08/2015   Presented with STEMI, severe mid LAD lesion treated with PTCA and then referred for CABG.  . TIA (transient ischemic attack)      CURRENT MEDICATIONS: Reviewed  Patient's Medications  New Prescriptions   No medications on file  Previous Medications   ACETAMINOPHEN (TYLENOL) 325 MG TABLET    Take 650 mg by mouth every 6 (six) hours as needed for mild pain or moderate pain.    ALPRAZOLAM (XANAX) 0.25 MG TABLET    Take 1 tablet (0.25 mg total) by mouth 2 (two) times daily as needed for anxiety.   ASPIRIN 325 MG TABLET    Take 325 mg by mouth daily.   ATORVASTATIN (LIPITOR) 40 MG TABLET    Take 1 tablet (40 mg total) by mouth daily at 6 PM.   DOCUSATE SODIUM (COLACE) 100 MG CAPSULE    Take 100 mg by mouth 2 (two) times daily.   FEXOFENADINE (ALLEGRA) 180 MG TABLET    Take 180 mg by mouth daily as needed for allergies or rhinitis.   HYDROCODONE-ACETAMINOPHEN (NORCO/VICODIN) 5-325 MG TABLET    Take one tablet by mouth every 4 hours as needed for pain. Do not exceed 4gm of Tylenol in 24 hours   MECLIZINE (ANTIVERT) 25 MG TABLET    Take 25 mg by mouth. 1 tablet BID PRN and 1 tablet qd   MIDODRINE (PROAMATINE) 5 MG TABLET    Take 5 mg by mouth daily.   NUTRITIONAL SUPPLEMENT LIQD    Take 120 mLs by mouth 5 (five) times daily. MedPass with meals and snacks (5 times a day total)   POLYETHYLENE GLYCOL (MIRALAX / GLYCOLAX) PACKET    Take 17 g by mouth daily.   SERTRALINE (ZOLOFT) 50 MG TABLET    Take 50 mg by mouth every morning.   Modified Medications   No medications on file  Discontinued Medications   MIDODRINE (PROAMATINE) 2.5 MG TABLET    Take 2.5 mg by mouth daily.     No Known Allergies   REVIEW OF SYSTEMS:  GENERAL: no change in appetite, no fatigue, no weight changes, no fever, chills or weakness EYES: Denies change in vision, dry eyes, eye pain, itching or discharge EARS: Denies change in  hearing, ringing in ears, or earache NOSE: Denies nasal congestion or epistaxis MOUTH and THROAT: Denies oral discomfort, gingival pain or bleeding, pain from teeth or hoarseness   RESPIRATORY: no cough, SOB, DOE, wheezing, hemoptysis CARDIAC: no chest pain, edema or palpitations GI: no abdominal pain, diarrhea, constipation, heart burn, nausea or vomiting GU: Denies dysuria, frequency, hematuria, incontinence, or discharge PSYCHIATRIC: Denies feeling of depression or anxiety. No report of hallucinations, insomnia, paranoia, or agitation    PHYSICAL EXAMINATION  GENERAL APPEARANCE: Well nourished. Normal body habitus SKIN:  Skin is warm and dry.  HEAD: Normal in size and contour. No evidence of trauma EYES: Lids open and close normally. No blepharitis, entropion or ectropion. PERRL. Conjunctivae are clear and sclerae are white. Lenses are without opacity EARS: Pinnae are normal. Patient hears normal voice tunes of the examiner MOUTH and THROAT: Lips are without lesions. Oral mucosa is moist and without lesions. Tongue is normal in shape, size, and color and without lesions NECK: supple, trachea midline, no neck masses, no thyroid tenderness, no thyromegaly LYMPHATICS: no LAN in the neck, no supraclavicular LAN RESPIRATORY: breathing is even & unlabored, BS CTAB CARDIAC: RRR, no murmur,no extra heart sounds, no edema GI: abdomen soft, normal BS, no masses, no tenderness, no hepatomegaly, no splenomegaly EXTREMITIES:  Able to move X 4 extremities, right forearm has splint PSYCHIATRIC: Alert and oriented X 3. Affect and behavior are appropriate  LABS/RADIOLOGY: Labs reviewed: 02/19/16  Na 136  K 4.4  Glucose 116  BUN 17  Creatinine 1.37  Ca 9.8 GFR 40.80 albumin 4.16  Total protein 7.1  Total bilirubin 0.26  Alk phos 251  SGOT 25  SGPT 14  GFR 40.80 02/13/16   Na 130  K 4.7  Glucose 119  BUN 15  Creatinine 0.95  GFR 29.47 Basic Metabolic Panel:  Recent Labs  10/17/15 0426  10/17/15 1605  01/27/16 0839 01/28/16 0235 01/29/16 0408 02/01/16 02/28/16 03/08/16  NA 138  --   < > 132* 133* 134* 136* 136* 134*  K 4.6  --   < > 4.3 4.4 4.1 4.7 4.5 4.2  CL 109  --   < > 95* 102 104  --   --   --   CO2 20*  --   < > 26 21* 20*  --   --   --   GLUCOSE 135*  --   < > 103* 93 109*  --   --   --   BUN 10  --   < > 18 15 13 21  22* 20  CREATININE 1.18* 1.60*  < > 1.24* 1.13* 0.90 1.1 1.1 1.3*  CALCIUM 8.2*  --   < > 9.7 9.2 9.2  --   --   --   MG 2.4 2.2  --   --  1.9  --   --   --   --   < > = values in this interval not displayed.  Liver Function Tests:  Recent Labs  10/06/15 1506 02/01/16 02/28/16  AST 30 54* 24  ALT 14 33 13  ALKPHOS 63 433* 146*  BILITOT 0.9  --   --   PROT 7.0  --   --   ALBUMIN 3.6  --   --    CBC:  Recent Labs  10/06/15 1506  01/26/16 2240 01/28/16 0235 01/29/16 0408 02/01/16  WBC 10.3  < > 11.1* 7.6 7.9 7.9  NEUTROABS 7.4  --  8.4*  --   --  5  HGB 11.9*  < > 10.8* 10.7* 10.9* 11.5*  HCT 36.3  < > 32.2* 33.7* 34.5* 36  MCV 94.0  < > 89.9 91.6 91.5  --   PLT 330  < > 300 304 300 300  < > = values in this interval not displayed.  Lipid Panel:  Recent Labs  10/06/15 1506 10/07/15 0326  HDL 107 85   Cardiac Enzymes:  Recent Labs  10/07/15 0326 10/07/15 0924 10/07/15 1619  TROPONINI 3.22* 3.27* 2.67*   CBG:  Recent Labs  10/21/15 1705 10/22/15  0559 10/22/15 1358  GLUCAP 124* 97 133*      Xr Wrist Complete Right  Result Date: 02/27/2016 X-rays of the right hand is reviewed. This shows interval healing of the second metacarpal neck fracture. Fracture of the base of the third and fourth metacarpals are healing nicely and are nondisplaced. Impression: healing second third and fourth metacarpal fractures  Xr Pelvis 1-2 Views  Result Date: 02/27/2016 AP pelvis is obtained. This shows interval healing of the right superior pubic rami fracture. No subluxation of the SI joint. She may have some the excessive  stool in the rectal vault consistent with possible impaction. Impression healing pubic rami fracture.   ASSESSMENT/PLAN:   Unsteady gait - for Home health CNA, Nursing, PT and OT, for therapeutic strengthening exercises; fall precaution  Right pelvic and wrist fracture - for Home health CNA, Nursing, PT and OT, for therapeutic strengthening exercises; continue Norco 5/325 mg 1 tab PO Q 4 hours PRN and Tylenol 325 mg 2 tabs = 650 mg by mouth every 6 hours when necessary for pain for pain; ASA 325 mg 1 tab PO Q D for DVT prophylaxis; follow-up with orthopedics   Orthostatic hypotension - Metoprolol was recently discontinued; and started on Midodrine 5 mg 1 tab PO Q D; follow-up with cardiology  Atrial Fibrillation - rate-controlled; continue  ASA 325 mg PO Q D and Metoprolol was recently discontinued  Vertigo -  Recently started on Meclizine 25 mg 1 tab PO Q AM and BID PRN  CKD, stage 3 - stable Lab Results  Component Value Date   CREATININE 1.3 (A) 03/08/2016    Elevated liver enzyme - alk phos 146, trending down  Allergic rhinitis - continue Allegra 180 mg by mouth daily when necessary  Constipation - continue Colace 100 mg 1 capsule by mouth twice a day and MiraLAX 17 g by mouth daily  Depression - continue Zoloft 50 mg 1 tab by mouth daily  Hyperlipidemia - continue Lipitor 40 mg 1 tab by mouth daily Lab Results  Component Value Date   CHOL 200 10/07/2015   HDL 85 10/07/2015   LDLCALC 93 10/07/2015   TRIG 112 10/07/2015   CHOLHDL 2.4 10/07/2015   Anxiety - mood is stable; continue Xanax 0.25 mg 1 tab by mouth twice a day when necessary     I have filled out patient's discharge paperwork and written prescriptions.  Patient will receive home health PT, OT, Nursing and CNA.  DME provided: Standard wheelchair, cushion, anti-tippers, brake extensions and anti-tippers  Total discharge time: Greater than 30 minutes Greater than 50% was spent in counseling and coordination  of care with the patient.   Discharge time involved coordination of the discharge process with social worker, nursing staff and therapy department. Medical justification for home health services/DME verified.    Durenda Age, NP [employer] [phone]

## 2016-03-25 ENCOUNTER — Other Ambulatory Visit: Payer: Self-pay | Admitting: *Deleted

## 2016-03-25 DIAGNOSIS — I714 Abdominal aortic aneurysm, without rupture, unspecified: Secondary | ICD-10-CM

## 2016-03-27 ENCOUNTER — Encounter: Payer: Self-pay | Admitting: Vascular Surgery

## 2016-04-02 ENCOUNTER — Ambulatory Visit (INDEPENDENT_AMBULATORY_CARE_PROVIDER_SITE_OTHER): Payer: Medicare Other | Admitting: Orthopaedic Surgery

## 2016-04-02 ENCOUNTER — Encounter (INDEPENDENT_AMBULATORY_CARE_PROVIDER_SITE_OTHER): Payer: Self-pay | Admitting: Orthopaedic Surgery

## 2016-04-02 VITALS — Ht 61.0 in | Wt 94.0 lb

## 2016-04-02 DIAGNOSIS — S6291XD Unspecified fracture of right wrist and hand, subsequent encounter for fracture with routine healing: Secondary | ICD-10-CM

## 2016-04-02 DIAGNOSIS — I2510 Atherosclerotic heart disease of native coronary artery without angina pectoris: Secondary | ICD-10-CM

## 2016-04-02 DIAGNOSIS — S32810D Multiple fractures of pelvis with stable disruption of pelvic ring, subsequent encounter for fracture with routine healing: Secondary | ICD-10-CM

## 2016-04-02 NOTE — Progress Notes (Signed)
Office Visit Note   Patient: Tonya Payne           Date of Birth: 04-29-1935           MRN: [id number] Visit Date: 04/02/2016              Requested by: Rocky Morel, MD [address] [place], [place] [address] PCP: Rocky Morel, MD   Assessment & Plan: Visit Diagnoses:  1. Multiple closed fractures of pelvis with stable disruption of pelvic ring with routine healing, subsequent encounter   2. Closed fracture of right hand with routine healing, subsequent encounter     Plan: Patient needs more physical therapy she is enrolling in a wheelchair she has a healed pelvic fracture and had fracture of the second and fourth metacarpal. Fourth fingers doing well second finger has problems with tightness at the MCP joint and I gave her special exercises to work on flexion of the MCP joint of the index finger.  Follow-Up Instructions: Return if symptoms worsen or fail to improve.   Orders:  No orders of the defined types were placed in this encounter.  No orders of the defined types were placed in this encounter.     Procedures: No procedures performed   Clinical Data: No additional findings.   Subjective: Chief Complaint  Patient presents with  . Pelvis - Fracture, Follow-up  . Right Hand - Fracture, Follow-up    Patient returns for one month recheck on closed fracture of pubic rami on the right as well as closed displaced fracture neck of 2nd metacarpal bone right hand. She has continued with the wrist splint on the right. She has some pain in right index finger, but otherwise no pain in the hand. She denies any pain in her pelvic region. She is in a wheelchair today, but states that she is ready to start using her walker. She has gone back to her residence. She is not taking anything for pain.    Review of Systems  Constitutional: Negative for chills and diaphoresis.  HENT: Negative for ear discharge, ear pain and nosebleeds.   Eyes: Negative for  discharge and visual disturbance.  Respiratory: Negative for cough, choking and shortness of breath.   Cardiovascular: Negative for chest pain and palpitations.       History of heart disease history of atrial fib  Gastrointestinal: Negative for abdominal distention and abdominal pain.  Endocrine: Negative for cold intolerance and heat intolerance.  Genitourinary: Negative for flank pain and hematuria.  Musculoskeletal:       Positive history for falls. She denies any pelvic pain related to her pelvic fractures  Skin: Negative for rash and wound.  Neurological: Negative for seizures and speech difficulty.  Hematological: Negative for adenopathy. Does not bruise/bleed easily.  Psychiatric/Behavioral: Negative for agitation and suicidal ideas.   change in the assistance from 2 months ago when I saw proximal consultation. Of note his previous cardiac history atrial fib and the fractures of the pelvis and hand   Objective: Vital Signs: Ht 5\' 1"  (1.549 m)   Wt 94 lb (42.6 kg)   BMI 17.76 kg/m   Physical Exam  Constitutional: She is oriented to person, place, and time. She appears well-developed.  HENT:  Head: Normocephalic.  Right Ear: External ear normal.  Left Ear: External ear normal.  Eyes: Pupils are equal, round, and reactive to light.  Neck: No tracheal deviation present. No thyromegaly present.  Cardiovascular: Normal rate.   Pulmonary/Chest: Effort  normal.  Abdominal: Soft.  Neurological: She is alert and oriented to person, place, and time.  Skin: Skin is warm and dry.  Psychiatric: She has a normal mood and affect. Her behavior is normal.    Ortho Exam good flexion of all fingers done to distal palmar crease except for index finger lacks 2 cm touching distal palmar crease. She improved about 1 cm from 3 cm touching distal palmar crease just in the office was some exercise and stretching of the MCP and flexion. No tenderness to the pubic rami pelvis. Lower extremity range  of motion is good she has overall decreased strength since she's been in a wheelchair for a couple months most of the time.  Specialty Comments:  No specialty comments available.  Imaging: No results found.   PMFS History: Patient Active Problem List   Diagnosis Date Noted  . Closed fracture of multiple pubic rami, right, sequela 02/27/2016  . Displaced fracture of neck of right second metacarpal bone with routine healing 02/27/2016  . Protein-calorie malnutrition, severe (HCC) 02/22/2016  . Orthostatic dizziness 02/22/2016  . Postoperative atrial fibrillation (HCC) 02/22/2016  . Fall   . Hand fracture   . Pelvic fracture (HCC) 01/26/2016  . AAA (abdominal aortic aneurysm) without rupture (HCC) 11/30/2015  . Post PTCA   . S/P CABG x 3 10/16/2015  . CAD (coronary artery disease), native coronary artery 10/08/2015  . Congestive dilated cardiomyopathy (HCC) 10/08/2015  . Hyperlipidemia LDL goal <70 10/08/2015  . UTI (urinary tract infection) 10/06/2015  . Myocardial infarction involving left anterior descending (LAD) coronary artery (HCC) 10/06/2015   Past Medical History:  Diagnosis Date  . AAA (abdominal aortic aneurysm) without rupture (HCC) 2016   CTA 10/23/2015 - saccular infrarenal aneurysm roughly 4.8 cm in diameter. Stable from 2016 -- Dr. Darrick PennaFields  . Anxiety   . Atrial fibrillation, new onset (HCC) 10/2015   after CABG, in SR at d/c  . CAD (coronary artery disease), native coronary artery 10/08/2015   Severe coronary disease with calcified left main and LAD: 5% left main going into ostial LAD. Mid LAD 95% (treated with PTCA). Proximal RCA 50%, calcified. EF was 20-25% with apical akinesis and distal anterior hypokinesis. --> Referred for CABG  . Congestive dilated cardiomyopathy (HCC) 10/08/2015   Intra-Op TEE showed EF of 40 and 45%. She had an echo with a "normal LV function "documented, but there was no reading M.D.  . COPD (chronic obstructive pulmonary disease) (HCC)     . Depression   . History of acute anterior wall MI 10/08/2015   Presented with severe chest pain and dynamic anterior ST elevations/biphasic elevations area did not meet full criteria for STEMI, and troponin was not dramatically elevated. 95% LAD treated with PTCA followed by CABG  . Hyperlipidemia LDL goal <70 10/08/2015  . IBS (irritable bowel syndrome)   . Oral thrush   . Post PTCA 10/08/2015   Presented with STEMI, severe mid LAD lesion treated with PTCA and then referred for CABG.  . TIA (transient ischemic attack)     Family History  Problem Relation Age of Onset  . Stroke Mother     dead  . Clotting disorder Father     dead  bone marrow disease    Past Surgical History:  Procedure Laterality Date  . BACK SURGERY    . CARDIAC CATHETERIZATION N/A 10/06/2015   Procedure: Left Heart Cath and Coronary Angiography;  Surgeon: Marykay Lexavid W Harding, MD;  [facility];  Service: Cardiovascular;: Heavily calcified left main-LAD. 75% LM & Ost LAD, 95% d-mLAD, p-mRCA Calcified 50%. EF ~20-25%  . CARDIAC CATHETERIZATION N/A 10/06/2015   Procedure: Coronary Balloon Angioplasty;  Surgeon: Marykay Lexavid W Harding, MD;  [facility];  Service: Cardiovascular: PTCA of 95% LAD in setting of STEMI - reduced to 70% --> would need Atherectomy-PCI vs. CABG.  Sent for CABG.  . COLECTOMY    . CORONARY ARTERY BYPASS GRAFT N/A 10/16/2015   Procedure: CORONARY ARTERY BYPASS GRAFTING (CABG)  x three, using left internal mammary artery and right leg greater saphenous vein harvested endoscopically;  Surgeon: Loreli SlotSteven C Hendrickson, MD;  [facility];  Service: Open Heart Surgery;  Laterality: N/A;  . NECK SURGERY    . TEE WITHOUT CARDIOVERSION N/A 10/16/2015   Procedure: TRANSESOPHAGEAL ECHOCARDIOGRAM (TEE);  Surgeon: Loreli SlotSteven C Hendrickson, MD;  [facility];  Service: Open Heart Surgery - IntraOp:  EF 40-45%.   Social History   Occupational History  . Not on file.   Social History Main  Topics  . Smoking status: Former Smoker    Packs/day: 0.50    Types: Cigarettes    Quit date: 04/11/2015  . Smokeless tobacco: Never Used  . Alcohol use 1.2 oz/week    2 Glasses of wine per week  . Drug use: No  . Sexual activity: No

## 2016-04-03 ENCOUNTER — Other Ambulatory Visit (HOSPITAL_COMMUNITY): Payer: Medicare Other

## 2016-04-04 ENCOUNTER — Ambulatory Visit: Payer: Medicare Other | Admitting: Vascular Surgery

## 2016-04-09 ENCOUNTER — Ambulatory Visit (INDEPENDENT_AMBULATORY_CARE_PROVIDER_SITE_OTHER): Payer: Medicare Other | Admitting: Orthopaedic Surgery

## 2016-04-09 ENCOUNTER — Encounter (INDEPENDENT_AMBULATORY_CARE_PROVIDER_SITE_OTHER): Payer: Self-pay | Admitting: Orthopaedic Surgery

## 2016-04-09 VITALS — BP 117/85 | HR 89 | Ht 61.0 in | Wt 96.0 lb

## 2016-04-09 DIAGNOSIS — S6291XD Unspecified fracture of right wrist and hand, subsequent encounter for fracture with routine healing: Secondary | ICD-10-CM

## 2016-04-09 DIAGNOSIS — S32810D Multiple fractures of pelvis with stable disruption of pelvic ring, subsequent encounter for fracture with routine healing: Secondary | ICD-10-CM

## 2016-04-09 NOTE — Progress Notes (Signed)
Office Visit Note   Patient: Tonya Payne           Date of Birth: [birth year]           MRN: [id number] Visit Date: 04/09/2016 Requested by: Rocky Morelobert Rostand, MD [id number] [place], [place] [id number] PCP: Rocky MorelOSTAND, ROBERT, MD  Subjective: Chief Complaint  Patient presents with  . Pelvis - Follow-up  . Right Hand - Fracture, Follow-up    Tonya Payne returns in follow up for multiple pelvis fractures and right hand fracture.  She is doing well, not walking much though.  Right hand is a little better, she massages it.  No pain meds for either of these.  [facility], but no PT.  She is requesting PT at home if possible.                 Review of Systems 14 point review systems updated and is unchanged from several weeks ago with her fall and fractures. She is staying in a group home assisted living Assessment & Plan: Visit Diagnoses:  1. Multiple closed fractures of pelvis with stable disruption of pelvic ring with routine healing, subsequent encounter   2. Closed fracture of right hand with routine healing, subsequent encounter     Plan: She is walking with the rolling walker minimally. We'll set up physical therapy for stamina strengthening she states at times she gets up she is concerned she may fall with the balance issues and some weakness. Physical therapy for weightbearing as tolerated, and lower extremity strengthening. Recheck 1 month  Follow-Up Instructions: Return in about 1 month (around 05/10/2016).   Orders:  No orders of the defined types were placed in this encounter.  No orders of the defined types were placed in this encounter.     Procedures: No procedures performed   Clinical Data: No additional findings.  Objective: Vital Signs: BP 117/85   Pulse 89   Ht 5\' 1"  (1.549 m)   Wt 96 lb (43.5 kg)   BMI 18.14 kg/m   Physical Exam no tenderness to palpation over the pelvis. Metacarpal fractures are doing well  except for the index which has still lacked about 20 flexion we gave her additional exercises to work on flexion of the index MCP. Previous x-rays were reviewed with patient which has healed metacarpal fractures. No limitation of The DIP joint motion.  Ortho Exam  Specialty Comments:  No specialty comments available.  Imaging: No results found.   PMFS History: Patient Active Problem List   Diagnosis Date Noted  . Closed fracture of multiple pubic rami, right, sequela 02/27/2016  . Displaced fracture of neck of right second metacarpal bone with routine healing 02/27/2016  . Protein-calorie malnutrition, severe (HCC) 02/22/2016  . Orthostatic dizziness 02/22/2016  . Postoperative atrial fibrillation (HCC) 02/22/2016  . Fall   . Hand fracture   . Pelvic fracture (HCC) 01/26/2016  . AAA (abdominal aortic aneurysm) without rupture (HCC) 11/30/2015  . Post PTCA   . S/P CABG x 3 10/16/2015  . CAD (coronary artery disease), native coronary artery 10/08/2015  . Congestive dilated cardiomyopathy (HCC) 10/08/2015  . Hyperlipidemia LDL goal <70 10/08/2015  . UTI (urinary tract infection) 10/06/2015  . Myocardial infarction involving left anterior descending (LAD) coronary artery (HCC) 10/06/2015   Past Medical History:  Diagnosis Date  . AAA (abdominal aortic aneurysm) without rupture (HCC) 2016   CTA 10/23/2015 - saccular infrarenal aneurysm roughly 4.8  cm in diameter. Stable from 2016 -- Dr. Darrick PennaFields  . Anxiety   . Atrial fibrillation, new onset (HCC) 10/2015   after CABG, in SR at d/c  . CAD (coronary artery disease), native coronary artery 10/08/2015   Severe coronary disease with calcified left main and LAD: 5% left main going into ostial LAD. Mid LAD 95% (treated with PTCA). Proximal RCA 50%, calcified. EF was 20-25% with apical akinesis and distal anterior hypokinesis. --> Referred for CABG  . Congestive dilated cardiomyopathy (HCC) 10/08/2015   Intra-Op TEE showed EF of 40 and 45%.  She had an echo with a "normal LV function "documented, but there was no reading M.D.  . COPD (chronic obstructive pulmonary disease) (HCC)   . Depression   . History of acute anterior wall MI 10/08/2015   Presented with severe chest pain and dynamic anterior ST elevations/biphasic elevations area did not meet full criteria for STEMI, and troponin was not dramatically elevated. 95% LAD treated with PTCA followed by CABG  . Hyperlipidemia LDL goal <70 10/08/2015  . IBS (irritable bowel syndrome)   . Oral thrush   . Post PTCA 10/08/2015   Presented with STEMI, severe mid LAD lesion treated with PTCA and then referred for CABG.  . TIA (transient ischemic attack)     Family History  Problem Relation Age of Onset  . Stroke Mother     dead  . Clotting disorder Father     dead  bone marrow disease    Past Surgical History:  Procedure Laterality Date  . BACK SURGERY    . CARDIAC CATHETERIZATION N/A 10/06/2015   Procedure: Left Heart Cath and Coronary Angiography;  Surgeon: Marykay Lexavid W Harding, MD;  [facility];  Service: Cardiovascular;: Heavily calcified left main-LAD. 75% LM & Ost LAD, 95% d-mLAD, p-mRCA Calcified 50%. EF ~20-25%  . CARDIAC CATHETERIZATION N/A 10/06/2015   Procedure: Coronary Balloon Angioplasty;  Surgeon: Marykay Lexavid W Harding, MD;  [facility];  Service: Cardiovascular: PTCA of 95% LAD in setting of STEMI - reduced to 70% --> would need Atherectomy-PCI vs. CABG.  Sent for CABG.  . COLECTOMY    . CORONARY ARTERY BYPASS GRAFT N/A 10/16/2015   Procedure: CORONARY ARTERY BYPASS GRAFTING (CABG)  x three, using left internal mammary artery and right leg greater saphenous vein harvested endoscopically;  Surgeon: Loreli SlotSteven C Hendrickson, MD;  [facility];  Service: Open Heart Surgery;  Laterality: N/A;  . NECK SURGERY    . TEE WITHOUT CARDIOVERSION N/A 10/16/2015   Procedure: TRANSESOPHAGEAL ECHOCARDIOGRAM (TEE);  Surgeon: Loreli SlotSteven C Hendrickson, MD;  [facility];  Service: Open Heart Surgery - IntraOp:  EF 40-45%.   Social History   Occupational History  . Not on file.   Social History Main Topics  . Smoking status: Former Smoker    Packs/day: 0.50    Types: Cigarettes    Quit date: 04/11/2015  . Smokeless tobacco: Never Used  . Alcohol use 1.2 oz/week    2 Glasses of wine per week  . Drug use: No  . Sexual activity: No

## 2016-05-23 ENCOUNTER — Ambulatory Visit: Payer: Medicare Other | Admitting: Cardiology

## 2016-06-20 ENCOUNTER — Ambulatory Visit (HOSPITAL_COMMUNITY): Payer: Medicare Other

## 2016-06-20 ENCOUNTER — Ambulatory Visit: Payer: Medicare Other | Admitting: Vascular Surgery

## 2016-06-25 ENCOUNTER — Encounter: Payer: Self-pay | Admitting: Cardiology

## 2016-06-25 ENCOUNTER — Ambulatory Visit (INDEPENDENT_AMBULATORY_CARE_PROVIDER_SITE_OTHER): Payer: Medicare Other | Admitting: Cardiology

## 2016-06-25 VITALS — BP 100/84 | HR 106 | Ht 60.0 in | Wt 109.0 lb

## 2016-06-25 DIAGNOSIS — I2102 ST elevation (STEMI) myocardial infarction involving left anterior descending coronary artery: Secondary | ICD-10-CM

## 2016-06-25 DIAGNOSIS — E785 Hyperlipidemia, unspecified: Secondary | ICD-10-CM

## 2016-06-25 DIAGNOSIS — I25119 Atherosclerotic heart disease of native coronary artery with unspecified angina pectoris: Secondary | ICD-10-CM | POA: Diagnosis not present

## 2016-06-25 DIAGNOSIS — I420 Dilated cardiomyopathy: Secondary | ICD-10-CM | POA: Diagnosis not present

## 2016-06-25 DIAGNOSIS — Z951 Presence of aortocoronary bypass graft: Secondary | ICD-10-CM | POA: Diagnosis not present

## 2016-06-25 DIAGNOSIS — R42 Dizziness and giddiness: Secondary | ICD-10-CM

## 2016-06-25 DIAGNOSIS — I714 Abdominal aortic aneurysm, without rupture, unspecified: Secondary | ICD-10-CM

## 2016-06-25 DIAGNOSIS — I4891 Unspecified atrial fibrillation: Secondary | ICD-10-CM | POA: Diagnosis not present

## 2016-06-25 DIAGNOSIS — I9789 Other postprocedural complications and disorders of the circulatory system, not elsewhere classified: Secondary | ICD-10-CM | POA: Diagnosis not present

## 2016-06-25 NOTE — Progress Notes (Signed)
PCP: Rocky Morel, MD  Clinic Note: Chief Complaint  Patient presents with  . Follow-up    would like a refill on Meclizine   . Coronary Artery Disease    s/p cabg    HPI: Tonya Payne is a 82 y.o. female with a PMH below who presents today for Four-month follow-up for CAD-CABG after a STEMI/non-STEMI with PTCA of the LAD followed by CABG. She has a history of hyperlipidemia, AAA, COPD TIA IBS and anxiety along with tobacco abuse. D/c 06/26 after admit for STEMI>>abnl cath>>CABG w/ LIMA-LAD, SVG-D1, SVG-RCA, EF 20-25% by cath 06/02, normal by echo 06/05. She had post-op afib but was in sinus rhythm at discharge.  Kamala Kolton was last seen on 02/20/2016. She was recovering from a mechanical fall where she fractured bilateral suprapubic rami and wrist.  She was referred to a Nephrologist by her PCP for some worsening renal function (possible amyloid nephropathy). Because of hypertension that point, Amlodipine (BP was 164/84) was added.   Recent Hospitalizations: None  Studies Reviewed: n/a  Interval History: [name] presents today doing okay. She is gradually building up some strength but his not doing a lot of activity. She is able to walk down to her dieting all 3 meals a day but otherwise has a lot of TV watching. Not very active. She is starting to gain some weight with improved diet.  She denies any sensation of rapid or irregular heartbeats or palpitations despite having sinus tachycardia today. She does have some positional dizziness but it doesn't sound like orthostatic.  No chest pain or shortness of breath with rest or exertion. No PND, orthopnea or edema.  No syncope/near syncope. No TIA/amaurosis fugax symptoms. No claudication.  ROS: A comprehensive was performed. Review of Systems  Constitutional: Negative for malaise/fatigue (Gaining energy level).  HENT: Negative for congestion and nosebleeds.   Respiratory: Negative for shortness of breath.   Cardiovascular:        Per history of present illness  Gastrointestinal: Negative for blood in stool, constipation, heartburn and melena.  Genitourinary: Negative for hematuria.  Musculoskeletal: Positive for joint pain (Still has hip pain from her injury).  Neurological: Positive for dizziness. Negative for focal weakness.  Psychiatric/Behavioral: Positive for memory loss. Negative for depression. The patient is nervous/anxious. The patient does not have insomnia.   All other systems reviewed and are negative.   Past Medical History:  Diagnosis Date  . AAA (abdominal aortic aneurysm) without rupture (HCC) 2016   CTA 10/23/2015 - saccular infrarenal aneurysm roughly 4.8 cm in diameter. Stable from 2016 -- Dr. Darrick Penna  . Anxiety   . Atrial fibrillation, new onset (HCC) 10/2015   after CABG, in SR at d/c  . CAD (coronary artery disease), native coronary artery 10/08/2015   Severe coronary disease with calcified left main and LAD: 5% left main going into ostial LAD. Mid LAD 95% (treated with PTCA). Proximal RCA 50%, calcified. EF was 20-25% with apical akinesis and distal anterior hypokinesis. --> Referred for CABG  . Congestive dilated cardiomyopathy (HCC) 10/08/2015   Intra-Op TEE showed EF of 40 and 45%. She had an echo with a "normal LV function "documented, but there was no reading M.D.  . COPD (chronic obstructive pulmonary disease) (HCC)   . Depression   . History of acute anterior wall MI 10/08/2015   Presented with severe chest pain and dynamic anterior ST elevations/biphasic elevations area did not meet full criteria for STEMI, and troponin was not dramatically elevated. 95% LAD treated  with PTCA followed by CABG  . Hyperlipidemia LDL goal <70 10/08/2015  . IBS (irritable bowel syndrome)   . Oral thrush   . Post PTCA 10/08/2015   Presented with STEMI, severe mid LAD lesion treated with PTCA and then referred for CABG.  . TIA (transient ischemic attack)     Past Surgical History:  Procedure  Laterality Date  . BACK SURGERY    . CARDIAC CATHETERIZATION N/A 10/06/2015   Procedure: Left Heart Cath and Coronary Angiography;  Surgeon: Marykay Lex, MD;  Location: [facility] INVASIVE CV LAB;  Service: Cardiovascular;: Heavily calcified left main-LAD. 75% LM & Ost LAD, 95% d-mLAD, p-mRCA Calcified 50%. EF ~20-25%  . CARDIAC CATHETERIZATION N/A 10/06/2015   Procedure: Coronary Balloon Angioplasty;  Surgeon: Marykay Lex, MD;  Location: [facility] INVASIVE CV LAB;  Service: Cardiovascular: PTCA of 95% LAD in setting of STEMI - reduced to 70% --> would need Atherectomy-PCI vs. CABG.  Sent for CABG.  . COLECTOMY    . CORONARY ARTERY BYPASS GRAFT N/A 10/16/2015   Procedure: CORONARY ARTERY BYPASS GRAFTING (CABG)  x three, using left internal mammary artery and right leg greater saphenous vein harvested endoscopically;  Surgeon: Loreli Slot, MD;  Location: [facility] OR;  Service: Open Heart Surgery;  Laterality: N/A;  . NECK SURGERY    . TEE WITHOUT CARDIOVERSION N/A 10/16/2015   Procedure: TRANSESOPHAGEAL ECHOCARDIOGRAM (TEE);  Surgeon: Loreli Slot, MD;  Location: [facility] OR;  Service: Open Heart Surgery - IntraOp:  EF 40-45%.    Current Meds  Medication Sig  . amLODipine (NORVASC) 2.5 MG tablet Take 1 tablet by mouth daily.  Marland Kitchen atorvastatin (LIPITOR) 40 MG tablet Take 1 tablet (40 mg total) by mouth daily at 6 PM.  . fexofenadine (ALLEGRA) 180 MG tablet Take 180 mg by mouth daily as needed for allergies or rhinitis.  Marland Kitchen OLANZapine (ZYPREXA) 5 MG tablet Take 1 tablet by mouth at bedtime.  . sertraline (ZOLOFT) 50 MG tablet Take 50 mg by mouth every morning.     No Known Allergies  Social History   Social History  . Marital status: Widowed    Spouse name: N/A  . Number of children: N/A  . Years of education: N/A   Social History Main Topics  . Smoking status: Former Smoker    Packs/day: 0.50    Types: Cigarettes    Quit date: 04/11/2015  . Smokeless tobacco: Never Used  . Alcohol use 1.2  oz/week    2 Glasses of wine per week  . Drug use: No  . Sexual activity: No   Other Topics Concern  . None   Social History Narrative  . None    family history includes Clotting disorder in her father; Stroke in her mother.  Wt Readings from Last 3 Encounters:  06/25/16 49.4 kg (109 lb)  04/09/16 43.5 kg (96 lb)  04/02/16 42.6 kg (94 lb)    PHYSICAL EXAM BP 100/84   Pulse (!) 106   Ht 5' (1.524 m)   Wt 49.4 kg (109 lb)   BMI 21.29 kg/m  General appearance: Thin, frail elderly woman who appears older than her stated age. She appears less debilitated today, and is using a rolling walker. She definitely looks more nourished today than before. Neck: no adenopathy, no carotid bruit and no JVD Lungs: clear to auscultation bilaterally, normal percussion bilaterally and non-labored Heart: regular rate and rhythm, S1 & S2 normal, no murmur, click, rub or gallop; non-displaced PMI. Abdomen: soft,  non-tender; bowel sounds normal; no masses,  no organomegaly; No HHJR Extremities: extremities normal, atraumatic, no cyanosis, or edema  Pulses: 2+ and symmetric;  Skin: mobility and turgor normal, no evidence of bleeding or bruising and no lesions noted Neurologic: Mental status: Alert, oriented, thought content appropriate; but poor historian Cranial nerves: normal (II-XII grossly intact)    Adult ECG Report  Rate: 106 ;  Rhythm: sinus tachycardia and Biatrial enlargement. Otherwise normal axis, intervals and durations;   Narrative Interpretation: Otherwise stable EKG besides sinus tachycardia   Other studies Reviewed: Additional studies/ records that were reviewed today include:  Recent Labs:   Lab Results  Component Value Date   CHOL 200 10/07/2015   HDL 85 10/07/2015   LDLCALC 93 10/07/2015   TRIG 112 10/07/2015   CHOLHDL 2.4 10/07/2015   Lab Results  Component Value Date   CREATININE 1.3 (A) 03/08/2016   BUN 20 03/08/2016   NA 134 (A) 03/08/2016   K 4.2 03/08/2016    CL 104 01/29/2016   CO2 20 (L) 01/29/2016    ASSESSMENT / PLAN: Problem List Items Addressed This Visit    AAA (abdominal aortic aneurysm) without rupture (HCC) (Chronic)   Relevant Medications   amLODipine (NORVASC) 2.5 MG tablet   Other Relevant Orders   EKG 12-Lead (Completed)   Congestive dilated cardiomyopathy (HCC) (Chronic)    No active heart failure symptoms. Plan to recheck echocardiogram this summer      Relevant Medications   amLODipine (NORVASC) 2.5 MG tablet   Other Relevant Orders   EKG 12-Lead (Completed)   Coronary artery disease involving native coronary artery with angina pectoris (HCC) - Primary (Chronic)    Pretty significant calcified CAD. But is now status post CABG and seems to be doing okay with no active angina symptoms. She was on aspirin, but she is not taking that because she was told by her nephrologist not to take NSAIDs. I have asked that she discuss this with her nephrologist and see if she can actually be on a baby dose aspirin. In the past she had been on a beta blocker, but that was stopped and now she is started on amlodipine because of hypertension. I would like to in light of her sinus tachycardia try to restart Lopressor at least 12.5 twice a day as opposed to the amlodipine. She is on atorvastatin      Relevant Medications   amLODipine (NORVASC) 2.5 MG tablet   Hyperlipidemia LDL goal <70 (Chronic)    Continue on atorvastatin. Plan to recheck labs this summer      Relevant Medications   amLODipine (NORVASC) 2.5 MG tablet   Other Relevant Orders   EKG 12-Lead (Completed)   Myocardial infarction involving left anterior descending (LAD) coronary artery (HCC) (Chronic)    Although she had multivessel CAD, her culprit lesion was clearly the LAD treated with PTCA prior to her CABG. no further angina symptoms. Hard to tell as she is having significant exertional dyspnea because she doesn't do much. But she denies any resting heart failure  symptoms. Intra-Op TEE showed improved EF. I would like to get a repeat echocardiogram probably this summer as she is now been on medications. I don't do while she is still having some sinus tachycardia and we are titrating medications.      Relevant Medications   amLODipine (NORVASC) 2.5 MG tablet   Other Relevant Orders   EKG 12-Lead (Completed)   Orthostatic dizziness (Chronic)   Relevant Orders  EKG 12-Lead (Completed)   Postoperative atrial fibrillation (HCC)    Signs of recurrent A. fib. But for this reason I would also like for her to be back on beta blocker.  Unless she has signs of recurrence, would hold off on doing any anticoagulation because of her high risk for falling.      Relevant Medications   amLODipine (NORVASC) 2.5 MG tablet   S/P CABG x 3 (Chronic)   Relevant Orders   EKG 12-Lead (Completed)      Current medicines are reviewed at length with the patient today. (+/- concerns) N/A The following changes have been made: ? Convert from Amlodipine to Metoprolol  Patient Instructions  POSSIBLE MEDICATION CHANGE  Please ask kidney doctor next week - to look at Dr [place] office notes from today  QUESTION   1 Will it be okay for you to start ASPIRIN 81 MG. 2 Can you change Amlodipine to Lopressor 12.5 mg twice a day.- ( rate control and hx CABG)   Your physician recommends that you schedule a follow-up appointment in June OR July 2018 WITH DR [facility].   If you need a refill on your cardiac medications before your next appointment, please call your pharmacy.    Studies Ordered:   Orders Placed This Encounter  Procedures  . EKG 12-Lead      Bryan Lemma, M.D., M.S. Interventional Cardiologist   Pager # [phone] Phone # [phone] [address] [place], [place] [address]

## 2016-06-25 NOTE — Patient Instructions (Addendum)
POSSIBLE MEDICATION CHANGE  Please ask kidney doctor next week - [facility]  QUESTION   1 Will it be okay for you to start ASPIRIN 81 MG. 2 Can you change Amlodipine to Lopressor 12.5 mg twice a day.- ( rate control and hx CABG)   Your physician recommends that you schedule a follow-up appointment in June OR July 2018 WITH DR [facility].   If you need a refill on your cardiac medications before your next appointment, please call your pharmacy.

## 2016-06-27 ENCOUNTER — Encounter: Payer: Self-pay | Admitting: Cardiology

## 2016-06-27 NOTE — Assessment & Plan Note (Addendum)
Although she had multivessel CAD, her culprit lesion was clearly the LAD treated with PTCA prior to her CABG. no further angina symptoms. Hard to tell as she is having significant exertional dyspnea because she doesn't do much. But she denies any resting heart failure symptoms. Intra-Op TEE showed improved EF. I would like to get a repeat echocardiogram probably this summer as she is now been on medications. I don't do while she is still having some sinus tachycardia and we are titrating medications.

## 2016-06-27 NOTE — Assessment & Plan Note (Signed)
Continue on atorvastatin. Plan to recheck labs this summer

## 2016-06-27 NOTE — Assessment & Plan Note (Addendum)
Pretty significant calcified CAD. But is now status post CABG and seems to be doing okay with no active angina symptoms. She was on aspirin, but she is not taking that because she was told by her nephrologist not to take NSAIDs. I have asked that she discuss this with her nephrologist and see if she can actually be on a baby dose aspirin. In the past she had been on a beta blocker, but that was stopped and now she is started on amlodipine because of hypertension. I would like to in light of her sinus tachycardia try to restart Lopressor at least 12.5 twice a day as opposed to the amlodipine. She is on atorvastatin

## 2016-06-27 NOTE — Assessment & Plan Note (Signed)
Signs of recurrent A. fib. But for this reason I would also like for her to be back on beta blocker.  Unless she has signs of recurrence, would hold off on doing any anticoagulation because of her high risk for falling.

## 2016-06-27 NOTE — Assessment & Plan Note (Signed)
No active heart failure symptoms. Plan to recheck echocardiogram this summer

## 2016-09-23 ENCOUNTER — Encounter (HOSPITAL_COMMUNITY): Payer: Self-pay

## 2016-09-23 ENCOUNTER — Emergency Department (HOSPITAL_COMMUNITY): Payer: Medicare Other

## 2016-09-23 ENCOUNTER — Inpatient Hospital Stay (HOSPITAL_COMMUNITY)
Admission: EM | Admit: 2016-09-23 | Discharge: 2016-09-26 | DRG: 481 | Disposition: A | Discharge status: Skilled Nursing Facility | Payer: Medicare Other | Attending: Internal Medicine | Admitting: Internal Medicine

## 2016-09-23 DIAGNOSIS — R52 Pain, unspecified: Secondary | ICD-10-CM | POA: Diagnosis not present

## 2016-09-23 DIAGNOSIS — I714 Abdominal aortic aneurysm, without rupture, unspecified: Secondary | ICD-10-CM | POA: Diagnosis present

## 2016-09-23 DIAGNOSIS — S72002A Fracture of unspecified part of neck of left femur, initial encounter for closed fracture: Secondary | ICD-10-CM | POA: Diagnosis not present

## 2016-09-23 DIAGNOSIS — I129 Hypertensive chronic kidney disease with stage 1 through stage 4 chronic kidney disease, or unspecified chronic kidney disease: Secondary | ICD-10-CM | POA: Diagnosis present

## 2016-09-23 DIAGNOSIS — I252 Old myocardial infarction: Secondary | ICD-10-CM | POA: Diagnosis not present

## 2016-09-23 DIAGNOSIS — Z823 Family history of stroke: Secondary | ICD-10-CM | POA: Diagnosis not present

## 2016-09-23 DIAGNOSIS — I10 Essential (primary) hypertension: Secondary | ICD-10-CM | POA: Diagnosis not present

## 2016-09-23 DIAGNOSIS — I25119 Atherosclerotic heart disease of native coronary artery with unspecified angina pectoris: Secondary | ICD-10-CM | POA: Diagnosis present

## 2016-09-23 DIAGNOSIS — I739 Peripheral vascular disease, unspecified: Secondary | ICD-10-CM | POA: Diagnosis present

## 2016-09-23 DIAGNOSIS — D62 Acute posthemorrhagic anemia: Secondary | ICD-10-CM | POA: Diagnosis not present

## 2016-09-23 DIAGNOSIS — Z79899 Other long term (current) drug therapy: Secondary | ICD-10-CM

## 2016-09-23 DIAGNOSIS — R000 Tachycardia, unspecified: Secondary | ICD-10-CM | POA: Diagnosis present

## 2016-09-23 DIAGNOSIS — F329 Major depressive disorder, single episode, unspecified: Secondary | ICD-10-CM | POA: Diagnosis present

## 2016-09-23 DIAGNOSIS — Z8679 Personal history of other diseases of the circulatory system: Secondary | ICD-10-CM

## 2016-09-23 DIAGNOSIS — N183 Chronic kidney disease, stage 3 unspecified: Secondary | ICD-10-CM

## 2016-09-23 DIAGNOSIS — Z951 Presence of aortocoronary bypass graft: Secondary | ICD-10-CM | POA: Diagnosis not present

## 2016-09-23 DIAGNOSIS — Z832 Family history of diseases of the blood and blood-forming organs and certain disorders involving the immune mechanism: Secondary | ICD-10-CM

## 2016-09-23 DIAGNOSIS — J449 Chronic obstructive pulmonary disease, unspecified: Secondary | ICD-10-CM | POA: Diagnosis present

## 2016-09-23 DIAGNOSIS — S72012A Unspecified intracapsular fracture of left femur, initial encounter for closed fracture: Principal | ICD-10-CM | POA: Diagnosis present

## 2016-09-23 DIAGNOSIS — D72829 Elevated white blood cell count, unspecified: Secondary | ICD-10-CM | POA: Diagnosis present

## 2016-09-23 DIAGNOSIS — E871 Hypo-osmolality and hyponatremia: Secondary | ICD-10-CM | POA: Diagnosis present

## 2016-09-23 DIAGNOSIS — S72002D Fracture of unspecified part of neck of left femur, subsequent encounter for closed fracture with routine healing: Secondary | ICD-10-CM | POA: Diagnosis present

## 2016-09-23 DIAGNOSIS — Z87891 Personal history of nicotine dependence: Secondary | ICD-10-CM

## 2016-09-23 DIAGNOSIS — Z8673 Personal history of transient ischemic attack (TIA), and cerebral infarction without residual deficits: Secondary | ICD-10-CM | POA: Diagnosis not present

## 2016-09-23 DIAGNOSIS — E785 Hyperlipidemia, unspecified: Secondary | ICD-10-CM | POA: Diagnosis present

## 2016-09-23 DIAGNOSIS — M810 Age-related osteoporosis without current pathological fracture: Secondary | ICD-10-CM | POA: Diagnosis present

## 2016-09-23 DIAGNOSIS — Y92000 Kitchen of unspecified non-institutional (private) residence as  the place of occurrence of the external cause: Secondary | ICD-10-CM

## 2016-09-23 DIAGNOSIS — W010XXA Fall on same level from slipping, tripping and stumbling without subsequent striking against object, initial encounter: Secondary | ICD-10-CM | POA: Diagnosis present

## 2016-09-23 DIAGNOSIS — Z955 Presence of coronary angioplasty implant and graft: Secondary | ICD-10-CM | POA: Diagnosis not present

## 2016-09-23 DIAGNOSIS — I4891 Unspecified atrial fibrillation: Secondary | ICD-10-CM | POA: Diagnosis present

## 2016-09-23 DIAGNOSIS — Z419 Encounter for procedure for purposes other than remedying health state, unspecified: Secondary | ICD-10-CM

## 2016-09-23 LAB — CBC WITH DIFFERENTIAL/PLATELET
BASOS ABS: 0 10*3/uL (ref 0.0–0.1)
BASOS PCT: 0 %
EOS ABS: 0.1 10*3/uL (ref 0.0–0.7)
Eosinophils Relative: 1 %
HEMATOCRIT: 36.6 % (ref 36.0–46.0)
Hemoglobin: 12.7 g/dL (ref 12.0–15.0)
Lymphocytes Relative: 11 %
Lymphs Abs: 1 10*3/uL (ref 0.7–4.0)
MCH: 32.3 pg (ref 26.0–34.0)
MCHC: 34.7 g/dL (ref 30.0–36.0)
MCV: 93.1 fL (ref 78.0–100.0)
MONO ABS: 1.1 10*3/uL — AB (ref 0.1–1.0)
MONOS PCT: 12 %
NEUTROS ABS: 7.2 10*3/uL (ref 1.7–7.7)
NEUTROS PCT: 76 %
Platelets: 244 10*3/uL (ref 150–400)
RBC: 3.93 MIL/uL (ref 3.87–5.11)
RDW: 13.7 % (ref 11.5–15.5)
WBC: 9.4 10*3/uL (ref 4.0–10.5)

## 2016-09-23 LAB — COMPREHENSIVE METABOLIC PANEL
ALBUMIN: 4.2 g/dL (ref 3.5–5.0)
ALT: 21 U/L (ref 14–54)
ANION GAP: 12 (ref 5–15)
AST: 31 U/L (ref 15–41)
Alkaline Phosphatase: 120 U/L (ref 38–126)
BUN: 19 mg/dL (ref 6–20)
CHLORIDE: 95 mmol/L — AB (ref 101–111)
CO2: 22 mmol/L (ref 22–32)
Calcium: 9.4 mg/dL (ref 8.9–10.3)
Creatinine, Ser: 1.47 mg/dL — ABNORMAL HIGH (ref 0.44–1.00)
GFR calc Af Amer: 38 mL/min — ABNORMAL LOW (ref >60)
GFR calc non Af Amer: 32 mL/min — ABNORMAL LOW (ref >60)
GLUCOSE: 104 mg/dL — AB (ref 65–99)
POTASSIUM: 4.3 mmol/L (ref 3.5–5.1)
SODIUM: 129 mmol/L — AB (ref 135–145)
TOTAL PROTEIN: 7.6 g/dL (ref 6.5–8.1)
Total Bilirubin: 0.8 mg/dL (ref 0.3–1.2)

## 2016-09-23 LAB — I-STAT TROPONIN, ED: Troponin i, poc: 0.01 ng/mL (ref 0.00–0.08)

## 2016-09-23 MED ORDER — MORPHINE SULFATE (PF) 4 MG/ML IV SOLN
4.0000 mg | Freq: Once | INTRAVENOUS | Status: AC
  Administered 2016-09-23: 4 mg via INTRAVENOUS
  Filled 2016-09-23: qty 1

## 2016-09-23 MED ORDER — ONDANSETRON HCL 4 MG/2ML IJ SOLN
4.0000 mg | Freq: Once | INTRAMUSCULAR | Status: AC
  Administered 2016-09-23: 23:00:00 4 mg via INTRAVENOUS
  Filled 2016-09-23: qty 2

## 2016-09-23 MED ORDER — SODIUM CHLORIDE 0.9 % IV BOLUS (SEPSIS)
1000.0000 mL | Freq: Once | INTRAVENOUS | Status: AC
  Administered 2016-09-23: 1000 mL via INTRAVENOUS

## 2016-09-23 MED ORDER — LORATADINE 10 MG PO TABS
10.0000 mg | ORAL_TABLET | Freq: Every day | ORAL | Status: DC
  Administered 2016-09-24 – 2016-09-26 (×3): 10 mg via ORAL
  Filled 2016-09-23 (×3): qty 1

## 2016-09-23 MED ORDER — MORPHINE SULFATE (PF) 4 MG/ML IV SOLN
0.5000 mg | INTRAVENOUS | Status: DC | PRN
  Administered 2016-09-24 – 2016-09-25 (×4): 0.52 mg via INTRAVENOUS
  Filled 2016-09-23 (×4): qty 1

## 2016-09-23 MED ORDER — METOPROLOL TARTRATE 25 MG PO TABS
12.5000 mg | ORAL_TABLET | Freq: Two times a day (BID) | ORAL | Status: DC
Start: 2016-09-23 — End: 2016-09-27
  Administered 2016-09-23 – 2016-09-26 (×6): 12.5 mg via ORAL
  Filled 2016-09-23 (×6): qty 1

## 2016-09-23 MED ORDER — HYDRALAZINE HCL 20 MG/ML IJ SOLN
10.0000 mg | Freq: Once | INTRAMUSCULAR | Status: AC
  Administered 2016-09-23: 10 mg via INTRAVENOUS
  Filled 2016-09-23: qty 1

## 2016-09-23 MED ORDER — SERTRALINE HCL 50 MG PO TABS
50.0000 mg | ORAL_TABLET | Freq: Every morning | ORAL | Status: DC
  Administered 2016-09-24 – 2016-09-26 (×3): 50 mg via ORAL
  Filled 2016-09-23 (×3): qty 1

## 2016-09-23 MED ORDER — OLANZAPINE 5 MG PO TABS
5.0000 mg | ORAL_TABLET | Freq: Every day | ORAL | Status: DC
  Administered 2016-09-23 – 2016-09-25 (×3): 5 mg via ORAL
  Filled 2016-09-23 (×3): qty 1

## 2016-09-23 MED ORDER — MORPHINE SULFATE (PF) 4 MG/ML IV SOLN
1.0000 mg | Freq: Once | INTRAVENOUS | Status: AC
  Administered 2016-09-23: 23:00:00 1 mg via INTRAVENOUS
  Filled 2016-09-23: qty 1

## 2016-09-23 MED ORDER — ATORVASTATIN CALCIUM 40 MG PO TABS
40.0000 mg | ORAL_TABLET | Freq: Every day | ORAL | Status: DC
  Administered 2016-09-25 – 2016-09-26 (×2): 40 mg via ORAL
  Filled 2016-09-23 (×2): qty 1

## 2016-09-23 MED ORDER — HYDRALAZINE HCL 20 MG/ML IJ SOLN: 10.0000 mg | INTRAMUSCULAR | Status: DC | PRN

## 2016-09-23 NOTE — ED Notes (Signed)
Patient transported to CT 

## 2016-09-23 NOTE — ED Notes (Signed)
Verbal order for Hydralazine 10mg  IVP per Dr Toniann FailKakrakandy for systolic BP >180

## 2016-09-23 NOTE — ED Notes (Signed)
Pt to have blood pressure reassessed prior to transporting pt to floor.

## 2016-09-23 NOTE — ED Triage Notes (Signed)
Per EMs- patient lives at home and tripped on her walker. Patient c/o left hip pain. No shortening or rotation noted. Patient denies LOC, but did hit her head. Towel roll placed by EMS.

## 2016-09-23 NOTE — H&P (Signed)
History and Physical    Tonya Payne ZOX:[id number] DOB: [birth year] DOA: 09/23/2016  PCP: Rocky Morel, MD  Patient coming from: Independent living facility.  Chief Complaint: Fall.  HPI: Tonya Payne is a 82 y.o. female with history of CAD status post stenting and CABG, postoperative atrial fibrillation, abdominal aortic aneurysm, COPD was brought to the ER after patient had a fall at her living facility. Patient states she was walking from the kitchen when she suddenly tripped and fell. She did hit her head but did not lose consciousness. Was complaining of pain in the left hip.   ED Course: In the ER patient's CT pelvis shows left hip fracture. Orthopedic surgeon on call Dr.Olin was consulted and patient is being admitted for further management of the hip fracture. Patient denies any chest pain palpitations shortness of breath prior or after the fall. CT of the head and C-spine were unremarkable. EKG shows normal sinus rhythm.  Review of Systems: As per HPI, rest all negative.   Past Medical History:  Diagnosis Date  . AAA (abdominal aortic aneurysm) without rupture (HCC) 2016   CTA 10/23/2015 - saccular infrarenal aneurysm roughly 4.8 cm in diameter. Stable from 2016 -- Dr. Darrick Penna  . Anxiety   . Atrial fibrillation, new onset (HCC) 10/2015   after CABG, in SR at d/c  . CAD (coronary artery disease), native coronary artery 10/08/2015   Severe coronary disease with calcified left main and LAD: 5% left main going into ostial LAD. Mid LAD 95% (treated with PTCA). Proximal RCA 50%, calcified. EF was 20-25% with apical akinesis and distal anterior hypokinesis. --> Referred for CABG  . Congestive dilated cardiomyopathy (HCC) 10/08/2015   Intra-Op TEE showed EF of 40 and 45%. She had an echo with a "normal LV function "documented, but there was no reading M.D.  . COPD (chronic obstructive pulmonary disease) (HCC)   . Depression   . History of acute anterior wall MI 10/08/2015   Presented with severe chest pain and dynamic anterior ST elevations/biphasic elevations area did not meet full criteria for STEMI, and troponin was not dramatically elevated. 95% LAD treated with PTCA followed by CABG  . Hyperlipidemia LDL goal <70 10/08/2015  . IBS (irritable bowel syndrome)   . Oral thrush   . Post PTCA 10/08/2015   Presented with STEMI, severe mid LAD lesion treated with PTCA and then referred for CABG.  . TIA (transient ischemic attack)     Past Surgical History:  Procedure Laterality Date  . BACK SURGERY    . CARDIAC CATHETERIZATION N/A 10/06/2015   Procedure: Left Heart Cath and Coronary Angiography;  Surgeon: Marykay Lex, MD;  Location: [facility] INVASIVE CV LAB;  Service: Cardiovascular;: Heavily calcified left main-LAD. 75% LM & Ost LAD, 95% d-mLAD, p-mRCA Calcified 50%. EF ~20-25%  . CARDIAC CATHETERIZATION N/A 10/06/2015   Procedure: Coronary Balloon Angioplasty;  Surgeon: Marykay Lex, MD;  Location: [facility] INVASIVE CV LAB;  Service: Cardiovascular: PTCA of 95% LAD in setting of STEMI - reduced to 70% --> would need Atherectomy-PCI vs. CABG.  Sent for CABG.  . COLECTOMY    . CORONARY ARTERY BYPASS GRAFT N/A 10/16/2015   Procedure: CORONARY ARTERY BYPASS GRAFTING (CABG)  x three, using left internal mammary artery and right leg greater saphenous vein harvested endoscopically;  Surgeon: Loreli Slot, MD;  Location: [facility] OR;  Service: Open Heart Surgery;  Laterality: N/A;  . NECK SURGERY    . TEE WITHOUT CARDIOVERSION N/A 10/16/2015   Procedure:  TRANSESOPHAGEAL ECHOCARDIOGRAM (TEE);  Surgeon: Loreli SlotSteven C Hendrickson, MD;  Location: [facility] OR;  Service: Open Heart Surgery - IntraOp:  EF 40-45%.     reports that she quit smoking about 17 months ago. Her smoking use included Cigarettes. She smoked 0.50 packs per day. She has never used smokeless tobacco. She reports that she drinks about 1.2 oz of alcohol per week . She reports that she does not use drugs.  No Known  Allergies  Family History  Problem Relation Age of Onset  . Stroke Mother        dead  . Clotting disorder Father        dead  bone marrow disease    Prior to Admission medications   Medication Sig Start Date End Date Taking? Authorizing Provider  atorvastatin (LIPITOR) 40 MG tablet Take 1 tablet (40 mg total) by mouth daily at 6 PM. Patient taking differently: Take 40 mg by mouth every morning.  12/18/15  Yes Barrett, Erin R, PA-C  fexofenadine (ALLEGRA) 180 MG tablet Take 180 mg by mouth every morning.    Yes [provider]  metoprolol tartrate (LOPRESSOR) 25 MG tablet Take 12.5 mg by mouth 2 (two) times daily. 07/03/16  Yes [provider]  Multiple Vitamins-Minerals (MULTIVITAMIN ADULTS PO) Take 1 tablet by mouth daily at 12 noon.   Yes [provider]  OLANZapine (ZYPREXA) 5 MG tablet Take 1 tablet by mouth at bedtime. 04/26/16  Yes [provider]  sertraline (ZOLOFT) 50 MG tablet Take 50 mg by mouth every morning.    Yes [provider]  traMADol (ULTRAM) 50 MG tablet Take 50 mg by mouth every 6 (six) hours as needed for moderate pain or severe pain.  08/22/16  Yes [provider]    Physical Exam: Vitals:   09/23/16 1927 09/23/16 2032 09/23/16 2136 09/23/16 2300  BP: (!) 159/97 (!) 203/103 (!) 162/114 (!) 147/71  Pulse: 96 (!) 106 (!) 103 (!) 131  Resp: 18 (!) 22  18  Temp:  97.9 F (36.6 C)  98.1 F (36.7 C)  TempSrc:  Oral  Oral  SpO2: 93% 93% 97% 94%  Weight: 51.3 kg (113 lb)     Height: 5\' 1"  (1.549 m)         Constitutional: Moderately built and nourished. Vitals:   09/23/16 1927 09/23/16 2032 09/23/16 2136 09/23/16 2300  BP: (!) 159/97 (!) 203/103 (!) 162/114 (!) 147/71  Pulse: 96 (!) 106 (!) 103 (!) 131  Resp: 18 (!) 22  18  Temp:  97.9 F (36.6 C)  98.1 F (36.7 C)  TempSrc:  Oral  Oral  SpO2: 93% 93% 97% 94%  Weight: 51.3 kg (113 lb)     Height: 5\' 1"  (1.549 m)      Eyes: Anicteric. No  pallor. ENMT: No discharge from the ears eyes nose and mouth. Neck: No mass felt. No JVD appreciated. Respiratory: No rhonchi or crepitations. Cardiovascular: S1-S2 heard no murmurs appreciated. Abdomen: Soft pulsatile mass felt. No guarding or rigidity. Musculoskeletal: Pain on moving the left hip. Skin: No rash. Skin appears warm. Neurologic: Alert awake oriented to time place and person. Moves all extremities. Psychiatric: Appears normal. Normal affect.   Labs on Admission: I have personally reviewed following labs and imaging studies  CBC:  Recent Labs Lab 09/23/16 1810  WBC 9.4  NEUTROABS 7.2  HGB 12.7  HCT 36.6  MCV 93.1  PLT 244   Basic Metabolic Panel:  Recent Labs Lab 09/23/16  1810  NA 129*  K 4.3  CL 95*  CO2 22  GLUCOSE 104*  BUN 19  CREATININE 1.47*  CALCIUM 9.4   GFR: Estimated Creatinine Clearance: 23 mL/min (A) (by C-G formula based on SCr of 1.47 mg/dL (H)). Liver Function Tests:  Recent Labs Lab 09/23/16 1810  AST 31  ALT 21  ALKPHOS 120  BILITOT 0.8  PROT 7.6  ALBUMIN 4.2   No results for input(s): LIPASE, AMYLASE in the last 168 hours. No results for input(s): AMMONIA in the last 168 hours. Coagulation Profile: No results for input(s): INR, PROTIME in the last 168 hours. Cardiac Enzymes: No results for input(s): CKTOTAL, CKMB, CKMBINDEX, TROPONINI in the last 168 hours. BNP (last 3 results) No results for input(s): PROBNP in the last 8760 hours. HbA1C: No results for input(s): HGBA1C in the last 72 hours. CBG: No results for input(s): GLUCAP in the last 168 hours. Lipid Profile: No results for input(s): CHOL, HDL, LDLCALC, TRIG, CHOLHDL, LDLDIRECT in the last 72 hours. Thyroid Function Tests: No results for input(s): TSH, T4TOTAL, FREET4, T3FREE, THYROIDAB in the last 72 hours. Anemia Panel: No results for input(s): VITAMINB12, FOLATE, FERRITIN, TIBC, IRON, RETICCTPCT in the last 72 hours. Urine analysis:    Component Value  Date/Time   COLORURINE AMBER (A) 01/27/2016 1310   APPEARANCEUR CLEAR 01/27/2016 1310   LABSPEC 1.008 01/27/2016 1310   PHURINE 6.5 01/27/2016 1310   GLUCOSEU NEGATIVE 01/27/2016 1310   HGBUR NEGATIVE 01/27/2016 1310   BILIRUBINUR NEGATIVE 01/27/2016 1310   KETONESUR NEGATIVE 01/27/2016 1310   PROTEINUR NEGATIVE 01/27/2016 1310   UROBILINOGEN 0.2 02/25/2015 1826   NITRITE POSITIVE (A) 01/27/2016 1310   LEUKOCYTESUR SMALL (A) 01/27/2016 1310   Sepsis Labs: @LABRCNTIP (procalcitonin:4,lacticidven:4) )No results found for this or any previous visit (from the past 240 hour(s)).   Radiological Exams on Admission: Dg Chest 2 View  Result Date: 09/23/2016 CLINICAL DATA:  Fall EXAM: CHEST  2 VIEW COMPARISON:  06/05/2016 FINDINGS: Lower cervical hardware. Post sternotomy changes of the mediastinum. Lungs appear hyperinflated. There is no focal consolidation or effusion. Mild fibrosis at the lung bases. No pneumothorax. Old appearing right eighth and tenth rib fractures. IMPRESSION: 1. Bibasilar fibrosis. No acute infiltrate, pleural effusion or pneumothorax. 2. Old appearing right rib fractures Electronically Signed   By: Jasmine Pang M.D.   On: 09/23/2016 18:04   Dg Pelvis 1-2 Views  Result Date: 09/23/2016 CLINICAL DATA:  Fall with pain in both hips EXAM: PELVIS - 1-2 VIEW COMPARISON:  01/26/2016 FINDINGS: SI joints symmetric. Postsurgical changes in the pelvis. Calcified pelvic phleboliths. Pubic symphysis appears intact. No dislocation. Old right superior pubic ramus fracture. Possible step-off deformity at the left subcapital femur. IMPRESSION: 1. Possible step-off deformity within the subcapital left femur, recommend CT for further evaluation. 2. Old appearing right superior pubic ramus fracture Electronically Signed   By: Jasmine Pang M.D.   On: 09/23/2016 18:06   Dg Tibia/fibula Left  Result Date: 09/23/2016 CLINICAL DATA:  Fall with laceration EXAM: LEFT TIBIA AND FIBULA - 2 VIEW  COMPARISON:  None. FINDINGS: Vascular calcifications. No acute displaced fracture or malalignment. No radiopaque foreign body. IMPRESSION: No acute osseous abnormality Electronically Signed   By: Jasmine Pang M.D.   On: 09/23/2016 18:07   Ct Head Wo Contrast  Result Date: 09/23/2016 CLINICAL DATA:  Recent trip and fall with pain, initial encounter EXAM: CT HEAD WITHOUT CONTRAST CT CERVICAL SPINE WITHOUT CONTRAST TECHNIQUE: Multidetector CT imaging of the head and cervical  spine was performed following the standard protocol without intravenous contrast. Multiplanar CT image reconstructions of the cervical spine were also generated. COMPARISON:  06/05/2016 FINDINGS: CT HEAD FINDINGS Brain: Atrophic changes and chronic white matter ischemic changes are seen. No findings to suggest acute hemorrhage, acute infarction or space-occupying mass lesion are seen. Changes of prior right occipital infarct are noted. Prior lacunar infarct is also noted in the region of the basal ganglia on the left stable from the prior exam. Vascular: No hyperdense vessel or unexpected calcification. Skull: Normal. Negative for fracture or focal lesion. Sinuses/Orbits: No acute finding. Other: None. CT CERVICAL SPINE FINDINGS Alignment: Mild anterolisthesis of C7 on T1 stable from the prior exam. Skull base and vertebrae: 7 cervical segments are well visualized. Changes of prior fusion are noted at C5-6 and C6-7. Facet hypertrophic changes are noted. No acute fracture or acute facet abnormality is noted. Soft tissues and spinal canal: No acute soft tissue abnormality is noted. Diffuse vascular calcifications are seen. Upper chest: Stable scarring in the right upper lobe is noted. Mild emphysematous changes are seen. IMPRESSION: CT of the head: Chronic changes without acute abnormality. CT of the cervical spine: Postsurgical and degenerative changes stable from the prior exam. Electronically Signed   By: Alcide Clever M.D.   On: 09/23/2016  18:59   Ct Cervical Spine Wo Contrast  Result Date: 09/23/2016 CLINICAL DATA:  Recent trip and fall with pain, initial encounter EXAM: CT HEAD WITHOUT CONTRAST CT CERVICAL SPINE WITHOUT CONTRAST TECHNIQUE: Multidetector CT imaging of the head and cervical spine was performed following the standard protocol without intravenous contrast. Multiplanar CT image reconstructions of the cervical spine were also generated. COMPARISON:  06/05/2016 FINDINGS: CT HEAD FINDINGS Brain: Atrophic changes and chronic white matter ischemic changes are seen. No findings to suggest acute hemorrhage, acute infarction or space-occupying mass lesion are seen. Changes of prior right occipital infarct are noted. Prior lacunar infarct is also noted in the region of the basal ganglia on the left stable from the prior exam. Vascular: No hyperdense vessel or unexpected calcification. Skull: Normal. Negative for fracture or focal lesion. Sinuses/Orbits: No acute finding. Other: None. CT CERVICAL SPINE FINDINGS Alignment: Mild anterolisthesis of C7 on T1 stable from the prior exam. Skull base and vertebrae: 7 cervical segments are well visualized. Changes of prior fusion are noted at C5-6 and C6-7. Facet hypertrophic changes are noted. No acute fracture or acute facet abnormality is noted. Soft tissues and spinal canal: No acute soft tissue abnormality is noted. Diffuse vascular calcifications are seen. Upper chest: Stable scarring in the right upper lobe is noted. Mild emphysematous changes are seen. IMPRESSION: CT of the head: Chronic changes without acute abnormality. CT of the cervical spine: Postsurgical and degenerative changes stable from the prior exam. Electronically Signed   By: Alcide Clever M.D.   On: 09/23/2016 18:59   Ct Pelvis Wo Contrast  Result Date: 09/23/2016 CLINICAL DATA:  Evaluate for hip fracture. EXAM: CT PELVIS WITHOUT CONTRAST TECHNIQUE: Multidetector CT imaging of the pelvis was performed following the standard  protocol without intravenous contrast. COMPARISON:  01/26/2016. FINDINGS: Urinary Tract:  No abnormality visualized. Bowel:  Unremarkable visualized pelvic bowel loops. Vascular/Lymphatic: Aortic atherosclerosis. Infrarenal abdominal aortic aneurysm measures 4.9 cm, image number 1 of series 4. This is increased from 4.7 cm previously. No adenopathy identified. Reproductive:  No mass or other significant abnormality Other:  None. Musculoskeletal: The bones appear diffusely osteopenic. There is an acute slightly impacted subcapital left femoral neck fracture,  image 51 of series 6. No dislocation. IMPRESSION: 1. Acute slightly impacted subcapital femoral neck fracture involves the left hip. 2. Aortic aneurysm NOS (ICD10-I71.9). This measures 4.9 cm. Recommend followup by abdomen and pelvis CTA in 6 months, and vascular surgery referral/consultation if not already obtained. This recommendation follows ACR consensus guidelines: White Paper of the ACR Incidental Findings Committee II on Vascular Findings. J Am Coll Radiol 2013; 69:629-528. 3.  Aortic Atherosclerosis (ICD10-I70.0). 4. These results will be called to the ordering clinician or representative by the Radiologist Assistant, and communication documented in the PACS or zVision Dashboard. Electronically Signed   By: Signa Kell M.D.   On: 09/23/2016 19:29   Dg Knee Complete 4 Views Right  Result Date: 09/23/2016 CLINICAL DATA:  Fall with pain to the anterior knee EXAM: RIGHT KNEE - COMPLETE 4+ VIEW COMPARISON:  09/21/2015 FINDINGS: Status post wire fixation of patella with fracture through the left upper limb as before. No acute displaced fracture or malalignment. Mild medial and lateral degenerative changes. No large effusion. Vascular calcifications. IMPRESSION: Postsurgical changes of the patella. No definite acute osseous abnormality. Electronically Signed   By: Jasmine Pang M.D.   On: 09/23/2016 18:10    EKG: Independently reviewed. Normal sinus  rhythm.  Assessment/Plan Active Problems:   S/P CABG x 3   AAA (abdominal aortic aneurysm) without rupture (HCC)   Hip fracture (HCC)   Hyponatremia   Hypertension   Left displaced femoral neck fracture (HCC)    1. Left hip fracture status post mechanical fall - patient is at moderate to high risk for the intermediate risk procedure. Will be kept nothing by mouth except medications after midnight. Orthopedic surgeon has been consulted. Pain relief medications. 2. Hypertension uncontrolled - probably secondary to pain. I have placed patient on when necessary IV hydralazine for systolic blood pressure more than 160 in addition to patient's home medications of metoprolol. I think patient's blood pressure will improve the control of pain. 3. Sinus tachycardia probably secondary to pain - close to monitor in telemetry. On metoprolol. 4. Hyponatremia - the patient did receive fluids in the ER. Recheck metabolic panel. 5. Abdominal aortic aneurysm - denies any abdominal pain. 6. History of CAD status post CABG - denies any chest pain. On metoprolol statins and aspirin. Aspirin will be continued after surgery per the recommendation of orthopedics.   DVT prophylaxis: SCDs. Code Status: DO NOT RESUSCITATE.  Family Communication: Patient's son.  Disposition Plan: Rehabilitation.  Consults called: Orthopedics.  Admission status: Inpatient.    Eduard Clos MD [facility] Pager [phone].  If 7PM-7AM, please contact night-coverage [email or web address] Password [email or web address]  09/23/2016, 11:20 PM

## 2016-09-23 NOTE — ED Notes (Signed)
Bed: [id number] Expected date:  Expected time:  Means of arrival:  Comments: EMS 82 yo fall

## 2016-09-23 NOTE — ED Provider Notes (Signed)
[facility]-EMERGENCY DEPT Provider Note   CSN: [id number] Arrival date & time: 09/23/16  1656     History   Chief Complaint Chief Complaint  Patient presents with  . Fall  . Hip Pain    HPI Tonya Payne is a 82 y.o. female hx of AAA, afib not on blood thinner, CABG, TIA, previous pelvic fractures here with fall. Patient states that she was walking with her walker at home and tripped over her walker and then landed backwards on her head and neck. She is complaining of diffuse back pain and bilateral hip and pelvic pain. EMS was called and she was brought in by EMS for evaluation. She was admitted for pelvic fracture after fall several months ago.   The history is provided by the patient.    Past Medical History:  Diagnosis Date  . AAA (abdominal aortic aneurysm) without rupture (HCC) 2016   CTA 10/23/2015 - saccular infrarenal aneurysm roughly 4.8 cm in diameter. Stable from 2016 -- Dr. Darrick Penna  . Anxiety   . Atrial fibrillation, new onset (HCC) 10/2015   after CABG, in SR at d/c  . CAD (coronary artery disease), native coronary artery 10/08/2015   Severe coronary disease with calcified left main and LAD: 5% left main going into ostial LAD. Mid LAD 95% (treated with PTCA). Proximal RCA 50%, calcified. EF was 20-25% with apical akinesis and distal anterior hypokinesis. --> Referred for CABG  . Congestive dilated cardiomyopathy (HCC) 10/08/2015   Intra-Op TEE showed EF of 40 and 45%. She had an echo with a "normal LV function "documented, but there was no reading M.D.  . COPD (chronic obstructive pulmonary disease) (HCC)   . Depression   . History of acute anterior wall MI 10/08/2015   Presented with severe chest pain and dynamic anterior ST elevations/biphasic elevations area did not meet full criteria for STEMI, and troponin was not dramatically elevated. 95% LAD treated with PTCA followed by CABG  . Hyperlipidemia LDL goal <70 10/08/2015  . IBS (irritable bowel syndrome)   . Oral thrush    . Post PTCA 10/08/2015   Presented with STEMI, severe mid LAD lesion treated with PTCA and then referred for CABG.  . TIA (transient ischemic attack)     Patient Active Problem List   Diagnosis Date Noted  . Closed fracture of multiple pubic rami, right, sequela 02/27/2016  . Displaced fracture of neck of right second metacarpal bone with routine healing 02/27/2016  . Protein-calorie malnutrition, severe (HCC) 02/22/2016  . Orthostatic dizziness 02/22/2016  . Postoperative atrial fibrillation (HCC) 02/22/2016  . Fall   . Hand fracture   . Pelvic fracture (HCC) 01/26/2016  . AAA (abdominal aortic aneurysm) without rupture (HCC) 11/30/2015  . Post PTCA   . S/P CABG x 3 10/16/2015  . Coronary artery disease involving native coronary artery with angina pectoris (HCC) 10/08/2015  . Congestive dilated cardiomyopathy (HCC) 10/08/2015  . Hyperlipidemia LDL goal <70 10/08/2015  . UTI (urinary tract infection) 10/06/2015  . Myocardial infarction involving left anterior descending (LAD) coronary artery (HCC) 10/06/2015    Past Surgical History:  Procedure Laterality Date  . BACK SURGERY    . CARDIAC CATHETERIZATION N/A 10/06/2015   Procedure: Left Heart Cath and Coronary Angiography;  Surgeon: Marykay Lex, MD;  Location: [facility] INVASIVE CV LAB;  Service: Cardiovascular;: Heavily calcified left main-LAD. 75% LM & Ost LAD, 95% d-mLAD, p-mRCA Calcified 50%. EF ~20-25%  . CARDIAC CATHETERIZATION N/A 10/06/2015   Procedure: Coronary Balloon Angioplasty;  Surgeon: Marykay Lex, MD;  Location: [facility] INVASIVE CV LAB;  Service: Cardiovascular: PTCA of 95% LAD in setting of STEMI - reduced to 70% --> would need Atherectomy-PCI vs. CABG.  Sent for CABG.  . COLECTOMY    . CORONARY ARTERY BYPASS GRAFT N/A 10/16/2015   Procedure: CORONARY ARTERY BYPASS GRAFTING (CABG)  x three, using left internal mammary artery and right leg greater saphenous vein harvested endoscopically;  Surgeon: Loreli Slot, MD;   Location: [facility] OR;  Service: Open Heart Surgery;  Laterality: N/A;  . NECK SURGERY    . TEE WITHOUT CARDIOVERSION N/A 10/16/2015   Procedure: TRANSESOPHAGEAL ECHOCARDIOGRAM (TEE);  Surgeon: Loreli Slot, MD;  Location: [facility] OR;  Service: Open Heart Surgery - IntraOp:  EF 40-45%.    OB History    No data available       Home Medications    Prior to Admission medications   Medication Sig Start Date End Date Taking? Authorizing Provider  amLODipine (NORVASC) 2.5 MG tablet Take 1 tablet by mouth daily. 06/04/16   [provider]  atorvastatin (LIPITOR) 40 MG tablet Take 1 tablet (40 mg total) by mouth daily at 6 PM. 12/18/15   Barrett, Erin R, PA-C  fexofenadine (ALLEGRA) 180 MG tablet Take 180 mg by mouth daily as needed for allergies or rhinitis.    [provider]  meclizine (ANTIVERT) 25 MG tablet Take 25 mg by mouth. 1 tablet BID PRN and 1 tablet qd    [provider]  OLANZapine (ZYPREXA) 5 MG tablet Take 1 tablet by mouth at bedtime. 04/26/16   [provider]  sertraline (ZOLOFT) 50 MG tablet Take 50 mg by mouth every morning.     [provider]    Family History Family History  Problem Relation Age of Onset  . Stroke Mother        dead  . Clotting disorder Father        dead  bone marrow disease    Social History Social History  Substance Use Topics  . Smoking status: Former Smoker    Packs/day: 0.50    Types: Cigarettes    Quit date: 04/11/2015  . Smokeless tobacco: Never Used  . Alcohol use 1.2 oz/week    2 Glasses of wine per week     Allergies   Patient has no known allergies.   Review of Systems Review of Systems  Musculoskeletal:       Hip pain, pelvic pain,   All other systems reviewed and are negative.    Physical Exam Updated Vital Signs BP (!) 159/97 (BP Location: Left Arm)   Pulse 96   Temp 98 F (36.7 C) (Oral)   Resp 18   Ht 5\' 1"  (1.549 m)   Wt 51.3 kg (113 lb)   SpO2 93%   BMI 21.35  kg/m   Physical Exam  Constitutional:  Uncomfortable   HENT:  Head: Normocephalic.  Right Ear: External ear normal.  Left Ear: External ear normal.  No obvious scalp hematoma   Eyes: EOM are normal. Pupils are equal, round, and reactive to light.  Neck: Normal range of motion.  No obvious midline tenderness   Cardiovascular: Normal rate, regular rhythm and normal heart sounds.   Pulmonary/Chest: Effort normal and breath sounds normal. No respiratory distress. She has no wheezes. She has no rales.  Abdominal: Soft. Bowel sounds are normal. She exhibits no distension. There is no tenderness.  Musculoskeletal:  Mild diffuse spinal tenderness,  no obvious deformity. Mild l pelvic tenderness, able to range bilateral hips. More tenderness on L hip. Abrasion R knee, L tib/fib   Neurological: She is alert.  Skin: Skin is warm.  Psychiatric: She has a normal mood and affect.  Nursing note and vitals reviewed.    ED Treatments / Results  Labs (all labs ordered are listed, but only abnormal results are displayed) Labs Reviewed  CBC WITH DIFFERENTIAL/PLATELET - Abnormal; Notable for the following:       Result Value   Monocytes Absolute 1.1 (*)    All other components within normal limits  COMPREHENSIVE METABOLIC PANEL - Abnormal; Notable for the following:    Sodium 129 (*)    Chloride 95 (*)    Glucose, Bld 104 (*)    Creatinine, Ser 1.47 (*)    GFR calc non Af Amer 32 (*)    GFR calc Af Amer 38 (*)    All other components within normal limits  URINALYSIS, ROUTINE W REFLEX MICROSCOPIC  I-STAT TROPOININ, ED    EKG  EKG Interpretation  Date/Time:  Monday Sep 23 2016 17:08:49 EDT Ventricular Rate:  90 PR Interval:    QRS Duration: 72 QT Interval:  382 QTC Calculation: 468 R Axis:   59 Text Interpretation:  Sinus rhythm Probable left atrial enlargement Baseline wander in lead(s) V6 No significant change since last tracing Confirmed by YAO  MD, DAVID ([id number]) on 09/23/2016  5:13:25 PM       Radiology Dg Chest 2 View  Result Date: 09/23/2016 CLINICAL DATA:  Fall EXAM: CHEST  2 VIEW COMPARISON:  06/05/2016 FINDINGS: Lower cervical hardware. Post sternotomy changes of the mediastinum. Lungs appear hyperinflated. There is no focal consolidation or effusion. Mild fibrosis at the lung bases. No pneumothorax. Old appearing right eighth and tenth rib fractures. IMPRESSION: 1. Bibasilar fibrosis. No acute infiltrate, pleural effusion or pneumothorax. 2. Old appearing right rib fractures Electronically Signed   By: Jasmine Pang M.D.   On: 09/23/2016 18:04   Dg Pelvis 1-2 Views  Result Date: 09/23/2016 CLINICAL DATA:  Fall with pain in both hips EXAM: PELVIS - 1-2 VIEW COMPARISON:  01/26/2016 FINDINGS: SI joints symmetric. Postsurgical changes in the pelvis. Calcified pelvic phleboliths. Pubic symphysis appears intact. No dislocation. Old right superior pubic ramus fracture. Possible step-off deformity at the left subcapital femur. IMPRESSION: 1. Possible step-off deformity within the subcapital left femur, recommend CT for further evaluation. 2. Old appearing right superior pubic ramus fracture Electronically Signed   By: Jasmine Pang M.D.   On: 09/23/2016 18:06   Dg Tibia/fibula Left  Result Date: 09/23/2016 CLINICAL DATA:  Fall with laceration EXAM: LEFT TIBIA AND FIBULA - 2 VIEW COMPARISON:  None. FINDINGS: Vascular calcifications. No acute displaced fracture or malalignment. No radiopaque foreign body. IMPRESSION: No acute osseous abnormality Electronically Signed   By: Jasmine Pang M.D.   On: 09/23/2016 18:07   Ct Head Wo Contrast  Result Date: 09/23/2016 CLINICAL DATA:  Recent trip and fall with pain, initial encounter EXAM: CT HEAD WITHOUT CONTRAST CT CERVICAL SPINE WITHOUT CONTRAST TECHNIQUE: Multidetector CT imaging of the head and cervical spine was performed following the standard protocol without intravenous contrast. Multiplanar CT image reconstructions of the  cervical spine were also generated. COMPARISON:  06/05/2016 FINDINGS: CT HEAD FINDINGS Brain: Atrophic changes and chronic white matter ischemic changes are seen. No findings to suggest acute hemorrhage, acute infarction or space-occupying mass lesion are seen. Changes of prior right occipital infarct are noted. Prior  lacunar infarct is also noted in the region of the basal ganglia on the left stable from the prior exam. Vascular: No hyperdense vessel or unexpected calcification. Skull: Normal. Negative for fracture or focal lesion. Sinuses/Orbits: No acute finding. Other: None. CT CERVICAL SPINE FINDINGS Alignment: Mild anterolisthesis of C7 on T1 stable from the prior exam. Skull base and vertebrae: 7 cervical segments are well visualized. Changes of prior fusion are noted at C5-6 and C6-7. Facet hypertrophic changes are noted. No acute fracture or acute facet abnormality is noted. Soft tissues and spinal canal: No acute soft tissue abnormality is noted. Diffuse vascular calcifications are seen. Upper chest: Stable scarring in the right upper lobe is noted. Mild emphysematous changes are seen. IMPRESSION: CT of the head: Chronic changes without acute abnormality. CT of the cervical spine: Postsurgical and degenerative changes stable from the prior exam. Electronically Signed   By: Alcide Clever M.D.   On: 09/23/2016 18:59   Ct Cervical Spine Wo Contrast  Result Date: 09/23/2016 CLINICAL DATA:  Recent trip and fall with pain, initial encounter EXAM: CT HEAD WITHOUT CONTRAST CT CERVICAL SPINE WITHOUT CONTRAST TECHNIQUE: Multidetector CT imaging of the head and cervical spine was performed following the standard protocol without intravenous contrast. Multiplanar CT image reconstructions of the cervical spine were also generated. COMPARISON:  06/05/2016 FINDINGS: CT HEAD FINDINGS Brain: Atrophic changes and chronic white matter ischemic changes are seen. No findings to suggest acute hemorrhage, acute infarction or  space-occupying mass lesion are seen. Changes of prior right occipital infarct are noted. Prior lacunar infarct is also noted in the region of the basal ganglia on the left stable from the prior exam. Vascular: No hyperdense vessel or unexpected calcification. Skull: Normal. Negative for fracture or focal lesion. Sinuses/Orbits: No acute finding. Other: None. CT CERVICAL SPINE FINDINGS Alignment: Mild anterolisthesis of C7 on T1 stable from the prior exam. Skull base and vertebrae: 7 cervical segments are well visualized. Changes of prior fusion are noted at C5-6 and C6-7. Facet hypertrophic changes are noted. No acute fracture or acute facet abnormality is noted. Soft tissues and spinal canal: No acute soft tissue abnormality is noted. Diffuse vascular calcifications are seen. Upper chest: Stable scarring in the right upper lobe is noted. Mild emphysematous changes are seen. IMPRESSION: CT of the head: Chronic changes without acute abnormality. CT of the cervical spine: Postsurgical and degenerative changes stable from the prior exam. Electronically Signed   By: Alcide Clever M.D.   On: 09/23/2016 18:59   Ct Pelvis Wo Contrast  Result Date: 09/23/2016 CLINICAL DATA:  Evaluate for hip fracture. EXAM: CT PELVIS WITHOUT CONTRAST TECHNIQUE: Multidetector CT imaging of the pelvis was performed following the standard protocol without intravenous contrast. COMPARISON:  01/26/2016. FINDINGS: Urinary Tract:  No abnormality visualized. Bowel:  Unremarkable visualized pelvic bowel loops. Vascular/Lymphatic: Aortic atherosclerosis. Infrarenal abdominal aortic aneurysm measures 4.9 cm, image number 1 of series 4. This is increased from 4.7 cm previously. No adenopathy identified. Reproductive:  No mass or other significant abnormality Other:  None. Musculoskeletal: The bones appear diffusely osteopenic. There is an acute slightly impacted subcapital left femoral neck fracture, image 51 of series 6. No dislocation.  IMPRESSION: 1. Acute slightly impacted subcapital femoral neck fracture involves the left hip. 2. Aortic aneurysm NOS (ICD10-I71.9). This measures 4.9 cm. Recommend followup by abdomen and pelvis CTA in 6 months, and vascular surgery referral/consultation if not already obtained. This recommendation follows ACR consensus guidelines: White Paper of the ACR Incidental Findings Committee II  on Vascular Findings. J Am Coll Radiol 2013; 40:981-191; 10:789-794. 3.  Aortic Atherosclerosis (ICD10-I70.0). 4. These results will be called to the ordering clinician or representative by the Radiologist Assistant, and communication documented in the PACS or zVision Dashboard. Electronically Signed   By: Signa Kellaylor  Stroud M.D.   On: 09/23/2016 19:29   Dg Knee Complete 4 Views Right  Result Date: 09/23/2016 CLINICAL DATA:  Fall with pain to the anterior knee EXAM: RIGHT KNEE - COMPLETE 4+ VIEW COMPARISON:  09/21/2015 FINDINGS: Status post wire fixation of patella with fracture through the left upper limb as before. No acute displaced fracture or malalignment. Mild medial and lateral degenerative changes. No large effusion. Vascular calcifications. IMPRESSION: Postsurgical changes of the patella. No definite acute osseous abnormality. Electronically Signed   By: Jasmine PangKim  Fujinaga M.D.   On: 09/23/2016 18:10    Procedures Procedures (including critical care time)  Medications Ordered in ED Medications  morphine 4 MG/ML injection 4 mg (4 mg Intravenous Given 09/23/16 1819)  sodium chloride 0.9 % bolus 1,000 mL (1,000 mLs Intravenous New Bag/Given 09/23/16 1924)     Initial Impression / Assessment and Plan / ED Course  I have reviewed the triage vital signs and the nursing notes.  Pertinent labs & imaging results that were available during my care of the patient were reviewed by me and considered in my medical decision making (see chart for details).     Julio AlmSuann Payne is a 82 y.o. female here with fall. Mechanical fall at home. Has  head injury, L hip pain. Will get labs, CT head/neck.   8:15 PM CT head/neck unremarkable. Na 129, baseline around 135. She hasn't been drinking much so likely mild dehydration. Xray showed possible hip fracture, confirmed on CT pelvis. I called Dr. Charlann Boxerlin, who recommend NPO after midnight for surgery tomorrow morning. Dr. Toniann FailKakrakandy to admit tonight.    Final Clinical Impressions(s) / ED Diagnoses   Final diagnoses:  None    New Prescriptions New Prescriptions   No medications on file     Charlynne PanderYao, David Hsienta, MD 09/23/16 2017

## 2016-09-24 ENCOUNTER — Other Ambulatory Visit: Payer: Self-pay

## 2016-09-24 ENCOUNTER — Inpatient Hospital Stay (HOSPITAL_COMMUNITY): Payer: Medicare Other

## 2016-09-24 ENCOUNTER — Inpatient Hospital Stay (HOSPITAL_COMMUNITY): Payer: Medicare Other | Admitting: Certified Registered Nurse Anesthetist

## 2016-09-24 ENCOUNTER — Encounter (HOSPITAL_COMMUNITY)
Admission: EM | Disposition: A | Discharge status: Skilled Nursing Facility | Payer: Self-pay | Source: Home / Self Care | Attending: Internal Medicine

## 2016-09-24 ENCOUNTER — Encounter (HOSPITAL_COMMUNITY): Payer: Self-pay | Admitting: Anesthesiology

## 2016-09-24 DIAGNOSIS — N183 Chronic kidney disease, stage 3 unspecified: Secondary | ICD-10-CM

## 2016-09-24 HISTORY — PX: HIP PINNING,CANNULATED: SHX1758

## 2016-09-24 LAB — ABO/RH: ABO/RH(D): A POS

## 2016-09-24 LAB — URINALYSIS, ROUTINE W REFLEX MICROSCOPIC
Bilirubin Urine: NEGATIVE
Glucose, UA: 50 mg/dL — AB
Hgb urine dipstick: NEGATIVE
KETONES UR: 5 mg/dL — AB
LEUKOCYTES UA: NEGATIVE
NITRITE: NEGATIVE
PROTEIN: NEGATIVE mg/dL
Specific Gravity, Urine: 1.006 (ref 1.005–1.030)
pH: 7 (ref 5.0–8.0)

## 2016-09-24 LAB — COMPREHENSIVE METABOLIC PANEL
ALT: 19 U/L (ref 14–54)
AST: 31 U/L (ref 15–41)
Albumin: 3.9 g/dL (ref 3.5–5.0)
Alkaline Phosphatase: 109 U/L (ref 38–126)
Anion gap: 10 (ref 5–15)
BILIRUBIN TOTAL: 0.8 mg/dL (ref 0.3–1.2)
BUN: 16 mg/dL (ref 6–20)
CO2: 23 mmol/L (ref 22–32)
CREATININE: 1.25 mg/dL — AB (ref 0.44–1.00)
Calcium: 9 mg/dL (ref 8.9–10.3)
Chloride: 100 mmol/L — ABNORMAL LOW (ref 101–111)
GFR calc Af Amer: 46 mL/min — ABNORMAL LOW (ref >60)
GFR calc non Af Amer: 40 mL/min — ABNORMAL LOW (ref >60)
Glucose, Bld: 129 mg/dL — ABNORMAL HIGH (ref 65–99)
Potassium: 4 mmol/L (ref 3.5–5.1)
Sodium: 133 mmol/L — ABNORMAL LOW (ref 135–145)
TOTAL PROTEIN: 6.9 g/dL (ref 6.5–8.1)

## 2016-09-24 LAB — CBC
HEMATOCRIT: 37.9 % (ref 36.0–46.0)
Hemoglobin: 12.9 g/dL (ref 12.0–15.0)
MCH: 32.1 pg (ref 26.0–34.0)
MCHC: 34 g/dL (ref 30.0–36.0)
MCV: 94.3 fL (ref 78.0–100.0)
Platelets: 259 10*3/uL (ref 150–400)
RBC: 4.02 MIL/uL (ref 3.87–5.11)
RDW: 13.8 % (ref 11.5–15.5)
WBC: 13 10*3/uL — AB (ref 4.0–10.5)

## 2016-09-24 LAB — SURGICAL PCR SCREEN
MRSA, PCR: NEGATIVE
STAPHYLOCOCCUS AUREUS: NEGATIVE

## 2016-09-24 SURGERY — FIXATION, FEMUR, NECK, PERCUTANEOUS, USING SCREW
Anesthesia: Spinal | Laterality: Left

## 2016-09-24 MED ORDER — METHOCARBAMOL 1000 MG/10ML IJ SOLN
500.0000 mg | Freq: Four times a day (QID) | INTRAVENOUS | Status: DC | PRN
  Filled 2016-09-24: qty 5

## 2016-09-24 MED ORDER — SODIUM CHLORIDE 0.9 % IV SOLN: INTRAVENOUS | Status: DC

## 2016-09-24 MED ORDER — CEFAZOLIN SODIUM-DEXTROSE 2-4 GM/100ML-% IV SOLN
2.0000 g | INTRAVENOUS | Status: AC
  Administered 2016-09-24: 2 g via INTRAVENOUS
  Filled 2016-09-24: qty 100

## 2016-09-24 MED ORDER — METHOCARBAMOL 500 MG PO TABS: 500.0000 mg | ORAL_TABLET | Freq: Four times a day (QID) | ORAL | Status: DC | PRN

## 2016-09-24 MED ORDER — METHOCARBAMOL 500 MG PO TABS: 500.0000 mg | ORAL_TABLET | Freq: Four times a day (QID) | ORAL | 0 refills | Status: DC | PRN

## 2016-09-24 MED ORDER — PROPOFOL 10 MG/ML IV BOLUS
INTRAVENOUS | Status: DC | PRN
  Administered 2016-09-24: 50 mg via INTRAVENOUS

## 2016-09-24 MED ORDER — FERROUS SULFATE 325 (65 FE) MG PO TABS: 325.0000 mg | ORAL_TABLET | Freq: Three times a day (TID) | ORAL | Status: DC

## 2016-09-24 MED ORDER — MENTHOL 3 MG MT LOZG
1.0000 | LOZENGE | OROMUCOSAL | Status: DC | PRN
  Filled 2016-09-24: qty 9

## 2016-09-24 MED ORDER — PROMETHAZINE HCL 25 MG/ML IJ SOLN: 6.2500 mg | INTRAMUSCULAR | Status: DC | PRN

## 2016-09-24 MED ORDER — METOCLOPRAMIDE HCL 5 MG/ML IJ SOLN
5.0000 mg | Freq: Three times a day (TID) | INTRAMUSCULAR | Status: DC | PRN
  Administered 2016-09-24 – 2016-09-26 (×2): 10 mg via INTRAVENOUS
  Filled 2016-09-24 (×2): qty 2

## 2016-09-24 MED ORDER — ONDANSETRON HCL 4 MG/2ML IJ SOLN
INTRAMUSCULAR | Status: DC | PRN
  Administered 2016-09-24: 4 mg via INTRAVENOUS

## 2016-09-24 MED ORDER — ASPIRIN EC 325 MG PO TBEC
325.0000 mg | DELAYED_RELEASE_TABLET | Freq: Two times a day (BID) | ORAL | Status: DC
  Administered 2016-09-25 – 2016-09-26 (×3): 325 mg via ORAL
  Filled 2016-09-24 (×3): qty 1

## 2016-09-24 MED ORDER — BUPIVACAINE IN DEXTROSE 0.75-8.25 % IT SOLN
INTRATHECAL | Status: DC | PRN
  Administered 2016-09-24: 1.5 mL via INTRATHECAL

## 2016-09-24 MED ORDER — FERROUS SULFATE 325 (65 FE) MG PO TABS
325.0000 mg | ORAL_TABLET | Freq: Three times a day (TID) | ORAL | Status: DC
  Administered 2016-09-25 – 2016-09-26 (×6): 325 mg via ORAL
  Filled 2016-09-24 (×6): qty 1

## 2016-09-24 MED ORDER — DOCUSATE SODIUM 100 MG PO CAPS: 100.0000 mg | ORAL_CAPSULE | Freq: Two times a day (BID) | ORAL | 0 refills | Status: AC

## 2016-09-24 MED ORDER — PROPOFOL 10 MG/ML IV BOLUS
INTRAVENOUS | Status: AC
  Filled 2016-09-24: qty 20

## 2016-09-24 MED ORDER — PHENOL 1.4 % MT LIQD
1.0000 | OROMUCOSAL | Status: DC | PRN
  Filled 2016-09-24: qty 177

## 2016-09-24 MED ORDER — LACTATED RINGERS IV SOLN
INTRAVENOUS | Status: DC
  Administered 2016-09-24 (×2): via INTRAVENOUS

## 2016-09-24 MED ORDER — 0.9 % SODIUM CHLORIDE (POUR BTL) OPTIME
TOPICAL | Status: DC | PRN
  Administered 2016-09-24: 1000 mL

## 2016-09-24 MED ORDER — POLYETHYLENE GLYCOL 3350 17 G PO PACK: 17.0000 g | PACK | Freq: Two times a day (BID) | ORAL | 0 refills | Status: DC

## 2016-09-24 MED ORDER — HYDROCODONE-ACETAMINOPHEN 7.5-325 MG PO TABS: 1.0000 | ORAL_TABLET | ORAL | 0 refills | Status: DC | PRN

## 2016-09-24 MED ORDER — PHENYLEPHRINE HCL 10 MG/ML IJ SOLN
INTRAVENOUS | Status: DC | PRN
  Administered 2016-09-24: 25 ug/min via INTRAVENOUS

## 2016-09-24 MED ORDER — SODIUM CHLORIDE 0.9 % IV SOLN
INTRAVENOUS | Status: DC
  Administered 2016-09-24 – 2016-09-26 (×3): via INTRAVENOUS

## 2016-09-24 MED ORDER — POVIDONE-IODINE 10 % EX SWAB: 2.0000 "application " | Freq: Once | CUTANEOUS | Status: DC

## 2016-09-24 MED ORDER — CHLORHEXIDINE GLUCONATE 4 % EX LIQD
60.0000 mL | Freq: Once | CUTANEOUS | Status: AC
  Administered 2016-09-24: 4 via TOPICAL
  Filled 2016-09-24: qty 60

## 2016-09-24 MED ORDER — PHENYLEPHRINE 40 MCG/ML (10ML) SYRINGE FOR IV PUSH (FOR BLOOD PRESSURE SUPPORT)
PREFILLED_SYRINGE | INTRAVENOUS | Status: DC | PRN
  Administered 2016-09-24: 120 ug via INTRAVENOUS

## 2016-09-24 MED ORDER — CEFAZOLIN SODIUM-DEXTROSE 1-4 GM/50ML-% IV SOLN
1.0000 g | Freq: Four times a day (QID) | INTRAVENOUS | Status: AC
  Administered 2016-09-25 (×2): 1 g via INTRAVENOUS
  Filled 2016-09-24 (×3): qty 50

## 2016-09-24 MED ORDER — METOCLOPRAMIDE HCL 5 MG PO TABS: 5.0000 mg | ORAL_TABLET | Freq: Three times a day (TID) | ORAL | Status: DC | PRN

## 2016-09-24 MED ORDER — ASPIRIN 81 MG PO CHEW: 81.0000 mg | CHEWABLE_TABLET | Freq: Two times a day (BID) | ORAL | 0 refills | Status: DC

## 2016-09-24 MED ORDER — HYDROCODONE-ACETAMINOPHEN 5-325 MG PO TABS
1.0000 | ORAL_TABLET | Freq: Four times a day (QID) | ORAL | Status: DC | PRN
  Administered 2016-09-25 – 2016-09-26 (×3): 2 via ORAL
  Filled 2016-09-24 (×3): qty 2

## 2016-09-24 MED ORDER — FENTANYL CITRATE (PF) 100 MCG/2ML IJ SOLN: 25.0000 ug | INTRAMUSCULAR | Status: DC | PRN

## 2016-09-24 MED ORDER — PROPOFOL 500 MG/50ML IV EMUL
INTRAVENOUS | Status: DC | PRN
  Administered 2016-09-24: 50 ug/kg/min via INTRAVENOUS

## 2016-09-24 MED ORDER — LACTATED RINGERS IV SOLN: INTRAVENOUS | Status: DC

## 2016-09-24 SURGICAL SUPPLY — 34 items
BAG ZIPLOCK 12X15 (MISCELLANEOUS) IMPLANT
BLADE SAW SGTL 11.0X1.19X90.0M (BLADE) IMPLANT
BNDG GAUZE ELAST 4 BULKY (GAUZE/BANDAGES/DRESSINGS) ×3 IMPLANT
COVER PERINEAL POST (MISCELLANEOUS) ×3 IMPLANT
COVER SURGICAL LIGHT HANDLE (MISCELLANEOUS) ×3 IMPLANT
DERMABOND ADVANCED (GAUZE/BANDAGES/DRESSINGS) ×2
DERMABOND ADVANCED .7 DNX12 (GAUZE/BANDAGES/DRESSINGS) ×1 IMPLANT
DRAPE STERI IOBAN 125X83 (DRAPES) ×3 IMPLANT
DRAPE U-SHAPE 47X51 STRL (DRAPES) ×6 IMPLANT
DRSG AQUACEL AG ADV 3.5X 6 (GAUZE/BANDAGES/DRESSINGS) ×3 IMPLANT
DURAPREP 26ML APPLICATOR (WOUND CARE) ×3 IMPLANT
ELECT REM PT RETURN 15FT ADLT (MISCELLANEOUS) ×3 IMPLANT
GLOVE BIOGEL M 7.0 STRL (GLOVE) IMPLANT
GLOVE BIOGEL PI IND STRL 7.5 (GLOVE) ×1 IMPLANT
GLOVE BIOGEL PI IND STRL 8.5 (GLOVE) IMPLANT
GLOVE BIOGEL PI INDICATOR 7.5 (GLOVE) ×2
GLOVE BIOGEL PI INDICATOR 8.5 (GLOVE)
GLOVE ECLIPSE 8.0 STRL XLNG CF (GLOVE) IMPLANT
GLOVE ORTHO TXT STRL SZ7.5 (GLOVE) ×3 IMPLANT
GOWN STRL REUS W/TWL LRG LVL3 (GOWN DISPOSABLE) ×3 IMPLANT
GOWN STRL REUS W/TWL XL LVL3 (GOWN DISPOSABLE) IMPLANT
KIT BASIN OR (CUSTOM PROCEDURE TRAY) ×3 IMPLANT
MANIFOLD NEPTUNE II (INSTRUMENTS) ×3 IMPLANT
NS IRRIG 1000ML POUR BTL (IV SOLUTION) ×3 IMPLANT
PACK GENERAL/GYN (CUSTOM PROCEDURE TRAY) ×3 IMPLANT
PIN GUIDE DRILL TIP 2.8X300 (DRILL) ×9 IMPLANT
POSITIONER SURGICAL ARM (MISCELLANEOUS) ×3 IMPLANT
SCREW 8.0X80MMX16 (Screw) ×6 IMPLANT
SCREW PARTIAL THREAD 8.0X90MM (Screw) ×3 IMPLANT
SUT MNCRL AB 4-0 PS2 18 (SUTURE) ×3 IMPLANT
SUT VIC AB 1 CT1 36 (SUTURE) ×3 IMPLANT
SUT VIC AB 2-0 CT1 27 (SUTURE) ×2
SUT VIC AB 2-0 CT1 TAPERPNT 27 (SUTURE) ×1 IMPLANT
TOWEL OR 17X26 10 PK STRL BLUE (TOWEL DISPOSABLE) ×3 IMPLANT

## 2016-09-24 NOTE — Progress Notes (Signed)
Paged Dr. Toniann FailKakrakandy at [phone] with EKG results, sinus tachycardia.

## 2016-09-24 NOTE — Progress Notes (Signed)
Initial Nutrition Assessment  INTERVENTION:   Diet advancement per MD Once diet advanced, provide Ensure Enlive po BID, each supplement provides 350 kcal and 20 grams of protein  RD to continue to monitor  NUTRITION DIAGNOSIS:   Increased nutrient needs related to other (see comment) (hip fracture) as evidenced by estimated needs.  GOAL:   Patient will meet greater than or equal to 90% of their needs  MONITOR:   Diet advancement, Labs, Weight trends, I & O's  REASON FOR ASSESSMENT:   Consult Hip fracture protocol  ASSESSMENT:   82 year old female with history of coronary artery disease status post CABG in June 2017, postop transient A. fib, AAA, COPD, was brought to the emergency room after having a mechanical fall and was found to have a hip fracture.  Orthopedic surgery was consulted and plans to take patient to the operating room either later tonight if the schedule allows or tomorrow morning.  Patient in room with son at bedside. Pt reports eating normally PTA. Usually consumes 2 meals a day (late breakfast, early dinner). She states she will drink 1 Ensure supplement a day, as well. Pt currently NPO for pending hip surgery. Pt would like Ensure supplements once diet is advanced. Encouraged pt to drink at least 2 Ensure supplements a day after her surgery for at least a month.  Per chart review, pt's weight has remained stable with some weight gain. Nutrition focused physical exam shows no sign of depletion of muscle mass or body fat.  Medications reviewed. Labs reviewed: Low Na GFR: 40  Diet Order:  Diet NPO time specified Diet NPO time specified Except for: Sips with Meds  Skin:  Reviewed, no issues  Last BM:  5/21  Height:   Ht Readings from Last 1 Encounters:  09/23/16 5\' 1"  (1.549 m)    Weight:   Wt Readings from Last 1 Encounters:  09/23/16 113 lb (51.3 kg)    Ideal Body Weight:  47.7 kg  BMI:  Body mass index is 21.35 kg/m.  Estimated  Nutritional Needs:   Kcal:  1450-1650  Protein:  65-75g  Fluid:  1.6L/day  EDUCATION NEEDS:   Education needs addressed  Tilda FrancoLindsey Rishik Tubby, MS, RD, LDN Pager: [phone] After Hours Pager: [phone]

## 2016-09-24 NOTE — Progress Notes (Signed)
0021 Dr. Toniann FailKakrakandy paged for HR of 127. Pt resting comfortably, pain now controlled. Received return phone call at 0024 with orders to obtain EKG.

## 2016-09-24 NOTE — Progress Notes (Signed)
Pt transferred to room 1412, telemetry unit. See orders per Dr. Toniann FailKakrakandy.

## 2016-09-24 NOTE — Anesthesia Preprocedure Evaluation (Addendum)
Anesthesia Evaluation  Patient identified by MRN, date of birth, ID band Patient awake    Reviewed: Allergy & Precautions, NPO status , Patient's Chart, lab work & pertinent test results  Airway Mallampati: II  TM Distance: >3 FB Neck ROM: Full    Dental  (+) Teeth Intact, Dental Advisory Given   Pulmonary COPD, former smoker,    breath sounds clear to auscultation       Cardiovascular hypertension, Pt. on home beta blockers and Pt. on medications + CAD, + Past MI, + CABG and + Peripheral Vascular Disease  + dysrhythmias Atrial Fibrillation  Rhythm:Regular Rate:Normal     Neuro/Psych PSYCHIATRIC DISORDERS Anxiety Depression TIA   GI/Hepatic negative GI ROS, Neg liver ROS,   Endo/Other  negative endocrine ROS  Renal/GU CRFRenal disease  negative genitourinary   Musculoskeletal negative musculoskeletal ROS (+)   Abdominal   Peds negative pediatric ROS (+)  Hematology negative hematology ROS (+)   Anesthesia Other Findings   Reproductive/Obstetrics negative OB ROS                            Lab Results  Component Value Date   WBC 13.0 (H) 09/24/2016   HGB 12.9 09/24/2016   HCT 37.9 09/24/2016   MCV 94.3 09/24/2016   PLT 259 09/24/2016   Lab Results  Component Value Date   CREATININE 1.25 (H) 09/24/2016   BUN 16 09/24/2016   NA 133 (L) 09/24/2016   K 4.0 09/24/2016   CL 100 (L) 09/24/2016   CO2 23 09/24/2016   Lab Results  Component Value Date   INR 1.51 (H) 10/16/2015   INR 1.07 10/15/2015   INR 1.02 10/06/2015   Echo: - Left ventricle: The cavity size was normal. Systolic function was   normal. Wall motion was normal; there were no regional wall   motion abnormalities. - Pulmonary arteries: PA peak pressure: 34 mm Hg (S).   EKG: normal sinus rhythm.   Anesthesia Physical Anesthesia Plan  ASA: III  Anesthesia Plan: Spinal   Post-op Pain Management:     Induction: Intravenous  Airway Management Planned: Natural Airway  Additional Equipment:   Intra-op Plan:   Post-operative Plan: Extubation in OR  Informed Consent: I have reviewed the patients History and Physical, chart, labs and discussed the procedure including the risks, benefits and alternatives for the proposed anesthesia with the patient or authorized representative who has indicated his/her understanding and acceptance.   Dental advisory given  Plan Discussed with: CRNA  Anesthesia Plan Comments:        Anesthesia Quick Evaluation

## 2016-09-24 NOTE — Consult Note (Signed)
Reason for Consult:  Left hip fracture Referring Physician:  Elvera LennoxGherghe, MD  Tonya AlmSuann Batte is an 82 y.o. female.  HPI: Tonya Payne is a 82 y.o. female with history of CAD status post stenting and CABG, postoperative atrial fibrillation, abdominal aortic aneurysm, COPD was brought to the ER after patient had a fall at her living facility. Patient states she was walking from the kitchen when she suddenly tripped and fell. She did hit her head but did not lose consciousness. Was complaining of pain in the left hip  When seen and evaluated this am primary complaints continued to be her left hip.  No upper extremity complaints  Past Medical History:  Diagnosis Date  . AAA (abdominal aortic aneurysm) without rupture (HCC) 2016   CTA 10/23/2015 - saccular infrarenal aneurysm roughly 4.8 cm in diameter. Stable from 2016 -- Dr. Darrick PennaFields  . Anxiety   . Atrial fibrillation, new onset (HCC) 10/2015   after CABG, in SR at d/c  . CAD (coronary artery disease), native coronary artery 10/08/2015   Severe coronary disease with calcified left main and LAD: 5% left main going into ostial LAD. Mid LAD 95% (treated with PTCA). Proximal RCA 50%, calcified. EF was 20-25% with apical akinesis and distal anterior hypokinesis. --> Referred for CABG  . Congestive dilated cardiomyopathy (HCC) 10/08/2015   Intra-Op TEE showed EF of 40 and 45%. She had an echo with a "normal LV function "documented, but there was no reading M.D.  . COPD (chronic obstructive pulmonary disease) (HCC)   . Depression   . History of acute anterior wall MI 10/08/2015   Presented with severe chest pain and dynamic anterior ST elevations/biphasic elevations area did not meet full criteria for STEMI, and troponin was not dramatically elevated. 95% LAD treated with PTCA followed by CABG  . Hyperlipidemia LDL goal <70 10/08/2015  . IBS (irritable bowel syndrome)   . Oral thrush   . Post PTCA 10/08/2015   Presented with STEMI, severe mid LAD lesion  treated with PTCA and then referred for CABG.  . TIA (transient ischemic attack)     Past Surgical History:  Procedure Laterality Date  . BACK SURGERY    . CARDIAC CATHETERIZATION N/A 10/06/2015   Procedure: Left Heart Cath and Coronary Angiography;  Surgeon: Marykay Lexavid W Harding, MD;  [facility];  Service: Cardiovascular;: Heavily calcified left main-LAD. 75% LM & Ost LAD, 95% d-mLAD, p-mRCA Calcified 50%. EF ~20-25%  . CARDIAC CATHETERIZATION N/A 10/06/2015   Procedure: Coronary Balloon Angioplasty;  Surgeon: Marykay Lexavid W Harding, MD;  [facility];  Service: Cardiovascular: PTCA of 95% LAD in setting of STEMI - reduced to 70% --> would need Atherectomy-PCI vs. CABG.  Sent for CABG.  . COLECTOMY    . CORONARY ARTERY BYPASS GRAFT N/A 10/16/2015   Procedure: CORONARY ARTERY BYPASS GRAFTING (CABG)  x three, using left internal mammary artery and right leg greater saphenous vein harvested endoscopically;  Surgeon: Loreli SlotSteven C Hendrickson, MD;  [facility];  Service: Open Heart Surgery;  Laterality: N/A;  . NECK SURGERY    . TEE WITHOUT CARDIOVERSION N/A 10/16/2015   Procedure: TRANSESOPHAGEAL ECHOCARDIOGRAM (TEE);  Surgeon: Loreli SlotSteven C Hendrickson, MD;  [facility];  Service: Open Heart Surgery - IntraOp:  EF 40-45%.    Family History  Problem Relation Age of Onset  . Stroke Mother        dead  . Clotting disorder Father        dead  bone marrow disease    Social History:  reports that she quit smoking about 17 months ago. Her smoking use included Cigarettes. She smoked 0.50 packs per day. She has never used smokeless tobacco. She reports that she drinks about 1.2 oz of alcohol per week . She reports that she does not use drugs.  Allergies: No Known Allergies  Medications: I have reviewed the patient's current medications.  Results for orders placed or performed during the hospital encounter of 09/23/16 (from the past 24 hour(s))  CBC with Differential/Platelet      Status: Abnormal   Collection Time: 09/23/16  6:10 PM  Result Value Ref Range   WBC 9.4 4.0 - 10.5 K/uL   RBC 3.93 3.87 - 5.11 MIL/uL   Hemoglobin 12.7 12.0 - 15.0 g/dL   HCT 16.1 09.6 - 04.5 %   MCV 93.1 78.0 - 100.0 fL   MCH 32.3 26.0 - 34.0 pg   MCHC 34.7 30.0 - 36.0 g/dL   RDW 40.9 81.1 - 91.4 %   Platelets 244 150 - 400 K/uL   Neutrophils Relative % 76 %   Neutro Abs 7.2 1.7 - 7.7 K/uL   Lymphocytes Relative 11 %   Lymphs Abs 1.0 0.7 - 4.0 K/uL   Monocytes Relative 12 %   Monocytes Absolute 1.1 (H) 0.1 - 1.0 K/uL   Eosinophils Relative 1 %   Eosinophils Absolute 0.1 0.0 - 0.7 K/uL   Basophils Relative 0 %   Basophils Absolute 0.0 0.0 - 0.1 K/uL  Comprehensive metabolic panel     Status: Abnormal   Collection Time: 09/23/16  6:10 PM  Result Value Ref Range   Sodium 129 (L) 135 - 145 mmol/L   Potassium 4.3 3.5 - 5.1 mmol/L   Chloride 95 (L) 101 - 111 mmol/L   CO2 22 22 - 32 mmol/L   Glucose, Bld 104 (H) 65 - 99 mg/dL   BUN 19 6 - 20 mg/dL   Creatinine, Ser 7.82 (H) 0.44 - 1.00 mg/dL   Calcium 9.4 8.9 - 95.6 mg/dL   Total Protein 7.6 6.5 - 8.1 g/dL   Albumin 4.2 3.5 - 5.0 g/dL   AST 31 15 - 41 U/L   ALT 21 14 - 54 U/L   Alkaline Phosphatase 120 38 - 126 U/L   Total Bilirubin 0.8 0.3 - 1.2 mg/dL   GFR calc non Af Amer 32 (L) >60 mL/min   GFR calc Af Amer 38 (L) >60 mL/min   Anion gap 12 5 - 15  I-stat troponin, ED     Status: None   Collection Time: 09/23/16  6:25 PM  Result Value Ref Range   Troponin i, poc 0.01 0.00 - 0.08 ng/mL   Comment 3          Urinalysis, Routine w reflex microscopic     Status: Abnormal   Collection Time: 09/24/16 12:01 AM  Result Value Ref Range   Color, Urine STRAW (A) YELLOW   APPearance CLEAR CLEAR   Specific Gravity, Urine 1.006 1.005 - 1.030   pH 7.0 5.0 - 8.0   Glucose, UA 50 (A) NEGATIVE mg/dL   Hgb urine dipstick NEGATIVE NEGATIVE   Bilirubin Urine NEGATIVE NEGATIVE   Ketones, ur 5 (A) NEGATIVE mg/dL   Protein, ur NEGATIVE  NEGATIVE mg/dL   Nitrite NEGATIVE NEGATIVE   Leukocytes, UA NEGATIVE NEGATIVE  Surgical PCR screen     Status: None   Collection Time: 09/24/16 12:01 AM  Result Value Ref  Range   MRSA, PCR NEGATIVE NEGATIVE   Staphylococcus aureus NEGATIVE NEGATIVE  CBC     Status: Abnormal   Collection Time: 09/24/16 12:02 AM  Result Value Ref Range   WBC 13.0 (H) 4.0 - 10.5 K/uL   RBC 4.02 3.87 - 5.11 MIL/uL   Hemoglobin 12.9 12.0 - 15.0 g/dL   HCT 16.1 09.6 - 04.5 %   MCV 94.3 78.0 - 100.0 fL   MCH 32.1 26.0 - 34.0 pg   MCHC 34.0 30.0 - 36.0 g/dL   RDW 40.9 81.1 - 91.4 %   Platelets 259 150 - 400 K/uL  Comprehensive metabolic panel     Status: Abnormal   Collection Time: 09/24/16 12:02 AM  Result Value Ref Range   Sodium 133 (L) 135 - 145 mmol/L   Potassium 4.0 3.5 - 5.1 mmol/L   Chloride 100 (L) 101 - 111 mmol/L   CO2 23 22 - 32 mmol/L   Glucose, Bld 129 (H) 65 - 99 mg/dL   BUN 16 6 - 20 mg/dL   Creatinine, Ser 7.82 (H) 0.44 - 1.00 mg/dL   Calcium 9.0 8.9 - 95.6 mg/dL   Total Protein 6.9 6.5 - 8.1 g/dL   Albumin 3.9 3.5 - 5.0 g/dL   AST 31 15 - 41 U/L   ALT 19 14 - 54 U/L   Alkaline Phosphatase 109 38 - 126 U/L   Total Bilirubin 0.8 0.3 - 1.2 mg/dL   GFR calc non Af Amer 40 (L) >60 mL/min   GFR calc Af Amer 46 (L) >60 mL/min   Anion gap 10 5 - 15  ABO/Rh     Status: None   Collection Time: 09/24/16 12:10 AM  Result Value Ref Range   ABO/RH(D) A POS   Type and screen [facility]     Status: None   Collection Time: 09/24/16 12:11 AM  Result Value Ref Range   ABO/RH(D) A POS    Antibody Screen NEG    Sample Expiration 09/27/2016     X-ray: CLINICAL DATA:  Fall with pain in both hips  EXAM: PELVIS - 1-2 VIEW  COMPARISON:  01/26/2016  FINDINGS: SI joints symmetric. Postsurgical changes in the pelvis. Calcified pelvic phleboliths. Pubic symphysis appears intact. No dislocation. Old right superior pubic ramus fracture.  Possible step-off deformity  at the left subcapital femur.  IMPRESSION: 1. Possible step-off deformity within the subcapital left femur, recommend CT for further evaluation. 2. Old appearing right superior pubic ramus fracture   Electronically Signed   By: Jasmine Pang M.D.  CLINICAL DATA:  Evaluate for hip fracture.  EXAM: CT PELVIS WITHOUT CONTRAST  TECHNIQUE: Multidetector CT imaging of the pelvis was performed following the standard protocol without intravenous contrast.  COMPARISON:  01/26/2016.  FINDINGS: Urinary Tract:  No abnormality visualized.  Bowel:  Unremarkable visualized pelvic bowel loops.  Vascular/Lymphatic: Aortic atherosclerosis. Infrarenal abdominal aortic aneurysm measures 4.9 cm, image number 1 of series 4. This is increased from 4.7 cm previously. No adenopathy identified.  Reproductive:  No mass or other significant abnormality  Other:  None.  Musculoskeletal: The bones appear diffusely osteopenic. There is an acute slightly impacted subcapital left femoral neck fracture, image 51 of series 6. No dislocation.  IMPRESSION: 1. Acute slightly impacted subcapital femoral neck fracture involves the left hip. 2. Aortic aneurysm NOS (ICD10-I71.9). This measures 4.9 cm. Recommend followup by abdomen and pelvis CTA in 6 months, and vascular surgery referral/consultation if not already obtained. This recommendation  follows ACR consensus guidelines: White Paper of the ACR Incidental Findings Committee II on Vascular Findings. J Am Coll Radiol 2013; 46:962-952. 3.  Aortic Atherosclerosis (ICD10-I70.0). 4. These results will be called to the ordering clinician or representative by the Radiologist Assistant, and communication documented in the PACS or zVision Dashboard.   Electronically Signed   By: Signa Kell M.D.  ROS  Blood pressure 123/69, pulse (!) 101, temperature 97.6 F (36.4 C), temperature source Oral, resp. rate 18, height 5\' 1"  (1.549 m),  weight 51.3 kg (113 lb), SpO2 95 %.  Physical Exam  Awake alert Son in room Pain with movement left lower extremity Neutral aligned left lower extremity, no significant shortening NVI Right tender but moves without pain  Assessment/Plan: Non displaced left subcapital femoral neck fracture Reviewed options, risks with plan options NPO Plan to go to OR this evening for closed reduction percutaneous screw fixation of left hip fracture Post operative course reviewed   Fabian Coca D 09/24/2016, 8:49 AM

## 2016-09-24 NOTE — Brief Op Note (Signed)
09/23/2016 - 09/24/2016  6:38 PM  PATIENT:  Tonya AlmSuann Geibel  82 y.o. female  PRE-OPERATIVE DIAGNOSIS:  Left non displaced  femoral neck fracture  POST-OPERATIVE DIAGNOSIS:  Left non displaced  femoral neck fracture  PROCEDURE:  Procedure(s): CANNULATED HIP PINNING LEFT HIP (Left)  SURGEON:  Surgeon(s) and Role:    Durene Romans* Kyuss Hale, MD - Primary  PHYSICIAN ASSISTANT: None  ANESTHESIA:   spinal  EBL:  Total I/O In: 997.5 [P.O.:60; I.V.:937.5] Out: 150 [Urine:150] <50 cc  BLOOD ADMINISTERED:none  DRAINS: none   LOCAL MEDICATIONS USED:  NONE  SPECIMEN:  No Specimen  DISPOSITION OF SPECIMEN:  N/A  COUNTS:  YES  TOURNIQUET:  * No tourniquets in log *  DICTATION: .Other Dictation: Dictation Number [id number]  PLAN OF CARE: Admit to inpatient   PATIENT DISPOSITION:  PACU - hemodynamically stable.   Delay start of Pharmacological VTE agent (>24hrs) due to surgical blood loss or risk of bleeding: no

## 2016-09-24 NOTE — Progress Notes (Signed)
PROGRESS NOTE  Tonya Payne WUJ:[id number] DOB: [birth year] DOA: 09/23/2016 PCP: Rocky Morel, MD   LOS: 1 day   Brief Narrative: 82 year old female with history of coronary artery disease status post CABG in June 2017, postop transient A. fib, AAA, COPD, was brought to the emergency room after having a mechanical fall and was found to have a hip fracture.  Orthopedic surgery was consulted and plans to take patient to the operating room either later tonight if the schedule allows or tomorrow morning.  Assessment & Plan: Active Problems:   Coronary artery disease involving native coronary artery with angina pectoris (HCC)   S/P CABG x 3   AAA (abdominal aortic aneurysm) without rupture (HCC)   Hip fracture (HCC)   Hyponatremia   Hypertension   Left displaced femoral neck fracture (HCC)   CKD (chronic kidney disease), stage III   Left hip fracture status post mechanical fall -Patient is at moderate risk for the procedure.  She has a history of heart disease status post CABG, this appears stable.  She denies any chest pains or any other symptoms with activities at home, denies any lower extremity swelling and otherwise appears stable for surgery.  Coronary artery disease status post CABG -Her chest pain equivalent was left arm and left hand pain which were present prior to her MI and CABG, she has not had any of these since her surgery.  She is able to do her ADLs using a walker without any exertional dyspnea or arm pain.  Hypertension -Continue home medications  Sinus tachycardia -Continue metoprolol, likely due to pain.  Monitor on telemetry for recurrence of A. fib, if this is happening again may need anticoagulation  History of atrial fibrillation -This apparently happened in the setting of her CABG, probably transient, not on anticoagulation currently  AAA -Outpatient management and surveillance.   Hyponatremia -Improving with fluids, continue  Chronic kidney disease  stage III -Creatinine appears close to baseline   DVT prophylaxis: Per orthopedic surgery Code Status: DNR Family Communication: Family at bedside Disposition Plan: Home versus SNF when ready  Consultants:   Orthopedic surgery  Procedures:   None   Antimicrobials:  None    Subjective: - no chest pain, shortness of breath, no abdominal pain, nausea or vomiting.   Objective: Vitals:   09/23/16 2300 09/24/16 0212 09/24/16 0227 09/24/16 0534  BP: (!) 147/71 (!) 97/58 (!) 154/78 123/69  Pulse: (!) 131 (!) 105 (!) 108 (!) 101  Resp: 18 16 18 18   Temp: 98.1 F (36.7 C)  98.7 F (37.1 C) 97.6 F (36.4 C)  TempSrc: Oral  Oral Oral  SpO2: 94% 92% 94% 95%  Weight:      Height:        Intake/Output Summary (Last 24 hours) at 09/24/16 1000 Last data filed at 09/24/16 0855  Gross per 24 hour  Intake              120 ml  Output              300 ml  Net             -180 ml   Filed Weights   09/23/16 1927  Weight: 51.3 kg (113 lb)    Examination:  Vitals:   09/23/16 2300 09/24/16 0212 09/24/16 0227 09/24/16 0534  BP: (!) 147/71 (!) 97/58 (!) 154/78 123/69  Pulse: (!) 131 (!) 105 (!) 108 (!) 101  Resp: 18 16 18 18   Temp: 98.1 F (36.7 C)  98.7 F (37.1 C) 97.6 F (36.4 C)  TempSrc: Oral  Oral Oral  SpO2: 94% 92% 94% 95%  Weight:      Height:        Constitutional: NAD Neck: normal, supple Respiratory: clear to auscultation bilaterally, no wheezing, no crackles.  Cardiovascular: Regular rate and rhythm, no murmurs / rubs / gallops.  Abdomen: + tenderness throughout, no rebound. Bowel sounds positive.  Musculoskeletal: no clubbing / cyanosis. No joint deformity upper and lower extremities.  Neurologic: non focal    Data Reviewed: I have personally reviewed following labs and imaging studies  CBC:  Recent Labs Lab 09/23/16 1810 09/24/16 0002  WBC 9.4 13.0*  NEUTROABS 7.2  --   HGB 12.7 12.9  HCT 36.6 37.9  MCV 93.1 94.3  PLT 244 259   Basic  Metabolic Panel:  Recent Labs Lab 09/23/16 1810 09/24/16 0002  NA 129* 133*  K 4.3 4.0  CL 95* 100*  CO2 22 23  GLUCOSE 104* 129*  BUN 19 16  CREATININE 1.47* 1.25*  CALCIUM 9.4 9.0   GFR: Estimated Creatinine Clearance: 27.1 mL/min (A) (by C-G formula based on SCr of 1.25 mg/dL (H)). Liver Function Tests:  Recent Labs Lab 09/23/16 1810 09/24/16 0002  AST 31 31  ALT 21 19  ALKPHOS 120 109  BILITOT 0.8 0.8  PROT 7.6 6.9  ALBUMIN 4.2 3.9   No results for input(s): LIPASE, AMYLASE in the last 168 hours. No results for input(s): AMMONIA in the last 168 hours. Coagulation Profile: No results for input(s): INR, PROTIME in the last 168 hours. Cardiac Enzymes: No results for input(s): CKTOTAL, CKMB, CKMBINDEX, TROPONINI in the last 168 hours. BNP (last 3 results) No results for input(s): PROBNP in the last 8760 hours. HbA1C: No results for input(s): HGBA1C in the last 72 hours. CBG: No results for input(s): GLUCAP in the last 168 hours. Lipid Profile: No results for input(s): CHOL, HDL, LDLCALC, TRIG, CHOLHDL, LDLDIRECT in the last 72 hours. Thyroid Function Tests: No results for input(s): TSH, T4TOTAL, FREET4, T3FREE, THYROIDAB in the last 72 hours. Anemia Panel: No results for input(s): VITAMINB12, FOLATE, FERRITIN, TIBC, IRON, RETICCTPCT in the last 72 hours. Urine analysis:    Component Value Date/Time   COLORURINE STRAW (A) 09/24/2016 0001   APPEARANCEUR CLEAR 09/24/2016 0001   LABSPEC 1.006 09/24/2016 0001   PHURINE 7.0 09/24/2016 0001   GLUCOSEU 50 (A) 09/24/2016 0001   HGBUR NEGATIVE 09/24/2016 0001   BILIRUBINUR NEGATIVE 09/24/2016 0001   KETONESUR 5 (A) 09/24/2016 0001   PROTEINUR NEGATIVE 09/24/2016 0001   UROBILINOGEN 0.2 02/25/2015 1826   NITRITE NEGATIVE 09/24/2016 0001   LEUKOCYTESUR NEGATIVE 09/24/2016 0001   Sepsis Labs: Invalid input(s): PROCALCITONIN, LACTICIDVEN  Recent Results (from the past 240 hour(s))  Surgical PCR screen     Status:  None   Collection Time: 09/24/16 12:01 AM  Result Value Ref Range Status   MRSA, PCR NEGATIVE NEGATIVE Final   Staphylococcus aureus NEGATIVE NEGATIVE Final    Comment:        The Xpert SA Assay (FDA approved for NASAL specimens in patients over 38 years of age), is one component of a comprehensive surveillance program.  Test performance has been validated by [facility] for patients greater than or equal to 1 year old. It is not intended to diagnose infection nor to guide or monitor treatment.       Radiology Studies: Dg Chest 2 View  Result Date: 09/23/2016 CLINICAL DATA:  Fall  EXAM: CHEST  2 VIEW COMPARISON:  06/05/2016 FINDINGS: Lower cervical hardware. Post sternotomy changes of the mediastinum. Lungs appear hyperinflated. There is no focal consolidation or effusion. Mild fibrosis at the lung bases. No pneumothorax. Old appearing right eighth and tenth rib fractures. IMPRESSION: 1. Bibasilar fibrosis. No acute infiltrate, pleural effusion or pneumothorax. 2. Old appearing right rib fractures Electronically Signed   By: Jasmine Pang M.D.   On: 09/23/2016 18:04   Dg Pelvis 1-2 Views  Result Date: 09/23/2016 CLINICAL DATA:  Fall with pain in both hips EXAM: PELVIS - 1-2 VIEW COMPARISON:  01/26/2016 FINDINGS: SI joints symmetric. Postsurgical changes in the pelvis. Calcified pelvic phleboliths. Pubic symphysis appears intact. No dislocation. Old right superior pubic ramus fracture. Possible step-off deformity at the left subcapital femur. IMPRESSION: 1. Possible step-off deformity within the subcapital left femur, recommend CT for further evaluation. 2. Old appearing right superior pubic ramus fracture Electronically Signed   By: Jasmine Pang M.D.   On: 09/23/2016 18:06   Dg Tibia/fibula Left  Result Date: 09/23/2016 CLINICAL DATA:  Fall with laceration EXAM: LEFT TIBIA AND FIBULA - 2 VIEW COMPARISON:  None. FINDINGS: Vascular calcifications. No acute displaced fracture or  malalignment. No radiopaque foreign body. IMPRESSION: No acute osseous abnormality Electronically Signed   By: Jasmine Pang M.D.   On: 09/23/2016 18:07   Ct Head Wo Contrast  Result Date: 09/23/2016 CLINICAL DATA:  Recent trip and fall with pain, initial encounter EXAM: CT HEAD WITHOUT CONTRAST CT CERVICAL SPINE WITHOUT CONTRAST TECHNIQUE: Multidetector CT imaging of the head and cervical spine was performed following the standard protocol without intravenous contrast. Multiplanar CT image reconstructions of the cervical spine were also generated. COMPARISON:  06/05/2016 FINDINGS: CT HEAD FINDINGS Brain: Atrophic changes and chronic white matter ischemic changes are seen. No findings to suggest acute hemorrhage, acute infarction or space-occupying mass lesion are seen. Changes of prior right occipital infarct are noted. Prior lacunar infarct is also noted in the region of the basal ganglia on the left stable from the prior exam. Vascular: No hyperdense vessel or unexpected calcification. Skull: Normal. Negative for fracture or focal lesion. Sinuses/Orbits: No acute finding. Other: None. CT CERVICAL SPINE FINDINGS Alignment: Mild anterolisthesis of C7 on T1 stable from the prior exam. Skull base and vertebrae: 7 cervical segments are well visualized. Changes of prior fusion are noted at C5-6 and C6-7. Facet hypertrophic changes are noted. No acute fracture or acute facet abnormality is noted. Soft tissues and spinal canal: No acute soft tissue abnormality is noted. Diffuse vascular calcifications are seen. Upper chest: Stable scarring in the right upper lobe is noted. Mild emphysematous changes are seen. IMPRESSION: CT of the head: Chronic changes without acute abnormality. CT of the cervical spine: Postsurgical and degenerative changes stable from the prior exam. Electronically Signed   By: Alcide Clever M.D.   On: 09/23/2016 18:59   Ct Cervical Spine Wo Contrast  Result Date: 09/23/2016 CLINICAL DATA:   Recent trip and fall with pain, initial encounter EXAM: CT HEAD WITHOUT CONTRAST CT CERVICAL SPINE WITHOUT CONTRAST TECHNIQUE: Multidetector CT imaging of the head and cervical spine was performed following the standard protocol without intravenous contrast. Multiplanar CT image reconstructions of the cervical spine were also generated. COMPARISON:  06/05/2016 FINDINGS: CT HEAD FINDINGS Brain: Atrophic changes and chronic white matter ischemic changes are seen. No findings to suggest acute hemorrhage, acute infarction or space-occupying mass lesion are seen. Changes of prior right occipital infarct are noted. Prior lacunar infarct  is also noted in the region of the basal ganglia on the left stable from the prior exam. Vascular: No hyperdense vessel or unexpected calcification. Skull: Normal. Negative for fracture or focal lesion. Sinuses/Orbits: No acute finding. Other: None. CT CERVICAL SPINE FINDINGS Alignment: Mild anterolisthesis of C7 on T1 stable from the prior exam. Skull base and vertebrae: 7 cervical segments are well visualized. Changes of prior fusion are noted at C5-6 and C6-7. Facet hypertrophic changes are noted. No acute fracture or acute facet abnormality is noted. Soft tissues and spinal canal: No acute soft tissue abnormality is noted. Diffuse vascular calcifications are seen. Upper chest: Stable scarring in the right upper lobe is noted. Mild emphysematous changes are seen. IMPRESSION: CT of the head: Chronic changes without acute abnormality. CT of the cervical spine: Postsurgical and degenerative changes stable from the prior exam. Electronically Signed   By: Alcide CleverMark  Lukens M.D.   On: 09/23/2016 18:59   Ct Pelvis Wo Contrast  Result Date: 09/23/2016 CLINICAL DATA:  Evaluate for hip fracture. EXAM: CT PELVIS WITHOUT CONTRAST TECHNIQUE: Multidetector CT imaging of the pelvis was performed following the standard protocol without intravenous contrast. COMPARISON:  01/26/2016. FINDINGS: Urinary  Tract:  No abnormality visualized. Bowel:  Unremarkable visualized pelvic bowel loops. Vascular/Lymphatic: Aortic atherosclerosis. Infrarenal abdominal aortic aneurysm measures 4.9 cm, image number 1 of series 4. This is increased from 4.7 cm previously. No adenopathy identified. Reproductive:  No mass or other significant abnormality Other:  None. Musculoskeletal: The bones appear diffusely osteopenic. There is an acute slightly impacted subcapital left femoral neck fracture, image 51 of series 6. No dislocation. IMPRESSION: 1. Acute slightly impacted subcapital femoral neck fracture involves the left hip. 2. Aortic aneurysm NOS (ICD10-I71.9). This measures 4.9 cm. Recommend followup by abdomen and pelvis CTA in 6 months, and vascular surgery referral/consultation if not already obtained. This recommendation follows ACR consensus guidelines: White Paper of the ACR Incidental Findings Committee II on Vascular Findings. J Am Coll Radiol 2013; 40:981-191; 10:789-794. 3.  Aortic Atherosclerosis (ICD10-I70.0). 4. These results will be called to the ordering clinician or representative by the Radiologist Assistant, and communication documented in the PACS or zVision Dashboard. Electronically Signed   By: Signa Kellaylor  Stroud M.D.   On: 09/23/2016 19:29   Dg Knee Complete 4 Views Right  Result Date: 09/23/2016 CLINICAL DATA:  Fall with pain to the anterior knee EXAM: RIGHT KNEE - COMPLETE 4+ VIEW COMPARISON:  09/21/2015 FINDINGS: Status post wire fixation of patella with fracture through the left upper limb as before. No acute displaced fracture or malalignment. Mild medial and lateral degenerative changes. No large effusion. Vascular calcifications. IMPRESSION: Postsurgical changes of the patella. No definite acute osseous abnormality. Electronically Signed   By: Jasmine PangKim  Fujinaga M.D.   On: 09/23/2016 18:10   Dg Abd Portable 1v  Result Date: 09/24/2016 CLINICAL DATA:  Abdominal pain.  Left hip fracture. EXAM: PORTABLE ABDOMEN - 1  VIEW COMPARISON:  CT scan of the pelvis dated 09/23/2016 FINDINGS: The bowel gas pattern is normal. There is a fusiform abdominal aortic aneurysm which measures 6.5 cm, uncorrected for magnification. No visible free air or free fluid in the abdomen. Left proximal femoral fracture is noted. Chronic interstitial disease is noted at both lung bases. IMPRESSION: 6.5 cm abdominal aortic aneurysm. Left hip fracture. Otherwise benign appearing abdomen and pelvis. Electronically Signed   By: Francene BoyersJames  Maxwell M.D.   On: 09/24/2016 09:27     Scheduled Meds: . atorvastatin  40 mg Oral q1800  .  chlorhexidine  60 mL Topical Once  . loratadine  10 mg Oral Daily  . metoprolol tartrate  12.5 mg Oral BID  . OLANZapine  5 mg Oral QHS  . povidone-iodine  2 application Topical Once  . sertraline  50 mg Oral q morning - 10a   Continuous Infusions: . sodium chloride    . sodium chloride    .  ceFAZolin (ANCEF) IV      Pamella Pert, MD, PhD [facility] Pager [phone]  If 7PM-7AM, please contact night-coverage [email or web address] Password [email or web address] 09/24/2016, 10:00 AM

## 2016-09-24 NOTE — Anesthesia Procedure Notes (Signed)
Spinal  Patient location during procedure: OR Start time: 09/24/2016 6:36 PM End time: 09/24/2016 6:38 PM Staffing Anesthesiologist: Suella Broad D Performed: anesthesiologist  Preanesthetic Checklist Completed: patient identified, site marked, surgical consent, pre-op evaluation, timeout performed, IV checked, risks and benefits discussed and monitors and equipment checked Spinal Block Patient position: sitting Prep: Betadine Patient monitoring: heart rate, continuous pulse ox, blood pressure and cardiac monitor Approach: midline Location: L4-5 Injection technique: single-shot Needle Needle type: Introducer and Pencan  Needle gauge: 24 G Needle length: 9 cm Additional Notes Negative paresthesia. Negative blood return. Positive free-flowing CSF. Expiration date of kit checked and confirmed. Patient tolerated procedure well, without complications.

## 2016-09-24 NOTE — Care Management Note (Signed)
Case Management Note  Patient Details  Name: Julio AlmSuann Kearley MRN: [id number] Date of Birth: 13-Jun-1934  Subjective/Objective:82 y/o f admitted w/L hip fx. From home alone. Ortho-surgery today. Will put in PT cons. CSW already following.Likely need SNF @ d/c.                    Action/Plan:d/c plan SNF.   Expected Discharge Date:   (unknown)               Expected Discharge Plan:  Skilled Nursing Facility  In-House Referral:  Clinical Social Work  Discharge planning Services  CM Consult  Post Acute Care Choice:    Choice offered to:     DME Arranged:    DME Agency:     HH Arranged:    HH Agency:     Status of Service:  In process, will continue to follow  If discussed at Long Length of Stay Meetings, dates discussed:    Additional Comments:  Lanier ClamMahabir, Karrah Mangini, RN 09/24/2016, 1:20 PM

## 2016-09-24 NOTE — Progress Notes (Signed)
PT Cancellation Note  Patient Details Name: Tonya AlmSuann Payne MRN: [id number] DOB: 13-Nov-1934   Cancelled Treatment:    Reason Eval/Treat Not Completed: Patient not medically ready--Scheduled for surgery. Will sign off and await ortho MD orders/recommendations. Thanks.    Rebeca AlertJannie Rosene Pilling, MPT Pager: [phone]

## 2016-09-24 NOTE — Op Note (Signed)
NAMERASEEL, JANS NO.:  [id number]  MEDICAL RECORD NO.:  [id number]  LOCATION:  1412                         FACILITY:  [facility]  PHYSICIAN:  Madlyn Frankel. Charlann Boxer, M.D.  DATE OF BIRTH:  22-Oct-1934  DATE OF PROCEDURE:  09/24/2016 DATE OF DISCHARGE:                              OPERATIVE REPORT   PREOPERATIVE DIAGNOSIS:  Nondisplaced left subcapital femoral neck fracture.  POSTOPERATIVE DIAGNOSIS:  Nondisplaced left subcapital femoral neck fracture.  PROCEDURE:  Closed reduction and percutaneous cannulated screw fixation of the left hip fracture using Biomet 8.0-mm screws with 16-mm threads.  SURGEON:  Madlyn Frankel. Charlann Boxer, M.D.  ASSISTANT:  Surgical team.  ANESTHESIA:  Spinal.  SPECIMENS:  None.  COMPLICATION:  None.  BLOOD LOSS:  50 mL.  INDICATIONS FOR PROCEDURE:  Ms. Rolfson is an 82 year old female who unfortunately had stumble and fall on her kitchen, landing on her left hip.  She had immediate onset of pain.  She had a difficult time trying to weightbear, was brought to the emergency room.  At that time, radiographs indicated slight step-off.  CT scan confirmed a fracture. She was admitted to the Hospitalist Service.  She was seen and evaluated by Orthopedics for management.  I reviewed pros and cons, risks and benefits of treatment options given the nondisplaced nature of her fracture pattern and normal alignment of her lower extremity.  We discussed the possibility of malunion, nonunion, need for surgery after percutaneous cannulated screw fixation and opted for this.  Risks of infection and DVT reviewed as well.  Consent was obtained for benefit of fracture management and pain control.  PROCEDURE IN DETAIL:  The patient was brought to the operative theater. Once adequate anesthesia, preoperative antibiotics, Ancef administered, she was positioned supine on the Hana table.  Once adequately positioned with bony prominences padded, fluoroscopy was  used to confirm fracture reduction and maintenance.  Once this was done, the left hip was prepped and draped in sterile fashion using shower curtain technique.  A time- out was performed identifying the patient, planned procedure and extremity.  Fluoroscopy was brought back to the field and landmarks were identified including the lesser trochanter and angles of the of the head- neck junction as well as in AP and lateral planes.  At this point, I placed three guidewires; one inferior slightly posterior, one proximal posterior and one proximal anterior.  Once this was confirmed in orientation of AP and lateral planes, I measured the depth and placed, then the 8.0-mm screws with 16-mm threads through the subcapital nature of the fracture, the screws then had great purchase along her lateral cortex.  Once this was performed, the final radiographs were obtained in AP and lateral planes.  The wound was irrigated with normal saline solution.  I reapproximated the incision through the iliotibial band with #1 Vicryl. The remainder of the wound was closed with 2-0 Vicryl.  The skin edges were reapproximated enough at this point, I just cleaned, dried and dressed it with surgical glue and an Aquacel dressing.  She was then woken from her anesthetic and brought to the recovery room in stable condition tolerating the procedure well.  Based on her age and  osteoporotic nature of bone, she will be partial weightbearing to allow for bone healing.  We will see her back in routine followup in 2 weeks initially and then depending on how she progresses in 2-4-week interval.     Madlyn FrankelMatthew D. Charlann Boxerlin, M.D.     MDO/MEDQ  D:  09/24/2016  T:  09/24/2016  Job:  [id number]

## 2016-09-24 NOTE — Transfer of Care (Signed)
Immediate Anesthesia Transfer of Care Note  Patient: Tonya Payne  Procedure(s) Performed: Procedure(s): CANNULATED HIP PINNING LEFT HIP (Left)  Patient Location: PACU  Anesthesia Type:Spinal  Level of Consciousness: awake and alert   Airway & Oxygen Therapy: Patient Spontanous Breathing and Patient connected to face mask oxygen  Post-op Assessment: Report given to RN and Post -op Vital signs reviewed and stable  Post vital signs: Reviewed and stable  Last Vitals:  Vitals:   09/24/16 1029 09/24/16 1425  BP: 130/68 118/68  Pulse: (!) 104 94  Resp: 20 (!) 22  Temp: 36.6 C 36.8 C    Last Pain:  Vitals:   09/24/16 1700  TempSrc:   PainSc: Asleep      Patients Stated Pain Goal: 3 (09/24/16 1642)  Complications: No apparent anesthesia complications

## 2016-09-24 NOTE — Clinical Social Work Note (Signed)
Clinical Social Work Assessment  Patient Details  Name: Tonya Payne MRN: [id number] Date of Birth: 03-23-1935  Date of referral:  09/24/16               Reason for consult:  Facility Placement                Permission sought to share information with:  Facility Industrial/product designerContact Representative Permission granted to share information::  Yes, Verbal Permission Granted  Name::        Agency::     Relationship::     Contact Information:     Housing/Transportation Living arrangements for the past 2 months:  Apartment Source of Information:  Patient, Adult Children Patient Interpreter Needed:  None Criminal Activity/Legal Involvement Pertinent to Current Situation/Hospitalization:  No - Comment as needed Significant Relationships:  Adult Children (Son Tonya Payne [phone]) Lives with:  Self Do you feel safe going back to the place where you live?  Yes Need for family participation in patient care:  Yes (Comment)  Care giving concerns:  Patient recently had a fall and fractured her hip. Patient resides alone and is having surgery.    Social Worker assessment / plan:  CSW spoke with patient/patient's son Tonya Slim(Timothy Haagmann) at bedside. Patient reported that she recently fell and that she resides at home alone. Patient's son reported that patient has been [facility] in the past after having a heart attack. Patient's son reported that if PT recommends SNF that they [facility], [facility] or [facility] due to proximity and familiarity. Patient reported that she had an okay experience at [facility] but she didn't enjoy the food. Patient reports that she is agreeable to return or going to new SNF for ST rehab if recommended by PT. CSW will continue to follow and assist with discharge planning according to PT recommendation.   Employment status:    Insurance information:  Medicare PT Recommendations:  Not assessed at this time Information / Referral to community resources:   Skilled Nursing Facility  Patient/Family's Response to care:  Patient/patient's son agreeable to SNF for ST rehab if recommended by PT.   Patient/Family's Understanding of and Emotional Response to Diagnosis, Current Treatment, and Prognosis:  Patient/patient's son verbalized understanding of patient's diagnosis and current treatment. Patient reported that her care in the hospital has been fine so far and that she is feeling a little better. Patient presented calm throughout the duration of the assessment. Patient's son presented pleasant and involved in patient's care, noting that he some experience with SNF placement from patient's stay last year.   Emotional Assessment Appearance:  Appears stated age Attitude/Demeanor/Rapport:  Other (Cooperative) Affect (typically observed):  Calm Orientation:  Oriented to Self, Oriented to Place, Oriented to  Time, Oriented to Situation Alcohol / Substance use:  Not Applicable Psych involvement (Current and /or in the community):  No (Comment)  Discharge Needs  Concerns to be addressed:  Care Coordination Readmission within the last 30 days:  No Current discharge risk:  None Barriers to Discharge:  Continued Medical Work up   USG CorporationKimberly L Zelta Enfield, LCSW 09/24/2016, 12:14 PM

## 2016-09-24 NOTE — Anesthesia Postprocedure Evaluation (Signed)
Anesthesia Post Note  Patient: [name]  Procedure(s) Performed: Procedure(s) (LRB): CANNULATED HIP PINNING LEFT HIP (Left)  Patient location during evaluation: PACU Anesthesia Type: Spinal Level of consciousness: oriented and awake and alert Pain management: pain level controlled Vital Signs Assessment: post-procedure vital signs reviewed and stable Respiratory status: spontaneous breathing, respiratory function stable and patient connected to nasal cannula oxygen Cardiovascular status: blood pressure returned to baseline and stable Postop Assessment: no headache, no backache, spinal receding and patient able to bend at knees Anesthetic complications: no       Last Vitals:  Vitals:   09/24/16 2000 09/24/16 2015  BP: 115/69 121/65  Pulse: 91 91  Resp: 14 13  Temp:      Last Pain:  Vitals:   09/24/16 1700  TempSrc:   PainSc: Asleep                 Shelton SilvasKevin D Wang [place]

## 2016-09-25 ENCOUNTER — Encounter (HOSPITAL_COMMUNITY): Payer: Self-pay | Admitting: Orthopedic Surgery

## 2016-09-25 DIAGNOSIS — I714 Abdominal aortic aneurysm, without rupture: Secondary | ICD-10-CM

## 2016-09-25 DIAGNOSIS — E871 Hypo-osmolality and hyponatremia: Secondary | ICD-10-CM

## 2016-09-25 DIAGNOSIS — I10 Essential (primary) hypertension: Secondary | ICD-10-CM

## 2016-09-25 DIAGNOSIS — S72002A Fracture of unspecified part of neck of left femur, initial encounter for closed fracture: Secondary | ICD-10-CM

## 2016-09-25 DIAGNOSIS — R52 Pain, unspecified: Secondary | ICD-10-CM

## 2016-09-25 DIAGNOSIS — Z951 Presence of aortocoronary bypass graft: Secondary | ICD-10-CM

## 2016-09-25 DIAGNOSIS — N183 Chronic kidney disease, stage 3 (moderate): Secondary | ICD-10-CM

## 2016-09-25 DIAGNOSIS — I25119 Atherosclerotic heart disease of native coronary artery with unspecified angina pectoris: Secondary | ICD-10-CM

## 2016-09-25 LAB — CBC
HCT: 30.1 % — ABNORMAL LOW (ref 36.0–46.0)
Hemoglobin: 10 g/dL — ABNORMAL LOW (ref 12.0–15.0)
MCH: 32.4 pg (ref 26.0–34.0)
MCHC: 33.2 g/dL (ref 30.0–36.0)
MCV: 97.4 fL (ref 78.0–100.0)
PLATELETS: 199 10*3/uL (ref 150–400)
RBC: 3.09 MIL/uL — ABNORMAL LOW (ref 3.87–5.11)
RDW: 14.3 % (ref 11.5–15.5)
WBC: 11 10*3/uL — ABNORMAL HIGH (ref 4.0–10.5)

## 2016-09-25 LAB — BASIC METABOLIC PANEL
Anion gap: 11 (ref 5–15)
BUN: 23 mg/dL — ABNORMAL HIGH (ref 6–20)
CALCIUM: 8.6 mg/dL — AB (ref 8.9–10.3)
CO2: 20 mmol/L — ABNORMAL LOW (ref 22–32)
CREATININE: 1.46 mg/dL — AB (ref 0.44–1.00)
Chloride: 102 mmol/L (ref 101–111)
GFR calc non Af Amer: 33 mL/min — ABNORMAL LOW (ref >60)
GFR, EST AFRICAN AMERICAN: 38 mL/min — AB (ref >60)
GLUCOSE: 99 mg/dL (ref 65–99)
Potassium: 4.8 mmol/L (ref 3.5–5.1)
Sodium: 133 mmol/L — ABNORMAL LOW (ref 135–145)

## 2016-09-25 MED ORDER — ENSURE ENLIVE PO LIQD
237.0000 mL | Freq: Two times a day (BID) | ORAL | Status: DC
  Administered 2016-09-25 – 2016-09-26 (×2): 237 mL via ORAL

## 2016-09-25 NOTE — Progress Notes (Signed)
PROGRESS NOTE    Tonya Payne  YQI:[id number] DOB: February 11, 1935 DOA: 09/23/2016 PCP: Rocky Morel, MD   Brief Narrative:  The Patient is an 82 year old female with history of coronary artery disease status post CABG in June 2017, postop transient A. fib, AAA, COPD, was brought to the emergency room after having a mechanical fall and was found to have a hip fracture.  Orthopedic surgery was consulted and took the patient to surgery last night and she is s/p Cannulated Hip Pinning POD 1. PT was consulted and recommend SNF.   Assessment & Plan:   Active Problems:   Coronary artery disease involving native coronary artery with angina pectoris (HCC)   S/P CABG x 3   AAA (abdominal aortic aneurysm) without rupture (HCC)   Hip fracture (HCC)   Hyponatremia   Hypertension   Left displaced femoral neck fracture (HCC)   CKD (chronic kidney disease), stage III  Left hip fracture in the setting of mechanical fall s/p Cannulated Left Hip Pinning POD1 -Patient was at moderate risk for the procedure.   -She has a history of heart disease status post CABG, this appears stable.   -She denied any chest pains or any other symptoms with activities at home, denies any lower extremity swelling and otherwise appeared stable for surgery. -Patient was taken to Surgery last night for her Nondisplaced Left Subcapital Femoral Neck Fracture and is POD1 of Closed reduction and percutaneous cannulated screw fixation of the left hip fracture using Biomet 8.0-mm screws with 16-mm threads -Ortho followed and appreciated Recc's -Recommended ASA 81 mg po BID x 4 weeks -Norco and Robaxin written by Ortho -Bowel Regimen with Miralax and Colace for Constipation -Per Ortho Partial Weight Bearing 50% on the Left Leg, Dressing to remain in place until Clinic Follow up in 2 weeks -Follow up with Dr. Charlann Boxer in [facility] in 2 weeks -PT/OT Recommend SNF; Social Worker Consulted for placement   Acute Blood Loss  Anemia -Likely post operative Drop -Hb/Hct went from 12.9/37.9 -> 10.0/30.1 -Repeat CBC in AM -Patient started on Ferrous Sulfate 325 mg po TID by Orthopedics -Continue to Monitor S/Sx of Bleeding  Coronary artery disease status post CABG -Her chest pain equivalent was left arm and left hand pain which were present prior to her MI and CABG, she has not had any of these since her surgery.   -She is able to do her ADLs using a walker without any exertional dyspnea or arm pain. -C/w ASA 325 mg po BID, Atorvastatin 40 mg po Daily and Metoprolol 12.5 mg po BID  Hypertension -Continue home medications of Metoprolol 12.5 mg po BID  Sinus tachycardia, improving -Continue Metoprolol, likely due to pain.   -Monitor on telemetry for recurrence of A. fib, if this is happening again may need anticoagulation  History of Atrial Fibrillation -This apparently happened in the setting of her CABG, probably transient, not on anticoagulation currently -C/w Metoprolol 12.5 mg po BID  AAA -Increased in size from 4.7 -> 4.9 since last exam -Aortic Aneurysm was 4.9 cm. Recommend followup by abdomen and pelvis CTA in 6 months, and vascular surgery referral. -Outpatient management and surveillance.   Hyponatremia -Na+ was 133 -Improved with IVF with NS at 75 mL/hr -Repeat CMP in AM  Chronic kidney disease stage III -Creatinine appears close to baseline -BUN/Cr went from 16/1.25 -> 23/1.46 -Repeat CMP in AM  Leukocytosis -Likely Reactive; WBC improved from 13.0 -> 11.0 -Continue to Monitor for S/Sx of Infection -Repeat CBC in AM  DVT prophylaxis: SCDs and ASA 325 mg po BID Code Status: DO NOT RESUSCITATE Family Communication: No Family Present at bedside Disposition Plan: SNF likely in AM  Consultants:   Orthopedics Dr. Durene Romans   Procedures: Nondisplaced Left Subcapital Femoral Neck Fx s/p Closed Reduction and Percutaneous Cannulated Screw Fixation of Left  Hip   Antimicrobials:  Anti-infectives    Start     Dose/Rate Route Frequency Ordered Stop   09/25/16 0030  ceFAZolin (ANCEF) IVPB 1 g/50 mL premix     1 g 100 mL/hr over 30 Minutes Intravenous Every 6 hours 09/24/16 2120 09/25/16 0634   09/24/16 0930  ceFAZolin (ANCEF) IVPB 2g/100 mL premix     2 g 200 mL/hr over 30 Minutes Intravenous On call to O.R. 09/24/16 0925 09/24/16 1838     Subjective: Seen and examined and was in some pain. No nausea or vomiting. Had no other complaints and felt weak. States AAA is not new.   Objective: Vitals:   09/25/16 0157 09/25/16 0502 09/25/16 1000 09/25/16 1154  BP: 115/73 (!) 98/58  (!) 113/54  Pulse: 93 (!) 103  100  Resp: 16 18  18   Temp: 97.9 F (36.6 C) 98.7 F (37.1 C)  98.4 F (36.9 C)  TempSrc: Oral Oral  Oral  SpO2: 98% 98% 93% 91%  Weight:      Height:        Intake/Output Summary (Last 24 hours) at 09/25/16 1702 Last data filed at 09/25/16 1400  Gross per 24 hour  Intake             3235 ml  Output              700 ml  Net             2535 ml   Filed Weights   09/23/16 1927  Weight: 51.3 kg (113 lb)   Examination: Physical Exam:  Constitutional: NAD and appears calm and comfortable Eyes: Lids and conjunctivae normal, sclerae anicteric  ENMT: External Ears, Nose appear normal. Grossly normal hearing. Mucous membranes are moist.  Neck: Appears normal, supple, no cervical masses, normal ROM, no appreciable thyromegaly, no JVD Respiratory: Clear to auscultation bilaterally, no wheezing, rales, rhonchi or crackles. Normal respiratory effort and patient is not tachypenic. No accessory muscle use.  Cardiovascular: Slightly tachycardic regular rhythm, no murmurs / rubs / gallops. S1 and S2 auscultated. No extremity edema. 2+ pedal pulses.   Abdomen: Soft, mildly-tender, non-distended. Abdominal Aortic Aneurysm palpable. No appreciable hepatosplenomegaly. Bowel sounds positive x4.  GU: Deferred. Musculoskeletal: No clubbing /  cyanosis of digits/nails. No joint deformity upper and lower extremities.  Skin: No rashes, lesions, ulcers on limited exam. No induration; Warm and dry. Left Skin Incision C/D/I Neurologic: CN 2-12 grossly intact with no focal deficits. Romberg sign cerebellar reflexes not assessed.  Psychiatric: Normal judgment and insight. Alert and oriented x 3. Normal mood and appropriate affect.   Data Reviewed: I have personally reviewed following labs and imaging studies  CBC:  Recent Labs Lab 09/23/16 1810 09/24/16 0002 09/25/16 0559  WBC 9.4 13.0* 11.0*  NEUTROABS 7.2  --   --   HGB 12.7 12.9 10.0*  HCT 36.6 37.9 30.1*  MCV 93.1 94.3 97.4  PLT 244 259 199   Basic Metabolic Panel:  Recent Labs Lab 09/23/16 1810 09/24/16 0002 09/25/16 0559  NA 129* 133* 133*  K 4.3 4.0 4.8  CL 95* 100* 102  CO2 22 23 20*  GLUCOSE 104* 129*  99  BUN 19 16 23*  CREATININE 1.47* 1.25* 1.46*  CALCIUM 9.4 9.0 8.6*   GFR: Estimated Creatinine Clearance: 23.2 mL/min (A) (by C-G formula based on SCr of 1.46 mg/dL (H)). Liver Function Tests:  Recent Labs Lab 09/23/16 1810 09/24/16 0002  AST 31 31  ALT 21 19  ALKPHOS 120 109  BILITOT 0.8 0.8  PROT 7.6 6.9  ALBUMIN 4.2 3.9   No results for input(s): LIPASE, AMYLASE in the last 168 hours. No results for input(s): AMMONIA in the last 168 hours. Coagulation Profile: No results for input(s): INR, PROTIME in the last 168 hours. Cardiac Enzymes: No results for input(s): CKTOTAL, CKMB, CKMBINDEX, TROPONINI in the last 168 hours. BNP (last 3 results) No results for input(s): PROBNP in the last 8760 hours. HbA1C: No results for input(s): HGBA1C in the last 72 hours. CBG: No results for input(s): GLUCAP in the last 168 hours. Lipid Profile: No results for input(s): CHOL, HDL, LDLCALC, TRIG, CHOLHDL, LDLDIRECT in the last 72 hours. Thyroid Function Tests: No results for input(s): TSH, T4TOTAL, FREET4, T3FREE, THYROIDAB in the last 72 hours. Anemia  Panel: No results for input(s): VITAMINB12, FOLATE, FERRITIN, TIBC, IRON, RETICCTPCT in the last 72 hours. Sepsis Labs: No results for input(s): PROCALCITON, LATICACIDVEN in the last 168 hours.  Recent Results (from the past 240 hour(s))  Surgical PCR screen     Status: None   Collection Time: 09/24/16 12:01 AM  Result Value Ref Range Status   MRSA, PCR NEGATIVE NEGATIVE Final   Staphylococcus aureus NEGATIVE NEGATIVE Final    Comment:        The Xpert SA Assay (FDA approved for NASAL specimens in patients over 8 years of age), is one component of a comprehensive surveillance program.  Test performance has been validated by [facility] for patients greater than or equal to 42 year old. It is not intended to diagnose infection nor to guide or monitor treatment.     Radiology Studies: Dg Chest 2 View  Result Date: 09/23/2016 CLINICAL DATA:  Fall EXAM: CHEST  2 VIEW COMPARISON:  06/05/2016 FINDINGS: Lower cervical hardware. Post sternotomy changes of the mediastinum. Lungs appear hyperinflated. There is no focal consolidation or effusion. Mild fibrosis at the lung bases. No pneumothorax. Old appearing right eighth and tenth rib fractures. IMPRESSION: 1. Bibasilar fibrosis. No acute infiltrate, pleural effusion or pneumothorax. 2. Old appearing right rib fractures Electronically Signed   By: Jasmine Pang M.D.   On: 09/23/2016 18:04   Dg Pelvis 1-2 Views  Result Date: 09/23/2016 CLINICAL DATA:  Fall with pain in both hips EXAM: PELVIS - 1-2 VIEW COMPARISON:  01/26/2016 FINDINGS: SI joints symmetric. Postsurgical changes in the pelvis. Calcified pelvic phleboliths. Pubic symphysis appears intact. No dislocation. Old right superior pubic ramus fracture. Possible step-off deformity at the left subcapital femur. IMPRESSION: 1. Possible step-off deformity within the subcapital left femur, recommend CT for further evaluation. 2. Old appearing right superior pubic ramus fracture Electronically  Signed   By: Jasmine Pang M.D.   On: 09/23/2016 18:06   Dg Tibia/fibula Left  Result Date: 09/23/2016 CLINICAL DATA:  Fall with laceration EXAM: LEFT TIBIA AND FIBULA - 2 VIEW COMPARISON:  None. FINDINGS: Vascular calcifications. No acute displaced fracture or malalignment. No radiopaque foreign body. IMPRESSION: No acute osseous abnormality Electronically Signed   By: Jasmine Pang M.D.   On: 09/23/2016 18:07   Ct Head Wo Contrast  Result Date: 09/23/2016 CLINICAL DATA:  Recent trip and fall with  pain, initial encounter EXAM: CT HEAD WITHOUT CONTRAST CT CERVICAL SPINE WITHOUT CONTRAST TECHNIQUE: Multidetector CT imaging of the head and cervical spine was performed following the standard protocol without intravenous contrast. Multiplanar CT image reconstructions of the cervical spine were also generated. COMPARISON:  06/05/2016 FINDINGS: CT HEAD FINDINGS Brain: Atrophic changes and chronic white matter ischemic changes are seen. No findings to suggest acute hemorrhage, acute infarction or space-occupying mass lesion are seen. Changes of prior right occipital infarct are noted. Prior lacunar infarct is also noted in the region of the basal ganglia on the left stable from the prior exam. Vascular: No hyperdense vessel or unexpected calcification. Skull: Normal. Negative for fracture or focal lesion. Sinuses/Orbits: No acute finding. Other: None. CT CERVICAL SPINE FINDINGS Alignment: Mild anterolisthesis of C7 on T1 stable from the prior exam. Skull base and vertebrae: 7 cervical segments are well visualized. Changes of prior fusion are noted at C5-6 and C6-7. Facet hypertrophic changes are noted. No acute fracture or acute facet abnormality is noted. Soft tissues and spinal canal: No acute soft tissue abnormality is noted. Diffuse vascular calcifications are seen. Upper chest: Stable scarring in the right upper lobe is noted. Mild emphysematous changes are seen. IMPRESSION: CT of the head: Chronic changes  without acute abnormality. CT of the cervical spine: Postsurgical and degenerative changes stable from the prior exam. Electronically Signed   By: Alcide CleverMark  Lukens M.D.   On: 09/23/2016 18:59   Ct Cervical Spine Wo Contrast  Result Date: 09/23/2016 CLINICAL DATA:  Recent trip and fall with pain, initial encounter EXAM: CT HEAD WITHOUT CONTRAST CT CERVICAL SPINE WITHOUT CONTRAST TECHNIQUE: Multidetector CT imaging of the head and cervical spine was performed following the standard protocol without intravenous contrast. Multiplanar CT image reconstructions of the cervical spine were also generated. COMPARISON:  06/05/2016 FINDINGS: CT HEAD FINDINGS Brain: Atrophic changes and chronic white matter ischemic changes are seen. No findings to suggest acute hemorrhage, acute infarction or space-occupying mass lesion are seen. Changes of prior right occipital infarct are noted. Prior lacunar infarct is also noted in the region of the basal ganglia on the left stable from the prior exam. Vascular: No hyperdense vessel or unexpected calcification. Skull: Normal. Negative for fracture or focal lesion. Sinuses/Orbits: No acute finding. Other: None. CT CERVICAL SPINE FINDINGS Alignment: Mild anterolisthesis of C7 on T1 stable from the prior exam. Skull base and vertebrae: 7 cervical segments are well visualized. Changes of prior fusion are noted at C5-6 and C6-7. Facet hypertrophic changes are noted. No acute fracture or acute facet abnormality is noted. Soft tissues and spinal canal: No acute soft tissue abnormality is noted. Diffuse vascular calcifications are seen. Upper chest: Stable scarring in the right upper lobe is noted. Mild emphysematous changes are seen. IMPRESSION: CT of the head: Chronic changes without acute abnormality. CT of the cervical spine: Postsurgical and degenerative changes stable from the prior exam. Electronically Signed   By: Alcide CleverMark  Lukens M.D.   On: 09/23/2016 18:59   Ct Pelvis Wo Contrast  Result  Date: 09/23/2016 CLINICAL DATA:  Evaluate for hip fracture. EXAM: CT PELVIS WITHOUT CONTRAST TECHNIQUE: Multidetector CT imaging of the pelvis was performed following the standard protocol without intravenous contrast. COMPARISON:  01/26/2016. FINDINGS: Urinary Tract:  No abnormality visualized. Bowel:  Unremarkable visualized pelvic bowel loops. Vascular/Lymphatic: Aortic atherosclerosis. Infrarenal abdominal aortic aneurysm measures 4.9 cm, image number 1 of series 4. This is increased from 4.7 cm previously. No adenopathy identified. Reproductive:  No mass or other  significant abnormality Other:  None. Musculoskeletal: The bones appear diffusely osteopenic. There is an acute slightly impacted subcapital left femoral neck fracture, image 51 of series 6. No dislocation. IMPRESSION: 1. Acute slightly impacted subcapital femoral neck fracture involves the left hip. 2. Aortic aneurysm NOS (ICD10-I71.9). This measures 4.9 cm. Recommend followup by abdomen and pelvis CTA in 6 months, and vascular surgery referral/consultation if not already obtained. This recommendation follows ACR consensus guidelines: White Paper of the ACR Incidental Findings Committee II on Vascular Findings. J Am Coll Radiol 2013; 16:109-604. 3.  Aortic Atherosclerosis (ICD10-I70.0). 4. These results will be called to the ordering clinician or representative by the Radiologist Assistant, and communication documented in the PACS or zVision Dashboard. Electronically Signed   By: Signa Kell M.D.   On: 09/23/2016 19:29   Dg Knee Complete 4 Views Right  Result Date: 09/23/2016 CLINICAL DATA:  Fall with pain to the anterior knee EXAM: RIGHT KNEE - COMPLETE 4+ VIEW COMPARISON:  09/21/2015 FINDINGS: Status post wire fixation of patella with fracture through the left upper limb as before. No acute displaced fracture or malalignment. Mild medial and lateral degenerative changes. No large effusion. Vascular calcifications. IMPRESSION: Postsurgical  changes of the patella. No definite acute osseous abnormality. Electronically Signed   By: Jasmine Pang M.D.   On: 09/23/2016 18:10   Dg Abd Portable 1v  Result Date: 09/24/2016 CLINICAL DATA:  Abdominal pain.  Left hip fracture. EXAM: PORTABLE ABDOMEN - 1 VIEW COMPARISON:  CT scan of the pelvis dated 09/23/2016 FINDINGS: The bowel gas pattern is normal. There is a fusiform abdominal aortic aneurysm which measures 6.5 cm, uncorrected for magnification. No visible free air or free fluid in the abdomen. Left proximal femoral fracture is noted. Chronic interstitial disease is noted at both lung bases. IMPRESSION: 6.5 cm abdominal aortic aneurysm. Left hip fracture. Otherwise benign appearing abdomen and pelvis. Electronically Signed   By: Francene Boyers M.D.   On: 09/24/2016 09:27   Dg C-arm 1-60 Min-no Report  Result Date: 09/24/2016 Fluoroscopy was utilized by the requesting physician.  No radiographic interpretation.   Dg Hip Operative Unilat With Pelvis Left  Result Date: 09/24/2016 CLINICAL DATA:  82 year old female with left femur fracture. Subsequent encounter. EXAM: OPERATIVE left HIP (WITH PELVIS IF PERFORMED) 2 VIEWS TECHNIQUE: Fluoroscopic spot image(s) were submitted for interpretation post-operatively. COMPARISON:  09/23/2016 FINDINGS: Three screws utilized to transfix left femoral neck fracture without complication noted. Screws are contain within the femoral head. IMPRESSION: Three screws utilized to transfix left femoral neck fracture without complication noted. Electronically Signed   By: Lacy Duverney M.D.   On: 09/24/2016 20:39   Scheduled Meds: . aspirin EC  325 mg Oral BID  . atorvastatin  40 mg Oral q1800  . feeding supplement (ENSURE ENLIVE)  237 mL Oral BID BM  . ferrous sulfate  325 mg Oral TID PC  . loratadine  10 mg Oral Daily  . metoprolol tartrate  12.5 mg Oral BID  . OLANZapine  5 mg Oral QHS  . sertraline  50 mg Oral q morning - 10a   Continuous Infusions: .  sodium chloride 75 mL/hr at 09/25/16 1142  . methocarbamol (ROBAXIN)  IV      LOS: 2 days   Merlene Laughter, DO [facility] Pager [phone]  If 7PM-7AM, please contact night-coverage [email or web address] Password [email or web address] 09/25/2016, 5:02 PM

## 2016-09-25 NOTE — NC FL2 (Signed)
[place] MEDICAID FL2 LEVEL OF CARE SCREENING TOOL     IDENTIFICATION  Patient Name: Tonya Payne Birthdate: 1935/02/07 Sex: female Admission Date (Current Location): 09/23/2016  [facility] and [place] Number:  Producer, television/film/video and Address:  [facility],  [address] [place], [place] [address]      Provider Number: [id number]  Attending Physician Name and Address:  Merlene Laughter, DO  Relative Name and Phone Number:       Current Level of Care: Hospital Recommended Level of Care: Skilled Nursing Facility Prior Approval Number:    Date Approved/Denied:   PASRR Number: [id number]  Discharge Plan: SNF    Current Diagnoses: Patient Active Problem List   Diagnosis Date Noted  . CKD (chronic kidney disease), stage III 09/24/2016  . Hip fracture (HCC) 09/23/2016  . Hyponatremia 09/23/2016  . Hypertension 09/23/2016  . Left displaced femoral neck fracture (HCC)   . Closed fracture of multiple pubic rami, right, sequela 02/27/2016  . Displaced fracture of neck of right second metacarpal bone with routine healing 02/27/2016  . Protein-calorie malnutrition, severe (HCC) 02/22/2016  . Orthostatic dizziness 02/22/2016  . Postoperative atrial fibrillation (HCC) 02/22/2016  . Fall   . Hand fracture   . Pelvic fracture (HCC) 01/26/2016  . AAA (abdominal aortic aneurysm) without rupture (HCC) 11/30/2015  . Post PTCA   . S/P CABG x 3 10/16/2015  . Coronary artery disease involving native coronary artery with angina pectoris (HCC) 10/08/2015  . Congestive dilated cardiomyopathy (HCC) 10/08/2015  . Hyperlipidemia LDL goal <70 10/08/2015  . UTI (urinary tract infection) 10/06/2015  . Myocardial infarction involving left anterior descending (LAD) coronary artery (HCC) 10/06/2015    Orientation RESPIRATION BLADDER Height & Weight     Self, Time, Situation, Place  O2 Indwelling catheter Weight: 113 lb (51.3 kg) Height:  5\' 1"  (154.9 cm)  BEHAVIORAL SYMPTOMS/MOOD  NEUROLOGICAL BOWEL NUTRITION STATUS        Diet  AMBULATORY STATUS COMMUNICATION OF NEEDS Skin   Total Care Verbally Surgical wounds (Incision(Closed)05/22/18HipLeft )                       Personal Care Assistance Level of Assistance  Bathing, Feeding, Dressing Bathing Assistance: Maximum assistance Feeding assistance: Independent Dressing Assistance: Maximum assistance     Functional Limitations Info             SPECIAL CARE FACTORS FREQUENCY  PT (By licensed PT), OT (By licensed OT)     PT Frequency: 5x/week OT Frequency: 5x/week            Contractures Contractures Info: Not present    Additional Factors Info  Code Status, Allergies Code Status Info: DNR Allergies Info: NKA           Current Medications (09/25/2016):  This is the current hospital active medication list Current Facility-Administered Medications  Medication Dose Route Frequency Provider Last Rate Last Dose  . 0.9 %  sodium chloride infusion   Intravenous Continuous Leatha Gilding, MD 75 mL/hr at 09/25/16 1142    . aspirin EC tablet 325 mg  325 mg Oral BID Lanney Gins, PA-C   325 mg at 09/25/16 0931  . atorvastatin (LIPITOR) tablet 40 mg  40 mg Oral q1800 Eduard Clos, MD      . feeding supplement (ENSURE ENLIVE) (ENSURE ENLIVE) liquid 237 mL  237 mL Oral BID BM Sheikh, Omair Latif, DO   237 mL at 09/25/16 1142  . ferrous  sulfate tablet 325 mg  325 mg Oral TID PC Lanney Gins, PA-C   325 mg at 09/25/16 0932  . hydrALAZINE (APRESOLINE) injection 10 mg  10 mg Intravenous Q4H PRN Eduard Clos, MD      . HYDROcodone-acetaminophen (NORCO/VICODIN) 5-325 MG per tablet 1-2 tablet  1-2 tablet Oral Q6H PRN Lanney Gins, PA-C   2 tablet at 09/25/16 1142  . loratadine (CLARITIN) tablet 10 mg  10 mg Oral Daily Eduard Clos, MD   10 mg at 09/25/16 0932  . menthol-cetylpyridinium (CEPACOL) lozenge 3 mg  1 lozenge Oral PRN Lanney Gins, PA-C       Or  . phenol  (CHLORASEPTIC) mouth spray 1 spray  1 spray Mouth/Throat PRN Babish, Matthew, PA-C      . methocarbamol (ROBAXIN) tablet 500 mg  500 mg Oral Q6H PRN Lanney Gins, PA-C       Or  . methocarbamol (ROBAXIN) 500 mg in dextrose 5 % 50 mL IVPB  500 mg Intravenous Q6H PRN Babish, Matthew, PA-C      . metoCLOPramide (REGLAN) tablet 5-10 mg  5-10 mg Oral Q8H PRN Lanney Gins, PA-C       Or  . metoCLOPramide (REGLAN) injection 5-10 mg  5-10 mg Intravenous Q8H PRN Lanney Gins, PA-C   10 mg at 09/24/16 2143  . metoprolol tartrate (LOPRESSOR) tablet 12.5 mg  12.5 mg Oral BID Eduard Clos, MD   12.5 mg at 09/25/16 0932  . morphine 4 MG/ML injection 0.52 mg  0.52 mg Intravenous Q2H PRN Eduard Clos, MD   0.52 mg at 09/25/16 0205  . OLANZapine (ZYPREXA) tablet 5 mg  5 mg Oral QHS Eduard Clos, MD   5 mg at 09/24/16 2220  . sertraline (ZOLOFT) tablet 50 mg  50 mg Oral q morning - 10a Eduard Clos, MD   50 mg at 09/25/16 0932   Facility-Administered Medications Ordered in Other Encounters  Medication Dose Route Frequency Provider Last Rate Last Dose  . bivalirudin (ANGIOMAX) 250 mg in sodium chloride 0.9 % 50 mL (5 mg/mL) infusion  250 mg  Continuous PRN Marykay Lex, MD   Stopped at 10/06/15 1556  . bivalirudin (ANGIOMAX) BOLUS via infusion    PRN Marykay Lex, MD   27.225 mg at 10/06/15 1504  . fentaNYL (SUBLIMAZE) injection    PRN Marykay Lex, MD   25 mcg at 10/06/15 1529  . heparin infusion 2 units/mL in 0.9 % sodium chloride    Continuous PRN Marykay Lex, MD   1,500 mL at 10/06/15 1454  . iopamidol (ISOVUE-370) 76 % injection    PRN Marykay Lex, MD   150 mL at 10/06/15 1554  . labetalol (NORMODYNE,TRANDATE) injection    PRN Marykay Lex, MD   10 mg at 10/06/15 1526  . lidocaine (PF) (XYLOCAINE) 1 % injection    PRN Marykay Lex, MD   2 mL at 10/06/15 1454  . nitroGLYCERIN 1 mg/10 ml (100 mcg/ml) - IR/CATH LAB    PRN Marykay Lex, MD    200 mcg at 10/06/15 1525  . ondansetron (ZOFRAN) injection    PRN Marykay Lex, MD   4 mg at 10/06/15 1453  . Radial Cocktail (Verapamil 2.5 mg, NTG, Lidocaine)    PRN Marykay Lex, MD      . ticagrelor [facility]) tablet    PRN Marykay Lex, MD   180 mg at 10/06/15 1506  .  tirofiban (AGGRASTAT) bolus via infusion    PRN Marykay LexHarding, David W, MD   907.5 mcg at 10/06/15 1558  . tirofiban (AGGRASTAT) infusion 50 mcg/mL 100 mL    Continuous PRN Marykay LexHarding, David W, MD 3.3 mL/hr at 10/06/15 1606 0.075 mcg/kg/min at 10/06/15 1606     Discharge Medications: Please see discharge summary for a list of discharge medications.  Relevant Imaging Results:  Relevant Lab Results:   Additional Information SSN [id number]  Antionette PolesKimberly L Daryll Spisak, LCSW

## 2016-09-25 NOTE — Progress Notes (Signed)
     Subjective: 1 Day Post-Op Procedure(s) (LRB): CANNULATED HIP PINNING LEFT HIP (Left)   Patient reports pain as mild, from the hip.  No reported events throughout the night.   Objective:   VITALS:   Vitals:   09/25/16 0157 09/25/16 0502  BP: 115/73 (!) 98/58  Pulse: 93 (!) 103  Resp: 16 18  Temp: 97.9 F (36.6 C) 98.7 F (37.1 C)    Dorsiflexion/Plantar flexion intact Incision: dressing C/D/I No cellulitis present Compartment soft  LABS  Recent Labs  09/23/16 1810 09/24/16 0002 09/25/16 0559  HGB 12.7 12.9 10.0*  HCT 36.6 37.9 30.1*  WBC 9.4 13.0* 11.0*  PLT 244 259 199     Recent Labs  09/23/16 1810 09/24/16 0002 09/25/16 0559  NA 129* 133* 133*  K 4.3 4.0 4.8  BUN 19 16 23*  CREATININE 1.47* 1.25* 1.46*  GLUCOSE 104* 129* 99     Assessment/Plan: 1 Day Post-Op Procedure(s) (LRB): CANNULATED HIP PINNING LEFT HIP (Left) Up with therapy Discharge disposition to be determined   Ortho recommendations:  ASA 81 mg bid for 4 weeks for anticoagulation, unless other medically indicated.  Norco for pain management (Rx written).  Robaxin for muscle spasms (Rx written).  MiraLax and Colace for constipation  Iron 325 mg tid for 2-3 weeks   PWB 50% on the left leg.  Dressing to remain in place until follow in clinic in 2 weeks.  Dressing is waterproof and may shower with it in place.  [facility]. Follow up with OLIN,Eastin Swing D in 2 weeks.  Contact information:  [facility] [address], [address] [place] [id number] [phone]        Anastasio AuerbachMatthew S. Ed Mandich   PAC  09/25/2016, 9:14 AM

## 2016-09-25 NOTE — Care Management Note (Signed)
Case Management Note  Patient Details  Name: Tonya Payne MRN: [id number] Date of Birth: 07-09-1934  Subjective/Objective:PT recc SNF-CSW already following for SNF.                    Action/Plan:d/c SNF.   Expected Discharge Date:   (unknown)               Expected Discharge Plan:  Skilled Nursing Facility  In-House Referral:  Clinical Social Work  Discharge planning Services  CM Consult  Post Acute Care Choice:    Choice offered to:     DME Arranged:    DME Agency:     HH Arranged:    HH Agency:     Status of Service:  In process, will continue to follow  If discussed at Long Length of Stay Meetings, dates discussed:    Additional Comments:  Tonya Payne, Tonya Delaughter, RN 09/25/2016, 12:34 PM

## 2016-09-25 NOTE — Evaluation (Addendum)
Physical Therapy Evaluation Patient Details Name: Tonya AlmSuann Payne MRN: [id number] DOB: Sep 16, 1934 Today's Date: 09/25/2016   History of Present Illness  82 y.o. female with history of CAD status post stenting and CABG, postoperative atrial fibrillation, abdominal aortic aneurysm, COPD, fall with pelvic and R hand fractures Sept 2017 was brought to the ER after patient had a fall at her living facility.  Dx of L hip fx, s/p screw fixation 09/24/16. PWB.   Clinical Impression  Pt admitted with above diagnosis. Pt currently with functional limitations due to the deficits listed below (see PT Problem List). Pt unable to tolerate minimal movement 2* severe L hip pain. Pain meds requested. Will follow. SNF recommended.  Pt will benefit from skilled PT to increase their independence and safety with mobility to allow discharge to the venue listed below.       Follow Up Recommendations SNF;Supervision/Assistance - 24 hour    Equipment Recommendations  None recommended by PT    Recommendations for Other Services       Precautions / Restrictions Precautions Precautions: Fall Restrictions Weight Bearing Restrictions: Yes LLE Weight Bearing: Partial weight bearing LLE Partial Weight Bearing Percentage or Pounds: 50      Mobility  Bed Mobility Overal bed mobility: Needs Assistance Bed Mobility: Supine to Sit     Supine to sit: +2 for physical assistance;Total assist     General bed mobility comments: attempted supine to sit, pt unable to tolerate minimal movement of LLE 2* pain so did not achieve sitting position, she requested no further activity, pain meds requested  Transfers                    Ambulation/Gait                Stairs            Wheelchair Mobility    Modified Rankin (Stroke Patients Only)       Balance                                             Pertinent Vitals/Pain Pain Assessment: 0-10 Pain Score: 10-Worst pain  ever Pain Location: L hip Pain Descriptors / Indicators: Throbbing Pain Intervention(s): Limited activity within patient's tolerance;Monitored during session;Patient requesting pain meds-RN notified    Home Living Family/patient expects to be discharged to:: Skilled nursing facility Living Arrangements: Alone               Additional Comments: from ILF    Prior Function Level of Independence: Independent         Comments: independent with rollator, also has WC, stated she's independent with bathing/dressing     Hand Dominance   Dominant Hand: Right    Extremity/Trunk Assessment   Upper Extremity Assessment Upper Extremity Assessment: Defer to OT evaluation    Lower Extremity Assessment Lower Extremity Assessment: LLE deficits/detail LLE Deficits / Details: ankle PF/DF intact, pain with minimal movement of hip, able to flex hip with assist ~25*, pt requested no further movement 2* pain LLE: Unable to fully assess due to pain       Communication   Communication: No difficulties  Cognition Arousal/Alertness: Awake/alert Behavior During Therapy: WFL for tasks assessed/performed Overall Cognitive Status: Within Functional Limits for tasks assessed  General Comments      Exercises     Heel slides x 5 LLE AAROM supine Hip ABDuction x 5 LLE AAROM supine Ankle pumps x 10 BLE AROM supine   Assessment/Plan    PT Assessment Patient needs continued PT services  PT Problem List Decreased strength;Decreased range of motion;Decreased activity tolerance;Pain;Decreased mobility;Decreased knowledge of precautions       PT Treatment Interventions DME instruction;Gait training;Functional mobility training;Balance training;Therapeutic exercise;Therapeutic activities;Patient/family education    PT Goals (Current goals can be found in the Care Plan section)  Acute Rehab PT Goals Patient Stated Goal: decrease pain,  agreeable to SNF PT Goal Formulation: With patient Time For Goal Achievement: 10/09/16 Potential to Achieve Goals: Fair    Frequency Min 3X/week   Barriers to discharge        Co-evaluation               AM-PAC PT "6 Clicks" Daily Activity  Outcome Measure Difficulty turning over in bed (including adjusting bedclothes, sheets and blankets)?: Total Difficulty moving from lying on back to sitting on the side of the bed? : Total Difficulty sitting down on and standing up from a chair with arms (e.g., wheelchair, bedside commode, etc,.)?: Total Help needed moving to and from a bed to chair (including a wheelchair)?: Total Help needed walking in hospital room?: Total Help needed climbing 3-5 steps with a railing? : Total 6 Click Score: 6    End of Session   Activity Tolerance: Patient limited by pain Patient left: in bed;with call bell/phone within reach;with bed alarm set Nurse Communication: Mobility status;Need for lift equipment PT Visit Diagnosis: Repeated falls (R29.6);Pain Pain - Right/Left: Left Pain - part of body: Hip    Time: 1610-9604 PT Time Calculation (min) (ACUTE ONLY): 13 min   Charges:   PT Evaluation $PT Eval Moderate Complexity: 1 Procedure     PT G Codes:          Tamala Ser 09/25/2016, 11:51 AM [phone]

## 2016-09-25 NOTE — Progress Notes (Signed)
OT Cancellation Note  Patient Details Name: Tonya Payne MRN: [id number] DOB: 09-01-1934   Cancelled Treatment:    Reason Eval/Treat Not Completed: Other (comment). Noted, plan is for SNF. Will defer OT to that venue.  Dawnn Nam 09/25/2016, 1:48 PM  Marica OtterMaryellen Tejay Hubert, OTR/L [phone] 09/25/2016

## 2016-09-25 NOTE — Progress Notes (Signed)
Chaplain visited with patient for a Advanced Directive Consult.  Patient states she would like the paperwork for an Advanced Directive and that her son would be her medical POA.  Patient says her son is not here right now and she doesn't know when he will be back.  Chaplain shared with the patient that as a service to her we can get the AD completed while she is an inpatient in the hospital and that if she wants that done to please have us paged and that we are available until 430PM.  Patient was pleased with this and said she would read the information.  She said she felt already familiar with it.  Patient states she is feeling well from yesterday's surgery.    09/25/16 1005  Clinical Encounter Type  Visited With Patient  Visit Type Initial;Social support  Referral From Physician  Consult/Referral To Chaplain  Stress Factors  Patient Stress Factors Health changes  Family Stress Factors Health changes

## 2016-09-26 LAB — CBC WITH DIFFERENTIAL/PLATELET
Basophils Absolute: 0 10*3/uL (ref 0.0–0.1)
Basophils Relative: 0 %
Eosinophils Absolute: 0 10*3/uL (ref 0.0–0.7)
Eosinophils Relative: 0 %
HEMATOCRIT: 23.7 % — AB (ref 36.0–46.0)
Hemoglobin: 8.2 g/dL — ABNORMAL LOW (ref 12.0–15.0)
LYMPHS ABS: 0.7 10*3/uL (ref 0.7–4.0)
LYMPHS PCT: 7 %
MCH: 33.1 pg (ref 26.0–34.0)
MCHC: 34.6 g/dL (ref 30.0–36.0)
MCV: 95.6 fL (ref 78.0–100.0)
MONO ABS: 1.1 10*3/uL — AB (ref 0.1–1.0)
MONOS PCT: 12 %
NEUTROS ABS: 7.2 10*3/uL (ref 1.7–7.7)
Neutrophils Relative %: 81 %
PLATELETS: 168 10*3/uL (ref 150–400)
RBC: 2.48 MIL/uL — ABNORMAL LOW (ref 3.87–5.11)
RDW: 14.2 % (ref 11.5–15.5)
WBC: 9 10*3/uL (ref 4.0–10.5)

## 2016-09-26 LAB — COMPREHENSIVE METABOLIC PANEL
ALT: 10 U/L — ABNORMAL LOW (ref 14–54)
AST: 27 U/L (ref 15–41)
Albumin: 2.8 g/dL — ABNORMAL LOW (ref 3.5–5.0)
Alkaline Phosphatase: 75 U/L (ref 38–126)
Anion gap: 8 (ref 5–15)
BILIRUBIN TOTAL: 0.4 mg/dL (ref 0.3–1.2)
BUN: 21 mg/dL — AB (ref 6–20)
CHLORIDE: 102 mmol/L (ref 101–111)
CO2: 22 mmol/L (ref 22–32)
Calcium: 8.2 mg/dL — ABNORMAL LOW (ref 8.9–10.3)
Creatinine, Ser: 1.16 mg/dL — ABNORMAL HIGH (ref 0.44–1.00)
GFR, EST AFRICAN AMERICAN: 50 mL/min — AB (ref >60)
GFR, EST NON AFRICAN AMERICAN: 43 mL/min — AB (ref >60)
Glucose, Bld: 148 mg/dL — ABNORMAL HIGH (ref 65–99)
POTASSIUM: 4 mmol/L (ref 3.5–5.1)
Sodium: 132 mmol/L — ABNORMAL LOW (ref 135–145)
TOTAL PROTEIN: 5.6 g/dL — AB (ref 6.5–8.1)

## 2016-09-26 LAB — PHOSPHORUS: PHOSPHORUS: 1.9 mg/dL — AB (ref 2.5–4.6)

## 2016-09-26 LAB — MAGNESIUM: MAGNESIUM: 1.7 mg/dL (ref 1.7–2.4)

## 2016-09-26 LAB — PREPARE RBC (CROSSMATCH)

## 2016-09-26 MED ORDER — SODIUM CHLORIDE 0.9 % IV SOLN
Freq: Once | INTRAVENOUS | Status: AC
  Administered 2016-09-26: 09:00:00 via INTRAVENOUS

## 2016-09-26 MED ORDER — METHOCARBAMOL 500 MG PO TABS: 500.0000 mg | ORAL_TABLET | Freq: Four times a day (QID) | ORAL | 0 refills | Status: DC | PRN

## 2016-09-26 MED ORDER — SODIUM PHOSPHATES 45 MMOLE/15ML IV SOLN
30.0000 mmol | Freq: Once | INTRAVENOUS | Status: AC
  Administered 2016-09-26: 30 mmol via INTRAVENOUS
  Filled 2016-09-26: qty 10

## 2016-09-26 MED ORDER — ENSURE ENLIVE PO LIQD: 237.0000 mL | Freq: Two times a day (BID) | ORAL | 12 refills | Status: DC

## 2016-09-26 NOTE — Progress Notes (Signed)
Pt left facility @ 2150 to return to [facility]. Pt stable  A&O X 1.

## 2016-09-26 NOTE — Clinical Social Work Placement (Signed)
   CLINICAL SOCIAL WORK PLACEMENT  NOTE  Date:  09/26/2016  Patient Details  Name: Tonya Payne MRN: [id number] Date of Birth: March 02, 1935  Clinical Social Work is seeking post-discharge placement for this patient at the Skilled  Nursing Facility level of care (*CSW will initial, date and re-position this form in  chart as items are completed):  Yes   Patient/family provided with [facility] Clinical Social Work Department's list of facilities offering this level of care within the geographic area requested by the patient (or if unable, by the patient's family).  Yes   Patient/family informed of their freedom to choose among providers that offer the needed level of care, that participate in Medicare, Medicaid or managed care program needed by the patient, have an available bed and are willing to accept the patient.  Yes   Patient/family informed of [facility]'s ownership interest in [facility] and [facility], as well as of the fact that they are under no obligation to receive care at these facilities.  PASRR submitted to EDS on 09/26/16     PASRR number received on 09/26/16     Existing PASRR number confirmed on       FL2 transmitted to all facilities in geographic area requested by pt/family on 09/26/16     FL2 transmitted to all facilities within larger geographic area on       Patient informed that his/her managed care company has contracts with or will negotiate with certain facilities, including the following:        Yes   Patient/family informed of bed offers received.  Patient chooses bed at [facility]     Physician recommends and patient chooses bed at      Patient to be transferred to [facility] on 09/26/16.  Patient to be transferred to facility by [facility]     Patient family notified on 09/26/16 of transfer.  Name of family member notified:  Several messages left on son's VM.     PHYSICIAN       Additional Comment: Pt in agreement with d/c to [facility] today. CSW has left several VM on son's cell. Pt reports that son puts his phone on mute while he is working and will check his phone after 5pm. Pt will not be ready for transport to SNF until 7 pm. NSG will continue to attempt to contact son with dc update. Marland Kitchen. [facility] transport is required. Medical necessity form completed. Pt is aware out pocket cost may be associated with [facility] transport. Scripts included in [employer] packet. # for report provided to nsg.   _______________________________________________ Royetta AsalHaidinger, Aubriauna Riner Lee, LCSW  [phone] 09/26/2016, 2:33 PM

## 2016-09-26 NOTE — Progress Notes (Signed)
     Subjective: 2 Days Post-Op Procedure(s) (LRB): CANNULATED HIP PINNING LEFT HIP (Left)   Patient reports pain as mild, pain is controlled. No reported events throughout the night.  States that the plan is for her to go to SNF today.  Discussed seeing her back int he clinic in 2 weeks.  Objective:   VITALS:   Vitals:   09/26/16 1200 09/26/16 1332  BP: 137/86 114/67  Pulse: (!) 106 88  Resp: 16 18  Temp: 98.1 F (36.7 C) 98.7 F (37.1 C)    Dorsiflexion/Plantar flexion intact Incision: dressing C/Payne/I No cellulitis present Compartment soft  LABS  Recent Labs  09/24/16 0002 09/25/16 0559 09/26/16 0522  HGB 12.9 10.0* 8.2*  HCT 37.9 30.1* 23.7*  WBC 13.0* 11.0* 9.0  PLT 259 199 168     Recent Labs  09/24/16 0002 09/25/16 0559 09/26/16 0522  NA 133* 133* 132*  K 4.0 4.8 4.0  BUN 16 23* 21*  CREATININE 1.25* 1.46* 1.16*  GLUCOSE 129* 99 148*     Assessment/Plan: 2 Days Post-Op Procedure(s) (LRB): CANNULATED HIP PINNING LEFT HIP (Left) Up with therapy Discharge when medically stable   Ortho recommendations:  ASA 81 mg bid for 4 weeks for anticoagulation, unless other medically indicated.  Norco for pain management (Rx written).  Robaxin for muscle spasms (Rx written).  MiraLax and Colace for constipation  Iron 325 mg tid for 2-3 weeks   PWB 50% on the left leg.  Dressing to remain in place until follow in clinic in 2 weeks.  Dressing is waterproof and may shower with it in place.  [facility]. Follow up with Tonya Payne,Tonya Payne in 2 weeks.  Contact information:  [facility] [address] [place] [id number] [phone]         Tonya Payne   PAC  09/26/2016, 1:43 PM

## 2016-09-26 NOTE — Care Management Note (Signed)
Case Management Note  Patient Details  Name: Tonya Payne MRN: [id number] Date of Birth: May 11, 1934  Subjective/Objective: CSW following-D/C SNF.                   Action/Plan:d/c SNF.   Expected Discharge Date:  09/26/16               Expected Discharge Plan:  Skilled Nursing Facility  In-House Referral:  Clinical Social Work  Discharge planning Services  CM Consult  Post Acute Care Choice:    Choice offered to:     DME Arranged:    DME Agency:     HH Arranged:    HH Agency:     Status of Service:  Completed, signed off  If discussed at [employer] Length of [employer] Meetings, dates discussed:    Additional Comments:  Lanier ClamMahabir, Kong Packett, RN 09/26/2016, 11:13 AM

## 2016-09-26 NOTE — Discharge Summary (Signed)
Physician Discharge Summary  Tonya Payne ZOX:[id number] DOB: 06/28/1934 DOA: 09/23/2016  PCP: Rocky Morel, MD  Admit date: 09/23/2016 Discharge date: 09/26/2016  Admitted From: Home Disposition:  SNF  Recommendations for Outpatient Follow-up:  1. Follow up with PCP in 1-2 weeks 2. Follow up with Orthopedic Surgery Dr.Olin as an outpatient within 2 weeks 3. Follow up with Vascular Surgery as an outpatient for consultation of AAA (now 4.9 cm) 4. Will need repeat CTA Scan of AAA in 6 months 5. Please obtain CMP/CBC, Mag, Phos in one week  Home Health: No Equipment/Devices: None recommended by PT  Discharge Condition: Stable CODE STATUS: DO NOT RESUSCITATE Diet recommendation: Heart Healthy   Brief/Interim Summary: The Patient is an 82 year old female with history of coronary artery disease status post CABG in June 2017, postop transient A. fib, AAA, COPD and other comorbidities who was brought to the ED  after having a mechanical fall and was found to have a hip fracture. Orthopedic surgery was consulted and took the patient to surgery 5/22 and she is s/p Cannulated Hip Pinning POD 2. PT was consulted and recommend SNF. During the hospitalization, patient's Hb/Hct started dropping likely a combination of post-operative drop and dilutional drop. She was transfused 1 unit of pRBC. She is improved and at this time deemed medically stable to be D/C'd to SNF.   Discharge Diagnoses:  Active Problems:   Coronary artery disease involving native coronary artery with angina pectoris (HCC)   S/P CABG x 3   AAA (abdominal aortic aneurysm) without rupture (HCC)   Hip fracture (HCC)   Hyponatremia   Hypertension   Left displaced femoral neck fracture (HCC)   CKD (chronic kidney disease), stage III  Left hip fracture in the setting of mechanical fall s/p Cannulated Left Hip Pinning POD2 -Patient was at moderate risk for the procedure.  -She has a history of heart disease status post CABG,  this appears stable.  -She denied any chest pains or any other symptoms with activities at home, denies any lower extremity swelling and otherwise appeared stable for surgery. -Patient was taken to Surgery last night for her Nondisplaced Left Subcapital Femoral Neck Fracture and is POD2 of Closed reduction and percutaneous cannulated screw fixation of the left hip fracture using Biomet 8.0-mm screws with 16-mm threads -Ortho followed and appreciated Recc's -Recommended ASA 81 mg po BID x 4 weeks -Norco and Robaxin written by Ortho -Bowel Regimen with Miralax and Colace for Constipation -Per Ortho Partial Weight Bearing 50% on the Left Leg, Dressing to remain in place until Clinic Follow up in 2 weeks -Follow up with Dr. Charlann Boxer in [facility] in 2 weeks -PT/OT Recommend SNF; Social Worker Consulted for placement and will d/C today  Acute Blood Loss Anemia -Likely post operative Drop in combination with dilutional drop as patient was on IVF; IVF have now D/C'd  -Hb/Hct went from 12.9/37.9 -> 10.0/30.1 -> 8.2/23.7 -Will transfuse 1 unit of pRBC prior to D/C -Repeat CBC at SNF -Patient started on Ferrous Sulfate 325 mg po TID by Orthopedics -Continue to Monitor S/Sx of Bleeding  Coronary artery disease status post CABG -Her chest pain equivalent was left arm and left hand pain which were present prior to her MI and CABG, she has not had any of these since her surgery.  -She is able to do her ADLs using a walker without any exertional dyspnea or arm pain. -C/w ASA 81 mg po BID, Atorvastatin 40 mg po Daily and Metoprolol 12.5 mg po  BID -Follow up with Cardioloy as an outpatient  Hypertension -Continue home medications of Metoprolol 12.5 mg po BID  Sinus tachycardia, improving -Continue Metoprolol 12.5 mg po BID, likely due to pain.  -Monitored on telemetry for recurrence of A. fib, if this is happening again may need anticoagulation -Follow up as an outpatient   History  of Atrial Fibrillation -This apparently happened in the setting of her CABG, probably transient, not on anticoagulation currently -C/w Metoprolol 12.5 mg po BID  AAA -Increased in size from 4.7 -> 4.9 since last exam -Aortic Aneurysm was 4.9 cm. Recommend followup by abdomen and pelvis CTA in 6 months, and vascular surgery referral. -Outpatient management and surveillance.  -Will need Vascular Surgery Consultation as an outpatient  Hyponatremia -Na+ was 132 -D/C'd with IVF with NS at 75 mL/hr -Repeat CMP at SNF  Hypophosphetemia  -Patient's Phos Level was 1.9 -Replete with 30 mmol of IV Sodium Phos -Repeat Phos level at SNF  Chronic kidney disease stage III -Creatinine appears close to baseline -BUN/Cr went from 16/1.25 -> 23/1.46 -> 21/1.16 -Repeat CMP at SNF  Leukocytosis -Likely Reactive; WBC improved from 13.0 -> 11.0 -> 9.0 -Continue to Monitor for S/Sx of Infection -Repeat CBC at SNF  COPD -Not in exacerbation  Depression -C/w Sertraline 50 mg po Every morning  Hyperlipidemia -C/w Home Atorvastatin 40 mg po qDaily  Discharge Instructions  Discharge Instructions    Call MD for:  difficulty breathing, headache or visual disturbances    Complete by:  As directed    Call MD for:  extreme fatigue    Complete by:  As directed    Call MD for:  persistant dizziness or light-headedness    Complete by:  As directed    Call MD for:  persistant nausea and vomiting    Complete by:  As directed    Call MD for:  severe uncontrolled pain    Complete by:  As directed    Call MD for:  temperature >100.4    Complete by:  As directed    Diet - low sodium heart healthy    Complete by:  As directed    Discharge instructions    Complete by:  As directed    Follow up Care at SNF and follow up with Orthopedic Surgery Dr. Charlann Boxerlin as an outpatient. Take all medications as prescribed. If symptoms changer or worsen please return to the ED for evaluation.   Increase activity  slowly    Complete by:  As directed    Partial weight bearing    Complete by:  As directed    % Body Weight:  50   Laterality:  left   Extremity:  Lower     Allergies as of 09/26/2016   No Known Allergies     Medication List    STOP taking these medications   traMADol 50 MG tablet Commonly known as:  ULTRAM     TAKE these medications   aspirin 81 MG chewable tablet Chew 1 tablet (81 mg total) by mouth 2 (two) times daily. Take for 4 weeks.   atorvastatin 40 MG tablet Commonly known as:  LIPITOR Take 1 tablet (40 mg total) by mouth daily at 6 PM. What changed:  when to take this   docusate sodium 100 MG capsule Commonly known as:  COLACE Take 1 capsule (100 mg total) by mouth 2 (two) times daily.   feeding supplement (ENSURE ENLIVE) Liqd Take 237 mLs by mouth 2 (two) times daily  between meals.   ferrous sulfate 325 (65 FE) MG tablet Commonly known as:  FERROUSUL Take 1 tablet (325 mg total) by mouth 3 (three) times daily with meals.   fexofenadine 180 MG tablet Commonly known as:  ALLEGRA Take 180 mg by mouth every morning.   HYDROcodone-acetaminophen 7.5-325 MG tablet Commonly known as:  NORCO Take 1-2 tablets by mouth every 4 (four) hours as needed for moderate pain or severe pain.   methocarbamol 500 MG tablet Commonly known as:  ROBAXIN Take 1 tablet (500 mg total) by mouth every 6 (six) hours as needed for muscle spasms.   metoprolol tartrate 25 MG tablet Commonly known as:  LOPRESSOR Take 12.5 mg by mouth 2 (two) times daily.   MULTIVITAMIN ADULTS PO Take 1 tablet by mouth daily at 12 noon.   OLANZapine 5 MG tablet Commonly known as:  ZYPREXA Take 1 tablet by mouth at bedtime.   polyethylene glycol packet Commonly known as:  MIRALAX / GLYCOLAX Take 17 g by mouth 2 (two) times daily.   sertraline 50 MG tablet Commonly known as:  ZOLOFT Take 50 mg by mouth every morning.      Follow-up Information    Durene Romans, MD. Schedule an  appointment as soon as possible for a visit in 2 week(s).   Specialty:  Orthopedic Surgery Contact information: [address] [place] [address] [phone]        Rocky Morel, MD. Call in 1 week(s).   Specialty:  Internal Medicine Why:  After patient gets ot of SNF Contact information: [address] [place] [address] [phone]          No Known Allergies  Consultations:  Orthopedic Surgery Dr. Charlann Boxer  Procedures/Studies: Dg Chest 2 View  Result Date: 09/23/2016 CLINICAL DATA:  Fall EXAM: CHEST  2 VIEW COMPARISON:  06/05/2016 FINDINGS: Lower cervical hardware. Post sternotomy changes of the mediastinum. Lungs appear hyperinflated. There is no focal consolidation or effusion. Mild fibrosis at the lung bases. No pneumothorax. Old appearing right eighth and tenth rib fractures. IMPRESSION: 1. Bibasilar fibrosis. No acute infiltrate, pleural effusion or pneumothorax. 2. Old appearing right rib fractures Electronically Signed   By: Jasmine Pang M.D.   On: 09/23/2016 18:04   Dg Pelvis 1-2 Views  Result Date: 09/23/2016 CLINICAL DATA:  Fall with pain in both hips EXAM: PELVIS - 1-2 VIEW COMPARISON:  01/26/2016 FINDINGS: SI joints symmetric. Postsurgical changes in the pelvis. Calcified pelvic phleboliths. Pubic symphysis appears intact. No dislocation. Old right superior pubic ramus fracture. Possible step-off deformity at the left subcapital femur. IMPRESSION: 1. Possible step-off deformity within the subcapital left femur, recommend CT for further evaluation. 2. Old appearing right superior pubic ramus fracture Electronically Signed   By: Jasmine Pang M.D.   On: 09/23/2016 18:06   Dg Tibia/fibula Left  Result Date: 09/23/2016 CLINICAL DATA:  Fall with laceration EXAM: LEFT TIBIA AND FIBULA - 2 VIEW COMPARISON:  None. FINDINGS: Vascular calcifications. No acute displaced fracture or malalignment. No radiopaque foreign body. IMPRESSION:  No acute osseous abnormality Electronically Signed   By: Jasmine Pang M.D.   On: 09/23/2016 18:07   Ct Head Wo Contrast  Result Date: 09/23/2016 CLINICAL DATA:  Recent trip and fall with pain, initial encounter EXAM: CT HEAD WITHOUT CONTRAST CT CERVICAL SPINE WITHOUT CONTRAST TECHNIQUE: Multidetector CT imaging of the head and cervical spine was performed following the standard protocol without intravenous contrast. Multiplanar CT image reconstructions of the cervical  spine were also generated. COMPARISON:  06/05/2016 FINDINGS: CT HEAD FINDINGS Brain: Atrophic changes and chronic white matter ischemic changes are seen. No findings to suggest acute hemorrhage, acute infarction or space-occupying mass lesion are seen. Changes of prior right occipital infarct are noted. Prior lacunar infarct is also noted in the region of the basal ganglia on the left stable from the prior exam. Vascular: No hyperdense vessel or unexpected calcification. Skull: Normal. Negative for fracture or focal lesion. Sinuses/Orbits: No acute finding. Other: None. CT CERVICAL SPINE FINDINGS Alignment: Mild anterolisthesis of C7 on T1 stable from the prior exam. Skull base and vertebrae: 7 cervical segments are well visualized. Changes of prior fusion are noted at C5-6 and C6-7. Facet hypertrophic changes are noted. No acute fracture or acute facet abnormality is noted. Soft tissues and spinal canal: No acute soft tissue abnormality is noted. Diffuse vascular calcifications are seen. Upper chest: Stable scarring in the right upper lobe is noted. Mild emphysematous changes are seen. IMPRESSION: CT of the head: Chronic changes without acute abnormality. CT of the cervical spine: Postsurgical and degenerative changes stable from the prior exam. Electronically Signed   By: Alcide Clever M.D.   On: 09/23/2016 18:59   Ct Cervical Spine Wo Contrast  Result Date: 09/23/2016 CLINICAL DATA:  Recent trip and fall with pain, initial encounter EXAM: CT  HEAD WITHOUT CONTRAST CT CERVICAL SPINE WITHOUT CONTRAST TECHNIQUE: Multidetector CT imaging of the head and cervical spine was performed following the standard protocol without intravenous contrast. Multiplanar CT image reconstructions of the cervical spine were also generated. COMPARISON:  06/05/2016 FINDINGS: CT HEAD FINDINGS Brain: Atrophic changes and chronic white matter ischemic changes are seen. No findings to suggest acute hemorrhage, acute infarction or space-occupying mass lesion are seen. Changes of prior right occipital infarct are noted. Prior lacunar infarct is also noted in the region of the basal ganglia on the left stable from the prior exam. Vascular: No hyperdense vessel or unexpected calcification. Skull: Normal. Negative for fracture or focal lesion. Sinuses/Orbits: No acute finding. Other: None. CT CERVICAL SPINE FINDINGS Alignment: Mild anterolisthesis of C7 on T1 stable from the prior exam. Skull base and vertebrae: 7 cervical segments are well visualized. Changes of prior fusion are noted at C5-6 and C6-7. Facet hypertrophic changes are noted. No acute fracture or acute facet abnormality is noted. Soft tissues and spinal canal: No acute soft tissue abnormality is noted. Diffuse vascular calcifications are seen. Upper chest: Stable scarring in the right upper lobe is noted. Mild emphysematous changes are seen. IMPRESSION: CT of the head: Chronic changes without acute abnormality. CT of the cervical spine: Postsurgical and degenerative changes stable from the prior exam. Electronically Signed   By: Alcide Clever M.D.   On: 09/23/2016 18:59   Ct Pelvis Wo Contrast  Result Date: 09/23/2016 CLINICAL DATA:  Evaluate for hip fracture. EXAM: CT PELVIS WITHOUT CONTRAST TECHNIQUE: Multidetector CT imaging of the pelvis was performed following the standard protocol without intravenous contrast. COMPARISON:  01/26/2016. FINDINGS: Urinary Tract:  No abnormality visualized. Bowel:  Unremarkable  visualized pelvic bowel loops. Vascular/Lymphatic: Aortic atherosclerosis. Infrarenal abdominal aortic aneurysm measures 4.9 cm, image number 1 of series 4. This is increased from 4.7 cm previously. No adenopathy identified. Reproductive:  No mass or other significant abnormality Other:  None. Musculoskeletal: The bones appear diffusely osteopenic. There is an acute slightly impacted subcapital left femoral neck fracture, image 51 of series 6. No dislocation. IMPRESSION: 1. Acute slightly impacted subcapital femoral neck fracture involves  the left hip. 2. Aortic aneurysm NOS (ICD10-I71.9). This measures 4.9 cm. Recommend followup by abdomen and pelvis CTA in 6 months, and vascular surgery referral/consultation if not already obtained. This recommendation follows ACR consensus guidelines: White Paper of the ACR Incidental Findings Committee II on Vascular Findings. J Am Coll Radiol 2013; 78:295-621. 3.  Aortic Atherosclerosis (ICD10-I70.0). 4. These results will be called to the ordering clinician or representative by the Radiologist Assistant, and communication documented in the PACS or zVision Dashboard. Electronically Signed   By: Signa Kell M.D.   On: 09/23/2016 19:29   Dg Knee Complete 4 Views Right  Result Date: 09/23/2016 CLINICAL DATA:  Fall with pain to the anterior knee EXAM: RIGHT KNEE - COMPLETE 4+ VIEW COMPARISON:  09/21/2015 FINDINGS: Status post wire fixation of patella with fracture through the left upper limb as before. No acute displaced fracture or malalignment. Mild medial and lateral degenerative changes. No large effusion. Vascular calcifications. IMPRESSION: Postsurgical changes of the patella. No definite acute osseous abnormality. Electronically Signed   By: Jasmine Pang M.D.   On: 09/23/2016 18:10   Dg Abd Portable 1v  Result Date: 09/24/2016 CLINICAL DATA:  Abdominal pain.  Left hip fracture. EXAM: PORTABLE ABDOMEN - 1 VIEW COMPARISON:  CT scan of the pelvis dated 09/23/2016  FINDINGS: The bowel gas pattern is normal. There is a fusiform abdominal aortic aneurysm which measures 6.5 cm, uncorrected for magnification. No visible free air or free fluid in the abdomen. Left proximal femoral fracture is noted. Chronic interstitial disease is noted at both lung bases. IMPRESSION: 6.5 cm abdominal aortic aneurysm. Left hip fracture. Otherwise benign appearing abdomen and pelvis. Electronically Signed   By: Francene Boyers M.D.   On: 09/24/2016 09:27   Dg C-arm 1-60 Min-no Report  Result Date: 09/24/2016 Fluoroscopy was utilized by the requesting physician.  No radiographic interpretation.   Dg Hip Operative Unilat With Pelvis Left  Result Date: 09/24/2016 CLINICAL DATA:  82 year old female with left femur fracture. Subsequent encounter. EXAM: OPERATIVE left HIP (WITH PELVIS IF PERFORMED) 2 VIEWS TECHNIQUE: Fluoroscopic spot image(s) were submitted for interpretation post-operatively. COMPARISON:  09/23/2016 FINDINGS: Three screws utilized to transfix left femoral neck fracture without complication noted. Screws are contain within the femoral head. IMPRESSION: Three screws utilized to transfix left femoral neck fracture without complication noted. Electronically Signed   By: Lacy Duverney M.D.   On: 09/24/2016 20:39   Subjective: Seen and examined and was feeling better. Had no nausea or vomiting and had some pain in leg. No CP or SOB. Ready to go to SNF to get stronger.   Discharge Exam: Vitals:   09/26/16 0845 09/26/16 0927  BP: 140/76 (!) 142/68  Pulse: (!) 114 (!) 106  Resp: 18 16  Temp: 98.9 F (37.2 C) 98.3 F (36.8 C)   Vitals:   09/26/16 0032 09/26/16 0413 09/26/16 0845 09/26/16 0927  BP: 138/78 (!) 145/73 140/76 (!) 142/68  Pulse: (!) 105 (!) 108 (!) 114 (!) 106  Resp: 16 18 18 16   Temp: 98.4 F (36.9 C) 98.4 F (36.9 C) 98.9 F (37.2 C) 98.3 F (36.8 C)  TempSrc: Oral Oral Oral Oral  SpO2: 95% 93% 92% 97%  Weight:      Height:       General: Pt is  alert, awake, not in acute distress Cardiovascular: Slightly tachycardic rate but regular rhythm, S1/S2 +, no rubs, no gallops Respiratory: CTA bilaterally, no wheezing, no rhonchi Abdominal: Soft, NT, ND, bowel sounds +;  Has midline abdominal incision. Paplable Aneurysm  Extremities: no edema, no cyanosis; Left Hip incision C/D/I  The results of significant diagnostics from this hospitalization (including imaging, microbiology, ancillary and laboratory) are listed below for reference.    Microbiology: Recent Results (from the past 240 hour(s))  Surgical PCR screen     Status: None   Collection Time: 09/24/16 12:01 AM  Result Value Ref Range Status   MRSA, PCR NEGATIVE NEGATIVE Final   Staphylococcus aureus NEGATIVE NEGATIVE Final    Comment:        The Xpert SA Assay (FDA approved for NASAL specimens in patients over 2 years of age), is one component of a comprehensive surveillance program.  Test performance has been validated by [facility] for patients greater than or equal to 55 year old. It is not intended to diagnose infection nor to guide or monitor treatment.     Labs: BNP (last 3 results) No results for input(s): BNP in the last 8760 hours. Basic Metabolic Panel:  Recent Labs Lab 09/23/16 1810 09/24/16 0002 09/25/16 0559 09/26/16 0522  NA 129* 133* 133* 132*  K 4.3 4.0 4.8 4.0  CL 95* 100* 102 102  CO2 22 23 20* 22  GLUCOSE 104* 129* 99 148*  BUN 19 16 23* 21*  CREATININE 1.47* 1.25* 1.46* 1.16*  CALCIUM 9.4 9.0 8.6* 8.2*  MG  --   --   --  1.7  PHOS  --   --   --  1.9*   Liver Function Tests:  Recent Labs Lab 09/23/16 1810 09/24/16 0002 09/26/16 0522  AST 31 31 27   ALT 21 19 10*  ALKPHOS 120 109 75  BILITOT 0.8 0.8 0.4  PROT 7.6 6.9 5.6*  ALBUMIN 4.2 3.9 2.8*   No results for input(s): LIPASE, AMYLASE in the last 168 hours. No results for input(s): AMMONIA in the last 168 hours. CBC:  Recent Labs Lab 09/23/16 1810 09/24/16 0002  09/25/16 0559 09/26/16 0522  WBC 9.4 13.0* 11.0* 9.0  NEUTROABS 7.2  --   --  7.2  HGB 12.7 12.9 10.0* 8.2*  HCT 36.6 37.9 30.1* 23.7*  MCV 93.1 94.3 97.4 95.6  PLT 244 259 199 168   Cardiac Enzymes: No results for input(s): CKTOTAL, CKMB, CKMBINDEX, TROPONINI in the last 168 hours. BNP: Invalid input(s): POCBNP CBG: No results for input(s): GLUCAP in the last 168 hours. D-Dimer No results for input(s): DDIMER in the last 72 hours. Hgb A1c No results for input(s): HGBA1C in the last 72 hours. Lipid Profile No results for input(s): CHOL, HDL, LDLCALC, TRIG, CHOLHDL, LDLDIRECT in the last 72 hours. Thyroid function studies No results for input(s): TSH, T4TOTAL, T3FREE, THYROIDAB in the last 72 hours.  Invalid input(s): FREET3 Anemia work up No results for input(s): VITAMINB12, FOLATE, FERRITIN, TIBC, IRON, RETICCTPCT in the last 72 hours. Urinalysis    Component Value Date/Time   COLORURINE STRAW (A) 09/24/2016 0001   APPEARANCEUR CLEAR 09/24/2016 0001   LABSPEC 1.006 09/24/2016 0001   PHURINE 7.0 09/24/2016 0001   GLUCOSEU 50 (A) 09/24/2016 0001   HGBUR NEGATIVE 09/24/2016 0001   BILIRUBINUR NEGATIVE 09/24/2016 0001   KETONESUR 5 (A) 09/24/2016 0001   PROTEINUR NEGATIVE 09/24/2016 0001   UROBILINOGEN 0.2 02/25/2015 1826   NITRITE NEGATIVE 09/24/2016 0001   LEUKOCYTESUR NEGATIVE 09/24/2016 0001   Sepsis Labs Invalid input(s): PROCALCITONIN,  WBC,  LACTICIDVEN Microbiology Recent Results (from the past 240 hour(s))  Surgical PCR screen     Status: None  Collection Time: 09/24/16 12:01 AM  Result Value Ref Range Status   MRSA, PCR NEGATIVE NEGATIVE Final   Staphylococcus aureus NEGATIVE NEGATIVE Final    Comment:        The Xpert SA Assay (FDA approved for NASAL specimens in patients over 72 years of age), is one component of a comprehensive surveillance program.  Test performance has been validated by [facility] for patients greater than or equal to 1 year  old. It is not intended to diagnose infection nor to guide or monitor treatment.    Time coordinating discharge: 35 minutes  SIGNED:  Merlene Laughter, DO [facility] 09/26/2016, 10:48 AM Pager [phone]  If 7PM-7AM, please contact night-coverage [email or web address] Password [email or web address]

## 2016-09-26 NOTE — Progress Notes (Signed)
Pt being discharged to [facility]. Report called to Deseree, RN. Informed staff that pt will not be leaving until 2000 due to sodium phosphate infusion not completing until 1900. No questions or concerns at this time.  Family will be notified.  Jaidan Prevette W Vianka Ertel, RN

## 2016-09-27 LAB — BPAM RBC
Blood Product Expiration Date: 201806062359
ISSUE DATE / TIME: 201805240845
Unit Type and Rh: 6200

## 2016-09-27 LAB — TYPE AND SCREEN
ABO/RH(D): A POS
Antibody Screen: NEGATIVE
UNIT DIVISION: 0

## 2016-09-28 ENCOUNTER — Non-Acute Institutional Stay (SKILLED_NURSING_FACILITY): Payer: Medicare Other | Admitting: Internal Medicine

## 2016-09-28 DIAGNOSIS — S72002P Fracture of unspecified part of neck of left femur, subsequent encounter for closed fracture with malunion: Secondary | ICD-10-CM | POA: Diagnosis not present

## 2016-09-28 DIAGNOSIS — I714 Abdominal aortic aneurysm, without rupture, unspecified: Secondary | ICD-10-CM

## 2016-09-28 DIAGNOSIS — I255 Ischemic cardiomyopathy: Secondary | ICD-10-CM | POA: Diagnosis not present

## 2016-09-28 DIAGNOSIS — I2589 Other forms of chronic ischemic heart disease: Secondary | ICD-10-CM | POA: Diagnosis not present

## 2016-09-28 DIAGNOSIS — R296 Repeated falls: Secondary | ICD-10-CM

## 2016-09-28 NOTE — Progress Notes (Signed)
Facility; [facility]  09/28/16  Chief complaint; admission to SNF post stay at [facility] 09/23/16 through 09/26/16  History; this is an 72 year-year-old woman who lives on her own in her own apartment. She suffered a fall in the kitchen although she is not able to give me any details. She states she simply tripped on something. She states this was not associated with loss of consciousness. She is able to tell me she has had multiple falls but again she can't really quantitate her give a history here. In any case she suffered a left hip fracture and underwent a left hip pinning. Surgery by Dr. Charlann Boxer. Seems to a been relatively uncomplicated. She was transfused 1 unit.  Looking through Care everywhere I think she is being followed in [facility] with [facility]. I see listed amyloid nephropathy, cardiomyopathy presumably ischemic she is status post CABG. She apparently had a transient history of atrial fibrillation at the time of her CABG. She had a CT scan of the abdomen in the hospital which showed a sizable AAA at 4.9 cm. The patient seems to know about this but is not clear who is following her.  CBC Latest Ref Rng & Units 09/26/2016 09/25/2016 09/24/2016  WBC 4.0 - 10.5 K/uL 9.0 11.0(H) 13.0(H)  Hemoglobin 12.0 - 15.0 g/dL 8.2(L) 10.0(L) 12.9  Hematocrit 36.0 - 46.0 % 23.7(L) 30.1(L) 37.9  Platelets 150 - 400 K/uL 168 199 259    BMP Latest Ref Rng & Units 09/26/2016 09/25/2016 09/24/2016  Glucose 65 - 99 mg/dL 130(Q) 99 657(Q)  BUN 6 - 20 mg/dL 46(N) 62(X) 16  Creatinine 0.44 - 1.00 mg/dL 5.28(U) 1.32(G) 4.01(U)  Sodium 135 - 145 mmol/L 132(L) 133(L) 133(L)  Potassium 3.5 - 5.1 mmol/L 4.0 4.8 4.0  Chloride 101 - 111 mmol/L 102 102 100(L)  CO2 22 - 32 mmol/L 22 20(L) 23  Calcium 8.9 - 10.3 mg/dL 8.2(L) 8.6(L) 9.0      EXAM: CT PELVIS WITHOUT CONTRAST   TECHNIQUE: Multidetector CT imaging of the pelvis was performed following the standard protocol  without intravenous contrast.   COMPARISON:  01/26/2016.   FINDINGS: Urinary Tract:  No abnormality visualized.   Bowel:  Unremarkable visualized pelvic bowel loops.   Vascular/Lymphatic: Aortic atherosclerosis. Infrarenal abdominal aortic aneurysm measures 4.9 cm, image number 1 of series 4. This is increased from 4.7 cm previously. No adenopathy identified.   Reproductive:  No mass or other significant abnormality   Other:  None.   Musculoskeletal: The bones appear diffusely osteopenic. There is an acute slightly impacted subcapital left femoral neck fracture, image 51 of series 6. No dislocation.   IMPRESSION: 1. Acute slightly impacted subcapital femoral neck fracture involves the left hip. 2. Aortic aneurysm NOS (ICD10-I71.9). This measures 4.9 cm. Recommend followup by abdomen and pelvis CTA in 6 months, and vascular surgery referral/consultation if not already obtained. This recommendation follows ACR consensus guidelines: White Paper of the ACR Incidental Findings Committee II on Vascular Findings. J Am Coll Radiol 2013; 27:253-664. 3.  Aortic Atherosclerosis (ICD10-I70.0). 4. These results will be called to the ordering clinician or representative by the Radiologist Assistant, and communication documented in the PACS or zVision Dashboard.     Past Medical History:  Diagnosis Date  . AAA (abdominal aortic aneurysm) without rupture (HCC) 2016   CTA 10/23/2015 - saccular infrarenal aneurysm roughly 4.8 cm in diameter. Stable from 2016 -- Dr. Darrick Penna  . Anxiety   . Atrial fibrillation, new onset (  HCC) 10/2015   after CABG, in SR at d/c  . CAD (coronary artery disease), native coronary artery 10/08/2015   Severe coronary disease with calcified left main and LAD: 5% left main going into ostial LAD. Mid LAD 95% (treated with PTCA). Proximal RCA 50%, calcified. EF was 20-25% with apical akinesis and distal anterior hypokinesis. --> Referred for CABG  . Congestive dilated  cardiomyopathy (HCC) 10/08/2015   Intra-Op TEE showed EF of 40 and 45%. She had an echo with a "normal LV function "documented, but there was no reading M.D.  . COPD (chronic obstructive pulmonary disease) (HCC)   . Depression   . History of acute anterior wall MI 10/08/2015   Presented with severe chest pain and dynamic anterior ST elevations/biphasic elevations area did not meet full criteria for STEMI, and troponin was not dramatically elevated. 95% LAD treated with PTCA followed by CABG  . Hyperlipidemia LDL goal <70 10/08/2015  . IBS (irritable bowel syndrome)   . Oral thrush   . Post PTCA 10/08/2015   Presented with STEMI, severe mid LAD lesion treated with PTCA and then referred for CABG.  . TIA (transient ischemic attack)    Past Surgical History:  Procedure Laterality Date  . BACK SURGERY    . CARDIAC CATHETERIZATION N/A 10/06/2015   Procedure: Left Heart Cath and Coronary Angiography;  Surgeon: Marykay Lex, MD;  Location: [facility] INVASIVE CV LAB;  Service: Cardiovascular;: Heavily calcified left main-LAD. 75% LM & Ost LAD, 95% d-mLAD, p-mRCA Calcified 50%. EF ~20-25%  . CARDIAC CATHETERIZATION N/A 10/06/2015   Procedure: Coronary Balloon Angioplasty;  Surgeon: Marykay Lex, MD;  Location: [facility] INVASIVE CV LAB;  Service: Cardiovascular: PTCA of 95% LAD in setting of STEMI - reduced to 70% --> would need Atherectomy-PCI vs. CABG.  Sent for CABG.  . COLECTOMY    . CORONARY ARTERY BYPASS GRAFT N/A 10/16/2015   Procedure: CORONARY ARTERY BYPASS GRAFTING (CABG)  x three, using left internal mammary artery and right leg greater saphenous vein harvested endoscopically;  Surgeon: Loreli Slot, MD;  Location: [facility] OR;  Service: Open Heart Surgery;  Laterality: N/A;  . HIP PINNING,CANNULATED Left 09/24/2016   Procedure: CANNULATED HIP PINNING LEFT HIP;  Surgeon: Durene Romans, MD;  Location: [facility] ORS;  Service: Orthopedics;  Laterality: Left;  . NECK SURGERY    . TEE WITHOUT CARDIOVERSION N/A  10/16/2015   Procedure: TRANSESOPHAGEAL ECHOCARDIOGRAM (TEE);  Surgeon: Loreli Slot, MD;  Location: [facility] OR;  Service: Open Heart Surgery - IntraOp:  EF 40-45%.    Current Outpatient Prescriptions on File Prior to Visit  Medication Sig Dispense Refill  . aspirin 81 MG chewable tablet Chew 1 tablet (81 mg total) by mouth 2 (two) times daily. Take for 4 weeks. 60 tablet 0  . atorvastatin (LIPITOR) 40 MG tablet Take 1 tablet (40 mg total) by mouth daily at 6 PM. (Patient taking differently: Take 40 mg by mouth every morning. ) 30 tablet 3  . docusate sodium (COLACE) 100 MG capsule Take 1 capsule (100 mg total) by mouth 2 (two) times daily. 10 capsule 0  . feeding supplement, ENSURE ENLIVE, (ENSURE ENLIVE) LIQD Take 237 mLs by mouth 2 (two) times daily between meals. 237 mL 12  . ferrous sulfate (FERROUSUL) 325 (65 FE) MG tablet Take 1 tablet (325 mg total) by mouth 3 (three) times daily with meals.    . fexofenadine (ALLEGRA) 180 MG tablet Take 180 mg by mouth every morning.     Marland Kitchen  HYDROcodone-acetaminophen (NORCO) 7.5-325 MG tablet Take 1-2 tablets by mouth every 4 (four) hours as needed for moderate pain or severe pain. 60 tablet 0  . methocarbamol (ROBAXIN) 500 MG tablet Take 1 tablet (500 mg total) by mouth every 6 (six) hours as needed for muscle spasms. 40 tablet 0  . metoprolol tartrate (LOPRESSOR) 25 MG tablet Take 12.5 mg by mouth 2 (two) times daily.  3  . Multiple Vitamins-Minerals (MULTIVITAMIN ADULTS PO) Take 1 tablet by mouth daily at 12 noon.    Marland Kitchen. OLANZapine (ZYPREXA) 5 MG tablet Take 1 tablet by mouth at bedtime.  3  . polyethylene glycol (MIRALAX / GLYCOLAX) packet Take 17 g by mouth 2 (two) times daily. 14 each 0  . sertraline (ZOLOFT) 50 MG tablet Take 50 mg by mouth every morning.       Social; patient states she lives in her own apartment. But she cannot tell me where this is. She states she just moved from the [place] which I believe is also independent. She has a  history of smoking but is stopped. She states she falls frequently but again very little detail here. Not really clear about her functional status  reports that she quit smoking about 17 months ago. Her smoking use included Cigarettes. She smoked 0.50 packs per day. She has never used smokeless tobacco. She reports that she drinks about 1.2 oz of alcohol per week . She reports that she does not use drugs.  family history includes Clotting disorder in her father; Stroke in her mother.  Review of systems Gen. the patient states she has not lost weight HEENT no oral pain Respiratory short of breath on exertion but no cough Cardiac no complaints of chest pain or palpitations GI no nausea no vomiting no diarrhea GU no dysuria states she voids frequently Musculoskeletal left hip pain from surgery does not complain of other pain issues  Physical examination Gen. patient is not in any distress, somewhat diaphoretic Vitals; temperature 98.2 blood pressure 136/54 pulse 80 respirations 18 O2 sat is 97% HEENT no oral lesions. Respiratory clear entry bilaterally Cardiac heart sounds are normal no murmurs Abdomen no masses no tenderness no liver no spleen GU bladder is not enlarged no tenderness Neurologic; she has antigravity strength in both legs. Mental status; anxious. Seems quite forgetful at this point although she is grossly orientated.  Impression/plan #1 mechanical fall apparently with a history of mechanical falls. The cause of this is not clear #2 left hip fracture, prior history of pelvic fractures last year. Patient should be screened for osteoporosis when she leaves the facility. #3 abdominal aortic aneurysm at 4.9 cm. The patient seems somewhat aware of this low she is not able to tell me who was following her. She certainly needs follow-up of this presumably with vascular surgery #4 chronic renal failure; there are notes in care everywhere that suggests that this woman has amyloid  nephropathy I'm not sure why or how this diagnosis was made. If that's true one would wonder whether she has amyloid autonomic and peripheral neuropathy. She should be properly screened for orthostatic hypotension #5 on Zyprexa for reasons that are not clear. This should be discussed with family or the patient and another time she also takes Zoloft #6 protein calorie malnutrition. Level in the hospital was 2.8 on 5/24 #7 her discharge hemoglobin was 8.2. She was transfused one unit after this result became known and started on iron #8 she has a history of coronary artery disease status  post CABG. This appears to be stable at the bedside. She is on metoprolol for hypertension, ASA #9hyperlipidemia on atorvastatin  I will see any major issues here at present. It would be useful to have more information about her psychiatric status. Also a cognitive screen if available. She will need follow-up arranged of her abdominal aortic aneurysm Dexa scan if not already done   Extremities; left hip incision is very clean and well approximated no infection

## 2016-10-04 NOTE — Addendum Note (Signed)
Addendum  created 10/04/16 1153 by Adeliz Tonkinson D, MD   Sign clinical note    

## 2016-10-04 NOTE — Anesthesia Postprocedure Evaluation (Signed)
Anesthesia Post Note  Patient: Tonya Payne  Procedure(s) Performed: Procedure(s) (LRB): CANNULATED HIP PINNING LEFT HIP (Left)     Anesthesia Post Evaluation  Last Vitals:  Vitals:   09/26/16 1332 09/26/16 2135  BP: 114/67 (!) 157/84  Pulse: 88 98  Resp: 18 18  Temp: 37.1 C 36.8 C    Last Pain:  Vitals:   09/26/16 2135  TempSrc: Oral  PainSc:                  Shelton SilvasKevin D Dixie Coppa

## 2016-10-22 ENCOUNTER — Ambulatory Visit: Payer: Medicare Other | Admitting: Cardiology

## 2016-11-12 ENCOUNTER — Observation Stay (HOSPITAL_COMMUNITY): Payer: Medicare Other

## 2016-11-12 ENCOUNTER — Inpatient Hospital Stay (HOSPITAL_COMMUNITY)
Admission: EM | Admit: 2016-11-12 | Discharge: 2016-11-16 | DRG: 640 | Disposition: A | Discharge status: Home / Self Care | Payer: Medicare Other | Attending: Internal Medicine | Admitting: Internal Medicine

## 2016-11-12 ENCOUNTER — Emergency Department (HOSPITAL_COMMUNITY): Payer: Medicare Other

## 2016-11-12 ENCOUNTER — Encounter (HOSPITAL_COMMUNITY): Payer: Self-pay | Admitting: *Deleted

## 2016-11-12 DIAGNOSIS — E43 Unspecified severe protein-calorie malnutrition: Secondary | ICD-10-CM | POA: Diagnosis present

## 2016-11-12 DIAGNOSIS — J449 Chronic obstructive pulmonary disease, unspecified: Secondary | ICD-10-CM | POA: Diagnosis present

## 2016-11-12 DIAGNOSIS — E785 Hyperlipidemia, unspecified: Secondary | ICD-10-CM | POA: Diagnosis present

## 2016-11-12 DIAGNOSIS — Z8673 Personal history of transient ischemic attack (TIA), and cerebral infarction without residual deficits: Secondary | ICD-10-CM

## 2016-11-12 DIAGNOSIS — I4891 Unspecified atrial fibrillation: Secondary | ICD-10-CM | POA: Diagnosis present

## 2016-11-12 DIAGNOSIS — I714 Abdominal aortic aneurysm, without rupture, unspecified: Secondary | ICD-10-CM

## 2016-11-12 DIAGNOSIS — E871 Hypo-osmolality and hyponatremia: Secondary | ICD-10-CM | POA: Diagnosis not present

## 2016-11-12 DIAGNOSIS — L8990 Pressure ulcer of unspecified site, unspecified stage: Secondary | ICD-10-CM | POA: Diagnosis present

## 2016-11-12 DIAGNOSIS — R110 Nausea: Secondary | ICD-10-CM

## 2016-11-12 DIAGNOSIS — I959 Hypotension, unspecified: Secondary | ICD-10-CM | POA: Diagnosis present

## 2016-11-12 DIAGNOSIS — I252 Old myocardial infarction: Secondary | ICD-10-CM

## 2016-11-12 DIAGNOSIS — N183 Chronic kidney disease, stage 3 unspecified: Secondary | ICD-10-CM | POA: Diagnosis present

## 2016-11-12 DIAGNOSIS — E860 Dehydration: Secondary | ICD-10-CM | POA: Diagnosis present

## 2016-11-12 DIAGNOSIS — F419 Anxiety disorder, unspecified: Secondary | ICD-10-CM | POA: Diagnosis present

## 2016-11-12 DIAGNOSIS — I420 Dilated cardiomyopathy: Secondary | ICD-10-CM | POA: Diagnosis present

## 2016-11-12 DIAGNOSIS — I2510 Atherosclerotic heart disease of native coronary artery without angina pectoris: Secondary | ICD-10-CM | POA: Diagnosis present

## 2016-11-12 DIAGNOSIS — Z951 Presence of aortocoronary bypass graft: Secondary | ICD-10-CM

## 2016-11-12 DIAGNOSIS — J8410 Pulmonary fibrosis, unspecified: Secondary | ICD-10-CM | POA: Diagnosis present

## 2016-11-12 DIAGNOSIS — N179 Acute kidney failure, unspecified: Secondary | ICD-10-CM

## 2016-11-12 DIAGNOSIS — Z66 Do not resuscitate: Secondary | ICD-10-CM | POA: Diagnosis present

## 2016-11-12 DIAGNOSIS — I10 Essential (primary) hypertension: Secondary | ICD-10-CM | POA: Diagnosis present

## 2016-11-12 DIAGNOSIS — Z79899 Other long term (current) drug therapy: Secondary | ICD-10-CM

## 2016-11-12 DIAGNOSIS — I129 Hypertensive chronic kidney disease with stage 1 through stage 4 chronic kidney disease, or unspecified chronic kidney disease: Secondary | ICD-10-CM | POA: Diagnosis present

## 2016-11-12 DIAGNOSIS — Z7982 Long term (current) use of aspirin: Secondary | ICD-10-CM

## 2016-11-12 DIAGNOSIS — Z87891 Personal history of nicotine dependence: Secondary | ICD-10-CM

## 2016-11-12 DIAGNOSIS — A084 Viral intestinal infection, unspecified: Secondary | ICD-10-CM | POA: Diagnosis present

## 2016-11-12 DIAGNOSIS — L89610 Pressure ulcer of right heel, unstageable: Secondary | ICD-10-CM | POA: Diagnosis present

## 2016-11-12 DIAGNOSIS — R5381 Other malaise: Secondary | ICD-10-CM

## 2016-11-12 DIAGNOSIS — F329 Major depressive disorder, single episode, unspecified: Secondary | ICD-10-CM | POA: Diagnosis present

## 2016-11-12 DIAGNOSIS — Z6822 Body mass index (BMI) 22.0-22.9, adult: Secondary | ICD-10-CM

## 2016-11-12 DIAGNOSIS — L89620 Pressure ulcer of left heel, unstageable: Secondary | ICD-10-CM | POA: Diagnosis present

## 2016-11-12 LAB — BASIC METABOLIC PANEL
ANION GAP: 12 (ref 5–15)
ANION GAP: 8 (ref 5–15)
ANION GAP: 10 (ref 5–15)
BUN: 13 mg/dL (ref 6–20)
BUN: 14 mg/dL (ref 6–20)
BUN: 16 mg/dL (ref 6–20)
CALCIUM: 8.9 mg/dL (ref 8.9–10.3)
CALCIUM: 9 mg/dL (ref 8.9–10.3)
CALCIUM: 8.9 mg/dL (ref 8.9–10.3)
CO2: 23 mmol/L (ref 22–32)
CO2: 26 mmol/L (ref 22–32)
CO2: 24 mmol/L (ref 22–32)
CREATININE: 0.94 mg/dL (ref 0.44–1.00)
CREATININE: 1.21 mg/dL — AB (ref 0.44–1.00)
CREATININE: 1.16 mg/dL — AB (ref 0.44–1.00)
Chloride: 93 mmol/L — ABNORMAL LOW (ref 101–111)
Chloride: 94 mmol/L — ABNORMAL LOW (ref 101–111)
Chloride: 94 mmol/L — ABNORMAL LOW (ref 101–111)
GFR, EST AFRICAN AMERICAN: 48 mL/min — AB (ref >60)
GFR, EST AFRICAN AMERICAN: 50 mL/min — AB (ref >60)
GFR, EST NON AFRICAN AMERICAN: 56 mL/min — AB (ref >60)
GFR, EST NON AFRICAN AMERICAN: 41 mL/min — AB (ref >60)
GFR, EST NON AFRICAN AMERICAN: 43 mL/min — AB (ref >60)
Glucose, Bld: 95 mg/dL (ref 65–99)
Glucose, Bld: 132 mg/dL — ABNORMAL HIGH (ref 65–99)
Glucose, Bld: 97 mg/dL (ref 65–99)
Potassium: 4.6 mmol/L (ref 3.5–5.1)
Potassium: 3.4 mmol/L — ABNORMAL LOW (ref 3.5–5.1)
Potassium: 3.5 mmol/L (ref 3.5–5.1)
SODIUM: 128 mmol/L — AB (ref 135–145)
SODIUM: 128 mmol/L — AB (ref 135–145)
SODIUM: 128 mmol/L — AB (ref 135–145)

## 2016-11-12 LAB — OSMOLALITY: Osmolality: 263 mOsm/kg — ABNORMAL LOW (ref 275–295)

## 2016-11-12 LAB — HEPATIC FUNCTION PANEL
ALT: 20 U/L (ref 14–54)
AST: 40 U/L (ref 15–41)
Albumin: 3.8 g/dL (ref 3.5–5.0)
Alkaline Phosphatase: 105 U/L (ref 38–126)
BILIRUBIN DIRECT: 0.3 mg/dL (ref 0.1–0.5)
BILIRUBIN INDIRECT: 0.6 mg/dL (ref 0.3–0.9)
TOTAL PROTEIN: 7.3 g/dL (ref 6.5–8.1)
Total Bilirubin: 0.9 mg/dL (ref 0.3–1.2)

## 2016-11-12 LAB — COMPREHENSIVE METABOLIC PANEL
ALT: 19 U/L (ref 14–54)
AST: 38 U/L (ref 15–41)
Albumin: 4.4 g/dL (ref 3.5–5.0)
Alkaline Phosphatase: 113 U/L (ref 38–126)
Anion gap: 11 (ref 5–15)
BUN: 14 mg/dL (ref 6–20)
CALCIUM: 9.3 mg/dL (ref 8.9–10.3)
CO2: 25 mmol/L (ref 22–32)
CREATININE: 1.06 mg/dL — AB (ref 0.44–1.00)
Chloride: 88 mmol/L — ABNORMAL LOW (ref 101–111)
GFR, EST AFRICAN AMERICAN: 56 mL/min — AB (ref >60)
GFR, EST NON AFRICAN AMERICAN: 48 mL/min — AB (ref >60)
Glucose, Bld: 104 mg/dL — ABNORMAL HIGH (ref 65–99)
Potassium: 4.3 mmol/L (ref 3.5–5.1)
Sodium: 124 mmol/L — ABNORMAL LOW (ref 135–145)
Total Bilirubin: 1 mg/dL (ref 0.3–1.2)
Total Protein: 7.9 g/dL (ref 6.5–8.1)

## 2016-11-12 LAB — CBC WITH DIFFERENTIAL/PLATELET
Basophils Absolute: 0 10*3/uL (ref 0.0–0.1)
Basophils Relative: 0 %
Eosinophils Absolute: 0 10*3/uL (ref 0.0–0.7)
Eosinophils Relative: 0 %
HCT: 36.6 % (ref 36.0–46.0)
Hemoglobin: 12.8 g/dL (ref 12.0–15.0)
LYMPHS ABS: 1.1 10*3/uL (ref 0.7–4.0)
LYMPHS PCT: 11 %
MCH: 31.5 pg (ref 26.0–34.0)
MCHC: 35 g/dL (ref 30.0–36.0)
MCV: 90.1 fL (ref 78.0–100.0)
MONO ABS: 1.1 10*3/uL — AB (ref 0.1–1.0)
MONOS PCT: 11 %
Neutro Abs: 8.2 10*3/uL — ABNORMAL HIGH (ref 1.7–7.7)
Neutrophils Relative %: 78 %
Platelets: 280 10*3/uL (ref 150–400)
RBC: 4.06 MIL/uL (ref 3.87–5.11)
RDW: 13.4 % (ref 11.5–15.5)
WBC: 10.5 10*3/uL (ref 4.0–10.5)

## 2016-11-12 LAB — PHOSPHORUS: Phosphorus: 2.6 mg/dL (ref 2.5–4.6)

## 2016-11-12 LAB — MAGNESIUM: MAGNESIUM: 1.9 mg/dL (ref 1.7–2.4)

## 2016-11-12 LAB — URINALYSIS, ROUTINE W REFLEX MICROSCOPIC
Bilirubin Urine: NEGATIVE
GLUCOSE, UA: NEGATIVE mg/dL
Hgb urine dipstick: NEGATIVE
KETONES UR: NEGATIVE mg/dL
Leukocytes, UA: NEGATIVE
Nitrite: NEGATIVE
PROTEIN: NEGATIVE mg/dL
Specific Gravity, Urine: 1.004 — ABNORMAL LOW (ref 1.005–1.030)
pH: 6 (ref 5.0–8.0)

## 2016-11-12 LAB — ETHANOL

## 2016-11-12 LAB — NA AND K (SODIUM & POTASSIUM), RAND UR
Potassium Urine: 13 mmol/L
SODIUM UR: 36 mmol/L

## 2016-11-12 LAB — CREATININE, URINE, RANDOM: Creatinine, Urine: 30.89 mg/dL

## 2016-11-12 LAB — OSMOLALITY, URINE: OSMOLALITY UR: 153 mosm/kg — AB (ref 300–900)

## 2016-11-12 LAB — CORTISOL: CORTISOL PLASMA: 11.2 ug/dL

## 2016-11-12 MED ORDER — ATORVASTATIN CALCIUM 40 MG PO TABS
40.0000 mg | ORAL_TABLET | Freq: Every day | ORAL | Status: DC
  Administered 2016-11-12 – 2016-11-15 (×4): 40 mg via ORAL
  Filled 2016-11-12 (×4): qty 1

## 2016-11-12 MED ORDER — SODIUM CHLORIDE 0.9% FLUSH
3.0000 mL | Freq: Two times a day (BID) | INTRAVENOUS | Status: DC
  Administered 2016-11-13 – 2016-11-16 (×5): 3 mL via INTRAVENOUS

## 2016-11-12 MED ORDER — ONDANSETRON HCL 4 MG PO TABS: 4.0000 mg | ORAL_TABLET | Freq: Four times a day (QID) | ORAL | Status: DC | PRN

## 2016-11-12 MED ORDER — SODIUM CHLORIDE 0.9 % IV SOLN: 250.0000 mL | INTRAVENOUS | Status: DC | PRN

## 2016-11-12 MED ORDER — ENOXAPARIN SODIUM 40 MG/0.4ML ~~LOC~~ SOLN
40.0000 mg | SUBCUTANEOUS | Status: DC
  Administered 2016-11-12: 40 mg via SUBCUTANEOUS
  Filled 2016-11-12: qty 0.4

## 2016-11-12 MED ORDER — ENSURE ENLIVE PO LIQD
237.0000 mL | Freq: Every day | ORAL | Status: DC
  Administered 2016-11-12 – 2016-11-16 (×4): 237 mL via ORAL

## 2016-11-12 MED ORDER — SODIUM CHLORIDE 0.9% FLUSH
3.0000 mL | Freq: Two times a day (BID) | INTRAVENOUS | Status: DC
  Administered 2016-11-12: 3 mL via INTRAVENOUS

## 2016-11-12 MED ORDER — ONDANSETRON HCL 4 MG/2ML IJ SOLN: 4.0000 mg | Freq: Four times a day (QID) | INTRAMUSCULAR | Status: DC | PRN

## 2016-11-12 MED ORDER — ACETAMINOPHEN 325 MG PO TABS
650.0000 mg | ORAL_TABLET | Freq: Four times a day (QID) | ORAL | Status: DC | PRN
  Administered 2016-11-15: 650 mg via ORAL
  Filled 2016-11-12: qty 2

## 2016-11-12 MED ORDER — OLANZAPINE 5 MG PO TABS
5.0000 mg | ORAL_TABLET | Freq: Every evening | ORAL | Status: DC
  Administered 2016-11-12 – 2016-11-15 (×4): 5 mg via ORAL
  Filled 2016-11-12 (×4): qty 1

## 2016-11-12 MED ORDER — TRAMADOL HCL 50 MG PO TABS
50.0000 mg | ORAL_TABLET | Freq: Four times a day (QID) | ORAL | Status: DC | PRN
  Administered 2016-11-12 – 2016-11-15 (×4): 50 mg via ORAL
  Filled 2016-11-12 (×5): qty 1

## 2016-11-12 MED ORDER — FOLIC ACID 1 MG PO TABS
1.0000 mg | ORAL_TABLET | Freq: Every day | ORAL | Status: DC
  Administered 2016-11-12 – 2016-11-16 (×5): 1 mg via ORAL
  Filled 2016-11-12 (×5): qty 1

## 2016-11-12 MED ORDER — LORATADINE 10 MG PO TABS
10.0000 mg | ORAL_TABLET | Freq: Every day | ORAL | Status: DC
  Administered 2016-11-12 – 2016-11-15 (×4): 10 mg via ORAL
  Filled 2016-11-12 (×4): qty 1

## 2016-11-12 MED ORDER — SODIUM CHLORIDE 0.9 % IV BOLUS (SEPSIS)
500.0000 mL | Freq: Once | INTRAVENOUS | Status: AC
  Administered 2016-11-12: 500 mL via INTRAVENOUS

## 2016-11-12 MED ORDER — ACETAMINOPHEN 650 MG RE SUPP: 650.0000 mg | Freq: Four times a day (QID) | RECTAL | Status: DC | PRN

## 2016-11-12 MED ORDER — SODIUM CHLORIDE 0.9% FLUSH: 3.0000 mL | INTRAVENOUS | Status: DC | PRN

## 2016-11-12 MED ORDER — VITAMIN B-1 100 MG PO TABS
100.0000 mg | ORAL_TABLET | Freq: Every day | ORAL | Status: DC
  Administered 2016-11-12 – 2016-11-16 (×5): 100 mg via ORAL
  Filled 2016-11-12 (×5): qty 1

## 2016-11-12 MED ORDER — POLYETHYLENE GLYCOL 3350 17 G PO PACK: 17.0000 g | PACK | Freq: Every day | ORAL | Status: DC | PRN

## 2016-11-12 MED ORDER — ASPIRIN EC 81 MG PO TBEC
81.0000 mg | DELAYED_RELEASE_TABLET | Freq: Every day | ORAL | Status: DC
  Administered 2016-11-13 – 2016-11-16 (×4): 81 mg via ORAL
  Filled 2016-11-12 (×4): qty 1

## 2016-11-12 NOTE — Progress Notes (Signed)
CRITICAL VALUE ALERT  Critical Value:  Abd Xray result- 6.5cm AAA  Date & Time Notied:  11/12/16  Provider Notified: K.Kirby-NP  Orders Received/Actions taken: No new order

## 2016-11-12 NOTE — ED Provider Notes (Signed)
[facility]-EMERGENCY DEPT Provider Note   CSN: [id number] Arrival date & time: 11/12/16  1022     History   Chief Complaint Chief Complaint  Patient presents with  . Dizziness    HPI Tonya Payne is a 82 y.o. female.  HPI Patient presents with lightheadedness and dizziness. Began a couple days ago. Has had decreased oral intake. Has had some nausea and vomiting 2 days ago. Seen in urgent care yesterday and had labs drawn. Had mildly elevated white count and likely dehydration. No fevers. No chest pain. She did however have a headache for the last few days also. Reportedly fell and hit her head falling out of bed. States a headache come first. Also has some mid neck pain. No numbness or weakness. No abdominal pain. Past Medical History:  Diagnosis Date  . AAA (abdominal aortic aneurysm) without rupture (HCC) 2016   CTA 10/23/2015 - saccular infrarenal aneurysm roughly 4.8 cm in diameter. Stable from 2016 -- Dr. Darrick Penna  . Anxiety   . Atrial fibrillation, new onset (HCC) 10/2015   after CABG, in SR at d/c  . CAD (coronary artery disease), native coronary artery 10/08/2015   Severe coronary disease with calcified left main and LAD: 5% left main going into ostial LAD. Mid LAD 95% (treated with PTCA). Proximal RCA 50%, calcified. EF was 20-25% with apical akinesis and distal anterior hypokinesis. --> Referred for CABG  . Congestive dilated cardiomyopathy (HCC) 10/08/2015   Intra-Op TEE showed EF of 40 and 45%. She had an echo with a "normal LV function "documented, but there was no reading M.D.  . COPD (chronic obstructive pulmonary disease) (HCC)   . Depression   . History of acute anterior wall MI 10/08/2015   Presented with severe chest pain and dynamic anterior ST elevations/biphasic elevations area did not meet full criteria for STEMI, and troponin was not dramatically elevated. 95% LAD treated with PTCA followed by CABG  . Hyperlipidemia LDL goal <70 10/08/2015  . IBS (irritable bowel  syndrome)   . Oral thrush   . Post PTCA 10/08/2015   Presented with STEMI, severe mid LAD lesion treated with PTCA and then referred for CABG.  . TIA (transient ischemic attack)     Patient Active Problem List   Diagnosis Date Noted  . CKD (chronic kidney disease), stage III 09/24/2016  . Closed left hip fracture, with routine healing, subsequent encounter 09/23/2016  . Hyponatremia 09/23/2016  . Hypertension 09/23/2016  . Left displaced femoral neck fracture (HCC)   . Closed fracture of multiple pubic rami, right, sequela 02/27/2016  . Displaced fracture of neck of right second metacarpal bone with routine healing 02/27/2016  . Protein-calorie malnutrition, severe (HCC) 02/22/2016  . Orthostatic dizziness 02/22/2016  . Postoperative atrial fibrillation (HCC) 02/22/2016  . Fall   . Hand fracture   . Pelvic fracture (HCC) 01/26/2016  . AAA (abdominal aortic aneurysm) without rupture (HCC) 11/30/2015  . Post PTCA   . S/P CABG x 3 10/16/2015  . Coronary artery disease involving native coronary artery with angina pectoris (HCC) 10/08/2015  . Congestive dilated cardiomyopathy (HCC) 10/08/2015  . Hyperlipidemia LDL goal <70 10/08/2015  . UTI (urinary tract infection) 10/06/2015  . Myocardial infarction involving left anterior descending (LAD) coronary artery (HCC) 10/06/2015    Past Surgical History:  Procedure Laterality Date  . BACK SURGERY    . CARDIAC CATHETERIZATION N/A 10/06/2015   Procedure: Left Heart Cath and Coronary Angiography;  Surgeon: Marykay Lex, MD;  Location: [facility] INVASIVE  CV LAB;  Service: Cardiovascular;: Heavily calcified left main-LAD. 75% LM & Ost LAD, 95% d-mLAD, p-mRCA Calcified 50%. EF ~20-25%  . CARDIAC CATHETERIZATION N/A 10/06/2015   Procedure: Coronary Balloon Angioplasty;  Surgeon: Marykay Lexavid W Harding, MD;  [facility];  Service: Cardiovascular: PTCA of 95% LAD in setting of STEMI - reduced to 70% --> would need Atherectomy-PCI vs. CABG.  Sent  for CABG.  . COLECTOMY    . CORONARY ARTERY BYPASS GRAFT N/A 10/16/2015   Procedure: CORONARY ARTERY BYPASS GRAFTING (CABG)  x three, using left internal mammary artery and right leg greater saphenous vein harvested endoscopically;  Surgeon: Loreli SlotSteven C Hendrickson, MD;  [facility];  Service: Open Heart Surgery;  Laterality: N/A;  . HIP PINNING,CANNULATED Left 09/24/2016   Procedure: CANNULATED HIP PINNING LEFT HIP;  Surgeon: Durene Romanslin, Matthew, MD;  Location: [facility] ORS;  Service: Orthopedics;  Laterality: Left;  . NECK SURGERY    . TEE WITHOUT CARDIOVERSION N/A 10/16/2015   Procedure: TRANSESOPHAGEAL ECHOCARDIOGRAM (TEE);  Surgeon: Loreli SlotSteven C Hendrickson, MD;  [facility];  Service: Open Heart Surgery - IntraOp:  EF 40-45%.    OB History    No data available       Home Medications    Prior to Admission medications   Medication Sig Start Date End Date Taking? Authorizing Provider  aspirin EC 81 MG tablet Take 81 mg by mouth daily.   Yes [provider]  atorvastatin (LIPITOR) 40 MG tablet Take 1 tablet (40 mg total) by mouth daily at 6 PM. Patient taking differently: Take 40 mg by mouth daily.  12/18/15  Yes Barrett, Erin R, PA-C  feeding supplement, ENSURE ENLIVE, (ENSURE ENLIVE) LIQD Take 237 mLs by mouth 2 (two) times daily between meals. Patient taking differently: Take 237 mLs by mouth daily.  09/26/16  Yes Sheikh, Omair Latif, DO  fexofenadine (ALLEGRA) 180 MG tablet Take 180 mg by mouth every evening.    Yes [provider]  metoprolol tartrate (LOPRESSOR) 25 MG tablet Take 12.5 mg by mouth 2 (two) times daily. 07/03/16  Yes [provider]  Multiple Vitamins-Minerals (MULTIVITAMIN ADULTS PO) Take 1 tablet by mouth daily.    Yes [provider]  OLANZapine (ZYPREXA) 5 MG tablet Take 5 mg by mouth every evening.  04/26/16  Yes [provider]  sertraline (ZOLOFT) 50 MG tablet Take 50 mg by mouth every morning.    Yes [provider]    traMADol (ULTRAM) 50 MG tablet Take 50 mg by mouth every 6 (six) hours as needed for moderate pain.   Yes [provider]  aspirin 81 MG chewable tablet Chew 1 tablet (81 mg total) by mouth 2 (two) times daily. Take for 4 weeks. Patient not taking: Reported on 11/12/2016 09/24/16   Lanney GinsBabish, Matthew, PA-C  docusate sodium (COLACE) 100 MG capsule Take 1 capsule (100 mg total) by mouth 2 (two) times daily. Patient not taking: Reported on 11/12/2016 09/24/16   Lanney GinsBabish, Matthew, PA-C  ferrous sulfate (FERROUSUL) 325 (65 FE) MG tablet Take 1 tablet (325 mg total) by mouth 3 (three) times daily with meals. Patient not taking: Reported on 11/12/2016 09/24/16   Lanney GinsBabish, Matthew, PA-C  HYDROcodone-acetaminophen (NORCO) 7.5-325 MG tablet Take 1-2 tablets by mouth every 4 (four) hours as needed for moderate pain or severe pain. Patient not taking: Reported on 11/12/2016 09/24/16   Lanney GinsBabish, Matthew, PA-C  methocarbamol (ROBAXIN) 500 MG tablet Take 1 tablet (500 mg total) by mouth every 6 (  six) hours as needed for muscle spasms. Patient not taking: Reported on 11/12/2016 09/24/16   Lanney Gins, PA-C  polyethylene glycol (MIRALAX / GLYCOLAX) packet Take 17 g by mouth 2 (two) times daily. Patient not taking: Reported on 11/12/2016 09/24/16   Lanney Gins, PA-C    Family History Family History  Problem Relation Age of Onset  . Stroke Mother        dead  . Clotting disorder Father        dead  bone marrow disease    Social History Social History  Substance Use Topics  . Smoking status: Former Smoker    Packs/day: 0.50    Types: Cigarettes    Quit date: 04/11/2015  . Smokeless tobacco: Never Used  . Alcohol use 1.2 oz/week    2 Glasses of wine per week     Allergies   Patient has no known allergies.   Review of Systems Review of Systems  Constitutional: Positive for appetite change. Negative for fever.  HENT: Negative for congestion.   Eyes: Negative for photophobia.  Respiratory:  Negative for choking.   Cardiovascular: Negative for chest pain.  Gastrointestinal: Positive for nausea and vomiting.  Genitourinary: Negative for flank pain.  Musculoskeletal: Positive for neck pain. Negative for back pain.  Neurological: Positive for dizziness and headaches. Negative for tremors and seizures.  Hematological: Negative for adenopathy.  Psychiatric/Behavioral: The patient is not nervous/anxious.      Physical Exam Updated Vital Signs BP (!) 156/96   Pulse 88   Temp 97.7 F (36.5 C) (Oral)   Resp 18   Ht 5\' 1"  (1.549 m)   Wt 53.5 kg (118 lb)   SpO2 95%   BMI 22.30 kg/m   Physical Exam  Constitutional: She appears well-developed.  HENT:  Mild tenderness to right temporal area.  Eyes: EOM are normal.  Neck:  Cervical collar in place. Mild inferior midline tenderness.  Cardiovascular: Normal rate.   Pulmonary/Chest: Effort normal.  Abdominal: Soft. There is no tenderness.  Genitourinary: Vagina normal.  Musculoskeletal: Normal range of motion.  Neurological: She is alert.  Skin: Skin is warm. Capillary refill takes less than 2 seconds.     ED Treatments / Results  Labs (all labs ordered are listed, but only abnormal results are displayed) Labs Reviewed  COMPREHENSIVE METABOLIC PANEL - Abnormal; Notable for the following:       Result Value   Sodium 124 (*)    Chloride 88 (*)    Glucose, Bld 104 (*)    Creatinine, Ser 1.06 (*)    GFR calc non Af Amer 48 (*)    GFR calc Af Amer 56 (*)    All other components within normal limits  CBC WITH DIFFERENTIAL/PLATELET - Abnormal; Notable for the following:    Neutro Abs 8.2 (*)    Monocytes Absolute 1.1 (*)    All other components within normal limits  URINALYSIS, ROUTINE W REFLEX MICROSCOPIC    EKG  EKG Interpretation  Date/Time:  Tuesday November 12 2016 11:55:25 EDT Ventricular Rate:  79 PR Interval:    QRS Duration: 74 QT Interval:  379 QTC Calculation: 435 R Axis:   41 Text Interpretation:   Sinus rhythm Nonspecific T abnormalities, lateral leads Confirmed by Rubin Payor  MD, Luismario Coston [phone]) on 11/12/2016 12:38:58 PM       Radiology Dg Chest 2 View  Result Date: 11/12/2016 CLINICAL DATA:  Weakness.  Dizziness.  The patient fell last night. EXAM: CHEST  2 VIEW COMPARISON:  09/23/2016 and chest CT dated 06/05/2016 FINDINGS: Heart size and pulmonary vascularity are normal. There are old healed right seventh and ninth rib fractures. There is chronic interstitial disease at the right lung base, accentuated since the prior chest x-ray. There is also scarring in the right midzone which is stable. There is slight scarring at the left lung base laterally. No acute bone abnormality.  CABG.  Aortic atherosclerosis. IMPRESSION: 1. No acute abnormality. 2. Chronic interstitial lung disease. 3. Aortic atherosclerosis. Electronically Signed   By: Francene Boyers M.D.   On: 11/12/2016 11:15   Ct Head Wo Contrast  Result Date: 11/12/2016 CLINICAL DATA:  Weakness. Seen in the ER for abnormal labs. Fall last night with midline neck pain. Initial encounter. EXAM: CT HEAD WITHOUT CONTRAST CT CERVICAL SPINE WITHOUT CONTRAST TECHNIQUE: Multidetector CT imaging of the head and cervical spine was performed following the standard protocol without intravenous contrast. Multiplanar CT image reconstructions of the cervical spine were also generated. COMPARISON:  09/23/2016 FINDINGS: CT HEAD FINDINGS Brain: Remote lateral lenticulostriate infarct on the left primarily affecting the caudate head and extreme anterior limb of the internal capsule. Remote small infarct in the right occipital cortex. Patchy microvascular ischemic gliosis in the cerebral white matter. No acute infarct, hemorrhage, or hydrocephalus. No masslike findings. Vascular: Atherosclerotic calcification.  No hyperdense vessel. Skull: Negative for fracture. Sinuses/Orbits: No acute finding.  Left cataract resection. CT CERVICAL SPINE FINDINGS Alignment:  Normal. Skull base and vertebrae: Negative for fracture Soft tissues and spinal canal: No prevertebral fluid or swelling. No visible canal hematoma. Cervical carotid atherosclerosis. Disc levels: C5-6 and C6-7 ACDF. C6-7 interbody arthrodesis is not clearly seen, but no signs of hardware failure. Upper chest: Centrilobular emphysema and subpleural fibrosis. IMPRESSION: 1. No evidence of intracranial or cervical spine injury. 2. Stable chronic cerebral ischemic injury as described above. 3. Emphysema. Electronically Signed   By: Marnee Spring M.D.   On: 11/12/2016 12:22   Ct Cervical Spine Wo Contrast  Result Date: 11/12/2016 CLINICAL DATA:  Weakness. Seen in the ER for abnormal labs. Fall last night with midline neck pain. Initial encounter. EXAM: CT HEAD WITHOUT CONTRAST CT CERVICAL SPINE WITHOUT CONTRAST TECHNIQUE: Multidetector CT imaging of the head and cervical spine was performed following the standard protocol without intravenous contrast. Multiplanar CT image reconstructions of the cervical spine were also generated. COMPARISON:  09/23/2016 FINDINGS: CT HEAD FINDINGS Brain: Remote lateral lenticulostriate infarct on the left primarily affecting the caudate head and extreme anterior limb of the internal capsule. Remote small infarct in the right occipital cortex. Patchy microvascular ischemic gliosis in the cerebral white matter. No acute infarct, hemorrhage, or hydrocephalus. No masslike findings. Vascular: Atherosclerotic calcification.  No hyperdense vessel. Skull: Negative for fracture. Sinuses/Orbits: No acute finding.  Left cataract resection. CT CERVICAL SPINE FINDINGS Alignment: Normal. Skull base and vertebrae: Negative for fracture Soft tissues and spinal canal: No prevertebral fluid or swelling. No visible canal hematoma. Cervical carotid atherosclerosis. Disc levels: C5-6 and C6-7 ACDF. C6-7 interbody arthrodesis is not clearly seen, but no signs of hardware failure. Upper chest:  Centrilobular emphysema and subpleural fibrosis. IMPRESSION: 1. No evidence of intracranial or cervical spine injury. 2. Stable chronic cerebral ischemic injury as described above. 3. Emphysema. Electronically Signed   By: Marnee Spring M.D.   On: 11/12/2016 12:22    Procedures Procedures (including critical care time)  Medications Ordered in ED Medications  sodium chloride 0.9 % bolus 500 mL (not administered)     Initial Impression /  Assessment and Plan / ED Course  I have reviewed the triage vital signs and the nursing notes.  Pertinent labs & imaging results that were available during my care of the patient were reviewed by me and considered in my medical decision making (see chart for details).     Patient with generalized weakness with some dizziness. Has hyponatremia. Generalized weakness. Has had nausea and vomiting. I think with the hyponatremia she would benefit from admission for hydration and continued monitoring.  Final Clinical Impressions(s) / ED Diagnoses   Final diagnoses:  Hyponatremia  Dehydration    New Prescriptions New Prescriptions   No medications on file     Benjiman Core, MD 11/12/16 1241

## 2016-11-12 NOTE — Progress Notes (Signed)
RN paged NP secondary to KUB showing 6.5 cm AAA. NP reviewed chart and "care everywhere". KUB from 09/24/16 showed same 6.5cm AAA. In our chart, it appears this AAA is known since 2016 and last documentation by vascular surgery said "stable". [facility] it was suggested pt have outpt f/up for this. However, it is unknown if pt actually did. In care everywhere, AAA is mentioned as a dx [facility], but do not see actual comments about tests or plan. Will report finding to attending in am.  [facility], NP [facility]

## 2016-11-12 NOTE — H&P (Signed)
[facility] History and Physical   Patient: Tonya Payne WUJ:[id number]   PCP: Rocky Morel, MD DOB: 1935-02-16   DOA: 11/12/2016   DOS: 11/12/2016   DOS: the patient was seen and examined on 11/12/2016  Patient coming from: The patient is coming from home.  Chief Complaint: dizzienss  HPI: Tonya Payne is a 82 y.o. female with Past medical history of coronary artery disease. AAA, A. fib, CABG, COPD, depression. Patient presented with complains of nausea and vomiting started 2 days ago. She also had poor appetite. Patient started developing dizziness and lightheadedness the next day and become more fatigued and lethargic. Patient was actually taken to urgent care, workup was performed and the patient was discharged back home. After reaching home the patient was called back to come to the hospital to get admitted due to low sodium. Patient of the time of my evaluation does not have any focal deficit but does continues to complain about nausea as well as dizziness. No vertigo reported. No other focal deficit reported. No abdominal pain. No constipation. No diarrhea. No chest pain or shortness of breath. Patient remained she is compliant with all medication. Patient was actually asked to drink more water which she was doing after discharge from urgent care. Yesterday the patient reportedly had a fall which was mechanical and she may have hit her head as well.  ED Course: Presented with dizziness and a fall. CT head C-spine unremarkable. A sodium was significantly low. Patient was recommended for admission.  At her baseline ambulates without support And is independent for most of her ADL; manages her medication on her own.  Review of Systems: as mentioned in the history of present illness.  All other systems reviewed and are negative.  Past Medical History:  Diagnosis Date  . AAA (abdominal aortic aneurysm) without rupture (HCC) 2016   CTA 10/23/2015 - saccular infrarenal  aneurysm roughly 4.8 cm in diameter. Stable from 2016 -- Dr. Darrick Penna  . Anxiety   . Atrial fibrillation, new onset (HCC) 10/2015   after CABG, in SR at d/c  . CAD (coronary artery disease), native coronary artery 10/08/2015   Severe coronary disease with calcified left main and LAD: 5% left main going into ostial LAD. Mid LAD 95% (treated with PTCA). Proximal RCA 50%, calcified. EF was 20-25% with apical akinesis and distal anterior hypokinesis. --> Referred for CABG  . Congestive dilated cardiomyopathy (HCC) 10/08/2015   Intra-Op TEE showed EF of 40 and 45%. She had an echo with a "normal LV function "documented, but there was no reading M.D.  . COPD (chronic obstructive pulmonary disease) (HCC)   . Depression   . History of acute anterior wall MI 10/08/2015   Presented with severe chest pain and dynamic anterior ST elevations/biphasic elevations area did not meet full criteria for STEMI, and troponin was not dramatically elevated. 95% LAD treated with PTCA followed by CABG  . Hyperlipidemia LDL goal <70 10/08/2015  . IBS (irritable bowel syndrome)   . Oral thrush   . Post PTCA 10/08/2015   Presented with STEMI, severe mid LAD lesion treated with PTCA and then referred for CABG.  . TIA (transient ischemic attack)    Past Surgical History:  Procedure Laterality Date  . BACK SURGERY    . CARDIAC CATHETERIZATION N/A 10/06/2015   Procedure: Left Heart Cath and Coronary Angiography;  Surgeon: Marykay Lex, MD;  Location: [facility] INVASIVE CV LAB;  Service: Cardiovascular;: Heavily calcified left main-LAD. 75% LM & Ost LAD, 95%  d-mLAD, p-mRCA Calcified 50%. EF ~20-25%  . CARDIAC CATHETERIZATION N/A 10/06/2015   Procedure: Coronary Balloon Angioplasty;  Surgeon: Marykay Lexavid W Harding, MD;  [facility];  Service: Cardiovascular: PTCA of 95% LAD in setting of STEMI - reduced to 70% --> would need Atherectomy-PCI vs. CABG.  Sent for CABG.  . COLECTOMY    . CORONARY ARTERY BYPASS GRAFT N/A  10/16/2015   Procedure: CORONARY ARTERY BYPASS GRAFTING (CABG)  x three, using left internal mammary artery and right leg greater saphenous vein harvested endoscopically;  Surgeon: Loreli SlotSteven C Hendrickson, MD;  [facility];  Service: Open Heart Surgery;  Laterality: N/A;  . HIP PINNING,CANNULATED Left 09/24/2016   Procedure: CANNULATED HIP PINNING LEFT HIP;  Surgeon: Durene Romanslin, Matthew, MD;  Location: [facility] ORS;  Service: Orthopedics;  Laterality: Left;  . NECK SURGERY    . TEE WITHOUT CARDIOVERSION N/A 10/16/2015   Procedure: TRANSESOPHAGEAL ECHOCARDIOGRAM (TEE);  Surgeon: Loreli SlotSteven C Hendrickson, MD;  [facility];  Service: Open Heart Surgery - IntraOp:  EF 40-45%.   Social History:  reports that she quit smoking about 19 months ago. Her smoking use included Cigarettes. She smoked 0.50 packs per day. She has never used smokeless tobacco. She reports that she drinks about 1.2 oz of alcohol per week . She reports that she does not use drugs.  No Known Allergies   Family History  Problem Relation Age of Onset  . Stroke Mother        dead  . Clotting disorder Father        dead  bone marrow disease     Prior to Admission medications   Medication Sig Start Date End Date Taking? Authorizing Provider  aspirin EC 81 MG tablet Take 81 mg by mouth daily.   Yes [provider]  atorvastatin (LIPITOR) 40 MG tablet Take 1 tablet (40 mg total) by mouth daily at 6 PM. Patient taking differently: Take 40 mg by mouth daily.  12/18/15  Yes Barrett, Erin R, PA-C  feeding supplement, ENSURE ENLIVE, (ENSURE ENLIVE) LIQD Take 237 mLs by mouth 2 (two) times daily between meals. Patient taking differently: Take 237 mLs by mouth daily.  09/26/16  Yes Sheikh, Omair Latif, DO  fexofenadine (ALLEGRA) 180 MG tablet Take 180 mg by mouth every evening.    Yes [provider]  metoprolol tartrate (LOPRESSOR) 25 MG tablet Take 12.5 mg by mouth 2 (two) times daily. 07/03/16  Yes [provider]    Multiple Vitamins-Minerals (MULTIVITAMIN ADULTS PO) Take 1 tablet by mouth daily.    Yes [provider]  OLANZapine (ZYPREXA) 5 MG tablet Take 5 mg by mouth every evening.  04/26/16  Yes [provider]  sertraline (ZOLOFT) 50 MG tablet Take 50 mg by mouth every morning.    Yes [provider]  traMADol (ULTRAM) 50 MG tablet Take 50 mg by mouth every 6 (six) hours as needed for moderate pain.   Yes [provider]  aspirin 81 MG chewable tablet Chew 1 tablet (81 mg total) by mouth 2 (two) times daily. Take for 4 weeks. Patient not taking: Reported on 11/12/2016 09/24/16   Lanney GinsBabish, Matthew, PA-C  docusate sodium (COLACE) 100 MG capsule Take 1 capsule (100 mg total) by mouth 2 (two) times daily. Patient not taking: Reported on 11/12/2016 09/24/16   Lanney GinsBabish, Matthew, PA-C  ferrous sulfate (FERROUSUL) 325 (65 FE) MG tablet Take 1 tablet (325 mg total) by mouth 3 (three) times daily with meals. Patient  not taking: Reported on 11/12/2016 09/24/16   Lanney Gins, PA-C  HYDROcodone-acetaminophen (NORCO) 7.5-325 MG tablet Take 1-2 tablets by mouth every 4 (four) hours as needed for moderate pain or severe pain. Patient not taking: Reported on 11/12/2016 09/24/16   Lanney Gins, PA-C  methocarbamol (ROBAXIN) 500 MG tablet Take 1 tablet (500 mg total) by mouth every 6 (six) hours as needed for muscle spasms. Patient not taking: Reported on 11/12/2016 09/24/16   Lanney Gins, PA-C  polyethylene glycol (MIRALAX / GLYCOLAX) packet Take 17 g by mouth 2 (two) times daily. Patient not taking: Reported on 11/12/2016 09/24/16   Lanney Gins, PA-C    Physical Exam: Vitals:   11/12/16 1130 11/12/16 1252 11/12/16 1255 11/12/16 1500  BP: (!) 163/99 (!) 175/93 (!) 175/93 137/76  Pulse: 81 75 81 79  Resp:  18 15 17   Temp:  97.8 F (36.6 C)  99 F (37.2 C)  TempSrc:  Oral  Oral  SpO2: 96% 98% 97% 98%  Weight:      Height:        General: Alert, Awake and Oriented to Time,  Place and Person. Appear in mild distress, affect flat Eyes: PERRL, Conjunctiva normal ENT: Oral Mucosa clear moist. Neck: no JVD, no Abnormal Mass Or lumps Cardiovascular: S1 and S2 Present, no Murmur, Peripheral Pulses Present Respiratory: normal respiratory effort, Bilateral Air entry equal and Decreased, no use of accessory muscle, Clear to Auscultation, no Crackles, no wheezes Abdomen: Bowel Sound present, Soft and no tenderness, no hernia Skin: no redness, no Rash, no induration Extremities: no Pedal edema, no calf tenderness Neurologic: Grossly no focal neuro deficit. Bilaterally Equal motor strength  Labs on Admission:  CBC:  Recent Labs Lab 11/12/16 1145  WBC 10.5  NEUTROABS 8.2*  HGB 12.8  HCT 36.6  MCV 90.1  PLT 280   Basic Metabolic Panel:  Recent Labs Lab 11/12/16 1145 11/12/16 1606  NA 124* 128*  K 4.3 4.6  CL 88* 93*  CO2 25 23  GLUCOSE 104* 95  BUN 14 13  CREATININE 1.06* 0.94  CALCIUM 9.3 8.9  MG  --  1.9  PHOS  --  2.6   GFR: Estimated Creatinine Clearance: 36 mL/min (by C-G formula based on SCr of 0.94 mg/dL). Liver Function Tests:  Recent Labs Lab 11/12/16 1145 11/12/16 1606  AST 38 40  ALT 19 20  ALKPHOS 113 105  BILITOT 1.0 0.9  PROT 7.9 7.3  ALBUMIN 4.4 3.8   No results for input(s): LIPASE, AMYLASE in the last 168 hours. No results for input(s): AMMONIA in the last 168 hours. Coagulation Profile: No results for input(s): INR, PROTIME in the last 168 hours. Cardiac Enzymes: No results for input(s): CKTOTAL, CKMB, CKMBINDEX, TROPONINI in the last 168 hours. BNP (last 3 results) No results for input(s): PROBNP in the last 8760 hours. HbA1C: No results for input(s): HGBA1C in the last 72 hours. CBG: No results for input(s): GLUCAP in the last 168 hours. Lipid Profile: No results for input(s): CHOL, HDL, LDLCALC, TRIG, CHOLHDL, LDLDIRECT in the last 72 hours. Thyroid Function Tests: No results for input(s): TSH, T4TOTAL,  FREET4, T3FREE, THYROIDAB in the last 72 hours. Anemia Panel: No results for input(s): VITAMINB12, FOLATE, FERRITIN, TIBC, IRON, RETICCTPCT in the last 72 hours. Urine analysis:    Component Value Date/Time   COLORURINE STRAW (A) 11/12/2016 1220   APPEARANCEUR CLEAR 11/12/2016 1220   LABSPEC 1.004 (L) 11/12/2016 1220   PHURINE 6.0 11/12/2016 1220   GLUCOSEU  NEGATIVE 11/12/2016 1220   HGBUR NEGATIVE 11/12/2016 1220   BILIRUBINUR NEGATIVE 11/12/2016 1220   KETONESUR NEGATIVE 11/12/2016 1220   PROTEINUR NEGATIVE 11/12/2016 1220   UROBILINOGEN 0.2 02/25/2015 1826   NITRITE NEGATIVE 11/12/2016 1220   LEUKOCYTESUR NEGATIVE 11/12/2016 1220    Radiological Exams on Admission: Dg Chest 2 View  Result Date: 11/12/2016 CLINICAL DATA:  Weakness.  Dizziness.  The patient fell last night. EXAM: CHEST  2 VIEW COMPARISON:  09/23/2016 and chest CT dated 06/05/2016 FINDINGS: Heart size and pulmonary vascularity are normal. There are old healed right seventh and ninth rib fractures. There is chronic interstitial disease at the right lung base, accentuated since the prior chest x-ray. There is also scarring in the right midzone which is stable. There is slight scarring at the left lung base laterally. No acute bone abnormality.  CABG.  Aortic atherosclerosis. IMPRESSION: 1. No acute abnormality. 2. Chronic interstitial lung disease. 3. Aortic atherosclerosis. Electronically Signed   By: Francene Boyers M.D.   On: 11/12/2016 11:15   Ct Head Wo Contrast  Result Date: 11/12/2016 CLINICAL DATA:  Weakness. Seen in the ER for abnormal labs. Fall last night with midline neck pain. Initial encounter. EXAM: CT HEAD WITHOUT CONTRAST CT CERVICAL SPINE WITHOUT CONTRAST TECHNIQUE: Multidetector CT imaging of the head and cervical spine was performed following the standard protocol without intravenous contrast. Multiplanar CT image reconstructions of the cervical spine were also generated. COMPARISON:  09/23/2016 FINDINGS:  CT HEAD FINDINGS Brain: Remote lateral lenticulostriate infarct on the left primarily affecting the caudate head and extreme anterior limb of the internal capsule. Remote small infarct in the right occipital cortex. Patchy microvascular ischemic gliosis in the cerebral white matter. No acute infarct, hemorrhage, or hydrocephalus. No masslike findings. Vascular: Atherosclerotic calcification.  No hyperdense vessel. Skull: Negative for fracture. Sinuses/Orbits: No acute finding.  Left cataract resection. CT CERVICAL SPINE FINDINGS Alignment: Normal. Skull base and vertebrae: Negative for fracture Soft tissues and spinal canal: No prevertebral fluid or swelling. No visible canal hematoma. Cervical carotid atherosclerosis. Disc levels: C5-6 and C6-7 ACDF. C6-7 interbody arthrodesis is not clearly seen, but no signs of hardware failure. Upper chest: Centrilobular emphysema and subpleural fibrosis. IMPRESSION: 1. No evidence of intracranial or cervical spine injury. 2. Stable chronic cerebral ischemic injury as described above. 3. Emphysema. Electronically Signed   By: Marnee Spring M.D.   On: 11/12/2016 12:22   Ct Cervical Spine Wo Contrast  Result Date: 11/12/2016 CLINICAL DATA:  Weakness. Seen in the ER for abnormal labs. Fall last night with midline neck pain. Initial encounter. EXAM: CT HEAD WITHOUT CONTRAST CT CERVICAL SPINE WITHOUT CONTRAST TECHNIQUE: Multidetector CT imaging of the head and cervical spine was performed following the standard protocol without intravenous contrast. Multiplanar CT image reconstructions of the cervical spine were also generated. COMPARISON:  09/23/2016 FINDINGS: CT HEAD FINDINGS Brain: Remote lateral lenticulostriate infarct on the left primarily affecting the caudate head and extreme anterior limb of the internal capsule. Remote small infarct in the right occipital cortex. Patchy microvascular ischemic gliosis in the cerebral white matter. No acute infarct, hemorrhage, or  hydrocephalus. No masslike findings. Vascular: Atherosclerotic calcification.  No hyperdense vessel. Skull: Negative for fracture. Sinuses/Orbits: No acute finding.  Left cataract resection. CT CERVICAL SPINE FINDINGS Alignment: Normal. Skull base and vertebrae: Negative for fracture Soft tissues and spinal canal: No prevertebral fluid or swelling. No visible canal hematoma. Cervical carotid atherosclerosis. Disc levels: C5-6 and C6-7 ACDF. C6-7 interbody arthrodesis is not clearly seen, but  no signs of hardware failure. Upper chest: Centrilobular emphysema and subpleural fibrosis. IMPRESSION: 1. No evidence of intracranial or cervical spine injury. 2. Stable chronic cerebral ischemic injury as described above. 3. Emphysema. Electronically Signed   By: Marnee Spring M.D.   On: 11/12/2016 12:22    Assessment/Plan 1. Hyponatremia Dizziness Fall The patient presented with complaints of dizziness and a fall as well as abnormal lab. Started with nausea and vomiting with poor by mouth intake. Worsened with dizziness. Was actually seen at urgent care. Sodium was low and therefore was referred for admission. Currently the patient appears euvolemic. I will check serum and urine osmolality, TSH, cortisol level, urine sodium, urine creatinine. Currently empirically I would place the patient on fluid restricted diet. Reportedly patient was hypotensive at home yesterday. Patient has already received normal saline bolus in the ER 500 mL. Patient is also on Zoloft which would cause dry mouth leading to hyponatremia. We'll hold that. Once the workup is available we'll get further treatment. on telemetry as well as serial neuro checks. PTOT consulted. CT head and CT cervical spine unremarkable. Neurological examination also unremarkable.  2. Coronary artery disease. Essential hypertension, hypotension. Sinus tachycardia.  Continue 81 mg aspirin, Lipitor 40 mg. Holding Lopressor started for  tachycardia.  3. Mood disorder. Patient is on Zoloft, currently I would hold it. Continue Zyprexa.  4. History of alcohol use. Patient brings 1 glass of wine on a daily basis. Do not think that the patient needs to be on CIWA protocol. Monitor.  5. Nausea and vomiting. Currently resolved. Get an x-ray abdomen.  Nutrition: Regular diet, fluid restricted. DVT Prophylaxis: subcutaneous Heparin  Advance goals of care discussion: full code   Consults: none  Family Communication: no family was present at bedside, at the time of interview.  Disposition: Admitted as observation, telemetry unit. Likely to be discharged home, in 1-2 days.  Author: Lynden Oxford, MD [facility] Pager: [phone] 11/12/2016  If 7PM-7AM, please contact night-coverage [email or web address] Password [email or web address]

## 2016-11-12 NOTE — ED Triage Notes (Signed)
Patient is alert and oriented x4.  She is from home and being seen for abnormal labs from an urgent care.  Patient also complains of dizziness that started Saturday.  Currently she only has pain in her mid line neck from a fall last night that she rates 5 of 10.

## 2016-11-12 NOTE — ED Notes (Signed)
Bed: WA07 Expected date:  Expected time:  Means of arrival:  Comments: 

## 2016-11-12 NOTE — ED Notes (Signed)
Delay in urine sample Pt is being taken to CT.

## 2016-11-13 DIAGNOSIS — I10 Essential (primary) hypertension: Secondary | ICD-10-CM | POA: Diagnosis not present

## 2016-11-13 DIAGNOSIS — E871 Hypo-osmolality and hyponatremia: Principal | ICD-10-CM

## 2016-11-13 DIAGNOSIS — I714 Abdominal aortic aneurysm, without rupture: Secondary | ICD-10-CM

## 2016-11-13 DIAGNOSIS — E860 Dehydration: Secondary | ICD-10-CM | POA: Diagnosis not present

## 2016-11-13 DIAGNOSIS — E785 Hyperlipidemia, unspecified: Secondary | ICD-10-CM

## 2016-11-13 DIAGNOSIS — R110 Nausea: Secondary | ICD-10-CM | POA: Diagnosis not present

## 2016-11-13 LAB — BASIC METABOLIC PANEL
ANION GAP: 12 (ref 5–15)
BUN: 15 mg/dL (ref 6–20)
CALCIUM: 9 mg/dL (ref 8.9–10.3)
CO2: 23 mmol/L (ref 22–32)
Chloride: 96 mmol/L — ABNORMAL LOW (ref 101–111)
Creatinine, Ser: 1.1 mg/dL — ABNORMAL HIGH (ref 0.44–1.00)
GFR, EST AFRICAN AMERICAN: 53 mL/min — AB (ref >60)
GFR, EST NON AFRICAN AMERICAN: 46 mL/min — AB (ref >60)
Glucose, Bld: 95 mg/dL (ref 65–99)
Potassium: 3.8 mmol/L (ref 3.5–5.1)
Sodium: 131 mmol/L — ABNORMAL LOW (ref 135–145)

## 2016-11-13 LAB — BASIC METABOLIC PANEL WITH GFR
Anion gap: 10 (ref 5–15)
BUN: 16 mg/dL (ref 6–20)
CO2: 24 mmol/L (ref 22–32)
Calcium: 9 mg/dL (ref 8.9–10.3)
Chloride: 96 mmol/L — ABNORMAL LOW (ref 101–111)
Creatinine, Ser: 1.2 mg/dL — ABNORMAL HIGH (ref 0.44–1.00)
GFR calc Af Amer: 48 mL/min — ABNORMAL LOW
GFR calc non Af Amer: 42 mL/min — ABNORMAL LOW
Glucose, Bld: 100 mg/dL — ABNORMAL HIGH (ref 65–99)
Potassium: 3.7 mmol/L (ref 3.5–5.1)
Sodium: 130 mmol/L — ABNORMAL LOW (ref 135–145)

## 2016-11-13 LAB — TSH: TSH: 1.527 u[IU]/mL (ref 0.350–4.500)

## 2016-11-13 LAB — MAGNESIUM: Magnesium: 2.1 mg/dL (ref 1.7–2.4)

## 2016-11-13 LAB — PROTIME-INR
INR: 1.04
PROTHROMBIN TIME: 13.6 s (ref 11.4–15.2)

## 2016-11-13 MED ORDER — METOPROLOL TARTRATE 25 MG PO TABS: 12.5000 mg | ORAL_TABLET | Freq: Two times a day (BID) | ORAL | Status: DC

## 2016-11-13 MED ORDER — FLUTICASONE PROPIONATE 50 MCG/ACT NA SUSP
1.0000 | Freq: Every day | NASAL | Status: DC
  Administered 2016-11-13 – 2016-11-16 (×4): 1 via NASAL
  Filled 2016-11-13: qty 16

## 2016-11-13 MED ORDER — SODIUM CHLORIDE 0.9 % IV SOLN: 250.0000 mL | INTRAVENOUS | Status: DC | PRN

## 2016-11-13 MED ORDER — ENOXAPARIN SODIUM 30 MG/0.3ML ~~LOC~~ SOLN
30.0000 mg | SUBCUTANEOUS | Status: DC
  Administered 2016-11-13 – 2016-11-15 (×3): 30 mg via SUBCUTANEOUS
  Filled 2016-11-13 (×3): qty 0.3

## 2016-11-13 NOTE — Care Management Note (Signed)
Case Management Note  Patient Details  Name: Tonya Payne MRN: [id number] Date of Birth: April 07, 1935  Subjective/Objective: 82 y/o f admitted w/hyponatremia. From home. ABN given-administration/MD notified.                   Action/Plan:d/c plan home.   Expected Discharge Date:   (unknown)               Expected Discharge Plan:  Home/Self Care  In-House Referral:     Discharge planning Services  CM Consult  Post Acute Care Choice:    Choice offered to:     DME Arranged:    DME Agency:     HH Arranged:    HH Agency:     Status of Service:  In process, will continue to follow  If discussed at Long Length of Stay Meetings, dates discussed:    Additional Comments:  Lanier ClamMahabir, Colson Barco, RN 11/13/2016, 4:24 PM

## 2016-11-13 NOTE — Progress Notes (Signed)
[facility] PROGRESS NOTE  Tonya Payne ZOX:[id number] DOB: 1934-09-16 DOA: 11/12/2016 PCP: Rocky Morel, MD   Interim summary and HPI  82 y.o. female with Past medical history of coronary artery disease. AAA, A. fib, CABG, COPD, depression. Patient presented with complains of nausea and vomiting started 2 days ago. She also had poor appetite. Patient started developing dizziness and lightheadedness the next day and become more fatigued and lethargic. Patient was actually taken to urgent care, workup was performed and the patient was discharged back home. After reaching home the patient was called back to come to the hospital to get admitted due to low sodium.  Assessment/Plan: 1-hyponatremia/dizziness and orthostatic changes: due to poor PO intake and dehydration -will continue IVF -continue holding antihypertensive agents -continue supportive care and follow PT/OT assessment  2-abdominal aortic aneurysm  -6.5 cm -no signs of ruptured  -present since 2016 -needs outpatient follow up with vascular surgery  3-hx of CAD -no CP -no abnormalities on telemetry -plan is to resume b-blockers when hemodynamically stable -will continue aspirin and statins  4-mood disorder -continue zyprexa -continue to hold zoloft  5-hx of alcohol use  -patient drink 1 glass of wine almost on daily basis -has never experienced signs or symptoms of withdrawal -will monitor.  6-nausea and vomiting -no acute abnormalities seen on X-ray -will continue PRN antiemetics   Code Status: DNR Family Communication: no family at bedside  Disposition Plan: patient with positive orthostatic changes and symptoms of lightheadedness. Will provide IVF's, continue holding antihypertensive drugs, and continue supportive care.   Consultants:  None   Procedures:  See below for x-ray reports   Antibiotics:  None   HPI/Subjective: Afebrile, no CP and no SOB. Found with positive orthostatic VS.  Patient also dizzy and lightheaded when changing positions.  Objective: Vitals:   11/13/16 1349 11/13/16 2049  BP: 121/70 101/61  Pulse: 99 84  Resp: 18 18  Temp: 98.1 F (36.7 C) 98.2 F (36.8 C)    Intake/Output Summary (Last 24 hours) at 11/13/16 2301 Last data filed at 11/13/16 0612  Gross per 24 hour  Intake                0 ml  Output              250 ml  Net             -250 ml   Filed Weights   11/12/16 1042 11/13/16 0621  Weight: 53.5 kg (118 lb) 50 kg (110 lb 3.7 oz)    Exam:   General: feeling dizzy and lightheaded when changing positions. No CP or SOB. Reports appetite is still poor.  Cardiovascular: S1 and S2, no rubs or gallops, positive SEM  Respiratory: good air movement, not requiring oxygen supplementation. No wheezing, no crackles  Abdomen: soft, NT, ND, positive BS  Musculoskeletal: no edema or cyanosis   Data Reviewed: Basic Metabolic Panel:  Recent Labs Lab 11/12/16 1606 11/12/16 1920 11/12/16 2308 11/13/16 0441 11/13/16 0850  NA 128* 128* 128* 130* 131*  K 4.6 3.4* 3.5 3.7 3.8  CL 93* 94* 94* 96* 96*  CO2 23 26 24 24 23   GLUCOSE 95 132* 97 100* 95  BUN 13 14 16 16 15   CREATININE 0.94 1.21* 1.16* 1.20* 1.10*  CALCIUM 8.9 9.0 8.9 9.0 9.0  MG 1.9  --   --  2.1  --   PHOS 2.6  --   --   --   --  Liver Function Tests:  Recent Labs Lab 11/12/16 1145 11/12/16 1606  AST 38 40  ALT 19 20  ALKPHOS 113 105  BILITOT 1.0 0.9  PROT 7.9 7.3  ALBUMIN 4.4 3.8   CBC:  Recent Labs Lab 11/12/16 1145  WBC 10.5  NEUTROABS 8.2*  HGB 12.8  HCT 36.6  MCV 90.1  PLT 280    Studies: Dg Chest 2 View  Result Date: 11/12/2016 CLINICAL DATA:  Weakness.  Dizziness.  The patient fell last night. EXAM: CHEST  2 VIEW COMPARISON:  09/23/2016 and chest CT dated 06/05/2016 FINDINGS: Heart size and pulmonary vascularity are normal. There are old healed right seventh and ninth rib fractures. There is chronic interstitial disease at the right  lung base, accentuated since the prior chest x-ray. There is also scarring in the right midzone which is stable. There is slight scarring at the left lung base laterally. No acute bone abnormality.  CABG.  Aortic atherosclerosis. IMPRESSION: 1. No acute abnormality. 2. Chronic interstitial lung disease. 3. Aortic atherosclerosis. Electronically Signed   By: Francene BoyersJames  Maxwell M.D.   On: 11/12/2016 11:15   Ct Head Wo Contrast  Result Date: 11/12/2016 CLINICAL DATA:  Weakness. Seen in the ER for abnormal labs. Fall last night with midline neck pain. Initial encounter. EXAM: CT HEAD WITHOUT CONTRAST CT CERVICAL SPINE WITHOUT CONTRAST TECHNIQUE: Multidetector CT imaging of the head and cervical spine was performed following the standard protocol without intravenous contrast. Multiplanar CT image reconstructions of the cervical spine were also generated. COMPARISON:  09/23/2016 FINDINGS: CT HEAD FINDINGS Brain: Remote lateral lenticulostriate infarct on the left primarily affecting the caudate head and extreme anterior limb of the internal capsule. Remote small infarct in the right occipital cortex. Patchy microvascular ischemic gliosis in the cerebral white matter. No acute infarct, hemorrhage, or hydrocephalus. No masslike findings. Vascular: Atherosclerotic calcification.  No hyperdense vessel. Skull: Negative for fracture. Sinuses/Orbits: No acute finding.  Left cataract resection. CT CERVICAL SPINE FINDINGS Alignment: Normal. Skull base and vertebrae: Negative for fracture Soft tissues and spinal canal: No prevertebral fluid or swelling. No visible canal hematoma. Cervical carotid atherosclerosis. Disc levels: C5-6 and C6-7 ACDF. C6-7 interbody arthrodesis is not clearly seen, but no signs of hardware failure. Upper chest: Centrilobular emphysema and subpleural fibrosis. IMPRESSION: 1. No evidence of intracranial or cervical spine injury. 2. Stable chronic cerebral ischemic injury as described above. 3. Emphysema.  Electronically Signed   By: Marnee SpringJonathon  Watts M.D.   On: 11/12/2016 12:22   Ct Cervical Spine Wo Contrast  Result Date: 11/12/2016 CLINICAL DATA:  Weakness. Seen in the ER for abnormal labs. Fall last night with midline neck pain. Initial encounter. EXAM: CT HEAD WITHOUT CONTRAST CT CERVICAL SPINE WITHOUT CONTRAST TECHNIQUE: Multidetector CT imaging of the head and cervical spine was performed following the standard protocol without intravenous contrast. Multiplanar CT image reconstructions of the cervical spine were also generated. COMPARISON:  09/23/2016 FINDINGS: CT HEAD FINDINGS Brain: Remote lateral lenticulostriate infarct on the left primarily affecting the caudate head and extreme anterior limb of the internal capsule. Remote small infarct in the right occipital cortex. Patchy microvascular ischemic gliosis in the cerebral white matter. No acute infarct, hemorrhage, or hydrocephalus. No masslike findings. Vascular: Atherosclerotic calcification.  No hyperdense vessel. Skull: Negative for fracture. Sinuses/Orbits: No acute finding.  Left cataract resection. CT CERVICAL SPINE FINDINGS Alignment: Normal. Skull base and vertebrae: Negative for fracture Soft tissues and spinal canal: No prevertebral fluid or swelling. No visible canal hematoma. Cervical carotid atherosclerosis.  Disc levels: C5-6 and C6-7 ACDF. C6-7 interbody arthrodesis is not clearly seen, but no signs of hardware failure. Upper chest: Centrilobular emphysema and subpleural fibrosis. IMPRESSION: 1. No evidence of intracranial or cervical spine injury. 2. Stable chronic cerebral ischemic injury as described above. 3. Emphysema. Electronically Signed   By: Marnee Spring M.D.   On: 11/12/2016 12:22   Dg Abd Portable 1v  Result Date: 11/12/2016 CLINICAL DATA:  82 year old female with weakness dizziness and back pain. Initial encounter. EXAM: PORTABLE ABDOMEN - 1 VIEW COMPARISON:  09/23/2016. FINDINGS: Large abdominal aortic aneurysm  measuring up to 6.5 cm similar to the 09/24/2016 examination. If there is any clinical suspicion of this causing patient's back pain, CT recommended. No evidence to suggest bowel obstruction. The possibility of free intraperitoneal air cannot be assessed on a supine view. Prior left hip surgery. IMPRESSION: Large abdominal aortic aneurysm measuring up to 6.5 cm similar to the 09/24/2016 examination. If there is any clinical suspicion of this causing patient's back pain, CT recommended. No evidence to suggest bowel obstruction. These results were called by telephone at the time of interpretation on 11/12/2016 at 8:22 pm to [facility], patient's nurse who verbally acknowledged these results. Request to call physician on-call. Electronically Signed   By: Lacy Duverney M.D.   On: 11/12/2016 20:25    Scheduled Meds: . aspirin EC  81 mg Oral Daily  . atorvastatin  40 mg Oral q1800  . enoxaparin (LOVENOX) injection  30 mg Subcutaneous Q24H  . feeding supplement (ENSURE ENLIVE)  237 mL Oral Daily  . fluticasone  1 spray Each Nare Daily  . folic acid  1 mg Oral Daily  . loratadine  10 mg Oral Daily  . OLANZapine  5 mg Oral QPM  . sodium chloride flush  3 mL Intravenous Q12H  . sodium chloride flush  3 mL Intravenous Q12H  . thiamine  100 mg Oral Daily   Continuous Infusions: . sodium chloride      Principal Problem:   Hyponatremia Active Problems:   Hyperlipidemia LDL goal <70   S/P CABG x 3   Protein-calorie malnutrition, severe (HCC)   Hypertension   CKD (chronic kidney disease), stage III   Pressure injury of skin    Time spent: 30 minutes    Vassie Loll  [facility] Pager [phone]. If 7PM-7AM, please contact night-coverage at [email or web address], password [facility] 11/13/2016, 11:01 PM  LOS: 0 days

## 2016-11-13 NOTE — Care Management Obs Status (Signed)
MEDICARE OBSERVATION STATUS NOTIFICATION   Patient Details  Name: Tonya Payne MRN: [id number] Date of Birth: 05/13/1934   Medicare Observation Status Notification Given:  Yes    MahabirOlegario Messier, Kambrea Carrasco, RN 11/13/2016, 4:23 PM

## 2016-11-13 NOTE — Progress Notes (Signed)
CRITICAL VALUE ALERT  Critical Value:  Serum osmolality-263  Date & Time Notied:  11/12/16 at 2130  Provider Notified: K.Kirby-NP  Orders Received/Actions taken: No new order.

## 2016-11-14 DIAGNOSIS — E440 Moderate protein-calorie malnutrition: Secondary | ICD-10-CM | POA: Diagnosis not present

## 2016-11-14 DIAGNOSIS — N183 Chronic kidney disease, stage 3 (moderate): Secondary | ICD-10-CM

## 2016-11-14 DIAGNOSIS — E871 Hypo-osmolality and hyponatremia: Secondary | ICD-10-CM | POA: Diagnosis present

## 2016-11-14 DIAGNOSIS — R5381 Other malaise: Secondary | ICD-10-CM | POA: Diagnosis not present

## 2016-11-14 DIAGNOSIS — Z951 Presence of aortocoronary bypass graft: Secondary | ICD-10-CM | POA: Diagnosis not present

## 2016-11-14 DIAGNOSIS — E785 Hyperlipidemia, unspecified: Secondary | ICD-10-CM | POA: Diagnosis present

## 2016-11-14 DIAGNOSIS — N179 Acute kidney failure, unspecified: Secondary | ICD-10-CM

## 2016-11-14 DIAGNOSIS — I2510 Atherosclerotic heart disease of native coronary artery without angina pectoris: Secondary | ICD-10-CM | POA: Diagnosis present

## 2016-11-14 DIAGNOSIS — I959 Hypotension, unspecified: Secondary | ICD-10-CM | POA: Diagnosis present

## 2016-11-14 DIAGNOSIS — F329 Major depressive disorder, single episode, unspecified: Secondary | ICD-10-CM | POA: Diagnosis present

## 2016-11-14 DIAGNOSIS — J449 Chronic obstructive pulmonary disease, unspecified: Secondary | ICD-10-CM | POA: Diagnosis present

## 2016-11-14 DIAGNOSIS — Z8673 Personal history of transient ischemic attack (TIA), and cerebral infarction without residual deficits: Secondary | ICD-10-CM | POA: Diagnosis not present

## 2016-11-14 DIAGNOSIS — I129 Hypertensive chronic kidney disease with stage 1 through stage 4 chronic kidney disease, or unspecified chronic kidney disease: Secondary | ICD-10-CM | POA: Diagnosis present

## 2016-11-14 DIAGNOSIS — F419 Anxiety disorder, unspecified: Secondary | ICD-10-CM | POA: Diagnosis present

## 2016-11-14 DIAGNOSIS — Z79899 Other long term (current) drug therapy: Secondary | ICD-10-CM | POA: Diagnosis not present

## 2016-11-14 DIAGNOSIS — I4891 Unspecified atrial fibrillation: Secondary | ICD-10-CM | POA: Diagnosis present

## 2016-11-14 DIAGNOSIS — E43 Unspecified severe protein-calorie malnutrition: Secondary | ICD-10-CM | POA: Diagnosis present

## 2016-11-14 DIAGNOSIS — E860 Dehydration: Secondary | ICD-10-CM | POA: Diagnosis present

## 2016-11-14 DIAGNOSIS — Z6822 Body mass index (BMI) 22.0-22.9, adult: Secondary | ICD-10-CM | POA: Diagnosis not present

## 2016-11-14 DIAGNOSIS — Z7982 Long term (current) use of aspirin: Secondary | ICD-10-CM | POA: Diagnosis not present

## 2016-11-14 DIAGNOSIS — I420 Dilated cardiomyopathy: Secondary | ICD-10-CM | POA: Diagnosis present

## 2016-11-14 DIAGNOSIS — I10 Essential (primary) hypertension: Secondary | ICD-10-CM | POA: Diagnosis not present

## 2016-11-14 DIAGNOSIS — A084 Viral intestinal infection, unspecified: Secondary | ICD-10-CM | POA: Diagnosis present

## 2016-11-14 DIAGNOSIS — Z87891 Personal history of nicotine dependence: Secondary | ICD-10-CM | POA: Diagnosis not present

## 2016-11-14 DIAGNOSIS — Z66 Do not resuscitate: Secondary | ICD-10-CM | POA: Diagnosis present

## 2016-11-14 DIAGNOSIS — I252 Old myocardial infarction: Secondary | ICD-10-CM | POA: Diagnosis not present

## 2016-11-14 DIAGNOSIS — I714 Abdominal aortic aneurysm, without rupture: Secondary | ICD-10-CM | POA: Diagnosis not present

## 2016-11-14 LAB — BASIC METABOLIC PANEL
Anion gap: 12 (ref 5–15)
BUN: 16 mg/dL (ref 6–20)
CHLORIDE: 97 mmol/L — AB (ref 101–111)
CO2: 23 mmol/L (ref 22–32)
CREATININE: 1.35 mg/dL — AB (ref 0.44–1.00)
Calcium: 8.9 mg/dL (ref 8.9–10.3)
GFR calc Af Amer: 42 mL/min — ABNORMAL LOW (ref >60)
GFR calc non Af Amer: 36 mL/min — ABNORMAL LOW (ref >60)
Glucose, Bld: 112 mg/dL — ABNORMAL HIGH (ref 65–99)
Potassium: 3.9 mmol/L (ref 3.5–5.1)
SODIUM: 132 mmol/L — AB (ref 135–145)

## 2016-11-14 LAB — CBC
HCT: 36.8 % (ref 36.0–46.0)
HEMOGLOBIN: 12.7 g/dL (ref 12.0–15.0)
MCH: 31.8 pg (ref 26.0–34.0)
MCHC: 34.5 g/dL (ref 30.0–36.0)
MCV: 92.2 fL (ref 78.0–100.0)
Platelets: 259 10*3/uL (ref 150–400)
RBC: 3.99 MIL/uL (ref 3.87–5.11)
RDW: 14 % (ref 11.5–15.5)
WBC: 9 10*3/uL (ref 4.0–10.5)

## 2016-11-14 LAB — CORTISOL: CORTISOL PLASMA: 18.9 ug/dL

## 2016-11-14 MED ORDER — SODIUM CHLORIDE 0.9 % IV SOLN
INTRAVENOUS | Status: DC
  Administered 2016-11-14 – 2016-11-15 (×3): via INTRAVENOUS

## 2016-11-14 NOTE — Progress Notes (Addendum)
[facility] PROGRESS NOTE  Tonya Payne OZH:[id number] DOB: [birth year] DOA: 11/12/2016 PCP: Rocky Morel, MD   Interim summary and HPI  82 y.o. female with Past medical history of coronary artery disease. AAA, A. fib, CABG, COPD, depression. Patient presented with complains of nausea and vomiting started 2 days ago. She also had poor appetite. Patient started developing dizziness and lightheadedness the next day and become more fatigued and lethargic. Patient was actually taken to urgent care, workup was performed and the patient was discharged back home. After reaching home the patient was called back to come to the hospital to get admitted due to low sodium.  Assessment/Plan: 1-hyponatremia/dizziness and orthostatic changes: due to poor PO intake and dehydration -will continue IVF, follow orthostatic VS -continue holding antihypertensive agents for now -continue supportive care and follow PT/OT assessment/recommendations  2-abdominal aortic aneurysm  -6.5 cm -no signs of ruptured  -present since 2016 -needs outpatient follow up with vascular surgery; vascular team contacted to set up follow up. -might need endovascular treatment if found to be a good candidate for it.  3-hx of CAD -no CP -no abnormalities on telemetry -will resume b-blockers when hemodynamically stable -will continue aspirin and statins  4-mood disorder -continue zyprexa -continue to hold zoloft for now -no hallucinations, no SI  5-hx of alcohol use  -patient drink 1 glass of wine almost on daily basis -has never experienced signs or symptoms of withdrawal -will continue to monitor.  6-nausea and vomiting -no acute abnormalities seen on X-ray -will continue PRN antiemetics  -continue PPI  7-acute on chronic renal failure -due to dehydration and hypotension  -continue IVF's -minimize nephrotoxic agents  Code Status: DNR Family Communication: discussed with son at bedside  Disposition  Plan: patient continue to have orthostatic changes and now with Acute on chronic renal failure. Will continue IVF's, will continue holding antihypertensive drugs, and continue supportive care.   Consultants:  None   Procedures:  See below for x-ray reports   Antibiotics:  None   HPI/Subjective: Afebrile, no CP and no SOB. Feeling slightly better, but still dizzy and lightheaded. Patient denies nausea and vomiting.  Objective: Vitals:   11/14/16 0527 11/14/16 1348  BP: 137/68 (!) 112/56  Pulse: 83 100  Resp: 18 18  Temp: 97.6 F (36.4 C) 97.7 F (36.5 C)    Intake/Output Summary (Last 24 hours) at 11/14/16 1908 Last data filed at 11/14/16 1100  Gross per 24 hour  Intake              240 ml  Output                0 ml  Net              240 ml   Filed Weights   11/12/16 1042 11/13/16 0621 11/14/16 0527  Weight: 53.5 kg (118 lb) 50 kg (110 lb 3.7 oz) 49.8 kg (109 lb 12.6 oz)    Exam:   General: Patient feeling slightly better overall. Even though still reports some dizziness and lightheadedness when changing positions. Orthostatic vital signs this morning are positive. And her basic metabolic panel demonstrated acute kidney injury.   Cardiovascular: S1 and S2, no rubs. No gallops, positive SEM  Respiratory: good air movement, no wheezing, no crackles.  Abdomen: soft, NT, ND, positive BS, no bruits  Musculoskeletal: no edema, no cyanosis    Data Reviewed: Basic Metabolic Panel:  Recent Labs Lab 11/12/16 1606 11/12/16 1920 11/12/16 2308 11/13/16 0441 11/13/16 0850 11/14/16 6295  NA 128* 128* 128* 130* 131* 132*  K 4.6 3.4* 3.5 3.7 3.8 3.9  CL 93* 94* 94* 96* 96* 97*  CO2 23 26 24 24 23 23   GLUCOSE 95 132* 97 100* 95 112*  BUN 13 14 16 16 15 16   CREATININE 0.94 1.21* 1.16* 1.20* 1.10* 1.35*  CALCIUM 8.9 9.0 8.9 9.0 9.0 8.9  MG 1.9  --   --  2.1  --   --   PHOS 2.6  --   --   --   --   --    Liver Function Tests:  Recent Labs Lab 11/12/16 1145  11/12/16 1606  AST 38 40  ALT 19 20  ALKPHOS 113 105  BILITOT 1.0 0.9  PROT 7.9 7.3  ALBUMIN 4.4 3.8   CBC:  Recent Labs Lab 11/12/16 1145 11/14/16 0427  WBC 10.5 9.0  NEUTROABS 8.2*  --   HGB 12.8 12.7  HCT 36.6 36.8  MCV 90.1 92.2  PLT 280 259    Studies: No results found.  Scheduled Meds: . aspirin EC  81 mg Oral Daily  . atorvastatin  40 mg Oral q1800  . enoxaparin (LOVENOX) injection  30 mg Subcutaneous Q24H  . feeding supplement (ENSURE ENLIVE)  237 mL Oral Daily  . fluticasone  1 spray Each Nare Daily  . folic acid  1 mg Oral Daily  . loratadine  10 mg Oral Daily  . OLANZapine  5 mg Oral QPM  . sodium chloride flush  3 mL Intravenous Q12H  . thiamine  100 mg Oral Daily   Continuous Infusions: . sodium chloride 100 mL/hr at 11/14/16 [id number]    Principal Problem:   Hyponatremia Active Problems:   Hyperlipidemia LDL goal <70   S/P CABG x 3   Protein-calorie malnutrition, severe (HCC)   Hypertension   CKD (chronic kidney disease), stage III   Pressure injury of skin    Time spent: 30 minutes    Vassie LollMadera, Dontaye Hur  [facility] Pager [phone]. If 7PM-7AM, please contact night-coverage at [email or web address], password [email or web address] 11/14/2016, 7:08 PM  LOS: 0 days

## 2016-11-14 NOTE — Care Management Note (Signed)
Case Management Note  Patient Details  Name: Tonya Payne MRN: [id number] Date of Birth: 12-Dec-1934  Subjective/Objective:PT-recc SNF. Patient declines SNF since already active w/[facility]-rep [name] aware of HHRN/PT/OT/aide/social worker.                     Action/Plan:d/c home w/HHC.   Expected Discharge Date:   (unknown)               Expected Discharge Plan:  Home w Home Health Services  In-House Referral:     Discharge planning Services  CM Consult  Post Acute Care Choice:  Home Health (Active w/[facility] HHPT/OT) Choice offered to:  Patient  DME Arranged:    DME Agency:     HH Arranged:  RN, PT, OT, Nurse's Aide, Social Work [employer]  Status of Service:  In process, will continue to follow  If discussed at Long Length of Stay Meetings, dates discussed:    Additional Comments:  Lanier ClamMahabir, Devynn Hessler, RN 11/14/2016, 1:20 PM

## 2016-11-14 NOTE — Evaluation (Signed)
Physical Therapy Evaluation Patient Details Name: Tonya AlmSuann Alomar MRN: [id number] DOB: 1935/03/22 Today's Date: 11/14/2016   History of Present Illness  82 y.o. female with Past medical history of coronary artery disease. AAA, A. fib, CABG, COPD, depression, L hip fx s/p hip pinning 09/24/16 and admitted for hyponatremia/dizziness and orthostatic changes: due to poor PO intake and dehydration  Clinical Impression  Pt admitted with above diagnosis. Pt currently with functional limitations due to the deficits listed below (see PT Problem List).  Pt will benefit from skilled PT to increase their independence and safety with mobility to allow discharge to the venue listed below.  Pt reporting dizziness with sitting and standing.  BP sitting 133/99 mmHg and HR 111 bpm; standing BP 99/79 mmHg and HR 128.  Pt only assisted to recliner due to symptomatic orthostatic hypotension.  BP dropped to 95/71 mmHg, HR 128 after transferring to recliner.     Follow Up Recommendations SNF;Supervision/Assistance - 24 hour    Equipment Recommendations  None recommended by PT    Recommendations for Other Services       Precautions / Restrictions Precautions Precautions: Fall Precaution Comments: orthostatic      Mobility  Bed Mobility Overal bed mobility: Needs Assistance Bed Mobility: Supine to Sit     Supine to sit: Min assist;HOB elevated     General bed mobility comments: provided hand assist due to dizziness  Transfers Overall transfer level: Needs assistance Equipment used: 2 person hand held assist Transfers: Sit to/from Stand;Stand Pivot Transfers Sit to Stand: Min assist;+2 safety/equipment Stand pivot transfers: Min assist;+2 safety/equipment       General transfer comment: verbal cues for safe technique, obtained standing BP and pt c/o dizziness so returned to sitting (MD also in to see pt) pt then assisted over to recliner and continued to report dizziness, BP also dropped so did not  perform further ambulation at this time  Ambulation/Gait                Stairs            Wheelchair Mobility    Modified Rankin (Stroke Patients Only)       Balance Overall balance assessment: History of Falls                                           Pertinent Vitals/Pain Pain Assessment: No/denies pain    Home Living Family/patient expects to be discharged to:: Private residence Living Arrangements: Alone Available Help at Discharge: Friend(s);Available PRN/intermittently Type of Home: Apartment Home Access: Level entry     Home Layout: One level Home Equipment: Walker - 4 wheels;Cane - single point Additional Comments: from ILF    Prior Function Level of Independence: Independent with assistive device(s)         Comments: using rollator     Hand Dominance        Extremity/Trunk Assessment        Lower Extremity Assessment Lower Extremity Assessment: Generalized weakness       Communication   Communication: No difficulties  Cognition Arousal/Alertness: Awake/alert Behavior During Therapy: Flat affect Overall Cognitive Status: No family/caregiver present to determine baseline cognitive functioning                                 General Comments: decreased memory  General Comments General comments (skin integrity, edema, etc.): L heel pressure injury covered with allevyn dressing    Exercises     Assessment/Plan    PT Assessment Patient needs continued PT services  PT Problem List Decreased strength;Decreased mobility;Decreased knowledge of use of DME;Decreased balance;Decreased knowledge of precautions;Decreased safety awareness;Decreased activity tolerance       PT Treatment Interventions Gait training;DME instruction;Therapeutic activities;Therapeutic exercise;Functional mobility training;Patient/family education;Balance training    PT Goals (Current goals can be found in the Care  Plan section)  Acute Rehab PT Goals PT Goal Formulation: With patient Time For Goal Achievement: 11/21/16 Potential to Achieve Goals: Good    Frequency Min 3X/week   Barriers to discharge        Co-evaluation               AM-PAC PT "6 Clicks" Daily Activity  Outcome Measure Difficulty turning over in bed (including adjusting bedclothes, sheets and blankets)?: A Little Difficulty moving from lying on back to sitting on the side of the bed? : Total Difficulty sitting down on and standing up from a chair with arms (e.g., wheelchair, bedside commode, etc,.)?: Total Help needed moving to and from a bed to chair (including a wheelchair)?: A Lot Help needed walking in hospital room?: Total Help needed climbing 3-5 steps with a railing? : Total 6 Click Score: 9    End of Session Equipment Utilized During Treatment: Gait belt Activity Tolerance: Treatment limited secondary to medical complications (Comment) (drop in BP) Patient left: in chair;with call bell/phone within reach;with chair alarm set Nurse Communication: Mobility status PT Visit Diagnosis: Difficulty in walking, not elsewhere classified (R26.2)    Time: 1610-9604 PT Time Calculation (min) (ACUTE ONLY): 20 min   Charges:   PT Evaluation $PT Eval Low Complexity: 1 Procedure     PT G Codes:   PT G-Codes **NOT FOR INPATIENT CLASS** Functional Assessment Tool Used: AM-PAC 6 Clicks Basic Mobility;Clinical judgement Functional Limitation: Mobility: Walking and moving around Mobility: Walking and Moving Around Current Status (V4098): At least 60 percent but less than 80 percent impaired, limited or restricted Mobility: Walking and Moving Around Goal Status [phone]): At least 20 percent but less than 40 percent impaired, limited or restricted    Zenovia Jarred, PT, DPT 11/14/2016 Pager: [phone]   Maida Sale E 11/14/2016, 12:27 PM

## 2016-11-15 ENCOUNTER — Inpatient Hospital Stay (HOSPITAL_COMMUNITY): Payer: Medicare Other

## 2016-11-15 LAB — BASIC METABOLIC PANEL
Anion gap: 8 (ref 5–15)
BUN: 13 mg/dL (ref 6–20)
CHLORIDE: 103 mmol/L (ref 101–111)
CO2: 23 mmol/L (ref 22–32)
CREATININE: 0.86 mg/dL (ref 0.44–1.00)
Calcium: 8.8 mg/dL — ABNORMAL LOW (ref 8.9–10.3)
GFR calc Af Amer: >60 mL/min (ref >60)
GFR calc non Af Amer: >60 mL/min (ref >60)
GLUCOSE: 105 mg/dL — AB (ref 65–99)
Potassium: 3.7 mmol/L (ref 3.5–5.1)
Sodium: 134 mmol/L — ABNORMAL LOW (ref 135–145)

## 2016-11-15 LAB — CBC
HEMATOCRIT: 34.1 % — AB (ref 36.0–46.0)
HEMOGLOBIN: 11.6 g/dL — AB (ref 12.0–15.0)
MCH: 30.9 pg (ref 26.0–34.0)
MCHC: 34 g/dL (ref 30.0–36.0)
MCV: 90.7 fL (ref 78.0–100.0)
Platelets: 240 10*3/uL (ref 150–400)
RBC: 3.76 MIL/uL — ABNORMAL LOW (ref 3.87–5.11)
RDW: 13.9 % (ref 11.5–15.5)
WBC: 7.1 10*3/uL (ref 4.0–10.5)

## 2016-11-15 MED ORDER — FLUTICASONE PROPIONATE 50 MCG/ACT NA SUSP: 1.0000 | Freq: Every day | NASAL | 2 refills | Status: AC

## 2016-11-15 MED ORDER — IOPAMIDOL (ISOVUE-370) INJECTION 76%
100.0000 mL | Freq: Once | INTRAVENOUS | Status: AC | PRN
  Administered 2016-11-15: 100 mL via INTRAVENOUS

## 2016-11-15 MED ORDER — SERTRALINE HCL 50 MG PO TABS: 25.0000 mg | ORAL_TABLET | Freq: Every morning | ORAL | Status: DC

## 2016-11-15 MED ORDER — IOPAMIDOL (ISOVUE-370) INJECTION 76%
INTRAVENOUS | Status: AC
  Filled 2016-11-15: qty 100

## 2016-11-15 NOTE — Progress Notes (Signed)
MD/CM went into rm again to talk to patient/son about d/c plans. Son had private duty services set up in the past but stated patient had declined periodically allowing the services. Son will pursue private duty services again if patient allows.  Informed them of HHC agency [facility]-they will assist w/resources for medicaid application,other community resources-if transition to ALF, or SNF is needed. MD answered all questions, & concerns about treatment/meds/d/c.  Patient/Son voiced understanding.No further CM needs.

## 2016-11-15 NOTE — Progress Notes (Signed)
[facility] PROGRESS NOTE  Tonya AlmSuann Payne WUJ:[id number] DOB: May 17, 1934 DOA: 11/12/2016 PCP: Rocky Morelostand, Robert, MD   Interim summary and HPI  82 y.o. female with Past medical history of coronary artery disease. AAA, A. fib, CABG, COPD, depression. Patient presented with complains of nausea and vomiting started 2 days ago. She also had poor appetite. Patient started developing dizziness and lightheadedness the next day and become more fatigued and lethargic. Patient was actually taken to urgent care, workup was performed and the patient was discharged back home. After reaching home the patient was called back to come to the hospital to get admitted due to low sodium.  Assessment/Plan: 1-hyponatremia/dizziness and orthostatic changes: due to poor PO intake and dehydration -will adjust IVF's rate -no further orthostatic changes and feeling better overall -patient denies nausea and is eating and drinking better -sodium level is now back to normal -no longer feeling dizzy or lightheaded -will follow rec's from PT/OT and will set up home health services. Family also looking to hire someone privately for continuous assistance and supervision.  2-abdominal aortic aneurysm  -6.5 cm seen on abd x-ray (twice); last CT demonstrating 4.9 cm (approx 2.5 months ago) -no symptoms or concerns for rupture aneurysm currently -case discussed with Dr. Randie Heinzain, and initially planning to follow as an outpatient. But given discrepancy in measurements and inappropriate image evaluation; will keep in house and check CT angio of chest, abd and pelvis. -might need endovascular treatment if found to be a good candidate for it.  3-hx of CAD -no CP -no abnormalities on telemetry -will resume b-blockers when hemodynamically stable -will continue aspirin and statins  4-mood disorder -continue zyprexa -continue to hold zoloft for now; with plans to resume at lower dose at discharge -patient has remained without  hallucinations or SI  5-hx of alcohol use  -patient drink 1 glass of wine almost on daily basis -has never experienced signs or symptoms of withdrawal -will continue to monitor.  6-nausea and vomiting -no acute abnormalities seen on X-ray -will continue PRN antiemetics  -continue PPI  7-acute on chronic renal failure: stage 3 at baseline  -appears to be secondary to dehydration and hypotension  -resolved with IVF's -Creatinine back to baseline  -will monitor closely, especially with needs for contrast -continue IVF's and keep minimizing nephrotoxic agents.  Code Status: DNR Family Communication: discussed with son at bedside  Disposition Plan: patient w/o orthostatic changes and feeling better today. Will hold discharge as per recommendations by vascular surgery (which would like a CT angio of her chest, abd and pelvis); will continue IVF's, and follow results/further rec's. Hopefully stable for discharge tomorrow.  Consultants:  Dr. Randie Heinzain (Vascular surgery)   Procedures:  See below for x-ray reports   Antibiotics:  None   HPI/Subjective: Afebrile, deneis CP, no SOB, no nausea, no vomiting. Feeling much better overall.   Objective: Vitals:   11/14/16 2100 11/15/16 0429  BP: 136/82 136/66  Pulse: 88 89  Resp: 16 16  Temp: 98 F (36.7 C) 97.8 F (36.6 C)    Intake/Output Summary (Last 24 hours) at 11/15/16 1649 Last data filed at 11/15/16 1636  Gross per 24 hour  Intake          3411.67 ml  Output             1800 ml  Net          1611.67 ml   Filed Weights   11/13/16 0621 11/14/16 0527 11/15/16 0429  Weight: 50 kg (110 lb  3.7 oz) 49.8 kg (109 lb 12.6 oz) 50.4 kg (111 lb 1.8 oz)    Exam:   General: patient feeling much better. denies abd pain, CP, SOB, nausea, vomiting and palpitations. No orthostatic changes this morning and with overall improvement in her renal function.   Cardiovascular: S1 and S2, no rubs, no gallops, positive SEM, no  JVD  Respiratory: no wheezing, no crackles, good air movement and normal resp effort.  Abdomen: soft, NT, ND, positive BS; no bruits appreciated.  Musculoskeletal: no edema, cyanosis or clubbing     Data Reviewed: Basic Metabolic Panel:  Recent Labs Lab 11/12/16 1606  11/12/16 2308 11/13/16 0441 11/13/16 0850 11/14/16 0427 11/15/16 0440  NA 128*  < > 128* 130* 131* 132* 134*  K 4.6  < > 3.5 3.7 3.8 3.9 3.7  CL 93*  < > 94* 96* 96* 97* 103  CO2 23  < > 24 24 23 23 23   GLUCOSE 95  < > 97 100* 95 112* 105*  BUN 13  < > 16 16 15 16 13   CREATININE 0.94  < > 1.16* 1.20* 1.10* 1.35* 0.86  CALCIUM 8.9  < > 8.9 9.0 9.0 8.9 8.8*  MG 1.9  --   --  2.1  --   --   --   PHOS 2.6  --   --   --   --   --   --   < > = values in this interval not displayed. Liver Function Tests:  Recent Labs Lab 11/12/16 1145 11/12/16 1606  AST 38 40  ALT 19 20  ALKPHOS 113 105  BILITOT 1.0 0.9  PROT 7.9 7.3  ALBUMIN 4.4 3.8   CBC:  Recent Labs Lab 11/12/16 1145 11/14/16 0427 11/15/16 0440  WBC 10.5 9.0 7.1  NEUTROABS 8.2*  --   --   HGB 12.8 12.7 11.6*  HCT 36.6 36.8 34.1*  MCV 90.1 92.2 90.7  PLT 280 259 240    Studies: No results found.  Scheduled Meds: . aspirin EC  81 mg Oral Daily  . atorvastatin  40 mg Oral q1800  . enoxaparin (LOVENOX) injection  30 mg Subcutaneous Q24H  . feeding supplement (ENSURE ENLIVE)  237 mL Oral Daily  . fluticasone  1 spray Each Nare Daily  . folic acid  1 mg Oral Daily  . loratadine  10 mg Oral Daily  . OLANZapine  5 mg Oral QPM  . sodium chloride flush  3 mL Intravenous Q12H  . thiamine  100 mg Oral Daily   Continuous Infusions: . sodium chloride 100 mL/hr at 11/14/16 1935    Principal Problem:   Hyponatremia Active Problems:   Hyperlipidemia LDL goal <70   S/P CABG x 3   Protein-calorie malnutrition, severe (HCC)   Hypertension   CKD (chronic kidney disease), stage III   Pressure injury of skin    Time spent: 25  minutes    Vassie Loll  [facility] Pager [phone]. If 7PM-7AM, please contact night-coverage at [email or web address], password [facility] 11/15/2016, 4:49 PM  LOS: 1 day

## 2016-11-15 NOTE — Progress Notes (Signed)
PT Cancellation Note  Patient Details Name: Tonya AlmSuann Payne MRN: [id number] DOB: 1934/11/12   Cancelled Treatment:    Reason Eval/Treat Not Completed: Patient declined, no reason specified Pt declined to mobilize.  Pt reports d/c later today and did not wish to participate despite encouragement.   Alyxandra Tenbrink,KATHrine E 11/15/2016, 11:57 AM Zenovia JarredKati Brita Jurgensen, PT, DPT 11/15/2016 Pager: [phone]

## 2016-11-16 DIAGNOSIS — N183 Chronic kidney disease, stage 3 unspecified: Secondary | ICD-10-CM

## 2016-11-16 DIAGNOSIS — I714 Abdominal aortic aneurysm, without rupture: Secondary | ICD-10-CM

## 2016-11-16 DIAGNOSIS — R5381 Other malaise: Secondary | ICD-10-CM

## 2016-11-16 DIAGNOSIS — N179 Acute kidney failure, unspecified: Secondary | ICD-10-CM

## 2016-11-16 DIAGNOSIS — E860 Dehydration: Secondary | ICD-10-CM

## 2016-11-16 LAB — CBC
HEMATOCRIT: 37.1 % (ref 36.0–46.0)
HEMOGLOBIN: 12.5 g/dL (ref 12.0–15.0)
MCH: 31.1 pg (ref 26.0–34.0)
MCHC: 33.7 g/dL (ref 30.0–36.0)
MCV: 92.3 fL (ref 78.0–100.0)
Platelets: 276 10*3/uL (ref 150–400)
RBC: 4.02 MIL/uL (ref 3.87–5.11)
RDW: 14.1 % (ref 11.5–15.5)
WBC: 8.4 10*3/uL (ref 4.0–10.5)

## 2016-11-16 LAB — BASIC METABOLIC PANEL
Anion gap: 10 (ref 5–15)
BUN: 12 mg/dL (ref 6–20)
CALCIUM: 9.1 mg/dL (ref 8.9–10.3)
CO2: 23 mmol/L (ref 22–32)
CREATININE: 0.83 mg/dL (ref 0.44–1.00)
Chloride: 101 mmol/L (ref 101–111)
GFR calc non Af Amer: >60 mL/min (ref >60)
Glucose, Bld: 109 mg/dL — ABNORMAL HIGH (ref 65–99)
Potassium: 4.6 mmol/L (ref 3.5–5.1)
SODIUM: 134 mmol/L — AB (ref 135–145)

## 2016-11-16 NOTE — Consult Note (Signed)
Hospital Consult    Reason for Consult:  AAA Requesting Physician:  Gwenlyn Perking MRN #:  [id number]  History of Present Illness: This is a 82 y.o. female with known AAA. She is not admitted to the hospital for unrelated reasons but did have abdominal pain that prompted kub and demonstrated possible 6cm aneurysm that was much larger than previous (5.3cm). She does not complain of back or abdominal pain at this time. She has not followed up with since last October.   Past Medical History:  Diagnosis Date  . AAA (abdominal aortic aneurysm) without rupture (HCC) 2016   CTA 10/23/2015 - saccular infrarenal aneurysm roughly 4.8 cm in diameter. Stable from 2016 -- Dr. Darrick Penna  . Anxiety   . Atrial fibrillation, new onset (HCC) 10/2015   after CABG, in SR at d/c  . CAD (coronary artery disease), native coronary artery 10/08/2015   Severe coronary disease with calcified left main and LAD: 5% left main going into ostial LAD. Mid LAD 95% (treated with PTCA). Proximal RCA 50%, calcified. EF was 20-25% with apical akinesis and distal anterior hypokinesis. --> Referred for CABG  . Congestive dilated cardiomyopathy (HCC) 10/08/2015   Intra-Op TEE showed EF of 40 and 45%. She had an echo with a "normal LV function "documented, but there was no reading M.D.  . COPD (chronic obstructive pulmonary disease) (HCC)   . Depression   . History of acute anterior wall MI 10/08/2015   Presented with severe chest pain and dynamic anterior ST elevations/biphasic elevations area did not meet full criteria for STEMI, and troponin was not dramatically elevated. 95% LAD treated with PTCA followed by CABG  . Hyperlipidemia LDL goal <70 10/08/2015  . IBS (irritable bowel syndrome)   . Oral thrush   . Post PTCA 10/08/2015   Presented with STEMI, severe mid LAD lesion treated with PTCA and then referred for CABG.  . TIA (transient ischemic attack)     Past Surgical History:  Procedure Laterality Date  . BACK SURGERY    .  CARDIAC CATHETERIZATION N/A 10/06/2015   Procedure: Left Heart Cath and Coronary Angiography;  Surgeon: Marykay Lex, MD;  Location: [facility] INVASIVE CV LAB;  Service: Cardiovascular;: Heavily calcified left main-LAD. 75% LM & Ost LAD, 95% d-mLAD, p-mRCA Calcified 50%. EF ~20-25%  . CARDIAC CATHETERIZATION N/A 10/06/2015   Procedure: Coronary Balloon Angioplasty;  Surgeon: Marykay Lex, MD;  Location: [facility] INVASIVE CV LAB;  Service: Cardiovascular: PTCA of 95% LAD in setting of STEMI - reduced to 70% --> would need Atherectomy-PCI vs. CABG.  Sent for CABG.  . COLECTOMY    . CORONARY ARTERY BYPASS GRAFT N/A 10/16/2015   Procedure: CORONARY ARTERY BYPASS GRAFTING (CABG)  x three, using left internal mammary artery and right leg greater saphenous vein harvested endoscopically;  Surgeon: Loreli Slot, MD;  Location: [facility] OR;  Service: Open Heart Surgery;  Laterality: N/A;  . HIP PINNING,CANNULATED Left 09/24/2016   Procedure: CANNULATED HIP PINNING LEFT HIP;  Surgeon: Durene Romans, MD;  Location: [facility] ORS;  Service: Orthopedics;  Laterality: Left;  . NECK SURGERY    . TEE WITHOUT CARDIOVERSION N/A 10/16/2015   Procedure: TRANSESOPHAGEAL ECHOCARDIOGRAM (TEE);  Surgeon: Loreli Slot, MD;  Location: [facility] OR;  Service: Open Heart Surgery - IntraOp:  EF 40-45%.    No Known Allergies  Prior to Admission medications   Medication Sig Start Date End Date Taking? Authorizing Provider  aspirin EC 81 MG tablet Take 81 mg by mouth daily.  Yes [provider]  atorvastatin (LIPITOR) 40 MG tablet Take 1 tablet (40 mg total) by mouth daily at 6 PM. Patient taking differently: Take 40 mg by mouth daily.  12/18/15  Yes Barrett, Erin R, PA-C  feeding supplement, ENSURE ENLIVE, (ENSURE ENLIVE) LIQD Take 237 mLs by mouth 2 (two) times daily between meals. Patient taking differently: Take 237 mLs by mouth daily.  09/26/16  Yes Sheikh, Omair Latif, DO  fexofenadine (ALLEGRA) 180 MG tablet Take 180 mg by mouth  every evening.    Yes [provider]  metoprolol tartrate (LOPRESSOR) 25 MG tablet Take 12.5 mg by mouth 2 (two) times daily. 07/03/16  Yes [provider]  Multiple Vitamins-Minerals (MULTIVITAMIN ADULTS PO) Take 1 tablet by mouth daily.    Yes [provider]  OLANZapine (ZYPREXA) 5 MG tablet Take 5 mg by mouth every evening.  04/26/16  Yes [provider]  traMADol (ULTRAM) 50 MG tablet Take 50 mg by mouth every 6 (six) hours as needed for moderate pain.   Yes [provider]  aspirin 81 MG chewable tablet Chew 1 tablet (81 mg total) by mouth 2 (two) times daily. Take for 4 weeks. Patient not taking: Reported on 11/12/2016 09/24/16   Lanney GinsBabish, Matthew, PA-C  docusate sodium (COLACE) 100 MG capsule Take 1 capsule (100 mg total) by mouth 2 (two) times daily. Patient not taking: Reported on 11/12/2016 09/24/16   Lanney GinsBabish, Matthew, PA-C  ferrous sulfate (FERROUSUL) 325 (65 FE) MG tablet Take 1 tablet (325 mg total) by mouth 3 (three) times daily with meals. Patient not taking: Reported on 11/12/2016 09/24/16   Lanney GinsBabish, Matthew, PA-C  fluticasone [facility](FLONASE) 50 MCG/ACT nasal spray Place 1 spray into both nostrils daily. 11/16/16   Vassie LollMadera, Carlos, MD  HYDROcodone-acetaminophen (NORCO) 7.5-325 MG tablet Take 1-2 tablets by mouth every 4 (four) hours as needed for moderate pain or severe pain. Patient not taking: Reported on 11/12/2016 09/24/16   Lanney GinsBabish, Matthew, PA-C  methocarbamol (ROBAXIN) 500 MG tablet Take 1 tablet (500 mg total) by mouth every 6 (six) hours as needed for muscle spasms. Patient not taking: Reported on 11/12/2016 09/24/16   Lanney GinsBabish, Matthew, PA-C  polyethylene glycol (MIRALAX / GLYCOLAX) packet Take 17 g by mouth 2 (two) times daily. Patient not taking: Reported on 11/12/2016 09/24/16   Lanney GinsBabish, Matthew, PA-C  sertraline (ZOLOFT) 50 MG tablet Take 0.5 tablets (25 mg total) by mouth every morning. 11/16/16   Vassie LollMadera, Carlos, MD    Social History   Social  History  . Marital status: Widowed    Spouse name: N/A  . Number of children: N/A  . Years of education: N/A   Occupational History  . Not on file.   Social History Main Topics  . Smoking status: Former Smoker    Packs/day: 0.50    Types: Cigarettes    Quit date: 04/11/2015  . Smokeless tobacco: Never Used  . Alcohol use 1.2 oz/week    2 Glasses of wine per week  . Drug use: No  . Sexual activity: No   Other Topics Concern  . Not on file   Social History Narrative  . No narrative on file     Family History  Problem Relation Age of Onset  . Stroke Mother        dead  . Clotting disorder Father        dead  bone marrow disease    REVIEW OF SYSTEMS (negative unless checked):   Cardiac:  []   Chest pain or chest pressure? []  Shortness of breath upon activity? []  Shortness of breath when lying flat? []  Irregular heart rhythm?  Vascular:  []  Pain in calf, thigh, or hip brought on by walking? []  Pain in feet at night that wakes you up from your sleep? []  Blood clot in your veins? []  Leg swelling?  Pulmonary:  []  Oxygen at home? []  Productive cough? []  Wheezing?  Neurologic:  []  Sudden weakness in arms or legs? []  Sudden numbness in arms or legs? []  Sudden onset of difficult speaking or slurred speech? []  Temporary loss of vision in one eye? []  Problems with dizziness?  Gastrointestinal:  []  Blood in stool? []  Vomited blood?  Genitourinary:  []  Burning when urinating? []  Blood in urine?  Psychiatric:  []  Major depression  Hematologic:  []  Bleeding problems? []  Problems with blood clotting?  Dermatologic:  []  Rashes or ulcers?  Constitutional:  []  Fever or chills?  Ear/Nose/Throat:  []  Change in hearing? []  Nose bleeds? []  Sore throat?  Musculoskeletal:  []  Back pain? []  Joint pain? []  Muscle pain?   Physical Examination  Vitals:   11/15/16 2255 11/16/16 0621  BP: (!) 173/98 (!) 159/98  Pulse: 92 85  Resp: 16 16  Temp: 97.7 F  (36.5 C) 97.7 F (36.5 C)   Body mass index is 21.12 kg/m.  General:  WDWN in NAD HENT: WNL, normocephalic Pulmonary: normal non-labored breathing, without Rales, rhonchi,  wheezing Cardiac: rrr. 2+ right femoral, 1+ left femoral Abdomen: soft, easily palpable pulsatile mass Extremities: no cce, no ischemic changes Musculoskeletal: no muscle wasting or atrophy  Neurologic: A&O X 3; SENSATION: normal; MOTOR FUNCTION:  moving all extremities equally. Speech is fluent/normal Psychiatric: appropriate mood and affect  CBC    Component Value Date/Time   WBC 8.4 11/16/2016 0537   RBC 4.02 11/16/2016 0537   HGB 12.5 11/16/2016 0537   HCT 37.1 11/16/2016 0537   PLT 276 11/16/2016 0537   MCV 92.3 11/16/2016 0537   MCH 31.1 11/16/2016 0537   MCHC 33.7 11/16/2016 0537   RDW 14.1 11/16/2016 0537   LYMPHSABS 1.1 11/12/2016 1145   MONOABS 1.1 (H) 11/12/2016 1145   EOSABS 0.0 11/12/2016 1145   BASOSABS 0.0 11/12/2016 1145    BMET    Component Value Date/Time   NA 134 (L) 11/16/2016 0537   NA 134 (A) 03/08/2016   K 4.6 11/16/2016 0537   CL 101 11/16/2016 0537   CO2 23 11/16/2016 0537   GLUCOSE 109 (H) 11/16/2016 0537   BUN 12 11/16/2016 0537   BUN 20 03/08/2016   CREATININE 0.83 11/16/2016 0537   CREATININE 1.50 (H) 11/09/2015 0947   CALCIUM 9.1 11/16/2016 0537   GFRNONAA >60 11/16/2016 0537   GFRAA >60 11/16/2016 0537    COAGS: Lab Results  Component Value Date   INR 1.04 11/13/2016   INR 1.51 (H) 10/16/2015   INR 1.07 10/15/2015     Non-Invasive Vascular Imaging:   IMPRESSION: 5.6 cm infrarenal atherosclerotic abdominal aortic aneurysm.  No other acute vascular process.  Pulmonary emphysema and scattered parenchymal all scarring and bibasilar fibrosis.  No acute intrathoracic process  No acute intra-abdominal or pelvic finding.  ASSESSMENT/PLAN: This is a 82 y.o. female with large AAA at 5.6 cm that is indicated for elective repair. Previously was  worked up for EVAR but current CTA makes her a marginal candidate at best. She would be high risk for open AAA repair but may be only option. I discussed with patient  and her son. Will have them f/u with Dr. Darrick Penna in 2-3 weeks for review of CTA and options at that time. Will seek emergent evaluation with new onset back or abdominal pain.  Kaylynne Andres C. Randie Heinz, MD [facility] Office: [phone] Pager: [phone]

## 2016-11-16 NOTE — Discharge Summary (Addendum)
Physician Discharge Summary  Tonya AlmSuann Payne ION:[id number]:[id number] DOB: [birth year] DOA: 11/12/2016  PCP: Rocky Morelostand, Robert, MD  Admit date: 11/12/2016 Discharge date: 11/16/2016  Time spent: 35 minutes  Recommendations for Outpatient Follow-up:  1. Repeat BMET to assess electrolytes and renal function  2. Reassess BP and adjust medications as needed    Discharge Diagnoses:  Principal Problem:   Hyponatremia Active Problems:   Hyperlipidemia LDL goal <70   S/P CABG x 3   Protein-calorie malnutrition, severe (HCC)   Hypertension   CKD (chronic kidney disease), stage III   Pressure injury of skin (bilateral heels)   Acute renal failure superimposed on stage 3 chronic kidney disease (HCC)   Dehydration   Physical deconditioning   Discharge Condition: stable and improved. Will discharge home with home health services. Follow up in 2-3 weeks with vascular surgery service and instructed to follow up with PCP in 10 days.  Diet recommendation: heart healthy diet   Filed Weights   11/14/16 0527 11/15/16 0429 11/16/16 0621  Weight: 49.8 kg (109 lb 12.6 oz) 50.4 kg (111 lb 1.8 oz) 50.7 kg (111 lb 12.4 oz)    History of present illness:  82 y.o.femalewith Past medical history of coronary artery disease. AAA, A. fib, CABG, COPD, depression. Patient presented with complains of nausea and vomiting started 2 days ago. She also had poor appetite. Patient started developing dizziness and lightheadedness the next day and become more fatigued and lethargic. Patient was actually taken to urgent care, workup was performed and the patient was discharged back home. After reaching home the patient was called back to come to the hospital to get admitted due to low sodium.  Hospital Course:  1-hyponatremia/dizziness and orthostatic changes: due to poor PO intake and dehydration -patient advise to keep herself well hydrated  -no further orthostatic changes and feeling a lot better overall -patient denies  nausea and is eating and drinking much better -sodium level is now back to normal -no longer feeling dizzy or lightheaded -will follow rec's from PT/OT and will set up home health services. Family also looking to hire someone privately for continuous assistance and supervision.  2-abdominal aortic aneurysm  -6.5 cm seen on abd x-ray (twice); last CT demonstrating 4.9 cm (approx 2.5 months ago) -repeat CT angio of chest, abd and pelvis during this admission demonstrated AAA of 5.6cm -no symptoms or concerns for rupture aneurysm currently -case discussed with Dr. Randie Heinzain, and after reviewing new images, will discharge patient and arrange outpatient follow up. At that visit, further decision for definitive treatment will be discussed with patient.   -might need endovascular treatment if found to be a good candidate for it.  3-hx of CAD -no CP -no abnormalities seen on EKG or on telemetry -will resume b-blockers at discharge -will continue aspirin and statins  4-mood disorder -continue zyprexa -will resume at lower dose at discharge (can cause orthostatic changes) -patient has remained without hallucinations or SI -mood stable  5-hx of alcohol use  -patient drink 1 glass of wine almost on daily basis -has never experienced signs or symptoms of withdrawal -no withdrawal symptoms seen during hospitalization   6-nausea and vomiting -no acute abnormalities seen on X-ray -patient denies abd pain, nausea and vomiting at discharge -continue PPI -most likely due to viral gastroenteritis   7-acute on chronic renal failure: stage 3 at baseline  -appears to be secondary to dehydration and hypotension  -resolved with IVF's -Creatinine back to baseline  -will recommend BMET at follow to reassess renal  function -patient advise to keep herself well hydrated   8-physical deconditioning  -HH services has been arranged at discharge -family also looking to hire some private duty services for  further care and assistance at home.  9-pressure injuries bilateral heels -present on admission -unstageable and healing already  -recommended to continue preventive measures and good skin care.   Procedures:  See below for x-ray reports   Consultations:  Vascular surgery   Discharge Exam: Vitals:   11/15/16 2255 11/16/16 0621  BP: (!) 173/98 (!) 159/98  Pulse: 92 85  Resp: 16 16  Temp: 97.7 F (36.5 C) 97.7 F (36.5 C)    General: patient feeling much better. Denies abd pain, CP, SOB, nausea, vomiting and palpitations. No orthostatic changes this morning and with overall improvement in her renal function.   Cardiovascular: S1 and S2, no rubs, no gallops, positive SEM, no JVD  Respiratory: no wheezing, no crackles, good air movement and normal resp effort.  Abdomen: soft, NT, ND, positive BS; no bruits appreciated.  Musculoskeletal: no edema, cyanosis or clubbing     Skin: bilateral chronic, already healing and unstageable heels pressure injury; no drainage, no surrounding erythema.  Discharge Instructions   Discharge Instructions    Diet - low sodium heart healthy    Complete by:  As directed    Diet - low sodium heart healthy    Complete by:  As directed    Discharge instructions    Complete by:  As directed    Keep yourself well hydrated Take medications as prescribed Arrange follow up with PCP in 10 days Please follow up with vascular surgery as instructed.   Discharge instructions    Complete by:  As directed    Take medications as prescribed Maintain adequate hydration  Arrange follow up with PCP in10 days Follow up with vascular surgery as instructed  Follow heart healthy diet   Increase activity slowly    Complete by:  As directed    Increase activity slowly    Complete by:  As directed      Current Discharge Medication List    START taking these medications   Details  fluticasone (FLONASE) 50 MCG/ACT nasal spray Place 1 spray into both  nostrils daily. Qty: 16 g, Refills: 2      CONTINUE these medications which have CHANGED   Details  sertraline (ZOLOFT) 50 MG tablet Take 0.5 tablets (25 mg total) by mouth every morning.      CONTINUE these medications which have NOT CHANGED   Details  aspirin EC 81 MG tablet Take 81 mg by mouth daily.    atorvastatin (LIPITOR) 40 MG tablet Take 1 tablet (40 mg total) by mouth daily at 6 PM. Qty: 30 tablet, Refills: 3    feeding supplement, ENSURE ENLIVE, (ENSURE ENLIVE) LIQD Take 237 mLs by mouth 2 (two) times daily between meals. Qty: 237 mL, Refills: 12    fexofenadine (ALLEGRA) 180 MG tablet Take 180 mg by mouth every evening.     metoprolol tartrate (LOPRESSOR) 25 MG tablet Take 12.5 mg by mouth 2 (two) times daily. Refills: 3    Multiple Vitamins-Minerals (MULTIVITAMIN ADULTS PO) Take 1 tablet by mouth daily.     OLANZapine (ZYPREXA) 5 MG tablet Take 5 mg by mouth every evening.  Refills: 3    docusate sodium (COLACE) 100 MG capsule Take 1 capsule (100 mg total) by mouth 2 (two) times daily. Qty: 10 capsule, Refills: 0    polyethylene glycol (MIRALAX /  GLYCOLAX) packet Take 17 g by mouth 2 (two) times daily. Qty: 14 each, Refills: 0      STOP taking these medications     traMADol (ULTRAM) 50 MG tablet      aspirin 81 MG chewable tablet      ferrous sulfate (FERROUSUL) 325 (65 FE) MG tablet      HYDROcodone-acetaminophen (NORCO) 7.5-325 MG tablet      methocarbamol (ROBAXIN) 500 MG tablet        No Known Allergies Follow-up Information    [facility] Follow up.   Specialty:  Home Health Services Why:  [facility] nursing/physical/occupational therapy,aide/social worker Contact information: [address] [place] [address] [phone]        Rocky Morel, MD. Schedule an appointment as soon as possible for a visit in 10 day(s).   Specialty:  Internal Medicine Contact information: [address] [place]  [address] [phone]        Sherren Kerns, MD Follow up.   Specialties:  Vascular Surgery, Cardiology Why:  office will contact you with appointment details, for follow up visit in 2-3 weeks  Contact information: [address] [place] [address] [phone]           The results of significant diagnostics from this hospitalization (including imaging, microbiology, ancillary and laboratory) are listed below for reference.    Significant Diagnostic Studies: Dg Chest 2 View  Result Date: 11/12/2016 CLINICAL DATA:  Weakness.  Dizziness.  The patient fell last night. EXAM: CHEST  2 VIEW COMPARISON:  09/23/2016 and chest CT dated 06/05/2016 FINDINGS: Heart size and pulmonary vascularity are normal. There are old healed right seventh and ninth rib fractures. There is chronic interstitial disease at the right lung base, accentuated since the prior chest x-ray. There is also scarring in the right midzone which is stable. There is slight scarring at the left lung base laterally. No acute bone abnormality.  CABG.  Aortic atherosclerosis. IMPRESSION: 1. No acute abnormality. 2. Chronic interstitial lung disease. 3. Aortic atherosclerosis. Electronically Signed   By: Francene Boyers M.D.   On: 11/12/2016 11:15   Ct Head Wo Contrast  Result Date: 11/12/2016 CLINICAL DATA:  Weakness. Seen in the ER for abnormal labs. Fall last night with midline neck pain. Initial encounter. EXAM: CT HEAD WITHOUT CONTRAST CT CERVICAL SPINE WITHOUT CONTRAST TECHNIQUE: Multidetector CT imaging of the head and cervical spine was performed following the standard protocol without intravenous contrast. Multiplanar CT image reconstructions of the cervical spine were also generated. COMPARISON:  09/23/2016 FINDINGS: CT HEAD FINDINGS Brain: Remote lateral lenticulostriate infarct on the left primarily affecting the caudate head and extreme anterior limb of the internal capsule. Remote small infarct in the right occipital  cortex. Patchy microvascular ischemic gliosis in the cerebral white matter. No acute infarct, hemorrhage, or hydrocephalus. No masslike findings. Vascular: Atherosclerotic calcification.  No hyperdense vessel. Skull: Negative for fracture. Sinuses/Orbits: No acute finding.  Left cataract resection. CT CERVICAL SPINE FINDINGS Alignment: Normal. Skull base and vertebrae: Negative for fracture Soft tissues and spinal canal: No prevertebral fluid or swelling. No visible canal hematoma. Cervical carotid atherosclerosis. Disc levels: C5-6 and C6-7 ACDF. C6-7 interbody arthrodesis is not clearly seen, but no signs of hardware failure. Upper chest: Centrilobular emphysema and subpleural fibrosis. IMPRESSION: 1. No evidence of intracranial or cervical spine injury. 2. Stable chronic cerebral ischemic injury as described above. 3. Emphysema. Electronically Signed   By: Kathrynn Ducking.D.  On: 11/12/2016 12:22   Ct Cervical Spine Wo Contrast  Result Date: 11/12/2016 CLINICAL DATA:  Weakness. Seen in the ER for abnormal labs. Fall last night with midline neck pain. Initial encounter. EXAM: CT HEAD WITHOUT CONTRAST CT CERVICAL SPINE WITHOUT CONTRAST TECHNIQUE: Multidetector CT imaging of the head and cervical spine was performed following the standard protocol without intravenous contrast. Multiplanar CT image reconstructions of the cervical spine were also generated. COMPARISON:  09/23/2016 FINDINGS: CT HEAD FINDINGS Brain: Remote lateral lenticulostriate infarct on the left primarily affecting the caudate head and extreme anterior limb of the internal capsule. Remote small infarct in the right occipital cortex. Patchy microvascular ischemic gliosis in the cerebral white matter. No acute infarct, hemorrhage, or hydrocephalus. No masslike findings. Vascular: Atherosclerotic calcification.  No hyperdense vessel. Skull: Negative for fracture. Sinuses/Orbits: No acute finding.  Left cataract resection. CT CERVICAL SPINE  FINDINGS Alignment: Normal. Skull base and vertebrae: Negative for fracture Soft tissues and spinal canal: No prevertebral fluid or swelling. No visible canal hematoma. Cervical carotid atherosclerosis. Disc levels: C5-6 and C6-7 ACDF. C6-7 interbody arthrodesis is not clearly seen, but no signs of hardware failure. Upper chest: Centrilobular emphysema and subpleural fibrosis. IMPRESSION: 1. No evidence of intracranial or cervical spine injury. 2. Stable chronic cerebral ischemic injury as described above. 3. Emphysema. Electronically Signed   By: Marnee Spring M.D.   On: 11/12/2016 12:22   Dg Abd Portable 1v  Result Date: 11/12/2016 CLINICAL DATA:  82 year old female with weakness dizziness and back pain. Initial encounter. EXAM: PORTABLE ABDOMEN - 1 VIEW COMPARISON:  09/23/2016. FINDINGS: Large abdominal aortic aneurysm measuring up to 6.5 cm similar to the 09/24/2016 examination. If there is any clinical suspicion of this causing patient's back pain, CT recommended. No evidence to suggest bowel obstruction. The possibility of free intraperitoneal air cannot be assessed on a supine view. Prior left hip surgery. IMPRESSION: Large abdominal aortic aneurysm measuring up to 6.5 cm similar to the 09/24/2016 examination. If there is any clinical suspicion of this causing patient's back pain, CT recommended. No evidence to suggest bowel obstruction. These results were called by telephone at the time of interpretation on 11/12/2016 at 8:22 pm to [facility], patient's nurse who verbally acknowledged these results. Request to call physician on-call. Electronically Signed   By: Lacy Duverney M.D.   On: 11/12/2016 20:25   Ct Angio Chest/abd/pel For Dissection W And/or W/wo  Result Date: 11/16/2016 CLINICAL DATA:  Abdominal aortic aneurysm, nausea, vomiting EXAM: CT ANGIOGRAPHY CHEST, ABDOMEN AND PELVIS TECHNIQUE: Multidetector CT imaging through the chest, abdomen and pelvis was performed using the standard protocol  during bolus administration of intravenous contrast. Multiplanar reconstructed images and MIPs were obtained and reviewed to evaluate the vascular anatomy. CONTRAST:  100 cc Isovue 370 COMPARISON:  Abdominal radiograph 17 2018 FINDINGS: CTA CHEST FINDINGS Cardiovascular: Calcific atherosclerosis of the major branch vessels and thoracic aorta diffusely. Ascending thoracic aorta has a maximal AP diameter 3.3 cm. Initial noncontrast imaging demonstrates no definite hyperdense intramural hematoma. No mediastinal hemorrhage or hematoma. Thoracic aorta remains patent. No dissection. Three-vessel arch anatomy appearing patent. Central pulmonary arteries are patent. No significant proximal pulmonary embolus. Native coronary atherosclerosis noted. Previous coronary bypass changes evident. Normal heart size. No pericardial effusion. Mediastinum/Nodes: No enlarged mediastinal, hilar, or axillary lymph nodes. Thyroid gland, trachea, and esophagus demonstrate no significant findings. Lungs/Pleura: Background mixed centrilobular and paraseptal emphysema throughout the upper lobes. Apical pleuroparenchymal scarring noted. Scattered nonspecific areas of peripheral bronchovascular irregular opacities, suspect parenchymal distortion/scarring.  Basilar fibrotic pattern also evident. No definite superimposed acute pneumonia, collapse or consolidation. No pleural abnormality, pleural effusion, or pneumothorax. Trachea central airways appear patent. Musculoskeletal: Thoracic spine appears intact. No compression fracture. Remote median sternotomy evident. No acute osseous finding. Review of the MIP images confirms the above findings. CTA ABDOMEN AND PELVIS FINDINGS VASCULAR Aorta: Abdominal aorta demonstrates an infrarenal atherosclerotic and fusiform aneurysm measuring 5.2 in AP dimension and 5.6 cm in transverse dimension. Extensive wall calcification and mural thrombus. Aortic lumen remains patent. No evidence of acute rupture  retroperitoneal hemorrhage. Aneurysm extends to the bifurcation. Celiac: Atherosclerotic origin but remains patent SMA: Atherosclerotic origin but remains patent Renals: Heavily calcified atherosclerotic origins. Difficult to exclude renal artery stenosis. IMA: IMA is occluded off of the abdominal aortic aneurysm. Inflow: Heavily calcified iliac vessels. Common, internal and external iliac arteries remain patent. No iliac occlusion or aneurysm. Veins: Venous imaging not performed. Review of the MIP images confirms the above findings. NON-VASCULAR Hepatobiliary: Small 5 mm left hepatic lobe hypodensity, image 92 suspect small cyst. No other significant hepatic abnormality by arterial phase imaging. Gallbladder is collapsed. No biliary dilatation. Pancreas: Unremarkable. No pancreatic ductal dilatation or surrounding inflammatory changes. Spleen: Normal in size without focal abnormality. Adrenals/Urinary Tract: Normal adrenal glands for age. Renal cortical atrophy evident bilaterally. No focal renal abnormality, hydronephrosis, or obstructing ureteral calculus on either side. Ureters are symmetric and decompressed. Urinary bladder unremarkable. Stomach/Bowel: Negative for bowel obstruction, significant dilatation, ileus, or free air. Scattered mild colonic diverticulosis. No fluid collection or abscess. Appendix not demonstrated. Lymphatic: No adenopathy Reproductive: Remote hysterectomy. No pelvic or adnexal abnormality. Other: No abdominal wall hernia or abnormality. No abdominopelvic ascites. Musculoskeletal: Postop changes of the left hip. No acute osseous finding. Review of the MIP images confirms the above findings. IMPRESSION: 5.6 cm infrarenal atherosclerotic abdominal aortic aneurysm. No other acute vascular process. Pulmonary emphysema and scattered parenchymal all scarring and bibasilar fibrosis. No acute intrathoracic process No acute intra-abdominal or pelvic finding. Electronically Signed   By: Judie Petit.  Shick  M.D.   On: 11/16/2016 09:12   Labs: Basic Metabolic Panel:  Recent Labs Lab 11/12/16 1606  11/13/16 0441 11/13/16 0850 11/14/16 0427 11/15/16 0440 11/16/16 0537  NA 128*  < > 130* 131* 132* 134* 134*  K 4.6  < > 3.7 3.8 3.9 3.7 4.6  CL 93*  < > 96* 96* 97* 103 101  CO2 23  < > 24 23 23 23 23   GLUCOSE 95  < > 100* 95 112* 105* 109*  BUN 13  < > 16 15 16 13 12   CREATININE 0.94  < > 1.20* 1.10* 1.35* 0.86 0.83  CALCIUM 8.9  < > 9.0 9.0 8.9 8.8* 9.1  MG 1.9  --  2.1  --   --   --   --   PHOS 2.6  --   --   --   --   --   --   < > = values in this interval not displayed. Liver Function Tests:  Recent Labs Lab 11/12/16 1145 11/12/16 1606  AST 38 40  ALT 19 20  ALKPHOS 113 105  BILITOT 1.0 0.9  PROT 7.9 7.3  ALBUMIN 4.4 3.8   CBC:  Recent Labs Lab 11/12/16 1145 11/14/16 0427 11/15/16 0440 11/16/16 0537  WBC 10.5 9.0 7.1 8.4  NEUTROABS 8.2*  --   --   --   HGB 12.8 12.7 11.6* 12.5  HCT 36.6 36.8 34.1* 37.1  MCV 90.1 92.2  90.7 92.3  PLT 280 259 240 276    Signed:  Vassie Loll MD.  [facility] 11/16/2016, 10:50 AM

## 2016-11-19 ENCOUNTER — Telehealth: Payer: Self-pay | Admitting: Vascular Surgery

## 2016-11-19 NOTE — Telephone Encounter (Signed)
Sched appt 01/09/17 at 2:15. Spoke to pt.

## 2016-11-19 NOTE — Telephone Encounter (Signed)
-----   Message from Sharee PimpleMarilyn K McChesney, RN sent at 11/17/2016  8:02 PM EDT ----- Regarding: 2-3 weeks with Dr Darrick PennaFields   ----- Message ----- From: Maeola Harmanain, Tonya Christopher, MD Sent: 11/16/2016  11:13 AM To: [facility] Charge Pool  Tonya Payne [id number] 1934/12/08  Inpatient level 3 consult Dx: AAA  F/u in 2-3 weeks with Dr. Darrick PennaFields

## 2016-12-29 ENCOUNTER — Emergency Department (HOSPITAL_COMMUNITY): Payer: Medicare Other

## 2016-12-29 ENCOUNTER — Inpatient Hospital Stay (HOSPITAL_COMMUNITY)
Admission: EM | Admit: 2016-12-29 | Discharge: 2017-01-01 | DRG: 300 | Disposition: A | Discharge status: Skilled Nursing Facility | Payer: Medicare Other | Attending: Nephrology | Admitting: Nephrology

## 2016-12-29 DIAGNOSIS — I420 Dilated cardiomyopathy: Secondary | ICD-10-CM | POA: Diagnosis present

## 2016-12-29 DIAGNOSIS — Z8673 Personal history of transient ischemic attack (TIA), and cerebral infarction without residual deficits: Secondary | ICD-10-CM | POA: Diagnosis not present

## 2016-12-29 DIAGNOSIS — W19XXXA Unspecified fall, initial encounter: Secondary | ICD-10-CM | POA: Diagnosis present

## 2016-12-29 DIAGNOSIS — G3184 Mild cognitive impairment, so stated: Secondary | ICD-10-CM | POA: Diagnosis present

## 2016-12-29 DIAGNOSIS — S22079A Unspecified fracture of T9-T10 vertebra, initial encounter for closed fracture: Secondary | ICD-10-CM | POA: Diagnosis present

## 2016-12-29 DIAGNOSIS — Z96653 Presence of artificial knee joint, bilateral: Secondary | ICD-10-CM | POA: Diagnosis present

## 2016-12-29 DIAGNOSIS — Z87891 Personal history of nicotine dependence: Secondary | ICD-10-CM

## 2016-12-29 DIAGNOSIS — E785 Hyperlipidemia, unspecified: Secondary | ICD-10-CM | POA: Diagnosis not present

## 2016-12-29 DIAGNOSIS — B9620 Unspecified Escherichia coli [E. coli] as the cause of diseases classified elsewhere: Secondary | ICD-10-CM | POA: Diagnosis present

## 2016-12-29 DIAGNOSIS — Z789 Other specified health status: Secondary | ICD-10-CM | POA: Diagnosis present

## 2016-12-29 DIAGNOSIS — I714 Abdominal aortic aneurysm, without rupture, unspecified: Secondary | ICD-10-CM

## 2016-12-29 DIAGNOSIS — W010XXA Fall on same level from slipping, tripping and stumbling without subsequent striking against object, initial encounter: Secondary | ICD-10-CM | POA: Diagnosis present

## 2016-12-29 DIAGNOSIS — Z79899 Other long term (current) drug therapy: Secondary | ICD-10-CM

## 2016-12-29 DIAGNOSIS — I25119 Atherosclerotic heart disease of native coronary artery with unspecified angina pectoris: Secondary | ICD-10-CM

## 2016-12-29 DIAGNOSIS — S0101XA Laceration without foreign body of scalp, initial encounter: Secondary | ICD-10-CM

## 2016-12-29 DIAGNOSIS — I2510 Atherosclerotic heart disease of native coronary artery without angina pectoris: Secondary | ICD-10-CM | POA: Diagnosis not present

## 2016-12-29 DIAGNOSIS — F329 Major depressive disorder, single episode, unspecified: Secondary | ICD-10-CM | POA: Diagnosis present

## 2016-12-29 DIAGNOSIS — J449 Chronic obstructive pulmonary disease, unspecified: Secondary | ICD-10-CM | POA: Diagnosis present

## 2016-12-29 DIAGNOSIS — Z951 Presence of aortocoronary bypass graft: Secondary | ICD-10-CM

## 2016-12-29 DIAGNOSIS — Z7289 Other problems related to lifestyle: Secondary | ICD-10-CM | POA: Diagnosis present

## 2016-12-29 DIAGNOSIS — N183 Chronic kidney disease, stage 3 unspecified: Secondary | ICD-10-CM | POA: Diagnosis present

## 2016-12-29 DIAGNOSIS — I4891 Unspecified atrial fibrillation: Secondary | ICD-10-CM | POA: Diagnosis present

## 2016-12-29 DIAGNOSIS — K589 Irritable bowel syndrome without diarrhea: Secondary | ICD-10-CM | POA: Diagnosis present

## 2016-12-29 DIAGNOSIS — S22000A Wedge compression fracture of unspecified thoracic vertebra, initial encounter for closed fracture: Secondary | ICD-10-CM | POA: Diagnosis not present

## 2016-12-29 DIAGNOSIS — Y92000 Kitchen of unspecified non-institutional (private) residence as  the place of occurrence of the external cause: Secondary | ICD-10-CM | POA: Diagnosis not present

## 2016-12-29 DIAGNOSIS — J439 Emphysema, unspecified: Secondary | ICD-10-CM | POA: Diagnosis not present

## 2016-12-29 DIAGNOSIS — R109 Unspecified abdominal pain: Secondary | ICD-10-CM | POA: Diagnosis not present

## 2016-12-29 DIAGNOSIS — E871 Hypo-osmolality and hyponatremia: Secondary | ICD-10-CM | POA: Diagnosis present

## 2016-12-29 DIAGNOSIS — N390 Urinary tract infection, site not specified: Secondary | ICD-10-CM

## 2016-12-29 DIAGNOSIS — Z7982 Long term (current) use of aspirin: Secondary | ICD-10-CM

## 2016-12-29 DIAGNOSIS — R1084 Generalized abdominal pain: Secondary | ICD-10-CM

## 2016-12-29 DIAGNOSIS — I252 Old myocardial infarction: Secondary | ICD-10-CM

## 2016-12-29 DIAGNOSIS — N3000 Acute cystitis without hematuria: Secondary | ICD-10-CM | POA: Diagnosis not present

## 2016-12-29 HISTORY — DX: ST elevation (STEMI) myocardial infarction involving other coronary artery of anterior wall: I21.09

## 2016-12-29 HISTORY — DX: Headache: R51

## 2016-12-29 HISTORY — DX: Headache, unspecified: R51.9

## 2016-12-29 HISTORY — DX: Migraine, unspecified, not intractable, without status migrainosus: G43.909

## 2016-12-29 HISTORY — DX: Pneumonia, unspecified organism: J18.9

## 2016-12-29 LAB — COMPREHENSIVE METABOLIC PANEL
ALK PHOS: 75 U/L (ref 38–126)
ALT: 28 U/L (ref 14–54)
AST: 40 U/L (ref 15–41)
Albumin: 3.7 g/dL (ref 3.5–5.0)
Anion gap: 9 (ref 5–15)
BUN: 21 mg/dL — AB (ref 6–20)
CALCIUM: 9.2 mg/dL (ref 8.9–10.3)
CO2: 22 mmol/L (ref 22–32)
CREATININE: 1.14 mg/dL — AB (ref 0.44–1.00)
Chloride: 101 mmol/L (ref 101–111)
GFR calc non Af Amer: 44 mL/min — ABNORMAL LOW (ref >60)
GFR, EST AFRICAN AMERICAN: 51 mL/min — AB (ref >60)
GLUCOSE: 107 mg/dL — AB (ref 65–99)
Potassium: 4.2 mmol/L (ref 3.5–5.1)
SODIUM: 132 mmol/L — AB (ref 135–145)
Total Bilirubin: 0.6 mg/dL (ref 0.3–1.2)
Total Protein: 7.2 g/dL (ref 6.5–8.1)

## 2016-12-29 LAB — URINALYSIS, ROUTINE W REFLEX MICROSCOPIC
BACTERIA UA: NONE SEEN
BILIRUBIN URINE: NEGATIVE
Glucose, UA: NEGATIVE mg/dL
Hgb urine dipstick: NEGATIVE
KETONES UR: NEGATIVE mg/dL
Nitrite: NEGATIVE
Specific Gravity, Urine: 1.015 (ref 1.005–1.030)
pH: 6.5 (ref 5.0–8.0)

## 2016-12-29 LAB — I-STAT CG4 LACTIC ACID, ED: LACTIC ACID, VENOUS: 0.91 mmol/L (ref 0.5–1.9)

## 2016-12-29 LAB — CBC WITH DIFFERENTIAL/PLATELET
BASOS ABS: 0 10*3/uL (ref 0.0–0.1)
Basophils Relative: 0 %
EOS ABS: 0.1 10*3/uL (ref 0.0–0.7)
Eosinophils Relative: 1 %
HCT: 35.6 % — ABNORMAL LOW (ref 36.0–46.0)
HEMOGLOBIN: 12.4 g/dL (ref 12.0–15.0)
LYMPHS ABS: 1.4 10*3/uL (ref 0.7–4.0)
LYMPHS PCT: 12 %
MCH: 31.8 pg (ref 26.0–34.0)
MCHC: 34.8 g/dL (ref 30.0–36.0)
MCV: 91.3 fL (ref 78.0–100.0)
Monocytes Absolute: 1 10*3/uL (ref 0.1–1.0)
Monocytes Relative: 9 %
NEUTROS PCT: 78 %
Neutro Abs: 9.2 10*3/uL — ABNORMAL HIGH (ref 1.7–7.7)
Platelets: 301 10*3/uL (ref 150–400)
RBC: 3.9 MIL/uL (ref 3.87–5.11)
RDW: 14.6 % (ref 11.5–15.5)
WBC: 11.7 10*3/uL — AB (ref 4.0–10.5)

## 2016-12-29 LAB — PROTIME-INR
INR: 1.02
Prothrombin Time: 13.4 seconds (ref 11.4–15.2)

## 2016-12-29 LAB — LIPASE, BLOOD: Lipase: 28 U/L (ref 11–51)

## 2016-12-29 MED ORDER — IOPAMIDOL (ISOVUE-300) INJECTION 61%
INTRAVENOUS | Status: AC
  Administered 2016-12-29: 80 mL via INTRAVENOUS
  Filled 2016-12-29: qty 100

## 2016-12-29 MED ORDER — MORPHINE SULFATE (PF) 4 MG/ML IV SOLN
4.0000 mg | Freq: Once | INTRAVENOUS | Status: AC
  Administered 2016-12-29: 4 mg via INTRAVENOUS
  Filled 2016-12-29: qty 1

## 2016-12-29 NOTE — ED Notes (Signed)
Patient transported to CT 

## 2016-12-29 NOTE — ED Provider Notes (Signed)
[facility]-EMERGENCY DEPT Provider Note   CSN: [id number] Arrival date & time: 12/29/16  1801     History   Chief Complaint Chief Complaint  Patient presents with  . Fall  . Head Injury    HPI Tonya Payne is a 82 y.o. female.  The history is provided by the patient, medical records and a relative.  Fall  This is a new problem. The current episode started 1 to 2 hours ago. The problem occurs rarely. The problem has not changed since onset.Associated symptoms include abdominal pain and headaches. Pertinent negatives include no chest pain and no shortness of breath. The symptoms are aggravated by twisting and walking. Nothing relieves the symptoms. She has tried nothing for the symptoms. The treatment provided no relief.    Past Medical History:  Diagnosis Date  . AAA (abdominal aortic aneurysm) without rupture (HCC) 2016   CTA 10/23/2015 - saccular infrarenal aneurysm roughly 4.8 cm in diameter. Stable from 2016 -- Dr. Darrick Penna  . Anxiety   . Atrial fibrillation, new onset (HCC) 10/2015   after CABG, in SR at d/c  . CAD (coronary artery disease), native coronary artery 10/08/2015   Severe coronary disease with calcified left main and LAD: 5% left main going into ostial LAD. Mid LAD 95% (treated with PTCA). Proximal RCA 50%, calcified. EF was 20-25% with apical akinesis and distal anterior hypokinesis. --> Referred for CABG  . Congestive dilated cardiomyopathy (HCC) 10/08/2015   Intra-Op TEE showed EF of 40 and 45%. She had an echo with a "normal LV function "documented, but there was no reading M.D.  . COPD (chronic obstructive pulmonary disease) (HCC)   . Depression   . History of acute anterior wall MI 10/08/2015   Presented with severe chest pain and dynamic anterior ST elevations/biphasic elevations area did not meet full criteria for STEMI, and troponin was not dramatically elevated. 95% LAD treated with PTCA followed by CABG  . Hyperlipidemia LDL goal <70 10/08/2015  . IBS  (irritable bowel syndrome)   . Oral thrush   . Post PTCA 10/08/2015   Presented with STEMI, severe mid LAD lesion treated with PTCA and then referred for CABG.  . TIA (transient ischemic attack)     Patient Active Problem List   Diagnosis Date Noted  . Acute renal failure superimposed on stage 3 chronic kidney disease (HCC)   . Dehydration   . Physical deconditioning   . Pressure injury of skin 11/12/2016  . CKD (chronic kidney disease), stage III 09/24/2016  . Closed left hip fracture, with routine healing, subsequent encounter 09/23/2016  . Hyponatremia 09/23/2016  . Hypertension 09/23/2016  . Left displaced femoral neck fracture (HCC)   . Closed fracture of multiple pubic rami, right, sequela 02/27/2016  . Displaced fracture of neck of right second metacarpal bone with routine healing 02/27/2016  . Protein-calorie malnutrition, severe (HCC) 02/22/2016  . Orthostatic dizziness 02/22/2016  . Postoperative atrial fibrillation (HCC) 02/22/2016  . Fall   . Hand fracture   . Pelvic fracture (HCC) 01/26/2016  . AAA (abdominal aortic aneurysm) (HCC) 11/30/2015  . Post PTCA   . S/P CABG x 3 10/16/2015  . Coronary artery disease involving native coronary artery with angina pectoris (HCC) 10/08/2015  . Congestive dilated cardiomyopathy (HCC) 10/08/2015  . Hyperlipidemia LDL goal <70 10/08/2015  . UTI (urinary tract infection) 10/06/2015  . Myocardial infarction involving left anterior descending (LAD) coronary artery (HCC) 10/06/2015    Past Surgical History:  Procedure Laterality Date  . BACK  SURGERY    . CARDIAC CATHETERIZATION N/A 10/06/2015   Procedure: Left Heart Cath and Coronary Angiography;  Surgeon: Marykay Lex, MD;  Location: [facility] INVASIVE CV LAB;  Service: Cardiovascular;: Heavily calcified left main-LAD. 75% LM & Ost LAD, 95% d-mLAD, p-mRCA Calcified 50%. EF ~20-25%  . CARDIAC CATHETERIZATION N/A 10/06/2015   Procedure: Coronary Balloon Angioplasty;  Surgeon: Marykay Lex, MD;  Location: [facility] INVASIVE CV LAB;  Service: Cardiovascular: PTCA of 95% LAD in setting of STEMI - reduced to 70% --> would need Atherectomy-PCI vs. CABG.  Sent for CABG.  . COLECTOMY    . CORONARY ARTERY BYPASS GRAFT N/A 10/16/2015   Procedure: CORONARY ARTERY BYPASS GRAFTING (CABG)  x three, using left internal mammary artery and right leg greater saphenous vein harvested endoscopically;  Surgeon: Loreli Slot, MD;  Location: [facility] OR;  Service: Open Heart Surgery;  Laterality: N/A;  . HIP PINNING,CANNULATED Left 09/24/2016   Procedure: CANNULATED HIP PINNING LEFT HIP;  Surgeon: Durene Romans, MD;  Location: [facility] ORS;  Service: Orthopedics;  Laterality: Left;  . NECK SURGERY    . TEE WITHOUT CARDIOVERSION N/A 10/16/2015   Procedure: TRANSESOPHAGEAL ECHOCARDIOGRAM (TEE);  Surgeon: Loreli Slot, MD;  Location: [facility] OR;  Service: Open Heart Surgery - IntraOp:  EF 40-45%.    OB History    No data available       Home Medications    Prior to Admission medications   Medication Sig Start Date End Date Taking? Authorizing Provider  aspirin EC 81 MG tablet Take 81 mg by mouth daily.    [provider]  atorvastatin (LIPITOR) 40 MG tablet Take 1 tablet (40 mg total) by mouth daily at 6 PM. Patient taking differently: Take 40 mg by mouth daily.  12/18/15   Barrett, Erin R, PA-C  docusate sodium (COLACE) 100 MG capsule Take 1 capsule (100 mg total) by mouth 2 (two) times daily. Patient not taking: Reported on 11/12/2016 09/24/16   Lanney Gins, PA-C  feeding supplement, ENSURE ENLIVE, (ENSURE ENLIVE) LIQD Take 237 mLs by mouth 2 (two) times daily between meals. Patient taking differently: Take 237 mLs by mouth daily.  09/26/16   Sheikh, Omair Latif, DO  fexofenadine (ALLEGRA) 180 MG tablet Take 180 mg by mouth every evening.     [provider]  fluticasone (FLONASE) 50 MCG/ACT nasal spray Place 1 spray into both nostrils daily. 11/16/16   Vassie Loll, MD    metoprolol tartrate (LOPRESSOR) 25 MG tablet Take 12.5 mg by mouth 2 (two) times daily. 07/03/16   [provider]  Multiple Vitamins-Minerals (MULTIVITAMIN ADULTS PO) Take 1 tablet by mouth daily.     [provider]  OLANZapine (ZYPREXA) 5 MG tablet Take 5 mg by mouth every evening.  04/26/16   [provider]  polyethylene glycol (MIRALAX / GLYCOLAX) packet Take 17 g by mouth 2 (two) times daily. Patient not taking: Reported on 11/12/2016 09/24/16   Lanney Gins, PA-C  sertraline (ZOLOFT) 50 MG tablet Take 0.5 tablets (25 mg total) by mouth every morning. 11/16/16   Vassie Loll, MD    Family History Family History  Problem Relation Age of Onset  . Stroke Mother        dead  . Clotting disorder Father        dead  bone marrow disease    Social History Social History  Substance Use Topics  . Smoking status: Former Smoker    Packs/day: 0.50    Types:  Cigarettes    Quit date: 04/11/2015  . Smokeless tobacco: Never Used  . Alcohol use 1.2 oz/week    2 Glasses of wine per week     Allergies   Patient has no known allergies.   Review of Systems Review of Systems  Constitutional: Negative for chills, fatigue and fever.  HENT: Negative for congestion and rhinorrhea.   Eyes: Negative for visual disturbance.  Respiratory: Negative for cough, chest tightness, shortness of breath, wheezing and stridor.   Cardiovascular: Negative for chest pain and palpitations.  Gastrointestinal: Positive for abdominal pain. Negative for diarrhea, nausea and vomiting.  Genitourinary: Negative for dysuria and flank pain.  Musculoskeletal: Positive for back pain and neck pain. Negative for neck stiffness.  Skin: Positive for wound. Negative for rash.  Neurological: Positive for headaches. Negative for weakness, light-headedness and numbness.  Psychiatric/Behavioral: Negative for agitation.  All other systems reviewed and are negative.    Physical Exam Updated  Vital Signs BP (!) 167/99 (BP Location: Left Arm)   Pulse 92   Temp 98 F (36.7 C) (Oral)   Resp (!) 23   Ht 5\' 1"  (1.549 m)   Wt 49.4 kg (109 lb)   SpO2 98%   BMI 20.60 kg/m   Physical Exam  Constitutional: She is oriented to person, place, and time. She appears well-developed and well-nourished. No distress.  HENT:  Head: Head is with laceration.    Nose: Nose normal.  Mouth/Throat: Oropharynx is clear and moist. No oropharyngeal exudate.  Patient has one severe laceration to posterior scalp that is hemostatic on arrival.  Eyes: Pupils are equal, round, and reactive to light. Conjunctivae and EOM are normal.  Neck:  Patient in cervical immobilization collar on arrival.  Cardiovascular: Normal rate and intact distal pulses.   No murmur heard. Pulmonary/Chest: Effort normal and breath sounds normal. No stridor. No respiratory distress. She has no wheezes. She exhibits no tenderness.  Abdominal: There is generalized tenderness. There is no CVA tenderness.    Musculoskeletal: She exhibits tenderness. She exhibits no edema.       Cervical back: She exhibits tenderness.       Thoracic back: She exhibits tenderness.       Lumbar back: She exhibits tenderness.       Back:  Neurological: She is alert and oriented to person, place, and time. She is not disoriented. She displays no tremor. No cranial nerve deficit or sensory deficit. She exhibits normal muscle tone. Coordination normal. GCS eye subscore is 4. GCS verbal subscore is 5. GCS motor subscore is 6.  Skin: Skin is warm and dry. Capillary refill takes less than 2 seconds. No rash noted. She is not diaphoretic. No erythema.  Psychiatric: She has a normal mood and affect.  Nursing note and vitals reviewed.    ED Treatments / Results  Labs (all labs ordered are listed, but only abnormal results are displayed) Labs Reviewed  CBC WITH DIFFERENTIAL/PLATELET - Abnormal; Notable for the following:       Result Value   WBC  11.7 (*)    HCT 35.6 (*)    Neutro Abs 9.2 (*)    All other components within normal limits  COMPREHENSIVE METABOLIC PANEL - Abnormal; Notable for the following:    Sodium 132 (*)    Glucose, Bld 107 (*)    BUN 21 (*)    Creatinine, Ser 1.14 (*)    GFR calc non Af Amer 44 (*)    GFR calc Af Denyse Dago  51 (*)    All other components within normal limits  URINALYSIS, ROUTINE W REFLEX MICROSCOPIC - Abnormal; Notable for the following:    Protein, ur TRACE (*)    Leukocytes, UA MODERATE (*)    Squamous Epithelial / LPF 0-5 (*)    All other components within normal limits  CULTURE, BLOOD (ROUTINE X 2)  CULTURE, BLOOD (ROUTINE X 2)  URINE CULTURE  PROTIME-INR  LIPASE, BLOOD  APTT  LACTIC ACID, PLASMA  LACTIC ACID, PLASMA  PROCALCITONIN  BASIC METABOLIC PANEL  CBC  I-STAT CG4 LACTIC ACID, ED  TYPE AND SCREEN    EKG  EKG Interpretation None       Radiology Ct Head Wo Contrast  Result Date: 12/29/2016 CLINICAL DATA:  Slipped and fell, headache, trauma EXAM: CT HEAD WITHOUT CONTRAST CT CERVICAL SPINE WITHOUT CONTRAST TECHNIQUE: Multidetector CT imaging of the head and cervical spine was performed following the standard protocol without intravenous contrast. Multiplanar CT image reconstructions of the cervical spine were also generated. COMPARISON:  11/12/2016 FINDINGS: CT HEAD FINDINGS Brain: No acute territorial infarction, hemorrhage, or intracranial mass is seen. Atrophy and small vessel ischemic changes of the white matter. 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 Old left greater than right basal ganglial infarcts. Stable ventricle size. Vascular: No hyperdense vessels.  Carotid artery calcifications. Skull: No fracture or suspicious bone lesion. Sinuses/Orbits: No acute finding. Other: Moderate left posterior scalp laceration and hematoma. CT CERVICAL SPINE FINDINGS Alignment: Trace 2 mm anterolisthesis of C3 on C4 and C7 on T1. Straightening of the cervical spine. Facet alignment is  within normal limits. Skull base and vertebrae: Craniovertebral junction is intact. No displaced fracture visible. Soft tissues and spinal canal: No prevertebral fluid or swelling. No visible canal hematoma. Disc levels: Anterior plate and screw fixation from C5 through C7 with similar appearance of the hardware. Mild degenerative changes at C4-C5 and C7-T1. Upper chest: Apical emphysema and fibrosis/ scarring in the right upper lobe. Carotid artery calcification. Other: None IMPRESSION: 1. No definite CT evidence for acute intracranial abnormality. Small vessel ischemic changes of the white matter and old appearing infarcts. 2. Trace anterolisthesis of C3 on C4 and C7 on T1. No fracture is seen. Status post anterior plate and screw fixation from C5 through C7. 3. Apical emphysema and fibrosis in the right upper lobe Electronically Signed   By: Jasmine Pang M.D.   On: 12/29/2016 21:37   Ct Chest W Contrast  Result Date: 12/29/2016 CLINICAL DATA:  82 year old post fall in kitchen with abdominal tenderness and thoraco lumbar spine tenderness. EXAM: CT CHEST, ABDOMEN, AND PELVIS WITH CONTRAST TECHNIQUE: Multidetector CT imaging of the chest, abdomen and pelvis was performed following the standard protocol during bolus administration of intravenous contrast. CONTRAST:  80 cc Isovue-300 IV COMPARISON:  CT angiography of the chest, abdomen, and pelvis 11/15/2016 FINDINGS: CT CHEST FINDINGS Cardiovascular: No acute aortic injury. Moderate aortic atherosclerosis. Post CABG with calcifications of native coronary artery is. The heart is normal in size. Mediastinum/Nodes: No mediastinal hemorrhage or hematoma. No pneumomediastinum. The esophagus is decompressed. No adenopathy. Lungs/Pleura: No pneumothorax. No consolidation to suggest pulmonary contusion. Again seen emphysema and basilar honeycombing consistent with fibrosis. Multifocal areas of scarring, including subpleural nodularity in the right upper lobe is stable.  Biapical pleuroparenchymal scarring. No new consolidation. No pleural fluid. Trachea and mainstem bronchi are patent. Musculoskeletal: Mild superior endplate T9 compression fracture is acute. There is minimal involvement of the posterior cortex without significant mass effect on the  spinal canal. No posterior element extension. No additional fracture of the thoracic spine. Remote posterior right seventh rib fracture. No acute rib fracture. Post median sternotomy. No acute sternal fracture. Included shoulder girdles and clavicles are intact. CT ABDOMEN PELVIS FINDINGS Hepatobiliary: No hepatic injury or perihepatic hematoma. Gallbladder is unremarkable Pancreas: No pancreatic injury. Prominent pancreatic duct in the body and tail measuring 4 mm is grossly unchanged from prior exam. Homogeneous pancreatic enhancement. Spleen: No splenic injury or perisplenic hematoma. Adrenals/Urinary Tract: No adrenal hemorrhage or renal injury identified. Left greater than right renal atrophy and parenchymal scarring. Bladder is unremarkable. Stomach/Bowel: No evidence of bowel injury or mesenteric hematoma. Colonic diverticulosis without acute inflammation. Appendix not visualized. Vascular/Lymphatic: Infrarenal abdominal aortic aneurysm, with some increase from prior exam currently 6.0 x 5.9 cm, previously 5.6 x 5.2 cm. No periaortic stranding to suggest rupture. Extensive aortic atherosclerosis of mural thrombus are unchanged. No acute vascular injury. No retroperitoneal fluid or stranding. No adenopathy. Reproductive: Status post hysterectomy. No adnexal masses. Other: No free air, free fluid, or intra-abdominal fluid collection. Musculoskeletal: No fracture of the bony pelvis or lumbar spine. Left hip pinning is again seen. No confluent body wall contusion. IMPRESSION: 1. Acute T9 compression fracture with approximately 40% loss of height anteriorly. There is mild involvement of the posterior cortex with minimal retropulsion  but no significant mass effect on the spinal canal. 2. No additional acute traumatic injury to the chest, abdomen, or pelvis. 3. Infrarenal abdominal aortic aneurysm has increased since exam 6 weeks prior, current maximal dimension 6.0 cm, previously 5.6 cm. No periaortic stranding to suggest rupture or acute injury. Recommend vascular surgery consultation. 4. Chronic lung findings of emphysema, multifocal scarring and basilar fibrosis. Electronically Signed   By: Rubye Oaks M.D.   On: 12/29/2016 21:35   Ct Cervical Spine Wo Contrast  Result Date: 12/29/2016 CLINICAL DATA:  Slipped and fell, headache, trauma EXAM: CT HEAD WITHOUT CONTRAST CT CERVICAL SPINE WITHOUT CONTRAST TECHNIQUE: Multidetector CT imaging of the head and cervical spine was performed following the standard protocol without intravenous contrast. Multiplanar CT image reconstructions of the cervical spine were also generated. COMPARISON:  11/12/2016 FINDINGS: CT HEAD FINDINGS Brain: No acute territorial infarction, hemorrhage, or intracranial mass is seen. Atrophy and small vessel ischemic changes of the white matter. 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 Old left greater than right basal ganglial infarcts. Stable ventricle size. Vascular: No hyperdense vessels.  Carotid artery calcifications. Skull: No fracture or suspicious bone lesion. Sinuses/Orbits: No acute finding. Other: Moderate left posterior scalp laceration and hematoma. CT CERVICAL SPINE FINDINGS Alignment: Trace 2 mm anterolisthesis of C3 on C4 and C7 on T1. Straightening of the cervical spine. Facet alignment is within normal limits. Skull base and vertebrae: Craniovertebral junction is intact. No displaced fracture visible. Soft tissues and spinal canal: No prevertebral fluid or swelling. No visible canal hematoma. Disc levels: Anterior plate and screw fixation from C5 through C7 with similar appearance of the hardware. Mild degenerative changes at C4-C5 and C7-T1. Upper  chest: Apical emphysema and fibrosis/ scarring in the right upper lobe. Carotid artery calcification. Other: None IMPRESSION: 1. No definite CT evidence for acute intracranial abnormality. Small vessel ischemic changes of the white matter and old appearing infarcts. 2. Trace anterolisthesis of C3 on C4 and C7 on T1. No fracture is seen. Status post anterior plate and screw fixation from C5 through C7. 3. Apical emphysema and fibrosis in the right upper lobe Electronically Signed   By: Selena Batten  Jake Samples M.D.   On: 12/29/2016 21:37   Ct Abdomen Pelvis W Contrast  Result Date: 12/29/2016 CLINICAL DATA:  82 year old post fall in kitchen with abdominal tenderness and thoraco lumbar spine tenderness. EXAM: CT CHEST, ABDOMEN, AND PELVIS WITH CONTRAST TECHNIQUE: Multidetector CT imaging of the chest, abdomen and pelvis was performed following the standard protocol during bolus administration of intravenous contrast. CONTRAST:  80 cc Isovue-300 IV COMPARISON:  CT angiography of the chest, abdomen, and pelvis 11/15/2016 FINDINGS: CT CHEST FINDINGS Cardiovascular: No acute aortic injury. Moderate aortic atherosclerosis. Post CABG with calcifications of native coronary artery is. The heart is normal in size. Mediastinum/Nodes: No mediastinal hemorrhage or hematoma. No pneumomediastinum. The esophagus is decompressed. No adenopathy. Lungs/Pleura: No pneumothorax. No consolidation to suggest pulmonary contusion. Again seen emphysema and basilar honeycombing consistent with fibrosis. Multifocal areas of scarring, including subpleural nodularity in the right upper lobe is stable. Biapical pleuroparenchymal scarring. No new consolidation. No pleural fluid. Trachea and mainstem bronchi are patent. Musculoskeletal: Mild superior endplate T9 compression fracture is acute. There is minimal involvement of the posterior cortex without significant mass effect on the spinal canal. No posterior element extension. No additional fracture of  the thoracic spine. Remote posterior right seventh rib fracture. No acute rib fracture. Post median sternotomy. No acute sternal fracture. Included shoulder girdles and clavicles are intact. CT ABDOMEN PELVIS FINDINGS Hepatobiliary: No hepatic injury or perihepatic hematoma. Gallbladder is unremarkable Pancreas: No pancreatic injury. Prominent pancreatic duct in the body and tail measuring 4 mm is grossly unchanged from prior exam. Homogeneous pancreatic enhancement. Spleen: No splenic injury or perisplenic hematoma. Adrenals/Urinary Tract: No adrenal hemorrhage or renal injury identified. Left greater than right renal atrophy and parenchymal scarring. Bladder is unremarkable. Stomach/Bowel: No evidence of bowel injury or mesenteric hematoma. Colonic diverticulosis without acute inflammation. Appendix not visualized. Vascular/Lymphatic: Infrarenal abdominal aortic aneurysm, with some increase from prior exam currently 6.0 x 5.9 cm, previously 5.6 x 5.2 cm. No periaortic stranding to suggest rupture. Extensive aortic atherosclerosis of mural thrombus are unchanged. No acute vascular injury. No retroperitoneal fluid or stranding. No adenopathy. Reproductive: Status post hysterectomy. No adnexal masses. Other: No free air, free fluid, or intra-abdominal fluid collection. Musculoskeletal: No fracture of the bony pelvis or lumbar spine. Left hip pinning is again seen. No confluent body wall contusion. IMPRESSION: 1. Acute T9 compression fracture with approximately 40% loss of height anteriorly. There is mild involvement of the posterior cortex with minimal retropulsion but no significant mass effect on the spinal canal. 2. No additional acute traumatic injury to the chest, abdomen, or pelvis. 3. Infrarenal abdominal aortic aneurysm has increased since exam 6 weeks prior, current maximal dimension 6.0 cm, previously 5.6 cm. No periaortic stranding to suggest rupture or acute injury. Recommend vascular surgery  consultation. 4. Chronic lung findings of emphysema, multifocal scarring and basilar fibrosis. Electronically Signed   By: Rubye Oaks M.D.   On: 12/29/2016 21:35    Procedures Procedures (including critical care time)  Medications Ordered in ED Medications  morphine 2 MG/ML injection 1 mg (not administered)  polyethylene glycol (MIRALAX / GLYCOLAX) packet 17 g (not administered)  aspirin EC tablet 81 mg (not administered)  fluticasone (FLONASE) 50 MCG/ACT nasal spray 1 spray (not administered)  sertraline (ZOLOFT) tablet 25 mg (not administered)  metoprolol tartrate (LOPRESSOR) tablet 12.5 mg (not administered)  MULTIVITAMIN ADULTS TABS (not administered)  OLANZapine (ZYPREXA) tablet 5 mg (not administered)  loratadine (CLARITIN) tablet 10 mg (not administered)  atorvastatin (LIPITOR) tablet 40 mg (  not administered)  cefTRIAXone (ROCEPHIN) 1 g in dextrose 5 % 50 mL IVPB (not administered)  ondansetron (ZOFRAN) injection 4 mg (not administered)  oxyCODONE-acetaminophen (PERCOCET/ROXICET) 5-325 MG per tablet 1 tablet (not administered)  methocarbamol (ROBAXIN) tablet 500 mg (not administered)  sodium chloride 0.9 % bolus 1,000 mL (not administered)  0.9 %  sodium chloride infusion (not administered)  albuterol (PROVENTIL) (2.5 MG/3ML) 0.083% nebulizer solution 2.5 mg (not administered)  acetaminophen (TYLENOL) tablet 650 mg (not administered)    Or  acetaminophen (TYLENOL) suppository 650 mg (not administered)  zolpidem (AMBIEN) tablet 5 mg (not administered)  morphine 4 MG/ML injection 4 mg (4 mg Intravenous Given 12/29/16 1932)  morphine 4 MG/ML injection 4 mg (4 mg Intravenous Given 12/29/16 2037)  iopamidol (ISOVUE-300) 61 % injection (80 mLs Intravenous Contrast Given 12/29/16 2041)  bacitracin 500 UNIT/GM ointment (1 application  Given 12/30/16 0023)     Initial Impression / Assessment and Plan / ED Course  I have reviewed the triage vital signs and the nursing  notes.  Pertinent labs & imaging results that were available during my care of the patient were reviewed by me and considered in my medical decision making (see chart for details).     Tonya Payne is a 82 y.o. female with a past medical history significant for CAD with CABG, AAA, atrial fibrillation, COPD, chronic kidney disease, CHF, and hypertension who presents with mechanical fall and subsequent pain to her head with laceration, neck, back, and abdomen. Patient reports that she had a mechanical fall today in the kitchen and could not reach her walker before stumbling. She reports that she hit her head on a table. She did not lose consciousness but reported immediate onset of pain and bleeding on the back of her head. She also reported having severe pain in her mid back, neck, and abdomen. She denies other traumatic injuries. She describes her pain as moderate to severe in her back and abdomen. Her bleeding was controlled with pressure for her scalp laceration. Patient came to the emergency for evaluation.  Patient denies any chest pain or shortness of breath. She denies any nausea, vomiting, conservation, diarrhea, or dysuria. She denies any leg pain or leg weakness. She denies loss of bowel or bladder control. She does not take blood thinners.  On exam, patient had tenderness in her lumbar spine, thoracic spine, and neck. Patient was most tender in the thoracic area. Patient's abdomen was also was grossly tender to palpation all across the abdomen. Lungs were clear and chest was nontender. No focal neurologic deficits appreciated. Patient had a 170 or scalp laceration. Patient arrived in a cervical mobilization collar.  Due to the tenderness in multiple locations in the patient as well as her history of AAA, patient had CT scanning to look for to medical injuries. CT scan revealed new thoracic compression fracture. Patient also found to have significant growth of her AAA in the last several weeks  up to 6 m in maximum diameter currently. Patient had no evidence of intracranial injury or cervical spine fracture.  Cervical collar removed and laceration was stapled at the bedside after extensive washing.  Vascular surgery was called due to the growing aortic aneurysm and her significant abdominal tenderness. They recommended patient be admitted to hospitalist service at [facility] so they can see the patient in the morning for determination of planning. They do not feel that the patient's CT scan showed dissection, rupture, or other aortic problem tonight. They did not recommend  heparin or other medications at this time.  Do not feel patient would be appropriate for a thoracic back brace at this time as it would hamper abdominal reassessment due to the pain. After vascular evaluation, patient may need to speak with spine team to determine bracing needs.  Hospitalist team called and Patient will be admitted to [facility] for further pain management for her thoracic spine fracture and her growing AAA with tenderness. Patient admitted in stable condition.   Final Clinical Impressions(s) / ED Diagnoses   Final diagnoses:  Closed compression fracture of thoracic vertebra, initial encounter (HCC)  Fall, initial encounter  Laceration of scalp, initial encounter  Generalized abdominal pain  Abdominal aortic aneurysm (AAA) without rupture (HCC)    Clinical Impression: 1. Closed compression fracture of thoracic vertebra, initial encounter (HCC)   2. Fall, initial encounter   3. Laceration of scalp, initial encounter   4. Generalized abdominal pain   5. Abdominal aortic aneurysm (AAA) without rupture [facility])     Disposition: Admit to Hospitalst service    Tegeler, Canary Brim, MD 12/30/16 1022

## 2016-12-29 NOTE — H&P (Signed)
History and Physical    Tonya Payne HYI:[id number] DOB: Aug 21, 1934 DOA: 12/29/2016  Referring MD/NP/PA:   PCP: Rocky Morel, MD   Patient coming from:  The patient is coming from home.  At baseline, pt is independent for most of ADL.  Chief Complaint: fall, back pain, abdominal pain, burning on urination and increased urinary frequency  HPI: Tonya Payne is a 82 y.o. female with medical history significant of hyperlipidemia, COPD, TIA, depression, anxiety, CAD, CABG, IBS, AAA, CKD-3, who presents with fall, back pain, abdominal pain, burning on urination and increased urinary frequency.  Pt states that she was in the kitchen, slipped, fell, hit her head on the corner part of wall. She had a scalp laceration and hematoma in the occipital area. Pt reports that she developed pain in mid of her back, which is constant, 6 out of 10 in severity, sharp, nonradiating. No numbness or weakness in legs. She also reports abdominal pain, which is diffuse, constant, sharp, 5 out of 10 in severity. No nausea, vomiting, diarrhea. No fever or chills. Patient also has burning on urination and increased urinary frequency, but no dysuria. She denies chest pain, shortness breath, cough. No unilateral weakness. She states that she drinks one glass is 1 every day before going to sleep.  ED Course: pt was found to have positive urinalysis with large amount of leukocyte, WBC 11.7, lactic acid 0.91, INR 1.02, creatinine 1.14, temperature normal, tachycardia, tachypnea, oxygen saturation 99% on room air. Patient is admitted to telemetry bed as inpatient. Vascular surgery was consulted.  # CT-abdomen/pelvis and CT-chest: 1. Acute T9 compression fracture with approximately 40% loss of height anteriorly. There is mild involvement of the posterior cortex with minimal retropulsion but no significant mass effect on the spinal canal. 2. No additional acute traumatic injury to the chest, abdomen, or pelvis. 3. Infrarenal  abdominal aortic aneurysm has increased since exam 6 weeks prior, current maximal dimension 6.0 cm, previously 5.6 cm. No periaortic stranding to suggest rupture or acute injury. 4. Chronic lung findings of emphysema, multifocal scarring and basilar fibrosis.  # CT-head and c spin: 1. No definite CT evidence for acute intracranial abnormality. Small vessel ischemic changes of the white matter and old appearing infarcts. 2. Trace anterolisthesis of C3 on C4 and C7 on T1. No fracture is seen. Status post anterior plate and screw fixation from C5 through C7. 3. Apical emphysema and fibrosis in the right upper lobe  Review of Systems:   General: no fevers, chills, no body weight gain, has poor appetite, has fatigue HEENT: no blurry vision, hearing changes or sore throat Respiratory: no dyspnea, coughing, wheezing CV: no chest pain, no palpitations GI: no nausea, vomiting, has abdominal pain, no diarrhea, constipation GU: no dysuria, burning on urination, increased urinary frequency, hematuria  Ext: no leg edema Neuro: no unilateral weakness, numbness, or tingling, no vision change or hearing loss Skin: has hematoma and scalp laceration in the occipital area MSK: has mid back pain Heme: No easy bruising.  Travel history: No recent long distant travel.  Allergy: No Known Allergies  Past Medical History:  Diagnosis Date  . AAA (abdominal aortic aneurysm) without rupture (HCC) 2016   CTA 10/23/2015 - saccular infrarenal aneurysm roughly 4.8 cm in diameter. Stable from 2016 -- Dr. Darrick Penna  . Anxiety   . Atrial fibrillation, new onset (HCC) 10/2015   after CABG, in SR at d/c  . CAD (coronary artery disease), native coronary artery 10/08/2015   Severe coronary disease with calcified  left main and LAD: 5% left main going into ostial LAD. Mid LAD 95% (treated with PTCA). Proximal RCA 50%, calcified. EF was 20-25% with apical akinesis and distal anterior hypokinesis. --> Referred for CABG  .  Congestive dilated cardiomyopathy (HCC) 10/08/2015   Intra-Op TEE showed EF of 40 and 45%. She had an echo with a "normal LV function "documented, but there was no reading M.D.  . COPD (chronic obstructive pulmonary disease) (HCC)   . Depression   . History of acute anterior wall MI 10/08/2015   Presented with severe chest pain and dynamic anterior ST elevations/biphasic elevations area did not meet full criteria for STEMI, and troponin was not dramatically elevated. 95% LAD treated with PTCA followed by CABG  . Hyperlipidemia LDL goal <70 10/08/2015  . IBS (irritable bowel syndrome)   . Oral thrush   . Post PTCA 10/08/2015   Presented with STEMI, severe mid LAD lesion treated with PTCA and then referred for CABG.  . TIA (transient ischemic attack)     Past Surgical History:  Procedure Laterality Date  . BACK SURGERY    . CARDIAC CATHETERIZATION N/A 10/06/2015   Procedure: Left Heart Cath and Coronary Angiography;  Surgeon: Marykay Lex, MD;  Location: [facility] INVASIVE CV LAB;  Service: Cardiovascular;: Heavily calcified left main-LAD. 75% LM & Ost LAD, 95% d-mLAD, p-mRCA Calcified 50%. EF ~20-25%  . CARDIAC CATHETERIZATION N/A 10/06/2015   Procedure: Coronary Balloon Angioplasty;  Surgeon: Marykay Lex, MD;  Location: [facility] INVASIVE CV LAB;  Service: Cardiovascular: PTCA of 95% LAD in setting of STEMI - reduced to 70% --> would need Atherectomy-PCI vs. CABG.  Sent for CABG.  . COLECTOMY    . CORONARY ARTERY BYPASS GRAFT N/A 10/16/2015   Procedure: CORONARY ARTERY BYPASS GRAFTING (CABG)  x three, using left internal mammary artery and right leg greater saphenous vein harvested endoscopically;  Surgeon: Loreli Slot, MD;  Location: [facility] OR;  Service: Open Heart Surgery;  Laterality: N/A;  . HIP PINNING,CANNULATED Left 09/24/2016   Procedure: CANNULATED HIP PINNING LEFT HIP;  Surgeon: Durene Romans, MD;  Location: [facility] ORS;  Service: Orthopedics;  Laterality: Left;  . NECK SURGERY    . TEE WITHOUT  CARDIOVERSION N/A 10/16/2015   Procedure: TRANSESOPHAGEAL ECHOCARDIOGRAM (TEE);  Surgeon: Loreli Slot, MD;  Location: [facility] OR;  Service: Open Heart Surgery - IntraOp:  EF 40-45%.    Social History:  reports that she quit smoking about 20 months ago. Her smoking use included Cigarettes. She smoked 0.50 packs per day. She has never used smokeless tobacco. She reports that she drinks about 1.2 oz of alcohol per week . She reports that she does not use drugs.  Family History:  Family History  Problem Relation Age of Onset  . Stroke Mother        dead  . Clotting disorder Father        dead  bone marrow disease     Prior to Admission medications   Medication Sig Start Date End Date Taking? Authorizing Provider  aspirin EC 81 MG tablet Take 81 mg by mouth daily.   Yes [provider]  atorvastatin (LIPITOR) 40 MG tablet Take 1 tablet (40 mg total) by mouth daily at 6 PM. Patient taking differently: Take 40 mg by mouth daily.  12/18/15  Yes Barrett, Erin R, PA-C  feeding supplement, ENSURE ENLIVE, (ENSURE ENLIVE) LIQD Take 237 mLs by mouth 2 (two) times daily between meals. Patient taking differently: Take 237 mLs  by mouth daily.  09/26/16  Yes Sheikh, Omair Latif, DO  fexofenadine (ALLEGRA) 180 MG tablet Take 180 mg by mouth every evening.    Yes [provider]  fluticasone (FLONASE) 50 MCG/ACT nasal spray Place 1 spray into both nostrils daily. Patient taking differently: Place 1 spray into both nostrils daily as needed for allergies.  11/16/16  Yes Vassie Loll, MD  megestrol (MEGACE) 40 MG/ML suspension Take 800 mg by mouth daily as needed for other. appetite 11/29/16  Yes [provider]  metoprolol tartrate (LOPRESSOR) 25 MG tablet Take 12.5 mg by mouth 2 (two) times daily. 07/03/16  Yes [provider]  Multiple Vitamins-Minerals (MULTIVITAMIN ADULTS PO) Take 1 tablet by mouth daily.    Yes [provider]  OLANZapine (ZYPREXA) 5 MG tablet  Take 5 mg by mouth every evening.  04/26/16  Yes [provider]  sertraline (ZOLOFT) 50 MG tablet Take 0.5 tablets (25 mg total) by mouth every morning. 11/16/16  Yes Vassie Loll, MD  traMADol (ULTRAM) 50 MG tablet Take 50 mg by mouth daily as needed for pain. 12/24/16  Yes [provider]  docusate sodium (COLACE) 100 MG capsule Take 1 capsule (100 mg total) by mouth 2 (two) times daily. Patient not taking: Reported on 11/12/2016 09/24/16   Lanney Gins, PA-C  polyethylene glycol (MIRALAX / GLYCOLAX) packet Take 17 g by mouth 2 (two) times daily. Patient not taking: Reported on 11/12/2016 09/24/16   Lanney Gins, PA-C    Physical Exam: Vitals:   12/30/16 0330 12/30/16 0400 12/30/16 0430 12/30/16 0500  BP: 106/74 (!) 148/93 (!) 153/95 (!) 162/89  Pulse: 95 97 93 94  Resp: 19 12 18 15   Temp:      TempSrc:      SpO2: 92% 92% 94% 94%  Weight:      Height:       General: Not in acute distress HEENT:       Eyes: PERRL, EOMI, no scleral icterus.       ENT: No discharge from the ears and nose, no pharynx injection, no tonsillar enlargement.        Neck: No JVD, no bruit, no mass felt. Heme: No neck lymph node enlargement. Cardiac: S1/S2, RRR, No murmurs, No gallops or rubs. Respiratory: No rales, wheezing, rhonchi or rubs. GI: Soft, nondistended, has tenderness and guarding, no rebound pain, no organomegaly, BS present. GU: No hematuria Ext: No pitting leg edema bilaterally. 2+DP/PT pulse bilaterally. Musculoskeletal: has tenderness in the upper and mid back. Skin:  has hematoma and scalp laceration in the occipital area Neuro: Alert, oriented X3, cranial nerves II-XII grossly intact, moves all extremities normally.  Psych: Patient is not psychotic, no suicidal or hemocidal ideation.  Labs on Admission: I have personally reviewed following labs and imaging studies  CBC:  Recent Labs Lab 12/29/16 1929  WBC 11.7*  NEUTROABS 9.2*  HGB 12.4  HCT 35.6*  MCV  91.3  PLT 301   Basic Metabolic Panel:  Recent Labs Lab 12/29/16 1929  NA 132*  K 4.2  CL 101  CO2 22  GLUCOSE 107*  BUN 21*  CREATININE 1.14*  CALCIUM 9.2   GFR: Estimated Creatinine Clearance: 29.7 mL/min (A) (by C-G formula based on SCr of 1.14 mg/dL (H)). Liver Function Tests:  Recent Labs Lab 12/29/16 1929  AST 40  ALT 28  ALKPHOS 75  BILITOT 0.6  PROT 7.2  ALBUMIN 3.7    Recent Labs Lab 12/29/16 1929  LIPASE 28  No results for input(s): AMMONIA in the last 168 hours. Coagulation Profile:  Recent Labs Lab 12/29/16 1929  INR 1.02   Cardiac Enzymes: No results for input(s): CKTOTAL, CKMB, CKMBINDEX, TROPONINI in the last 168 hours. BNP (last 3 results) No results for input(s): PROBNP in the last 8760 hours. HbA1C: No results for input(s): HGBA1C in the last 72 hours. CBG: No results for input(s): GLUCAP in the last 168 hours. Lipid Profile: No results for input(s): CHOL, HDL, LDLCALC, TRIG, CHOLHDL, LDLDIRECT in the last 72 hours. Thyroid Function Tests: No results for input(s): TSH, T4TOTAL, FREET4, T3FREE, THYROIDAB in the last 72 hours. Anemia Panel: No results for input(s): VITAMINB12, FOLATE, FERRITIN, TIBC, IRON, RETICCTPCT in the last 72 hours. Urine analysis:    Component Value Date/Time   COLORURINE YELLOW 12/29/2016 1912   APPEARANCEUR CLEAR 12/29/2016 1912   LABSPEC 1.015 12/29/2016 1912   PHURINE 6.5 12/29/2016 1912   GLUCOSEU NEGATIVE 12/29/2016 1912   HGBUR NEGATIVE 12/29/2016 1912   BILIRUBINUR NEGATIVE 12/29/2016 1912   KETONESUR NEGATIVE 12/29/2016 1912   PROTEINUR TRACE (A) 12/29/2016 1912   UROBILINOGEN 0.2 02/25/2015 1826   NITRITE NEGATIVE 12/29/2016 1912   LEUKOCYTESUR MODERATE (A) 12/29/2016 1912   Sepsis Labs: @LABRCNTIP (procalcitonin:4,lacticidven:4) )No results found for this or any previous visit (from the past 240 hour(s)).   Radiological Exams on Admission: Ct Head Wo Contrast  Result Date:  12/29/2016 CLINICAL DATA:  Slipped and fell, headache, trauma EXAM: CT HEAD WITHOUT CONTRAST CT CERVICAL SPINE WITHOUT CONTRAST TECHNIQUE: Multidetector CT imaging of the head and cervical spine was performed following the standard protocol without intravenous contrast. Multiplanar CT image reconstructions of the cervical spine were also generated. COMPARISON:  11/12/2016 FINDINGS: CT HEAD FINDINGS Brain: No acute territorial infarction, hemorrhage, or intracranial mass is seen. Atrophy and small vessel ischemic changes of the white matter. 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 Old left greater than right basal ganglial infarcts. Stable ventricle size. Vascular: No hyperdense vessels.  Carotid artery calcifications. Skull: No fracture or suspicious bone lesion. Sinuses/Orbits: No acute finding. Other: Moderate left posterior scalp laceration and hematoma. CT CERVICAL SPINE FINDINGS Alignment: Trace 2 mm anterolisthesis of C3 on C4 and C7 on T1. Straightening of the cervical spine. Facet alignment is within normal limits. Skull base and vertebrae: Craniovertebral junction is intact. No displaced fracture visible. Soft tissues and spinal canal: No prevertebral fluid or swelling. No visible canal hematoma. Disc levels: Anterior plate and screw fixation from C5 through C7 with similar appearance of the hardware. Mild degenerative changes at C4-C5 and C7-T1. Upper chest: Apical emphysema and fibrosis/ scarring in the right upper lobe. Carotid artery calcification. Other: None IMPRESSION: 1. No definite CT evidence for acute intracranial abnormality. Small vessel ischemic changes of the white matter and old appearing infarcts. 2. Trace anterolisthesis of C3 on C4 and C7 on T1. No fracture is seen. Status post anterior plate and screw fixation from C5 through C7. 3. Apical emphysema and fibrosis in the right upper lobe Electronically Signed   By: Jasmine Pang M.D.   On: 12/29/2016 21:37   Ct Chest W  Contrast  Result Date: 12/29/2016 CLINICAL DATA:  83 year old post fall in kitchen with abdominal tenderness and thoraco lumbar spine tenderness. EXAM: CT CHEST, ABDOMEN, AND PELVIS WITH CONTRAST TECHNIQUE: Multidetector CT imaging of the chest, abdomen and pelvis was performed following the standard protocol during bolus administration of intravenous contrast. CONTRAST:  80 cc Isovue-300 IV COMPARISON:  CT angiography of the chest, abdomen,  and pelvis 11/15/2016 FINDINGS: CT CHEST FINDINGS Cardiovascular: No acute aortic injury. Moderate aortic atherosclerosis. Post CABG with calcifications of native coronary artery is. The heart is normal in size. Mediastinum/Nodes: No mediastinal hemorrhage or hematoma. No pneumomediastinum. The esophagus is decompressed. No adenopathy. Lungs/Pleura: No pneumothorax. No consolidation to suggest pulmonary contusion. Again seen emphysema and basilar honeycombing consistent with fibrosis. Multifocal areas of scarring, including subpleural nodularity in the right upper lobe is stable. Biapical pleuroparenchymal scarring. No new consolidation. No pleural fluid. Trachea and mainstem bronchi are patent. Musculoskeletal: Mild superior endplate T9 compression fracture is acute. There is minimal involvement of the posterior cortex without significant mass effect on the spinal canal. No posterior element extension. No additional fracture of the thoracic spine. Remote posterior right seventh rib fracture. No acute rib fracture. Post median sternotomy. No acute sternal fracture. Included shoulder girdles and clavicles are intact. CT ABDOMEN PELVIS FINDINGS Hepatobiliary: No hepatic injury or perihepatic hematoma. Gallbladder is unremarkable Pancreas: No pancreatic injury. Prominent pancreatic duct in the body and tail measuring 4 mm is grossly unchanged from prior exam. Homogeneous pancreatic enhancement. Spleen: No splenic injury or perisplenic hematoma. Adrenals/Urinary Tract: No adrenal  hemorrhage or renal injury identified. Left greater than right renal atrophy and parenchymal scarring. Bladder is unremarkable. Stomach/Bowel: No evidence of bowel injury or mesenteric hematoma. Colonic diverticulosis without acute inflammation. Appendix not visualized. Vascular/Lymphatic: Infrarenal abdominal aortic aneurysm, with some increase from prior exam currently 6.0 x 5.9 cm, previously 5.6 x 5.2 cm. No periaortic stranding to suggest rupture. Extensive aortic atherosclerosis of mural thrombus are unchanged. No acute vascular injury. No retroperitoneal fluid or stranding. No adenopathy. Reproductive: Status post hysterectomy. No adnexal masses. Other: No free air, free fluid, or intra-abdominal fluid collection. Musculoskeletal: No fracture of the bony pelvis or lumbar spine. Left hip pinning is again seen. No confluent body wall contusion. IMPRESSION: 1. Acute T9 compression fracture with approximately 40% loss of height anteriorly. There is mild involvement of the posterior cortex with minimal retropulsion but no significant mass effect on the spinal canal. 2. No additional acute traumatic injury to the chest, abdomen, or pelvis. 3. Infrarenal abdominal aortic aneurysm has increased since exam 6 weeks prior, current maximal dimension 6.0 cm, previously 5.6 cm. No periaortic stranding to suggest rupture or acute injury. Recommend vascular surgery consultation. 4. Chronic lung findings of emphysema, multifocal scarring and basilar fibrosis. Electronically Signed   By: Rubye Oaks M.D.   On: 12/29/2016 21:35   Ct Cervical Spine Wo Contrast  Result Date: 12/29/2016 CLINICAL DATA:  Slipped and fell, headache, trauma EXAM: CT HEAD WITHOUT CONTRAST CT CERVICAL SPINE WITHOUT CONTRAST TECHNIQUE: Multidetector CT imaging of the head and cervical spine was performed following the standard protocol without intravenous contrast. Multiplanar CT image reconstructions of the cervical spine were also generated.  COMPARISON:  11/12/2016 FINDINGS: CT HEAD FINDINGS Brain: No acute territorial infarction, hemorrhage, or intracranial mass is seen. Atrophy and small vessel ischemic changes of the white matter. 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 Old left greater than right basal ganglial infarcts. Stable ventricle size. Vascular: No hyperdense vessels.  Carotid artery calcifications. Skull: No fracture or suspicious bone lesion. Sinuses/Orbits: No acute finding. Other: Moderate left posterior scalp laceration and hematoma. CT CERVICAL SPINE FINDINGS Alignment: Trace 2 mm anterolisthesis of C3 on C4 and C7 on T1. Straightening of the cervical spine. Facet alignment is within normal limits. Skull base and vertebrae: Craniovertebral junction is intact. No displaced fracture visible. Soft tissues and spinal canal:  No prevertebral fluid or swelling. No visible canal hematoma. Disc levels: Anterior plate and screw fixation from C5 through C7 with similar appearance of the hardware. Mild degenerative changes at C4-C5 and C7-T1. Upper chest: Apical emphysema and fibrosis/ scarring in the right upper lobe. Carotid artery calcification. Other: None IMPRESSION: 1. No definite CT evidence for acute intracranial abnormality. Small vessel ischemic changes of the white matter and old appearing infarcts. 2. Trace anterolisthesis of C3 on C4 and C7 on T1. No fracture is seen. Status post anterior plate and screw fixation from C5 through C7. 3. Apical emphysema and fibrosis in the right upper lobe Electronically Signed   By: Jasmine Pang M.D.   On: 12/29/2016 21:37   Ct Abdomen Pelvis W Contrast  Result Date: 12/29/2016 CLINICAL DATA:  82 year old post fall in kitchen with abdominal tenderness and thoraco lumbar spine tenderness. EXAM: CT CHEST, ABDOMEN, AND PELVIS WITH CONTRAST TECHNIQUE: Multidetector CT imaging of the chest, abdomen and pelvis was performed following the standard protocol during bolus administration of intravenous  contrast. CONTRAST:  80 cc Isovue-300 IV COMPARISON:  CT angiography of the chest, abdomen, and pelvis 11/15/2016 FINDINGS: CT CHEST FINDINGS Cardiovascular: No acute aortic injury. Moderate aortic atherosclerosis. Post CABG with calcifications of native coronary artery is. The heart is normal in size. Mediastinum/Nodes: No mediastinal hemorrhage or hematoma. No pneumomediastinum. The esophagus is decompressed. No adenopathy. Lungs/Pleura: No pneumothorax. No consolidation to suggest pulmonary contusion. Again seen emphysema and basilar honeycombing consistent with fibrosis. Multifocal areas of scarring, including subpleural nodularity in the right upper lobe is stable. Biapical pleuroparenchymal scarring. No new consolidation. No pleural fluid. Trachea and mainstem bronchi are patent. Musculoskeletal: Mild superior endplate T9 compression fracture is acute. There is minimal involvement of the posterior cortex without significant mass effect on the spinal canal. No posterior element extension. No additional fracture of the thoracic spine. Remote posterior right seventh rib fracture. No acute rib fracture. Post median sternotomy. No acute sternal fracture. Included shoulder girdles and clavicles are intact. CT ABDOMEN PELVIS FINDINGS Hepatobiliary: No hepatic injury or perihepatic hematoma. Gallbladder is unremarkable Pancreas: No pancreatic injury. Prominent pancreatic duct in the body and tail measuring 4 mm is grossly unchanged from prior exam. Homogeneous pancreatic enhancement. Spleen: No splenic injury or perisplenic hematoma. Adrenals/Urinary Tract: No adrenal hemorrhage or renal injury identified. Left greater than right renal atrophy and parenchymal scarring. Bladder is unremarkable. Stomach/Bowel: No evidence of bowel injury or mesenteric hematoma. Colonic diverticulosis without acute inflammation. Appendix not visualized. Vascular/Lymphatic: Infrarenal abdominal aortic aneurysm, with some increase from  prior exam currently 6.0 x 5.9 cm, previously 5.6 x 5.2 cm. No periaortic stranding to suggest rupture. Extensive aortic atherosclerosis of mural thrombus are unchanged. No acute vascular injury. No retroperitoneal fluid or stranding. No adenopathy. Reproductive: Status post hysterectomy. No adnexal masses. Other: No free air, free fluid, or intra-abdominal fluid collection. Musculoskeletal: No fracture of the bony pelvis or lumbar spine. Left hip pinning is again seen. No confluent body wall contusion. IMPRESSION: 1. Acute T9 compression fracture with approximately 40% loss of height anteriorly. There is mild involvement of the posterior cortex with minimal retropulsion but no significant mass effect on the spinal canal. 2. No additional acute traumatic injury to the chest, abdomen, or pelvis. 3. Infrarenal abdominal aortic aneurysm has increased since exam 6 weeks prior, current maximal dimension 6.0 cm, previously 5.6 cm. No periaortic stranding to suggest rupture or acute injury. Recommend vascular surgery consultation. 4. Chronic lung findings of emphysema, multifocal scarring and  basilar fibrosis. Electronically Signed   By: Rubye Oaks M.D.   On: 12/29/2016 21:35     EKG: Independently reviewed.  Sinus rhythm, QTC 456, low voltage, poor R-wave progression.  Assessment/Plan Principal Problem:   AAA (abdominal aortic aneurysm) (HCC) Active Problems:   UTI (urinary tract infection)   Hyperlipidemia LDL goal <70   S/P CABG x 3   Fall   CKD (chronic kidney disease), stage III   COPD (chronic obstructive pulmonary disease) (HCC)   Closed compression fracture of thoracic vertebra (HCC)   Abdominal pain   Alcohol use   CAD (coronary artery disease)   AAA (abdominal aortic aneurysm) (HCC): CT showed infrarenal abdominal aortic aneurysm has increased since exam 6 weeks prior, current maximal dimension 6.0 cm, previously 5.6 cm. No periaortic stranding to suggest rupture or acute injury. VSS,  Dr. Imogene Burn was consulted-->Dr. Randie Heinz will see pt in AM.  -will admit to tele bed as inpt -pain control -f/u VVS recommendations  Fall and T9 compression fracture: Patient seems to have had a mechanical fall. No acute issues on CT-head and CT of C-spine. X Ray showed T9 compression fracture. -When necessary Percocet, morphine for pain -When necessary Robaxin for muscle spasm -PT/OT - may need to discuss with ortho  Abdominal pain: Pt has persistent abdominal pain, not sure if the increased size of AAA is the reason of her abdominal pain. She could have some abdominal wall injury from fall. Lipase negative. CT scan did not show acute injury intra-abdominally or in the pelvis. -Pain control: When necessary morphine and Percocet -prn Zofran for nausea  Sepsis due to UTI: Initially patient meets criteria for sepsis with tachycardia and tachypnea. Lactic acid is normal. Hemodynamically stable. -IV Rocephin -follow up blood culture and urine culture -will get Procalcitonin and trend lactic acid levels per sepsis protocol. -IVF: 1L of NS bolus in ED, followed by 100 cc/h   CAD: s/p of CABG; no CP -continue aspirin, Lipitor and metoprolol  HLD -lipitor  CKD (chronic kidney disease), stage III: stable. Cre is 1/14 -Follow up renal function.  COPD: stable. -When necessary albuterol nebulizers.  Alcohol use: Patient states that she drinks 1 classes 1 each night. No signs of withdrawal -CiWA protocol   DVT ppx: SCD Code Status: Full code (I discussed with patient about her CODE STATUS, and explained the meaning of CODE STATUS. She is very sure that she wanted be intubation if needed. She is not ready to make a decision about the CPR, therefore temporarily order full code now). Family Communication: None at bed side.      Disposition Plan:  Anticipate discharge back to previous home environment Consults called: VVS, Dr. Imogene Burn  Admission status:  Inpatient/tele   m   Date of Service  12/30/2016    Lorretta Harp [facility] Pager [phone]  If 7PM-7AM, please contact night-coverage [email or web address] Password [facility] 12/30/2016, 6:04 AM

## 2016-12-29 NOTE — ED Notes (Signed)
Hospitalist at bedside 

## 2016-12-29 NOTE — ED Triage Notes (Signed)
Per EMS, patient comes from home. She was in the kitchen and slipped and fell and hit her head on the corner part of wall. Laceration and hematoma noted, head wrapped and c-collar applied, spinal cord was cleared. No LOC, not on blood thinners, A&Ox4. Pt c/o mid/low back pain and head pain.

## 2016-12-29 NOTE — ED Notes (Signed)
Bed: WA19 Expected date:  Expected time:  Means of arrival:  Comments: EMS 

## 2016-12-30 DIAGNOSIS — W19XXXA Unspecified fall, initial encounter: Secondary | ICD-10-CM

## 2016-12-30 DIAGNOSIS — S22000A Wedge compression fracture of unspecified thoracic vertebra, initial encounter for closed fracture: Secondary | ICD-10-CM | POA: Diagnosis present

## 2016-12-30 DIAGNOSIS — E785 Hyperlipidemia, unspecified: Secondary | ICD-10-CM

## 2016-12-30 DIAGNOSIS — N3000 Acute cystitis without hematuria: Secondary | ICD-10-CM

## 2016-12-30 DIAGNOSIS — J439 Emphysema, unspecified: Secondary | ICD-10-CM

## 2016-12-30 DIAGNOSIS — R109 Unspecified abdominal pain: Secondary | ICD-10-CM

## 2016-12-30 DIAGNOSIS — J449 Chronic obstructive pulmonary disease, unspecified: Secondary | ICD-10-CM | POA: Diagnosis present

## 2016-12-30 DIAGNOSIS — Z789 Other specified health status: Secondary | ICD-10-CM

## 2016-12-30 DIAGNOSIS — I2510 Atherosclerotic heart disease of native coronary artery without angina pectoris: Secondary | ICD-10-CM

## 2016-12-30 DIAGNOSIS — Z7289 Other problems related to lifestyle: Secondary | ICD-10-CM | POA: Diagnosis present

## 2016-12-30 DIAGNOSIS — I714 Abdominal aortic aneurysm, without rupture: Secondary | ICD-10-CM

## 2016-12-30 DIAGNOSIS — S0101XA Laceration without foreign body of scalp, initial encounter: Secondary | ICD-10-CM | POA: Insufficient documentation

## 2016-12-30 LAB — CBC
HCT: 34 % — ABNORMAL LOW (ref 36.0–46.0)
Hemoglobin: 11.6 g/dL — ABNORMAL LOW (ref 12.0–15.0)
MCH: 31.6 pg (ref 26.0–34.0)
MCHC: 34.1 g/dL (ref 30.0–36.0)
MCV: 92.6 fL (ref 78.0–100.0)
Platelets: 277 10*3/uL (ref 150–400)
RBC: 3.67 MIL/uL — AB (ref 3.87–5.11)
RDW: 14.6 % (ref 11.5–15.5)
WBC: 8.6 10*3/uL (ref 4.0–10.5)

## 2016-12-30 LAB — TYPE AND SCREEN
ABO/RH(D): A POS
Antibody Screen: NEGATIVE

## 2016-12-30 LAB — LACTIC ACID, PLASMA: LACTIC ACID, VENOUS: 1 mmol/L (ref 0.5–1.9)

## 2016-12-30 LAB — PROCALCITONIN: Procalcitonin: <0.1 ng/mL

## 2016-12-30 LAB — APTT: APTT: 29 s (ref 24–36)

## 2016-12-30 LAB — BASIC METABOLIC PANEL
Anion gap: 8 (ref 5–15)
BUN: 16 mg/dL (ref 6–20)
CO2: 21 mmol/L — AB (ref 22–32)
CREATININE: 0.99 mg/dL (ref 0.44–1.00)
Calcium: 9 mg/dL (ref 8.9–10.3)
Chloride: 104 mmol/L (ref 101–111)
GFR calc non Af Amer: 52 mL/min — ABNORMAL LOW (ref >60)
Glucose, Bld: 106 mg/dL — ABNORMAL HIGH (ref 65–99)
Potassium: 4.3 mmol/L (ref 3.5–5.1)
SODIUM: 133 mmol/L — AB (ref 135–145)

## 2016-12-30 LAB — CBG MONITORING, ED: GLUCOSE-CAPILLARY: 95 mg/dL (ref 65–99)

## 2016-12-30 MED ORDER — BACITRACIN ZINC 500 UNIT/GM EX OINT
TOPICAL_OINTMENT | CUTANEOUS | Status: AC
  Administered 2016-12-30: 1
  Filled 2016-12-30: qty 0.9

## 2016-12-30 MED ORDER — OLANZAPINE 5 MG PO TABS
5.0000 mg | ORAL_TABLET | Freq: Every evening | ORAL | Status: DC
  Administered 2016-12-31: 5 mg via ORAL
  Filled 2016-12-30 (×3): qty 1

## 2016-12-30 MED ORDER — SERTRALINE HCL 50 MG PO TABS
25.0000 mg | ORAL_TABLET | Freq: Every morning | ORAL | Status: DC
  Administered 2016-12-30 – 2017-01-01 (×3): 25 mg via ORAL
  Filled 2016-12-30 (×3): qty 1

## 2016-12-30 MED ORDER — SODIUM CHLORIDE 0.9 % IV BOLUS (SEPSIS)
1000.0000 mL | Freq: Once | INTRAVENOUS | Status: AC
  Administered 2016-12-30: 1000 mL via INTRAVENOUS

## 2016-12-30 MED ORDER — MORPHINE SULFATE (PF) 2 MG/ML IV SOLN
1.0000 mg | INTRAVENOUS | Status: DC | PRN
  Administered 2016-12-31 – 2017-01-01 (×2): 1 mg via INTRAVENOUS
  Filled 2016-12-30 (×2): qty 1

## 2016-12-30 MED ORDER — OXYCODONE-ACETAMINOPHEN 5-325 MG PO TABS
1.0000 | ORAL_TABLET | ORAL | Status: DC | PRN
  Administered 2016-12-30 – 2017-01-01 (×4): 1 via ORAL
  Filled 2016-12-30 (×4): qty 1

## 2016-12-30 MED ORDER — LORAZEPAM 2 MG/ML IJ SOLN: 1.0000 mg | Freq: Four times a day (QID) | INTRAMUSCULAR | Status: DC | PRN

## 2016-12-30 MED ORDER — POLYETHYLENE GLYCOL 3350 17 G PO PACK: 17.0000 g | PACK | Freq: Every day | ORAL | Status: DC | PRN

## 2016-12-30 MED ORDER — SODIUM CHLORIDE 0.9 % IV SOLN
INTRAVENOUS | Status: DC
  Administered 2016-12-30 – 2016-12-31 (×3): via INTRAVENOUS

## 2016-12-30 MED ORDER — ACETAMINOPHEN 650 MG RE SUPP: 650.0000 mg | Freq: Four times a day (QID) | RECTAL | Status: DC | PRN

## 2016-12-30 MED ORDER — FOLIC ACID 1 MG PO TABS
1.0000 mg | ORAL_TABLET | Freq: Every day | ORAL | Status: DC
  Administered 2016-12-30 – 2016-12-31 (×2): 1 mg via ORAL
  Filled 2016-12-30 (×2): qty 1

## 2016-12-30 MED ORDER — ADULT MULTIVITAMIN W/MINERALS CH
1.0000 | ORAL_TABLET | Freq: Every day | ORAL | Status: DC
  Administered 2016-12-30 – 2017-01-01 (×3): 1 via ORAL
  Filled 2016-12-30 (×3): qty 1

## 2016-12-30 MED ORDER — LORATADINE 10 MG PO TABS
10.0000 mg | ORAL_TABLET | Freq: Every day | ORAL | Status: DC
  Administered 2016-12-30 – 2017-01-01 (×3): 10 mg via ORAL
  Filled 2016-12-30 (×3): qty 1

## 2016-12-30 MED ORDER — METOPROLOL TARTRATE 25 MG PO TABS
ORAL_TABLET | ORAL | Status: AC
  Filled 2016-12-30: qty 1

## 2016-12-30 MED ORDER — ASPIRIN EC 81 MG PO TBEC
81.0000 mg | DELAYED_RELEASE_TABLET | Freq: Every day | ORAL | Status: DC
  Administered 2016-12-30 – 2017-01-01 (×3): 81 mg via ORAL
  Filled 2016-12-30 (×3): qty 1

## 2016-12-30 MED ORDER — LORAZEPAM 2 MG/ML IJ SOLN: 0.0000 mg | Freq: Four times a day (QID) | INTRAMUSCULAR | Status: DC

## 2016-12-30 MED ORDER — ACETAMINOPHEN 325 MG PO TABS: 650.0000 mg | ORAL_TABLET | Freq: Four times a day (QID) | ORAL | Status: DC | PRN

## 2016-12-30 MED ORDER — ADULT MULTIVITAMIN W/MINERALS CH: 1.0000 | ORAL_TABLET | Freq: Every day | ORAL | Status: DC

## 2016-12-30 MED ORDER — ATORVASTATIN CALCIUM 40 MG PO TABS
40.0000 mg | ORAL_TABLET | Freq: Every day | ORAL | Status: DC
  Administered 2016-12-30 – 2017-01-01 (×3): 40 mg via ORAL
  Filled 2016-12-30 (×3): qty 1

## 2016-12-30 MED ORDER — CEFTRIAXONE SODIUM 1 G IJ SOLR
1.0000 g | Freq: Every day | INTRAMUSCULAR | Status: DC
  Administered 2016-12-30 (×2): 1 g via INTRAVENOUS
  Filled 2016-12-30 (×2): qty 10

## 2016-12-30 MED ORDER — FLUTICASONE PROPIONATE 50 MCG/ACT NA SUSP
1.0000 | Freq: Every day | NASAL | Status: DC | PRN
  Filled 2016-12-30: qty 16

## 2016-12-30 MED ORDER — ALBUTEROL SULFATE (2.5 MG/3ML) 0.083% IN NEBU: 2.5000 mg | INHALATION_SOLUTION | RESPIRATORY_TRACT | Status: DC | PRN

## 2016-12-30 MED ORDER — METOPROLOL TARTRATE 25 MG PO TABS
12.5000 mg | ORAL_TABLET | Freq: Two times a day (BID) | ORAL | Status: DC
  Administered 2016-12-30 (×3): 12.5 mg via ORAL
  Filled 2016-12-30 (×2): qty 1

## 2016-12-30 MED ORDER — METHOCARBAMOL 500 MG PO TABS: 500.0000 mg | ORAL_TABLET | Freq: Three times a day (TID) | ORAL | Status: DC | PRN

## 2016-12-30 MED ORDER — LORAZEPAM 1 MG PO TABS: 1.0000 mg | ORAL_TABLET | Freq: Four times a day (QID) | ORAL | Status: DC | PRN

## 2016-12-30 MED ORDER — LORAZEPAM 2 MG/ML IJ SOLN: 0.0000 mg | Freq: Two times a day (BID) | INTRAMUSCULAR | Status: DC

## 2016-12-30 MED ORDER — VITAMIN B-1 100 MG PO TABS
100.0000 mg | ORAL_TABLET | Freq: Every day | ORAL | Status: DC
  Administered 2016-12-30 – 2016-12-31 (×2): 100 mg via ORAL
  Filled 2016-12-30 (×2): qty 1

## 2016-12-30 MED ORDER — ZOLPIDEM TARTRATE 5 MG PO TABS: 5.0000 mg | ORAL_TABLET | Freq: Every evening | ORAL | Status: DC | PRN

## 2016-12-30 MED ORDER — ONDANSETRON HCL 4 MG/2ML IJ SOLN
4.0000 mg | Freq: Three times a day (TID) | INTRAMUSCULAR | Status: DC | PRN
  Administered 2016-12-31: 4 mg via INTRAVENOUS
  Filled 2016-12-30 (×2): qty 2

## 2016-12-30 MED ORDER — THIAMINE HCL 100 MG/ML IJ SOLN: 100.0000 mg | Freq: Every day | INTRAMUSCULAR | Status: DC

## 2016-12-30 NOTE — Progress Notes (Addendum)
PROGRESS NOTE  Tonya Payne  URK:[id number] DOB: 1935/03/19 DOA: 12/29/2016 PCP: Velna Ochs, MD  Outpatient Specialists:  VVS, Dr. [facility], Dr. Alvan Dame  Brief Narrative: Tonya Payne is an 82 y.o. female with a history of CAD s/p CABG, stage III CKD, HLD, TIA, COPD, and AAA who presented to the ED after tripping on the wheels of her walker in the kitchen. She fell onto her back with immediate diffuse pain worst, sharp, constant in the mid back, and back of the head where she had struck a wall on the way down. She had a scalp laceration and hematoma in the occipital area. She also endorsed dysuria and urinary frequency. On exam she had tenderness diffusely in the abdomen and the thoracic back, no focal deficits. Pyuria was noted on UA. CT head demonstrated no hemorrhage. CT chest/abdomen/pelvis was performed showing an acute T9 compression fracture and enlargement of her AAA to 6cm from 5.6cm a few weeks prior. Vascular surgery was consulted, recommending transfer to [facility] for evaluation. Ceftriaxone was given for presumptive UTI with culture sent.   Assessment & Plan: Principal Problem:   AAA (abdominal aortic aneurysm) ([place]) Active Problems:   UTI (urinary tract infection)   Hyperlipidemia LDL goal <70   S/P CABG x 3   Fall   CKD (chronic kidney disease), stage III   COPD (chronic obstructive pulmonary disease) (HCC)   Closed compression fracture of thoracic vertebra (HCC)   Abdominal pain   Alcohol use   CAD (coronary artery disease)  AAA: Acutely enlarged to 6cm from last check at 5.6cm on 11/16/2016. No imaging evidence of rupture, VSS.  - Vascular surgery to evaluate.  Fall: Mechanical fall, states she's had more imbalance lately, lives alone, had HH-PT for a while, no longer.  - PT/OT consulted, would await VVS and orthopedics recommendations prior to evaluation.  - Could consider neurocognitive evaluation as an outpatient. She seems to have cognitive impairment.  Currently living alone, but dependent on her nearby Son for many tasks.   Acute T9 compression fracture: CT demonstrated acute, traumatic fx with ~40% height loss.  - I've called [facility] for recommendations, as she's been seen by Dr. Alvan Dame for bilateral knee replacements.  - Pain control with prn percocet, morphine, robaxin.   Head trauma with occipital laceration: CT head without hemorrhage or fracture. s/p 3 staples to left occiput 8/26. Hemostatic.  - Remove staples in 10-14 days  Abdominal pain: Pt has persistent abdominal pain, not sure if the increased size of AAA is the reason of her abdominal pain. She could have some abdominal wall injury from fall. Lipase negative. CT scan did not show acute injury intra-abdominally or in the pelvis. - Pain control as above.   Sepsis due to UTI: Initially patient met criteria for sepsis with tachycardia and tachypnea. Lactic acid is normal. Hemodynamically stable. TNTC WBC's on UA micro.  - Will continue ceftriaxone pending urine culture.  - Follow up blood cultures - Sepsis physiology has resolved.  - Once plan established by VVS, would DC IVF's and start diet.   CAD: s/p remote CABG. No chest pain.  - Continue ASA, lipitor, and metoprolol.   Hyperlipidemia: Chronic, stable.  - Continue statin  Stage III CKD: Possibly with mild AKI, will have to determine baseline. Cr 1.14 on admission.  - IVF's, monitor BMP  COPD: Stable.  - Prn breathing treatments.   Alcohol use: Reports 1 glass nightly without binging.  - CIWA protocol ordered, low threshold to DC this  in next 24 hours in no signs of withdrawal.   DVT prophylaxis: SCDs Code Status: Full code, confirmed at admission. Family Communication: None at bedside; called Son per pt request. No answer.  Disposition Plan: Uncertain  Consultants:   Vascular surgery, Dr. Florene [facility]  Procedures:   None  Antimicrobials:  Ceftriaxone    Subjective: Pt's pain is controlled, still in mid back and abdomen. Not so bad when not moving. No N/V/D, or dyspnea.   Objective: BP 138/75 (BP Location: Left Arm)   Pulse 95   Temp 98 F (36.7 C) (Oral)   Resp 20   Ht 5' 1"  (1.549 m)   Wt 48.9 kg (107 lb 14.4 oz)   SpO2 97%   BMI 20.39 kg/m   Gen: Pleasant elderly female in no distress Pulm: Non-labored breathing room air. Clear to auscultation bilaterally.  CV: Regular rate and rhythm. No murmur, rub, or gallop. No JVD, no pedal edema. GI: Abdomen soft, with diffuse tenderness and pulsating midline mass superior to umbilicus. +BS. MSK: No gross deformities or definite spinal step offs. Tenderness along midline spine worst in thoracic/lumbar regions.  Skin: Longitudinal laceration just to the left of the occiput without drainage or bleeding, 3 staples in tact. No other rashes or wounds. Midline abdomina surgical scar and sternotomy scar noted.  Neuro: Alert and oriented to person, place, time, and situation, has difficulty recalling what she was told about the results of imaging or why she was sent to [facility]. No focal neurological deficits. Psych: Judgement and insight appear fair. Mild cognitive impairment noted. Mood & affect appropriate.   Data Reviewed: I have personally reviewed following labs and imaging studies  CBC:  Recent Labs Lab 12/29/16 1929 12/30/16 0613  WBC 11.7* 8.6  NEUTROABS 9.2*  --   HGB 12.4 11.6*  HCT 35.6* 34.0*  MCV 91.3 92.6  PLT 301 174   Basic Metabolic Panel:  Recent Labs Lab 12/29/16 1929 12/30/16 0613  NA 132* 133*  K 4.2 4.3  CL 101 104  CO2 22 21*  GLUCOSE 107* 106*  BUN 21* 16  CREATININE 1.14* 0.99  CALCIUM 9.2 9.0   GFR: Estimated Creatinine Clearance: 34.2 mL/min (by C-G formula based on SCr of 0.99 mg/dL). Liver Function Tests:  Recent Labs Lab 12/29/16 1929  AST 40  ALT 28  ALKPHOS 75  BILITOT 0.6  PROT 7.2  ALBUMIN 3.7    Recent Labs Lab 12/29/16 1929   LIPASE 28   No results for input(s): AMMONIA in the last 168 hours. Coagulation Profile:  Recent Labs Lab 12/29/16 1929  INR 1.02   Cardiac Enzymes: No results for input(s): CKTOTAL, CKMB, CKMBINDEX, TROPONINI in the last 168 hours. BNP (last 3 results) No results for input(s): PROBNP in the last 8760 hours. HbA1C: No results for input(s): HGBA1C in the last 72 hours. CBG:  Recent Labs Lab 12/30/16 0817  GLUCAP 95   Lipid Profile: No results for input(s): CHOL, HDL, LDLCALC, TRIG, CHOLHDL, LDLDIRECT in the last 72 hours. Thyroid Function Tests: No results for input(s): TSH, T4TOTAL, FREET4, T3FREE, THYROIDAB in the last 72 hours. Anemia Panel: No results for input(s): VITAMINB12, FOLATE, FERRITIN, TIBC, IRON, RETICCTPCT in the last 72 hours. Urine analysis:    Component Value Date/Time   COLORURINE YELLOW 12/29/2016 1912   APPEARANCEUR CLEAR 12/29/2016 1912   LABSPEC 1.015 12/29/2016 1912   PHURINE 6.5 12/29/2016 [place] NEGATIVE 12/29/2016 [place] NEGATIVE 12/29/2016 1912  BILIRUBINUR NEGATIVE 12/29/2016 [place] NEGATIVE 12/29/2016 1912   PROTEINUR TRACE (A) 12/29/2016 1912   UROBILINOGEN 0.2 02/25/2015 1826   NITRITE NEGATIVE 12/29/2016 1912   LEUKOCYTESUR MODERATE (A) 12/29/2016 1912   No results found for this or any previous visit (from the past 240 hour(s)).    Radiology Studies: Ct Head Wo Contrast  Result Date: 12/29/2016 CLINICAL DATA:  Slipped and fell, headache, trauma EXAM: CT HEAD WITHOUT CONTRAST CT CERVICAL SPINE WITHOUT CONTRAST TECHNIQUE: Multidetector CT imaging of the head and cervical spine was performed following the standard protocol without intravenous contrast. Multiplanar CT image reconstructions of the cervical spine were also generated. COMPARISON:  11/12/2016 FINDINGS: CT HEAD FINDINGS Brain: No acute territorial infarction, hemorrhage, or intracranial mass is seen. Atrophy and small vessel ischemic changes of the  white matter. 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 Old left greater than right basal ganglial infarcts. Stable ventricle size. Vascular: No hyperdense vessels.  Carotid artery calcifications. Skull: No fracture or suspicious bone lesion. Sinuses/Orbits: No acute finding. Other: Moderate left posterior scalp laceration and hematoma. CT CERVICAL SPINE FINDINGS Alignment: Trace 2 mm anterolisthesis of C3 on C4 and C7 on T1. Straightening of the cervical spine. Facet alignment is within normal limits. Skull base and vertebrae: Craniovertebral junction is intact. No displaced fracture visible. Soft tissues and spinal canal: No prevertebral fluid or swelling. No visible canal hematoma. Disc levels: Anterior plate and screw fixation from C5 through C7 with similar appearance of the hardware. Mild degenerative changes at C4-C5 and C7-T1. Upper chest: Apical emphysema and fibrosis/ scarring in the right upper lobe. Carotid artery calcification. Other: None IMPRESSION: 1. No definite CT evidence for acute intracranial abnormality. Small vessel ischemic changes of the white matter and old appearing infarcts. 2. Trace anterolisthesis of C3 on C4 and C7 on T1. No fracture is seen. Status post anterior plate and screw fixation from C5 through C7. 3. Apical emphysema and fibrosis in the right upper lobe Electronically Signed   By: Donavan Foil M.D.   On: 12/29/2016 21:37   Ct Chest W Contrast  Result Date: 12/29/2016 CLINICAL DATA:  82 year old post fall in kitchen with abdominal tenderness and thoraco lumbar spine tenderness. EXAM: CT CHEST, ABDOMEN, AND PELVIS WITH CONTRAST TECHNIQUE: Multidetector CT imaging of the chest, abdomen and pelvis was performed following the standard protocol during bolus administration of intravenous contrast. CONTRAST:  80 cc Isovue-300 IV COMPARISON:  CT angiography of the chest, abdomen, and pelvis 11/15/2016 FINDINGS: CT CHEST FINDINGS Cardiovascular: No acute aortic injury. Moderate  aortic atherosclerosis. Post CABG with calcifications of native coronary artery is. The heart is normal in size. Mediastinum/Nodes: No mediastinal hemorrhage or hematoma. No pneumomediastinum. The esophagus is decompressed. No adenopathy. Lungs/Pleura: No pneumothorax. No consolidation to suggest pulmonary contusion. Again seen emphysema and basilar honeycombing consistent with fibrosis. Multifocal areas of scarring, including subpleural nodularity in the right upper lobe is stable. Biapical pleuroparenchymal scarring. No new consolidation. No pleural fluid. Trachea and mainstem bronchi are patent. Musculoskeletal: Mild superior endplate T9 compression fracture is acute. There is minimal involvement of the posterior cortex without significant mass effect on the spinal canal. No posterior element extension. No additional fracture of the thoracic spine. Remote posterior right seventh rib fracture. No acute rib fracture. Post median sternotomy. No acute sternal fracture. Included shoulder girdles and clavicles are intact. CT ABDOMEN PELVIS FINDINGS Hepatobiliary: No hepatic injury or perihepatic hematoma. Gallbladder is unremarkable Pancreas: No pancreatic injury. Prominent pancreatic duct in the body  and tail measuring 4 mm is grossly unchanged from prior exam. Homogeneous pancreatic enhancement. Spleen: No splenic injury or perisplenic hematoma. Adrenals/Urinary Tract: No adrenal hemorrhage or renal injury identified. Left greater than right renal atrophy and parenchymal scarring. Bladder is unremarkable. Stomach/Bowel: No evidence of bowel injury or mesenteric hematoma. Colonic diverticulosis without acute inflammation. Appendix not visualized. Vascular/Lymphatic: Infrarenal abdominal aortic aneurysm, with some increase from prior exam currently 6.0 x 5.9 cm, previously 5.6 x 5.2 cm. No periaortic stranding to suggest rupture. Extensive aortic atherosclerosis of mural thrombus are unchanged. No acute vascular  injury. No retroperitoneal fluid or stranding. No adenopathy. Reproductive: Status post hysterectomy. No adnexal masses. Other: No free air, free fluid, or intra-abdominal fluid collection. Musculoskeletal: No fracture of the bony pelvis or lumbar spine. Left hip pinning is again seen. No confluent body wall contusion. IMPRESSION: 1. Acute T9 compression fracture with approximately 40% loss of height anteriorly. There is mild involvement of the posterior cortex with minimal retropulsion but no significant mass effect on the spinal canal. 2. No additional acute traumatic injury to the chest, abdomen, or pelvis. 3. Infrarenal abdominal aortic aneurysm has increased since exam 6 weeks prior, current maximal dimension 6.0 cm, previously 5.6 cm. No periaortic stranding to suggest rupture or acute injury. Recommend vascular surgery consultation. 4. Chronic lung findings of emphysema, multifocal scarring and basilar fibrosis. Electronically Signed   By: Jeb Levering M.D.   On: 12/29/2016 21:35   Ct Cervical Spine Wo Contrast  Result Date: 12/29/2016 CLINICAL DATA:  Slipped and fell, headache, trauma EXAM: CT HEAD WITHOUT CONTRAST CT CERVICAL SPINE WITHOUT CONTRAST TECHNIQUE: Multidetector CT imaging of the head and cervical spine was performed following the standard protocol without intravenous contrast. Multiplanar CT image reconstructions of the cervical spine were also generated. COMPARISON:  11/12/2016 FINDINGS: CT HEAD FINDINGS Brain: No acute territorial infarction, hemorrhage, or intracranial mass is seen. Atrophy and small vessel ischemic changes of the white matter. 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 Old left greater than right basal ganglial infarcts. Stable ventricle size. Vascular: No hyperdense vessels.  Carotid artery calcifications. Skull: No fracture or suspicious bone lesion. Sinuses/Orbits: No acute finding. Other: Moderate left posterior scalp laceration and hematoma. CT CERVICAL SPINE  FINDINGS Alignment: Trace 2 mm anterolisthesis of C3 on C4 and C7 on T1. Straightening of the cervical spine. Facet alignment is within normal limits. Skull base and vertebrae: Craniovertebral junction is intact. No displaced fracture visible. Soft tissues and spinal canal: No prevertebral fluid or swelling. No visible canal hematoma. Disc levels: Anterior plate and screw fixation from C5 through C7 with similar appearance of the hardware. Mild degenerative changes at C4-C5 and C7-T1. Upper chest: Apical emphysema and fibrosis/ scarring in the right upper lobe. Carotid artery calcification. Other: None IMPRESSION: 1. No definite CT evidence for acute intracranial abnormality. Small vessel ischemic changes of the white matter and old appearing infarcts. 2. Trace anterolisthesis of C3 on C4 and C7 on T1. No fracture is seen. Status post anterior plate and screw fixation from C5 through C7. 3. Apical emphysema and fibrosis in the right upper lobe Electronically Signed   By: Donavan Foil M.D.   On: 12/29/2016 21:37   Ct Abdomen Pelvis W Contrast  Result Date: 12/29/2016 CLINICAL DATA:  82 year old post fall in kitchen with abdominal tenderness and thoraco lumbar spine tenderness. EXAM: CT CHEST, ABDOMEN, AND PELVIS WITH CONTRAST TECHNIQUE: Multidetector CT imaging of the chest, abdomen and pelvis was performed following the standard protocol during bolus  administration of intravenous contrast. CONTRAST:  80 cc Isovue-300 IV COMPARISON:  CT angiography of the chest, abdomen, and pelvis 11/15/2016 FINDINGS: CT CHEST FINDINGS Cardiovascular: No acute aortic injury. Moderate aortic atherosclerosis. Post CABG with calcifications of native coronary artery is. The heart is normal in size. Mediastinum/Nodes: No mediastinal hemorrhage or hematoma. No pneumomediastinum. The esophagus is decompressed. No adenopathy. Lungs/Pleura: No pneumothorax. No consolidation to suggest pulmonary contusion. Again seen emphysema and  basilar honeycombing consistent with fibrosis. Multifocal areas of scarring, including subpleural nodularity in the right upper lobe is stable. Biapical pleuroparenchymal scarring. No new consolidation. No pleural fluid. Trachea and mainstem bronchi are patent. Musculoskeletal: Mild superior endplate T9 compression fracture is acute. There is minimal involvement of the posterior cortex without significant mass effect on the spinal canal. No posterior element extension. No additional fracture of the thoracic spine. Remote posterior right seventh rib fracture. No acute rib fracture. Post median sternotomy. No acute sternal fracture. Included shoulder girdles and clavicles are intact. CT ABDOMEN PELVIS FINDINGS Hepatobiliary: No hepatic injury or perihepatic hematoma. Gallbladder is unremarkable Pancreas: No pancreatic injury. Prominent pancreatic duct in the body and tail measuring 4 mm is grossly unchanged from prior exam. Homogeneous pancreatic enhancement. Spleen: No splenic injury or perisplenic hematoma. Adrenals/Urinary Tract: No adrenal hemorrhage or renal injury identified. Left greater than right renal atrophy and parenchymal scarring. Bladder is unremarkable. Stomach/Bowel: No evidence of bowel injury or mesenteric hematoma. Colonic diverticulosis without acute inflammation. Appendix not visualized. Vascular/Lymphatic: Infrarenal abdominal aortic aneurysm, with some increase from prior exam currently 6.0 x 5.9 cm, previously 5.6 x 5.2 cm. No periaortic stranding to suggest rupture. Extensive aortic atherosclerosis of mural thrombus are unchanged. No acute vascular injury. No retroperitoneal fluid or stranding. No adenopathy. Reproductive: Status post hysterectomy. No adnexal masses. Other: No free air, free fluid, or intra-abdominal fluid collection. Musculoskeletal: No fracture of the bony pelvis or lumbar spine. Left hip pinning is again seen. No confluent body wall contusion. IMPRESSION: 1. Acute T9  compression fracture with approximately 40% loss of height anteriorly. There is mild involvement of the posterior cortex with minimal retropulsion but no significant mass effect on the spinal canal. 2. No additional acute traumatic injury to the chest, abdomen, or pelvis. 3. Infrarenal abdominal aortic aneurysm has increased since exam 6 weeks prior, current maximal dimension 6.0 cm, previously 5.6 cm. No periaortic stranding to suggest rupture or acute injury. Recommend vascular surgery consultation. 4. Chronic lung findings of emphysema, multifocal scarring and basilar fibrosis. Electronically Signed   By: Jeb Levering M.D.   On: 12/29/2016 21:35    Scheduled Meds: . aspirin EC  81 mg Oral Daily  . atorvastatin  40 mg Oral Daily  . folic acid  1 mg Oral Daily  . loratadine  10 mg Oral Daily  . LORazepam  0-4 mg Intravenous Q6H   Followed by  . [START ON 01/01/2017] LORazepam  0-4 mg Intravenous Q12H  . metoprolol tartrate  12.5 mg Oral BID  . multivitamin with minerals  1 tablet Oral Daily  . OLANZapine  5 mg Oral QPM  . sertraline  25 mg Oral q morning - 10a  . thiamine  100 mg Oral Daily   Or  . thiamine  100 mg Intravenous Daily   Continuous Infusions: . sodium chloride 100 mL/hr at 12/30/16 0143  . cefTRIAXone (ROCEPHIN)  IV Stopped (12/30/16 0201)     LOS: 1 day   Time spent: 25 minutes.  Vance Gather, MD [facility] Pager  [phone]  If 7PM-7AM, please contact night-coverage [email or web address] Password [email or web address] 12/30/2016, 2:02 PM

## 2016-12-30 NOTE — ED Notes (Addendum)
Patient complaining of generalized pain-dressing to scalp intact with no further bleeding noted-patient is alert-urine wick in place-medicated with percocet for complaints of pain. VSS-patient states her pain is 6/10

## 2016-12-30 NOTE — Progress Notes (Signed)
Paged attending MD to make aware of pt arrival to unit.

## 2016-12-30 NOTE — Progress Notes (Signed)
RN received report from [facility] at 0810. Awaiting patient arrival.

## 2016-12-30 NOTE — Progress Notes (Signed)
PT Cancellation Note  Patient Details Name: Tonya Payne MRN: [id number] DOB: [birth year]   Cancelled Treatment:    Reason Eval/Treat Not Completed: Medical issues which prohibited therapy (pt admitted this am in transfer from [facility] with fall T9 comp fx and AAA not yet seen by vascular. RN requests hold until assessed by vascular for stability. Will attempt next date as appropriate)   Jaia Alonge B Vaness Jelinski 12/30/2016, 10:38 AM  Delaney Meigs, PT [phone]

## 2016-12-30 NOTE — Progress Notes (Deleted)
[facility]  Patient name: Tonya Payne MRN: [id number] DOB: 10/04/1934 Sex: female  REASON FOR CONSULT: AAA, consult is from Dr. Jarvis Newcomer.  HPI:    Tonya Payne is a 82 y.o. female who presented to [facility] yesterday after sustaining a fall. She also reported having abdominal pain and  back pain. Does not sound like her abdominal pain radiates to her back. Work-up revealed T9 compression fracture and increase in maximal diameter of known infrarenal AAA. . Lipase is negative.   She has been followed by Dr. Darrick Penna for AAA and also recently seen by Dr. Randie Heinz on 11/16/16 for this. She has been previously worked up for elective endovascular repair. Has an appointment with Dr. Darrick Penna on 01/09/17 to discuss potential surgery.   PAST MEDICAL HISTORY:   Past Medical History:  Diagnosis Date  . AAA (abdominal aortic aneurysm) without rupture (HCC) 2016   CTA 10/23/2015 - saccular infrarenal aneurysm roughly 4.8 cm in diameter. Stable from 2016 -- Dr. Darrick Penna  . Anxiety   . Atrial fibrillation, new onset (HCC) 10/2015   after CABG, in SR at d/c  . CAD (coronary artery disease), native coronary artery 10/08/2015   Severe coronary disease with calcified left main and LAD: 5% left main going into ostial LAD. Mid LAD 95% (treated with PTCA). Proximal RCA 50%, calcified. EF was 20-25% with apical akinesis and distal anterior hypokinesis. --> Referred for CABG  . Congestive dilated cardiomyopathy (HCC) 10/08/2015   Intra-Op TEE showed EF of 40 and 45%. She had an echo with a "normal LV function "documented, but there was no reading M.D.  . COPD (chronic obstructive pulmonary disease) (HCC)   . Depression   . History of acute anterior wall MI 10/08/2015   Presented with severe chest pain and dynamic anterior ST elevations/biphasic elevations area did not meet full criteria for STEMI, and troponin was not dramatically elevated. 95% LAD treated with PTCA followed by  CABG  . Hyperlipidemia LDL goal <70 10/08/2015  . IBS (irritable bowel syndrome)   . Oral thrush   . Post PTCA 10/08/2015   Presented with STEMI, severe mid LAD lesion treated with PTCA and then referred for CABG.  . TIA (transient ischemic attack)     Family History  Problem Relation Age of Onset  . Stroke Mother        dead  . Clotting disorder Father        dead  bone marrow disease    Social History  Substance Use Topics  . Smoking status: Former Smoker    Packs/day: 0.50    Types: Cigarettes    Quit date: 04/11/2015  . Smokeless tobacco: Never Used  . Alcohol use 1.2 oz/week    2 Glasses of wine per week    No Known Allergies  MEDICATIONS:   Current Facility-Administered Medications  Medication Dose Route Frequency Provider Last Rate Last Dose  . 0.9 %  sodium chloride infusion   Intravenous Continuous Lorretta Harp, MD 100 mL/hr at 12/30/16 0143    . acetaminophen (TYLENOL) tablet 650 mg  650 mg Oral Q6H PRN Lorretta Harp, MD       Or  . acetaminophen (TYLENOL) suppository 650 mg  650 mg Rectal Q6H PRN Lorretta Harp, MD      . albuterol (PROVENTIL) (2.5 MG/3ML) 0.083% nebulizer solution 2.5 mg  2.5 mg Nebulization Q4H PRN Lorretta Harp, MD      . aspirin EC tablet 81 mg  81  mg Oral Daily Lorretta Harp, MD   81 mg at 12/30/16 1106  . atorvastatin (LIPITOR) tablet 40 mg  40 mg Oral Daily Lorretta Harp, MD   40 mg at 12/30/16 1105  . cefTRIAXone (ROCEPHIN) 1 g in dextrose 5 % 50 mL IVPB  1 g Intravenous QHS Lorretta Harp, MD   Stopped at 12/30/16 0201  . fluticasone (FLONASE) 50 MCG/ACT nasal spray 1 spray  1 spray Each Nare Daily PRN Lorretta Harp, MD      . folic acid (FOLVITE) tablet 1 mg  1 mg Oral Daily Lorretta Harp, MD   1 mg at 12/30/16 1106  . loratadine (CLARITIN) tablet 10 mg  10 mg Oral Daily Lorretta Harp, MD   10 mg at 12/30/16 1106  . LORazepam (ATIVAN) injection 0-4 mg  0-4 mg Intravenous Q6H Lorretta Harp, MD   Stopped at 12/30/16 0245   Followed by  . [START ON 01/01/2017] LORazepam  (ATIVAN) injection 0-4 mg  0-4 mg Intravenous Q12H Lorretta Harp, MD      . LORazepam (ATIVAN) tablet 1 mg  1 mg Oral Q6H PRN Lorretta Harp, MD       Or  . LORazepam (ATIVAN) injection 1 mg  1 mg Intravenous Q6H PRN Lorretta Harp, MD      . methocarbamol (ROBAXIN) tablet 500 mg  500 mg Oral Q8H PRN Lorretta Harp, MD      . metoprolol tartrate (LOPRESSOR) tablet 12.5 mg  12.5 mg Oral BID Lorretta Harp, MD   12.5 mg at 12/30/16 1105  . morphine 2 MG/ML injection 1 mg  1 mg Intravenous Q3H PRN Lorretta Harp, MD      . multivitamin with minerals tablet 1 tablet  1 tablet Oral Daily Lorretta Harp, MD   1 tablet at 12/30/16 1106  . OLANZapine (ZYPREXA) tablet 5 mg  5 mg Oral QPM Lorretta Harp, MD      . ondansetron [facility]) injection 4 mg  4 mg Intravenous Q8H PRN Lorretta Harp, MD      . oxyCODONE-acetaminophen (PERCOCET/ROXICET) 5-325 MG per tablet 1 tablet  1 tablet Oral Q4H PRN Lorretta Harp, MD   1 tablet at 12/30/16 [phone]  . polyethylene glycol (MIRALAX / GLYCOLAX) packet 17 g  17 g Oral Daily PRN Lorretta Harp, MD      . sertraline (ZOLOFT) tablet 25 mg  25 mg Oral q morning - 10a Lorretta Harp, MD   25 mg at 12/30/16 1106  . thiamine (VITAMIN B-1) tablet 100 mg  100 mg Oral Daily Lorretta Harp, MD   100 mg at 12/30/16 1105   Or  . thiamine (B-1) injection 100 mg  100 mg Intravenous Daily Lorretta Harp, MD      . zolpidem (AMBIEN) tablet 5 mg  5 mg Oral QHS PRN Lorretta Harp, MD       Facility-Administered Medications Ordered in Other Encounters  Medication Dose Route Frequency Provider Last Rate Last Dose  . nitroGLYCERIN 1 mg/10 ml (100 mcg/ml) - IR/CATH LAB    PRN Marykay Lex, MD   200 mcg at 10/06/15 1525    REVIEW OF SYSTEMS:   REVIEW OF SYSTEMS (negative unless checked):   Cardiac:  []  Chest pain or chest pressure? []  Shortness of breath upon activity? []  Shortness of breath when lying flat? []  Irregular heart rhythm?  Vascular:  []  Pain in calf, thigh, or hip brought on by walking? []  Pain in feet at night that  wakes you up from your sleep? []   Blood clot in your veins? []  Leg swelling?  Pulmonary:  []  Oxygen at home? []  Productive cough? []  Wheezing?  Neurologic:  []  Sudden weakness in arms or legs? []  Sudden numbness in arms or legs? []  Sudden onset of difficult speaking or slurred speech? []  Temporary loss of vision in one eye? []  Problems with dizziness?  Gastrointestinal:  []  Blood in stool? []  Vomited blood?  Genitourinary:  []  Burning when urinating? []  Blood in urine?  Psychiatric:  []  Major depression  Hematologic:  []  Bleeding problems? []  Problems with blood clotting?  Dermatologic:  []  Rashes or ulcers?  Constitutional:  []  Fever or chills?  Ear/Nose/Throat:  []  Change in hearing? []  Nose bleeds? []  Sore throat?  Musculoskeletal:  []  Back pain? []  Joint pain? []  Muscle pain?  PHYSICAL EXAM:   Vitals:   12/30/16 0830 12/30/16 0900 12/30/16 0927 12/30/16 1100  BP: 117/73 137/75  138/75  Pulse: 87 91 95 95  Resp: 12 12 (!) 23 20  Temp:    98 F (36.7 C)  TempSrc:    Oral  SpO2: 95% 95% 96% 97%  Weight:    107 lb 14.4 oz (48.9 kg)  Height:    5\' 1"  (1.549 m)    GENERAL: The patient is a well-nourished female, in no acute distress. The vital signs are documented above. HEENT: normocephalic, atraumatic. No abnormalities noted.  CARDIAC: There is a regular rate and rhythm. No carotid bruits.  VASCULAR: Palpable radial and femoral pulses bilaterally. Non palpable pedal pulses, but feet are warm bilaterally.  PULMONARY: There is good air exchange bilaterally without wheezing or rales. ABDOMEN: Soft without distension. Mild diffuse tenderness.  MUSCULOSKELETAL: There are no major deformities or cyanosis. NEUROLOGIC: No focal weakness or paresthesias are detected. SKIN: There are no ulcers or rashes noted. PSYCHIATRIC: The patient has a normal affect.  DATA:    CT abdomen/pelvis 12/29/16  IMPRESSION: 1. Acute T9 compression fracture with  approximately 40% loss of height anteriorly. There is mild involvement of the posterior cortex with minimal retropulsion but no significant mass effect on the spinal canal. 2. No additional acute traumatic injury to the chest, abdomen, or pelvis. 3. Infrarenal abdominal aortic aneurysm has increased since exam 6 weeks prior, current maximal dimension 6.0 cm, previously 5.6 cm. No periaortic stranding to suggest rupture or acute injury. Recommend vascular surgery consultation. 4. Chronic lung findings of emphysema, multifocal scarring and basilar fibrosis.  ASSESSMENT/PLAN:   AAA without rupture  Currently abdominal and back pain unlikely related to AAA. Per Dr. Nicky Pugh review of recent CTA, there has been no change in size of AAA. Would not consider elective repair of AAA until patient recovers from her current medical issues.   Maris Berger, PA-C [facility] [place]  Addendum  I have independently interviewed and examined the patient, and I agree with the physician assistant's findings.  Pt's main complaint at this point is pain in her butt.  She currently denies any abdominal pain.  On abd exam, I don't get any tenderness to palpation.  There is an easily palpable AAA.  I reviewed the prior CTA and the recent CT abd/pelvis and I find minimal difference in measured AAA size.  There are differences between the methodology of measurement between the two radiologist.  I get roughly 58 mm maximal transverse diameter of the AAA on both studies.  - Doubt this patient has a sx AAA.  No indication for intervention at this point. - Pt already has  a follow-up appointment with Dr. Darrick Penna in September.  Will try to move up appointment.  Leonides Sake, MD, FACS [facility] [place] Office: [phone] Pager: [phone]  12/30/2016, 6:17 PM

## 2016-12-30 NOTE — Consult Note (Signed)
WOC Nurse wound consult note Reason for Consult: Fall at home with scalp laceration and 3 intact staples present to posterior occiput.  Scant serosanguinous drainage.  Has dried, crusted blood in hair.  Would like a cap shampoo.  Bedside RN asked to facilitate this.   Wound type:Trauma Pressure Injury POA: NA Measurement: 4 cm linear abrasion with 3 staples intact.  Surrounding dried blood and hair.  Will shampoo to insure there is no other nonintact areas.   Wound OHF:[id number] intact Drainage (amount, consistency, odor) Minimal serosanguinous  NO odor Periwound:dried crusted hair Dressing procedure/placement/frequency:Cleanse hair with shower cap.  Ensure that staple line is dried afterwards.  Clip hair if unable to wash dried exudate out.   Will not follow at this time.  Please re-consult if needed.  Maple Hudson RN BSN CWON Pager [phone]

## 2016-12-30 NOTE — Progress Notes (Signed)
   Daily Progress Note  Cased discussed with ED MD earlier.  Awaiting transfer to [facility] for exam.  I have reviewed the CT.  There is NO change is size.  The prior CTA measurements were not done at the point of maximal transverse dilation.  The maximal diameter on the two CT is the same.  - Transfer to [facility] as there is NO vascular equipment at [facility] in the event any aortic intervention is needed. - Will evaluate once pt transferred here   Leonides Sake, MD, FACS [facility] [place] Office: [phone] Pager: [phone]  12/30/2016, 7:06 AM

## 2016-12-30 NOTE — ED Notes (Addendum)
. ED TO INPATIENT HANDOFF REPORT  Name/Age/Gender Tonya Payne 82 y.o. female  Code Status    Code Status Orders        Start     Ordered   12/30/16 0034  Full code  Continuous     12/30/16 0035    Code Status History    Date Active Date Inactive Code Status Order ID Comments User Context   11/12/2016  3:32 PM 11/16/2016  3:03 PM DNR [id number]  Lavina Hamman, MD ED   09/24/2016 12:23 AM 09/27/2016  3:04 AM DNR [id number]  Rise Patience, MD Inpatient   09/23/2016 11:19 PM 09/24/2016 12:23 AM Full Code [id number]  Rise Patience, MD Inpatient   01/27/2016 10:24 AM 01/29/2016  8:07 PM DNR [id number]  Florencia Reasons, MD Inpatient   01/27/2016  2:26 AM 01/27/2016 10:24 AM Full Code [id number]  Etta Quill, DO Inpatient   10/06/2015  4:49 PM 10/16/2015 12:28 PM Full Code [id number]  Isaiah Serge, NP Inpatient   02/25/2015  8:32 PM 02/28/2015 12:29 PM Full Code [id number]  Delos Haring, PA-C ED   07/09/2013 11:21 AM 07/09/2013  8:06 PM Full Code [id number]  Sharyon Cable, MD ED    Advance Directive Documentation     Most Recent Value  Type of Advance Directive  Healthcare Power of Attorney  Pre-existing out of facility DNR order (yellow form or pink MOST form)  -  "MOST" Form in Place?  -      Home/SNF/Other Home  Chief Complaint Fall; Head Injury  Level of Care/Admitting Diagnosis ED Disposition    ED Disposition Condition [place]: [place] [100100]  Level of Care: Telemetry [5]  Diagnosis: AAA (abdominal aortic aneurysm) [facility]) [103159]  Admitting Physician: Ivor Costa [[id number]]  Attending Physician: Ivor Costa [phone]  Estimated length of stay: past midnight tomorrow  Certification:: I certify this patient will need inpatient services for at least 2 midnights  PT Class (Do Not Modify): Inpatient [101]  PT Acc Code (Do Not Modify): Private [1]       Medical History Past Medical History:  Diagnosis Date  . AAA (abdominal  aortic aneurysm) without rupture ([place]) 2016   CTA 10/23/2015 - saccular infrarenal aneurysm roughly 4.8 cm in diameter. Stable from 2016 -- Dr. Oneida Alar  . Anxiety   . Atrial fibrillation, new onset ([place]) 10/2015   after CABG, in SR at d/c  . CAD (coronary artery disease), native coronary artery 10/08/2015   Severe coronary disease with calcified left main and LAD: 5% left main going into ostial LAD. Mid LAD 95% (treated with PTCA). Proximal RCA 50%, calcified. EF was 20-25% with apical akinesis and distal anterior hypokinesis. --> Referred for CABG  . Congestive dilated cardiomyopathy ([place]) 10/08/2015   Intra-Op TEE showed EF of 40 and 45%. She had an echo with a "normal LV function "documented, but there was no reading M.D.  . COPD (chronic obstructive pulmonary disease) ([place])   . Depression   . History of acute anterior wall MI 10/08/2015   Presented with severe chest pain and dynamic anterior ST elevations/biphasic elevations area did not meet full criteria for STEMI, and troponin was not dramatically elevated. 95% LAD treated with PTCA followed by CABG  . Hyperlipidemia LDL goal <70 10/08/2015  . IBS (irritable bowel syndrome)   . Oral thrush   . Post PTCA 10/08/2015   Presented with STEMI, severe mid LAD lesion treated with PTCA  and then referred for CABG.  . TIA (transient ischemic attack)     Allergies No Known Allergies  IV Location/Drains/Wounds Patient Lines/Drains/Airways Status   Active Line/Drains/Airways    Name:   Placement date:   Placement time:   Site:   Days:   Peripheral IV 12/29/16 Right Antecubital  12/29/16    1931    Antecubital    1   AIRWAYS  09/24/16    1804      97   Incision (Closed) 09/24/16 Hip Left  09/24/16    1919      97   Pressure Injury 11/12/16 Unstageable - Full thickness tissue loss in which the base of the ulcer is covered by slough (yellow, tan, gray, green or brown) and/or eschar (tan, brown or black) in the wound bed. healing pressure ulcer   11/12/16    1500      48   Pressure Injury 11/12/16 Unstageable - Full thickness tissue loss in which the base of the ulcer is covered by slough (yellow, tan, gray, green or brown) and/or eschar (tan, brown or black) in the wound bed.  11/12/16    1500      48          Labs/Imaging Results for orders placed or performed during the hospital encounter of 12/29/16 (from the past 48 hour(s))  Urinalysis, Routine w reflex microscopic     Status: Abnormal   Collection Time: 12/29/16  7:12 PM  Result Value Ref Range   Color, Urine YELLOW YELLOW   APPearance CLEAR CLEAR   Specific Gravity, Urine 1.015 1.005 - 1.030   pH 6.5 5.0 - 8.0   Glucose, UA NEGATIVE NEGATIVE mg/dL   Hgb urine dipstick NEGATIVE NEGATIVE   Bilirubin Urine NEGATIVE NEGATIVE   Ketones, ur NEGATIVE NEGATIVE mg/dL   Protein, ur TRACE (A) NEGATIVE mg/dL   Nitrite NEGATIVE NEGATIVE   Leukocytes, UA MODERATE (A) NEGATIVE   RBC / HPF 0-5 0 - 5 RBC/hpf   WBC, UA TOO NUMEROUS TO COUNT 0 - 5 WBC/hpf   Bacteria, UA NONE SEEN NONE SEEN   Squamous Epithelial / LPF 0-5 (A) NONE SEEN   Hyaline Casts, UA PRESENT   Protime-INR     Status: None   Collection Time: 12/29/16  7:29 PM  Result Value Ref Range   Prothrombin Time 13.4 11.4 - 15.2 seconds   INR 1.02   CBC with Differential     Status: Abnormal   Collection Time: 12/29/16  7:29 PM  Result Value Ref Range   WBC 11.7 (H) 4.0 - 10.5 K/uL   RBC 3.90 3.87 - 5.11 MIL/uL   Hemoglobin 12.4 12.0 - 15.0 g/dL   HCT 35.6 (L) 36.0 - 46.0 %   MCV 91.3 78.0 - 100.0 fL   MCH 31.8 26.0 - 34.0 pg   MCHC 34.8 30.0 - 36.0 g/dL   RDW 14.6 11.5 - 15.5 %   Platelets 301 150 - 400 K/uL   Neutrophils Relative % 78 %   Neutro Abs 9.2 (H) 1.7 - 7.7 K/uL   Lymphocytes Relative 12 %   Lymphs Abs 1.4 0.7 - 4.0 K/uL   Monocytes Relative 9 %   Monocytes Absolute 1.0 0.1 - 1.0 K/uL   Eosinophils Relative 1 %   Eosinophils Absolute 0.1 0.0 - 0.7 K/uL   Basophils Relative 0 %   Basophils  Absolute 0.0 0.0 - 0.1 K/uL  Comprehensive metabolic panel     Status: Abnormal  Collection Time: 12/29/16  7:29 PM  Result Value Ref Range   Sodium 132 (L) 135 - 145 mmol/L   Potassium 4.2 3.5 - 5.1 mmol/L   Chloride 101 101 - 111 mmol/L   CO2 22 22 - 32 mmol/L   Glucose, Bld 107 (H) 65 - 99 mg/dL   BUN 21 (H) 6 - 20 mg/dL   Creatinine, Ser 1.14 (H) 0.44 - 1.00 mg/dL   Calcium 9.2 8.9 - 10.3 mg/dL   Total Protein 7.2 6.5 - 8.1 g/dL   Albumin 3.7 3.5 - 5.0 g/dL   AST 40 15 - 41 U/L   ALT 28 14 - 54 U/L   Alkaline Phosphatase 75 38 - 126 U/L   Total Bilirubin 0.6 0.3 - 1.2 mg/dL   GFR calc non Af Amer 44 (L) >60 mL/min   GFR calc Af Amer 51 (L) >60 mL/min    Comment: (NOTE) The eGFR has been calculated using the CKD EPI equation. This calculation has not been validated in all clinical situations. eGFR's persistently <60 mL/min signify possible Chronic Kidney Disease.    Anion gap 9 5 - 15  Lipase, blood     Status: None   Collection Time: 12/29/16  7:29 PM  Result Value Ref Range   Lipase 28 11 - 51 U/L  I-Stat CG4 Lactic Acid, ED     Status: None   Collection Time: 12/29/16  7:40 PM  Result Value Ref Range   Lactic Acid, Venous 0.91 0.5 - 1.9 mmol/L  APTT     Status: None   Collection Time: 12/30/16  1:32 AM  Result Value Ref Range   aPTT 29 24 - 36 seconds  Type and screen [place]     Status: None   Collection Time: 12/30/16  1:32 AM  Result Value Ref Range   ABO/RH(D) A POS    Antibody Screen NEG    Sample Expiration 01/02/2017   Lactic acid, plasma     Status: None   Collection Time: 12/30/16  1:32 AM  Result Value Ref Range   Lactic Acid, Venous 1.0 0.5 - 1.9 mmol/L  Procalcitonin     Status: None   Collection Time: 12/30/16  1:32 AM  Result Value Ref Range   Procalcitonin <0.10 ng/mL    Comment:        Interpretation: PCT (Procalcitonin) <= 0.5 ng/mL: Systemic infection (sepsis) is not likely. Local bacterial infection is  possible. (NOTE)         ICU PCT Algorithm               Non ICU PCT Algorithm    ----------------------------     ------------------------------         PCT < 0.25 ng/mL                 PCT < 0.1 ng/mL     Stopping of antibiotics            Stopping of antibiotics       strongly encouraged.               strongly encouraged.    ----------------------------     ------------------------------       PCT level decrease by               PCT < 0.25 ng/mL       >= 80% from peak PCT       OR PCT 0.25 - 0.5 ng/mL  Stopping of antibiotics                                             encouraged.     Stopping of antibiotics           encouraged.    ----------------------------     ------------------------------       PCT level decrease by              PCT >= 0.25 ng/mL       < 80% from peak PCT        AND PCT >= 0.5 ng/mL            Continuin g antibiotics                                              encouraged.       Continuing antibiotics            encouraged.    ----------------------------     ------------------------------     PCT level increase compared          PCT > 0.5 ng/mL         with peak PCT AND          PCT >= 0.5 ng/mL             Escalation of antibiotics                                          strongly encouraged.      Escalation of antibiotics        strongly encouraged.   Basic metabolic panel     Status: Abnormal   Collection Time: 12/30/16  6:13 AM  Result Value Ref Range   Sodium 133 (L) 135 - 145 mmol/L   Potassium 4.3 3.5 - 5.1 mmol/L   Chloride 104 101 - 111 mmol/L   CO2 21 (L) 22 - 32 mmol/L   Glucose, Bld 106 (H) 65 - 99 mg/dL   BUN 16 6 - 20 mg/dL   Creatinine, Ser 0.99 0.44 - 1.00 mg/dL   Calcium 9.0 8.9 - 10.3 mg/dL   GFR calc non Af Amer 52 (L) >60 mL/min   GFR calc Af Amer >60 >60 mL/min    Comment: (NOTE) The eGFR has been calculated using the CKD EPI equation. This calculation has not been validated in all clinical situations. eGFR's  persistently <60 mL/min signify possible Chronic Kidney Disease.    Anion gap 8 5 - 15  CBC     Status: Abnormal   Collection Time: 12/30/16  6:13 AM  Result Value Ref Range   WBC 8.6 4.0 - 10.5 K/uL   RBC 3.67 (L) 3.87 - 5.11 MIL/uL   Hemoglobin 11.6 (L) 12.0 - 15.0 g/dL   HCT 34.0 (L) 36.0 - 46.0 %   MCV 92.6 78.0 - 100.0 fL   MCH 31.6 26.0 - 34.0 pg   MCHC 34.1 30.0 - 36.0 g/dL   RDW 14.6 11.5 - 15.5 %   Platelets 277 150 - 400 K/uL   Ct Head Wo Contrast  Result Date: 12/29/2016 CLINICAL DATA:  Slipped and fell, headache, trauma EXAM: CT HEAD WITHOUT CONTRAST CT CERVICAL SPINE WITHOUT CONTRAST TECHNIQUE: Multidetector CT imaging of the head and cervical spine was performed following the standard protocol without intravenous contrast. Multiplanar CT image reconstructions of the cervical spine were also generated. COMPARISON:  11/12/2016 FINDINGS: CT HEAD FINDINGS Brain: No acute territorial infarction, hemorrhage, or intracranial mass is seen. Atrophy and small vessel ischemic changes of the white matter. 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 Old left greater than right basal ganglial infarcts. Stable ventricle size. Vascular: No hyperdense vessels.  Carotid artery calcifications. Skull: No fracture or suspicious bone lesion. Sinuses/Orbits: No acute finding. Other: Moderate left posterior scalp laceration and hematoma. CT CERVICAL SPINE FINDINGS Alignment: Trace 2 mm anterolisthesis of C3 on C4 and C7 on T1. Straightening of the cervical spine. Facet alignment is within normal limits. Skull base and vertebrae: Craniovertebral junction is intact. No displaced fracture visible. Soft tissues and spinal canal: No prevertebral fluid or swelling. No visible canal hematoma. Disc levels: Anterior plate and screw fixation from C5 through C7 with similar appearance of the hardware. Mild degenerative changes at C4-C5 and C7-T1. Upper chest: Apical emphysema and fibrosis/ scarring in the right upper  lobe. Carotid artery calcification. Other: None IMPRESSION: 1. No definite CT evidence for acute intracranial abnormality. Small vessel ischemic changes of the white matter and old appearing infarcts. 2. Trace anterolisthesis of C3 on C4 and C7 on T1. No fracture is seen. Status post anterior plate and screw fixation from C5 through C7. 3. Apical emphysema and fibrosis in the right upper lobe Electronically Signed   By: Donavan Foil M.D.   On: 12/29/2016 21:37   Ct Chest W Contrast  Result Date: 12/29/2016 CLINICAL DATA:  82 year old post fall in kitchen with abdominal tenderness and thoraco lumbar spine tenderness. EXAM: CT CHEST, ABDOMEN, AND PELVIS WITH CONTRAST TECHNIQUE: Multidetector CT imaging of the chest, abdomen and pelvis was performed following the standard protocol during bolus administration of intravenous contrast. CONTRAST:  80 cc Isovue-300 IV COMPARISON:  CT angiography of the chest, abdomen, and pelvis 11/15/2016 FINDINGS: CT CHEST FINDINGS Cardiovascular: No acute aortic injury. Moderate aortic atherosclerosis. Post CABG with calcifications of native coronary artery is. The heart is normal in size. Mediastinum/Nodes: No mediastinal hemorrhage or hematoma. No pneumomediastinum. The esophagus is decompressed. No adenopathy. Lungs/Pleura: No pneumothorax. No consolidation to suggest pulmonary contusion. Again seen emphysema and basilar honeycombing consistent with fibrosis. Multifocal areas of scarring, including subpleural nodularity in the right upper lobe is stable. Biapical pleuroparenchymal scarring. No new consolidation. No pleural fluid. Trachea and mainstem bronchi are patent. Musculoskeletal: Mild superior endplate T9 compression fracture is acute. There is minimal involvement of the posterior cortex without significant mass effect on the spinal canal. No posterior element extension. No additional fracture of the thoracic spine. Remote posterior right seventh rib fracture. No acute rib  fracture. Post median sternotomy. No acute sternal fracture. Included shoulder girdles and clavicles are intact. CT ABDOMEN PELVIS FINDINGS Hepatobiliary: No hepatic injury or perihepatic hematoma. Gallbladder is unremarkable Pancreas: No pancreatic injury. Prominent pancreatic duct in the body and tail measuring 4 mm is grossly unchanged from prior exam. Homogeneous pancreatic enhancement. Spleen: No splenic injury or perisplenic hematoma. Adrenals/Urinary Tract: No adrenal hemorrhage or renal injury identified. Left greater than right renal atrophy and parenchymal scarring. Bladder is unremarkable. Stomach/Bowel: No evidence of bowel injury or mesenteric hematoma. Colonic diverticulosis without acute inflammation. Appendix not visualized. Vascular/Lymphatic: Infrarenal abdominal aortic aneurysm, with some increase from  prior exam currently 6.0 x 5.9 cm, previously 5.6 x 5.2 cm. No periaortic stranding to suggest rupture. Extensive aortic atherosclerosis of mural thrombus are unchanged. No acute vascular injury. No retroperitoneal fluid or stranding. No adenopathy. Reproductive: Status post hysterectomy. No adnexal masses. Other: No free air, free fluid, or intra-abdominal fluid collection. Musculoskeletal: No fracture of the bony pelvis or lumbar spine. Left hip pinning is again seen. No confluent body wall contusion. IMPRESSION: 1. Acute T9 compression fracture with approximately 40% loss of height anteriorly. There is mild involvement of the posterior cortex with minimal retropulsion but no significant mass effect on the spinal canal. 2. No additional acute traumatic injury to the chest, abdomen, or pelvis. 3. Infrarenal abdominal aortic aneurysm has increased since exam 6 weeks prior, current maximal dimension 6.0 cm, previously 5.6 cm. No periaortic stranding to suggest rupture or acute injury. Recommend vascular surgery consultation. 4. Chronic lung findings of emphysema, multifocal scarring and basilar  fibrosis. Electronically Signed   By: Jeb Levering M.D.   On: 12/29/2016 21:35   Ct Cervical Spine Wo Contrast  Result Date: 12/29/2016 CLINICAL DATA:  Slipped and fell, headache, trauma EXAM: CT HEAD WITHOUT CONTRAST CT CERVICAL SPINE WITHOUT CONTRAST TECHNIQUE: Multidetector CT imaging of the head and cervical spine was performed following the standard protocol without intravenous contrast. Multiplanar CT image reconstructions of the cervical spine were also generated. COMPARISON:  11/12/2016 FINDINGS: CT HEAD FINDINGS Brain: No acute territorial infarction, hemorrhage, or intracranial mass is seen. Atrophy and small vessel ischemic changes of the white matter. 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 Old left greater than right basal ganglial infarcts. Stable ventricle size. Vascular: No hyperdense vessels.  Carotid artery calcifications. Skull: No fracture or suspicious bone lesion. Sinuses/Orbits: No acute finding. Other: Moderate left posterior scalp laceration and hematoma. CT CERVICAL SPINE FINDINGS Alignment: Trace 2 mm anterolisthesis of C3 on C4 and C7 on T1. Straightening of the cervical spine. Facet alignment is within normal limits. Skull base and vertebrae: Craniovertebral junction is intact. No displaced fracture visible. Soft tissues and spinal canal: No prevertebral fluid or swelling. No visible canal hematoma. Disc levels: Anterior plate and screw fixation from C5 through C7 with similar appearance of the hardware. Mild degenerative changes at C4-C5 and C7-T1. Upper chest: Apical emphysema and fibrosis/ scarring in the right upper lobe. Carotid artery calcification. Other: None IMPRESSION: 1. No definite CT evidence for acute intracranial abnormality. Small vessel ischemic changes of the white matter and old appearing infarcts. 2. Trace anterolisthesis of C3 on C4 and C7 on T1. No fracture is seen. Status post anterior plate and screw fixation from C5 through C7. 3. Apical emphysema and  fibrosis in the right upper lobe Electronically Signed   By: Donavan Foil M.D.   On: 12/29/2016 21:37   Ct Abdomen Pelvis W Contrast  Result Date: 12/29/2016 CLINICAL DATA:  82 year old post fall in kitchen with abdominal tenderness and thoraco lumbar spine tenderness. EXAM: CT CHEST, ABDOMEN, AND PELVIS WITH CONTRAST TECHNIQUE: Multidetector CT imaging of the chest, abdomen and pelvis was performed following the standard protocol during bolus administration of intravenous contrast. CONTRAST:  80 cc Isovue-300 IV COMPARISON:  CT angiography of the chest, abdomen, and pelvis 11/15/2016 FINDINGS: CT CHEST FINDINGS Cardiovascular: No acute aortic injury. Moderate aortic atherosclerosis. Post CABG with calcifications of native coronary artery is. The heart is normal in size. Mediastinum/Nodes: No mediastinal hemorrhage or hematoma. No pneumomediastinum. The esophagus is decompressed. No adenopathy. Lungs/Pleura: No pneumothorax. No consolidation to  suggest pulmonary contusion. Again seen emphysema and basilar honeycombing consistent with fibrosis. Multifocal areas of scarring, including subpleural nodularity in the right upper lobe is stable. Biapical pleuroparenchymal scarring. No new consolidation. No pleural fluid. Trachea and mainstem bronchi are patent. Musculoskeletal: Mild superior endplate T9 compression fracture is acute. There is minimal involvement of the posterior cortex without significant mass effect on the spinal canal. No posterior element extension. No additional fracture of the thoracic spine. Remote posterior right seventh rib fracture. No acute rib fracture. Post median sternotomy. No acute sternal fracture. Included shoulder girdles and clavicles are intact. CT ABDOMEN PELVIS FINDINGS Hepatobiliary: No hepatic injury or perihepatic hematoma. Gallbladder is unremarkable Pancreas: No pancreatic injury. Prominent pancreatic duct in the body and tail measuring 4 mm is grossly unchanged from prior  exam. Homogeneous pancreatic enhancement. Spleen: No splenic injury or perisplenic hematoma. Adrenals/Urinary Tract: No adrenal hemorrhage or renal injury identified. Left greater than right renal atrophy and parenchymal scarring. Bladder is unremarkable. Stomach/Bowel: No evidence of bowel injury or mesenteric hematoma. Colonic diverticulosis without acute inflammation. Appendix not visualized. Vascular/Lymphatic: Infrarenal abdominal aortic aneurysm, with some increase from prior exam currently 6.0 x 5.9 cm, previously 5.6 x 5.2 cm. No periaortic stranding to suggest rupture. Extensive aortic atherosclerosis of mural thrombus are unchanged. No acute vascular injury. No retroperitoneal fluid or stranding. No adenopathy. Reproductive: Status post hysterectomy. No adnexal masses. Other: No free air, free fluid, or intra-abdominal fluid collection. Musculoskeletal: No fracture of the bony pelvis or lumbar spine. Left hip pinning is again seen. No confluent body wall contusion. IMPRESSION: 1. Acute T9 compression fracture with approximately 40% loss of height anteriorly. There is mild involvement of the posterior cortex with minimal retropulsion but no significant mass effect on the spinal canal. 2. No additional acute traumatic injury to the chest, abdomen, or pelvis. 3. Infrarenal abdominal aortic aneurysm has increased since exam 6 weeks prior, current maximal dimension 6.0 cm, previously 5.6 cm. No periaortic stranding to suggest rupture or acute injury. Recommend vascular surgery consultation. 4. Chronic lung findings of emphysema, multifocal scarring and basilar fibrosis. Electronically Signed   By: Jeb Levering M.D.   On: 12/29/2016 21:35    Pending Labs Unresulted Labs    Start     Ordered   12/30/16 0033  Lactic acid, plasma  STAT Now then every 3 hours,   STAT     12/30/16 0033   12/30/16 0029  Culture, blood (Routine X 2) w Reflex to ID Panel  BLOOD CULTURE X 2,   R    Comments:  Please obtain  prior to antibiotic administration.    12/30/16 0030   12/30/16 0029  Urine Culture  Once,   R    Comments:  Please get clean catch specimen ONLY (DO NOT obtain from urinal)    12/30/16 0030      Vitals/Pain Today's Vitals   12/30/16 0630 12/30/16 0700 12/30/16 0708 12/30/16 0728  BP: (!) 173/93 118/63    Pulse: (!) 103 95    Resp: (!) 26 14    Temp:      TempSrc:      SpO2: 91% 91%    Weight:      Height:      PainSc: 7   Asleep 5     Isolation Precautions No active isolations  Medications Medications  morphine 2 MG/ML injection 1 mg (not administered)  polyethylene glycol (MIRALAX / GLYCOLAX) packet 17 g (not administered)  aspirin EC tablet 81 mg (not  administered)  fluticasone (FLONASE) 50 MCG/ACT nasal spray 1 spray (not administered)  sertraline (ZOLOFT) tablet 25 mg (not administered)  metoprolol tartrate (LOPRESSOR) tablet 12.5 mg (12.5 mg Oral Given 12/30/16 0146)  MULTIVITAMIN ADULTS TABS (not administered)  OLANZapine (ZYPREXA) tablet 5 mg (not administered)  loratadine (CLARITIN) tablet 10 mg (not administered)  atorvastatin (LIPITOR) tablet 40 mg (not administered)  cefTRIAXone (ROCEPHIN) 1 g in dextrose 5 % 50 mL IVPB (0 g Intravenous Stopped 12/30/16 0201)  ondansetron (ZOFRAN) injection 4 mg (not administered)  oxyCODONE-acetaminophen (PERCOCET/ROXICET) 5-325 MG per tablet 1 tablet (1 tablet Oral Given 12/30/16 0637)  methocarbamol (ROBAXIN) tablet 500 mg (not administered)  0.9 %  sodium chloride infusion ( Intravenous New Bag/Given 12/30/16 0143)  albuterol (PROVENTIL) (2.5 MG/3ML) 0.083% nebulizer solution 2.5 mg (not administered)  acetaminophen (TYLENOL) tablet 650 mg (not administered)    Or  acetaminophen (TYLENOL) suppository 650 mg (not administered)  zolpidem (AMBIEN) tablet 5 mg (not administered)  metoprolol tartrate (LOPRESSOR) 25 MG tablet (not administered)  LORazepam (ATIVAN) tablet 1 mg (not administered)    Or  LORazepam (ATIVAN)  injection 1 mg (not administered)  thiamine (VITAMIN B-1) tablet 100 mg (not administered)    Or  thiamine (B-1) injection 100 mg (not administered)  folic acid (FOLVITE) tablet 1 mg (not administered)  multivitamin with minerals tablet 1 tablet (not administered)  LORazepam (ATIVAN) injection 0-4 mg (0 mg Intravenous Hold 12/30/16 0245)    Followed by  LORazepam (ATIVAN) injection 0-4 mg (not administered)  morphine 4 MG/ML injection 4 mg (4 mg Intravenous Given 12/29/16 1932)  morphine 4 MG/ML injection 4 mg (4 mg Intravenous Given 12/29/16 2037)  iopamidol (ISOVUE-300) 61 % injection (80 mLs Intravenous Contrast Given 12/29/16 2041)  bacitracin 500 UNIT/GM ointment (1 application  Given 0/45/99 0023)  sodium chloride 0.9 % bolus 1,000 mL (0 mLs Intravenous Stopped 12/30/16 0304)    Mobility manual wheelchair Also has walker

## 2016-12-30 NOTE — Consult Note (Addendum)
Patient name: Tonya Payne MRN: [id number] DOB: 27-Oct-1934 Sex: female  REASON FOR CONSULT: AAA, consult is from Dr. Jarvis Newcomer.  HPI:    Tonya Payne is a 82 y.o. female who presented to [facility] yesterday after sustaining a fall. She also reported having abdominal pain and back pain. Does not sound like her abdominal pain radiates to her back. Work-up revealed T9 compression fracture and increase in maximal diameter of known infrarenal AAA. Lipase is negative.   She has been followed by Dr. Darrick Penna for AAA and also recently seen by Dr. Randie Heinz on 11/16/16 for this. She has been previously worked up for elective endovascular repair. Has an appointment with Dr. Darrick Penna on 01/09/17 to discuss potential surgery.   PAST MEDICAL HISTORY:   Past Medical History:  Diagnosis Date  . AAA (abdominal aortic aneurysm) without rupture (HCC) 2016   CTA 10/23/2015 - saccular infrarenal aneurysm roughly 4.8 cm in diameter. Stable from 2016 -- Dr. Darrick Penna  . Anxiety   . Atrial fibrillation, new onset (HCC) 10/2015   after CABG, in SR at d/c  . CAD (coronary artery disease), native coronary artery 10/08/2015   Severe coronary disease with calcified left main and LAD: 5% left main going into ostial LAD. Mid LAD 95% (treated with PTCA). Proximal RCA 50%, calcified. EF was 20-25% with apical akinesis and distal anterior hypokinesis. --> Referred for CABG  . Congestive dilated cardiomyopathy (HCC) 10/08/2015   Intra-Op TEE showed EF of 40 and 45%. She had an echo with a "normal LV function "documented, but there was no reading M.D.  . COPD (chronic obstructive pulmonary disease) (HCC)   . Depression   . History of acute anterior wall MI 10/08/2015   Presented with severe chest pain and dynamic anterior ST elevations/biphasic elevations area did not meet full criteria for STEMI, and troponin was not dramatically elevated. 95% LAD treated with PTCA followed by CABG  . Hyperlipidemia LDL goal <70 10/08/2015    . IBS (irritable bowel syndrome)   . Oral thrush   . Post PTCA 10/08/2015   Presented with STEMI, severe mid LAD lesion treated with PTCA and then referred for CABG.  . TIA (transient ischemic attack)     Family History  Problem Relation Age of Onset  . Stroke Mother        dead  . Clotting disorder Father        dead  bone marrow disease    Social History  Substance Use Topics  . Smoking status: Former Smoker    Packs/day: 0.50    Types: Cigarettes    Quit date: 04/11/2015  . Smokeless tobacco: Never Used  . Alcohol use 1.2 oz/week    2 Glasses of wine per week    No Known Allergies  MEDICATIONS:   Current Facility-Administered Medications  Medication Dose Route Frequency Provider Last Rate Last Dose  . 0.9 %  sodium chloride infusion   Intravenous Continuous Lorretta Harp, MD 100 mL/hr at 12/30/16 0143    . acetaminophen (TYLENOL) tablet 650 mg  650 mg Oral Q6H PRN Lorretta Harp, MD       Or  . acetaminophen (TYLENOL) suppository 650 mg  650 mg Rectal Q6H PRN Lorretta Harp, MD      . albuterol (PROVENTIL) (2.5 MG/3ML) 0.083% nebulizer solution 2.5 mg  2.5 mg Nebulization Q4H PRN Lorretta Harp, MD      . aspirin EC tablet 81 mg  81 mg Oral Daily Lorretta Harp, MD  81 mg at 12/30/16 1106  . atorvastatin (LIPITOR) tablet 40 mg  40 mg Oral Daily Lorretta Harp, MD   40 mg at 12/30/16 1105  . cefTRIAXone (ROCEPHIN) 1 g in dextrose 5 % 50 mL IVPB  1 g Intravenous QHS Lorretta Harp, MD   Stopped at 12/30/16 0201  . fluticasone (FLONASE) 50 MCG/ACT nasal spray 1 spray  1 spray Each Nare Daily PRN Lorretta Harp, MD      . folic acid (FOLVITE) tablet 1 mg  1 mg Oral Daily Lorretta Harp, MD   1 mg at 12/30/16 1106  . loratadine (CLARITIN) tablet 10 mg  10 mg Oral Daily Lorretta Harp, MD   10 mg at 12/30/16 1106  . LORazepam (ATIVAN) injection 0-4 mg  0-4 mg Intravenous Q6H Lorretta Harp, MD   Stopped at 12/30/16 0245   Followed by  . [START ON 01/01/2017] LORazepam (ATIVAN) injection 0-4 mg  0-4 mg Intravenous  Q12H Lorretta Harp, MD      . LORazepam (ATIVAN) tablet 1 mg  1 mg Oral Q6H PRN Lorretta Harp, MD       Or  . LORazepam (ATIVAN) injection 1 mg  1 mg Intravenous Q6H PRN Lorretta Harp, MD      . methocarbamol (ROBAXIN) tablet 500 mg  500 mg Oral Q8H PRN Lorretta Harp, MD      . metoprolol tartrate (LOPRESSOR) tablet 12.5 mg  12.5 mg Oral BID Lorretta Harp, MD   12.5 mg at 12/30/16 1105  . morphine 2 MG/ML injection 1 mg  1 mg Intravenous Q3H PRN Lorretta Harp, MD      . multivitamin with minerals tablet 1 tablet  1 tablet Oral Daily Lorretta Harp, MD   1 tablet at 12/30/16 1106  . OLANZapine (ZYPREXA) tablet 5 mg  5 mg Oral QPM Lorretta Harp, MD      . ondansetron [facility]) injection 4 mg  4 mg Intravenous Q8H PRN Lorretta Harp, MD      . oxyCODONE-acetaminophen (PERCOCET/ROXICET) 5-325 MG per tablet 1 tablet  1 tablet Oral Q4H PRN Lorretta Harp, MD   1 tablet at 12/30/16 [phone]  . polyethylene glycol (MIRALAX / GLYCOLAX) packet 17 g  17 g Oral Daily PRN Lorretta Harp, MD      . sertraline (ZOLOFT) tablet 25 mg  25 mg Oral q morning - 10a Lorretta Harp, MD   25 mg at 12/30/16 1106  . thiamine (VITAMIN B-1) tablet 100 mg  100 mg Oral Daily Lorretta Harp, MD   100 mg at 12/30/16 1105   Or  . thiamine (B-1) injection 100 mg  100 mg Intravenous Daily Lorretta Harp, MD      . zolpidem (AMBIEN) tablet 5 mg  5 mg Oral QHS PRN Lorretta Harp, MD       Facility-Administered Medications Ordered in Other Encounters  Medication Dose Route Frequency Provider Last Rate Last Dose  . nitroGLYCERIN 1 mg/10 ml (100 mcg/ml) - IR/CATH LAB    PRN Marykay Lex, MD   200 mcg at 10/06/15 1525    REVIEW OF SYSTEMS:   REVIEW OF SYSTEMS (negative unless checked):   Cardiac:  []  Chest pain or chest pressure? []  Shortness of breath upon activity? []  Shortness of breath when lying flat? []  Irregular heart rhythm?  Vascular:  []  Pain in calf, thigh, or hip brought on by walking? []  Pain in feet at night that wakes you up from your sleep? []  Blood clot in  your veins? []  Leg  swelling?  Pulmonary:  []  Oxygen at home? []  Productive cough? []  Wheezing?  Neurologic:  []  Sudden weakness in arms or legs? []  Sudden numbness in arms or legs? []  Sudden onset of difficult speaking or slurred speech? []  Temporary loss of vision in one eye? []  Problems with dizziness?  Gastrointestinal:  []  Blood in stool? []  Vomited blood?  Genitourinary:  []  Burning when urinating? []  Blood in urine?  Psychiatric:  []  Major depression  Hematologic:  []  Bleeding problems? []  Problems with blood clotting?  Dermatologic:  []  Rashes or ulcers?  Constitutional:  []  Fever or chills?  Ear/Nose/Throat:  []  Change in hearing? []  Nose bleeds? []  Sore throat?  Musculoskeletal:  []  Back pain? []  Joint pain? []  Muscle pain?  PHYSICAL EXAM:   Vitals:   12/30/16 0830 12/30/16 0900 12/30/16 0927 12/30/16 1100  BP: 117/73 137/75  138/75  Pulse: 87 91 95 95  Resp: 12 12 (!) 23 20  Temp:    98 F (36.7 C)  TempSrc:    Oral  SpO2: 95% 95% 96% 97%  Weight:    107 lb 14.4 oz (48.9 kg)  Height:    5\' 1"  (1.549 m)    GENERAL: The patient is a well-nourished female, in no acute distress. The vital signs are documented above. HEENT: normocephalic, atraumatic. No abnormalities noted.  CARDIAC: There is a regular rate and rhythm. No carotid bruits.  VASCULAR: Palpable radial and femoral pulses bilaterally. Non palpable pedal pulses, but feet are warm bilaterally.  PULMONARY: There is good air exchange bilaterally without wheezing or rales. ABDOMEN: Soft without distension. Mild diffuse tenderness.  MUSCULOSKELETAL: There are no major deformities or cyanosis. NEUROLOGIC: No focal weakness or paresthesias are detected. SKIN: There are no ulcers or rashes noted. PSYCHIATRIC: The patient has a normal affect.  DATA:    CT abdomen/pelvis 12/29/16  IMPRESSION: 1. Acute T9 compression fracture with approximately 40% loss of height anteriorly. There is  mild involvement of the posterior cortex with minimal retropulsion but no significant mass effect on the spinal canal. 2. No additional acute traumatic injury to the chest, abdomen, or pelvis. 3. Infrarenal abdominal aortic aneurysm has increased since exam 6 weeks prior, current maximal dimension 6.0 cm, previously 5.6 cm. No periaortic stranding to suggest rupture or acute injury. Recommend vascular surgery consultation. 4. Chronic lung findings of emphysema, multifocal scarring and basilar fibrosis.  ASSESSMENT/PLAN:   AAA without rupture  Currently abdominal and back pain unlikely related to AAA. Per Dr. Nicky Pugh review of recent CTA, there has been no change in size of AAA. Would not consider elective repair of AAA until patient recovers from her current medical issues.   Maris Berger, PA-C [facility]   Addendum  I have independently interviewed and examined the patient, and I agree with the physician assistant's findings.  Pt's main complaint at this point is pain in her buttock.  She currently denies any abdominal pain.  On abd exam, I don't get any tenderness to palpation.  There is an easily palpable AAA.  I reviewed the prior CTA and the recent CT abd/pelvis and I find minimal difference in measured AAA size.  There are differences between the methodology of measurement between the two radiologist.  I get roughly 58 mm maximal transverse diameter of the AAA on both studies.  - Doubt this patient has a sx AAA.  No indication for intervention at this point. - Pt already has a follow-up appointment with Dr. Darrick Penna  in September.  Will try to move up appointment.  Leonides Sake, MD, FACS [facility] Office: [phone] Pager: [phone]  12/30/2016, 6:17 PM

## 2016-12-30 NOTE — ED Notes (Signed)
Report received from Kristin RN

## 2016-12-31 ENCOUNTER — Encounter (HOSPITAL_COMMUNITY): Payer: Self-pay | Admitting: General Practice

## 2016-12-31 ENCOUNTER — Telehealth: Payer: Self-pay | Admitting: Vascular Surgery

## 2016-12-31 DIAGNOSIS — Z951 Presence of aortocoronary bypass graft: Secondary | ICD-10-CM

## 2016-12-31 LAB — TYPE AND SCREEN
ABO/RH(D): A POS
ANTIBODY SCREEN: NEGATIVE

## 2016-12-31 LAB — CBC
HEMATOCRIT: 35.5 % — AB (ref 36.0–46.0)
HEMOGLOBIN: 11.7 g/dL — AB (ref 12.0–15.0)
MCH: 31 pg (ref 26.0–34.0)
MCHC: 33 g/dL (ref 30.0–36.0)
MCV: 93.9 fL (ref 78.0–100.0)
Platelets: 276 10*3/uL (ref 150–400)
RBC: 3.78 MIL/uL — AB (ref 3.87–5.11)
RDW: 14.5 % (ref 11.5–15.5)
WBC: 8.9 10*3/uL (ref 4.0–10.5)

## 2016-12-31 LAB — URINE CULTURE

## 2016-12-31 LAB — BASIC METABOLIC PANEL
ANION GAP: 12 (ref 5–15)
BUN: 12 mg/dL (ref 6–20)
CHLORIDE: 103 mmol/L (ref 101–111)
CO2: 17 mmol/L — AB (ref 22–32)
Calcium: 8.8 mg/dL — ABNORMAL LOW (ref 8.9–10.3)
Creatinine, Ser: 1.07 mg/dL — ABNORMAL HIGH (ref 0.44–1.00)
GFR calc Af Amer: 55 mL/min — ABNORMAL LOW (ref >60)
GFR calc non Af Amer: 48 mL/min — ABNORMAL LOW (ref >60)
GLUCOSE: 92 mg/dL (ref 65–99)
POTASSIUM: 4.3 mmol/L (ref 3.5–5.1)
Sodium: 132 mmol/L — ABNORMAL LOW (ref 135–145)

## 2016-12-31 MED ORDER — LABETALOL HCL 5 MG/ML IV SOLN
10.0000 mg | INTRAVENOUS | Status: DC | PRN
  Administered 2016-12-31 – 2017-01-01 (×2): 10 mg via INTRAVENOUS
  Filled 2016-12-31: qty 4

## 2016-12-31 MED ORDER — METOPROLOL TARTRATE 25 MG PO TABS
25.0000 mg | ORAL_TABLET | Freq: Two times a day (BID) | ORAL | Status: DC
  Administered 2016-12-31 – 2017-01-01 (×4): 25 mg via ORAL
  Filled 2016-12-31 (×3): qty 1

## 2016-12-31 MED ORDER — LABETALOL HCL 5 MG/ML IV SOLN
INTRAVENOUS | Status: AC
  Filled 2016-12-31: qty 4

## 2016-12-31 NOTE — Progress Notes (Signed)
Orthopedic Tech Progress Note Patient Details:  Tonya Payne [birth year] [id number] Brace completed by bio-tech. Patient ID: Tonya Payne, female   DOB: Jan 08, 1935, 82 y.o.   MRN: [id number]   Tonya Payne 12/31/2016, 4:21 PM

## 2016-12-31 NOTE — Evaluation (Signed)
Physical Therapy Evaluation Patient Details Name: Tonya Payne MRN: [id number] DOB: 11-Jan-1935 Today's Date: 12/31/2016   History of Present Illness  82 y.o. female admitted from [facility] after sustaining a mechanical fall and hitting her head at home dx with T9 compression fx. Past medical history of coronary artery disease. AAA, A. fib, CABG, COPD, depression, L hip fx s/p hip pinning 09/24/16   Clinical Impression  Pt admitted with above diagnosis. Pt currently with functional limitations due to the deficits listed below (see PT Problem List). Pt limited by nausea, dizziness and weakness. Pt currently, minA for bed mobility and modA for transfers and ambulation of 2 feet to recliner. Pt will benefit from skilled PT to increase their independence and safety with mobility to allow discharge to the venue listed below.       Follow Up Recommendations SNF    Equipment Recommendations  None recommended by PT    Recommendations for Other Services OT consult     Precautions / Restrictions Precautions Precautions: Fall Restrictions Weight Bearing Restrictions: No      Mobility  Bed Mobility Overal bed mobility: Needs Assistance Bed Mobility: Supine to Sit     Supine to sit: Min assist     General bed mobility comments: minA for pad scoot of hips to EoB, vc for hand placement on bed rail to pull to upright and for LE management off of bed  Transfers Overall transfer level: Needs assistance Equipment used: 1 person hand held assist Transfers: Sit to/from Stand Sit to Stand: Mod assist         General transfer comment: modA for power up and steadying before stepping to chair   Ambulation/Gait Ambulation/Gait assistance: Mod assist Ambulation Distance (Feet): 2 Feet Assistive device: 1 person hand held assist Gait Pattern/deviations: Step-to pattern;Decreased step length - right;Decreased step length - left Gait velocity: slowed Gait velocity interpretation: Below  normal speed for age/gender General Gait Details: modA for steadying with side stepping to recliner, no buckling, no LoB      Balance Overall balance assessment: Needs assistance Sitting-balance support: No upper extremity supported;Feet supported Sitting balance-[name]: Fair     Standing balance support: Bilateral upper extremity supported Standing balance-[name]: Poor Standing balance comment: requires mod A for steadying in standing with bilateral UE support on PT                             Pertinent Vitals/Pain Pain Assessment: 0-10 Pain Score: 10-Worst pain ever Pain Location: R rib Pain Descriptors / Indicators: Constant;Jabbing;Stabbing Pain Intervention(s): Premedicated before session;Monitored during session;Limited activity within patient's tolerance    Home Living Family/patient expects to be discharged to:: Private residence Living Arrangements: Alone Available Help at Discharge: Family;Available PRN/intermittently (aide MWF 1.5 hr for cleaning ) Type of Home: Apartment Home Access: Level entry     Home Layout: One level Home Equipment: Walker - 2 wheels;Wheelchair - manual;Tub bench;Toilet riser;Grab bars - tub/shower      Prior Function Level of Independence: Needs assistance   Gait / Transfers Assistance Needed: uses walker and on days she is feeling unsteady she uses wheelchair  ADL's / Homemaking Assistance Needed: bathe and dresses herself, TV dinner meal prep, aide for cleaning           Extremity/Trunk Assessment   Upper Extremity Assessment Upper Extremity Assessment: Defer to OT evaluation    Lower Extremity Assessment Lower Extremity Assessment: Generalized weakness    Cervical /  Trunk Assessment Cervical / Trunk Assessment: Kyphotic  Communication   Communication: No difficulties  Cognition Arousal/Alertness: Awake/alert Behavior During Therapy: WFL for tasks assessed/performed Overall Cognitive Status: Within  Functional Limits for tasks assessed                                        General Comments General comments (skin integrity, edema, etc.): pt with complaints of dizziness and nausea with sitting EoB, BP in sitting 122/89, pt request to transfer to chair, nursing student administered IV Zofran.      Assessment/Plan    PT Assessment Patient needs continued PT services  PT Problem List Decreased strength;Decreased range of motion;Decreased activity tolerance;Decreased balance;Decreased mobility;Pain       PT Treatment Interventions DME instruction;Gait training;Therapeutic activities;Functional mobility training;Therapeutic exercise;Balance training;Patient/family education    PT Goals (Current goals can be found in the Care Plan section)  Acute Rehab PT Goals Patient Stated Goal: go home PT Goal Formulation: With patient Time For Goal Achievement: 01/07/17 Potential to Achieve Goals: Fair    Frequency Min 3X/week   Barriers to discharge Decreased caregiver support         AM-PAC PT "6 Clicks" Daily Activity  Outcome Measure Difficulty turning over in bed (including adjusting bedclothes, sheets and blankets)?: A Lot Difficulty moving from lying on back to sitting on the side of the bed? : A Lot Difficulty sitting down on and standing up from a chair with arms (e.g., wheelchair, bedside commode, etc,.)?: Unable Help needed moving to and from a bed to chair (including a wheelchair)?: A Lot Help needed walking in hospital room?: Total Help needed climbing 3-5 steps with a railing? : Total 6 Click Score: 9    End of Session   Activity Tolerance: Treatment limited secondary to medical complications (Comment) (pt with dizziness and nausea) Patient left: in chair;with call bell/phone within reach;with chair alarm set;with nursing/sitter in room Nurse Communication: Mobility status PT Visit Diagnosis: Unsteadiness on feet (R26.81);Other abnormalities of gait and  mobility (R26.89);Dizziness and giddiness (R42);Muscle weakness (generalized) (M62.81);Repeated falls (R29.6);History of falling (Z91.81);Difficulty in walking, not elsewhere classified (R26.2);Pain Pain - Right/Left: Right Pain - part of body:  (rib cage)    Time: 0712-1975 PT Time Calculation (min) (ACUTE ONLY): 33 min   Charges:   PT Evaluation $PT Eval Moderate Complexity: 1 Mod PT Treatments $Therapeutic Activity: 8-22 mins   PT G Codes:        Burris Matherne B. Beverely Risen PT, DPT Acute Rehabilitation  [phone] Pager [phone]    Elon Alas [facility] 12/31/2016, 12:35 PM

## 2016-12-31 NOTE — Progress Notes (Signed)
Pt MD regarding pt's BP.

## 2016-12-31 NOTE — Progress Notes (Signed)
Patient complaining of nausea. RN notified.

## 2016-12-31 NOTE — Progress Notes (Signed)
Used shampoo cap to wash patient's head. Patient reported feeling nauseated  during the process and the shampoo process was stopped. Patient still has some dried blood on occipital laceration.

## 2016-12-31 NOTE — Progress Notes (Addendum)
PROGRESS NOTE  Tonya Payne  SHF:[id number] DOB: 05-Oct-1934 DOA: 12/29/2016 PCP: Velna Ochs, MD  Outpatient Specialists:  [facility], Dr. Oneida Alar  Brief Narrative: Tonya Payne is an 82 y.o. female with a history of CAD s/p CABG, stage III CKD, HLD, TIA, COPD, and AAA who presented to the ED after tripping on the wheels of her walker in the kitchen. She fell onto her back with immediate diffuse pain worst, sharp, constant in the mid back, and back of the head where she had struck a wall on the way down. She had a scalp laceration and hematoma in the occipital area. She also endorsed dysuria and urinary frequency. On exam she had tenderness diffusely in the abdomen and the thoracic back, no focal deficits. Pyuria was noted on UA. CT head demonstrated no hemorrhage. CT chest/abdomen/pelvis was performed showing an acute T9 compression fracture and enlargement of her AAA to 6cm from 5.6cm a few weeks prior. Vascular surgery was consulted, recommending transfer to [facility] for evaluation. Ceftriaxone was given for presumptive UTI. Vascular surgery felt AAA had not changed in size and recommended ongoing follow up to discuss elective repair, though no urgent repair is required. Neurosurgery was consulted, reviewed scans, and recommended only TLSO bracing any time patient is upright. Physical therapy is recommending SNF for discharge.   Assessment & Plan: Principal Problem:   AAA (abdominal aortic aneurysm) ([place]) Active Problems:   UTI (urinary tract infection)   Hyperlipidemia LDL goal <70   S/P CABG x 3   Fall   CKD (chronic kidney disease), stage III   COPD (chronic obstructive pulmonary disease) (HCC)   Closed compression fracture of thoracic vertebra (HCC)   Abdominal pain   Alcohol use   CAD (coronary artery disease)  AAA: Acutely enlarged to 6cm from last check at 5.6cm on 11/16/2016. No imaging evidence of rupture, VSS.  - Vascular surgery doubts this is symptomatic or acutely enlarged (53m  maximal transverse diameter on both studies per Dr. CBridgett Larsson. Recommended  - Will follow up with Dr. FOneida Alarsoon to discuss elective surgery.   Fall: Mechanical fall, states she's had more imbalance lately, lives alone, had HH-PT for a while, no longer.  - PT/OT consulted. Clear to work with them per neurosurgery and [facility]. Planning SNF discharge if remains stable over next 24 hours.  - Could consider neurocognitive evaluation as an outpatient. She seems to have cognitive impairment. Currently living alone, but dependent on her nearby Son for many tasks.   Acute T9 compression fracture: CT demonstrated acute, traumatic fx with ~40% height loss.  - Discussed with neurosurgery at admission, recommended TLSO brace and follow up in Spine clinic in 4 weeks. I spoke with PA, Costella, who recommended TLSO any time the patient is upright, sitting, standing, walking, etc.  - Pain control with prn percocet, morphine, robaxin. Requiring intermittent IV medications for pain control.   Head trauma with occipital laceration: CT head without hemorrhage or fracture. s/p 3 staples to left occiput 8/26. Hemostatic.  - Remove staples in 10-14 days  Abdominal pain: Pt has persistent abdominal pain, not sure if the increased size of AAA is the reason of her abdominal pain. She could have some abdominal wall injury from fall. Lipase negative. CT scan did not show acute injury intra-abdominally or in the pelvis. - Pain control as above.   Sepsis/UTI ruled out: Initially patient met criteria for sepsis with tachycardia and tachypnea. Lactic acid is normal. Hemodynamically stable. TNTC WBC's on UA micro but culture returned  nonclonal. Suspect vital signs abnormalities purely due to fall and pain.  - DC ceftriaxone and monitor blood cultures (NGTD x2 days)   CAD: s/p remote CABG. No chest pain.  - Continue ASA, lipitor, and metoprolol.   Left ventricular systolic dysfunction: Last TEE w/EF 40-45% (10/16/2015), though  TTE 1 week prior stated no impairment in LV systolic function. Clinically has no evidence for heart failure. Will not add diagnosis of heart failure at this time.   - No diuretics at baseline.  - Monitor volume status, continue beta blocker.  Hyperlipidemia: Chronic, stable.  - Continue statin  Stage III CKD: Possibly with mild AKI, will have to determine baseline. Cr 1.14 on admission.  - Monitor BMP  COPD: Stable.  - Prn breathing treatments.   Alcohol use: Reports 1 glass nightly without binging.  - no withdrawal, discontinue CIWA.  DVT prophylaxis: SCDs Code Status: Full code, confirmed at admission. Family Communication: None at bedside; called Son without answer again today. No VM as there was no way to confirm that the number in EMR is the correct one for this patient's family. Pt unable to remember his number.  Disposition Plan: Uncertain  Consultants:   Vascular surgery, Dr. Wynetta Fines Neurosurgery  Procedures:   None  Antimicrobials:  Ceftriaxone 8/26 - 8/28  Subjective: Pt's pain is intermittently uncontrolled, particularly with movement, worst in mid back radiating bilaterally around the ribs and into abdomen. Eating ok. No N/V/D. Still has headache without neck stiffness/pain, vision changes, or numbness/weakness/tingling.   Objective: BP 122/89 (BP Location: Right Arm)   Pulse (!) 109   Temp 98.8 F (37.1 C) (Oral)   Resp (!) 24   Ht _0  (1.549 m)   Wt 47.2 kg (104 lb)   SpO2 97%   BMI 19.65 kg/m   Gen: Pleasant elderly female in no distress Pulm: Non-labored breathing room air. Clear to auscultation bilaterally.  CV: Regular rate and rhythm. No murmur, rub, or gallop. No JVD, no pedal edema. GI: Abdomen soft, with diffuse tenderness and pulsating midline mass superior to umbilicus. +BS. MSK: No gross deformities or definite spinal step offs. Tenderness along midline spine worst in thoracic/lumbar regions.  Skin: Longitudinal laceration  just to the left of the occiput without drainage or bleeding, 3 staples intact. No other rashes or wounds. Midline abdominal surgical scar and sternotomy scar noted.  Neuro: Alert and oriented to person, place, time, and situation, has difficulty recalling specifics of conversations from an hour ago. No focal neurological deficits. Psych: Judgement and insight appear fair. Mild cognitive impairment noted. Mood & affect appropriate.   Data Reviewed: I have personally reviewed following labs and imaging studies  CBC:  Recent Labs Lab 12/29/16 1929 12/30/16 0613 12/31/16 0559  WBC 11.7* 8.6 8.9  NEUTROABS 9.2*  --   --   HGB 12.4 11.6* 11.7*  HCT 35.6* 34.0* 35.5*  MCV 91.3 92.6 93.9  PLT 301 277 888   Basic Metabolic Panel:  Recent Labs Lab 12/29/16 1929 12/30/16 0613 12/31/16 0559  NA 132* 133* 132*  K 4.2 4.3 4.3  CL 101 104 103  CO2 22 21* 17*  GLUCOSE 107* 106* 92  BUN 21* 16 12  CREATININE 1.14* 0.99 1.07*  CALCIUM 9.2 9.0 8.8*   GFR: Estimated Creatinine Clearance: 31.2 mL/min (A) (by C-G formula based on SCr of 1.07 mg/dL (H)). Liver Function Tests:  Recent Labs Lab 12/29/16 1929  AST 40  ALT 28  ALKPHOS 75  BILITOT 0.6  PROT 7.2  ALBUMIN 3.7    Recent Labs Lab 12/29/16 1929  LIPASE 28   No results for input(s): AMMONIA in the last 168 hours. Coagulation Profile:  Recent Labs Lab 12/29/16 1929  INR 1.02   Cardiac Enzymes: No results for input(s): CKTOTAL, CKMB, CKMBINDEX, TROPONINI in the last 168 hours. BNP (last 3 results) No results for input(s): PROBNP in the last 8760 hours. HbA1C: No results for input(s): HGBA1C in the last 72 hours. CBG:  Recent Labs Lab 12/30/16 0817  GLUCAP 95   Lipid Profile: No results for input(s): CHOL, HDL, LDLCALC, TRIG, CHOLHDL, LDLDIRECT in the last 72 hours. Thyroid Function Tests: No results for input(s): TSH, T4TOTAL, FREET4, T3FREE, THYROIDAB in the last 72 hours. Anemia Panel: No results for  input(s): VITAMINB12, FOLATE, FERRITIN, TIBC, IRON, RETICCTPCT in the last 72 hours. Urine analysis:    Component Value Date/Time   COLORURINE YELLOW 12/29/2016 1912   APPEARANCEUR CLEAR 12/29/2016 1912   LABSPEC 1.015 12/29/2016 1912   PHURINE 6.5 12/29/2016 1912   GLUCOSEU NEGATIVE 12/29/2016 1912   HGBUR NEGATIVE 12/29/2016 [place] NEGATIVE 12/29/2016 1912   KETONESUR NEGATIVE 12/29/2016 1912   PROTEINUR TRACE (A) 12/29/2016 1912   UROBILINOGEN 0.2 02/25/2015 1826   NITRITE NEGATIVE 12/29/2016 1912   LEUKOCYTESUR MODERATE (A) 12/29/2016 1912   Recent Results (from the past 240 hour(s))  Urine Culture     Status: Abnormal   Collection Time: 12/29/16  7:12 PM  Result Value Ref Range Status   Specimen Description URINE, RANDOM  Final   Special Requests NONE  Final   Culture MULTIPLE SPECIES PRESENT, SUGGEST RECOLLECTION (A)  Final   Report Status 12/31/2016 FINAL  Final  Culture, blood (Routine X 2) w Reflex to ID Panel     Status: None (Preliminary result)   Collection Time: 12/30/16  1:32 AM  Result Value Ref Range Status   Specimen Description BLOOD RIGHT ANTECUBITAL  Final   Special Requests   Final    BOTTLES DRAWN AEROBIC AND ANAEROBIC Blood Culture adequate volume   Culture   Final    NO GROWTH 1 DAY Performed at [facility], [place] [address] [place], [place] [address]    Report Status PENDING  Incomplete  Culture, blood (Routine X 2) w Reflex to ID Panel     Status: None (Preliminary result)   Collection Time: 12/30/16  1:34 AM  Result Value Ref Range Status   Specimen Description BLOOD LEFT HAND  Final   Special Requests IN PEDIATRIC BOTTLE Blood Culture adequate volume  Final   Culture   Final    NO GROWTH 1 DAY Performed at [facility], [place] [address], [place],  [address]    Report Status PENDING  Incomplete      Radiology Studies: Ct Head Wo Contrast  Result Date: 12/29/2016 CLINICAL DATA:  Slipped and fell, headache,  trauma EXAM: CT HEAD WITHOUT CONTRAST CT CERVICAL SPINE WITHOUT CONTRAST TECHNIQUE: Multidetector CT imaging of the head and cervical spine was performed following the standard protocol without intravenous contrast. Multiplanar CT image reconstructions of the cervical spine were also generated. COMPARISON:  11/12/2016 FINDINGS: CT HEAD FINDINGS Brain: No acute territorial infarction, hemorrhage, or intracranial mass is seen. Atrophy and small vessel ischemic changes of the white matter. 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 Old left greater than right basal ganglial infarcts. Stable ventricle size. Vascular: No hyperdense vessels.  Carotid artery calcifications. Skull: No fracture or suspicious  bone lesion. Sinuses/Orbits: No acute finding. Other: Moderate left posterior scalp laceration and hematoma. CT CERVICAL SPINE FINDINGS Alignment: Trace 2 mm anterolisthesis of C3 on C4 and C7 on T1. Straightening of the cervical spine. Facet alignment is within normal limits. Skull base and vertebrae: Craniovertebral junction is intact. No displaced fracture visible. Soft tissues and spinal canal: No prevertebral fluid or swelling. No visible canal hematoma. Disc levels: Anterior plate and screw fixation from C5 through C7 with similar appearance of the hardware. Mild degenerative changes at C4-C5 and C7-T1. Upper chest: Apical emphysema and fibrosis/ scarring in the right upper lobe. Carotid artery calcification. Other: None IMPRESSION: 1. No definite CT evidence for acute intracranial abnormality. Small vessel ischemic changes of the white matter and old appearing infarcts. 2. Trace anterolisthesis of C3 on C4 and C7 on T1. No fracture is seen. Status post anterior plate and screw fixation from C5 through C7. 3. Apical emphysema and fibrosis in the right upper lobe Electronically Signed   By: Donavan Foil M.D.   On: 12/29/2016 21:37   Ct Chest W Contrast  Result Date: 12/29/2016 CLINICAL DATA:  82 year old post  fall in kitchen with abdominal tenderness and thoraco lumbar spine tenderness. EXAM: CT CHEST, ABDOMEN, AND PELVIS WITH CONTRAST TECHNIQUE: Multidetector CT imaging of the chest, abdomen and pelvis was performed following the standard protocol during bolus administration of intravenous contrast. CONTRAST:  80 cc Isovue-300 IV COMPARISON:  CT angiography of the chest, abdomen, and pelvis 11/15/2016 FINDINGS: CT CHEST FINDINGS Cardiovascular: No acute aortic injury. Moderate aortic atherosclerosis. Post CABG with calcifications of native coronary artery is. The heart is normal in size. Mediastinum/Nodes: No mediastinal hemorrhage or hematoma. No pneumomediastinum. The esophagus is decompressed. No adenopathy. Lungs/Pleura: No pneumothorax. No consolidation to suggest pulmonary contusion. Again seen emphysema and basilar honeycombing consistent with fibrosis. Multifocal areas of scarring, including subpleural nodularity in the right upper lobe is stable. Biapical pleuroparenchymal scarring. No new consolidation. No pleural fluid. Trachea and mainstem bronchi are patent. Musculoskeletal: Mild superior endplate T9 compression fracture is acute. There is minimal involvement of the posterior cortex without significant mass effect on the spinal canal. No posterior element extension. No additional fracture of the thoracic spine. Remote posterior right seventh rib fracture. No acute rib fracture. Post median sternotomy. No acute sternal fracture. Included shoulder girdles and clavicles are intact. CT ABDOMEN PELVIS FINDINGS Hepatobiliary: No hepatic injury or perihepatic hematoma. Gallbladder is unremarkable Pancreas: No pancreatic injury. Prominent pancreatic duct in the body and tail measuring 4 mm is grossly unchanged from prior exam. Homogeneous pancreatic enhancement. Spleen: No splenic injury or perisplenic hematoma. Adrenals/Urinary Tract: No adrenal hemorrhage or renal injury identified. Left greater than right renal  atrophy and parenchymal scarring. Bladder is unremarkable. Stomach/Bowel: No evidence of bowel injury or mesenteric hematoma. Colonic diverticulosis without acute inflammation. Appendix not visualized. Vascular/Lymphatic: Infrarenal abdominal aortic aneurysm, with some increase from prior exam currently 6.0 x 5.9 cm, previously 5.6 x 5.2 cm. No periaortic stranding to suggest rupture. Extensive aortic atherosclerosis of mural thrombus are unchanged. No acute vascular injury. No retroperitoneal fluid or stranding. No adenopathy. Reproductive: Status post hysterectomy. No adnexal masses. Other: No free air, free fluid, or intra-abdominal fluid collection. Musculoskeletal: No fracture of the bony pelvis or lumbar spine. Left hip pinning is again seen. No confluent body wall contusion. IMPRESSION: 1. Acute T9 compression fracture with approximately 40% loss of height anteriorly. There is mild involvement of the posterior cortex with minimal retropulsion but no significant mass effect  on the spinal canal. 2. No additional acute traumatic injury to the chest, abdomen, or pelvis. 3. Infrarenal abdominal aortic aneurysm has increased since exam 6 weeks prior, current maximal dimension 6.0 cm, previously 5.6 cm. No periaortic stranding to suggest rupture or acute injury. Recommend vascular surgery consultation. 4. Chronic lung findings of emphysema, multifocal scarring and basilar fibrosis. Electronically Signed   By: Jeb Levering M.D.   On: 12/29/2016 21:35   Ct Cervical Spine Wo Contrast  Result Date: 12/29/2016 CLINICAL DATA:  Slipped and fell, headache, trauma EXAM: CT HEAD WITHOUT CONTRAST CT CERVICAL SPINE WITHOUT CONTRAST TECHNIQUE: Multidetector CT imaging of the head and cervical spine was performed following the standard protocol without intravenous contrast. Multiplanar CT image reconstructions of the cervical spine were also generated. COMPARISON:  11/12/2016 FINDINGS: CT HEAD FINDINGS Brain: No acute  territorial infarction, hemorrhage, or intracranial mass is seen. Atrophy and small vessel ischemic changes of the white matter. 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 Old left greater than right basal ganglial infarcts. Stable ventricle size. Vascular: No hyperdense vessels.  Carotid artery calcifications. Skull: No fracture or suspicious bone lesion. Sinuses/Orbits: No acute finding. Other: Moderate left posterior scalp laceration and hematoma. CT CERVICAL SPINE FINDINGS Alignment: Trace 2 mm anterolisthesis of C3 on C4 and C7 on T1. Straightening of the cervical spine. Facet alignment is within normal limits. Skull base and vertebrae: Craniovertebral junction is intact. No displaced fracture visible. Soft tissues and spinal canal: No prevertebral fluid or swelling. No visible canal hematoma. Disc levels: Anterior plate and screw fixation from C5 through C7 with similar appearance of the hardware. Mild degenerative changes at C4-C5 and C7-T1. Upper chest: Apical emphysema and fibrosis/ scarring in the right upper lobe. Carotid artery calcification. Other: None IMPRESSION: 1. No definite CT evidence for acute intracranial abnormality. Small vessel ischemic changes of the white matter and old appearing infarcts. 2. Trace anterolisthesis of C3 on C4 and C7 on T1. No fracture is seen. Status post anterior plate and screw fixation from C5 through C7. 3. Apical emphysema and fibrosis in the right upper lobe Electronically Signed   By: Donavan Foil M.D.   On: 12/29/2016 21:37   Ct Abdomen Pelvis W Contrast  Result Date: 12/29/2016 CLINICAL DATA:  82 year old post fall in kitchen with abdominal tenderness and thoraco lumbar spine tenderness. EXAM: CT CHEST, ABDOMEN, AND PELVIS WITH CONTRAST TECHNIQUE: Multidetector CT imaging of the chest, abdomen and pelvis was performed following the standard protocol during bolus administration of intravenous contrast. CONTRAST:  80 cc Isovue-300 IV COMPARISON:  CT angiography  of the chest, abdomen, and pelvis 11/15/2016 FINDINGS: CT CHEST FINDINGS Cardiovascular: No acute aortic injury. Moderate aortic atherosclerosis. Post CABG with calcifications of native coronary artery is. The heart is normal in size. Mediastinum/Nodes: No mediastinal hemorrhage or hematoma. No pneumomediastinum. The esophagus is decompressed. No adenopathy. Lungs/Pleura: No pneumothorax. No consolidation to suggest pulmonary contusion. Again seen emphysema and basilar honeycombing consistent with fibrosis. Multifocal areas of scarring, including subpleural nodularity in the right upper lobe is stable. Biapical pleuroparenchymal scarring. No new consolidation. No pleural fluid. Trachea and mainstem bronchi are patent. Musculoskeletal: Mild superior endplate T9 compression fracture is acute. There is minimal involvement of the posterior cortex without significant mass effect on the spinal canal. No posterior element extension. No additional fracture of the thoracic spine. Remote posterior right seventh rib fracture. No acute rib fracture. Post median sternotomy. No acute sternal fracture. Included shoulder girdles and clavicles are intact. CT ABDOMEN PELVIS  FINDINGS Hepatobiliary: No hepatic injury or perihepatic hematoma. Gallbladder is unremarkable Pancreas: No pancreatic injury. Prominent pancreatic duct in the body and tail measuring 4 mm is grossly unchanged from prior exam. Homogeneous pancreatic enhancement. Spleen: No splenic injury or perisplenic hematoma. Adrenals/Urinary Tract: No adrenal hemorrhage or renal injury identified. Left greater than right renal atrophy and parenchymal scarring. Bladder is unremarkable. Stomach/Bowel: No evidence of bowel injury or mesenteric hematoma. Colonic diverticulosis without acute inflammation. Appendix not visualized. Vascular/Lymphatic: Infrarenal abdominal aortic aneurysm, with some increase from prior exam currently 6.0 x 5.9 cm, previously 5.6 x 5.2 cm. No  periaortic stranding to suggest rupture. Extensive aortic atherosclerosis of mural thrombus are unchanged. No acute vascular injury. No retroperitoneal fluid or stranding. No adenopathy. Reproductive: Status post hysterectomy. No adnexal masses. Other: No free air, free fluid, or intra-abdominal fluid collection. Musculoskeletal: No fracture of the bony pelvis or lumbar spine. Left hip pinning is again seen. No confluent body wall contusion. IMPRESSION: 1. Acute T9 compression fracture with approximately 40% loss of height anteriorly. There is mild involvement of the posterior cortex with minimal retropulsion but no significant mass effect on the spinal canal. 2. No additional acute traumatic injury to the chest, abdomen, or pelvis. 3. Infrarenal abdominal aortic aneurysm has increased since exam 6 weeks prior, current maximal dimension 6.0 cm, previously 5.6 cm. No periaortic stranding to suggest rupture or acute injury. Recommend vascular surgery consultation. 4. Chronic lung findings of emphysema, multifocal scarring and basilar fibrosis. Electronically Signed   By: Jeb Levering M.D.   On: 12/29/2016 21:35    Scheduled Meds: . aspirin EC  81 mg Oral Daily  . atorvastatin  40 mg Oral Daily  . loratadine  10 mg Oral Daily  . metoprolol tartrate  25 mg Oral BID  . multivitamin with minerals  1 tablet Oral Daily  . OLANZapine  5 mg Oral QPM  . sertraline  25 mg Oral q morning - 10a   Continuous Infusions: . cefTRIAXone (ROCEPHIN)  IV Stopped (12/30/16 2258)     LOS: 2 days   Time spent: 25 minutes.  Vance Gather, MD [facility] Pager [phone]  If 7PM-7AM, please contact night-coverage [email or web address] Password [facility] 12/31/2016, 12:14 PM

## 2016-12-31 NOTE — Telephone Encounter (Signed)
-----   Message from Sharee Pimple, RN sent at 12/31/2016  9:21 AM EDT ----- Regarding: RE: move up appt with [facility] per [facility]  Just call her and let her know this, thanks ----- Message ----- From: Jena Gauss Sent: 12/31/2016   9:16 AM To: Sharee Pimple, RN Subject: RE: move up appt with [facility] per [facility]              This appt can't be moved up with CEF, he is not in the office until the appt date 01/09/17.  ----- Message ----- From: Sharee Pimple, RN Sent: 12/31/2016   8:33 AM To: Donita Brooks Admin Pool Subject: move up appt with CEF per [facility]                    ----- Message ----- From: Fransisco Hertz, MD Sent: 12/30/2016   6:26 PM To: [facility] Charge Pool  Barry Agosta [phone] [birth year]  Level 3 consult  Pt need to move up her f/u with Dr. Darrick Penna.

## 2016-12-31 NOTE — Progress Notes (Signed)
Orthopedic Tech Progress Note Patient Details:  Tonya Payne [birth year] [id number] Called in [employer] brace order; spoke with Lynden Ang Patient ID: Tonya Payne, female   DOB: [birth year], 82 y.o.   MRN: [id number]   Nikki Dom 12/31/2016, 12:31 PM

## 2016-12-31 NOTE — Telephone Encounter (Signed)
Lm on hm# to inform pt that CEF is not in the office earlier than her sched appt.

## 2016-12-31 NOTE — Progress Notes (Signed)
OT Cancellation Note  Patient Details Name: Tonya Payne MRN: [id number] DOB: December 10, 1934   Cancelled Treatment:    Reason Eval/Treat Not Completed: Medical issues which prohibited therapy.  PT evaluated pt earlier - she now has orders for TLSO when up, and brace has been ordered, but has not been delivered.  Pt is back in bed at this time.  Will defer OT eval until brace arrives.   Meshulem [place], OTR/L [phone]   Jeani Hawking M 12/31/2016, 12:58 PM

## 2017-01-01 ENCOUNTER — Encounter: Payer: Self-pay | Admitting: Vascular Surgery

## 2017-01-01 DIAGNOSIS — S0101XA Laceration without foreign body of scalp, initial encounter: Secondary | ICD-10-CM

## 2017-01-01 DIAGNOSIS — N183 Chronic kidney disease, stage 3 (moderate): Secondary | ICD-10-CM

## 2017-01-01 DIAGNOSIS — I714 Abdominal aortic aneurysm, without rupture: Principal | ICD-10-CM

## 2017-01-01 DIAGNOSIS — S22000A Wedge compression fracture of unspecified thoracic vertebra, initial encounter for closed fracture: Secondary | ICD-10-CM

## 2017-01-01 LAB — BASIC METABOLIC PANEL
ANION GAP: 12 (ref 5–15)
BUN: 10 mg/dL (ref 6–20)
CALCIUM: 9.2 mg/dL (ref 8.9–10.3)
CO2: 22 mmol/L (ref 22–32)
Chloride: 95 mmol/L — ABNORMAL LOW (ref 101–111)
Creatinine, Ser: 1.2 mg/dL — ABNORMAL HIGH (ref 0.44–1.00)
GFR calc Af Amer: 48 mL/min — ABNORMAL LOW (ref >60)
GFR calc non Af Amer: 42 mL/min — ABNORMAL LOW (ref >60)
GLUCOSE: 119 mg/dL — AB (ref 65–99)
POTASSIUM: 3.8 mmol/L (ref 3.5–5.1)
Sodium: 129 mmol/L — ABNORMAL LOW (ref 135–145)

## 2017-01-01 MED ORDER — TRAMADOL HCL 50 MG PO TABS: 50.0000 mg | ORAL_TABLET | Freq: Four times a day (QID) | ORAL | 0 refills | Status: DC | PRN

## 2017-01-01 MED ORDER — METHOCARBAMOL 500 MG PO TABS: 500.0000 mg | ORAL_TABLET | Freq: Three times a day (TID) | ORAL | 0 refills | Status: AC | PRN

## 2017-01-01 MED ORDER — POLYETHYLENE GLYCOL 3350 17 G PO PACK: 17.0000 g | PACK | Freq: Every day | ORAL | 0 refills | Status: AC | PRN

## 2017-01-01 MED ORDER — METOPROLOL TARTRATE 25 MG PO TABS
25.0000 mg | ORAL_TABLET | Freq: Two times a day (BID) | ORAL | 3 refills | Status: DC
Start: 2017-01-01 — End: 2017-02-14

## 2017-01-01 NOTE — Evaluation (Signed)
Occupational Therapy Evaluation Patient Details Name: Lisa Fauntleroy MRN: [id number] DOB: Nov 29, 1934 Today's Date: 01/01/2017    History of Present Illness 82 y.o. female admitted from [facility] after sustaining a mechanical fall and hitting her head at home dx with T9 compression fx. Past medical history of coronary artery disease. AAA, A. fib, CABG, COPD, depression, L hip fx s/p hip pinning 09/24/16    Clinical Impression   Pt with decline in function and safety with ADLs and ADL mobility with decreased strength, balance and endurance. After stand - sit from RW to bed pt stated that she felt a little dizzy then began to lose her balance while seated. OT assisted pt to sit upright. Pt began heaving and was unable to answer question of "what is you name and address" with slurred speech and word finding difficulty,OT called for RN. After approximately 2 minutes pt began to come around and was able to speak and answer questions properly. Pt returned to supine in bed, RN arrived. RN checked pt and VSS. Pt would benefit form acute OT services to address impairments to maximize level of function and safety    Follow Up Recommendations  SNF;Supervision/Assistance - 24 hour    Equipment Recommendations  Other (comment) (TBD at next venue of care)    Recommendations for Other Services       Precautions / Restrictions Precautions Precautions: Fall Restrictions Weight Bearing Restrictions: No      Mobility Bed Mobility Overal bed mobility: Needs Assistance Bed Mobility: Supine to Sit     Supine to sit: Min assist     General bed mobility comments: minA for pad scoot of hips to EoB, vc for hand placement on bed rail to pull to upright and for LE management off of bed. Donned back brace  Transfers Overall transfer level: Needs assistance Equipment used: Rolling walker (2 wheeled) Transfers: Sit to/from Stand Sit to Stand: Mod assist         General transfer comment: mod A to   power up, pt standing with posterior lean initially due to fear of falling per pt. Unable to transfer to chair due to reports of dizziness    Balance   Sitting-balance support: Feet supported;Single extremity supported Sitting balance-[name]: Poor     Standing balance support: Bilateral upper extremity supported Standing balance-[name]: Poor Standing balance comment: pt stood x 30 seconds before asking to be seated                           ADL either performed or assessed with clinical judgement   ADL Overall ADL's : Needs assistance/impaired     Grooming: Wash/dry hands;Wash/dry face;Sitting;Min guard   Upper Body Bathing: Maximal assistance   Lower Body Bathing: Total assistance   Upper Body Dressing : Maximal assistance   Lower Body Dressing: Total assistance     Toilet Transfer Details (indicate cue type and reason): unable, per PT notes pt t mod A with transfers Toileting- Clothing Manipulation and Hygiene: Total assistance       Functional mobility during ADLs: Rolling walker;Moderate assistance General ADL Comments: Donned back brace at EOB. Pt unable to transfer to [facility] due to reports of dizziness after standing at RW x 30 seconds and requested to sit back down, then began losing balance sitting EOB     Vision Baseline Vision/History: Wears glasses Wears Glasses: Reading only Patient Visual Report: No change from baseline  Perception     Praxis      Pertinent Vitals/Pain Pain Assessment: 0-10 Pain Score: 6  Pain Location: back, R rib area Pain Descriptors / Indicators: Aching;Sore Pain Intervention(s): Limited activity within patient's tolerance;Monitored during session;Premedicated before session;Repositioned     Hand Dominance Right   Extremity/Trunk Assessment Upper Extremity Assessment Upper Extremity Assessment: Generalized weakness   Lower Extremity Assessment Lower Extremity Assessment: Defer to PT evaluation    Cervical / Trunk Assessment Cervical / Trunk Assessment: Kyphotic   Communication Communication Communication: No difficulties   Cognition Arousal/Alertness: Awake/alert Behavior During Therapy: WFL for tasks assessed/performed Overall Cognitive Status: Within Functional Limits for tasks assessed                                 General Comments: After stand - sit from RW to bed pt stated that she felt a little dizzy then began to lose her balance while seated. OT assisted pt to sit upright. Pt began heaving and was unable to answer question of "what is you name and address" with slurred speech and word finding difficulty,OT called for RN. After approximately 2 minutes pt began to come around and was able to speak and answer questions properly. Pt returned to supine in bed, RN arrived    General Comments   pt very pleasant and cooperative               Home Living Family/patient expects to be discharged to:: Skilled nursing facility Living Arrangements: Alone Available Help at Discharge: Family;Available PRN/intermittently Type of Home: Apartment Home Access: Level entry     Home Layout: One level     Bathroom Shower/Tub: Chief Strategy Officer: Standard     Home Equipment: Environmental consultant - 2 wheels;Wheelchair - manual;Tub bench;Toilet riser;Grab bars - tub/shower          Prior Functioning/Environment Level of Independence: Needs assistance  Gait / Transfers Assistance Needed: uses walker and on days she is feeling unsteady she uses wheelchair ADL's / Homemaking Assistance Needed: bathe and dresses herself, TV dinner meal prep, aide for cleaning   Comments: using rollator, has a w/c        OT Problem List: Decreased strength;Impaired balance (sitting and/or standing);Pain;Decreased activity tolerance;Decreased knowledge of use of DME or AE;Decreased coordination      OT Treatment/Interventions: Self-care/ADL training;DME and/or AE  instruction;Therapeutic activities;Balance training;Therapeutic exercise;Patient/family education;Neuromuscular education    OT Goals(Current goals can be found in the care plan section) Acute Rehab OT Goals Patient Stated Goal: go to rehab and go home OT Goal Formulation: With patient Time For Goal Achievement: 01/08/17 Potential to Achieve Goals: Good ADL Goals Pt Will Perform Grooming: with min guard assist;standing Pt Will Perform Upper Body Bathing: with mod assist;sitting Pt Will Perform Lower Body Bathing: with max assist;sitting/lateral leans Pt Will Perform Upper Body Dressing: with mod assist;sitting Pt Will Transfer to Toilet: with min assist;with min guard assist;bedside commode;stand pivot transfer Additional ADL Goal #1: Pt will complete bed mobility with min guard A to sit EOB in prep for ADLs  OT Frequency: Min 2X/week   Barriers to D/C: Decreased caregiver support          Co-evaluation              AM-PAC PT "6 Clicks" Daily Activity     Outcome Measure Help from another person eating meals?: None Help from another person taking care of personal  grooming?: A Little Help from another person toileting, which includes using toliet, bedpan, or urinal?: A Lot Help from another person bathing (including washing, rinsing, drying)?: Total Help from another person to put on and taking off regular upper body clothing?: A Lot Help from another person to put on and taking off regular lower body clothing?: Total 6 Click Score: 13   End of Session Equipment Utilized During Treatment: Gait belt;Rolling walker;Back brace Nurse Communication: Other (comment);Mobility status (RN called to room after pt unable to answer questions, slurred speech, difficulty word finding)  Activity Tolerance: Patient limited by fatigue;Other (comment) (dizziness) Patient left: in bed;with call bell/phone within reach;with nursing/sitter in room;with bed alarm set  OT Visit Diagnosis:  Unsteadiness on feet (R26.81);History of falling (Z91.81);Pain;Muscle weakness (generalized) (M62.81) Pain - Right/Left: Right (R rib area) Pain - part of body:  (back)                Time: 1001-1041 OT Time Calculation (min): 40 min Charges:  OT General Charges $OT Visit: 1 Visit OT Evaluation $OT Eval Moderate Complexity: 1 Mod OT Treatments $Therapeutic Activity: 8-22 mins G-Codes:       Galen Manila 01/01/2017, 11:12 AM

## 2017-01-01 NOTE — Progress Notes (Signed)
While working with PT, pt had an episode where she could not speak.  PT says it lasted 2 minutes.  Pt now speaking fine, VSS.  MD paged.

## 2017-01-01 NOTE — Discharge Summary (Signed)
Physician Discharge Summary  Tonya Payne ZOX:[id number] DOB: 12/14/1934 DOA: 12/29/2016  PCP: Rocky Morel, MD  Admit date: 12/29/2016 Discharge date: 01/01/2017  Admitted From:Home Disposition:SNF  Recommendations for Outpatient Follow-up:  1. Follow up with PCP in 1-2 weeks 2. Follow up with Vascular surgery and Neurosurgery outpatient 3. Please obtain BMP/CBC in 2-3 days  Home Health:SNF Equipment/Devices:Neck brace Discharge Condition:stable CODE STATUS:full code Diet recommendation:heart healthy  Brief/Interim Summary: 82 y.o. female with a history of CAD s/p CABG, stage III CKD, HLD, TIA, COPD, and AAA who presented to the ED after tripping on the wheels of her walker in the kitchen. She fell onto her back with diffuse pain, sharp, constant in the mid back, and back of the head where she had struck a wall on the way down. She had a scalp laceration and hematoma in the occipital area. She also endorsed dysuria and urinary frequency. On exam she had tenderness diffusely in the abdomen and the thoracic back, no focal deficits. Pyuria was noted on UA. CT head demonstrated no hemorrhage. CT chest/abdomen/pelvis was performed showing an acute T9 compression fracture and enlargement of her AAA to 6cm from 5.6cm a few weeks prior. Vascular surgery was consulted, recommending [facility]. Ceftriaxone was given for presumptive UTI. Vascular surgery felt AAA had not changed in size and recommended ongoing follow up to discuss elective repair, though no urgent repair is required. Neurosurgery was consulted, reviewed scans, and recommended only TLSO bracing any time patient is upright. Physical therapy is recommending SNF for discharge.  AAA: Acutely enlarged to 6cm from last check at 5.6cm on 11/16/2016. No imaging evidence of rupture.  - Vascular surgery doubts this is symptomatic or acutely enlarged (58mm maximal transverse diameter on both studies per Dr. Imogene Burn). Recommended  follow up with Dr. Darrick Penna to discuss elective surgery.   Fall: Mechanical fall, states she's had more imbalance lately, lives alone, had HH-PT for a while, no longer.  - PT/OT consulted. Discharge to SNF. Discussed with SW. - Could consider neurocognitive evaluation as an outpatient. She seems to have cognitive impairment. Currently living alone, but dependent on her nearby Son for many tasks.   Acute T9 compression fracture: CT demonstrated acute, traumatic fx with ~40% height loss.  - As per prior notes, it was discussed with neurosurgery at admission, recommended TLSO brace and follow up in Spine clinic in 4 weeks.Pain control and continue rehab. Pt has neck brace and recommended to follow up with NS.  Head trauma with occipital laceration: CT head without hemorrhage or fracture. s/p 3 staples to left occiput 8/26. Hemostatic.  - Remove staples in 10-14 days  Abdominal pain: pain improved.   Sepsis/UTI ruled out: urine culture likely contaminant. Off antibiotics.  CAD: s/p remote CABG. No chest pain.  - Continue ASA, lipitor, and metoprolol.   Left ventricular systolic dysfunction: Last TEE w/EF 40-45% (10/16/2015), though TTE 1 week prior stated no impairment in LV systolic function. Clinically has no evidence for heart failure.   - Monitor volume status, continue beta blocker.  Hyperlipidemia: Chronic, stable.  - Continue statin  Stage III CKD: patient has fluctuation in serum creatinine level likely hemodynamically mediated. Repeat lab in 2-3 days with PCP recommended.   COPD: Stable.  - Prn breathing treatments.   Alcohol use:no withdrawal sign.   Patient is clinically improved. She has neck brace. Patient has fluctuation in serum creatinine level and chronic hyponatremia. She denied headache, dizziness, nausea vomiting chest pain, shortness of breath. She  verbalized understanding of follow-up instructions. I recommended patient to monitor labs in 2-3 days to monitor  electrolytes and serum creatinine level. Avoid nephrotoxins. Recommended to follow-up with neurosurgery and vascular surgery as outpatient.  Discharge Diagnoses:  Principal Problem:   AAA (abdominal aortic aneurysm) (HCC) Active Problems:   UTI (urinary tract infection)   Hyperlipidemia LDL goal <70   S/P CABG x 3   Fall   CKD (chronic kidney disease), stage III   COPD (chronic obstructive pulmonary disease) (HCC)   Closed compression fracture of thoracic vertebra (HCC)   Abdominal pain   Alcohol use   CAD (coronary artery disease)    Discharge Instructions  Discharge Instructions    Call MD for:  difficulty breathing, headache or visual disturbances    Complete by:  As directed    Call MD for:  extreme fatigue    Complete by:  As directed    Call MD for:  hives    Complete by:  As directed    Call MD for:  persistant dizziness or light-headedness    Complete by:  As directed    Call MD for:  persistant nausea and vomiting    Complete by:  As directed    Call MD for:  severe uncontrolled pain    Complete by:  As directed    Call MD for:  temperature >100.4    Complete by:  As directed    Diet - low sodium heart healthy    Complete by:  As directed    Discharge instructions    Complete by:  As directed    Check CBC, BMP in 2-3 days with PCP.   Increase activity slowly    Complete by:  As directed      Allergies as of 01/01/2017   No Known Allergies     Medication List    TAKE these medications   aspirin EC 81 MG tablet Take 81 mg by mouth daily.   atorvastatin 40 MG tablet Commonly known as:  LIPITOR Take 1 tablet (40 mg total) by mouth daily at 6 PM. What changed:  when to take this   docusate sodium 100 MG capsule Commonly known as:  COLACE Take 1 capsule (100 mg total) by mouth 2 (two) times daily.   feeding supplement (ENSURE ENLIVE) Liqd Take 237 mLs by mouth 2 (two) times daily between meals. What changed:  when to take this   fexofenadine 180  MG tablet Commonly known as:  ALLEGRA Take 180 mg by mouth every evening.   fluticasone 50 MCG/ACT nasal spray Commonly known as:  FLONASE Place 1 spray into both nostrils daily. What changed:  when to take this  reasons to take this   megestrol 40 MG/ML suspension Commonly known as:  MEGACE Take 800 mg by mouth daily as needed for other. appetite   methocarbamol 500 MG tablet Commonly known as:  ROBAXIN Take 1 tablet (500 mg total) by mouth every 8 (eight) hours as needed for muscle spasms.   metoprolol tartrate 25 MG tablet Commonly known as:  LOPRESSOR Take 1 tablet (25 mg total) by mouth 2 (two) times daily. What changed:  how much to take   MULTIVITAMIN ADULTS PO Take 1 tablet by mouth daily.   OLANZapine 5 MG tablet Commonly known as:  ZYPREXA Take 5 mg by mouth every evening.   polyethylene glycol packet Commonly known as:  MIRALAX / GLYCOLAX Take 17 g by mouth daily as needed. What changed:  when  to take this  reasons to take this   sertraline 50 MG tablet Commonly known as:  ZOLOFT Take 0.5 tablets (25 mg total) by mouth every morning.   traMADol 50 MG tablet Commonly known as:  ULTRAM Take 1 tablet (50 mg total) by mouth every 6 (six) hours as needed. What changed:  when to take this  reasons to take this            Discharge Care Instructions        Start     Ordered   01/01/17 0000  metoprolol tartrate (LOPRESSOR) 25 MG tablet  2 times daily     01/01/17 1109   01/01/17 0000  polyethylene glycol (MIRALAX / GLYCOLAX) packet  Daily PRN     01/01/17 1109   01/01/17 0000  methocarbamol (ROBAXIN) 500 MG tablet  Every 8 hours PRN     01/01/17 1109   01/01/17 0000  Increase activity slowly     01/01/17 1109   01/01/17 0000  Diet - low sodium heart healthy     01/01/17 1109   01/01/17 0000  Discharge instructions    Comments:  Check CBC, BMP in 2-3 days with PCP.   01/01/17 1109   01/01/17 0000  Call MD for:  temperature >100.4      01/01/17 1109   01/01/17 0000  Call MD for:  persistant nausea and vomiting     01/01/17 1109   01/01/17 0000  Call MD for:  severe uncontrolled pain     01/01/17 1109   01/01/17 0000  Call MD for:  difficulty breathing, headache or visual disturbances     01/01/17 1109   01/01/17 0000  Call MD for:  hives     01/01/17 1109   01/01/17 0000  Call MD for:  persistant dizziness or light-headedness     01/01/17 1109   01/01/17 0000  Call MD for:  extreme fatigue     01/01/17 1109   01/01/17 0000  traMADol (ULTRAM) 50 MG tablet  Every 6 hours PRN     01/01/17 1124      Contact information for follow-up providers    Rocky Morel, MD. Schedule an appointment as soon as possible for a visit in 1 week(s).   Specialty:  Internal Medicine Contact information: [address] [place] [address] [phone]        Sherren Kerns, MD. Schedule an appointment as soon as possible for a visit in 2 week(s).   Specialties:  Vascular Surgery, Cardiology Contact information: [address] [place] [address] [phone]        Alyson Ingles, [place]. Schedule an appointment as soon as possible for a visit in 1 week(s).   Specialty:  Physician Assistant Why:  Neurosurgery Contact information: [address] [place] [address] [phone]            Contact information for after-discharge care    Destination    [facility] Follow up.   Specialty:  Skilled Nursing Facility Contact information: [address] [place] [address] [phone]                 No Known Allergies  Consultations:  Vascular surgery, Dr. Lottie Mussel Neurosurgery  Procedures/Studies: None  Subjective: Seen and examined at bedside. Denied headache, dizziness, nausea vomiting chest pain shortness of breath. Neck pain is better with the pain medication.  Discharge Exam: Vitals:   01/01/17 7425  01/01/17 1041  BP: 139/89 (!)  102/59  Pulse: 79 87  Resp: 18   Temp: 97.6 F (36.4 C)   SpO2: 97%    Vitals:   12/31/16 2346 01/01/17 0120 01/01/17 0626 01/01/17 1041  BP: (!) 195/116 (!) 162/96 139/89 (!) 102/59  Pulse: (!) 104 99 79 87  Resp: 18 18 18    Temp: 97.7 F (36.5 C)  97.6 F (36.4 C)   TempSrc: Oral  Oral   SpO2: 98% 97% 97%   Weight:   47.9 kg (105 lb 9.6 oz)   Height:        General: Pt is alert, awake, not in acute distress,scalp wound healing with no bleeding Cardiovascular: RRR, S1/S2 +, no rubs, no gallops Respiratory: CTA bilaterally, no wheezing, no rhonchi Abdominal: Soft, NT, ND, bowel sounds + Extremities: no edema, no cyanosis    The results of significant diagnostics from this hospitalization (including imaging, microbiology, ancillary and laboratory) are listed below for reference.     Microbiology: Recent Results (from the past 240 hour(s))  Urine Culture     Status: Abnormal   Collection Time: 12/29/16  7:12 PM  Result Value Ref Range Status   Specimen Description URINE, RANDOM  Final   Special Requests NONE  Final   Culture MULTIPLE SPECIES PRESENT, SUGGEST RECOLLECTION (A)  Final   Report Status 12/31/2016 FINAL  Final  Culture, blood (Routine X 2) w Reflex to ID Panel     Status: None (Preliminary result)   Collection Time: 12/30/16  1:32 AM  Result Value Ref Range Status   Specimen Description BLOOD RIGHT ANTECUBITAL  Final   Special Requests   Final    BOTTLES DRAWN AEROBIC AND ANAEROBIC Blood Culture adequate volume   Culture   Final    NO GROWTH 2 DAYS Performed at [facility], [address], [place], [place] [address]    Report Status PENDING  Incomplete  Culture, blood (Routine X 2) w Reflex to ID Panel     Status: None (Preliminary result)   Collection Time: 12/30/16  1:34 AM  Result Value Ref Range Status   Specimen Description BLOOD LEFT HAND  Final   Special Requests IN PEDIATRIC BOTTLE Blood Culture adequate volume  Final   Culture   Final     NO GROWTH 2 DAYS Performed at [facility], [address], [place], [place] [address]    Report Status PENDING  Incomplete     Labs: BNP (last 3 results) No results for input(s): BNP in the last 8760 hours. Basic Metabolic Panel:  Recent Labs Lab 12/29/16 1929 12/30/16 0613 12/31/16 0559 01/01/17 0724  NA 132* 133* 132* 129*  K 4.2 4.3 4.3 3.8  CL 101 104 103 95*  CO2 22 21* 17* 22  GLUCOSE 107* 106* 92 119*  BUN 21* 16 12 10   CREATININE 1.14* 0.99 1.07* 1.20*  CALCIUM 9.2 9.0 8.8* 9.2   Liver Function Tests:  Recent Labs Lab 12/29/16 1929  AST 40  ALT 28  ALKPHOS 75  BILITOT 0.6  PROT 7.2  ALBUMIN 3.7    Recent Labs Lab 12/29/16 1929  LIPASE 28   No results for input(s): AMMONIA in the last 168 hours. CBC:  Recent Labs Lab 12/29/16 1929 12/30/16 0613 12/31/16 0559  WBC 11.7* 8.6 8.9  NEUTROABS 9.2*  --   --   HGB 12.4 11.6* 11.7*  HCT 35.6* 34.0* 35.5*  MCV 91.3 92.6 93.9  PLT 301  277 276   Cardiac Enzymes: No results for input(s): CKTOTAL, CKMB, CKMBINDEX, TROPONINI in the last 168 hours. BNP: Invalid input(s): POCBNP CBG:  Recent Labs Lab 12/30/16 0817  GLUCAP 95   D-Dimer No results for input(s): DDIMER in the last 72 hours. Hgb A1c No results for input(s): HGBA1C in the last 72 hours. Lipid Profile No results for input(s): CHOL, HDL, LDLCALC, TRIG, CHOLHDL, LDLDIRECT in the last 72 hours. Thyroid function studies No results for input(s): TSH, T4TOTAL, T3FREE, THYROIDAB in the last 72 hours.  Invalid input(s): FREET3 Anemia work up No results for input(s): VITAMINB12, FOLATE, FERRITIN, TIBC, IRON, RETICCTPCT in the last 72 hours. Urinalysis    Component Value Date/Time   COLORURINE YELLOW 12/29/2016 1912   APPEARANCEUR CLEAR 12/29/2016 1912   LABSPEC 1.015 12/29/2016 1912   PHURINE 6.5 12/29/2016 1912   GLUCOSEU NEGATIVE 12/29/2016 1912   HGBUR NEGATIVE 12/29/2016 1912   BILIRUBINUR NEGATIVE 12/29/2016 1912    KETONESUR NEGATIVE 12/29/2016 1912   PROTEINUR TRACE (A) 12/29/2016 1912   UROBILINOGEN 0.2 02/25/2015 1826   NITRITE NEGATIVE 12/29/2016 1912   LEUKOCYTESUR MODERATE (A) 12/29/2016 1912   Sepsis Labs Invalid input(s): PROCALCITONIN,  WBC,  LACTICIDVEN Microbiology Recent Results (from the past 240 hour(s))  Urine Culture     Status: Abnormal   Collection Time: 12/29/16  7:12 PM  Result Value Ref Range Status   Specimen Description URINE, RANDOM  Final   Special Requests NONE  Final   Culture MULTIPLE SPECIES PRESENT, SUGGEST RECOLLECTION (A)  Final   Report Status 12/31/2016 FINAL  Final  Culture, blood (Routine X 2) w Reflex to ID Panel     Status: None (Preliminary result)   Collection Time: 12/30/16  1:32 AM  Result Value Ref Range Status   Specimen Description BLOOD RIGHT ANTECUBITAL  Final   Special Requests   Final    BOTTLES DRAWN AEROBIC AND ANAEROBIC Blood Culture adequate volume   Culture   Final    NO GROWTH 2 DAYS Performed at [facility], [address] [place], [place] [address]    Report Status PENDING  Incomplete  Culture, blood (Routine X 2) w Reflex to ID Panel     Status: None (Preliminary result)   Collection Time: 12/30/16  1:34 AM  Result Value Ref Range Status   Specimen Description BLOOD LEFT HAND  Final   Special Requests IN PEDIATRIC BOTTLE Blood Culture adequate volume  Final   Culture   Final    NO GROWTH 2 DAYS Performed at [facility], [address], [place], [place] [address]    Report Status PENDING  Incomplete     Time coordinating discharge: 31 minutes  SIGNED:   Maxie Barb, MD  [facility] 01/01/2017, 11:24 AM  If 7PM-7AM, please contact night-coverage [email or web address] Password [email or web address]

## 2017-01-01 NOTE — Clinical Social Work Placement (Signed)
   CLINICAL SOCIAL WORK PLACEMENT  NOTE  Date:  01/01/2017  Patient Details  Name: Tonya Payne MRN: [id number] Date of Birth: [birth year]  Clinical Social Work is seeking post-discharge placement for this patient at the Skilled  Nursing Facility level of care (*CSW will initial, date and re-position this form in  chart as items are completed):  Yes   Patient/family provided with [place] Clinical Social Work Department's list of facilities offering this level of care within the geographic area requested by the patient (or if unable, by the patient's family).  Yes   Patient/family informed of their freedom to choose among providers that offer the needed level of care, that participate in Medicare, Medicaid or managed care program needed by the patient, have an available bed and are willing to accept the patient.  Yes   Patient/family informed of 's ownership interest in [facility] and [facility], as well as of the fact that they are under no obligation to receive care at these facilities.  PASRR submitted to EDS on 01/01/17     PASRR number received on       Existing PASRR number confirmed on 01/01/17     FL2 transmitted to all facilities in geographic area requested by pt/family on 01/01/17     FL2 transmitted to all facilities within larger geographic area on       Patient informed that his/her managed care company has contracts with or will negotiate with certain facilities, including the following:            Patient/family informed of bed offers received.  Patient chooses bed at       Physician recommends and patient chooses bed at      Patient to be transferred to   on  .  Patient to be transferred to facility by       Patient family notified on   of transfer.  Name of family member notified:        PHYSICIAN Please sign FL2     Additional Comment:    _______________________________________________ Margarito Liner, LCSW 01/01/2017, 9:48  AM

## 2017-01-01 NOTE — Progress Notes (Signed)
Pt's BP was mostly high during the shift prn Labetalol given SBP now down to 139/89 hr 79130 . Pt had cardiac monitoring on but no orders. Pt was noncompliance when leads off and was showing asystole.Refused for RNs and other Nsg staff to reapply leads" stating leave me alone to sleep"  and get out of my room "therefore Tele  was on standby most of the shift..Marland Kitchen

## 2017-01-01 NOTE — Clinical Social Work Note (Signed)
Clinical Social Work Assessment  Patient Details  Name: Tonya Payne MRN: [id number] Date of Birth: 04-25-1935  Date of referral:  01/01/17               Reason for consult:  Facility Placement, Discharge Planning                Permission sought to share information with:  Facility Sport and exercise psychologist, Family Supports Permission granted to share information::  Yes, Verbal Permission Granted  Name::     Sheina Mcleish  Agency::  SNF's  Relationship::  Son  Contact Information:  [phone]  Housing/Transportation Living arrangements for the past 2 months:  Apartment Source of Information:  Patient, Medical Team Patient Interpreter Needed:  None Criminal Activity/Legal Involvement Pertinent to Current Situation/Hospitalization:  No - Comment as needed Significant Relationships:  Adult Children, Other Family Members Lives with:  Self Do you feel safe going back to the place where you live?  Yes Need for family participation in patient care:  Yes (Comment)  Care giving concerns:  PT recommending SNF once medically stable for discharge.   Social Worker assessment / plan:  CSW met with patient. No supports at bedside. CSW introduced role and explained that PT recommendations would be discussed. Patient is agreeable to SNF placement and first preference [facility] due to proximity to her son's work. CSW notified admissions coordinator. She will review referral and respond on the hub. No further concerns. CSW encouraged patient to contact CSW as needed. CSW will continue to follow patient for support and facilitate discharge to SNF today.  Employment status:  Retired Forensic scientist:  Medicare PT Recommendations:  [facility] / Referral to community resources:  [place]  Patient/Family's Response to care: Patient agreeable to SNF placement. Patient's son supportive and involved in patient's care. Patient appreciated social work  intervention.  Patient/Family's Understanding of and Emotional Response to Diagnosis, Current Treatment, and Prognosis:  Patient has a good understanding of the reason for admission and her need for rehab prior to returning home. Patient appears happy with hospital care.  Emotional Assessment Appearance:  Appears stated age Attitude/Demeanor/Rapport:  Other (Pleasant) Affect (typically observed):  Accepting, Appropriate, Calm, Pleasant Orientation:  Oriented to Self, Oriented to Place, Oriented to  Time, Oriented to Situation Alcohol / Substance use:  Alcohol Use Psych involvement (Current and /or in the community):  No (Comment)  Discharge Needs  Concerns to be addressed:  Care Coordination Readmission within the last 30 days:  No Current discharge risk:  Dependent with Mobility, Lives alone Barriers to Discharge:  No Barriers Identified   Candie Chroman, LCSW 01/01/2017, 9:46 AM

## 2017-01-01 NOTE — NC FL2 (Signed)
[place] MEDICAID FL2 LEVEL OF CARE SCREENING TOOL     IDENTIFICATION  Patient Name: Tonya Payne Birthdate: 30-Oct-1934 Sex: female Admission Date (Current Location): 12/29/2016  [facility] and [place] Number:  Producer, television/film/video and Address:  [facility] [place], [address] [place], [place] [address]      Provider Number: [id number]  Attending Physician Name and Address:  Maxie Barb, MD  Relative Name and Phone Number:       Current Level of Care: Hospital Recommended Level of Care: Skilled Nursing Facility Prior Approval Number:    Date Approved/Denied:   PASRR Number: [id number]  Discharge Plan: SNF    Current Diagnoses: Patient Active Problem List   Diagnosis Date Noted  . COPD (chronic obstructive pulmonary disease) (HCC) 12/30/2016  . Closed compression fracture of thoracic vertebra (HCC) 12/30/2016  . Abdominal pain 12/30/2016  . Alcohol use 12/30/2016  . CAD (coronary artery disease) 12/30/2016  . Scalp laceration   . Acute renal failure superimposed on stage 3 chronic kidney disease (HCC)   . Dehydration   . Physical deconditioning   . Pressure injury of skin 11/12/2016  . CKD (chronic kidney disease), stage III 09/24/2016  . Closed left hip fracture, with routine healing, subsequent encounter 09/23/2016  . Hyponatremia 09/23/2016  . Hypertension 09/23/2016  . Left displaced femoral neck fracture (HCC)   . Closed fracture of multiple pubic rami, right, sequela 02/27/2016  . Displaced fracture of neck of right second metacarpal bone with routine healing 02/27/2016  . Protein-calorie malnutrition, severe (HCC) 02/22/2016  . Orthostatic dizziness 02/22/2016  . Postoperative atrial fibrillation (HCC) 02/22/2016  . Fall   . Hand fracture   . Pelvic fracture (HCC) 01/26/2016  . AAA (abdominal aortic aneurysm) (HCC) 11/30/2015  . Post PTCA   . S/P CABG x 3 10/16/2015  . Coronary artery disease involving native coronary  artery with angina pectoris (HCC) 10/08/2015  . Congestive dilated cardiomyopathy (HCC) 10/08/2015  . Hyperlipidemia LDL goal <70 10/08/2015  . UTI (urinary tract infection) 10/06/2015  . Myocardial infarction involving left anterior descending (LAD) coronary artery (HCC) 10/06/2015    Orientation RESPIRATION BLADDER Height & Weight     Self, Situation, Time, Place  Normal Continent, External catheter Weight: 105 lb 9.6 oz (47.9 kg) Height:  5\' 1"  (154.9 cm)  BEHAVIORAL SYMPTOMS/MOOD NEUROLOGICAL BOWEL NUTRITION STATUS   (None)  (None) Continent Diet (Heart healthy)  AMBULATORY STATUS COMMUNICATION OF NEEDS Skin   Limited Assist Verbally Bruising, Other (Comment) (MASD. Head laceration: No dressing.)                       Personal Care Assistance Level of Assistance  Bathing, Feeding, Dressing Bathing Assistance: Limited assistance Feeding assistance: Independent Dressing Assistance: Limited assistance     Functional Limitations Info  Sight, Hearing, Speech Sight Info: Adequate Hearing Info: Adequate Speech Info: Adequate    SPECIAL CARE FACTORS FREQUENCY  PT (By licensed PT), Blood pressure, OT (By licensed OT)     PT Frequency: 5 x week OT Frequency: 5 x week            Contractures Contractures Info: Not present    Additional Factors Info  Code Status, Allergies Code Status Info: Full Allergies Info: NKDA           Current Medications (01/01/2017):  This is the current hospital active medication list Current Facility-Administered Medications  Medication Dose Route Frequency Provider Last Rate Last Dose  .  acetaminophen (TYLENOL) tablet 650 mg  650 mg Oral Q6H PRN Lorretta Harp, MD       Or  . acetaminophen (TYLENOL) suppository 650 mg  650 mg Rectal Q6H PRN Lorretta Harp, MD      . albuterol (PROVENTIL) (2.5 MG/3ML) 0.083% nebulizer solution 2.5 mg  2.5 mg Nebulization Q4H PRN Lorretta Harp, MD      . aspirin EC tablet 81 mg  81 mg Oral Daily Lorretta Harp, MD    81 mg at 01/01/17 0820  . atorvastatin (LIPITOR) tablet 40 mg  40 mg Oral Daily Lorretta Harp, MD   40 mg at 01/01/17 1610  . fluticasone (FLONASE) 50 MCG/ACT nasal spray 1 spray  1 spray Each Nare Daily PRN Lorretta Harp, MD      . labetalol (NORMODYNE,TRANDATE) injection 10 mg  10 mg Intravenous Q2H PRN Tyrone Nine, MD   10 mg at 01/01/17 0015  . loratadine (CLARITIN) tablet 10 mg  10 mg Oral Daily Lorretta Harp, MD   10 mg at 01/01/17 9604  . methocarbamol (ROBAXIN) tablet 500 mg  500 mg Oral Q8H PRN Lorretta Harp, MD      . metoprolol tartrate (LOPRESSOR) tablet 25 mg  25 mg Oral BID Tyrone Nine, MD   25 mg at 01/01/17 0820  . morphine 2 MG/ML injection 1 mg  1 mg Intravenous Q3H PRN Lorretta Harp, MD   1 mg at 01/01/17 0015  . multivitamin with minerals tablet 1 tablet  1 tablet Oral Daily Lorretta Harp, MD   1 tablet at 01/01/17 0820  . OLANZapine (ZYPREXA) tablet 5 mg  5 mg Oral QPM Lorretta Harp, MD   5 mg at 12/31/16 1807  . ondansetron (ZOFRAN) injection 4 mg  4 mg Intravenous Q8H PRN Lorretta Harp, MD   4 mg at 12/31/16 1128  . oxyCODONE-acetaminophen (PERCOCET/ROXICET) 5-325 MG per tablet 1 tablet  1 tablet Oral Q4H PRN Lorretta Harp, MD   1 tablet at 01/01/17 0932  . polyethylene glycol (MIRALAX / GLYCOLAX) packet 17 g  17 g Oral Daily PRN Lorretta Harp, MD      . sertraline (ZOLOFT) tablet 25 mg  25 mg Oral q morning - 10a Lorretta Harp, MD   25 mg at 01/01/17 0820   Facility-Administered Medications Ordered in Other Encounters  Medication Dose Route Frequency Provider Last Rate Last Dose  . nitroGLYCERIN 1 mg/10 ml (100 mcg/ml) - IR/CATH LAB    PRN Marykay Lex, MD   200 mcg at 10/06/15 1525     Discharge Medications: Please see discharge summary for a list of discharge medications.  Relevant Imaging Results:  Relevant Lab Results:   Additional Information SS#: [id number]. Was admitted to [facility] on 5/24.  Margarito Liner, LCSW

## 2017-01-01 NOTE — Clinical Social Work Placement (Signed)
   CLINICAL SOCIAL WORK PLACEMENT  NOTE  Date:  01/01/2017  Patient Details  Name: Tonya Payne MRN: [id number] Date of Birth: [birth year]  Clinical Social Work is seeking post-discharge placement for this patient at the Skilled  Nursing Facility level of care (*CSW will initial, date and re-position this form in  chart as items are completed):  Yes   Patient/family provided with [place] Clinical Social Work Department's list of facilities offering this level of care within the geographic area requested by the patient (or if unable, by the patient's family).  Yes   Patient/family informed of their freedom to choose among providers that offer the needed level of care, that participate in Medicare, Medicaid or managed care program needed by the patient, have an available bed and are willing to accept the patient.  Yes   Patient/family informed of [facility]'s ownership interest in [facility] and [facility], as well as of the fact that they are under no obligation to receive care at these facilities.  PASRR submitted to EDS on 01/01/17     PASRR number received on       Existing PASRR number confirmed on 01/01/17     FL2 transmitted to all facilities in geographic area requested by pt/family on 01/01/17     FL2 transmitted to all facilities within larger geographic area on       Patient informed that his/her managed care company has contracts with or will negotiate with certain facilities, including the following:        Yes   Patient/family informed of bed offers received.  [facility]     Physician recommends and patient chooses bed at      [facility] on 01[facility]01/[facility].  Patient to be transferred to facility by [facility]     Patient family notified on 01/01/17 of transfer.  Name of family member notified:  Matilde Sprangimothy Scherzinger     PHYSICIAN Please prepare prescriptions     Additional Comment:     _______________________________________________ Margarito LinerSarah C Johne Buckle, LCSW 01/01/2017, 12:03 PM

## 2017-01-01 NOTE — Clinical Social Work Note (Signed)
[facility] today. CSW left voicemail for patient's son and paged MD to notify.  Charlynn CourtSarah Quade Ramirez, CSW [phone]

## 2017-01-01 NOTE — Progress Notes (Signed)
OT Cancellation Note  Patient Details Name: Julio AlmSuann Beichner MRN: [id number] DOB: 01-16-1935   Cancelled Treatment:    Reason Eval/Treat Not Completed: Pain limiting ability to participate. Pt reports 8/10 pain and states that she will try after getting pain meds. OT spoke with pt's RN and RN will give pain meds and OT will return shortly  Galen ManilaSpencer, Ashar Lewinski Jeanette 01/01/2017, 9:37 AM

## 2017-01-01 NOTE — Clinical Social Work Note (Signed)
CSW facilitated patient discharge including contacting patient family and facility to confirm patient discharge plans. Clinical information faxed to facility and family agreeable with plan. CSW arranged ambulance transport via [facility] to [facility]. RN to call report prior to discharge ([phone]).  CSW will sign off for now as social work intervention is no longer needed. Please consult us again if new needs arise.  Shiah Berhow, CSW [phone]   

## 2017-01-04 LAB — CULTURE, BLOOD (ROUTINE X 2)
CULTURE: NO GROWTH
Culture: NO GROWTH
SPECIAL REQUESTS: ADEQUATE
SPECIAL REQUESTS: ADEQUATE

## 2017-01-09 ENCOUNTER — Ambulatory Visit: Payer: Medicare Other | Admitting: Vascular Surgery

## 2017-01-29 ENCOUNTER — Emergency Department (HOSPITAL_COMMUNITY): Payer: Medicare Other

## 2017-01-29 ENCOUNTER — Inpatient Hospital Stay (HOSPITAL_COMMUNITY)
Admission: EM | Admit: 2017-01-29 | Discharge: 2017-02-03 | DRG: 641 | Disposition: A | Discharge status: Skilled Nursing Facility | Payer: Medicare Other | Attending: Internal Medicine | Admitting: Internal Medicine

## 2017-01-29 ENCOUNTER — Encounter (HOSPITAL_COMMUNITY): Payer: Self-pay

## 2017-01-29 DIAGNOSIS — E785 Hyperlipidemia, unspecified: Secondary | ICD-10-CM | POA: Diagnosis present

## 2017-01-29 DIAGNOSIS — R109 Unspecified abdominal pain: Secondary | ICD-10-CM

## 2017-01-29 DIAGNOSIS — I4891 Unspecified atrial fibrillation: Secondary | ICD-10-CM | POA: Diagnosis present

## 2017-01-29 DIAGNOSIS — Z951 Presence of aortocoronary bypass graft: Secondary | ICD-10-CM

## 2017-01-29 DIAGNOSIS — N183 Chronic kidney disease, stage 3 unspecified: Secondary | ICD-10-CM | POA: Diagnosis present

## 2017-01-29 DIAGNOSIS — E861 Hypovolemia: Secondary | ICD-10-CM | POA: Diagnosis present

## 2017-01-29 DIAGNOSIS — I2510 Atherosclerotic heart disease of native coronary artery without angina pectoris: Secondary | ICD-10-CM | POA: Diagnosis present

## 2017-01-29 DIAGNOSIS — Z7951 Long term (current) use of inhaled steroids: Secondary | ICD-10-CM

## 2017-01-29 DIAGNOSIS — I10 Essential (primary) hypertension: Secondary | ICD-10-CM | POA: Diagnosis not present

## 2017-01-29 DIAGNOSIS — I493 Ventricular premature depolarization: Secondary | ICD-10-CM | POA: Diagnosis present

## 2017-01-29 DIAGNOSIS — E860 Dehydration: Principal | ICD-10-CM | POA: Diagnosis present

## 2017-01-29 DIAGNOSIS — K5900 Constipation, unspecified: Secondary | ICD-10-CM

## 2017-01-29 DIAGNOSIS — Z7982 Long term (current) use of aspirin: Secondary | ICD-10-CM

## 2017-01-29 DIAGNOSIS — F329 Major depressive disorder, single episode, unspecified: Secondary | ICD-10-CM | POA: Diagnosis present

## 2017-01-29 DIAGNOSIS — Z87440 Personal history of urinary (tract) infections: Secondary | ICD-10-CM

## 2017-01-29 DIAGNOSIS — J449 Chronic obstructive pulmonary disease, unspecified: Secondary | ICD-10-CM | POA: Diagnosis present

## 2017-01-29 DIAGNOSIS — R42 Dizziness and giddiness: Secondary | ICD-10-CM

## 2017-01-29 DIAGNOSIS — N390 Urinary tract infection, site not specified: Secondary | ICD-10-CM | POA: Diagnosis present

## 2017-01-29 DIAGNOSIS — I129 Hypertensive chronic kidney disease with stage 1 through stage 4 chronic kidney disease, or unspecified chronic kidney disease: Secondary | ICD-10-CM | POA: Diagnosis present

## 2017-01-29 DIAGNOSIS — M549 Dorsalgia, unspecified: Secondary | ICD-10-CM

## 2017-01-29 DIAGNOSIS — I714 Abdominal aortic aneurysm, without rupture, unspecified: Secondary | ICD-10-CM | POA: Diagnosis present

## 2017-01-29 DIAGNOSIS — R000 Tachycardia, unspecified: Secondary | ICD-10-CM | POA: Diagnosis present

## 2017-01-29 DIAGNOSIS — E871 Hypo-osmolality and hyponatremia: Secondary | ICD-10-CM | POA: Diagnosis not present

## 2017-01-29 DIAGNOSIS — I420 Dilated cardiomyopathy: Secondary | ICD-10-CM | POA: Diagnosis present

## 2017-01-29 DIAGNOSIS — R112 Nausea with vomiting, unspecified: Secondary | ICD-10-CM | POA: Diagnosis not present

## 2017-01-29 DIAGNOSIS — Z79899 Other long term (current) drug therapy: Secondary | ICD-10-CM

## 2017-01-29 DIAGNOSIS — R55 Syncope and collapse: Secondary | ICD-10-CM | POA: Diagnosis not present

## 2017-01-29 DIAGNOSIS — D649 Anemia, unspecified: Secondary | ICD-10-CM | POA: Diagnosis present

## 2017-01-29 DIAGNOSIS — Z8673 Personal history of transient ischemic attack (TIA), and cerebral infarction without residual deficits: Secondary | ICD-10-CM

## 2017-01-29 DIAGNOSIS — I951 Orthostatic hypotension: Secondary | ICD-10-CM | POA: Diagnosis present

## 2017-01-29 DIAGNOSIS — Z66 Do not resuscitate: Secondary | ICD-10-CM | POA: Diagnosis present

## 2017-01-29 DIAGNOSIS — Z87891 Personal history of nicotine dependence: Secondary | ICD-10-CM

## 2017-01-29 DIAGNOSIS — F419 Anxiety disorder, unspecified: Secondary | ICD-10-CM | POA: Diagnosis present

## 2017-01-29 DIAGNOSIS — B9620 Unspecified Escherichia coli [E. coli] as the cause of diseases classified elsewhere: Secondary | ICD-10-CM | POA: Diagnosis present

## 2017-01-29 DIAGNOSIS — K580 Irritable bowel syndrome with diarrhea: Secondary | ICD-10-CM | POA: Diagnosis present

## 2017-01-29 DIAGNOSIS — N3000 Acute cystitis without hematuria: Secondary | ICD-10-CM | POA: Diagnosis not present

## 2017-01-29 DIAGNOSIS — Z90710 Acquired absence of both cervix and uterus: Secondary | ICD-10-CM

## 2017-01-29 DIAGNOSIS — I160 Hypertensive urgency: Secondary | ICD-10-CM | POA: Diagnosis present

## 2017-01-29 DIAGNOSIS — Z9049 Acquired absence of other specified parts of digestive tract: Secondary | ICD-10-CM

## 2017-01-29 DIAGNOSIS — Z8701 Personal history of pneumonia (recurrent): Secondary | ICD-10-CM

## 2017-01-29 DIAGNOSIS — Z823 Family history of stroke: Secondary | ICD-10-CM

## 2017-01-29 DIAGNOSIS — R197 Diarrhea, unspecified: Secondary | ICD-10-CM | POA: Diagnosis not present

## 2017-01-29 DIAGNOSIS — I252 Old myocardial infarction: Secondary | ICD-10-CM

## 2017-01-29 HISTORY — DX: Syncope and collapse: R55

## 2017-01-29 LAB — BASIC METABOLIC PANEL
ANION GAP: 10 (ref 5–15)
BUN: 9 mg/dL (ref 6–20)
CO2: 23 mmol/L (ref 22–32)
Calcium: 9.2 mg/dL (ref 8.9–10.3)
Chloride: 98 mmol/L — ABNORMAL LOW (ref 101–111)
Creatinine, Ser: 1.25 mg/dL — ABNORMAL HIGH (ref 0.44–1.00)
GFR calc Af Amer: 46 mL/min — ABNORMAL LOW (ref >60)
GFR, EST NON AFRICAN AMERICAN: 40 mL/min — AB (ref >60)
GLUCOSE: 120 mg/dL — AB (ref 65–99)
POTASSIUM: 4.3 mmol/L (ref 3.5–5.1)
Sodium: 131 mmol/L — ABNORMAL LOW (ref 135–145)

## 2017-01-29 LAB — HEPATIC FUNCTION PANEL
ALBUMIN: 3.3 g/dL — AB (ref 3.5–5.0)
ALK PHOS: 107 U/L (ref 38–126)
ALT: 18 U/L (ref 14–54)
AST: 31 U/L (ref 15–41)
Bilirubin, Direct: <0.1 mg/dL — ABNORMAL LOW (ref 0.1–0.5)
TOTAL PROTEIN: 7.3 g/dL (ref 6.5–8.1)
Total Bilirubin: 0.7 mg/dL (ref 0.3–1.2)

## 2017-01-29 LAB — URINALYSIS, ROUTINE W REFLEX MICROSCOPIC
BILIRUBIN URINE: NEGATIVE
Glucose, UA: NEGATIVE mg/dL
Hgb urine dipstick: NEGATIVE
Ketones, ur: NEGATIVE mg/dL
NITRITE: NEGATIVE
PROTEIN: 30 mg/dL — AB
Specific Gravity, Urine: 1.016 (ref 1.005–1.030)
Squamous Epithelial / LPF: NONE SEEN
pH: 7 (ref 5.0–8.0)

## 2017-01-29 LAB — CBC
HEMATOCRIT: 36.3 % (ref 36.0–46.0)
HEMOGLOBIN: 11.8 g/dL — AB (ref 12.0–15.0)
MCH: 30.2 pg (ref 26.0–34.0)
MCHC: 32.5 g/dL (ref 30.0–36.0)
MCV: 92.8 fL (ref 78.0–100.0)
Platelets: 265 10*3/uL (ref 150–400)
RBC: 3.91 MIL/uL (ref 3.87–5.11)
RDW: 12.8 % (ref 11.5–15.5)
WBC: 11.4 10*3/uL — ABNORMAL HIGH (ref 4.0–10.5)

## 2017-01-29 LAB — I-STAT TROPONIN, ED
TROPONIN I, POC: 0 ng/mL (ref 0.00–0.08)
Troponin i, poc: 0 ng/mL (ref 0.00–0.08)

## 2017-01-29 LAB — LIPASE, BLOOD: Lipase: 30 U/L (ref 11–51)

## 2017-01-29 LAB — CBG MONITORING, ED: GLUCOSE-CAPILLARY: 108 mg/dL — AB (ref 65–99)

## 2017-01-29 MED ORDER — METHOCARBAMOL 500 MG PO TABS: 500.0000 mg | ORAL_TABLET | Freq: Three times a day (TID) | ORAL | Status: DC | PRN

## 2017-01-29 MED ORDER — ATORVASTATIN CALCIUM 40 MG PO TABS
40.0000 mg | ORAL_TABLET | Freq: Every day | ORAL | Status: DC
  Administered 2017-01-30 – 2017-02-03 (×5): 40 mg via ORAL
  Filled 2017-01-29 (×5): qty 1

## 2017-01-29 MED ORDER — FLUTICASONE PROPIONATE 50 MCG/ACT NA SUSP
1.0000 | Freq: Every day | NASAL | Status: DC
  Administered 2017-01-30 – 2017-02-03 (×5): 1 via NASAL
  Filled 2017-01-29: qty 16

## 2017-01-29 MED ORDER — ACETAMINOPHEN 650 MG RE SUPP: 650.0000 mg | Freq: Four times a day (QID) | RECTAL | Status: DC | PRN

## 2017-01-29 MED ORDER — ACETAMINOPHEN 325 MG PO TABS: 650.0000 mg | ORAL_TABLET | Freq: Four times a day (QID) | ORAL | Status: DC | PRN

## 2017-01-29 MED ORDER — IOPAMIDOL (ISOVUE-370) INJECTION 76%
INTRAVENOUS | Status: AC
  Administered 2017-01-29: 80 mL
  Filled 2017-01-29: qty 100

## 2017-01-29 MED ORDER — LORATADINE 10 MG PO TABS
10.0000 mg | ORAL_TABLET | Freq: Every day | ORAL | Status: DC
Start: 2017-01-30 — End: 2017-02-03
  Administered 2017-01-30 – 2017-02-03 (×5): 10 mg via ORAL
  Filled 2017-01-29 (×5): qty 1

## 2017-01-29 MED ORDER — TRAMADOL HCL 50 MG PO TABS
50.0000 mg | ORAL_TABLET | Freq: Four times a day (QID) | ORAL | Status: DC | PRN
  Administered 2017-01-30 – 2017-02-02 (×6): 50 mg via ORAL
  Filled 2017-01-29 (×6): qty 1

## 2017-01-29 MED ORDER — ONDANSETRON HCL 4 MG/2ML IJ SOLN: 4.0000 mg | Freq: Four times a day (QID) | INTRAMUSCULAR | Status: DC | PRN

## 2017-01-29 MED ORDER — ONDANSETRON HCL 4 MG/2ML IJ SOLN
4.0000 mg | Freq: Once | INTRAMUSCULAR | Status: AC
  Administered 2017-01-29: 4 mg via INTRAVENOUS
  Filled 2017-01-29: qty 2

## 2017-01-29 MED ORDER — OXYCODONE HCL 5 MG PO TABS
5.0000 mg | ORAL_TABLET | ORAL | Status: DC | PRN
  Administered 2017-01-30 – 2017-02-03 (×9): 5 mg via ORAL
  Filled 2017-01-29 (×9): qty 1

## 2017-01-29 MED ORDER — SERTRALINE HCL 25 MG PO TABS
25.0000 mg | ORAL_TABLET | Freq: Every morning | ORAL | Status: DC
  Administered 2017-01-30 – 2017-02-03 (×5): 25 mg via ORAL
  Filled 2017-01-29 (×6): qty 1

## 2017-01-29 MED ORDER — DEXTROSE 5 % IV SOLN
1.0000 g | INTRAVENOUS | Status: DC
  Administered 2017-01-30 – 2017-01-31 (×2): 1 g via INTRAVENOUS
  Filled 2017-01-29 (×3): qty 1

## 2017-01-29 MED ORDER — ONDANSETRON HCL 4 MG PO TABS: 4.0000 mg | ORAL_TABLET | Freq: Four times a day (QID) | ORAL | Status: DC | PRN

## 2017-01-29 MED ORDER — METOPROLOL TARTRATE 25 MG PO TABS
25.0000 mg | ORAL_TABLET | Freq: Two times a day (BID) | ORAL | Status: DC
  Administered 2017-01-30 – 2017-02-03 (×9): 25 mg via ORAL
  Filled 2017-01-29 (×9): qty 1

## 2017-01-29 MED ORDER — DEXTROSE 5 % IV SOLN
1.0000 g | Freq: Once | INTRAVENOUS | Status: AC
  Administered 2017-01-29: 1 g via INTRAVENOUS
  Filled 2017-01-29: qty 10

## 2017-01-29 MED ORDER — ASPIRIN EC 81 MG PO TBEC
81.0000 mg | DELAYED_RELEASE_TABLET | Freq: Every day | ORAL | Status: DC
  Administered 2017-01-30 – 2017-02-03 (×5): 81 mg via ORAL
  Filled 2017-01-29 (×5): qty 1

## 2017-01-29 MED ORDER — LABETALOL HCL 5 MG/ML IV SOLN
10.0000 mg | Freq: Once | INTRAVENOUS | Status: AC
  Administered 2017-01-29: 10 mg via INTRAVENOUS
  Filled 2017-01-29: qty 4

## 2017-01-29 MED ORDER — SODIUM CHLORIDE 0.9 % IV BOLUS (SEPSIS)
1000.0000 mL | Freq: Once | INTRAVENOUS | Status: AC
  Administered 2017-01-29: 1000 mL via INTRAVENOUS

## 2017-01-29 MED ORDER — SODIUM CHLORIDE 0.9 % IV SOLN
INTRAVENOUS | Status: AC
  Administered 2017-01-30 (×2): via INTRAVENOUS

## 2017-01-29 MED ORDER — SODIUM CHLORIDE 0.9% FLUSH
3.0000 mL | Freq: Two times a day (BID) | INTRAVENOUS | Status: DC
  Administered 2017-01-30 – 2017-01-31 (×4): 3 mL via INTRAVENOUS

## 2017-01-29 MED ORDER — HEPARIN SODIUM (PORCINE) 5000 UNIT/ML IJ SOLN
5000.0000 [IU] | Freq: Three times a day (TID) | INTRAMUSCULAR | Status: DC
  Administered 2017-01-30 – 2017-02-03 (×13): 5000 [IU] via SUBCUTANEOUS
  Filled 2017-01-29 (×13): qty 1

## 2017-01-29 MED ORDER — SODIUM CHLORIDE 0.9 % IV BOLUS (SEPSIS)
250.0000 mL | Freq: Once | INTRAVENOUS | Status: AC
  Administered 2017-01-30: 250 mL via INTRAVENOUS

## 2017-01-29 MED ORDER — OLANZAPINE 5 MG PO TABS
5.0000 mg | ORAL_TABLET | Freq: Every day | ORAL | Status: DC
  Administered 2017-01-30 – 2017-02-02 (×5): 5 mg via ORAL
  Filled 2017-01-29 (×7): qty 1

## 2017-01-29 NOTE — ED Triage Notes (Addendum)
Pt arrive EMS from NH where she had syncope while riding recumbent bike as part of rehab for recent back surg. Pt denies chest pain or shob but states recent n/v/d with passing out several times over last 2 days. EMS states orthostatic changes.

## 2017-01-29 NOTE — ED Notes (Signed)
Patient stated she is not comfortable standing to do orthostatic vital signs.

## 2017-01-29 NOTE — ED Notes (Signed)
Patient transported to CT 

## 2017-01-29 NOTE — ED Notes (Signed)
ED Providers at bedside

## 2017-01-29 NOTE — ED Notes (Signed)
This RN walked into room and pt had vomitted on floor and continuously gagging. Pt extremely tachy & tachypneic. EDP made aware.

## 2017-01-29 NOTE — ED Notes (Signed)
Patient stated she was too nauseous to stand to complete the orthostatic vital signs. Patient requested to remain in sitting position.

## 2017-01-29 NOTE — Progress Notes (Signed)
Pharmacy Antibiotic Note  Tonya Payne is a 82 y.o. female admitted on 01/29/2017 with UTI.  Pharmacy has been consulted for Cefepime dosing. Pt from NH with syncope. WBC 11.4. CrCl ~25.   Plan: -Cefepime 1g IV q24h -F/U urine culture for directed therapy  Height: 5' (152.4 cm) Weight: 100 lb (45.4 kg) IBW/kg (Calculated) : 45.5  Temp (24hrs), Avg:98.8 F (37.1 C), Min:98.8 F (37.1 C), Max:98.8 F (37.1 C)   Recent Labs Lab 01/29/17 1343  WBC 11.4*  CREATININE 1.25*    Estimated Creatinine Clearance: 25.7 mL/min (A) (by C-G formula based on SCr of 1.25 mg/dL (H)).    No Known Allergies   Abran Duke 01/29/2017 11:35 PM

## 2017-01-29 NOTE — H&P (Signed)
History and Physical    Tonya Payne ZOX:[id number] DOB: 1935/01/21 DOA: 01/29/2017  PCP: Rocky Morel, MD   Patient coming from: SNF   Chief Complaint: Syncope, N/V/D  HPI: Syna Gad is a 82 y.o. female with medical history significant for CAD status post CABG, AAA, chronic kidney disease stage III, and admission last month for fall with acute thoracic compression fracture, discharged to SNF for rehabilitation, and now presenting after a syncopal episode. Patient reports that she had initially been doing well at the rehabilitation facility, but reports developing nausea with vomiting and non-bloody diarrhea over the past 2 days. She reports lightheadedness upon standing associated with this, reports that she has suffered a loss of consciousness multiple times over the last 2 days, typically upon standing. This afternoon, she was reportedly exercising on a recumbent bike at the rehabilitation facility, stood up, and suffered a brief loss of consciousness without hitting her head. EMS was called for transport to the hospital. She denies fevers or chills, denies chest pain, denies shortness of breath or cough, and denies abdominal pain. She does not know of any other residents at her facility with similar GI illness. Prior to the last 2 days, she does not recall ever having a syncopal episode before.  ED Course: Upon arrival to the ED, patient is found to be afebrile, saturating well on room air, and with vital signs otherwise stable. Patient later developed acute vomiting with jump in her heart rate to 130s and marked elevation in blood pressure. EKG features a sinus tachycardia with rate 127, PVCs, and repolarization abnormality in the lateral leads. Chest x-ray is notable for stable cardiomegaly and emphysematous changes. Chemistry panel features a sodium of 131 and creatinine 1.25, up from 112.2 range last month. CBC is notable for a leukocytosis to 11,400 and a stable mild normocytic anemia  with hemoglobin of 11.8. Urinalysis is suggestive of infection and will be sent for culture. Troponin was undetectable 2 more than 4 hours apart. CTA of the chest abdomen and pelvis is obtained and negative for acute pathology, but notable for stable infrarenal fusiform AAA without appreciable change since the CT performed one month ago. Patient was treated with empiric Rocephin for suspected UTI, given a dose of 10 mg labetalol, had systolic blood pressure drop into the 70s, and was treated with a liter of normal saline. She remains slightly tachycardic and with low normal blood pressure. She is not in any apparent respiratory distress. She will be observed on telemetry unit for ongoing evaluation and management of recurrent syncope upon standing in the setting of nausea, vomiting, and diarrhea for the past 2 days.  Review of Systems:  All other systems reviewed and apart from HPI, are negative.  Past Medical History:  Diagnosis Date  . AAA (abdominal aortic aneurysm) without rupture (HCC) 2016   CTA 10/23/2015 - saccular infrarenal aneurysm roughly 4.8 cm in diameter. Stable from 2016 -- Dr. Darrick Penna  . Anterior wall myocardial infarction (HCC) 10/08/2015   Presented with severe chest pain and dynamic anterior ST elevations/biphasic elevations area did not meet full criteria for STEMI, and troponin was not dramatically elevated. 95% LAD treated with PTCA followed by CABG  . Anxiety   . Atrial fibrillation, new onset (HCC) 10/2015   after CABG, in SR at d/c  . CAD (coronary artery disease), native coronary artery 10/08/2015   Severe coronary disease with calcified left main and LAD: 5% left main going into ostial LAD. Mid LAD 95% (treated with PTCA).  Proximal RCA 50%, calcified. EF was 20-25% with apical akinesis and distal anterior hypokinesis. --> Referred for CABG  . CAP (community acquired pneumonia) 07/2013   Hattie Perch 07/09/2013  . Congestive dilated cardiomyopathy (HCC) 10/08/2015   Intra-Op TEE  showed EF of 40 and 45%. She had an echo with a "normal LV function "documented, but there was no reading M.D.  . COPD (chronic obstructive pulmonary disease) (HCC)   . Depression   . Headache    "weekly" (12/31/2016)  . Hyperlipidemia LDL goal <70 10/08/2015  . IBS (irritable bowel syndrome)   . Migraine    "none in years" (12/31/2016)  . Oral thrush   . Post PTCA 10/08/2015   Presented with STEMI, severe mid LAD lesion treated with PTCA and then referred for CABG.  . TIA (transient ischemic attack)     Past Surgical History:  Procedure Laterality Date  . ABDOMINAL HYSTERECTOMY    . BACK SURGERY    . CARDIAC CATHETERIZATION N/A 10/06/2015   Procedure: Left Heart Cath and Coronary Angiography;  Surgeon: Marykay Lex, MD;  Location: [facility] INVASIVE CV LAB;  Service: Cardiovascular;: Heavily calcified left main-LAD. 75% LM & Ost LAD, 95% d-mLAD, p-mRCA Calcified 50%. EF ~20-25%  . CARDIAC CATHETERIZATION N/A 10/06/2015   Procedure: Coronary Balloon Angioplasty;  Surgeon: Marykay Lex, MD;  Location: [facility] INVASIVE CV LAB;  Service: Cardiovascular: PTCA of 95% LAD in setting of STEMI - reduced to 70% --> would need Atherectomy-PCI vs. CABG.  Sent for CABG.  . COLECTOMY    . CORONARY ARTERY BYPASS GRAFT N/A 10/16/2015   Procedure: CORONARY ARTERY BYPASS GRAFTING (CABG)  x three, using left internal mammary artery and right leg greater saphenous vein harvested endoscopically;  Surgeon: Loreli Slot, MD;  Location[facility] OR;  Service: Open Heart Surgery;  Laterality: N/A;  . FRACTURE SURGERY    . HIP PINNING,CANNULATED Left 09/24/2016   Procedure: CANNULATED HIP PINNING LEFT HIP;  Surgeon: Durene Romans, MD;  Location: [facility] ORS;  Service: Orthopedics;  Laterality: Left;  . LUMBAR DISC SURGERY     L3,4,5; S1"  . NECK SURGERY    . TEE WITHOUT CARDIOVERSION N/A 10/16/2015   Procedure: TRANSESOPHAGEAL ECHOCARDIOGRAM (TEE);  Surgeon: Loreli Slot, MD;  Location: [facility] OR;  Service: Open Heart  Surgery - IntraOp:  EF 40-45%.  . TONSILLECTOMY    . TOTAL HIP ARTHROPLASTY Right      reports that she quit smoking about 21 months ago. Her smoking use included Cigarettes. She has a 25.00 pack-year smoking history. She has never used smokeless tobacco. She reports that she drinks alcohol. She reports that she does not use drugs.  No Known Allergies  Family History  Problem Relation Age of Onset  . Stroke Mother        dead  . Clotting disorder Father        dead  bone marrow disease     Prior to Admission medications   Medication Sig Start Date End Date Taking? Authorizing Provider  aspirin EC 81 MG tablet Take 81 mg by mouth daily.   Yes [provider]  atorvastatin (LIPITOR) 40 MG tablet Take 1 tablet (40 mg total) by mouth daily at 6 PM. Patient taking differently: Take 40 mg by mouth daily.  12/18/15  Yes Barrett, Erin R, PA-C  fexofenadine (ALLEGRA) 180 MG tablet Take 180 mg by mouth every evening.    Yes [provider]  fluticasone (FLONASE) 50 MCG/ACT nasal spray Place 1  spray into both nostrils daily. 11/16/16  Yes Vassie Loll, MD  megestrol (MEGACE) 40 MG/ML suspension Take 800 mg by mouth daily as needed for other. appetite 11/29/16  Yes [provider]  methocarbamol (ROBAXIN) 500 MG tablet Take 1 tablet (500 mg total) by mouth every 8 (eight) hours as needed for muscle spasms. 01/01/17  Yes Maxie Barb, MD  metoprolol tartrate (LOPRESSOR) 25 MG tablet Take 1 tablet (25 mg total) by mouth 2 (two) times daily. 01/01/17  Yes Maxie Barb, MD  Multiple Vitamins-Minerals (MULTIVITAMIN ADULTS PO) Take 1 tablet by mouth daily.    Yes [provider]  OLANZapine (ZYPREXA) 5 MG tablet Take 5 mg by mouth every evening.  04/26/16  Yes [provider]  polyethylene glycol (MIRALAX / GLYCOLAX) packet Take 17 g by mouth daily as needed. Patient taking differently: Take 17 g by mouth daily as needed for mild constipation.   01/01/17  Yes Maxie Barb, MD  sennosides-docusate sodium (SENOKOT-S) 8.6-50 MG tablet Take 2 tablets by mouth at bedtime. Hold for loose stools   Yes [provider]  sertraline (ZOLOFT) 50 MG tablet Take 0.5 tablets (25 mg total) by mouth every morning. 11/16/16  Yes Vassie Loll, MD  traMADol (ULTRAM) 50 MG tablet Take 1 tablet (50 mg total) by mouth every 6 (six) hours as needed. Patient taking differently: Take 50 mg by mouth every 6 (six) hours as needed for moderate pain.  01/01/17  Yes Maxie Barb, MD  docusate sodium (COLACE) 100 MG capsule Take 1 capsule (100 mg total) by mouth 2 (two) times daily. Patient not taking: Reported on 01/29/2017 09/24/16   Lanney Gins, PA-C  feeding supplement, ENSURE ENLIVE, (ENSURE ENLIVE) LIQD Take 237 mLs by mouth 2 (two) times daily between meals. Patient not taking: Reported on 01/29/2017 09/26/16   Merlene Laughter, DO    Physical Exam: Vitals:   01/29/17 2117 01/29/17 2118 01/29/17 2119 01/29/17 2120  BP:      Pulse: (!) 136 (!) 141 (!) 140 (!) 140  Resp: (!) 33 (!) 46 (!) 25 (!) 38  Temp:      TempSrc:      SpO2: 95% 96% 97% 100%  Weight:      Height:          Constitutional: NAD, calm, frail, appears uncomfortable  Eyes: PERTLA, lids and conjunctivae normal ENMT: Mucous membranes are moist. Posterior pharynx clear of any exudate or lesions.   Neck: normal, supple, no masses, no thyromegaly Respiratory: slightly diminished bilatearally, no wheezing, no crackles. Normal respiratory effort.   Cardiovascular: Rate ~110 and regular. No extremity edema. No significant JVD. Abdomen: No distension, soft. Suprapubic tenderness without rebound pain or guarding. Bowel sounds active.  Musculoskeletal: no clubbing / cyanosis. No joint deformity upper and lower extremities.   Skin: no significant rashes, lesions, ulcers. Warm, dry, well-perfused. Neurologic: No gross facial asymmetry, PERRL. Sensation intact.  Patellar DTR's normal, moving all extremities spontaneuously.  Psychiatric: Alert and oriented x 3. Calm, cooperative.     Labs on Admission: I have personally reviewed following labs and imaging studies  CBC:  Recent Labs Lab 01/29/17 1343  WBC 11.4*  HGB 11.8*  HCT 36.3  MCV 92.8  PLT 265   Basic Metabolic Panel:  Recent Labs Lab 01/29/17 1343  NA 131*  K 4.3  CL 98*  CO2 23  GLUCOSE 120*  BUN 9  CREATININE 1.25*  CALCIUM 9.2   GFR: Estimated Creatinine Clearance:  25.7 mL/min (A) (by C-G formula based on SCr of 1.25 mg/dL (H)). Liver Function Tests:  Recent Labs Lab 01/29/17 1341  AST 31  ALT 18  ALKPHOS 107  BILITOT 0.7  PROT 7.3  ALBUMIN 3.3*    Recent Labs Lab 01/29/17 1341  LIPASE 30   No results for input(s): AMMONIA in the last 168 hours. Coagulation Profile: No results for input(s): INR, PROTIME in the last 168 hours. Cardiac Enzymes: No results for input(s): CKTOTAL, CKMB, CKMBINDEX, TROPONINI in the last 168 hours. BNP (last 3 results) No results for input(s): PROBNP in the last 8760 hours. HbA1C: No results for input(s): HGBA1C in the last 72 hours. CBG:  Recent Labs Lab 01/29/17 1403  GLUCAP 108*   Lipid Profile: No results for input(s): CHOL, HDL, LDLCALC, TRIG, CHOLHDL, LDLDIRECT in the last 72 hours. Thyroid Function Tests: No results for input(s): TSH, T4TOTAL, FREET4, T3FREE, THYROIDAB in the last 72 hours. Anemia Panel: No results for input(s): VITAMINB12, FOLATE, FERRITIN, TIBC, IRON, RETICCTPCT in the last 72 hours. Urine analysis:    Component Value Date/Time   COLORURINE YELLOW 01/29/2017 2129   APPEARANCEUR HAZY (A) 01/29/2017 2129   LABSPEC 1.016 01/29/2017 2129   PHURINE 7.0 01/29/2017 2129   GLUCOSEU NEGATIVE 01/29/2017 2129   HGBUR NEGATIVE 01/29/2017 2129   BILIRUBINUR NEGATIVE 01/29/2017 2129   KETONESUR NEGATIVE 01/29/2017 2129   PROTEINUR 30 (A) 01/29/2017 2129   UROBILINOGEN 0.2 02/25/2015 1826    NITRITE NEGATIVE 01/29/2017 2129   LEUKOCYTESUR MODERATE (A) 01/29/2017 2129   Sepsis Labs: (procalcitonin:4,lacticidven:4) )No results found for this or any previous visit (from the past 240 hour(s)).   Radiological Exams on Admission: Ct L-spine No Charge  Result Date: 01/29/2017 CLINICAL DATA:  82 year old female with back pain and syncope. Known abdominal aortic aneurysm. T9 compression fracture in August after a fall. EXAM: CT LUMBAR SPINE WITHOUT CONTRAST TECHNIQUE: Multidetector CT imaging of the lumbar spine was performed without intravenous contrast administration. Multiplanar CT image reconstructions were also generated. COMPARISON:  CTA Abdomen and Pelvis today reported separately. FINDINGS: Segmentation: Normal. The same numbering system was used on 12/29/2016 CT chest abdomen and pelvis. Alignment: Stable and largely normal lumbar vertebral height and alignment. Trace anterolisthesis of L5 on S1 is stable. Vertebrae: Osteopenia. No acute osseous abnormality identified. Visible sacrum and SI joints appear intact. Paraspinal and other soft tissues: Partially visible large 6 cm diameter infrarenal abdominal aortic aneurysm. See CT Abdomen and Pelvis today reported separately. No acute findings in the visualized posterior paraspinal soft tissues. Disc levels: T12-L1:  Negative. L1-L2:  Negative. L2-L3: Mild disc bulging with bilateral broad-based foraminal component. Mild epidural lipomatosis and facet hypertrophy. Mild if any spinal stenosis. No convincing foraminal stenosis. L3-L4: Mild circumferential disc bulge. Mild facet hypertrophy. No stenosis. L4-L5: Circumferential disc bulge eccentric to the right with broad-based posterior component. Prior laminectomy. Mild to moderate residual facet hypertrophy. Mild to moderate left and mild right L4 neural foraminal stenosis. L5-S1: Trace anterolisthesis. Circumferential disc bulge with broad-based posterior component. Prior laminectomy  with severe residual facet hypertrophy. Right side vacuum facet. No spinal stenosis. Borderline to mild bilateral lateral recess and foraminal stenosis. IMPRESSION: 1. Partially visible large 6 cm infrarenal abdominal aortic aneurysm with prominent growth over the past 12 months suggesting an increased risk of aneurysm rupture. Recommend Vascular Surgery referral/consultation if not already obtained. 2. See also CTA Abdomen and Pelvis today reported separately. 3.  No acute osseous abnormality in the lumbar spine. 4. Mild if any  degenerative lumbar spinal stenosis. Prior laminectomies at L4-L5 and L5-S1 with moderate to severe residual facet hypertrophy resulting in primarily mild bilateral foraminal stenosis. Electronically Signed   By: Odessa Fleming M.D.   On: 01/29/2017 16:59   Dg Chest Port 1 View  Result Date: 01/29/2017 CLINICAL DATA:  Chest pain and acute onset shortness of breath for 1 week. EXAM: PORTABLE CHEST 1 VIEW COMPARISON:  CT chest December 29, 2016 and chest radiograph November 12, 2016 FINDINGS: Cardiac silhouette is mildly enlarged and unchanged. Status post median sternotomy for CABG. Calcified aortic knob. Mild chronic interstitial changes increased lung volumes without pleural effusion. Bibasilar strandy densities. No pleural effusion or focal consolidation. Biapical pleural thickening. No pneumothorax. Soft tissue planes and included osseous structures are nonacute. Osteopenia. IMPRESSION: Stable cardiomegaly.  COPD and bibasilar fibrosis. Electronically Signed   By: Awilda Metro M.D.   On: 01/29/2017 21:47   Ct Angio Abd/pel W And/or Wo Contrast  Result Date: 01/29/2017 CLINICAL DATA:  Abdominal aortic aneurysm fall.  Syncopal episodes. EXAM: CTA ABDOMEN AND PELVIS WITHOUT AND WITH CONTRAST TECHNIQUE: Multidetector CT imaging of the abdomen and pelvis was performed using the standard protocol during bolus administration of intravenous contrast. Multiplanar reconstructed images and MIPs were  obtained and reviewed to evaluate the vascular anatomy. CONTRAST:  80 cc Isovue 370 IV COMPARISON:  12/29/2016 CT FINDINGS: VASCULAR Aorta: Infrarenal abdominal aortic aneurysm is again noted, stable in appearance, estimated at 5.9 x 5.4 cm without periaortic stranding to suggest rupture. Measurements are similar to most recent prior exam though reported as being 6 x 5.9 cm but should have been 6 x 5.49 cm. Extensive atherosclerotic atherosclerosis with mural thrombus is again seen. No acute vascular injury, retroperitoneal fluid or hemorrhage. Celiac: Patent with moderate calcific atherosclerosis at its origin with a 50% stenosis suggested. No evidence of dissection, vasculitis or significant stenosis. SMA: Patent with mild-to-moderate atherosclerosis at the origin with less than 50% stenosis suggested. No evidence of aneurysm, dissection, vasculitis or significant stenosis. Renals: Single renal arteries to both kidneys with atherosclerosis along the proximal right renal artery and origin of the left with at least 50% stenosis suggested on the right and also likely in the left. No dissection, or evidence of fibromuscular dysplasia. No aneurysm. Perfusion is symmetric both kidneys however. IMA: Not definitively identified and is likely occluded. Inflow: Moderate atherosclerosis of the common iliac arteries without aneurysm, dissection or significant stenosis. Thin attenuated but patent internal iliac arteries with small external iliac arteries. Proximal Outflow: Patent without evidence of aneurysm, dissection, vasculitis or significant stenosis. Veins: No obvious venous abnormality within the limitations of this arterial phase study. Review of the MIP images confirms the above findings. NON-VASCULAR Lower chest: Extensive bilateral centrilobular and paraseptal emphysema with subpleural areas of fibrosis. Stable right lower lobe 8 mm opacity may reflect an area of chronic scarring, unchanged since recent comparison  from 12/29/2016 although new since 10/23/2015. Hepatobiliary: No focal liver abnormality is seen. No gallstones, gallbladder wall thickening, or biliary dilatation. Pancreas: Unremarkable. No pancreatic ductal dilatation or surrounding inflammatory changes. Spleen: Normal in size without focal abnormality. Adrenals/Urinary Tract: Bilateral renal cortical thinning more pronounced on the left. No obstructive uropathy or nephrolithiasis. Tiny exophytic hyperdense lesion in the upper pole left kidney may represent a hemorrhagic or proteinaceous cyst. This measures 4 mm and is too small to further characterize. Stomach/Bowel: Contracted stomach with normal small bowel rotation. No acute bowel inflammation or obstruction. Evidence of prior partial colectomy with anastomotic sutures along  the expected location of the rectosigmoid. Small bowel anastomotic sutures are also seen in the mid pelvis. Appendix is not definitively identified but there is no evidence of right lower quadrant or pericecal inflammation. Lymphatic: No lymphadenopathy. Reproductive: Status post hysterectomy. No adnexal masses. Other: No abdominal wall hernia or abnormality. No abdominopelvic ascites. Musculoskeletal: No acute or significant osseous findings. IMPRESSION: VASCULAR 1. Re- demonstration of a stable infrarenal fusiform abdominal aortic aneurysm unchanged since CT from 12/29/2016. It is currently estimated at 5.9 x 5.4 cm. No evidence rupture or leak. 2. 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 NON-VASCULAR Centrilobular and paraseptal emphysema of the lung bases with interstitial fibrosis. Probable scarring at the right lung base accounting for an 8 mm opacity seen. Electronically Signed   By: Tollie Eth M.D.   On: 01/29/2017 19:02    EKG: Independently reviewed. Sinus tachycardia (rate 127),  PVC's.   Assessment/Plan  1. Syncope  - Pt presents following a syncopal episode at her SNF  - Reports multiple such episodes over past 2 days, states that it has been upon standing  - She is too symptomatic in ED for orthostatic vitals  - With recent N/V/D and apparent hypovolemia, suspect this is orthostatic  - Plan to continue cardiac monitoring, continue IVF hydration, and check echocardiogram    2. Nausea, vomiting, diarrhea  - Pt reports N/V/D for past 2 days, does not know of anyone else at the SNF with similar sxs  - Denies blood in vomitus or stool, and denies abd pain   - Recently treated with Rocephin for UTI  - Continue IVF hydration, prn antiemetics, send stool for GI panel and C diff if diarrhea recurs   3. Hypertension, labile BP  - BP initially wnl in ED, became very high in setting of active vomiting, was treated with 10 mg IV labetalol, and then dropped SBP to mid-70's  - Plan to continue IVF hydration, continue Lopressor with holding parameters    4. CAD   - No anginal complaints  - Troponin undetectable x2 in ED several hrs apart  - Continue cardiac monitoring, continue statin, ASA, and beta-blocker    5. Hyponatremia  - Serum sodium is 131 on admission in the setting of hypovolemia  - She was treated in the ED with 1 liter NS and will be continued on NS infusion  - Repeat chem panel in am   6. Recurrent UTI  - UA is suggestive of infection and suprapubic tenderness noted - Prior cultures have grown multiple species  - She has been hospitalized recently and treated with Rocephin at that time for UTI, now residing in SNF, will treat empirically with cefepime pending culture data and clinical course   7. CKD stage III  - SCr is 1.25 on admission, up from 1-1.2 last month  - She appears hypovolemic on admission, had been given a liter of NS in ED, and will be continued on NS infusion   - Repeat chem panel in am   8. AAA - Stable from scan 1 mo ago, followed  by vascular surgery and asymptomatic  - Continue outpt vascular surgery follow-up    DVT prophylaxis: sq heparin  Code Status: DNR Family Communication: Discussed with patient Disposition Plan: Observe on telemetry Consults called: None Admission status: Observation    Briscoe Deutscher, MD [facility] Pager [phone]  If 7PM-7AM, please contact night-coverage [email or web address] Password [facility]  01/29/2017, 11:28 PM

## 2017-01-30 ENCOUNTER — Observation Stay (HOSPITAL_BASED_OUTPATIENT_CLINIC_OR_DEPARTMENT_OTHER): Payer: Medicare Other

## 2017-01-30 ENCOUNTER — Other Ambulatory Visit (HOSPITAL_COMMUNITY): Payer: Medicare Other

## 2017-01-30 DIAGNOSIS — N3000 Acute cystitis without hematuria: Secondary | ICD-10-CM

## 2017-01-30 DIAGNOSIS — K5900 Constipation, unspecified: Secondary | ICD-10-CM | POA: Diagnosis not present

## 2017-01-30 DIAGNOSIS — J449 Chronic obstructive pulmonary disease, unspecified: Secondary | ICD-10-CM | POA: Diagnosis present

## 2017-01-30 DIAGNOSIS — R109 Unspecified abdominal pain: Secondary | ICD-10-CM | POA: Diagnosis not present

## 2017-01-30 DIAGNOSIS — R112 Nausea with vomiting, unspecified: Secondary | ICD-10-CM

## 2017-01-30 DIAGNOSIS — R55 Syncope and collapse: Secondary | ICD-10-CM | POA: Diagnosis present

## 2017-01-30 DIAGNOSIS — R000 Tachycardia, unspecified: Secondary | ICD-10-CM | POA: Diagnosis present

## 2017-01-30 DIAGNOSIS — I5030 Unspecified diastolic (congestive) heart failure: Secondary | ICD-10-CM | POA: Diagnosis not present

## 2017-01-30 DIAGNOSIS — N183 Chronic kidney disease, stage 3 (moderate): Secondary | ICD-10-CM | POA: Diagnosis present

## 2017-01-30 DIAGNOSIS — I714 Abdominal aortic aneurysm, without rupture: Secondary | ICD-10-CM | POA: Diagnosis present

## 2017-01-30 DIAGNOSIS — I160 Hypertensive urgency: Secondary | ICD-10-CM

## 2017-01-30 DIAGNOSIS — I10 Essential (primary) hypertension: Secondary | ICD-10-CM

## 2017-01-30 DIAGNOSIS — K580 Irritable bowel syndrome with diarrhea: Secondary | ICD-10-CM | POA: Diagnosis present

## 2017-01-30 DIAGNOSIS — E860 Dehydration: Secondary | ICD-10-CM | POA: Diagnosis present

## 2017-01-30 DIAGNOSIS — E785 Hyperlipidemia, unspecified: Secondary | ICD-10-CM | POA: Diagnosis present

## 2017-01-30 DIAGNOSIS — F419 Anxiety disorder, unspecified: Secondary | ICD-10-CM | POA: Diagnosis present

## 2017-01-30 DIAGNOSIS — R42 Dizziness and giddiness: Secondary | ICD-10-CM

## 2017-01-30 DIAGNOSIS — Z66 Do not resuscitate: Secondary | ICD-10-CM | POA: Diagnosis present

## 2017-01-30 DIAGNOSIS — M549 Dorsalgia, unspecified: Secondary | ICD-10-CM

## 2017-01-30 DIAGNOSIS — N390 Urinary tract infection, site not specified: Secondary | ICD-10-CM | POA: Diagnosis present

## 2017-01-30 DIAGNOSIS — I2510 Atherosclerotic heart disease of native coronary artery without angina pectoris: Secondary | ICD-10-CM | POA: Diagnosis present

## 2017-01-30 DIAGNOSIS — B9620 Unspecified Escherichia coli [E. coli] as the cause of diseases classified elsewhere: Secondary | ICD-10-CM | POA: Diagnosis present

## 2017-01-30 DIAGNOSIS — I420 Dilated cardiomyopathy: Secondary | ICD-10-CM | POA: Diagnosis present

## 2017-01-30 DIAGNOSIS — E871 Hypo-osmolality and hyponatremia: Secondary | ICD-10-CM

## 2017-01-30 DIAGNOSIS — D649 Anemia, unspecified: Secondary | ICD-10-CM | POA: Diagnosis present

## 2017-01-30 DIAGNOSIS — R197 Diarrhea, unspecified: Secondary | ICD-10-CM

## 2017-01-30 DIAGNOSIS — I4891 Unspecified atrial fibrillation: Secondary | ICD-10-CM | POA: Diagnosis present

## 2017-01-30 DIAGNOSIS — J439 Emphysema, unspecified: Secondary | ICD-10-CM | POA: Diagnosis not present

## 2017-01-30 DIAGNOSIS — I493 Ventricular premature depolarization: Secondary | ICD-10-CM | POA: Diagnosis present

## 2017-01-30 DIAGNOSIS — E861 Hypovolemia: Secondary | ICD-10-CM | POA: Diagnosis present

## 2017-01-30 DIAGNOSIS — I129 Hypertensive chronic kidney disease with stage 1 through stage 4 chronic kidney disease, or unspecified chronic kidney disease: Secondary | ICD-10-CM | POA: Diagnosis present

## 2017-01-30 DIAGNOSIS — I951 Orthostatic hypotension: Secondary | ICD-10-CM | POA: Diagnosis present

## 2017-01-30 LAB — CBC WITH DIFFERENTIAL/PLATELET
BASOS ABS: 0 10*3/uL (ref 0.0–0.1)
Basophils Relative: 0 %
EOS PCT: 0 %
Eosinophils Absolute: 0 10*3/uL (ref 0.0–0.7)
HEMATOCRIT: 32.1 % — AB (ref 36.0–46.0)
Hemoglobin: 10.3 g/dL — ABNORMAL LOW (ref 12.0–15.0)
LYMPHS PCT: 7 %
Lymphs Abs: 1 10*3/uL (ref 0.7–4.0)
MCH: 29.8 pg (ref 26.0–34.0)
MCHC: 32.1 g/dL (ref 30.0–36.0)
MCV: 92.8 fL (ref 78.0–100.0)
MONO ABS: 1 10*3/uL (ref 0.1–1.0)
MONOS PCT: 7 %
NEUTROS ABS: 13 10*3/uL — AB (ref 1.7–7.7)
Neutrophils Relative %: 86 %
PLATELETS: 253 10*3/uL (ref 150–400)
RBC: 3.46 MIL/uL — ABNORMAL LOW (ref 3.87–5.11)
RDW: 12.8 % (ref 11.5–15.5)
WBC: 15 10*3/uL — ABNORMAL HIGH (ref 4.0–10.5)

## 2017-01-30 LAB — ECHOCARDIOGRAM COMPLETE
HEIGHTINCHES: 60 in
Weight: 1777.6 oz

## 2017-01-30 LAB — BASIC METABOLIC PANEL
ANION GAP: 9 (ref 5–15)
BUN: 11 mg/dL (ref 6–20)
CALCIUM: 8.4 mg/dL — AB (ref 8.9–10.3)
CO2: 20 mmol/L — AB (ref 22–32)
Chloride: 104 mmol/L (ref 101–111)
Creatinine, Ser: 1.22 mg/dL — ABNORMAL HIGH (ref 0.44–1.00)
GFR calc Af Amer: 47 mL/min — ABNORMAL LOW (ref >60)
GFR, EST NON AFRICAN AMERICAN: 41 mL/min — AB (ref >60)
GLUCOSE: 156 mg/dL — AB (ref 65–99)
POTASSIUM: 3.9 mmol/L (ref 3.5–5.1)
Sodium: 133 mmol/L — ABNORMAL LOW (ref 135–145)

## 2017-01-30 LAB — MRSA PCR SCREENING: MRSA by PCR: NEGATIVE

## 2017-01-30 LAB — GLUCOSE, CAPILLARY: Glucose-Capillary: 113 mg/dL — ABNORMAL HIGH (ref 65–99)

## 2017-01-30 MED ORDER — SODIUM CHLORIDE 0.9 % IV SOLN
INTRAVENOUS | Status: AC
  Administered 2017-01-30: 22:00:00 via INTRAVENOUS

## 2017-01-30 NOTE — Progress Notes (Signed)
PROGRESS NOTE  Tonya Payne  ZOX:[id number] DOB: 1934-09-02 DOA: 01/29/2017 PCP: Rocky Morel, MD   Brief Narrative:  Tonya Payne is a 82 y.o. female with medical history significant for CAD status post CABG, AAA, chronic kidney disease stage III, and admission last month for fall with acute thoracic compression fracture, discharged to SNF for rehabilitation, and now presenting after a syncopal episode. Patient reports that she had initially been doing well at the rehabilitation facility, but reports developing nausea with vomiting and non-bloody diarrhea over the past 2 days. She reports lightheadedness upon standing associated with this, reports that she has suffered a loss of consciousness multiple times over the last 2 days, typically upon standing. This afternoon, she was reportedly exercising on a recumbent bike at the rehabilitation facility, stood up, and suffered a brief loss of consciousness without hitting her head. EMS was called for transport to the hospital. She denies fevers or chills, denies chest pain, denies shortness of breath or cough, and denies abdominal pain. She does not know of any other residents at her facility with similar GI illness. Prior to the last 2 days, she does not recall ever having a syncopal episode before.   Upon arrival to the ED, patient was found to be afebrile, saturating well on room air, and with vital signs otherwise stable. Patient later developed acute vomiting with jump in her heart rate to 130s and marked elevation in blood pressure. CTA of the chest abdomen and pelvis is obtained and negative for acute pathology, but notable for stable infrarenal fusiform AAA without appreciable change since the CT performed one month ago. Patient was treated with empiric Rocephin for suspected UTI, given a dose of 10 mg labetalol, had systolic blood pressure drop into the 70s, and was treated with a liter of normal saline. Abx were changed to Cefepime and patient was  admitted for Syncope likley from N/V/D and Suspected recurrent UTI.   Assessment & Plan:   Principal Problem:   Syncope Active Problems:   UTI (urinary tract infection)   AAA (abdominal aortic aneurysm) (HCC)   Hyponatremia   Hypertension   CKD (chronic kidney disease), stage III   COPD (chronic obstructive pulmonary disease) (HCC)   CAD (coronary artery disease)   Nausea vomiting and diarrhea   Hypertensive urgency  1. Syncope likely from Dehydration - Pt presents following a syncopal episode at her SNF  - Reports multiple such episodes over past 2 days, states that it has been upon standing  - She is too symptomatic in ED for orthostatic vitals  - With recent N/V/D and apparent hypovolemia, suspect this is orthostatic  - Plan to continue cardiac monitoring, continue IVF hydration - ECHOCARDIOGRAM Done and showed Normal LV size and systolic function, EF 60-65%. Normal RV size and systolic function. No significant valvular abnormalities - Strict I's and O's  2. Nausea, Vomiting, Diarrhea  - Pt reports N/V/D for past 2 days, does not know of anyone else at the SNF with similar sxs  - Denies blood in vomitus or stool, and denies abd pain   - Recently treated with Rocephin for UTI  - Continue IVF hydration, prn antiemetics with Zofran 4 mg po/IV, send stool for GI panel and C diff if diarrhea recurs  - No recurrent Diarrhea today   3. Hypertension, labile BP  - BP initially wnl in ED, became very high in setting of active vomiting, was treated with 10 mg IV labetalol, and then dropped SBP to mid-70's  -  Plan to continue IVF hydration, continue Metoprolol 25 mg po BID with holding parameters    4. CAD   - No anginal complaints  - Troponin undetectable x2 in ED several hrs apart  - Continue cardiac monitoring, continue statin, ASA, and beta-blocker   - C/w Telemetry Monitoring   5. Hyponatremia  - Serum sodium is 131 on admission in the setting of hypovolemia  - She was  treated in the ED with 1 liter NS and will be continued on NS infusion  - Sodium improved to 133 - Repeat chem panel in am   6. Recurrent UTI  - UA is suggestive of infection and suprapubic tenderness noted - WBC went from 11.4 -> 15.0 - Prior cultures have grown multiple species  - She has been hospitalized recently and treated with Rocephin at that time for UTI, now residing in SNF, will treat empirically with cefepime pending culture data and clinical course  - Obtain Blood Cx - Urine was purulent when Foley was inserted and patient has Flank Tenderness - Obtain Kidney Ultrasound   7. CKD stage III  - SCr is 1.25 on admission, up from 1-1.2 last month  - She appears hypovolemic on admission, had been given a liter of NS in ED, and will be continued on NS infusion  at 125 mL/hr for 8 hours and then 75 mL/hr - Repeat BUN/Cr was 11/1.22 - Repeat Chem panel in am   8. AAA - Stable from scan 1 month ago, followed by vascular surgery and asymptomatic  - Continue outpt vascular surgery follow-up   DVT prophylaxis: Heparin 5,000 units sq q8h Code Status: DO NOT RESUSCITATE Family Communication: None Disposition Plan: Remain Inpatient and Have PT Evaluation  Consultants:   None   Procedures: ECHOCARDIOGRAM ------------------------------------------------------------------- Study Conclusions  - Left ventricle: The cavity size was normal. Wall thickness was   normal. Systolic function was normal. The estimated ejection   fraction was in the range of 60% to 65%. Wall motion was normal;   there were no regional wall motion abnormalities. Doppler   parameters are consistent with abnormal left ventricular   relaxation (grade 1 diastolic dysfunction). - Aortic valve: There was no stenosis. - Mitral valve: There was trivial regurgitation. - Right ventricle: The cavity size was normal. Systolic function   was normal. - Pulmonary arteries: No complete TR doppler jet so unable to    estimate PA systolic pressure. - Inferior vena cava: The vessel was normal in size. The   respirophasic diameter changes were in the normal range (>= 50%),   consistent with normal central venous pressure.  Impressions:  - Normal LV size and systolic function, EF 60-65%. Normal RV size   and systolic function. No significant valvular abnormalities.   Antimicrobials:  Anti-infectives    Start     Dose/Rate Route Frequency Ordered Stop   01/29/17 2345  ceFEPIme (MAXIPIME) 1 g in dextrose 5 % 50 mL IVPB     1 g 100 mL/hr over 30 Minutes Intravenous Every 24 hours 01/29/17 2334     01/29/17 2230  cefTRIAXone (ROCEPHIN) 1 g in dextrose 5 % 50 mL IVPB     1 g 100 mL/hr over 30 Minutes Intravenous  Once 01/29/17 2228 01/29/17 2303     Subjective: Seen and examined and states she was having lower abdominal cramping pain in the left quadrant as well as flank pain. No CP or SOB. Denied any other concerns or complaints.   Objective: Vitals:  01/30/17 0030 01/30/17 0131 01/30/17 0943 01/30/17 1345  BP: (!) 85/69 104/68 (!) 121/58 111/72  Pulse: 99 97 82 84  Resp: 17 (!) 22  18  Temp:  98.9 F (37.2 C)  98 F (36.7 C)  TempSrc:  Axillary    SpO2: 98% 95%  98%  Weight:  50.4 kg (111 lb 1.6 oz)    Height:        Intake/Output Summary (Last 24 hours) at 01/30/17 2110 Last data filed at 01/30/17 1500  Gross per 24 hour  Intake          1464.17 ml  Output                0 ml  Net          1464.17 ml   Filed Weights   01/29/17 1340 01/30/17 0131  Weight: 45.4 kg (100 lb) 50.4 kg (111 lb 1.6 oz)   Examination: Physical Exam:  Constitutional: Thin Caucasian female in NAD and appears calm and comfortable Eyes:  Lids and conjunctivae normal, sclerae anicteric  ENMT: External Ears, Nose appear normal. Grossly normal hearing. Mucous membranes are dry.  Neck: Appears normal, supple, no cervical masses, normal ROM, no appreciable thyromegaly, no JVD Respiratory: Diminished to  auscultation bilaterally, no wheezing, rales, rhonchi or crackles. Normal respiratory effort and patient is not tachypenic. No accessory muscle use.  Cardiovascular: RRR, no murmurs / rubs / gallops. S1 and S2 auscultated. No extremity edema.  Abdomen: Soft, Tender in Left Lower Quadrant, non-distended. No masses palpated. No appreciable hepatosplenomegaly. Bowel sounds positive.  GU: Deferred. Musculoskeletal: No clubbing / cyanosis of digits/nails. No joint deformity upper and lower extremities. Good ROM, no contractures.   Skin: No rashes, lesions, ulcers on a limited skin eval. No induration; Warm and dry.  Neurologic: CN 2-12 grossly intact with no focal deficits. Romberg sign cerebellar reflexes not assessed.  Psychiatric: Normal judgment and insight. Alert and oriented x 3. Normal mood and appropriate affect.   Data Reviewed: I have personally reviewed following labs and imaging studies  CBC:  Recent Labs Lab 01/29/17 1343 01/30/17 0421  WBC 11.4* 15.0*  NEUTROABS  --  13.0*  HGB 11.8* 10.3*  HCT 36.3 32.1*  MCV 92.8 92.8  PLT 265 253   Basic Metabolic Panel:  Recent Labs Lab 01/29/17 1343 01/30/17 0421  NA 131* 133*  K 4.3 3.9  CL 98* 104  CO2 23 20*  GLUCOSE 120* 156*  BUN 9 11  CREATININE 1.25* 1.22*  CALCIUM 9.2 8.4*   GFR: Estimated Creatinine Clearance: 26.4 mL/min (A) (by C-G formula based on SCr of 1.22 mg/dL (H)). Liver Function Tests:  Recent Labs Lab 01/29/17 1341  AST 31  ALT 18  ALKPHOS 107  BILITOT 0.7  PROT 7.3  ALBUMIN 3.3*    Recent Labs Lab 01/29/17 1341  LIPASE 30   No results for input(s): AMMONIA in the last 168 hours. Coagulation Profile: No results for input(s): INR, PROTIME in the last 168 hours. Cardiac Enzymes: No results for input(s): CKTOTAL, CKMB, CKMBINDEX, TROPONINI in the last 168 hours. BNP (last 3 results) No results for input(s): PROBNP in the last 8760 hours. HbA1C: No results for input(s): HGBA1C in the  last 72 hours. CBG:  Recent Labs Lab 01/29/17 1403 01/30/17 0803  GLUCAP 108* 113*   Lipid Profile: No results for input(s): CHOL, HDL, LDLCALC, TRIG, CHOLHDL, LDLDIRECT in the last 72 hours. Thyroid Function Tests: No results for input(s): TSH, T4TOTAL, FREET4,  T3FREE, THYROIDAB in the last 72 hours. Anemia Panel: No results for input(s): VITAMINB12, FOLATE, FERRITIN, TIBC, IRON, RETICCTPCT in the last 72 hours. Sepsis Labs: No results for input(s): PROCALCITON, LATICACIDVEN in the last 168 hours.  Recent Results (from the past 240 hour(s))  MRSA PCR Screening     Status: None   Collection Time: 01/30/17  3:55 AM  Result Value Ref Range Status   MRSA by PCR NEGATIVE NEGATIVE Final    Comment:        The GeneXpert MRSA Assay (FDA approved for NASAL specimens only), is one component of a comprehensive MRSA colonization surveillance program. It is not intended to diagnose MRSA infection nor to guide or monitor treatment for MRSA infections.     Radiology Studies: Ct L-spine No Charge  Result Date: 01/29/2017 CLINICAL DATA:  82 year old female with back pain and syncope. Known abdominal aortic aneurysm. T9 compression fracture in August after a fall. EXAM: CT LUMBAR SPINE WITHOUT CONTRAST TECHNIQUE: Multidetector CT imaging of the lumbar spine was performed without intravenous contrast administration. Multiplanar CT image reconstructions were also generated. COMPARISON:  CTA Abdomen and Pelvis today reported separately. FINDINGS: Segmentation: Normal. The same numbering system was used on 12/29/2016 CT chest abdomen and pelvis. Alignment: Stable and largely normal lumbar vertebral height and alignment. Trace anterolisthesis of L5 on S1 is stable. Vertebrae: Osteopenia. No acute osseous abnormality identified. Visible sacrum and SI joints appear intact. Paraspinal and other soft tissues: Partially visible large 6 cm diameter infrarenal abdominal aortic aneurysm. See CT Abdomen and  Pelvis today reported separately. No acute findings in the visualized posterior paraspinal soft tissues. Disc levels: T12-L1:  Negative. L1-L2:  Negative. L2-L3: Mild disc bulging with bilateral broad-based foraminal component. Mild epidural lipomatosis and facet hypertrophy. Mild if any spinal stenosis. No convincing foraminal stenosis. L3-L4: Mild circumferential disc bulge. Mild facet hypertrophy. No stenosis. L4-L5: Circumferential disc bulge eccentric to the right with broad-based posterior component. Prior laminectomy. Mild to moderate residual facet hypertrophy. Mild to moderate left and mild right L4 neural foraminal stenosis. L5-S1: Trace anterolisthesis. Circumferential disc bulge with broad-based posterior component. Prior laminectomy with severe residual facet hypertrophy. Right side vacuum facet. No spinal stenosis. Borderline to mild bilateral lateral recess and foraminal stenosis. IMPRESSION: 1. Partially visible large 6 cm infrarenal abdominal aortic aneurysm with prominent growth over the past 12 months suggesting an increased risk of aneurysm rupture. Recommend Vascular Surgery referral/consultation if not already obtained. 2. See also CTA Abdomen and Pelvis today reported separately. 3.  No acute osseous abnormality in the lumbar spine. 4. Mild if any degenerative lumbar spinal stenosis. Prior laminectomies at L4-L5 and L5-S1 with moderate to severe residual facet hypertrophy resulting in primarily mild bilateral foraminal stenosis. Electronically Signed   By: Odessa Fleming M.D.   On: 01/29/2017 16:59   Dg Chest Port 1 View  Result Date: 01/29/2017 CLINICAL DATA:  Chest pain and acute onset shortness of breath for 1 week. EXAM: PORTABLE CHEST 1 VIEW COMPARISON:  CT chest December 29, 2016 and chest radiograph November 12, 2016 FINDINGS: Cardiac silhouette is mildly enlarged and unchanged. Status post median sternotomy for CABG. Calcified aortic knob. Mild chronic interstitial changes increased lung  volumes without pleural effusion. Bibasilar strandy densities. No pleural effusion or focal consolidation. Biapical pleural thickening. No pneumothorax. Soft tissue planes and included osseous structures are nonacute. Osteopenia. IMPRESSION: Stable cardiomegaly.  COPD and bibasilar fibrosis. Electronically Signed   By: Awilda Metro M.D.   On: 01/29/2017 21:47  Ct Angio Abd/pel W And/or Wo Contrast  Result Date: 01/29/2017 CLINICAL DATA:  Abdominal aortic aneurysm fall.  Syncopal episodes. EXAM: CTA ABDOMEN AND PELVIS WITHOUT AND WITH CONTRAST TECHNIQUE: Multidetector CT imaging of the abdomen and pelvis was performed using the standard protocol during bolus administration of intravenous contrast. Multiplanar reconstructed images and MIPs were obtained and reviewed to evaluate the vascular anatomy. CONTRAST:  80 cc Isovue 370 IV COMPARISON:  12/29/2016 CT FINDINGS: VASCULAR Aorta: Infrarenal abdominal aortic aneurysm is again noted, stable in appearance, estimated at 5.9 x 5.4 cm without periaortic stranding to suggest rupture. Measurements are similar to most recent prior exam though reported as being 6 x 5.9 cm but should have been 6 x 5.49 cm. Extensive atherosclerotic atherosclerosis with mural thrombus is again seen. No acute vascular injury, retroperitoneal fluid or hemorrhage. Celiac: Patent with moderate calcific atherosclerosis at its origin with a 50% stenosis suggested. No evidence of dissection, vasculitis or significant stenosis. SMA: Patent with mild-to-moderate atherosclerosis at the origin with less than 50% stenosis suggested. No evidence of aneurysm, dissection, vasculitis or significant stenosis. Renals: Single renal arteries to both kidneys with atherosclerosis along the proximal right renal artery and origin of the left with at least 50% stenosis suggested on the right and also likely in the left. No dissection, or evidence of fibromuscular dysplasia. No aneurysm. Perfusion is symmetric  both kidneys however. IMA: Not definitively identified and is likely occluded. Inflow: Moderate atherosclerosis of the common iliac arteries without aneurysm, dissection or significant stenosis. Thin attenuated but patent internal iliac arteries with small external iliac arteries. Proximal Outflow: Patent without evidence of aneurysm, dissection, vasculitis or significant stenosis. Veins: No obvious venous abnormality within the limitations of this arterial phase study. Review of the MIP images confirms the above findings. NON-VASCULAR Lower chest: Extensive bilateral centrilobular and paraseptal emphysema with subpleural areas of fibrosis. Stable right lower lobe 8 mm opacity may reflect an area of chronic scarring, unchanged since recent comparison from 12/29/2016 although new since 10/23/2015. Hepatobiliary: No focal liver abnormality is seen. No gallstones, gallbladder wall thickening, or biliary dilatation. Pancreas: Unremarkable. No pancreatic ductal dilatation or surrounding inflammatory changes. Spleen: Normal in size without focal abnormality. Adrenals/Urinary Tract: Bilateral renal cortical thinning more pronounced on the left. No obstructive uropathy or nephrolithiasis. Tiny exophytic hyperdense lesion in the upper pole left kidney may represent a hemorrhagic or proteinaceous cyst. This measures 4 mm and is too small to further characterize. Stomach/Bowel: Contracted stomach with normal small bowel rotation. No acute bowel inflammation or obstruction. Evidence of prior partial colectomy with anastomotic sutures along the expected location of the rectosigmoid. Small bowel anastomotic sutures are also seen in the mid pelvis. Appendix is not definitively identified but there is no evidence of right lower quadrant or pericecal inflammation. Lymphatic: No lymphadenopathy. Reproductive: Status post hysterectomy. No adnexal masses. Other: No abdominal wall hernia or abnormality. No abdominopelvic ascites.  Musculoskeletal: No acute or significant osseous findings. IMPRESSION: VASCULAR 1. Re- demonstration of a stable infrarenal fusiform abdominal aortic aneurysm unchanged since CT from 12/29/2016. It is currently estimated at 5.9 x 5.4 cm. No evidence rupture or leak. 2. 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 NON-VASCULAR Centrilobular and paraseptal emphysema of the lung bases with interstitial fibrosis. Probable scarring at the right lung base accounting for an 8 mm opacity seen. Electronically Signed   By: Onalee Hua  Sterling Big M.D.   On: 01/29/2017 19:02   Scheduled Meds: . aspirin EC  81 mg Oral Daily  . atorvastatin  40 mg Oral q1800  . fluticasone  1 spray Each Nare Daily  . heparin  5,000 Units Subcutaneous Q8H  . loratadine  10 mg Oral Daily  . metoprolol tartrate  25 mg Oral BID  . OLANZapine  5 mg Oral QHS  . sertraline  25 mg Oral q morning - 10a  . sodium chloride flush  3 mL Intravenous Q12H   Continuous Infusions: . ceFEPime (MAXIPIME) IV Stopped (01/30/17 0418)    LOS: 0 days   Merlene Laughter, DO [facility] Pager [phone]  If 7PM-7AM, please contact night-coverage [email or web address] Password [email or web address] 01/30/2017, 9:10 PM

## 2017-01-30 NOTE — Progress Notes (Signed)
  Echocardiogram 2D Echocardiogram has been performed.  Tonya Payne 01/30/2017, 11:38 AM

## 2017-01-30 NOTE — Clinical Social Work Note (Signed)
Clinical Social Work Assessment  Patient Details  Name: Tonya Payne MRN: [id number] Date of Birth: 01/25/1935  Date of referral:  01/30/17               Reason for consult:  Facility Placement                Permission sought to share information with:  Oceanographer granted to share information::  Yes, Verbal Permission Granted  Name::        Agency::  [facility]  Relationship::     Contact Information:     Housing/Transportation Living arrangements for the past 2 months:  Skilled Building surveyor of Information:  Patient Patient Interpreter Needed:  None Criminal Activity/Legal Involvement Pertinent to Current Situation/Hospitalization:  No - Comment as needed Significant Relationships:  Adult Children Lives with:  Facility Resident Do you feel safe going back to the place where you live?  Yes Need for family participation in patient care:  No (Coment)  Care giving concerns:  CSW received consult regarding discharge plan. Patient reports that she came to the hospital from [facility] and would like to return at discharge. CSW to continue to follow and assist with discharge planning needs.   Social Worker assessment / plan:  CSW spoke with patient regarding discharge plan.   Employment status:  Retired Health and safety inspector:  Medicare PT Recommendations:  Not assessed at this time Information / Referral to community resources:  Skilled Nursing Facility  Patient/Family's Response to care:  Patient reports agreement with discharge plan back to [place]. She will let CSW if she needs an ambulance transport back.  Patient/Family's Understanding of and Emotional Response to Diagnosis, Current Treatment, and Prognosis:  Patient/family is realistic regarding therapy needs and expressed being hopeful for return to SNF placement. Patient expressed understanding of CSW role and discharge process as well as medical condition. No questions/concerns  about plan or treatment.    Emotional Assessment Appearance:  Appears stated age Attitude/Demeanor/Rapport:  Other (Appropriate) Affect (typically observed):  Accepting, Appropriate, Pleasant Orientation:  Oriented to Self, Oriented to Situation, Oriented to Place, Oriented to  Time Alcohol / Substance use:  Not Applicable Psych involvement (Current and /or in the community):  No (Comment)  Discharge Needs  Concerns to be addressed:  Care Coordination Readmission within the last 30 days:  Yes Current discharge risk:  None Barriers to Discharge:  Continued Medical Work up   [employer], LCSWA 01/30/2017, 3:13 PM

## 2017-01-30 NOTE — Progress Notes (Signed)
PT Cancellation Note  Patient Details Name: Tonya Payne MRN: [id number] DOB: 10-18-1934   Cancelled Treatment:    Reason Eval/Treat Not Completed: Patient declined, no reason specified Pt refusing, stating she is not getting up today and reporting she does not want to pass out. Will reattempt as schedule allows.   [name], PT, DPT  Acute Rehabilitation Services  Pager: [phone]    [name] 01/30/2017, 12:16 PM

## 2017-01-30 NOTE — Progress Notes (Signed)
Patient received from ED via bed.alert oriented x2.vital signs are stable. Skin assessment done with another nurse found bruising on left leg. Patient given instruction about call bell and phone. Bed in low position and side rail up x3.

## 2017-01-31 ENCOUNTER — Inpatient Hospital Stay (HOSPITAL_COMMUNITY): Payer: Medicare Other

## 2017-01-31 DIAGNOSIS — N390 Urinary tract infection, site not specified: Secondary | ICD-10-CM

## 2017-01-31 DIAGNOSIS — I951 Orthostatic hypotension: Secondary | ICD-10-CM

## 2017-01-31 DIAGNOSIS — B9620 Unspecified Escherichia coli [E. coli] as the cause of diseases classified elsewhere: Secondary | ICD-10-CM

## 2017-01-31 LAB — CBC WITH DIFFERENTIAL/PLATELET
BASOS PCT: 0 %
Basophils Absolute: 0 10*3/uL (ref 0.0–0.1)
EOS PCT: 3 %
Eosinophils Absolute: 0.2 10*3/uL (ref 0.0–0.7)
HEMATOCRIT: 32.3 % — AB (ref 36.0–46.0)
Hemoglobin: 10.4 g/dL — ABNORMAL LOW (ref 12.0–15.0)
Lymphocytes Relative: 24 %
Lymphs Abs: 2.1 10*3/uL (ref 0.7–4.0)
MCH: 30.1 pg (ref 26.0–34.0)
MCHC: 32.2 g/dL (ref 30.0–36.0)
MCV: 93.6 fL (ref 78.0–100.0)
MONO ABS: 1.1 10*3/uL — AB (ref 0.1–1.0)
MONOS PCT: 13 %
NEUTROS ABS: 5.3 10*3/uL (ref 1.7–7.7)
Neutrophils Relative %: 60 %
PLATELETS: 237 10*3/uL (ref 150–400)
RBC: 3.45 MIL/uL — ABNORMAL LOW (ref 3.87–5.11)
RDW: 12.9 % (ref 11.5–15.5)
WBC: 8.7 10*3/uL (ref 4.0–10.5)

## 2017-01-31 LAB — GLUCOSE, CAPILLARY
GLUCOSE-CAPILLARY: 102 mg/dL — AB (ref 65–99)
Glucose-Capillary: 134 mg/dL — ABNORMAL HIGH (ref 65–99)

## 2017-01-31 LAB — COMPREHENSIVE METABOLIC PANEL
ALBUMIN: 2.5 g/dL — AB (ref 3.5–5.0)
ALT: 17 U/L (ref 14–54)
ANION GAP: 8 (ref 5–15)
AST: 27 U/L (ref 15–41)
Alkaline Phosphatase: 75 U/L (ref 38–126)
BILIRUBIN TOTAL: 0.3 mg/dL (ref 0.3–1.2)
BUN: 9 mg/dL (ref 6–20)
CO2: 20 mmol/L — ABNORMAL LOW (ref 22–32)
Calcium: 8.3 mg/dL — ABNORMAL LOW (ref 8.9–10.3)
Chloride: 108 mmol/L (ref 101–111)
Creatinine, Ser: 1.03 mg/dL — ABNORMAL HIGH (ref 0.44–1.00)
GFR calc Af Amer: 58 mL/min — ABNORMAL LOW (ref >60)
GFR, EST NON AFRICAN AMERICAN: 50 mL/min — AB (ref >60)
Glucose, Bld: 95 mg/dL (ref 65–99)
POTASSIUM: 4.2 mmol/L (ref 3.5–5.1)
Sodium: 136 mmol/L (ref 135–145)
TOTAL PROTEIN: 5.4 g/dL — AB (ref 6.5–8.1)

## 2017-01-31 LAB — MAGNESIUM: MAGNESIUM: 1.6 mg/dL — AB (ref 1.7–2.4)

## 2017-01-31 LAB — PHOSPHORUS: Phosphorus: 3.1 mg/dL (ref 2.5–4.6)

## 2017-01-31 MED ORDER — DEXTROSE 5 % IV SOLN
1.0000 g | INTRAVENOUS | Status: DC
  Administered 2017-01-31: 1 g via INTRAVENOUS
  Filled 2017-01-31: qty 1

## 2017-01-31 MED ORDER — MAGNESIUM SULFATE 2 GM/50ML IV SOLN
2.0000 g | Freq: Once | INTRAVENOUS | Status: AC
  Administered 2017-01-31: 2 g via INTRAVENOUS
  Filled 2017-01-31: qty 50

## 2017-01-31 MED ORDER — SODIUM CHLORIDE 0.9 % IV BOLUS (SEPSIS)
1000.0000 mL | Freq: Once | INTRAVENOUS | Status: AC
  Administered 2017-01-31: 1000 mL via INTRAVENOUS

## 2017-01-31 MED ORDER — SODIUM CHLORIDE 0.9 % IV SOLN
INTRAVENOUS | Status: DC
  Administered 2017-01-31 – 2017-02-03 (×6): via INTRAVENOUS

## 2017-01-31 NOTE — Progress Notes (Signed)
PROGRESS NOTE  Tonya Payne  HYQ:[id number] DOB: Sep 30, 1934 DOA: 01/29/2017 PCP: Rocky Morel, MD   Brief Narrative:  Tonya Payne is a 82 y.o. female with medical history significant for CAD status post CABG, AAA, chronic kidney disease stage III, and admission last month for fall with acute thoracic compression fracture, discharged to SNF for rehabilitation, and now presenting after a syncopal episode. Patient reports that she had initially been doing well at the rehabilitation facility, but reports developing nausea with vomiting and non-bloody diarrhea over the past 2 days. She reports lightheadedness upon standing associated with this, reports that she has suffered a loss of consciousness multiple times over the last 2 days, typically upon standing. This afternoon, she was reportedly exercising on a recumbent bike at the rehabilitation facility, stood up, and suffered a brief loss of consciousness without hitting her head. EMS was called for transport to the hospital. She denies fevers or chills, denies chest pain, denies shortness of breath or cough, and denies abdominal pain. She does not know of any other residents at her facility with similar GI illness. Prior to the last 2 days, she does not recall ever having a syncopal episode before.   Upon arrival to the ED, patient was found to be afebrile, saturating well on room air, and with vital signs otherwise stable. Patient later developed acute vomiting with jump in her heart rate to 130s and marked elevation in blood pressure. CTA of the chest abdomen and pelvis is obtained and negative for acute pathology, but notable for stable infrarenal fusiform AAA without appreciable change since the CT performed one month ago. Patient was treated with empiric Rocephin for suspected UTI, given a dose of 10 mg labetalol, had systolic blood pressure drop into the 70s, and was treated with a liter of normal saline. Abx were changed to Cefepime and patient was  admitted for Syncope likley from N/V/D and Suspected recurrent UTI. Patient felt better today but when working with PT became Orthostatic again and could not progress.   Assessment & Plan:   Principal Problem:   Syncope Active Problems:   UTI (urinary tract infection)   AAA (abdominal aortic aneurysm) (HCC)   Hyponatremia   Hypertension   CKD (chronic kidney disease), stage III   COPD (chronic obstructive pulmonary disease) (HCC)   CAD (coronary artery disease)   Nausea vomiting and diarrhea   Hypertensive urgency   Hypomagnesemia   Orthostatic hypotension  1. Syncope likely from Dehydration and Orthostasis - Pt presents following a syncopal episode at her SNF  - Reports multiple such episodes over past 2 days, states that it has been upon standing  - She is too symptomatic in ED for orthostatic vitals; Done today an dropped - With recent N/V/D and apparent hypovolemia, suspect this is orthostatic and confirmed - Plan to continue cardiac monitoring, continue IVF hydration with NS at 75 mL/hr - Given 1 Liter Bolus  - ECHOCARDIOGRAM Done and showed Normal LV size and systolic function, EF 60-65%. Normal RV size and systolic function. No significant valvular abnormalities - Strict I's and O's  2. Nausea, Vomiting, Diarrhea, improved - Pt reports N/V/D for past 2 days, does not know of anyone else at the SNF with similar sxs  - Denies blood in vomitus or stool, and denies abd pain   - Recently treated with Rocephin for UTI  - Continue IVF hydration, prn antiemetics with Zofran 4 mg po/IV, send stool for GI panel and C diff if diarrhea recurs  -  No recurrent Diarrhea today  - Was still orthostatic today   3. Hypertension, labile BP  - BP initially wnl in ED, became very high in setting of active vomiting, was treated with 10 mg IV labetalol, and then dropped SBP to mid-70's  - Plan to continue IVF hydration with NS at 75 mL/hr, continue Metoprolol 25 mg po BID with holding  parameters    4. CAD   - No anginal complaints  - Troponin undetectable x2 in ED several hrs apart  - Continue cardiac monitoring, continue Atorvastatin 40 mg po qHS, ASA 81 mg, and Metoprolol 25 mg po BID   - C/w Telemetry Monitoring   5. Hyponatremia  - Serum sodium is 131 on admission in the setting of hypovolemia  - She was treated in the ED with 1 liter NS and will be continued on NS infusion  - Sodium improved to 136 - Repeat chem panel in am   6. Recurrent E Coli UTI  - UA is suggestive of infection and suprapubic tenderness noted - WBC went from 11.4 -> 15.0 -> 8.7 - Prior cultures have grown multiple species  - She has been hospitalized recently and treated with Rocephin at that time for UTI, now residing in SNF, will treat empirically with Cefepime pending culture data and clinical course  - Obtained Blood Cx and Showed NGTD at 1 day - Urine was purulent when Foley was inserted and patient has Flank Tenderness; Urine Cx showed >100,000 CFU of E Coli - Obtained Kidney Ultrasound and showed No evidence of acute abnormality. No evidence of hydronephrosis. Upper limits of normal renal echogenicity with renal cortical thinning, mild on the right and moderate on the left.  7. CKD stage III  - SCr is 1.25 on admission, up from 1-1.2 last month  - She appears hypovolemic on admission, had been given a liter of NS in ED, and will be continued on NS infusion at 125 mL/hr for 8 hours and then 75 mL/hr - Repeat BUN/Cr this AM was 9/1.03 - Repeat Chem Panel in am   8. AAA - Stable from scan 1 month ago, followed by vascular surgery and asymptomatic  - Continue outpt vascular surgery follow-up   9. Orthostatic Hypotension -BP dropped from Sitting to Standing -Given another IVF Bolus -C/w IVF at 75 mL/hr -Order TED Hose -Repeat Orthostatics in AM  -PT recommending SNF  10. Hypomagnesemia -Patient's Mag level was 1.6 -Replete with IV Mag Sulfate 2 grams -Continue to  Monitor and Replete as Necessary -Repeat CMP in AM   DVT prophylaxis: Heparin 5,000 units sq q8h Code Status: DO NOT RESUSCITATE Family Communication: None Disposition Plan: SNF when medically stable for D/C   Consultants:   None   Procedures: ECHOCARDIOGRAM ------------------------------------------------------------------- Study Conclusions  - Left ventricle: The cavity size was normal. Wall thickness was   normal. Systolic function was normal. The estimated ejection   fraction was in the range of 60% to 65%. Wall motion was normal;   there were no regional wall motion abnormalities. Doppler   parameters are consistent with abnormal left ventricular   relaxation (grade 1 diastolic dysfunction). - Aortic valve: There was no stenosis. - Mitral valve: There was trivial regurgitation. - Right ventricle: The cavity size was normal. Systolic function   was normal. - Pulmonary arteries: No complete TR doppler jet so unable to   estimate PA systolic pressure. - Inferior vena cava: The vessel was normal in size. The   respirophasic diameter changes were  in the normal range (>= 50%),   consistent with normal central venous pressure.  Impressions:  - Normal LV size and systolic function, EF 60-65%. Normal RV size   and systolic function. No significant valvular abnormalities.   Antimicrobials:  Anti-infectives    Start     Dose/Rate Route Frequency Ordered Stop   01/31/17 2100  ceFEPIme (MAXIPIME) 1 g in dextrose 5 % 50 mL IVPB     1 g 100 mL/hr over 30 Minutes Intravenous Every 24 hours 01/31/17 0845     01/29/17 2345  ceFEPIme (MAXIPIME) 1 g in dextrose 5 % 50 mL IVPB  Status:  Discontinued     1 g 100 mL/hr over 30 Minutes Intravenous Every 24 hours 01/29/17 2334 01/31/17 0845   01/29/17 2230  cefTRIAXone (ROCEPHIN) 1 g in dextrose 5 % 50 mL IVPB     1 g 100 mL/hr over 30 Minutes Intravenous  Once 01/29/17 2228 01/29/17 2303     Subjective: Seen and examined and  states she felt better and was not having as much abdominal tenderness today. No CP or SOB. Denied any other complaints or concerns at this time.   Objective: Vitals:   01/30/17 2158 01/31/17 0328 01/31/17 1113 01/31/17 1220  BP: 132/75 115/63 100/60 (!) 115/59  Pulse: 76 80 97 91  Resp: 16 15  18   Temp: 98.3 F (36.8 C) 97.6 F (36.4 C)  98.8 F (37.1 C)  TempSrc: Oral   Oral  SpO2: 97% 95%  100%  Weight:      Height:        Intake/Output Summary (Last 24 hours) at 01/31/17 1541 Last data filed at 01/31/17 1500  Gross per 24 hour  Intake              685 ml  Output             1050 ml  Net             -365 ml   Filed Weights   01/29/17 1340 01/30/17 0131  Weight: 45.4 kg (100 lb) 50.4 kg (111 lb 1.6 oz)   Examination: Physical Exam:  Constitutional: Thin Caucasian female in NAD appears calm and comfortable Eyes: Sclerae anicteric. Lids normal ENMT: External ears and nose appear normal. Grossly normal hearing Neck: Appears supple with no JVD Respiratory: Diminished to ausculation bilaterally. No appreciable wheezing/rales/rhonchi. Patient was not tachypenic  Cardiovascular: RRR; no m/r/g. No extremity edema Abdomen: Soft, Mildly tender in Lower quadrants over bladder. Bowel sounds present GU: Deferred. Foley cather in place draining clear yellow urine Musculoskeletal: No contractures; No cyanosis Skin: Warm and dry. No rashes or lesions on a limited skin eval Neurologic: CN 2-12 grossly intact. No appreciable focal deficits Psychiatric: Normal mood and affect. Pleasant. Intact judgement and insight  Data Reviewed: I have personally reviewed following labs and imaging studies  CBC:  Recent Labs Lab 01/29/17 1343 01/30/17 0421 01/31/17 0417  WBC 11.4* 15.0* 8.7  NEUTROABS  --  13.0* 5.3  HGB 11.8* 10.3* 10.4*  HCT 36.3 32.1* 32.3*  MCV 92.8 92.8 93.6  PLT 265 253 237   Basic Metabolic Panel:  Recent Labs Lab 01/29/17 1343 01/30/17 0421 01/31/17 0417    NA 131* 133* 136  K 4.3 3.9 4.2  CL 98* 104 108  CO2 23 20* 20*  GLUCOSE 120* 156* 95  BUN 9 11 9   CREATININE 1.25* 1.22* 1.03*  CALCIUM 9.2 8.4* 8.3*  MG  --   --  1.6*  PHOS  --   --  3.1   GFR: Estimated Creatinine Clearance: 31.3 mL/min (A) (by C-G formula based on SCr of 1.03 mg/dL (H)). Liver Function Tests:  Recent Labs Lab 01/29/17 1341 01/31/17 0417  AST 31 27  ALT 18 17  ALKPHOS 107 75  BILITOT 0.7 0.3  PROT 7.3 5.4*  ALBUMIN 3.3* 2.5*    Recent Labs Lab 01/29/17 1341  LIPASE 30   No results for input(s): AMMONIA in the last 168 hours. Coagulation Profile: No results for input(s): INR, PROTIME in the last 168 hours. Cardiac Enzymes: No results for input(s): CKTOTAL, CKMB, CKMBINDEX, TROPONINI in the last 168 hours. BNP (last 3 results) No results for input(s): PROBNP in the last 8760 hours. HbA1C: No results for input(s): HGBA1C in the last 72 hours. CBG:  Recent Labs Lab 01/29/17 1403 01/30/17 0803 01/31/17 0813 01/31/17 1231  GLUCAP 108* 113* 102* 134*   Lipid Profile: No results for input(s): CHOL, HDL, LDLCALC, TRIG, CHOLHDL, LDLDIRECT in the last 72 hours. Thyroid Function Tests: No results for input(s): TSH, T4TOTAL, FREET4, T3FREE, THYROIDAB in the last 72 hours. Anemia Panel: No results for input(s): VITAMINB12, FOLATE, FERRITIN, TIBC, IRON, RETICCTPCT in the last 72 hours. Sepsis Labs: No results for input(s): PROCALCITON, LATICACIDVEN in the last 168 hours.  Recent Results (from the past 240 hour(s))  Urine Culture     Status: Abnormal (Preliminary result)   Collection Time: 01/29/17  4:05 PM  Result Value Ref Range Status   Specimen Description URINE, RANDOM  Final   Special Requests NONE  Final   Culture >=100,000 COLONIES/mL ESCHERICHIA COLI (A)  Final   Report Status PENDING  Incomplete  MRSA PCR Screening     Status: None   Collection Time: 01/30/17  3:55 AM  Result Value Ref Range Status   MRSA by PCR NEGATIVE NEGATIVE  Final    Comment:        The GeneXpert MRSA Assay (FDA approved for NASAL specimens only), is one component of a comprehensive MRSA colonization surveillance program. It is not intended to diagnose MRSA infection nor to guide or monitor treatment for MRSA infections.   Culture, blood (routine x 2)     Status: None (Preliminary result)   Collection Time: 01/30/17  9:01 AM  Result Value Ref Range Status   Specimen Description BLOOD LEFT ANTECUBITAL  Final   Special Requests IN PEDIATRIC BOTTLE Blood Culture adequate volume  Final   Culture NO GROWTH 1 DAY  Final   Report Status PENDING  Incomplete  Culture, blood (routine x 2)     Status: None (Preliminary result)   Collection Time: 01/30/17  9:01 AM  Result Value Ref Range Status   Specimen Description BLOOD BLOOD LEFT ARM  Final   Special Requests   Final    BOTTLES DRAWN AEROBIC ONLY Blood Culture adequate volume   Culture NO GROWTH 1 DAY  Final   Report Status PENDING  Incomplete    Radiology Studies: US Renal  Result Date: 01/31/2017 CLINICAL DATA:  82 year old female with bilateral flank pain for several months. EXAM: RENAL / URINARY TRACT ULTRASOUND COMPLETE COMPARISON:  01/29/2017 CT FINDINGS: Right Kidney: Length: 9.8 cm. Upper limits of normal renal echogenicity with mild cortical thinning. No evidence of hydronephrosis or solid mass. Left Kidney: Length: 8.6 cm. Upper limits of normal renal echogenicity with moderate cortical thinning. No evidence of hydronephrosis or solid mass. Bladder: Foley catheter within the bladder noted.  No definite abnormalities.  IMPRESSION: 1. No evidence of acute abnormality.  No evidence of hydronephrosis. 2. Upper limits of normal renal echogenicity with renal cortical thinning, mild on the right and moderate on the left. Electronically Signed   By: Harmon Pier M.D.   On: 01/31/2017 08:13   Ct L-spine No Charge  Result Date: 01/29/2017 CLINICAL DATA:  82 year old female with back pain and  syncope. Known abdominal aortic aneurysm. T9 compression fracture in August after a fall. EXAM: CT LUMBAR SPINE WITHOUT CONTRAST TECHNIQUE: Multidetector CT imaging of the lumbar spine was performed without intravenous contrast administration. Multiplanar CT image reconstructions were also generated. COMPARISON:  CTA Abdomen and Pelvis today reported separately. FINDINGS: Segmentation: Normal. The same numbering system was used on 12/29/2016 CT chest abdomen and pelvis. Alignment: Stable and largely normal lumbar vertebral height and alignment. Trace anterolisthesis of L5 on S1 is stable. Vertebrae: Osteopenia. No acute osseous abnormality identified. Visible sacrum and SI joints appear intact. Paraspinal and other soft tissues: Partially visible large 6 cm diameter infrarenal abdominal aortic aneurysm. See CT Abdomen and Pelvis today reported separately. No acute findings in the visualized posterior paraspinal soft tissues. Disc levels: T12-L1:  Negative. L1-L2:  Negative. L2-L3: Mild disc bulging with bilateral broad-based foraminal component. Mild epidural lipomatosis and facet hypertrophy. Mild if any spinal stenosis. No convincing foraminal stenosis. L3-L4: Mild circumferential disc bulge. Mild facet hypertrophy. No stenosis. L4-L5: Circumferential disc bulge eccentric to the right with broad-based posterior component. Prior laminectomy. Mild to moderate residual facet hypertrophy. Mild to moderate left and mild right L4 neural foraminal stenosis. L5-S1: Trace anterolisthesis. Circumferential disc bulge with broad-based posterior component. Prior laminectomy with severe residual facet hypertrophy. Right side vacuum facet. No spinal stenosis. Borderline to mild bilateral lateral recess and foraminal stenosis. IMPRESSION: 1. Partially visible large 6 cm infrarenal abdominal aortic aneurysm with prominent growth over the past 12 months suggesting an increased risk of aneurysm rupture. Recommend Vascular Surgery  referral/consultation if not already obtained. 2. See also CTA Abdomen and Pelvis today reported separately. 3.  No acute osseous abnormality in the lumbar spine. 4. Mild if any degenerative lumbar spinal stenosis. Prior laminectomies at L4-L5 and L5-S1 with moderate to severe residual facet hypertrophy resulting in primarily mild bilateral foraminal stenosis. Electronically Signed   By: Odessa Fleming M.D.   On: 01/29/2017 16:59   Dg Chest Port 1 View  Result Date: 01/29/2017 CLINICAL DATA:  Chest pain and acute onset shortness of breath for 1 week. EXAM: PORTABLE CHEST 1 VIEW COMPARISON:  CT chest December 29, 2016 and chest radiograph November 12, 2016 FINDINGS: Cardiac silhouette is mildly enlarged and unchanged. Status post median sternotomy for CABG. Calcified aortic knob. Mild chronic interstitial changes increased lung volumes without pleural effusion. Bibasilar strandy densities. No pleural effusion or focal consolidation. Biapical pleural thickening. No pneumothorax. Soft tissue planes and included osseous structures are nonacute. Osteopenia. IMPRESSION: Stable cardiomegaly.  COPD and bibasilar fibrosis. Electronically Signed   By: Awilda Metro M.D.   On: 01/29/2017 21:47   Ct Angio Abd/pel W And/or Wo Contrast  Result Date: 01/29/2017 CLINICAL DATA:  Abdominal aortic aneurysm fall.  Syncopal episodes. EXAM: CTA ABDOMEN AND PELVIS WITHOUT AND WITH CONTRAST TECHNIQUE: Multidetector CT imaging of the abdomen and pelvis was performed using the standard protocol during bolus administration of intravenous contrast. Multiplanar reconstructed images and MIPs were obtained and reviewed to evaluate the vascular anatomy. CONTRAST:  80 cc Isovue 370 IV COMPARISON:  12/29/2016 CT FINDINGS: VASCULAR Aorta: Infrarenal abdominal aortic aneurysm  is again noted, stable in appearance, estimated at 5.9 x 5.4 cm without periaortic stranding to suggest rupture. Measurements are similar to most recent prior exam though reported  as being 6 x 5.9 cm but should have been 6 x 5.49 cm. Extensive atherosclerotic atherosclerosis with mural thrombus is again seen. No acute vascular injury, retroperitoneal fluid or hemorrhage. Celiac: Patent with moderate calcific atherosclerosis at its origin with a 50% stenosis suggested. No evidence of dissection, vasculitis or significant stenosis. SMA: Patent with mild-to-moderate atherosclerosis at the origin with less than 50% stenosis suggested. No evidence of aneurysm, dissection, vasculitis or significant stenosis. Renals: Single renal arteries to both kidneys with atherosclerosis along the proximal right renal artery and origin of the left with at least 50% stenosis suggested on the right and also likely in the left. No dissection, or evidence of fibromuscular dysplasia. No aneurysm. Perfusion is symmetric both kidneys however. IMA: Not definitively identified and is likely occluded. Inflow: Moderate atherosclerosis of the common iliac arteries without aneurysm, dissection or significant stenosis. Thin attenuated but patent internal iliac arteries with small external iliac arteries. Proximal Outflow: Patent without evidence of aneurysm, dissection, vasculitis or significant stenosis. Veins: No obvious venous abnormality within the limitations of this arterial phase study. Review of the MIP images confirms the above findings. NON-VASCULAR Lower chest: Extensive bilateral centrilobular and paraseptal emphysema with subpleural areas of fibrosis. Stable right lower lobe 8 mm opacity may reflect an area of chronic scarring, unchanged since recent comparison from 12/29/2016 although new since 10/23/2015. Hepatobiliary: No focal liver abnormality is seen. No gallstones, gallbladder wall thickening, or biliary dilatation. Pancreas: Unremarkable. No pancreatic ductal dilatation or surrounding inflammatory changes. Spleen: Normal in size without focal abnormality. Adrenals/Urinary Tract: Bilateral renal cortical  thinning more pronounced on the left. No obstructive uropathy or nephrolithiasis. Tiny exophytic hyperdense lesion in the upper pole left kidney may represent a hemorrhagic or proteinaceous cyst. This measures 4 mm and is too small to further characterize. Stomach/Bowel: Contracted stomach with normal small bowel rotation. No acute bowel inflammation or obstruction. Evidence of prior partial colectomy with anastomotic sutures along the expected location of the rectosigmoid. Small bowel anastomotic sutures are also seen in the mid pelvis. Appendix is not definitively identified but there is no evidence of right lower quadrant or pericecal inflammation. Lymphatic: No lymphadenopathy. Reproductive: Status post hysterectomy. No adnexal masses. Other: No abdominal wall hernia or abnormality. No abdominopelvic ascites. Musculoskeletal: No acute or significant osseous findings. IMPRESSION: VASCULAR 1. Re- demonstration of a stable infrarenal fusiform abdominal aortic aneurysm unchanged since CT from 12/29/2016. It is currently estimated at 5.9 x 5.4 cm. No evidence rupture or leak. 2. 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 NON-VASCULAR Centrilobular and paraseptal emphysema of the lung bases with interstitial fibrosis. Probable scarring at the right lung base accounting for an 8 mm opacity seen. Electronically Signed   By: Tollie Eth M.D.   On: 01/29/2017 19:02   Scheduled Meds: . aspirin EC  81 mg Oral Daily  . atorvastatin  40 mg Oral q1800  . fluticasone  1 spray Each Nare Daily  . heparin  5,000 Units Subcutaneous Q8H  . loratadine  10 mg Oral Daily  . metoprolol tartrate  25 mg Oral BID  . OLANZapine  5 mg Oral QHS  . sertraline  25 mg Oral q morning - 10a  . sodium  chloride flush  3 mL Intravenous Q12H   Continuous Infusions: . sodium chloride    .  ceFEPime (MAXIPIME) IV    . sodium chloride      LOS: 1 day   Merlene Laughter, DO [facility] Pager [phone]  If 7PM-7AM, please contact night-coverage [email or web address] Password [facility] 01/31/2017, 3:41 PM

## 2017-01-31 NOTE — ED Provider Notes (Signed)
[facility]-EMERGENCY DEPT Provider Note   CSN: [id number] Arrival date & time: 01/29/17  1317     History   Chief Complaint Chief Complaint  Patient presents with  . Near Syncope    HPI Tonya Payne is a 82 y.o. female.  HPI Patient presents to the emergency department with a syncopal episode that occurred when standing.  Patient has had 3 episodes similar to this in the past week.  She is in a rehabilitation facility after a fall and back injury.  Patient also has a known abdominal aortic aneurysm.  The patient states that today she passed out after getting up from a seated position.  Patient states nothing seems make the condition better or worseThe patient denies chest pain, shortness of breath, headache,blurred vision, neck pain, fever, cough, weakness, numbness, dizziness, anorexia, edema, abdominal pain, nausea, vomiting, diarrhea, rash, back pain, dysuria, hematemesis, bloody stool Past Medical History:  Diagnosis Date  . AAA (abdominal aortic aneurysm) without rupture (HCC) 2016   CTA 10/23/2015 - saccular infrarenal aneurysm roughly 4.8 cm in diameter. Stable from 2016 -- Dr. Darrick Penna  . Anterior wall myocardial infarction (HCC) 10/08/2015   Presented with severe chest pain and dynamic anterior ST elevations/biphasic elevations area did not meet full criteria for STEMI, and troponin was not dramatically elevated. 95% LAD treated with PTCA followed by CABG  . Anxiety   . Atrial fibrillation, new onset (HCC) 10/2015   after CABG, in SR at d/c  . CAD (coronary artery disease), native coronary artery 10/08/2015   Severe coronary disease with calcified left main and LAD: 5% left main going into ostial LAD. Mid LAD 95% (treated with PTCA). Proximal RCA 50%, calcified. EF was 20-25% with apical akinesis and distal anterior hypokinesis. --> Referred for CABG  . CAP (community acquired pneumonia) 07/2013   Hattie Perch 07/09/2013  . Congestive dilated cardiomyopathy (HCC) 10/08/2015   Intra-Op TEE  showed EF of 40 and 45%. She had an echo with a "normal LV function "documented, but there was no reading M.D.  . COPD (chronic obstructive pulmonary disease) (HCC)   . Depression   . Headache    "weekly" (12/31/2016)  . Hyperlipidemia LDL goal <70 10/08/2015  . IBS (irritable bowel syndrome)   . Migraine    "none in years" (12/31/2016)  . Oral thrush   . Post PTCA 10/08/2015   Presented with STEMI, severe mid LAD lesion treated with PTCA and then referred for CABG.  . TIA (transient ischemic attack)     Patient Active Problem List   Diagnosis Date Noted  . Syncope 01/29/2017  . Nausea vomiting and diarrhea 01/29/2017  . Hypertensive urgency 01/29/2017  . COPD (chronic obstructive pulmonary disease) (HCC) 12/30/2016  . Closed compression fracture of thoracic vertebra (HCC) 12/30/2016  . Abdominal pain 12/30/2016  . Alcohol use 12/30/2016  . CAD (coronary artery disease) 12/30/2016  . Scalp laceration   . Acute renal failure superimposed on stage 3 chronic kidney disease (HCC)   . Dehydration   . Physical deconditioning   . Pressure injury of skin 11/12/2016  . CKD (chronic kidney disease), stage III 09/24/2016  . Closed left hip fracture, with routine healing, subsequent encounter 09/23/2016  . Hyponatremia 09/23/2016  . Hypertension 09/23/2016  . Left displaced femoral neck fracture (HCC)   . Closed fracture of multiple pubic rami, right, sequela 02/27/2016  . Displaced fracture of neck of right second metacarpal bone with routine healing 02/27/2016  . Protein-calorie malnutrition, severe (HCC) 02/22/2016  . Orthostatic  dizziness 02/22/2016  . Postoperative atrial fibrillation (HCC) 02/22/2016  . Fall   . Hand fracture   . Pelvic fracture (HCC) 01/26/2016  . AAA (abdominal aortic aneurysm) (HCC) 11/30/2015  . Post PTCA   . S/P CABG x 3 10/16/2015  . Coronary artery disease involving native coronary artery with angina pectoris (HCC) 10/08/2015  . Congestive dilated  cardiomyopathy (HCC) 10/08/2015  . Hyperlipidemia LDL goal <70 10/08/2015  . UTI (urinary tract infection) 10/06/2015  . Myocardial infarction involving left anterior descending (LAD) coronary artery (HCC) 10/06/2015    Past Surgical History:  Procedure Laterality Date  . ABDOMINAL HYSTERECTOMY    . BACK SURGERY    . CARDIAC CATHETERIZATION N/A 10/06/2015   Procedure: Left Heart Cath and Coronary Angiography;  Surgeon: Marykay Lex, MD;  Location: [facility] INVASIVE CV LAB;  Service: Cardiovascular;: Heavily calcified left main-LAD. 75% LM & Ost LAD, 95% d-mLAD, p-mRCA Calcified 50%. EF ~20-25%  . CARDIAC CATHETERIZATION N/A 10/06/2015   Procedure: Coronary Balloon Angioplasty;  Surgeon: Marykay Lex, MD;  Location: [facility] INVASIVE CV LAB;  Service: Cardiovascular: PTCA of 95% LAD in setting of STEMI - reduced to 70% --> would need Atherectomy-PCI vs. CABG.  Sent for CABG.  . COLECTOMY    . CORONARY ARTERY BYPASS GRAFT N/A 10/16/2015   Procedure: CORONARY ARTERY BYPASS GRAFTING (CABG)  x three, using left internal mammary artery and right leg greater saphenous vein harvested endoscopically;  Surgeon: Loreli Slot, MD;  Location: [facility] OR;  Service: Open Heart Surgery;  Laterality: N/A;  . FRACTURE SURGERY    . HIP PINNING,CANNULATED Left 09/24/2016   Procedure: CANNULATED HIP PINNING LEFT HIP;  Surgeon: Durene Romans, MD;  Location: [facility] ORS;  Service: Orthopedics;  Laterality: Left;  . LUMBAR DISC SURGERY     L3,4,5; S1"  . NECK SURGERY    . TEE WITHOUT CARDIOVERSION N/A 10/16/2015   Procedure: TRANSESOPHAGEAL ECHOCARDIOGRAM (TEE);  Surgeon: Loreli Slot, MD;  Location: [facility] OR;  Service: Open Heart Surgery - IntraOp:  EF 40-45%.  . TONSILLECTOMY    . TOTAL HIP ARTHROPLASTY Right     OB History    No data available       Home Medications    Prior to Admission medications   Medication Sig Start Date End Date Taking? Authorizing Provider  aspirin EC 81 MG tablet Take 81 mg by mouth  daily.   Yes [provider]  atorvastatin (LIPITOR) 40 MG tablet Take 1 tablet (40 mg total) by mouth daily at 6 PM. Patient taking differently: Take 40 mg by mouth daily.  12/18/15  Yes Barrett, Erin R, PA-C  fexofenadine (ALLEGRA) 180 MG tablet Take 180 mg by mouth every evening.    Yes [provider]  fluticasone (FLONASE) 50 MCG/ACT nasal spray Place 1 spray into both nostrils daily. 11/16/16  Yes Vassie Loll, MD  megestrol (MEGACE) 40 MG/ML suspension Take 800 mg by mouth daily as needed for other. appetite 11/29/16  Yes [provider]  methocarbamol (ROBAXIN) 500 MG tablet Take 1 tablet (500 mg total) by mouth every 8 (eight) hours as needed for muscle spasms. 01/01/17  Yes Maxie Barb, MD  metoprolol tartrate (LOPRESSOR) 25 MG tablet Take 1 tablet (25 mg total) by mouth 2 (two) times daily. 01/01/17  Yes Maxie Barb, MD  Multiple Vitamins-Minerals (MULTIVITAMIN ADULTS PO) Take 1 tablet by mouth daily.    Yes [provider]  OLANZapine (ZYPREXA) 5 MG tablet Take 5 mg  by mouth every evening.  04/26/16  Yes [provider]  polyethylene glycol (MIRALAX / GLYCOLAX) packet Take 17 g by mouth daily as needed. Patient taking differently: Take 17 g by mouth daily as needed for mild constipation.  01/01/17  Yes Maxie Barb, MD  sennosides-docusate sodium (SENOKOT-S) 8.6-50 MG tablet Take 2 tablets by mouth at bedtime. Hold for loose stools   Yes [provider]  sertraline (ZOLOFT) 50 MG tablet Take 0.5 tablets (25 mg total) by mouth every morning. 11/16/16  Yes Vassie Loll, MD  traMADol (ULTRAM) 50 MG tablet Take 1 tablet (50 mg total) by mouth every 6 (six) hours as needed. Patient taking differently: Take 50 mg by mouth every 6 (six) hours as needed for moderate pain.  01/01/17  Yes Maxie Barb, MD  docusate sodium (COLACE) 100 MG capsule Take 1 capsule (100 mg total) by mouth 2 (two) times  daily. Patient not taking: Reported on 01/29/2017 09/24/16   Lanney Gins, PA-C  feeding supplement, ENSURE ENLIVE, (ENSURE ENLIVE) LIQD Take 237 mLs by mouth 2 (two) times daily between meals. Patient not taking: Reported on 01/29/2017 09/26/16   Merlene Laughter, DO    Family History Family History  Problem Relation Age of Onset  . Stroke Mother        dead  . Clotting disorder Father        dead  bone marrow disease    Social History Social History  Substance Use Topics  . Smoking status: Former Smoker    Packs/day: 0.50    Years: 50.00    Types: Cigarettes    Quit date: 04/11/2015  . Smokeless tobacco: Never Used  . Alcohol use Yes     Comment: 12/31/2016 "glass of wine maybe once/month"     Allergies   Patient has no known allergies.   Review of Systems Review of Systems All other systems negative except as documented in the HPI. All pertinent positives and negatives as reviewed in the HPI.  Physical Exam Updated Vital Signs BP 132/75 (BP Location: Right Arm)   Pulse 76   Temp 98.3 F (36.8 C) (Oral)   Resp 16   Ht 5' (1.524 m)   Wt 50.4 kg (111 lb 1.6 oz)   SpO2 97%   BMI 21.70 kg/m   Physical Exam  Constitutional: She is oriented to person, place, and time. She appears well-developed and well-nourished. No distress.  HENT:  Head: Normocephalic and atraumatic.  Mouth/Throat: Oropharynx is clear and moist.  Eyes: Pupils are equal, round, and reactive to light.  Neck: Normal range of motion. Neck supple.  Cardiovascular: Normal rate, regular rhythm and normal heart sounds.  Exam reveals no gallop and no friction rub.   No murmur heard. Pulmonary/Chest: Effort normal and breath sounds normal. No respiratory distress. She has no wheezes.  Abdominal: Soft. Bowel sounds are normal. She exhibits no distension and no mass. There is tenderness. There is no guarding.  Neurological: She is alert and oriented to person, place, and time. She exhibits normal  muscle tone. Coordination normal.  Skin: Skin is warm and dry. Capillary refill takes less than 2 seconds. No rash noted. No erythema.  Psychiatric: She has a normal mood and affect. Her behavior is normal.  Nursing note and vitals reviewed.    ED Treatments / Results  Labs (all labs ordered are listed, but only abnormal results are displayed) Labs Reviewed  BASIC METABOLIC PANEL - Abnormal; Notable for the following:  Result Value   Sodium 131 (*)    Chloride 98 (*)    Glucose, Bld 120 (*)    Creatinine, Ser 1.25 (*)    GFR calc non Af Amer 40 (*)    GFR calc Af Amer 46 (*)    All other components within normal limits  CBC - Abnormal; Notable for the following:    WBC 11.4 (*)    Hemoglobin 11.8 (*)    All other components within normal limits  URINALYSIS, ROUTINE W REFLEX MICROSCOPIC - Abnormal; Notable for the following:    APPearance HAZY (*)    Protein, ur 30 (*)    Leukocytes, UA MODERATE (*)    Bacteria, UA FEW (*)    All other components within normal limits  HEPATIC FUNCTION PANEL - Abnormal; Notable for the following:    Albumin 3.3 (*)    Bilirubin, Direct <0.1 (*)    All other components within normal limits  BASIC METABOLIC PANEL - Abnormal; Notable for the following:    Sodium 133 (*)    CO2 20 (*)    Glucose, Bld 156 (*)    Creatinine, Ser 1.22 (*)    Calcium 8.4 (*)    GFR calc non Af Amer 41 (*)    GFR calc Af Amer 47 (*)    All other components within normal limits  CBC WITH DIFFERENTIAL/PLATELET - Abnormal; Notable for the following:    WBC 15.0 (*)    RBC 3.46 (*)    Hemoglobin 10.3 (*)    HCT 32.1 (*)    Neutro Abs 13.0 (*)    All other components within normal limits  GLUCOSE, CAPILLARY - Abnormal; Notable for the following:    Glucose-Capillary 113 (*)    All other components within normal limits  CBG MONITORING, ED - Abnormal; Notable for the following:    Glucose-Capillary 108 (*)    All other components within normal limits  MRSA  PCR SCREENING  URINE CULTURE  CULTURE, BLOOD (ROUTINE X 2)  CULTURE, BLOOD (ROUTINE X 2)  LIPASE, BLOOD  CBC WITH DIFFERENTIAL/PLATELET  COMPREHENSIVE METABOLIC PANEL  MAGNESIUM  PHOSPHORUS  I-STAT TROPONIN, ED  I-STAT TROPONIN, ED    EKG  EKG Interpretation  Date/Time:  Wednesday January 29 2017 13:30:40 EDT Ventricular Rate:  91 PR Interval:  152 QRS Duration: 62 QT Interval:  370 QTC Calculation: 455 R Axis:   32 Text Interpretation:  Normal sinus rhythm Nonspecific T wave abnormality Abnormal ECG No significant change since last tracing Confirmed by Richardean Canal [phone]) on 01/29/2017 3:26:10 PM       Radiology Ct L-spine No Charge  Result Date: 01/29/2017 CLINICAL DATA:  82 year old female with back pain and syncope. Known abdominal aortic aneurysm. T9 compression fracture in August after a fall. EXAM: CT LUMBAR SPINE WITHOUT CONTRAST TECHNIQUE: Multidetector CT imaging of the lumbar spine was performed without intravenous contrast administration. Multiplanar CT image reconstructions were also generated. COMPARISON:  CTA Abdomen and Pelvis today reported separately. FINDINGS: Segmentation: Normal. The same numbering system was used on 12/29/2016 CT chest abdomen and pelvis. Alignment: Stable and largely normal lumbar vertebral height and alignment. Trace anterolisthesis of L5 on S1 is stable. Vertebrae: Osteopenia. No acute osseous abnormality identified. Visible sacrum and SI joints appear intact. Paraspinal and other soft tissues: Partially visible large 6 cm diameter infrarenal abdominal aortic aneurysm. See CT Abdomen and Pelvis today reported separately. No acute findings in the visualized posterior paraspinal soft tissues. Disc levels: T12-L1:  Negative. L1-L2:  Negative. L2-L3: Mild disc bulging with bilateral broad-based foraminal component. Mild epidural lipomatosis and facet hypertrophy. Mild if any spinal stenosis. No convincing foraminal stenosis. L3-L4: Mild  circumferential disc bulge. Mild facet hypertrophy. No stenosis. L4-L5: Circumferential disc bulge eccentric to the right with broad-based posterior component. Prior laminectomy. Mild to moderate residual facet hypertrophy. Mild to moderate left and mild right L4 neural foraminal stenosis. L5-S1: Trace anterolisthesis. Circumferential disc bulge with broad-based posterior component. Prior laminectomy with severe residual facet hypertrophy. Right side vacuum facet. No spinal stenosis. Borderline to mild bilateral lateral recess and foraminal stenosis. IMPRESSION: 1. Partially visible large 6 cm infrarenal abdominal aortic aneurysm with prominent growth over the past 12 months suggesting an increased risk of aneurysm rupture. Recommend Vascular Surgery referral/consultation if not already obtained. 2. See also CTA Abdomen and Pelvis today reported separately. 3.  No acute osseous abnormality in the lumbar spine. 4. Mild if any degenerative lumbar spinal stenosis. Prior laminectomies at L4-L5 and L5-S1 with moderate to severe residual facet hypertrophy resulting in primarily mild bilateral foraminal stenosis. Electronically Signed   By: Odessa Fleming M.D.   On: 01/29/2017 16:59   Dg Chest Port 1 View  Result Date: 01/29/2017 CLINICAL DATA:  Chest pain and acute onset shortness of breath for 1 week. EXAM: PORTABLE CHEST 1 VIEW COMPARISON:  CT chest December 29, 2016 and chest radiograph November 12, 2016 FINDINGS: Cardiac silhouette is mildly enlarged and unchanged. Status post median sternotomy for CABG. Calcified aortic knob. Mild chronic interstitial changes increased lung volumes without pleural effusion. Bibasilar strandy densities. No pleural effusion or focal consolidation. Biapical pleural thickening. No pneumothorax. Soft tissue planes and included osseous structures are nonacute. Osteopenia. IMPRESSION: Stable cardiomegaly.  COPD and bibasilar fibrosis. Electronically Signed   By: Awilda Metro M.D.   On:  01/29/2017 21:47   Ct Angio Abd/pel W And/or Wo Contrast  Result Date: 01/29/2017 CLINICAL DATA:  Abdominal aortic aneurysm fall.  Syncopal episodes. EXAM: CTA ABDOMEN AND PELVIS WITHOUT AND WITH CONTRAST TECHNIQUE: Multidetector CT imaging of the abdomen and pelvis was performed using the standard protocol during bolus administration of intravenous contrast. Multiplanar reconstructed images and MIPs were obtained and reviewed to evaluate the vascular anatomy. CONTRAST:  80 cc Isovue 370 IV COMPARISON:  12/29/2016 CT FINDINGS: VASCULAR Aorta: Infrarenal abdominal aortic aneurysm is again noted, stable in appearance, estimated at 5.9 x 5.4 cm without periaortic stranding to suggest rupture. Measurements are similar to most recent prior exam though reported as being 6 x 5.9 cm but should have been 6 x 5.49 cm. Extensive atherosclerotic atherosclerosis with mural thrombus is again seen. No acute vascular injury, retroperitoneal fluid or hemorrhage. Celiac: Patent with moderate calcific atherosclerosis at its origin with a 50% stenosis suggested. No evidence of dissection, vasculitis or significant stenosis. SMA: Patent with mild-to-moderate atherosclerosis at the origin with less than 50% stenosis suggested. No evidence of aneurysm, dissection, vasculitis or significant stenosis. Renals: Single renal arteries to both kidneys with atherosclerosis along the proximal right renal artery and origin of the left with at least 50% stenosis suggested on the right and also likely in the left. No dissection, or evidence of fibromuscular dysplasia. No aneurysm. Perfusion is symmetric both kidneys however. IMA: Not definitively identified and is likely occluded. Inflow: Moderate atherosclerosis of the common iliac arteries without aneurysm, dissection or significant stenosis. Thin attenuated but patent internal iliac arteries with small external iliac arteries. Proximal Outflow: Patent without evidence of aneurysm, dissection,  vasculitis or  significant stenosis. Veins: No obvious venous abnormality within the limitations of this arterial phase study. Review of the MIP images confirms the above findings. NON-VASCULAR Lower chest: Extensive bilateral centrilobular and paraseptal emphysema with subpleural areas of fibrosis. Stable right lower lobe 8 mm opacity may reflect an area of chronic scarring, unchanged since recent comparison from 12/29/2016 although new since 10/23/2015. Hepatobiliary: No focal liver abnormality is seen. No gallstones, gallbladder wall thickening, or biliary dilatation. Pancreas: Unremarkable. No pancreatic ductal dilatation or surrounding inflammatory changes. Spleen: Normal in size without focal abnormality. Adrenals/Urinary Tract: Bilateral renal cortical thinning more pronounced on the left. No obstructive uropathy or nephrolithiasis. Tiny exophytic hyperdense lesion in the upper pole left kidney may represent a hemorrhagic or proteinaceous cyst. This measures 4 mm and is too small to further characterize. Stomach/Bowel: Contracted stomach with normal small bowel rotation. No acute bowel inflammation or obstruction. Evidence of prior partial colectomy with anastomotic sutures along the expected location of the rectosigmoid. Small bowel anastomotic sutures are also seen in the mid pelvis. Appendix is not definitively identified but there is no evidence of right lower quadrant or pericecal inflammation. Lymphatic: No lymphadenopathy. Reproductive: Status post hysterectomy. No adnexal masses. Other: No abdominal wall hernia or abnormality. No abdominopelvic ascites. Musculoskeletal: No acute or significant osseous findings. IMPRESSION: VASCULAR 1. Re- demonstration of a stable infrarenal fusiform abdominal aortic aneurysm unchanged since CT from 12/29/2016. It is currently estimated at 5.9 x 5.4 cm. No evidence rupture or leak. 2. Atherosclerotic change of the abdominal aorta and mesenteric vessels as above with  chronic likely greater than 50% stenosis of the celiac artery origin and of the bilateral renal arteries. NON-VASCULAR Centrilobular and paraseptal emphysema of the lung bases with interstitial fibrosis. Probable scarring at the right lung base accounting for an 8 mm opacity seen. Electronically Signed   By: Tollie Eth M.D.   On: 01/29/2017 19:02    Procedures Procedures (including critical care time)  Medications Ordered in ED Medications  methocarbamol (ROBAXIN) tablet 500 mg (not administered)  metoprolol tartrate (LOPRESSOR) tablet 25 mg (25 mg Oral Given 01/30/17 2140)  traMADol (ULTRAM) tablet 50 mg (50 mg Oral Given 01/30/17 1728)  aspirin EC tablet 81 mg (81 mg Oral Given 01/30/17 0945)  fluticasone (FLONASE) 50 MCG/ACT nasal spray 1 spray (1 spray Each Nare Given 01/30/17 0947)  sertraline (ZOLOFT) tablet 25 mg (25 mg Oral Given 01/30/17 0946)  OLANZapine (ZYPREXA) tablet 5 mg (5 mg Oral Given 01/30/17 2140)  loratadine (CLARITIN) tablet 10 mg (10 mg Oral Given 01/30/17 0946)  atorvastatin (LIPITOR) tablet 40 mg (40 mg Oral Given 01/30/17 1728)  sodium chloride flush (NS) 0.9 % injection 3 mL (3 mLs Intravenous Not Given 01/30/17 2200)  heparin injection 5,000 Units (5,000 Units Subcutaneous Given 01/30/17 2143)  0.9 %  sodium chloride infusion ( Intravenous New Bag/Given 01/30/17 0613)  acetaminophen (TYLENOL) tablet 650 mg (not administered)    Or  acetaminophen (TYLENOL) suppository 650 mg (not administered)  oxyCODONE (Oxy IR/ROXICODONE) immediate release tablet 5 mg (5 mg Oral Given 01/30/17 1954)  ondansetron (ZOFRAN) tablet 4 mg (not administered)    Or  ondansetron (ZOFRAN) injection 4 mg (not administered)  ceFEPIme (MAXIPIME) 1 g in dextrose 5 % 50 mL IVPB (1 g Intravenous New Bag/Given 01/31/17 0046)  0.9 %  sodium chloride infusion ( Intravenous New Bag/Given 01/30/17 2144)  sodium chloride 0.9 % bolus 1,000 mL (0 mLs Intravenous Stopped 01/29/17 1801)  iopamidol (ISOVUE-370) 76  % injection (80 mLs  Contrast Given 01/29/17 1613)  ondansetron (ZOFRAN) injection 4 mg (4 mg Intravenous Given 01/29/17 2141)  labetalol (NORMODYNE,TRANDATE) injection 10 mg (10 mg Intravenous Given 01/29/17 2234)  cefTRIAXone (ROCEPHIN) 1 g in dextrose 5 % 50 mL IVPB (0 g Intravenous Stopped 01/29/17 2303)  sodium chloride 0.9 % bolus 250 mL (0 mLs Intravenous Stopped 01/30/17 0039)     Initial Impression / Assessment and Plan / ED Course  I have reviewed the triage vital signs and the nursing notes.  Pertinent labs & imaging results that were available during my care of the patient were reviewed by me and considered in my medical decision making (see chart for details).   : Into the patient's room and the nurse, she became significantly tachycardic and started having vomiting off.  The patient was also complaining of abdominal discomfort.  Patient will be admitted to the hospital for further evaluation and care  Final Clinical Impressions(s) / ED Diagnoses   Final diagnoses:  Back pain  Syncope and collapse  Dizziness    New Prescriptions Current Discharge Medication List       Charlestine Night, Cordelia Poche 01/31/17 0121    Charlynne Pander, MD 02/01/17 [phone]

## 2017-01-31 NOTE — Evaluation (Signed)
Physical Therapy Evaluation Patient Details Name: Tonya Payne MRN: [id number] DOB: [birth year] Today's Date: 01/31/2017   History of Present Illness  Pt is an 82 y/o female who presents from SNF with a syncopal episode that occurred when standing. Patient had 3 episodes similar to this in the past week PTA. She is currently in [facility] after a fall and back injury (TLSO OOB). PMH significant for abdominal aortic aneurysm, T9 compression fx, coronary artery disease, A. fib, CABG, COPD, depression, L hip fx s/p hip pinning 09/24/16.   Clinical Impression  Pt admitted with above diagnosis. Pt currently with functional limitations due to the deficits listed below (see PT Problem List). At the time of PT eval pt was able to perform transfers in order to obtain orthostatic BP measurements (see below). She required up to mod assist for safe functional mobility, however was not able to progress to ambulation this session due to symptomatic drop in BP during sit>stand. Pt will benefit from skilled PT to increase their independence and safety with mobility to allow discharge to the venue listed below.     Orthostatic BPs  Supine 102/58  Sitting 109/76  Standing 76/32      Follow Up Recommendations SNF;Supervision/Assistance - 24 hour    Equipment Recommendations  None recommended by PT    Recommendations for Other Services       Precautions / Restrictions Precautions Precautions: Fall Required Braces or Orthoses: Spinal Brace Spinal Brace: Thoracolumbosacral orthotic Restrictions Weight Bearing Restrictions: No      Mobility  Bed Mobility Overal bed mobility: Needs Assistance Bed Mobility: Rolling;Sidelying to Sit;Sit to Sidelying Rolling: Min assist Sidelying to sit: Mod assist     Sit to sidelying: Mod assist General bed mobility comments: VC's for log roll technique. Pt required assist for all aspects of log roll including bringing LE's off EOB once in sidelying.    Transfers Overall transfer level: Needs assistance Equipment used: Rolling walker (2 wheeled) Transfers: Sit to/from Stand Sit to Stand: Min assist         General transfer comment: VC's for hand placement on seated surface for safety. Pt was able to power-up to full stand with heavy min assist for balance support. Heavy posterior lean noted and increased time required for pt to gain/maintain balance.  Ambulation/Gait             General Gait Details: Unable at this time  Stairs            Wheelchair Mobility    Modified Rankin (Stroke Patients Only)       Balance                                             Pertinent Vitals/Pain Pain Assessment: No/denies pain    Home Living Family/patient expects to be discharged to:: Skilled nursing facility                 Additional Comments: ILF apartment.     Prior Function Level of Independence: Needs assistance   Gait / Transfers Assistance Needed: Uses a RW with therapy but does not sound like she has been doing much distance from pt report.   ADL's / Homemaking Assistance Needed: Pt reports that the staff is doing "everything" when it comes to bathing and dressing.         Hand Dominance  Extremity/Trunk Assessment   Upper Extremity Assessment Upper Extremity Assessment: Defer to OT evaluation    Lower Extremity Assessment Lower Extremity Assessment: Generalized weakness    Cervical / Trunk Assessment Cervical / Trunk Assessment: Other exceptions Cervical / Trunk Exceptions: T9 compression fracture  Communication   Communication: No difficulties  Cognition Arousal/Alertness: Awake/alert Behavior During Therapy: Anxious Overall Cognitive Status: No family/caregiver present to determine baseline cognitive functioning                                        General Comments      Exercises     Assessment/Plan    PT Assessment Patient needs  continued PT services  PT Problem List Decreased strength;Decreased range of motion;Decreased activity tolerance;Decreased balance;Decreased mobility;Decreased knowledge of use of DME;Decreased safety awareness;Decreased knowledge of precautions;Pain;Decreased cognition       PT Treatment Interventions DME instruction;Gait training;Stair training;Functional mobility training;Therapeutic activities;Therapeutic exercise;Neuromuscular re-education;Patient/family education    PT Goals (Current goals can be found in the Care Plan section)  Acute Rehab PT Goals Patient Stated Goal: Go back to SNF PT Goal Formulation: With patient Time For Goal Achievement: 02/14/17 Potential to Achieve Goals: Fair    Frequency Min 2X/week   Barriers to discharge        Co-evaluation               AM-PAC PT "6 Clicks" Daily Activity  Outcome Measure Difficulty turning over in bed (including adjusting bedclothes, sheets and blankets)?: Unable Difficulty moving from lying on back to sitting on the side of the bed? : Unable Difficulty sitting down on and standing up from a chair with arms (e.g., wheelchair, bedside commode, etc,.)?: Unable Help needed moving to and from a bed to chair (including a wheelchair)?: A Little Help needed walking in hospital room?: A Lot Help needed climbing 3-5 steps with a railing? : Total 6 Click Score: 9    End of Session Equipment Utilized During Treatment: Gait belt;Back brace Activity Tolerance: Treatment limited secondary to medical complications (Comment) (Orthostatic hypotension) Patient left: in bed;with call bell/phone within reach Nurse Communication: Mobility status; BP measurements PT Visit Diagnosis: History of falling (Z91.81);Difficulty in walking, not elsewhere classified (R26.2)    Time: 9629-5284 PT Time Calculation (min) (ACUTE ONLY): 32 min   Charges:   PT Evaluation $PT Eval Moderate Complexity: 1 Mod PT Treatments $Therapeutic Activity:  8-22 mins   PT G Codes:        Conni Slipper, PT, DPT Acute Rehabilitation Services Pager: [phone]   Marylynn Pearson 01/31/2017, 2:23 PM

## 2017-02-01 LAB — COMPREHENSIVE METABOLIC PANEL
ALT: 19 U/L (ref 14–54)
ANION GAP: 6 (ref 5–15)
AST: 28 U/L (ref 15–41)
Albumin: 2.6 g/dL — ABNORMAL LOW (ref 3.5–5.0)
Alkaline Phosphatase: 81 U/L (ref 38–126)
BILIRUBIN TOTAL: 0.3 mg/dL (ref 0.3–1.2)
BUN: 7 mg/dL (ref 6–20)
CO2: 22 mmol/L (ref 22–32)
Calcium: 8.5 mg/dL — ABNORMAL LOW (ref 8.9–10.3)
Chloride: 108 mmol/L (ref 101–111)
Creatinine, Ser: 0.9 mg/dL (ref 0.44–1.00)
GFR, EST NON AFRICAN AMERICAN: 59 mL/min — AB (ref >60)
Glucose, Bld: 100 mg/dL — ABNORMAL HIGH (ref 65–99)
POTASSIUM: 4.1 mmol/L (ref 3.5–5.1)
Sodium: 136 mmol/L (ref 135–145)
TOTAL PROTEIN: 5.8 g/dL — AB (ref 6.5–8.1)

## 2017-02-01 LAB — CBC WITH DIFFERENTIAL/PLATELET
Basophils Absolute: 0 10*3/uL (ref 0.0–0.1)
Basophils Relative: 0 %
Eosinophils Absolute: 0.2 10*3/uL (ref 0.0–0.7)
Eosinophils Relative: 3 %
HEMATOCRIT: 31.8 % — AB (ref 36.0–46.0)
Hemoglobin: 10.4 g/dL — ABNORMAL LOW (ref 12.0–15.0)
LYMPHS ABS: 2.4 10*3/uL (ref 0.7–4.0)
Lymphocytes Relative: 35 %
MCH: 30.2 pg (ref 26.0–34.0)
MCHC: 32.7 g/dL (ref 30.0–36.0)
MCV: 92.4 fL (ref 78.0–100.0)
MONOS PCT: 11 %
Monocytes Absolute: 0.7 10*3/uL (ref 0.1–1.0)
NEUTROS ABS: 3.5 10*3/uL (ref 1.7–7.7)
NEUTROS PCT: 51 %
PLATELETS: 286 10*3/uL (ref 150–400)
RBC: 3.44 MIL/uL — ABNORMAL LOW (ref 3.87–5.11)
RDW: 12.9 % (ref 11.5–15.5)
WBC: 6.9 10*3/uL (ref 4.0–10.5)

## 2017-02-01 LAB — URINE CULTURE

## 2017-02-01 LAB — GLUCOSE, CAPILLARY
GLUCOSE-CAPILLARY: 96 mg/dL (ref 65–99)
GLUCOSE-CAPILLARY: 98 mg/dL (ref 65–99)

## 2017-02-01 LAB — PHOSPHORUS: PHOSPHORUS: 2.9 mg/dL (ref 2.5–4.6)

## 2017-02-01 LAB — MAGNESIUM: MAGNESIUM: 2.1 mg/dL (ref 1.7–2.4)

## 2017-02-01 MED ORDER — SODIUM CHLORIDE 0.9 % IV SOLN
2.0000 g | Freq: Three times a day (TID) | INTRAVENOUS | Status: DC
  Administered 2017-02-01 – 2017-02-03 (×5): 2 g via INTRAVENOUS
  Filled 2017-02-01 (×6): qty 2000

## 2017-02-01 MED ORDER — SODIUM CHLORIDE 0.9 % IV BOLUS (SEPSIS)
1000.0000 mL | Freq: Once | INTRAVENOUS | Status: AC
  Administered 2017-02-01: 1000 mL via INTRAVENOUS

## 2017-02-01 NOTE — Progress Notes (Signed)
Patient foley was leaking patient bladder scan , no urin in bladder. Assess the amount of fluid in the balloon and it was only 8 cc. Inserted 2 for a total of 10 will continue to monitor.

## 2017-02-01 NOTE — Progress Notes (Signed)
PROGRESS NOTE  Tonya Payne  ZOX:[id number] DOB: 11-22-1934 DOA: 01/29/2017 PCP: Rocky Morel, MD   Brief Narrative:  Tonya Payne is a 82 y.o. female with medical history significant for CAD status post CABG, AAA, chronic kidney disease stage III, and admission last month for fall with acute thoracic compression fracture, discharged to SNF for rehabilitation, and now presenting after a syncopal episode. Patient reports that she had initially been doing well at the rehabilitation facility, but reports developing nausea with vomiting and non-bloody diarrhea over the past 2 days. She reports lightheadedness upon standing associated with this, reports that she has suffered a loss of consciousness multiple times over the last 2 days, typically upon standing. This afternoon, she was reportedly exercising on a recumbent bike at the rehabilitation facility, stood up, and suffered a brief loss of consciousness without hitting her head. EMS was called for transport to the hospital. She denies fevers or chills, denies chest pain, denies shortness of breath or cough, and denies abdominal pain. She does not know of any other residents at her facility with similar GI illness. Prior to the last 2 days, she does not recall ever having a syncopal episode before.   Upon arrival to the ED, patient was found to be afebrile, saturating well on room air, and with vital signs otherwise stable. Patient later developed acute vomiting with jump in her heart rate to 130s and marked elevation in blood pressure. CTA of the chest abdomen and pelvis is obtained and negative for acute pathology, but notable for stable infrarenal fusiform AAA without appreciable change since the CT performed one month ago. Patient was treated with empiric Rocephin for suspected UTI, given a dose of 10 mg labetalol, had systolic blood pressure drop into the 70s, and was treated with a liter of normal saline. Abx were changed to Cefepime and patient was  admitted for Syncope likley from N/V/D and Suspected recurrent UTI. Urine Grew out >100,000 CFU of E Coli and will de-escalate Abx as Sensitives were Pansensitive and will change to Ampicillin. Patient felt better yesterday but when working with PT became Orthostatic again and could not progress. Will repeat   Assessment & Plan:   Principal Problem:   Syncope Active Problems:   UTI (urinary tract infection)   AAA (abdominal aortic aneurysm) (HCC)   Hyponatremia   Hypertension   CKD (chronic kidney disease), stage III   COPD (chronic obstructive pulmonary disease) (HCC)   CAD (coronary artery disease)   Nausea vomiting and diarrhea   Hypertensive urgency   Hypomagnesemia   Orthostatic hypotension  1. Syncope likely from Dehydration and Orthostasis - Pt presents following a syncopal episode at her SNF  - Reports multiple such episodes over past 2 days, states that it has been upon standing  - She is too symptomatic in ED for orthostatic vitals; Done today an dropped - With recent N/V/D and apparent hypovolemia, suspect this is orthostatic and confirmed - Plan to continue cardiac monitoring, continue IVF hydration with NS at 75 mL/hr - Given 1 Liter Bolus yesterday and another 1 Liter this AM - ECHOCARDIOGRAM Done and showed Normal LV size and systolic function, EF 60-65%. Normal RV size and systolic function. No significant valvular abnormalities - Strict I's and O's; Patient is +889.2 - Repeat Orthostatics   2. Nausea, Vomiting, Diarrhea, improved - Pt reports N/V/D for past 2 days, does not know of anyone else at the SNF with similar sxs  - Denies blood in vomitus or stool, and denies  abd pain   - Recently treated with Rocephin for UTI  - Continue IVF hydration at 75 mL/hr, prn antiemetics with Zofran 4 mg po/IV, send stool for GI panel and C diff if diarrhea recurs  - No recurrent Diarrhea today  - Was still orthostatic today   3. Hypertension  - BP initially wnl in ED,  became very high in setting of active vomiting, was treated with 10 mg IV labetalol, and then dropped SBP to mid-70's -Currently on lower Side  - Plan to continue IVF hydration with NS at 75 mL/hr for now, continue Metoprolol 25 mg po BID with holding parameters    4. CAD   - No anginal complaints  - Troponin undetectable x2 in ED several hrs apart  - Continue cardiac monitoring, continue Atorvastatin 40 mg po qHS, ASA 81 mg, and Metoprolol 25 mg po BID   - C/w Telemetry Monitoring   5. Hyponatremia, improved - Serum sodium is 131 on admission in the setting of hypovolemia  - She was treated in the ED with 1 liter NS and will be continued on NS infusion  - Sodium improved to 136 - Repeat CMP in AM  6. E Coli UTI  - UA is suggestive of infection and suprapubic tenderness noted - WBC went from 11.4 -> 15.0 -> 8.7 -> 6.9 - Prior cultures have grown multiple species  - She has been hospitalized recently and treated with Rocephin at that time for UTI, now residing in SNF, treated empirically with Cefepime and will De-escalate Cefepime to IV Ampicillin - Obtained Blood Cx and Showed NGTD at 1 day - Urine was purulent when Foley was inserted and patient has Flank Tenderness; Urine Cx showed >100,000 CFU of E Coli that was pansensitive - Obtained Kidney Ultrasound and showed No evidence of acute abnormality. No evidence of hydronephrosis. Upper limits of normal renal echogenicity with renal cortical thinning, mild on the right and moderate on the left. - Will D/C Foley in AM and do Voiding Trial   7. CKD stage III  - SCr is 1.25 on admission, up from 1-1.2 last month  - She appears hypovolemic on admission, had been given a liter of NS in ED, and will be continued on NS infusion at 125 mL/hr for 8 hours and then 75 mL/hr - Repeat BUN/Cr this AM was 7/0.90 - Repeat CMP in AM  8. AAA - Stable from scan 1 month ago, followed by vascular surgery and asymptomatic  - Continue outpt vascular  surgery follow-up   9. Orthostatic Hypotension -BP dropped from Sitting to Standing -Given another 1 Liter IVF Bolus yesterday and Today -C/w IVF at 75 mL/hr -Order TED Hose -Repeat Orthostatics this AM  -PT recommending SNF  10. Hypomagnesemia, improved -Patient's Mag level was 1.6 and improved to 2.1 -Replete with IV Mag Sulfate 2 grams yesterday -Continue to Monitor and Replete as Necessary -Repeat CMP in AM   DVT prophylaxis: Heparin 5,000 units sq q8h Code Status: DO NOT RESUSCITATE Family Communication: None Disposition Plan: SNF when medically stable for D/C   Consultants:   None   Procedures: ECHOCARDIOGRAM ------------------------------------------------------------------- Study Conclusions  - Left ventricle: The cavity size was normal. Wall thickness was   normal. Systolic function was normal. The estimated ejection   fraction was in the range of 60% to 65%. Wall motion was normal;   there were no regional wall motion abnormalities. Doppler   parameters are consistent with abnormal left ventricular   relaxation (grade 1  diastolic dysfunction). - Aortic valve: There was no stenosis. - Mitral valve: There was trivial regurgitation. - Right ventricle: The cavity size was normal. Systolic function   was normal. - Pulmonary arteries: No complete TR doppler jet so unable to   estimate PA systolic pressure. - Inferior vena cava: The vessel was normal in size. The   respirophasic diameter changes were in the normal range (>= 50%),   consistent with normal central venous pressure.  Impressions:  - Normal LV size and systolic function, EF 60-65%. Normal RV size   and systolic function. No significant valvular abnormalities.   Antimicrobials:  Anti-infectives    Start     Dose/Rate Route Frequency Ordered Stop   01/31/17 2100  ceFEPIme (MAXIPIME) 1 g in dextrose 5 % 50 mL IVPB     1 g 100 mL/hr over 30 Minutes Intravenous Every 24 hours 01/31/17 0845      01/29/17 2345  ceFEPIme (MAXIPIME) 1 g in dextrose 5 % 50 mL IVPB  Status:  Discontinued     1 g 100 mL/hr over 30 Minutes Intravenous Every 24 hours 01/29/17 2334 01/31/17 0845   01/29/17 2230  cefTRIAXone (ROCEPHIN) 1 g in dextrose 5 % 50 mL IVPB     1 g 100 mL/hr over 30 Minutes Intravenous  Once 01/29/17 2228 01/29/17 2303     Subjective: Seen and examined and states she got lightheaded yesterday and her BP dropped after PT but she was able to get in the chair this AM. No Nausea or Vomiting. Abdominal Pain improved. No CP or SOB. No other complaints or concerns and was feeling better.    Objective: Vitals:   02/01/17 0502 02/01/17 0926 02/01/17 0934 02/01/17 0939  BP: (!) 166/99 122/84 (!) 89/71 101/72  Pulse: 89 91 96 (!) 103  Resp: 19 17    Temp: (!) 97.4 F (36.3 C) 98 F (36.7 C)    TempSrc: Oral Oral    SpO2: 98% 97% 99% 98%  Weight: 51.8 kg (114 lb 4.8 oz)     Height:        Intake/Output Summary (Last 24 hours) at 02/01/17 1314 Last data filed at 02/01/17 1129  Gross per 24 hour  Intake             1530 ml  Output             2200 ml  Net             -670 ml   Filed Weights   01/29/17 1340 01/30/17 0131 02/01/17 0502  Weight: 45.4 kg (100 lb) 50.4 kg (111 lb 1.6 oz) 51.8 kg (114 lb 4.8 oz)   Examination: Physical Exam:  Constitutional: Thin Caucasian female sitting in Chair in NAD appears calm and comfortable Eyes: Sclerae anicteric. Lids normal ENMT: External ears and nose appear normal. Grossly normal hearing Neck: Supple with no JVD Respiratory: CTAB anteriorly. Unlabored breathing. No appreciable wheezing/rales/rhonchi. Cardiovascular: RRR; no m/r/g. No extremity edema Abdomen: Unable to examine due to TSLO Brace GU: Deferred. Foley Catheter in place Musculoskeletal: No contractures; No cyanosis Skin: Warm and dry. No rashes or lesions on a limited skin eval Neurologic: CN 2-12 grossly intact. No appreciable focal deficits Psychiatric: Normal mood and  affect. Intact judgement and insight  Data Reviewed: I have personally reviewed following labs and imaging studies  CBC:  Recent Labs Lab 01/29/17 1343 01/30/17 0421 01/31/17 0417 02/01/17 0432  WBC 11.4* 15.0* 8.7 6.9  NEUTROABS  --  13.0* 5.3 3.5  HGB 11.8* 10.3* 10.4* 10.4*  HCT 36.3 32.1* 32.3* 31.8*  MCV 92.8 92.8 93.6 92.4  PLT 265 253 237 286   Basic Metabolic Panel:  Recent Labs Lab 01/29/17 1343 01/30/17 0421 01/31/17 0417 02/01/17 0432  NA 131* 133* 136 136  K 4.3 3.9 4.2 4.1  CL 98* 104 108 108  CO2 23 20* 20* 22  GLUCOSE 120* 156* 95 100*  BUN CREATININE 1.25* 1.22* 1.03* 0.90  CALCIUM 9.2 8.4* 8.3* 8.5*  MG  --   --  1.6* 2.1  PHOS  --   --  3.1 2.9   GFR: Estimated Creatinine Clearance: 35.8 mL/min (by C-G formula based on SCr of 0.9 mg/dL). Liver Function Tests:  Recent Labs Lab 01/29/17 1341 01/31/17 0417 02/01/17 0432  AST ALT ALKPHOS 107 75 81  BILITOT 0.7 0.3 0.3  PROT 7.3 5.4* 5.8*  ALBUMIN 3.3* 2.5* 2.6*    Recent Labs Lab 01/29/17 1341  LIPASE 30   No results for input(s): AMMONIA in the last 168 hours. Coagulation Profile: No results for input(s): INR, PROTIME in the last 168 hours. Cardiac Enzymes: No results for input(s): CKTOTAL, CKMB, CKMBINDEX, TROPONINI in the last 168 hours. BNP (last 3 results) No results for input(s): PROBNP in the last 8760 hours. HbA1C: No results for input(s): HGBA1C in the last 72 hours. CBG:  Recent Labs Lab 01/30/17 0803 01/31/17 0813 01/31/17 1231 02/01/17 0456 02/01/17 0856  GLUCAP 113* 102* 134* 96 98   Lipid Profile: No results for input(s): CHOL, HDL, LDLCALC, TRIG, CHOLHDL, LDLDIRECT in the last 72 hours. Thyroid Function Tests: No results for input(s): TSH, T4TOTAL, FREET4, T3FREE, THYROIDAB in the last 72 hours. Anemia Panel: No results for input(s): VITAMINB12, FOLATE, FERRITIN, TIBC, IRON, RETICCTPCT in the last 72 hours. Sepsis Labs: No  results for input(s): PROCALCITON, LATICACIDVEN in the last 168 hours.  Recent Results (from the past 240 hour(s))  Urine Culture     Status: Abnormal   Collection Time: 01/29/17  4:05 PM  Result Value Ref Range Status   Specimen Description URINE, RANDOM  Final   Special Requests NONE  Final   Culture >=100,000 COLONIES/mL ESCHERICHIA COLI (A)  Final   Report Status 02/01/2017 FINAL  Final   Organism ID, Bacteria ESCHERICHIA COLI (A)  Final      Susceptibility   Escherichia coli - MIC*    AMPICILLIN <=2 SENSITIVE Sensitive     CEFAZOLIN <=4 SENSITIVE Sensitive     CEFTRIAXONE <=1 SENSITIVE Sensitive     CIPROFLOXACIN <=0.25 SENSITIVE Sensitive     GENTAMICIN <=1 SENSITIVE Sensitive     IMIPENEM <=0.25 SENSITIVE Sensitive     NITROFURANTOIN <=16 SENSITIVE Sensitive     TRIMETH/SULFA <=20 SENSITIVE Sensitive     AMPICILLIN/SULBACTAM <=2 SENSITIVE Sensitive     PIP/TAZO <=4 SENSITIVE Sensitive     Extended ESBL NEGATIVE Sensitive     * >=100,000 COLONIES/mL ESCHERICHIA COLI  MRSA PCR Screening     Status: None   Collection Time: 01/30/17  3:55 AM  Result Value Ref Range Status   MRSA by PCR NEGATIVE NEGATIVE Final    Comment:        The GeneXpert MRSA Assay (FDA approved for NASAL specimens only), is one component of a comprehensive MRSA colonization surveillance program. It is not intended to diagnose MRSA infection nor to guide or monitor treatment for MRSA infections.  Culture, blood (routine x 2)     Status: None (Preliminary result)   Collection Time: 01/30/17  9:01 AM  Result Value Ref Range Status   Specimen Description BLOOD LEFT ANTECUBITAL  Final   Special Requests IN PEDIATRIC BOTTLE Blood Culture adequate volume  Final   Culture NO GROWTH 1 DAY  Final   Report Status PENDING  Incomplete  Culture, blood (routine x 2)     Status: None (Preliminary result)   Collection Time: 01/30/17  9:01 AM  Result Value Ref Range Status   Specimen Description BLOOD BLOOD  LEFT ARM  Final   Special Requests   Final    BOTTLES DRAWN AEROBIC ONLY Blood Culture adequate volume   Culture NO GROWTH 1 DAY  Final   Report Status PENDING  Incomplete    Radiology Studies: US Renal  Result Date: 01/31/2017 CLINICAL DATA:  82 year old female with bilateral flank pain for several months. EXAM: RENAL / URINARY TRACT ULTRASOUND COMPLETE COMPARISON:  01/29/2017 CT FINDINGS: Right Kidney: Length: 9.8 cm. Upper limits of normal renal echogenicity with mild cortical thinning. No evidence of hydronephrosis or solid mass. Left Kidney: Length: 8.6 cm. Upper limits of normal renal echogenicity with moderate cortical thinning. No evidence of hydronephrosis or solid mass. Bladder: Foley catheter within the bladder noted.  No definite abnormalities. IMPRESSION: 1. No evidence of acute abnormality.  No evidence of hydronephrosis. 2. Upper limits of normal renal echogenicity with renal cortical thinning, mild on the right and moderate on the left. Electronically Signed   By: Harmon Pier M.D.   On: 01/31/2017 08:13   Scheduled Meds: . aspirin EC  81 mg Oral Daily  . atorvastatin  40 mg Oral q1800  . fluticasone  1 spray Each Nare Daily  . heparin  5,000 Units Subcutaneous Q8H  . loratadine  10 mg Oral Daily  . metoprolol tartrate  25 mg Oral BID  . OLANZapine  5 mg Oral QHS  . sertraline  25 mg Oral q morning - 10a  . sodium chloride flush  3 mL Intravenous Q12H   Continuous Infusions: . sodium chloride 75 mL/hr at 02/01/17 0859  . ceFEPime (MAXIPIME) IV Stopped (01/31/17 2111)    LOS: 2 days   Merlene Laughter, DO [facility] Pager [phone]  If 7PM-7AM, please contact night-coverage [email or web address] Password [email or web address] 02/01/2017, 1:14 PM

## 2017-02-02 DIAGNOSIS — K5900 Constipation, unspecified: Secondary | ICD-10-CM

## 2017-02-02 LAB — COMPREHENSIVE METABOLIC PANEL
ALBUMIN: 2.5 g/dL — AB (ref 3.5–5.0)
ALK PHOS: 76 U/L (ref 38–126)
ALT: 15 U/L (ref 14–54)
AST: 26 U/L (ref 15–41)
Anion gap: 6 (ref 5–15)
BILIRUBIN TOTAL: 0.5 mg/dL (ref 0.3–1.2)
BUN: 5 mg/dL — AB (ref 6–20)
CALCIUM: 8.3 mg/dL — AB (ref 8.9–10.3)
CO2: 21 mmol/L — AB (ref 22–32)
Chloride: 109 mmol/L (ref 101–111)
Creatinine, Ser: 0.89 mg/dL (ref 0.44–1.00)
GFR calc Af Amer: >60 mL/min (ref >60)
GFR calc non Af Amer: 60 mL/min — ABNORMAL LOW (ref >60)
GLUCOSE: 88 mg/dL (ref 65–99)
Potassium: 3.8 mmol/L (ref 3.5–5.1)
SODIUM: 136 mmol/L (ref 135–145)
TOTAL PROTEIN: 5.4 g/dL — AB (ref 6.5–8.1)

## 2017-02-02 LAB — CBC WITH DIFFERENTIAL/PLATELET
BASOS ABS: 0 10*3/uL (ref 0.0–0.1)
BASOS PCT: 0 %
EOS ABS: 0.3 10*3/uL (ref 0.0–0.7)
Eosinophils Relative: 5 %
HEMATOCRIT: 30.1 % — AB (ref 36.0–46.0)
HEMOGLOBIN: 9.9 g/dL — AB (ref 12.0–15.0)
Lymphocytes Relative: 30 %
Lymphs Abs: 1.9 10*3/uL (ref 0.7–4.0)
MCH: 30.4 pg (ref 26.0–34.0)
MCHC: 32.9 g/dL (ref 30.0–36.0)
MCV: 92.3 fL (ref 78.0–100.0)
Monocytes Absolute: 0.7 10*3/uL (ref 0.1–1.0)
Monocytes Relative: 11 %
NEUTROS ABS: 3.6 10*3/uL (ref 1.7–7.7)
NEUTROS PCT: 54 %
Platelets: 270 10*3/uL (ref 150–400)
RBC: 3.26 MIL/uL — AB (ref 3.87–5.11)
RDW: 12.9 % (ref 11.5–15.5)
WBC: 6.5 10*3/uL (ref 4.0–10.5)

## 2017-02-02 LAB — PHOSPHORUS: Phosphorus: 2.9 mg/dL (ref 2.5–4.6)

## 2017-02-02 LAB — GLUCOSE, CAPILLARY: GLUCOSE-CAPILLARY: 97 mg/dL (ref 65–99)

## 2017-02-02 LAB — MAGNESIUM: Magnesium: 1.7 mg/dL (ref 1.7–2.4)

## 2017-02-02 MED ORDER — SENNOSIDES-DOCUSATE SODIUM 8.6-50 MG PO TABS
1.0000 | ORAL_TABLET | Freq: Two times a day (BID) | ORAL | Status: DC
  Administered 2017-02-02 (×2): 1 via ORAL
  Filled 2017-02-02 (×3): qty 1

## 2017-02-02 MED ORDER — SODIUM CHLORIDE 0.9 % IV BOLUS (SEPSIS)
1000.0000 mL | Freq: Once | INTRAVENOUS | Status: AC
  Administered 2017-02-02: 1000 mL via INTRAVENOUS

## 2017-02-02 MED ORDER — BISACODYL 10 MG RE SUPP
10.0000 mg | Freq: Once | RECTAL | Status: DC
  Filled 2017-02-02: qty 1

## 2017-02-02 MED ORDER — POLYETHYLENE GLYCOL 3350 17 G PO PACK
17.0000 g | PACK | Freq: Two times a day (BID) | ORAL | Status: DC
  Administered 2017-02-02 – 2017-02-03 (×3): 17 g via ORAL
  Filled 2017-02-02 (×3): qty 1

## 2017-02-02 NOTE — [place] FL2 (Deleted)
[place] MEDICAID FL2 LEVEL OF CARE SCREENING TOOL     IDENTIFICATION  Patient Name: Tonya Payne Birthdate: 09/16/1934 Sex: female Admission Date (Current Location): 01/29/2017  [place] and [place] Number:  Chiropodist and Address:  [facility], [address], [place], [place] [address]      Provider Number: [id number]  Attending Physician Name and Address:  Merlene Laughter, DO  Relative Name and Phone Number:  Golomb,Timothy D. Son   [phone] or Otero-Wright,Valie Relative [phone]  [phone]     Current Level of Care: Hospital Recommended Level of Care: Skilled Nursing Facility Prior Approval Number:    Date Approved/Denied:   PASRR Number: [id number]  Discharge Plan: SNF    Current Diagnoses: Patient Active Problem List   Diagnosis Date Noted  . Hypomagnesemia 01/31/2017  . Orthostatic hypotension 01/31/2017  . Syncope 01/29/2017  . Nausea vomiting and diarrhea 01/29/2017  . Hypertensive urgency 01/29/2017  . COPD (chronic obstructive pulmonary disease) (HCC) 12/30/2016  . Closed compression fracture of thoracic vertebra (HCC) 12/30/2016  . Abdominal pain 12/30/2016  . Alcohol use 12/30/2016  . CAD (coronary artery disease) 12/30/2016  . Scalp laceration   . Acute renal failure superimposed on stage 3 chronic kidney disease (HCC)   . Dehydration   . Physical deconditioning   . Pressure injury of skin 11/12/2016  . CKD (chronic kidney disease), stage III 09/24/2016  . Closed left hip fracture, with routine healing, subsequent encounter 09/23/2016  . Hyponatremia 09/23/2016  . Hypertension 09/23/2016  . Left displaced femoral neck fracture (HCC)   . Closed fracture of multiple pubic rami, right, sequela 02/27/2016  . Displaced fracture of neck of right second metacarpal bone with routine healing 02/27/2016  . Protein-calorie malnutrition, severe (HCC) 02/22/2016  . Orthostatic dizziness 02/22/2016  .  Postoperative atrial fibrillation (HCC) 02/22/2016  . Fall   . Hand fracture   . Pelvic fracture (HCC) 01/26/2016  . AAA (abdominal aortic aneurysm) (HCC) 11/30/2015  . Post PTCA   . S/P CABG x 3 10/16/2015  . Coronary artery disease involving native coronary artery with angina pectoris (HCC) 10/08/2015  . Congestive dilated cardiomyopathy (HCC) 10/08/2015  . Hyperlipidemia LDL goal <70 10/08/2015  . E. coli UTI (urinary tract infection) 10/06/2015  . Myocardial infarction involving left anterior descending (LAD) coronary artery (HCC) 10/06/2015    Orientation RESPIRATION BLADDER Height & Weight     Self, Time, Situation, Place  Normal Continent Weight: 119 lb 9.6 oz (54.3 kg) Height:  5' (152.4 cm)  BEHAVIORAL SYMPTOMS/MOOD NEUROLOGICAL BOWEL NUTRITION STATUS      Continent Diet (Cardiac)  AMBULATORY STATUS COMMUNICATION OF NEEDS Skin   Limited Assist Verbally PU Stage and Appropriate Care PU Stage 1 Dressing:  (Every 3 days)                     Personal Care Assistance Level of Assistance  Bathing, Feeding, Dressing Bathing Assistance: Limited assistance Feeding assistance: Independent Dressing Assistance: Limited assistance     Functional Limitations Info  Sight, Hearing, Speech Sight Info: Adequate Hearing Info: Adequate Speech Info: Adequate    SPECIAL CARE FACTORS FREQUENCY  PT (By licensed PT)     PT Frequency: 5x a week              Contractures Contractures Info: Not present    Additional Factors Info  Code Status, Allergies, Psychotropic Code Status Info: DNR Allergies Info: NKA Psychotropic Info: OLANZapine (ZYPREXA) tablet 5 mg and  sertraline (ZOLOFT) tablet 25 mg         Current Medications (02/02/2017):  This is the current hospital active medication list Current Facility-Administered Medications  Medication Dose Route Frequency Provider Last Rate Last Dose  . 0.9 %  sodium chloride infusion   Intravenous Continuous Marguerita Merles [place],  DO 75 mL/hr at 02/02/17 0302    . acetaminophen (TYLENOL) tablet 650 mg  650 mg Oral Q6H PRN Opyd, Lavone Neri, MD       Or  . acetaminophen (TYLENOL) suppository 650 mg  650 mg Rectal Q6H PRN Opyd, Lavone Neri, MD      . ampicillin (OMNIPEN) 2 g in sodium chloride 0.9 % 50 mL IVPB  2 g Intravenous [address] [place], DO 150 mL/hr at 02/02/17 0543 2 g at 02/02/17 0543  . aspirin EC tablet 81 mg  81 mg Oral Daily Opyd, Lavone Neri, MD   81 mg at 02/02/17 1009  . atorvastatin (LIPITOR) tablet 40 mg  40 mg Oral q1800 Opyd, Lavone Neri, MD   40 mg at 02/01/17 1801  . bisacodyl (DULCOLAX) suppository 10 mg  10 mg Rectal Once Sheikh, Omair Latif, DO      . fluticasone (FLONASE) 50 MCG/ACT nasal spray 1 spray  1 spray Each Nare Daily Opyd, Lavone Neri, MD   1 spray at 02/02/17 1009  . heparin injection 5,000 Units  5,000 Units Subcutaneous Q8H Opyd, Lavone Neri, MD   5,000 Units at 02/02/17 0543  . loratadine (CLARITIN) tablet 10 mg  10 mg Oral Daily Opyd, Lavone Neri, MD   10 mg at 02/02/17 1009  . methocarbamol (ROBAXIN) tablet 500 mg  500 mg Oral Q8H PRN Opyd, Lavone Neri, MD      . metoprolol tartrate (LOPRESSOR) tablet 25 mg  25 mg Oral BID Opyd, Lavone Neri, MD   25 mg at 02/02/17 1009  . OLANZapine (ZYPREXA) tablet 5 mg  5 mg Oral QHS Opyd, Lavone Neri, MD   5 mg at 02/01/17 2120  . ondansetron (ZOFRAN) tablet 4 mg  4 mg Oral Q6H PRN Opyd, Lavone Neri, MD       Or  . ondansetron (ZOFRAN) injection 4 mg  4 mg Intravenous Q6H PRN Opyd, Lavone Neri, MD      . oxyCODONE (Oxy IR/ROXICODONE) immediate release tablet 5 mg  5 mg Oral Q4H PRN Opyd, Lavone Neri, MD   5 mg at 02/01/17 1502  . polyethylene glycol (MIRALAX / GLYCOLAX) packet 17 g  17 g Oral BID Sheikh, Omair Latif, DO      . senna-docusate (Senokot-S) tablet 1 tablet  1 tablet Oral BID Sheikh, Omair Latif, DO      . sertraline (ZOLOFT) tablet 25 mg  25 mg Oral q morning - 10a Opyd, Lavone Neri, MD   25 mg at 02/01/17 0856  . sodium chloride 0.9 % bolus 1,000 mL   1,000 mL Intravenous Once [facility], Omair Latif, DO      . sodium chloride flush (NS) 0.9 % injection 3 mL  3 mL Intravenous Q12H Opyd, Lavone Neri, MD   3 mL at 01/31/17 2042  . traMADol (ULTRAM) tablet 50 mg  50 mg Oral Q6H PRN Opyd, Lavone Neri, MD   50 mg at 02/02/17 1009   Facility-Administered Medications Ordered in Other Encounters  Medication Dose Route Frequency Provider Last Rate Last Dose  . nitroGLYCERIN 1 mg/10 ml (100 mcg/ml) - IR/CATH LAB    PRN Marykay Lex, MD  200 mcg at 10/06/15 1525     Discharge Medications: Please see discharge summary for a list of discharge medications.  Relevant Imaging Results:  Relevant Lab Results:   Additional Information SS#: [id number].  Chrys Landgrebe, Ervin Knack, LCSWA

## 2017-02-02 NOTE — Progress Notes (Signed)
PROGRESS NOTE  Tonya Payne  ZOX:[id number] DOB: Nov 24, 1934 DOA: 01/29/2017 PCP: Rocky Morel, MD   Brief Narrative:  Tonya Payne is a 82 y.o. female with medical history significant for CAD status post CABG, AAA, chronic kidney disease stage III, and admission last month for fall with acute thoracic compression fracture, discharged to SNF for rehabilitation, and now presenting after a syncopal episode. Patient reports that she had initially been doing well at the rehabilitation facility, but reports developing nausea with vomiting and non-bloody diarrhea over the past 2 days. She reports lightheadedness upon standing associated with this, reports that she has suffered a loss of consciousness multiple times over the last 2 days, typically upon standing. This afternoon, she was reportedly exercising on a recumbent bike at the rehabilitation facility, stood up, and suffered a brief loss of consciousness without hitting her head. EMS was called for transport to the hospital. She denies fevers or chills, denies chest pain, denies shortness of breath or cough, and denies abdominal pain. She does not know of any other residents at her facility with similar GI illness. Prior to the last 2 days, she does not recall ever having a syncopal episode before.   Upon arrival to the ED, patient was found to be afebrile, saturating well on room air, and with vital signs otherwise stable. Patient later developed acute vomiting with jump in her heart rate to 130s and marked elevation in blood pressure. CTA of the chest abdomen and pelvis is obtained and negative for acute pathology, but notable for stable infrarenal fusiform AAA without appreciable change since the CT performed one month ago. Patient was treated with empiric Rocephin for suspected UTI, given a dose of 10 mg labetalol, had systolic blood pressure drop into the 70s, and was treated with a liter of normal saline. Abx were changed to Cefepime and patient was  admitted for Syncope likley from N/V/D and Suspected recurrent UTI. Urine Grew out >100,000 CFU of E Coli and will de-escalate Abx as Sensitives were Pansensitive and will change to Ampicillin. Patient felt better yesterday but when working with PT became Orthostatic again and could not progress. Will repeat Orthostatics today and bolus another Liter. Will remove Foley and provide a bowel regimen as patient has not had a BM in over a week.   Assessment & Plan:   Principal Problem:   Syncope Active Problems:   E. coli UTI (urinary tract infection)   AAA (abdominal aortic aneurysm) (HCC)   Hyponatremia   Hypertension   CKD (chronic kidney disease), stage III   COPD (chronic obstructive pulmonary disease) (HCC)   CAD (coronary artery disease)   Nausea vomiting and diarrhea   Hypertensive urgency   Hypomagnesemia   Orthostatic hypotension  1. Syncope likely from Dehydration and Orthostasis - Pt presented following a syncopal episode at her SNF  - Reports multiple such episodes over past 2 days, states that it has been upon standing  - She is too symptomatic in ED for orthostatic vitals; Done yesterday and per nursing patient dropped - With recent N/V/D and apparent hypovolemia, suspect this is orthostatic and confirmed - Plan to continue cardiac monitoring, continue IVF hydration with NS at 75 mL/hr - Will give another 1 Liter Bolus; Has had 1 liter for the last 2 days - ECHOCARDIOGRAM Done and showed Normal LV size and systolic function, EF 60-65%. Normal RV size and systolic function. No significant valvular abnormalities - Strict I's and O's; Patient is +281.7 - Repeat Orthostatics yesterday not recorded  but nursing stated she dropped; Will repeat today  2. Nausea, Vomiting, Diarrhea, improved - Pt reports N/V/D for past 2 days, does not know of anyone else at the SNF with similar sxs  - Denies blood in vomitus or stool, and denies abd pain   - Recently treated with Rocephin for UTI    - Continue IVF hydration at 75 mL/hr, prn antiemetics with Zofran 4 mg po/IV, send stool for GI panel and C diff if diarrhea recurs  - No recurrent Diarrhea today  - Was still orthostatic yesterday; Will repeat Orthostatics this AM  3. Hypertension  - BP initially wnl in ED, became very high in setting of active vomiting, was treated with 10 mg IV labetalol, and then dropped SBP to mid-70's -Currently on lower Side  - Plan to continue IVF hydration with NS at 75 mL/hr for now, continue Metoprolol 25 mg po BID with holding parameters    4. CAD   - No anginal complaints  - Troponin undetectable x2 in ED several hrs apart  - Continue cardiac monitoring, continue Atorvastatin 40 mg po qHS, ASA 81 mg, and Metoprolol 25 mg po BID   - C/w Telemetry Monitoring   5. Hyponatremia, improved - Serum sodium is 131 on admission in the setting of hypovolemia  - She was treated in the ED with 1 liter NS and will be continued on NS infusion  - Sodium improved to 136 - Repeat CMP in AM  6. E Coli UTI  - UA is suggestive of infection and suprapubic tenderness noted - WBC went from 11.4 -> 15.0 -> 8.7 -> 6.9 -> 6.5 - Prior cultures have grown multiple species  - She has been hospitalized recently and treated with Rocephin at that time for UTI, now residing in SNF, treated empirically with Cefepime and will De-escalate Cefepime to IV Ampicillin - Obtained Blood Cx and Showed NGTD at 2 day - Urine was purulent when Foley was inserted and patient has Flank Tenderness; Urine Cx showed >100,000 CFU of E Coli that was pansensitive - Obtained Kidney Ultrasound and showed No evidence of acute abnormality. No evidence of hydronephrosis. Upper limits of normal renal echogenicity with renal cortical thinning, mild on the right and moderate on the left. - Will D/C Foley today and do Voiding Trial   7. CKD stage III  - SCr is 1.25 on admission, up from 1-1.2 last month  - S/p 4 Liter boluses; C/w NS at 75  mL/hr - Repeat BUN/Cr this AM was 5/0.89 - Repeat CMP in AM  8. AAA - Stable from scan 1 month ago, followed by vascular surgery and asymptomatic  - Continue outpt vascular surgery follow-up   9. Orthostatic Hypotension -BP dropped from Sitting to Standing -Given 1 Liter Daily for the Last 2 days and will give another Liter this AM -C/w IVF at 75 mL/hr -Ordered TED Hose but patient was not able to tolerate them  -Repeat Orthostatics afternoon AM  -PT recommending SNF  10. Hypomagnesemia, improved -Patient's Mag level was 1.7 this AM -Replete with IV Mag Sulfate 2 grams yesterday -Continue to Monitor and Replete as Necessary -Repeat CMP in AM   11. Constipation -Started Senna-Docusate 1 tab po BID, Miralax 17 grams po BID, and given Bisacodyl 10 mg RC Suppository   DVT prophylaxis: Heparin 5,000 units sq q8h Code Status: DO NOT RESUSCITATE Family Communication: None Disposition Plan: SNF when medically stable for D/C   Consultants:   None   Procedures: ECHOCARDIOGRAM -------------------------------------------------------------------  Study Conclusions  - Left ventricle: The cavity size was normal. Wall thickness was   normal. Systolic function was normal. The estimated ejection   fraction was in the range of 60% to 65%. Wall motion was normal;   there were no regional wall motion abnormalities. Doppler   parameters are consistent with abnormal left ventricular   relaxation (grade 1 diastolic dysfunction). - Aortic valve: There was no stenosis. - Mitral valve: There was trivial regurgitation. - Right ventricle: The cavity size was normal. Systolic function   was normal. - Pulmonary arteries: No complete TR doppler jet so unable to   estimate PA systolic pressure. - Inferior vena cava: The vessel was normal in size. The   respirophasic diameter changes were in the normal range (>= 50%),   consistent with normal central venous pressure.  Impressions:  -  Normal LV size and systolic function, EF 60-65%. Normal RV size   and systolic function. No significant valvular abnormalities.   Antimicrobials:  Anti-infectives    Start     Dose/Rate Route Frequency Ordered Stop   02/01/17 2200  ampicillin (OMNIPEN) 2 g in sodium chloride 0.9 % 50 mL IVPB     2 g 150 mL/hr over 20 Minutes Intravenous Every 8 hours 02/01/17 1329     01/31/17 2100  ceFEPIme (MAXIPIME) 1 g in dextrose 5 % 50 mL IVPB  Status:  Discontinued     1 g 100 mL/hr over 30 Minutes Intravenous Every 24 hours 01/31/17 0845 02/01/17 1329   01/29/17 2345  ceFEPIme (MAXIPIME) 1 g in dextrose 5 % 50 mL IVPB  Status:  Discontinued     1 g 100 mL/hr over 30 Minutes Intravenous Every 24 hours 01/29/17 2334 01/31/17 0845   01/29/17 2230  cefTRIAXone (ROCEPHIN) 1 g in dextrose 5 % 50 mL IVPB     1 g 100 mL/hr over 30 Minutes Intravenous  Once 01/29/17 2228 01/29/17 2303     Subjective: Seen and examined and per nursing patient was orthostatic yesterday. Patient was feeling well this AM but was complaining of some abdominal pain and stated she has not had a bowel movement for almost a week. No other complaints or concerns at this time.     Objective: Vitals:   02/01/17 2134 02/02/17 0300 02/02/17 0532 02/02/17 1007  BP: (!) 151/79 137/75 (!) 148/78 133/69  Pulse: 81 (!) 108 74 97  Resp: 18 18    Temp: 98.6 F (37 C) 97.7 F (36.5 C) (!) 97.3 F (36.3 C)   TempSrc: Oral Oral Oral   SpO2: 96% 95% 95%   Weight:   54.3 kg (119 lb 9.6 oz)   Height:        Intake/Output Summary (Last 24 hours) at 02/02/17 1144 Last data filed at 02/02/17 0659  Gross per 24 hour  Intake           1942.5 ml  Output             2550 ml  Net           -607.5 ml   Filed Weights   01/30/17 0131 02/01/17 0502 02/02/17 0532  Weight: 50.4 kg (111 lb 1.6 oz) 51.8 kg (114 lb 4.8 oz) 54.3 kg (119 lb 9.6 oz)   Examination: Physical Exam:  Constitutional: Thin Caucasian female sitting up in Bed eating  breakfast. Appears calm and comfortable. Eyes: Sclerae anicteric; Lids normal ENMT: External Ears and nose appear normal. Grossly normal hearing. MMM Neck: Supple  with no JVD Respiratory: CTAB; No appreciable wheezing/rales/rhonchi and patient has unlabored breathing Cardiovascular: RRR; No m/r/g. No extremity edema Abdomen: Soft, Mildly tender, Bowel sounds present GU: Deferred; Foley Catheter in place Musculoskeletal: No contractures; No cyanosis Skin: Warm and dry. No rashes or lesions on a limited skin eval Neurologic: CN 2-12 grossly intact. No appreciable focal deficits Psychiatric: Normal mood and affect. Intact judgement and insight. Awake and Alert x 3  Data Reviewed: I have personally reviewed following labs and imaging studies  CBC:  Recent Labs Lab 01/29/17 1343 01/30/17 0421 01/31/17 0417 02/01/17 0432 02/02/17 0455  WBC 11.4* 15.0* 8.7 6.9 6.5  NEUTROABS  --  13.0* 5.3 3.5 3.6  HGB 11.8* 10.3* 10.4* 10.4* 9.9*  HCT 36.3 32.1* 32.3* 31.8* 30.1*  MCV 92.8 92.8 93.6 92.4 92.3  PLT 265 253 237 286 270   Basic Metabolic Panel:  Recent Labs Lab 01/29/17 1343 01/30/17 0421 01/31/17 0417 02/01/17 0432 02/02/17 0455  NA 131* 133* 136 136 136  K 4.3 3.9 4.2 4.1 3.8  CL 98* 104 108 108 109  CO2 23 20* 20* 22 21*  GLUCOSE 120* 156* 95 100* 88  BUN 5*  CREATININE 1.25* 1.22* 1.03* 0.90 0.89  CALCIUM 9.2 8.4* 8.3* 8.5* 8.3*  MG  --   --  1.6* 2.1 1.7  PHOS  --   --  3.1 2.9 2.9   GFR: Estimated Creatinine Clearance: 36.2 mL/min (by C-G formula based on SCr of 0.89 mg/dL). Liver Function Tests:  Recent Labs Lab 01/29/17 1341 01/31/17 0417 02/01/17 0432 02/02/17 0455  AST ALT ALKPHOS 107 75 81 76  BILITOT 0.7 0.3 0.3 0.5  PROT 7.3 5.4* 5.8* 5.4*  ALBUMIN 3.3* 2.5* 2.6* 2.5*    Recent Labs Lab 01/29/17 1341  LIPASE 30   No results for input(s): AMMONIA in the last 168 hours. Coagulation Profile: No results for  input(s): INR, PROTIME in the last 168 hours. Cardiac Enzymes: No results for input(s): CKTOTAL, CKMB, CKMBINDEX, TROPONINI in the last 168 hours. BNP (last 3 results) No results for input(s): PROBNP in the last 8760 hours. HbA1C: No results for input(s): HGBA1C in the last 72 hours. CBG:  Recent Labs Lab 01/31/17 0813 01/31/17 1231 02/01/17 0456 02/01/17 0856 02/02/17 0818  GLUCAP 102* 134* 96 98 97   Lipid Profile: No results for input(s): CHOL, HDL, LDLCALC, TRIG, CHOLHDL, LDLDIRECT in the last 72 hours. Thyroid Function Tests: No results for input(s): TSH, T4TOTAL, FREET4, T3FREE, THYROIDAB in the last 72 hours. Anemia Panel: No results for input(s): VITAMINB12, FOLATE, FERRITIN, TIBC, IRON, RETICCTPCT in the last 72 hours. Sepsis Labs: No results for input(s): PROCALCITON, LATICACIDVEN in the last 168 hours.  Recent Results (from the past 240 hour(s))  Urine Culture     Status: Abnormal   Collection Time: 01/29/17  4:05 PM  Result Value Ref Range Status   Specimen Description URINE, RANDOM  Final   Special Requests NONE  Final   Culture >=100,000 COLONIES/mL ESCHERICHIA COLI (A)  Final   Report Status 02/01/2017 FINAL  Final   Organism ID, Bacteria ESCHERICHIA COLI (A)  Final      Susceptibility   Escherichia coli - MIC*    AMPICILLIN <=2 SENSITIVE Sensitive     CEFAZOLIN <=4 SENSITIVE Sensitive     CEFTRIAXONE <=1 SENSITIVE Sensitive     CIPROFLOXACIN <=0.25 SENSITIVE Sensitive     GENTAMICIN <=1 SENSITIVE  Sensitive     IMIPENEM <=0.25 SENSITIVE Sensitive     NITROFURANTOIN <=16 SENSITIVE Sensitive     TRIMETH/SULFA <=20 SENSITIVE Sensitive     AMPICILLIN/SULBACTAM <=2 SENSITIVE Sensitive     PIP/TAZO <=4 SENSITIVE Sensitive     Extended ESBL NEGATIVE Sensitive     * >=100,000 COLONIES/mL ESCHERICHIA COLI  MRSA PCR Screening     Status: None   Collection Time: 01/30/17  3:55 AM  Result Value Ref Range Status   MRSA by PCR NEGATIVE NEGATIVE Final    Comment:         The GeneXpert MRSA Assay (FDA approved for NASAL specimens only), is one component of a comprehensive MRSA colonization surveillance program. It is not intended to diagnose MRSA infection nor to guide or monitor treatment for MRSA infections.   Culture, blood (routine x 2)     Status: None (Preliminary result)   Collection Time: 01/30/17  9:01 AM  Result Value Ref Range Status   Specimen Description BLOOD LEFT ANTECUBITAL  Final   Special Requests IN PEDIATRIC BOTTLE Blood Culture adequate volume  Final   Culture NO GROWTH 2 DAYS  Final   Report Status PENDING  Incomplete  Culture, blood (routine x 2)     Status: None (Preliminary result)   Collection Time: 01/30/17  9:01 AM  Result Value Ref Range Status   Specimen Description BLOOD BLOOD LEFT ARM  Final   Special Requests   Final    BOTTLES DRAWN AEROBIC ONLY Blood Culture adequate volume   Culture NO GROWTH 2 DAYS  Final   Report Status PENDING  Incomplete    Radiology Studies: No results found. Scheduled Meds: . aspirin EC  81 mg Oral Daily  . atorvastatin  40 mg Oral q1800  . bisacodyl  10 mg Rectal Once  . fluticasone  1 spray Each Nare Daily  . heparin  5,000 Units Subcutaneous Q8H  . loratadine  10 mg Oral Daily  . metoprolol tartrate  25 mg Oral BID  . OLANZapine  5 mg Oral QHS  . polyethylene glycol  17 g Oral BID  . senna-docusate  1 tablet Oral BID  . sertraline  25 mg Oral q morning - 10a  . sodium chloride flush  3 mL Intravenous Q12H   Continuous Infusions: . sodium chloride Stopped (02/02/17 1136)  . ampicillin (OMNIPEN) IV 2 g (02/02/17 0543)    LOS: 3 days   Merlene Laughter, DO [facility] Pager [phone]  If 7PM-7AM, please contact night-coverage [email or web address] Password [email or web address] 02/02/2017, 11:44 AM

## 2017-02-02 NOTE — [place] FL2 (Signed)
[place] MEDICAID FL2 LEVEL OF CARE SCREENING TOOL     IDENTIFICATION  Patient Name: Tonya Payne Birthdate: 1934-05-21 Sex: female Admission Date (Current Location): 01/29/2017  [facility] and [place] Number:  Producer, television/film/video and Address:  [facility] [place], [address] [place], [place] [address]      Provider Number: [id number]  Attending Physician Name and Address:  Merlene Laughter, DO  Relative Name and Phone Number:  Sitzer,Timothy D. Son   [phone] or Soucek-Wright,Valie Relative [phone]  [phone]     Current Level of Care: Hospital Recommended Level of Care: Skilled Nursing Facility Prior Approval Number:    Date Approved/Denied:   PASRR Number: [id number]  Discharge Plan: SNF    Current Diagnoses: Patient Active Problem List   Diagnosis Date Noted  . Constipation 02/02/2017  . Hypomagnesemia 01/31/2017  . Orthostatic hypotension 01/31/2017  . Syncope 01/29/2017  . Nausea vomiting and diarrhea 01/29/2017  . Hypertensive urgency 01/29/2017  . COPD (chronic obstructive pulmonary disease) (HCC) 12/30/2016  . Closed compression fracture of thoracic vertebra (HCC) 12/30/2016  . Abdominal pain 12/30/2016  . Alcohol use 12/30/2016  . CAD (coronary artery disease) 12/30/2016  . Scalp laceration   . Acute renal failure superimposed on stage 3 chronic kidney disease (HCC)   . Dehydration   . Physical deconditioning   . Pressure injury of skin 11/12/2016  . CKD (chronic kidney disease), stage III 09/24/2016  . Closed left hip fracture, with routine healing, subsequent encounter 09/23/2016  . Hyponatremia 09/23/2016  . Hypertension 09/23/2016  . Left displaced femoral neck fracture (HCC)   . Closed fracture of multiple pubic rami, right, sequela 02/27/2016  . Displaced fracture of neck of right second metacarpal bone with routine healing 02/27/2016  . Protein-calorie malnutrition, severe (HCC) 02/22/2016  . Orthostatic  dizziness 02/22/2016  . Postoperative atrial fibrillation (HCC) 02/22/2016  . Fall   . Hand fracture   . Pelvic fracture (HCC) 01/26/2016  . AAA (abdominal aortic aneurysm) (HCC) 11/30/2015  . Post PTCA   . S/P CABG x 3 10/16/2015  . Coronary artery disease involving native coronary artery with angina pectoris (HCC) 10/08/2015  . Congestive dilated cardiomyopathy (HCC) 10/08/2015  . Hyperlipidemia LDL goal <70 10/08/2015  . E. coli UTI (urinary tract infection) 10/06/2015  . Myocardial infarction involving left anterior descending (LAD) coronary artery (HCC) 10/06/2015    Orientation RESPIRATION BLADDER Height & Weight     Time, Situation, Place, Self  Normal Continent Weight: 119 lb 9.6 oz (54.3 kg) Height:  5' (152.4 cm)  BEHAVIORAL SYMPTOMS/MOOD NEUROLOGICAL BOWEL NUTRITION STATUS      Continent Diet (Cardiac)  AMBULATORY STATUS COMMUNICATION OF NEEDS Skin   Limited Assist Verbally PU Stage and Appropriate Care PU Stage 1 Dressing:  (Every 3 days)                     Personal Care Assistance Level of Assistance  Bathing, Feeding, Dressing Bathing Assistance: Limited assistance Feeding assistance: Independent Dressing Assistance: Limited assistance     Functional Limitations Info  Sight, Hearing, Speech Sight Info: Adequate Hearing Info: Adequate Speech Info: Adequate    SPECIAL CARE FACTORS FREQUENCY  PT (By licensed PT)     PT Frequency: 5x a week              Contractures Contractures Info: Not present    Additional Factors Info  Code Status, Allergies, Psychotropic Code Status Info: DNR Allergies Info: NKA Psychotropic Info:  Olanzapine (Zyprexa) tablet 5 mg and sertraline (zoloft) tablet 25 mg         Current Medications (02/02/2017):  This is the current hospital active medication list Current Facility-Administered Medications  Medication Dose Route Frequency Provider Last Rate Last Dose  . 0.9 %  sodium chloride infusion   Intravenous  Continuous Marguerita Merles Winthrop, DO   Stopped at 02/02/17 1136  . acetaminophen (TYLENOL) tablet 650 mg  650 mg Oral Q6H PRN Opyd, Lavone Neri, MD       Or  . acetaminophen (TYLENOL) suppository 650 mg  650 mg Rectal Q6H PRN Opyd, Lavone Neri, MD      . ampicillin (OMNIPEN) 2 g in sodium chloride 0.9 % 50 mL IVPB  2 g Intravenous [address] [place], DO 150 mL/hr at 02/02/17 0543 2 g at 02/02/17 0543  . aspirin EC tablet 81 mg  81 mg Oral Daily Opyd, Lavone Neri, MD   81 mg at 02/02/17 1009  . atorvastatin (LIPITOR) tablet 40 mg  40 mg Oral q1800 Opyd, Lavone Neri, MD   40 mg at 02/01/17 1801  . bisacodyl (DULCOLAX) suppository 10 mg  10 mg Rectal Once Sheikh, Omair Latif, DO      . fluticasone (FLONASE) 50 MCG/ACT nasal spray 1 spray  1 spray Each Nare Daily Opyd, Lavone Neri, MD   1 spray at 02/02/17 1009  . heparin injection 5,000 Units  5,000 Units Subcutaneous Q8H Opyd, Lavone Neri, MD   5,000 Units at 02/02/17 0543  . loratadine (CLARITIN) tablet 10 mg  10 mg Oral Daily Opyd, Lavone Neri, MD   10 mg at 02/02/17 1009  . methocarbamol (ROBAXIN) tablet 500 mg  500 mg Oral Q8H PRN Opyd, Lavone Neri, MD      . metoprolol tartrate (LOPRESSOR) tablet 25 mg  25 mg Oral BID Opyd, Lavone Neri, MD   25 mg at 02/02/17 1009  . OLANZapine (ZYPREXA) tablet 5 mg  5 mg Oral QHS Opyd, Lavone Neri, MD   5 mg at 02/01/17 2120  . ondansetron (ZOFRAN) tablet 4 mg  4 mg Oral Q6H PRN Opyd, Lavone Neri, MD       Or  . ondansetron (ZOFRAN) injection 4 mg  4 mg Intravenous Q6H PRN Opyd, Lavone Neri, MD      . oxyCODONE (Oxy IR/ROXICODONE) immediate release tablet 5 mg  5 mg Oral Q4H PRN Opyd, Lavone Neri, MD   5 mg at 02/01/17 1502  . polyethylene glycol (MIRALAX / GLYCOLAX) packet 17 g  17 g Oral BID Sheikh, Omair Latif, DO      . senna-docusate (Senokot-S) tablet 1 tablet  1 tablet Oral BID Sheikh, Omair Latif, DO      . sertraline (ZOLOFT) tablet 25 mg  25 mg Oral q morning - 10a Opyd, Lavone Neri, MD   25 mg at 02/01/17 0856  . sodium  chloride flush (NS) 0.9 % injection 3 mL  3 mL Intravenous Q12H Opyd, Lavone Neri, MD   3 mL at 01/31/17 2042  . traMADol (ULTRAM) tablet 50 mg  50 mg Oral Q6H PRN Opyd, Lavone Neri, MD   50 mg at 02/02/17 1009   Facility-Administered Medications Ordered in Other Encounters  Medication Dose Route Frequency Provider Last Rate Last Dose  . nitroGLYCERIN 1 mg/10 ml (100 mcg/ml) - IR/CATH LAB    PRN Marykay Lex, MD   200 mcg at 10/06/15 1525     Discharge Medications: Please see discharge summary for a  list of discharge medications.  Relevant Imaging Results:  Relevant Lab Results:   Additional Information SS#: [id number].  Hebe Merriwether, Ervin Knack, LCSWA

## 2017-02-03 ENCOUNTER — Encounter (HOSPITAL_COMMUNITY): Payer: Self-pay | Admitting: General Practice

## 2017-02-03 LAB — CBC WITH DIFFERENTIAL/PLATELET
BASOS ABS: 0 10*3/uL (ref 0.0–0.1)
BASOS PCT: 1 %
EOS PCT: 4 %
Eosinophils Absolute: 0.3 10*3/uL (ref 0.0–0.7)
HCT: 31.9 % — ABNORMAL LOW (ref 36.0–46.0)
Hemoglobin: 10.7 g/dL — ABNORMAL LOW (ref 12.0–15.0)
Lymphocytes Relative: 30 %
Lymphs Abs: 1.9 10*3/uL (ref 0.7–4.0)
MCH: 30.7 pg (ref 26.0–34.0)
MCHC: 33.5 g/dL (ref 30.0–36.0)
MCV: 91.7 fL (ref 78.0–100.0)
MONO ABS: 0.5 10*3/uL (ref 0.1–1.0)
Monocytes Relative: 8 %
NEUTROS ABS: 3.7 10*3/uL (ref 1.7–7.7)
Neutrophils Relative %: 57 %
PLATELETS: 294 10*3/uL (ref 150–400)
RBC: 3.48 MIL/uL — ABNORMAL LOW (ref 3.87–5.11)
RDW: 13 % (ref 11.5–15.5)
WBC: 6.5 10*3/uL (ref 4.0–10.5)

## 2017-02-03 LAB — COMPREHENSIVE METABOLIC PANEL
ALBUMIN: 2.6 g/dL — AB (ref 3.5–5.0)
ALT: 15 U/L (ref 14–54)
AST: 21 U/L (ref 15–41)
Alkaline Phosphatase: 76 U/L (ref 38–126)
Anion gap: 5 (ref 5–15)
CHLORIDE: 109 mmol/L (ref 101–111)
CO2: 23 mmol/L (ref 22–32)
Calcium: 8.5 mg/dL — ABNORMAL LOW (ref 8.9–10.3)
Creatinine, Ser: 0.81 mg/dL (ref 0.44–1.00)
GFR calc Af Amer: >60 mL/min (ref >60)
GFR calc non Af Amer: >60 mL/min (ref >60)
GLUCOSE: 100 mg/dL — AB (ref 65–99)
POTASSIUM: 3.7 mmol/L (ref 3.5–5.1)
SODIUM: 137 mmol/L (ref 135–145)
Total Bilirubin: 0.6 mg/dL (ref 0.3–1.2)
Total Protein: 5.8 g/dL — ABNORMAL LOW (ref 6.5–8.1)

## 2017-02-03 LAB — GLUCOSE, CAPILLARY: Glucose-Capillary: 98 mg/dL (ref 65–99)

## 2017-02-03 LAB — PHOSPHORUS: Phosphorus: 3 mg/dL (ref 2.5–4.6)

## 2017-02-03 LAB — MAGNESIUM: Magnesium: 1.5 mg/dL — ABNORMAL LOW (ref 1.7–2.4)

## 2017-02-03 MED ORDER — SODIUM CHLORIDE 0.9 % IV BOLUS (SEPSIS): 500.0000 mL | Freq: Once | INTRAVENOUS | Status: DC

## 2017-02-03 MED ORDER — AMOXICILLIN 500 MG PO CAPS
500.0000 mg | ORAL_CAPSULE | Freq: Three times a day (TID) | ORAL | Status: DC
  Administered 2017-02-03: 500 mg via ORAL
  Filled 2017-02-03 (×3): qty 1

## 2017-02-03 MED ORDER — MAGNESIUM SULFATE 2 GM/50ML IV SOLN
2.0000 g | Freq: Once | INTRAVENOUS | Status: DC
  Filled 2017-02-03: qty 50

## 2017-02-03 MED ORDER — TRAMADOL HCL 50 MG PO TABS: 50.0000 mg | ORAL_TABLET | Freq: Four times a day (QID) | ORAL | 0 refills | Status: DC | PRN

## 2017-02-03 MED ORDER — SODIUM CHLORIDE 0.9 % IV BOLUS (SEPSIS)
1000.0000 mL | Freq: Once | INTRAVENOUS | Status: AC
  Administered 2017-02-03: 1000 mL via INTRAVENOUS

## 2017-02-03 MED ORDER — AMOXICILLIN 500 MG PO CAPS: 500.0000 mg | ORAL_CAPSULE | Freq: Three times a day (TID) | ORAL | 0 refills | Status: DC

## 2017-02-03 NOTE — Progress Notes (Signed)
Patient d/c'd to [facility] approximately 20:30.  Another attempt to call report was made with no one answering the phone at [facility].  Patient alert and oriented and aware of discharge and returning to prior facility.  Macarthur Critchley, RN

## 2017-02-03 NOTE — Care Management Important Message (Signed)
Important Message  Patient Details  Name: Tonya Payne MRN: [id number] Date of Birth: 10-10-1934   Medicare Important Message Given:  Yes    [name] 02/03/2017, 10:00 AM

## 2017-02-03 NOTE — Progress Notes (Signed)
PT Cancellation Note  Patient Details Name: Tonya Payne MRN: [id number] DOB: 01-24-1935   Cancelled Treatment:    Reason Eval/Treat Not Completed: Patient declined, no reason specified.  Very grumpy woman who refuses everything. 02/03/2017  [place] Bing, PT [phone]  (pager)   Eliseo Gum Carlei Huang 02/03/2017, 1:49 PM

## 2017-02-03 NOTE — Progress Notes (Signed)
Patient will DC to: [facility] Anticipated DC date: 02/03/17 Family notified: Left vm for son Transport by: Sharin Mons   Per MD patient ready for DC back to [facility]. RN, patient, patient's family, and facility notified of DC. Discharge Summary sent to facility. RN given number for report [phone]). DC packet on chart. Ambulance transport requested for patient.   CSW signing off.  Cristobal Goldmann, [place] Clinical Social Worker [phone]

## 2017-02-03 NOTE — Discharge Summary (Signed)
Physician Discharge Summary  Blaike Newburn UJW:[id number] DOB: 1935-02-22 DOA: 01/29/2017  PCP: Rocky Morel, MD  Admit date: 01/29/2017 Discharge date: 02/03/2017  Admitted From: SNF Disposition:  SNF  Recommendations for Outpatient Follow-up:  1. Follow up with PCP in 1-2 weeks 2. Follow up for Back Compression Fracture 3. Please obtain CMP/CBC, Mag, Phos in one week  Home Health: No Equipment/Devices: None    Discharge Condition: Stable CODE STATUS: FULL CODE Diet recommendation: Heart Healthy Diet  Brief/Interim Summary: Tonya Hagmannis a 82 y.o.femalewith medical history significant for CAD status post CABG, AAA, chronic kidney disease stage III, and admission last month for fall with acute thoracic compression fracture, discharged to SNF for rehabilitation, and now presenting after a syncopal episode. Patient reports that she had initially been doing well at the rehabilitation facility, but reports developing nausea with vomiting and non-bloody diarrhea over the past 2 days. She reports lightheadedness upon standing associated with this, reports that she has suffered a loss of consciousness multiple times over the last 2 days, typically upon standing. This afternoon, she was reportedly exercising on a recumbent bike at the rehabilitation facility, stood up, and suffered a brief loss of consciousness without hitting her head. EMS was called for transport to the hospital. She denies fevers or chills, denies chest pain, denies shortness of breath or cough, and denies abdominal pain. She does not know of any other residents at her facility with similar GI illness. Prior to the last 2 days, she does not recall ever having a syncopal episode before.   Upon arrival to the ED, patient was found to be afebrile, saturating well on room air, and with vital signs otherwise stable. Patient later developed acute vomiting with jump in her heart rate to 130s and marked elevation in blood pressure.  CTA of the chest abdomen and pelvis is obtained and negative for acute pathology, but notable for stable infrarenal fusiform AAA without appreciable change since the CT performed one month ago. Patient was treated with empiric Rocephin for suspected UTI, given a dose of 10 mg labetalol, had systolic blood pressure drop into the 70s, and was treated with a liter of normal saline. Abx were changed to Cefepime and patient was admitted for Syncope likley from N/V/D and Suspected recurrent UTI.   Urine Grew out >100,000 CFU of E Coli and de-escalated Abx as Sensitives were Pansensitive and changed to Ampicillin and now changed to po Amoxil. Patient felt better yesterday but when working with PT became Orthostatic again and could not progress. Removed Foley and provide a bowel regimen as patient has not had a BM in over a week. Patient improved and this AM orthostatics were done and given 2 liters fluid boluses and patient was not orthostatic. At this time patient will be D/C'd back to SNF as she is deemed medically stable and will need to follow up with PCP as an outpatient.   Discharge Diagnoses:  Principal Problem:   Syncope Active Problems:   E. coli UTI (urinary tract infection)   AAA (abdominal aortic aneurysm) (HCC)   Hyponatremia   Hypertension   CKD (chronic kidney disease), stage III (HCC)   COPD (chronic obstructive pulmonary disease) (HCC)   CAD (coronary artery disease)   Nausea vomiting and diarrhea   Hypertensive urgency   Hypomagnesemia   Orthostatic hypotension   Constipation  1. Syncope likely from Dehydration and Orthostasis - Pt presented following a syncopal episode at her SNF  - Reports multiple such episodes over past 2  days, states that it has been upon standing  - She is too symptomatic in ED for orthostatic vitals; Done yesterday and per nursing patient dropped - With recent N/V/D and apparent hypovolemia, suspect this is orthostatic and confirmed - Plan to continue  cardiac monitoring, continue IVF hydration with NS at 75 mL/hr - Given 5 Liters since being hospitalized  - ECHOCARDIOGRAM Done and showed Normal LV size and systolic function, EF 60-65%. Normal RV size and systolic function. No significant valvular abnormalities - Strict I's and O's; Patient is -2.1183 Liters  - Repeat Orthostatic done and patient improved. - Stable to D/C home and will need PCP follow up.   2. Nausea, Vomiting, Diarrhea, improved - Pt reports N/V/D for past 2 days, does not know of anyone else at the SNF with similar sxs  - Denies blood in vomitus or stool, and denies abd pain  - Recently treated with Rocephin for UTI  - Continue IVF hydration at 75 mL/hr, prn antiemetics with Zofran 4 mg po/IV, send stool for GI panel and C diff if diarrhea recurs  - No recurrent Diarrhea today and patient improved - Was still orthostatic yesterday; Repeated Orthostatics this Afternoon and improved   3. Hypertension  - BP initially wnl in ED, became very high in setting of active vomiting, was treated with 10 mg IV labetalol, and then dropped SBP to mid-70's -Continue Metoprolol 25 mg po BID with holding parameters   4. CAD  - No anginal complaints  - Troponin undetectable x2 in ED several hrs apart  - Continue cardiac monitoring, continue Atorvastatin 40 mg po qHS, ASA 81 mg, and Metoprolol 25 mg po BID  - C/w Telemetry Monitoring   5. Hyponatremia, improved - Serum sodium is 131 on admission in the setting of hypovolemia  - She was treated in the ED with 1 liter NS, and given 5 Liters since then and continued on NS at 75 mL/hr  - Sodium improved to 137 - Repeat CMP in AM  6. E Coli UTI  - UA is suggestive of infection and suprapubic tenderness noted - WBC went from 11.4 -> 15.0 -> 8.7 -> 6.9 -> 6.5 - Prior cultures have grown multiple species  - She has been hospitalized recently and treated with Rocephin at that time for UTI, now residing in SNF, treated empirically  with Cefepime and will De-escalate Cefepime to IV Ampicillin; Now sending on po Amoxil for 1 day - Obtained Blood Cx and Showed NGTD at 4 day - Urine was purulent when Foley was inserted and patient has Flank Tenderness; Urine Cx showed >100,000 CFU of E Coli that was pansensitive - Obtained Kidney Ultrasound and showed No evidence of acute abnormality. No evidence of hydronephrosis. Upper limits of normal renal echogenicity with renal cortical thinning, mild on the right and moderate on the left. - D/C'd Foley today and patient able to urinate  7. CKD stage III - SCr is 1.25 on admission, up from 1-1.2 last month  - S/p 4 Liter boluses; C/w NS at 75 mL/hr - Repeat BUN/Cr this AM was <5/0.81 - Repeat CMP in AM  8. AAA - Stable from scan 1 month ago, followed by vascular surgery and asymptomatic  - Continue outpt vascular surgery follow-up   9. Orthostatic Hypotension, improved -BP dropped from Sitting to Standing -Given 2 Liters today and improved -C/w IVF at 75 mL/hr -Ordered TED Hose but patient was not able to tolerate them  -Repeat Orthostatics improved and patient was  not dizzy and is stable to D/C to SNF -PT recommending SNF  10. Hypomagnesemia, improved -Patient's Mag level was 1.5 this AM -Replete with IV Mag Sulfate 2 grams  -Continue to Monitor and Replete as Necessary -Repeat CMP as an outpatient   11. Constipation, improved -Started Senna-Docusate 1 tab po BID, Miralax 17 grams po BID, and given Bisacodyl 10 mg RC Suppository   Discharge Instructions  Discharge Instructions    Call MD for:  difficulty breathing, headache or visual disturbances    Complete by:  As directed    Call MD for:  extreme fatigue    Complete by:  As directed    Call MD for:  hives    Complete by:  As directed    Call MD for:  persistant dizziness or light-headedness    Complete by:  As directed    Call MD for:  persistant nausea and vomiting    Complete by:  As directed    Call  MD for:  redness, tenderness, or signs of infection (pain, swelling, redness, odor or green/yellow discharge around incision site)    Complete by:  As directed    Call MD for:  severe uncontrolled pain    Complete by:  As directed    Call MD for:  temperature >100.4    Complete by:  As directed    Diet - low sodium heart healthy    Complete by:  As directed    Discharge instructions    Complete by:  As directed    Follow up care at SNF. Follow up with PCP after discharge. Take all medications as prescribed. If symptoms change or worsen please return to the ED for evaluation.   Increase activity slowly    Complete by:  As directed      Allergies as of 02/03/2017   No Known Allergies     Medication List    TAKE these medications   amoxicillin 500 MG capsule Commonly known as:  AMOXIL Take 1 capsule (500 mg total) by mouth every 8 (eight) hours.   aspirin EC 81 MG tablet Take 81 mg by mouth daily.   atorvastatin 40 MG tablet Commonly known as:  LIPITOR Take 1 tablet (40 mg total) by mouth daily at 6 PM. What changed:  when to take this   docusate sodium 100 MG capsule Commonly known as:  COLACE Take 1 capsule (100 mg total) by mouth 2 (two) times daily.   feeding supplement (ENSURE ENLIVE) Liqd Take 237 mLs by mouth 2 (two) times daily between meals.   fexofenadine 180 MG tablet Commonly known as:  ALLEGRA Take 180 mg by mouth every evening.   fluticasone 50 MCG/ACT nasal spray Commonly known as:  FLONASE Place 1 spray into both nostrils daily.   megestrol 40 MG/ML suspension Commonly known as:  MEGACE Take 800 mg by mouth daily as needed for other. appetite   methocarbamol 500 MG tablet Commonly known as:  ROBAXIN Take 1 tablet (500 mg total) by mouth every 8 (eight) hours as needed for muscle spasms.   metoprolol tartrate 25 MG tablet Commonly known as:  LOPRESSOR Take 1 tablet (25 mg total) by mouth 2 (two) times daily.   MULTIVITAMIN ADULTS PO Take 1  tablet by mouth daily.   OLANZapine 5 MG tablet Commonly known as:  ZYPREXA Take 5 mg by mouth every evening.   polyethylene glycol packet Commonly known as:  MIRALAX / GLYCOLAX Take 17 g by mouth daily as  needed. What changed:  reasons to take this   sennosides-docusate sodium 8.6-50 MG tablet Commonly known as:  SENOKOT-S Take 2 tablets by mouth at bedtime. Hold for loose stools   sertraline 50 MG tablet Commonly known as:  ZOLOFT Take 0.5 tablets (25 mg total) by mouth every morning.   traMADol 50 MG tablet Commonly known as:  ULTRAM Take 1 tablet (50 mg total) by mouth every 6 (six) hours as needed. What changed:  reasons to take this       No Known Allergies  Consultations:  None  Procedures/Studies: US Renal  Result Date: 01/31/2017 CLINICAL DATA:  82 year old female with bilateral flank pain for several months. EXAM: RENAL / URINARY TRACT ULTRASOUND COMPLETE COMPARISON:  01/29/2017 CT FINDINGS: Right Kidney: Length: 9.8 cm. Upper limits of normal renal echogenicity with mild cortical thinning. No evidence of hydronephrosis or solid mass. Left Kidney: Length: 8.6 cm. Upper limits of normal renal echogenicity with moderate cortical thinning. No evidence of hydronephrosis or solid mass. Bladder: Foley catheter within the bladder noted.  No definite abnormalities. IMPRESSION: 1. No evidence of acute abnormality.  No evidence of hydronephrosis. 2. Upper limits of normal renal echogenicity with renal cortical thinning, mild on the right and moderate on the left. Electronically Signed   By: Harmon Pier M.D.   On: 01/31/2017 08:13   Ct L-spine No Charge  Result Date: 01/29/2017 CLINICAL DATA:  82 year old female with back pain and syncope. Known abdominal aortic aneurysm. T9 compression fracture in August after a fall. EXAM: CT LUMBAR SPINE WITHOUT CONTRAST TECHNIQUE: Multidetector CT imaging of the lumbar spine was performed without intravenous contrast administration.  Multiplanar CT image reconstructions were also generated. COMPARISON:  CTA Abdomen and Pelvis today reported separately. FINDINGS: Segmentation: Normal. The same numbering system was used on 12/29/2016 CT chest abdomen and pelvis. Alignment: Stable and largely normal lumbar vertebral height and alignment. Trace anterolisthesis of L5 on S1 is stable. Vertebrae: Osteopenia. No acute osseous abnormality identified. Visible sacrum and SI joints appear intact. Paraspinal and other soft tissues: Partially visible large 6 cm diameter infrarenal abdominal aortic aneurysm. See CT Abdomen and Pelvis today reported separately. No acute findings in the visualized posterior paraspinal soft tissues. Disc levels: T12-L1:  Negative. L1-L2:  Negative. L2-L3: Mild disc bulging with bilateral broad-based foraminal component. Mild epidural lipomatosis and facet hypertrophy. Mild if any spinal stenosis. No convincing foraminal stenosis. L3-L4: Mild circumferential disc bulge. Mild facet hypertrophy. No stenosis. L4-L5: Circumferential disc bulge eccentric to the right with broad-based posterior component. Prior laminectomy. Mild to moderate residual facet hypertrophy. Mild to moderate left and mild right L4 neural foraminal stenosis. L5-S1: Trace anterolisthesis. Circumferential disc bulge with broad-based posterior component. Prior laminectomy with severe residual facet hypertrophy. Right side vacuum facet. No spinal stenosis. Borderline to mild bilateral lateral recess and foraminal stenosis. IMPRESSION: 1. Partially visible large 6 cm infrarenal abdominal aortic aneurysm with prominent growth over the past 12 months suggesting an increased risk of aneurysm rupture. Recommend Vascular Surgery referral/consultation if not already obtained. 2. See also CTA Abdomen and Pelvis today reported separately. 3.  No acute osseous abnormality in the lumbar spine. 4. Mild if any degenerative lumbar spinal stenosis. Prior laminectomies at L4-L5  and L5-S1 with moderate to severe residual facet hypertrophy resulting in primarily mild bilateral foraminal stenosis. Electronically Signed   By: Odessa Fleming M.D.   On: 01/29/2017 16:59   Dg Chest Port 1 View  Result Date: 01/29/2017 CLINICAL DATA:  Chest pain and  acute onset shortness of breath for 1 week. EXAM: PORTABLE CHEST 1 VIEW COMPARISON:  CT chest December 29, 2016 and chest radiograph November 12, 2016 FINDINGS: Cardiac silhouette is mildly enlarged and unchanged. Status post median sternotomy for CABG. Calcified aortic knob. Mild chronic interstitial changes increased lung volumes without pleural effusion. Bibasilar strandy densities. No pleural effusion or focal consolidation. Biapical pleural thickening. No pneumothorax. Soft tissue planes and included osseous structures are nonacute. Osteopenia. IMPRESSION: Stable cardiomegaly.  COPD and bibasilar fibrosis. Electronically Signed   By: Awilda Metro M.D.   On: 01/29/2017 21:47   Ct Angio Abd/pel W And/or Wo Contrast  Result Date: 01/29/2017 CLINICAL DATA:  Abdominal aortic aneurysm fall.  Syncopal episodes. EXAM: CTA ABDOMEN AND PELVIS WITHOUT AND WITH CONTRAST TECHNIQUE: Multidetector CT imaging of the abdomen and pelvis was performed using the standard protocol during bolus administration of intravenous contrast. Multiplanar reconstructed images and MIPs were obtained and reviewed to evaluate the vascular anatomy. CONTRAST:  80 cc Isovue 370 IV COMPARISON:  12/29/2016 CT FINDINGS: VASCULAR Aorta: Infrarenal abdominal aortic aneurysm is again noted, stable in appearance, estimated at 5.9 x 5.4 cm without periaortic stranding to suggest rupture. Measurements are similar to most recent prior exam though reported as being 6 x 5.9 cm but should have been 6 x 5.49 cm. Extensive atherosclerotic atherosclerosis with mural thrombus is again seen. No acute vascular injury, retroperitoneal fluid or hemorrhage. Celiac: Patent with moderate calcific  atherosclerosis at its origin with a 50% stenosis suggested. No evidence of dissection, vasculitis or significant stenosis. SMA: Patent with mild-to-moderate atherosclerosis at the origin with less than 50% stenosis suggested. No evidence of aneurysm, dissection, vasculitis or significant stenosis. Renals: Single renal arteries to both kidneys with atherosclerosis along the proximal right renal artery and origin of the left with at least 50% stenosis suggested on the right and also likely in the left. No dissection, or evidence of fibromuscular dysplasia. No aneurysm. Perfusion is symmetric both kidneys however. IMA: Not definitively identified and is likely occluded. Inflow: Moderate atherosclerosis of the common iliac arteries without aneurysm, dissection or significant stenosis. Thin attenuated but patent internal iliac arteries with small external iliac arteries. Proximal Outflow: Patent without evidence of aneurysm, dissection, vasculitis or significant stenosis. Veins: No obvious venous abnormality within the limitations of this arterial phase study. Review of the MIP images confirms the above findings. NON-VASCULAR Lower chest: Extensive bilateral centrilobular and paraseptal emphysema with subpleural areas of fibrosis. Stable right lower lobe 8 mm opacity may reflect an area of chronic scarring, unchanged since recent comparison from 12/29/2016 although new since 10/23/2015. Hepatobiliary: No focal liver abnormality is seen. No gallstones, gallbladder wall thickening, or biliary dilatation. Pancreas: Unremarkable. No pancreatic ductal dilatation or surrounding inflammatory changes. Spleen: Normal in size without focal abnormality. Adrenals/Urinary Tract: Bilateral renal cortical thinning more pronounced on the left. No obstructive uropathy or nephrolithiasis. Tiny exophytic hyperdense lesion in the upper pole left kidney may represent a hemorrhagic or proteinaceous cyst. This measures 4 mm and is too small  to further characterize. Stomach/Bowel: Contracted stomach with normal small bowel rotation. No acute bowel inflammation or obstruction. Evidence of prior partial colectomy with anastomotic sutures along the expected location of the rectosigmoid. Small bowel anastomotic sutures are also seen in the mid pelvis. Appendix is not definitively identified but there is no evidence of right lower quadrant or pericecal inflammation. Lymphatic: No lymphadenopathy. Reproductive: Status post hysterectomy. No adnexal masses. Other: No abdominal wall hernia or abnormality. No abdominopelvic ascites. Musculoskeletal:  No acute or significant osseous findings. IMPRESSION: VASCULAR 1. Re- demonstration of a stable infrarenal fusiform abdominal aortic aneurysm unchanged since CT from 12/29/2016. It is currently estimated at 5.9 x 5.4 cm. No evidence rupture or leak. 2. 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 NON-VASCULAR Centrilobular and paraseptal emphysema of the lung bases with interstitial fibrosis. Probable scarring at the right lung base accounting for an 8 mm opacity seen. Electronically Signed   By: Tollie Eth M.D.   On: 01/29/2017 19:02   ECHOCARDIOGRAM 01/30/17 ------------------------------------------------------------------- Study Conclusions  - Left ventricle: The cavity size was normal. Wall thickness was   normal. Systolic function was normal. The estimated ejection   fraction was in the range of 60% to 65%. Wall motion was normal;   there were no regional wall motion abnormalities. Doppler   parameters are consistent with abnormal left ventricular   relaxation (grade 1 diastolic dysfunction). - Aortic valve: There was no stenosis. - Mitral valve: There was trivial regurgitation. - Right ventricle: The cavity size was normal. Systolic function   was  normal. - Pulmonary arteries: No complete TR doppler jet so unable to   estimate PA systolic pressure. - Inferior vena cava: The vessel was normal in size. The   respirophasic diameter changes were in the normal range (>= 50%),   consistent with normal central venous pressure.  Impressions:  - Normal LV size and systolic function, EF 60-65%. Normal RV size   and systolic function. No significant valvular abnormalities.  Subjective: Seen and examined at bedside and was improved. Complained of some Abdominal Pain but was not bad. No CP or SOB. No other concerns or complaints and did well with orthostatics this afternoon so will D/C back to SNF.  Discharge Exam: Vitals:   02/03/17 1315 02/03/17 1316  BP: 121/73 107/72  Pulse: 88 91  Resp:    Temp:    SpO2:     Vitals:   02/03/17 1309 02/03/17 1313 02/03/17 1315 02/03/17 1316  BP: (!) 156/72 121/74 121/73 107/72  Pulse: 75 83 88 91  Resp: 16     Temp: 98.1 F (36.7 C)     TempSrc: Oral     SpO2: 99% 99%    Weight:      Height:       General: Pt is alert, awake, not in acute distress Cardiovascular: RRR, S1/S2 +, no rubs, no gallops Respiratory: CTA bilaterally, no wheezing, no rhonchi Abdominal: Soft, NT, ND, bowel sounds + Extremities: no edema, no cyanosis  The results of significant diagnostics from this hospitalization (including imaging, microbiology, ancillary and laboratory) are listed below for reference.    Microbiology: Recent Results (from the past 240 hour(s))  Urine Culture     Status: Abnormal   Collection Time: 01/29/17  4:05 PM  Result Value Ref Range Status   Specimen Description URINE, RANDOM  Final   Special Requests NONE  Final   Culture >=100,000 COLONIES/mL ESCHERICHIA COLI (A)  Final   Report Status 02/01/2017 FINAL  Final   Organism ID, Bacteria ESCHERICHIA COLI (A)  Final      Susceptibility   Escherichia coli - MIC*    AMPICILLIN <=2 SENSITIVE Sensitive     CEFAZOLIN <=4 SENSITIVE  Sensitive     CEFTRIAXONE <=1 SENSITIVE Sensitive     CIPROFLOXACIN <=0.25 SENSITIVE Sensitive     GENTAMICIN <=1  SENSITIVE Sensitive     IMIPENEM <=0.25 SENSITIVE Sensitive     NITROFURANTOIN <=16 SENSITIVE Sensitive     TRIMETH/SULFA <=20 SENSITIVE Sensitive     AMPICILLIN/SULBACTAM <=2 SENSITIVE Sensitive     PIP/TAZO <=4 SENSITIVE Sensitive     Extended ESBL NEGATIVE Sensitive     * >=100,000 COLONIES/mL ESCHERICHIA COLI  MRSA PCR Screening     Status: None   Collection Time: 01/30/17  3:55 AM  Result Value Ref Range Status   MRSA by PCR NEGATIVE NEGATIVE Final    Comment:        The GeneXpert MRSA Assay (FDA approved for NASAL specimens only), is one component of a comprehensive MRSA colonization surveillance program. It is not intended to diagnose MRSA infection nor to guide or monitor treatment for MRSA infections.   Culture, blood (routine x 2)     Status: None (Preliminary result)   Collection Time: 01/30/17  9:01 AM  Result Value Ref Range Status   Specimen Description BLOOD LEFT ANTECUBITAL  Final   Special Requests IN PEDIATRIC BOTTLE Blood Culture adequate volume  Final   Culture NO GROWTH 4 DAYS  Final   Report Status PENDING  Incomplete  Culture, blood (routine x 2)     Status: None (Preliminary result)   Collection Time: 01/30/17  9:01 AM  Result Value Ref Range Status   Specimen Description BLOOD BLOOD LEFT ARM  Final   Special Requests   Final    BOTTLES DRAWN AEROBIC ONLY Blood Culture adequate volume   Culture NO GROWTH 4 DAYS  Final   Report Status PENDING  Incomplete    Labs: BNP (last 3 results) No results for input(s): BNP in the last 8760 hours. Basic Metabolic Panel:  Recent Labs Lab 01/30/17 0421 01/31/17 0417 02/01/17 0432 02/02/17 0455 02/03/17 0720  NA 133* 136 136 136 137  K 3.9 4.2 4.1 3.8 3.7  CL 104 108 108 109 109  CO2 20* 20* 22 21* 23  GLUCOSE 156* 95 100* 88 100*  BUN 5* <5*  CREATININE 1.22* 1.03* 0.90 0.89 0.81   CALCIUM 8.4* 8.3* 8.5* 8.3* 8.5*  MG  --  1.6* 2.1 1.7 1.5*  PHOS  --  3.1 2.9 2.9 3.0   Liver Function Tests:  Recent Labs Lab 01/29/17 1341 01/31/17 0417 02/01/17 0432 02/02/17 0455 02/03/17 0720  AST ALT ALKPHOS 107 75 81 76 76  BILITOT 0.7 0.3 0.3 0.5 0.6  PROT 7.3 5.4* 5.8* 5.4* 5.8*  ALBUMIN 3.3* 2.5* 2.6* 2.5* 2.6*    Recent Labs Lab 01/29/17 1341  LIPASE 30   No results for input(s): AMMONIA in the last 168 hours. CBC:  Recent Labs Lab 01/30/17 0421 01/31/17 0417 02/01/17 0432 02/02/17 0455 02/03/17 0720  WBC 15.0* 8.7 6.9 6.5 6.5  NEUTROABS 13.0* 5.3 3.5 3.6 3.7  HGB 10.3* 10.4* 10.4* 9.9* 10.7*  HCT 32.1* 32.3* 31.8* 30.1* 31.9*  MCV 92.8 93.6 92.4 92.3 91.7  PLT 253 237 286 270 294   Cardiac Enzymes: No results for input(s): CKTOTAL, CKMB, CKMBINDEX, TROPONINI in the last 168 hours. BNP: Invalid input(s): POCBNP CBG:  Recent Labs Lab 01/31/17 1231 02/01/17 0456 02/01/17 0856 02/02/17 0818 02/03/17 0517  GLUCAP 134* 96 98 97 98   D-Dimer No results for input(s): DDIMER in the last 72 hours. Hgb A1c No results for input(s): HGBA1C in the last 72 hours. Lipid Profile No  results for input(s): CHOL, HDL, LDLCALC, TRIG, CHOLHDL, LDLDIRECT in the last 72 hours. Thyroid function studies No results for input(s): TSH, T4TOTAL, T3FREE, THYROIDAB in the last 72 hours.  Invalid input(s): FREET3 Anemia work up No results for input(s): VITAMINB12, FOLATE, FERRITIN, TIBC, IRON, RETICCTPCT in the last 72 hours. Urinalysis    Component Value Date/Time   COLORURINE YELLOW 01/29/2017 2129   APPEARANCEUR HAZY (A) 01/29/2017 2129   LABSPEC 1.016 01/29/2017 2129   PHURINE 7.0 01/29/2017 2129   GLUCOSEU NEGATIVE 01/29/2017 2129   HGBUR NEGATIVE 01/29/2017 2129   BILIRUBINUR NEGATIVE 01/29/2017 2129   KETONESUR NEGATIVE 01/29/2017 2129   PROTEINUR 30 (A) 01/29/2017 2129   UROBILINOGEN 0.2 02/25/2015 1826   NITRITE  NEGATIVE 01/29/2017 2129   LEUKOCYTESUR MODERATE (A) 01/29/2017 2129   Sepsis Labs Invalid input(s): PROCALCITONIN,  WBC,  LACTICIDVEN Microbiology Recent Results (from the past 240 hour(s))  Urine Culture     Status: Abnormal   Collection Time: 01/29/17  4:05 PM  Result Value Ref Range Status   Specimen Description URINE, RANDOM  Final   Special Requests NONE  Final   Culture >=100,000 COLONIES/mL ESCHERICHIA COLI (A)  Final   Report Status 02/01/2017 FINAL  Final   Organism ID, Bacteria ESCHERICHIA COLI (A)  Final      Susceptibility   Escherichia coli - MIC*    AMPICILLIN <=2 SENSITIVE Sensitive     CEFAZOLIN <=4 SENSITIVE Sensitive     CEFTRIAXONE <=1 SENSITIVE Sensitive     CIPROFLOXACIN <=0.25 SENSITIVE Sensitive     GENTAMICIN <=1 SENSITIVE Sensitive     IMIPENEM <=0.25 SENSITIVE Sensitive     NITROFURANTOIN <=16 SENSITIVE Sensitive     TRIMETH/SULFA <=20 SENSITIVE Sensitive     AMPICILLIN/SULBACTAM <=2 SENSITIVE Sensitive     PIP/TAZO <=4 SENSITIVE Sensitive     Extended ESBL NEGATIVE Sensitive     * >=100,000 COLONIES/mL ESCHERICHIA COLI  MRSA PCR Screening     Status: None   Collection Time: 01/30/17  3:55 AM  Result Value Ref Range Status   MRSA by PCR NEGATIVE NEGATIVE Final    Comment:        The GeneXpert MRSA Assay (FDA approved for NASAL specimens only), is one component of a comprehensive MRSA colonization surveillance program. It is not intended to diagnose MRSA infection nor to guide or monitor treatment for MRSA infections.   Culture, blood (routine x 2)     Status: None (Preliminary result)   Collection Time: 01/30/17  9:01 AM  Result Value Ref Range Status   Specimen Description BLOOD LEFT ANTECUBITAL  Final   Special Requests IN PEDIATRIC BOTTLE Blood Culture adequate volume  Final   Culture NO GROWTH 4 DAYS  Final   Report Status PENDING  Incomplete  Culture, blood (routine x 2)     Status: None (Preliminary result)   Collection Time:  01/30/17  9:01 AM  Result Value Ref Range Status   Specimen Description BLOOD BLOOD LEFT ARM  Final   Special Requests   Final    BOTTLES DRAWN AEROBIC ONLY Blood Culture adequate volume   Culture NO GROWTH 4 DAYS  Final   Report Status PENDING  Incomplete   Time coordinating discharge: 35 minutes  SIGNED:  Merlene Laughter, DO [facility] 02/03/2017, 3:42 PM Pager [phone]  If 7PM-7AM, please contact night-coverage [email or web address] Password [email or web address]

## 2017-02-04 LAB — CULTURE, BLOOD (ROUTINE X 2)
CULTURE: NO GROWTH
Culture: NO GROWTH
SPECIAL REQUESTS: ADEQUATE
Special Requests: ADEQUATE

## 2017-02-06 ENCOUNTER — Ambulatory Visit: Payer: Medicare Other | Admitting: Vascular Surgery

## 2017-02-11 ENCOUNTER — Inpatient Hospital Stay (HOSPITAL_COMMUNITY)
Admission: EM | Admit: 2017-02-11 | Discharge: 2017-02-14 | DRG: 312 | Disposition: A | Discharge status: Skilled Nursing Facility | Payer: Medicare Other | Attending: Internal Medicine | Admitting: Internal Medicine

## 2017-02-11 ENCOUNTER — Emergency Department (HOSPITAL_COMMUNITY): Payer: Medicare Other

## 2017-02-11 ENCOUNTER — Encounter (HOSPITAL_COMMUNITY): Payer: Self-pay | Admitting: Emergency Medicine

## 2017-02-11 DIAGNOSIS — I4891 Unspecified atrial fibrillation: Secondary | ICD-10-CM | POA: Diagnosis present

## 2017-02-11 DIAGNOSIS — I2510 Atherosclerotic heart disease of native coronary artery without angina pectoris: Secondary | ICD-10-CM | POA: Diagnosis present

## 2017-02-11 DIAGNOSIS — Z8673 Personal history of transient ischemic attack (TIA), and cerebral infarction without residual deficits: Secondary | ICD-10-CM

## 2017-02-11 DIAGNOSIS — N183 Chronic kidney disease, stage 3 unspecified: Secondary | ICD-10-CM | POA: Diagnosis present

## 2017-02-11 DIAGNOSIS — Y92199 Unspecified place in other specified residential institution as the place of occurrence of the external cause: Secondary | ICD-10-CM

## 2017-02-11 DIAGNOSIS — I252 Old myocardial infarction: Secondary | ICD-10-CM

## 2017-02-11 DIAGNOSIS — Z951 Presence of aortocoronary bypass graft: Secondary | ICD-10-CM

## 2017-02-11 DIAGNOSIS — I951 Orthostatic hypotension: Secondary | ICD-10-CM | POA: Diagnosis not present

## 2017-02-11 DIAGNOSIS — W19XXXA Unspecified fall, initial encounter: Secondary | ICD-10-CM

## 2017-02-11 DIAGNOSIS — Z7951 Long term (current) use of inhaled steroids: Secondary | ICD-10-CM

## 2017-02-11 DIAGNOSIS — R42 Dizziness and giddiness: Secondary | ICD-10-CM

## 2017-02-11 DIAGNOSIS — Z96641 Presence of right artificial hip joint: Secondary | ICD-10-CM | POA: Diagnosis present

## 2017-02-11 DIAGNOSIS — J449 Chronic obstructive pulmonary disease, unspecified: Secondary | ICD-10-CM | POA: Diagnosis present

## 2017-02-11 DIAGNOSIS — Z823 Family history of stroke: Secondary | ICD-10-CM

## 2017-02-11 DIAGNOSIS — F39 Unspecified mood [affective] disorder: Secondary | ICD-10-CM | POA: Diagnosis present

## 2017-02-11 DIAGNOSIS — I5032 Chronic diastolic (congestive) heart failure: Secondary | ICD-10-CM | POA: Diagnosis present

## 2017-02-11 DIAGNOSIS — I714 Abdominal aortic aneurysm, without rupture, unspecified: Secondary | ICD-10-CM | POA: Diagnosis present

## 2017-02-11 DIAGNOSIS — Z7982 Long term (current) use of aspirin: Secondary | ICD-10-CM

## 2017-02-11 DIAGNOSIS — Z832 Family history of diseases of the blood and blood-forming organs and certain disorders involving the immune mechanism: Secondary | ICD-10-CM

## 2017-02-11 DIAGNOSIS — Z9049 Acquired absence of other specified parts of digestive tract: Secondary | ICD-10-CM

## 2017-02-11 DIAGNOSIS — I420 Dilated cardiomyopathy: Secondary | ICD-10-CM | POA: Diagnosis present

## 2017-02-11 DIAGNOSIS — J439 Emphysema, unspecified: Secondary | ICD-10-CM | POA: Diagnosis present

## 2017-02-11 DIAGNOSIS — I130 Hypertensive heart and chronic kidney disease with heart failure and stage 1 through stage 4 chronic kidney disease, or unspecified chronic kidney disease: Secondary | ICD-10-CM | POA: Diagnosis present

## 2017-02-11 DIAGNOSIS — E785 Hyperlipidemia, unspecified: Secondary | ICD-10-CM | POA: Diagnosis present

## 2017-02-11 DIAGNOSIS — W1830XA Fall on same level, unspecified, initial encounter: Secondary | ICD-10-CM | POA: Diagnosis present

## 2017-02-11 DIAGNOSIS — Z90710 Acquired absence of both cervix and uterus: Secondary | ICD-10-CM

## 2017-02-11 LAB — BASIC METABOLIC PANEL
Anion gap: 10 (ref 5–15)
BUN: 14 mg/dL (ref 6–20)
CO2: 24 mmol/L (ref 22–32)
CREATININE: 1.08 mg/dL — AB (ref 0.44–1.00)
Calcium: 9.5 mg/dL (ref 8.9–10.3)
Chloride: 100 mmol/L — ABNORMAL LOW (ref 101–111)
GFR, EST AFRICAN AMERICAN: 55 mL/min — AB (ref >60)
GFR, EST NON AFRICAN AMERICAN: 47 mL/min — AB (ref >60)
Glucose, Bld: 127 mg/dL — ABNORMAL HIGH (ref 65–99)
POTASSIUM: 4 mmol/L (ref 3.5–5.1)
SODIUM: 134 mmol/L — AB (ref 135–145)

## 2017-02-11 LAB — CBC
HCT: 36.5 % (ref 36.0–46.0)
Hemoglobin: 11.9 g/dL — ABNORMAL LOW (ref 12.0–15.0)
MCH: 30.4 pg (ref 26.0–34.0)
MCHC: 32.6 g/dL (ref 30.0–36.0)
MCV: 93.1 fL (ref 78.0–100.0)
PLATELETS: 364 10*3/uL (ref 150–400)
RBC: 3.92 MIL/uL (ref 3.87–5.11)
RDW: 13.3 % (ref 11.5–15.5)
WBC: 9.5 10*3/uL (ref 4.0–10.5)

## 2017-02-11 LAB — CBG MONITORING, ED: Glucose-Capillary: 104 mg/dL — ABNORMAL HIGH (ref 65–99)

## 2017-02-11 MED ORDER — SODIUM CHLORIDE 0.9 % IV BOLUS (SEPSIS)
1000.0000 mL | Freq: Once | INTRAVENOUS | Status: AC
  Administered 2017-02-11: 1000 mL via INTRAVENOUS

## 2017-02-11 MED ORDER — IOPAMIDOL (ISOVUE-370) INJECTION 76%
INTRAVENOUS | Status: AC
  Administered 2017-02-11: 100 mL
  Filled 2017-02-11: qty 100

## 2017-02-11 MED ORDER — TRAMADOL HCL 50 MG PO TABS
50.0000 mg | ORAL_TABLET | Freq: Once | ORAL | Status: AC
  Administered 2017-02-11: 50 mg via ORAL
  Filled 2017-02-11: qty 1

## 2017-02-11 MED ORDER — MECLIZINE HCL 25 MG PO TABS
25.0000 mg | ORAL_TABLET | Freq: Once | ORAL | Status: AC
  Administered 2017-02-11: 25 mg via ORAL
  Filled 2017-02-11: qty 1

## 2017-02-11 NOTE — H&P (Signed)
History and Physical    Tonya Payne UJW:[id number] DOB: 01-31-1935 DOA: 02/11/2017  PCP: Rocky Morel, MD  Patient coming from:  Rehab center  Chief Complaint:   dizziness  HPI: Tonya Payne is a 82 y.o. female with medical history significant of longstanding orthostatic dizziness/hypotension, AAA, freq falls, afib, CAD comes in with dizzy spell and falling.  Pt recently placed in rehab center for fall and compression fx of her spine.  She has been doing well in rehab.  Walking with a walker.  She still gets dizzy when she gets up to move around.  She does not like the food at the rehab and does not drink or eat well chronically.  Today she got dizzy while up and moving, sat down and got better.  No weakness, numbness, tingling anywhere.  No fevers.  No n/v/d.  No pain anywhere.  Her back pain has been well managed at the rehab.  Pt had orthostatics in the ED done which she dropped significantly and she became very symptomatic with dizziness.  This is all better with her lying back down.  Pt referred for admission for her symptoms which persist after 1 liter of ivf given.  Review of Systems: As per HPI otherwise 10 point review of systems negative.   Past Medical History:  Diagnosis Date  . AAA (abdominal aortic aneurysm) (HCC)   . AAA (abdominal aortic aneurysm) without rupture (HCC) 2016   CTA 10/23/2015 - saccular infrarenal aneurysm roughly 4.8 cm in diameter. Stable from 2016 -- Dr. Darrick Penna  . Anterior wall myocardial infarction (HCC) 10/08/2015   Presented with severe chest pain and dynamic anterior ST elevations/biphasic elevations area did not meet full criteria for STEMI, and troponin was not dramatically elevated. 95% LAD treated with PTCA followed by CABG  . Anxiety   . Atrial fibrillation, new onset (HCC) 10/2015   after CABG, in SR at d/c  . CAD (coronary artery disease), native coronary artery 10/08/2015   Severe coronary disease with calcified left main and LAD: 5% left  main going into ostial LAD. Mid LAD 95% (treated with PTCA). Proximal RCA 50%, calcified. EF was 20-25% with apical akinesis and distal anterior hypokinesis. --> Referred for CABG  . CAP (community acquired pneumonia) 07/2013   Hattie Perch 07/09/2013  . Congestive dilated cardiomyopathy (HCC) 10/08/2015   Intra-Op TEE showed EF of 40 and 45%. She had an echo with a "normal LV function "documented, but there was no reading M.D.  . COPD (chronic obstructive pulmonary disease) (HCC)   . Depression   . Headache    "weekly" (12/31/2016)  . Hyperlipidemia LDL goal <70 10/08/2015  . IBS (irritable bowel syndrome)   . Migraine    "none in years" (12/31/2016)  . Oral thrush   . Post PTCA 10/08/2015   Presented with STEMI, severe mid LAD lesion treated with PTCA and then referred for CABG.  . Syncope   . TIA (transient ischemic attack)     Past Surgical History:  Procedure Laterality Date  . ABDOMINAL HYSTERECTOMY    . BACK SURGERY    . CARDIAC CATHETERIZATION N/A 10/06/2015   Procedure: Left Heart Cath and Coronary Angiography;  Surgeon: Marykay Lex, MD;  Location: [facility] INVASIVE CV LAB;  Service: Cardiovascular;: Heavily calcified left main-LAD. 75% LM & Ost LAD, 95% d-mLAD, p-mRCA Calcified 50%. EF ~20-25%  . CARDIAC CATHETERIZATION N/A 10/06/2015   Procedure: Coronary Balloon Angioplasty;  Surgeon: Marykay Lex, MD;  Location: [facility] INVASIVE CV LAB;  Service:  Cardiovascular: PTCA of 95% LAD in setting of STEMI - reduced to 70% --> would need Atherectomy-PCI vs. CABG.  Sent for CABG.  . COLECTOMY    . CORONARY ARTERY BYPASS GRAFT N/A 10/16/2015   Procedure: CORONARY ARTERY BYPASS GRAFTING (CABG)  x three, using left internal mammary artery and right leg greater saphenous vein harvested endoscopically;  Surgeon: Loreli Slot, MD;  Location: [facility] OR;  Service: Open Heart Surgery;  Laterality: N/A;  . FRACTURE SURGERY    . HIP PINNING,CANNULATED Left 09/24/2016   Procedure: CANNULATED HIP PINNING LEFT  HIP;  Surgeon: Durene Romans, MD;  Location: [facility] ORS;  Service: Orthopedics;  Laterality: Left;  . LUMBAR DISC SURGERY     L3,4,5; S1"  . NECK SURGERY    . TEE WITHOUT CARDIOVERSION N/A 10/16/2015   Procedure: TRANSESOPHAGEAL ECHOCARDIOGRAM (TEE);  Surgeon: Loreli Slot, MD;  Location: [facility] OR;  Service: Open Heart Surgery - IntraOp:  EF 40-45%.  . TONSILLECTOMY    . TOTAL HIP ARTHROPLASTY Right      reports that she quit smoking about 22 months ago. Her smoking use included Cigarettes. She has a 25.00 pack-year smoking history. She has never used smokeless tobacco. She reports that she drinks alcohol. She reports that she does not use drugs.  No Known Allergies  Family History  Problem Relation Age of Onset  . Stroke Mother        dead  . Clotting disorder Father        dead  bone marrow disease    Prior to Admission medications   Medication Sig Start Date End Date Taking? Authorizing Provider  amoxicillin (AMOXIL) 500 MG capsule Take 1 capsule (500 mg total) by mouth every 8 (eight) hours. 02/03/17  Yes Sheikh, Omair Latif, DO  aspirin EC 81 MG tablet Take 81 mg by mouth daily.   Yes [provider]  atorvastatin (LIPITOR) 40 MG tablet Take 1 tablet (40 mg total) by mouth daily at 6 PM. Patient taking differently: Take 40 mg by mouth daily.  12/18/15  Yes Barrett, Erin R, PA-C  docusate sodium (COLACE) 100 MG capsule Take 1 capsule (100 mg total) by mouth 2 (two) times daily. 09/24/16  Yes Babish, Molli Hazard, PA-C  fexofenadine (ALLEGRA) 180 MG tablet Take 180 mg by mouth every evening.    Yes [provider]  fluticasone (FLONASE) 50 MCG/ACT nasal spray Place 1 spray into both nostrils daily. 11/16/16  Yes Vassie Loll, MD  megestrol (MEGACE) 40 MG/ML suspension Take 800 mg by mouth daily as needed for other. appetite 11/29/16  Yes [provider]  methocarbamol (ROBAXIN) 500 MG tablet Take 1 tablet (500 mg total) by mouth every 8 (eight) hours as needed  for muscle spasms. 01/01/17  Yes Maxie Barb, MD  metoprolol tartrate (LOPRESSOR) 25 MG tablet Take 1 tablet (25 mg total) by mouth 2 (two) times daily. 01/01/17  Yes Maxie Barb, MD  Multiple Vitamins-Minerals (MULTIVITAMIN ADULTS PO) Take 1 tablet by mouth daily.    Yes [provider]  OLANZapine (ZYPREXA) 5 MG tablet Take 5 mg by mouth every evening.  04/26/16  Yes [provider]  polyethylene glycol (MIRALAX / GLYCOLAX) packet Take 17 g by mouth daily as needed. Patient taking differently: Take 17 g by mouth daily as needed for mild constipation.  01/01/17  Yes Maxie Barb, MD  sertraline (ZOLOFT) 50 MG tablet Take 0.5 tablets (25 mg total) by mouth every morning. 11/16/16  Yes [place],  Mikle Bosworth, MD  traMADol (ULTRAM) 50 MG tablet Take 1 tablet (50 mg total) by mouth every 6 (six) hours as needed. Patient taking differently: Take 50 mg by mouth every 6 (six) hours as needed for moderate pain.  02/03/17  Yes Sheikh, Omair Latif, DO  feeding supplement, ENSURE ENLIVE, (ENSURE ENLIVE) LIQD Take 237 mLs by mouth 2 (two) times daily between meals. Patient not taking: Reported on 01/29/2017 09/26/16   Merlene Laughter, DO    Physical Exam: Vitals:   02/11/17 2245 02/11/17 2317 02/11/17 2318 02/11/17 2330  BP: (!) 149/104 (!) 154/86  (!) 156/85  Pulse: 88  90 89  Resp: 18 (!) 21 20 (!) 24  Temp:      TempSrc:      SpO2: 100%  98% 100%  Weight:      Height:        Constitutional: NAD, calm, comfortable Vitals:   02/11/17 2245 02/11/17 2317 02/11/17 2318 02/11/17 2330  BP: (!) 149/104 (!) 154/86  (!) 156/85  Pulse: 88  90 89  Resp: 18 (!) 21 20 (!) 24  Temp:      TempSrc:      SpO2: 100%  98% 100%  Weight:      Height:       Eyes: PERRL, lids and conjunctivae normal ENMT: Mucous membranes are moist. Posterior pharynx clear of any exudate or lesions.Normal dentition.  Neck: normal, supple, no masses, no thyromegaly Respiratory: clear to  auscultation bilaterally, no wheezing, no crackles. Normal respiratory effort. No accessory muscle use.  Cardiovascular: Regular rate and rhythm, no murmurs / rubs / gallops. No extremity edema. 2+ pedal pulses. No carotid bruits.  Abdomen: no tenderness, no masses palpated. No hepatosplenomegaly. Bowel sounds positive.  Musculoskeletal: no clubbing / cyanosis. No joint deformity upper and lower extremities. Good ROM, no contractures. Normal muscle tone.  Skin: no rashes, lesions, ulcers. No induration Neurologic: CN 2-12 grossly intact. Sensation intact, DTR normal. Strength 5/5 in all 4.  Psychiatric: Normal judgment and insight. Alert and oriented x 3. Normal mood.    Labs on Admission: I have personally reviewed following labs and imaging studies  CBC:  Recent Labs Lab 02/11/17 1910  WBC 9.5  HGB 11.9*  HCT 36.5  MCV 93.1  PLT 364   Basic Metabolic Panel:  Recent Labs Lab 02/11/17 1910  NA 134*  K 4.0  CL 100*  CO2 24  GLUCOSE 127*  BUN 14  CREATININE 1.08*  CALCIUM 9.5   GFR: Estimated Creatinine Clearance: 29.8 mL/min (A) (by C-G formula based on SCr of 1.08 mg/dL (H)).  CBG:  Recent Labs Lab 02/11/17 2235  GLUCAP 104*    Radiological Exams on Admission: Ct Head Wo Contrast  Result Date: 02/11/2017 CLINICAL DATA:  Acute onset of dizziness, with fall to the right side. Right-sided head and neck pain. Initial encounter. EXAM: CT HEAD WITHOUT CONTRAST CT CERVICAL SPINE WITHOUT CONTRAST TECHNIQUE: Multidetector CT imaging of the head and cervical spine was performed following the standard protocol without intravenous contrast. Multiplanar CT image reconstructions of the cervical spine were also generated. COMPARISON:  CT of the head and cervical spine performed 12/29/2016 FINDINGS: CT HEAD FINDINGS Brain: No evidence of acute infarction, hemorrhage, hydrocephalus, extra-axial collection or mass lesion/mass effect. Prominence of the ventricles and sulci reflects  moderate cortical volume loss. Mild cerebellar atrophy is noted. Scattered periventricular and subcortical white matter change likely reflects small vessel ischemic microangiopathy. Chronic lacunar infarcts are noted at the basal ganglia  bilaterally. A chronic infarct is noted at the right occipital lobe, with associated encephalomalacia. The brainstem and fourth ventricle are within normal limits. No mass effect or midline shift is seen. Vascular: No hyperdense vessel or unexpected calcification. Skull: There is no evidence of fracture; visualized osseous structures are unremarkable in appearance. Sinuses/Orbits: The visualized portions of the orbits are within normal limits. The paranasal sinuses and mastoid air cells are well-aerated. Other: No significant soft tissue abnormalities are seen. CT CERVICAL SPINE FINDINGS Alignment: Normal. Skull base and vertebrae: No acute fracture. No primary bone lesion or focal pathologic process. Soft tissues and spinal canal: No prevertebral fluid or swelling. No visible canal hematoma. Disc levels: The patient is status post anterior cervical spinal fusion at C5-C7, with mild underlying facet disease. Mild degenerative change is noted about the dens. Upper chest: Scattered calcification is noted along the carotid bifurcations bilaterally, more prominent on the left. The visualized portions of the thyroid gland are unremarkable. Mild scarring is noted at the lung apices. Other: No additional soft tissue abnormalities are seen. IMPRESSION: 1. No evidence of traumatic intracranial injury or fracture. 2. No evidence of fracture or subluxation along the cervical spine. 3. Moderate cortical volume loss and scattered small vessel ischemic microangiopathy. 4. Chronic lacunar infarcts at the basal ganglia bilaterally. Chronic infarct at the right occipital lobe, with associated encephalomalacia. 5. Status post anterior cervical spinal fusion at C5-C7, with minimal underlying  degenerative change. 6. Scattered calcification along the carotid bifurcations, more prominent on the left. Carotid ultrasound would be helpful for further evaluation, when and as deemed clinically appropriate. Electronically Signed   By: Roanna Raider M.D.   On: 02/11/2017 22:35   Ct Cervical Spine Wo Contrast  Result Date: 02/11/2017 CLINICAL DATA:  Acute onset of dizziness, with fall to the right side. Right-sided head and neck pain. Initial encounter. EXAM: CT HEAD WITHOUT CONTRAST CT CERVICAL SPINE WITHOUT CONTRAST TECHNIQUE: Multidetector CT imaging of the head and cervical spine was performed following the standard protocol without intravenous contrast. Multiplanar CT image reconstructions of the cervical spine were also generated. COMPARISON:  CT of the head and cervical spine performed 12/29/2016 FINDINGS: CT HEAD FINDINGS Brain: No evidence of acute infarction, hemorrhage, hydrocephalus, extra-axial collection or mass lesion/mass effect. Prominence of the ventricles and sulci reflects moderate cortical volume loss. Mild cerebellar atrophy is noted. Scattered periventricular and subcortical white matter change likely reflects small vessel ischemic microangiopathy. Chronic lacunar infarcts are noted at the basal ganglia bilaterally. A chronic infarct is noted at the right occipital lobe, with associated encephalomalacia. The brainstem and fourth ventricle are within normal limits. No mass effect or midline shift is seen. Vascular: No hyperdense vessel or unexpected calcification. Skull: There is no evidence of fracture; visualized osseous structures are unremarkable in appearance. Sinuses/Orbits: The visualized portions of the orbits are within normal limits. The paranasal sinuses and mastoid air cells are well-aerated. Other: No significant soft tissue abnormalities are seen. CT CERVICAL SPINE FINDINGS Alignment: Normal. Skull base and vertebrae: No acute fracture. No primary bone lesion or focal  pathologic process. Soft tissues and spinal canal: No prevertebral fluid or swelling. No visible canal hematoma. Disc levels: The patient is status post anterior cervical spinal fusion at C5-C7, with mild underlying facet disease. Mild degenerative change is noted about the dens. Upper chest: Scattered calcification is noted along the carotid bifurcations bilaterally, more prominent on the left. The visualized portions of the thyroid gland are unremarkable. Mild scarring is noted at the lung  apices. Other: No additional soft tissue abnormalities are seen. IMPRESSION: 1. No evidence of traumatic intracranial injury or fracture. 2. No evidence of fracture or subluxation along the cervical spine. 3. Moderate cortical volume loss and scattered small vessel ischemic microangiopathy. 4. Chronic lacunar infarcts at the basal ganglia bilaterally. Chronic infarct at the right occipital lobe, with associated encephalomalacia. 5. Status post anterior cervical spinal fusion at C5-C7, with minimal underlying degenerative change. 6. Scattered calcification along the carotid bifurcations, more prominent on the left. Carotid ultrasound would be helpful for further evaluation, when and as deemed clinically appropriate. Electronically Signed   By: Roanna Raider M.D.   On: 02/11/2017 22:35   Ct Angio Chest/abd/pel For Dissection W And/or Wo Contrast  Result Date: 02/11/2017 CLINICAL DATA:  Acute onset of dizziness. Status post fall. Follow-up known thoracoabdominal aortic aneurysm. Initial encounter. EXAM: CT ANGIOGRAPHY CHEST, ABDOMEN AND PELVIS TECHNIQUE: Multidetector CT imaging through the chest, abdomen and pelvis was performed using the standard protocol during bolus administration of intravenous contrast. Multiplanar reconstructed images and MIPs were obtained and reviewed to evaluate the vascular anatomy. CONTRAST:  90 mL of Isovue 370 IV contrast COMPARISON:  CT of the chest performed 12/29/2016, and CTA of the abdomen  and pelvis performed 01/29/2017 FINDINGS: CTA CHEST FINDINGS Cardiovascular: There is no evidence of thoracic aortic aneurysm. There is no evidence of aortic dissection. Scattered calcification is noted along the aortic arch and proximal great vessels, and along the descending thoracic aorta, with minimal associated mural thrombus but no significant luminal narrowing. The heart remains normal in size. The patient is status post median sternotomy, with evidence of prior CABG. Mediastinum/Nodes: No mediastinal lymphadenopathy is seen. No pericardial effusion is identified. The visualized portions of the thyroid gland are unremarkable. No axillary lymphadenopathy is appreciated. Lungs/Pleura: Scarring is noted at the lung apices. Bilateral emphysema is noted, with underlying scarring and peripheral nodularity. No pleural effusion or pneumothorax is seen. No dominant mass is identified. Musculoskeletal: No acute osseous abnormalities are identified. A compression fracture of vertebral body T9 appears slightly worsened from the prior study. The visualized musculature is unremarkable in appearance. Review of the MIP images confirms the above findings. CTA ABDOMEN AND PELVIS FINDINGS VASCULAR Aorta: There is aneurysmal dilatation of the infrarenal abdominal aorta to 5.6 cm in AP dimension and 5.8 cm in transverse dimension, relatively stable from the recent prior CTA. Underlying thrombosis of the aneurysm sac is relatively stable in appearance. The aneurysm resolves just proximal to the aortic bifurcation. No soft tissue inflammation is seen. Scattered calcification is noted along the abdominal aorta. Celiac: The celiac trunk appears grossly intact. SMA: Mild calcification is noted along the superior mesenteric artery, though it remains grossly intact. Renals: There is calcification along the proximal renal arteries bilaterally, with luminal narrowing along the proximal right renal artery. Would correlate for evidence of  refractory hypertension. IMA: The inferior mesenteric artery is not visualized. Inflow: Scattered calcification is noted along the common, internal and external iliac arteries bilaterally. Scattered calcification is seen at the common femoral arteries bilaterally. Veins: Visualized venous structures are grossly unremarkable in appearance. Review of the MIP images confirms the above findings. NON-VASCULAR Hepatobiliary: The liver is unremarkable in appearance. The gallbladder is unremarkable in appearance. The common bile duct remains normal in caliber. Pancreas: The pancreas is within normal limits. Spleen: The spleen is unremarkable in appearance. Adrenals/Urinary Tract: The adrenal glands are unremarkable in appearance. There is moderate left renal atrophy, and mild bilateral renal scarring. There is  chronic right-sided renal pelvicaliectasis, without significant hydronephrosis. No distal obstructing stone is identified. No significant perinephric stranding is appreciated. Stomach/Bowel: No significant small bowel abnormalities are seen. The stomach is grossly unremarkable in appearance. The appendix is not definitely seen; there is no evidence for appendicitis. Scattered diverticulosis is noted along the sigmoid colon. Postoperative change is noted at the distal sigmoid colon. There is no evidence of diverticulitis at this time. Lymphatic: No retroperitoneal or pelvic sidewall lymphadenopathy is seen. Reproductive: The bladder is mildly distended and grossly unremarkable in appearance. The patient is status post hysterectomy. No suspicious adnexal masses are seen. Other: No additional soft tissue abnormalities are seen. Musculoskeletal: No acute osseous abnormalities are identified. Pins are noted at the left femoral neck. The visualized musculature is unremarkable in appearance. Review of the MIP images confirms the above findings. IMPRESSION: 1. Infrarenal abdominal aortic aneurysm measures approximately 5.6 cm  in AP dimension and 5.8 cm in transverse dimension, relatively stable from the recent prior CTA. Underlying thrombosis of the aneurysm sac is also stable in appearance. The aneurysm resolves just proximal to the aortic bifurcation. 2. No evidence of thoracic aortic aneurysm. No evidence of aortic dissection. 3. Scattered aortic atherosclerosis. 4. Bilateral emphysema, with underlying scarring and peripheral nodularity. No dominant mass seen. Scarring at the lung apices. 5. Slightly worsened compression fracture vertebral body T9, in comparison to the prior CT of the chest. 6. Moderate left renal atrophy, and mild bilateral renal scarring. 7. Scattered diverticulosis along the sigmoid colon, without evidence of diverticulitis. Electronically Signed   By: Roanna Raider M.D.   On: 02/11/2017 22:51    Old chart reviewed Case discussed with edp  Assessment/Plan 82 yo female with symptoms c/w orthostatic hypotension  Principal Problem:   Orthostatic hypotension- ivf overnight.  Cancel mri/a brain with no focal neuro deficits.  If symptoms persist despite ivf and resolving her orthostasis would consider further neuro imaging.  She has had longstanding issues with this.  Will hold bblocker at this time.  Place stockings.  Ck orthostatic vitals per shift until normalizes more.   Active Problems:   Congestive dilated cardiomyopathy (HCC)- not on diuretic, stable and compensated at this time   S/P CABG x 3- will ck 12 lead ekg due to symptoms of dizziness and keep on tele monitoring while here   AAA (abdominal aortic aneurysm) (HCC)- stable   Fall- due to above   Orthostatic dizziness- as above   CKD (chronic kidney disease), stage III (HCC)- stable   COPD (chronic obstructive pulmonary disease) (HCC)- stable, cont home nebs    Pt seen before midnight   DVT prophylaxis:  scds Code Status: full Family Communication:  none Disposition Plan:  Per day team Consults called:  none Admission status:   observation   Wong Steadham A MD [facility]  If 7PM-7AM, please contact night-coverage [email or web address] Password [email or web address]  02/11/2017, 11:38 PM

## 2017-02-11 NOTE — ED Notes (Signed)
Patient transported to CT 

## 2017-02-11 NOTE — ED Notes (Signed)
Admitting at the bedside.  

## 2017-02-11 NOTE — ED Provider Notes (Signed)
[facility]-EMERGENCY DEPT Provider Note   CSN: [id number] Arrival date & time: 02/11/17  4098     History   Chief Complaint Chief Complaint  Patient presents with  . Fall    HPI Tonya Payne is a 82 y.o. female.  HPI   Got out of the bed at the rehab facility, and went to walk to the wheelchair and everything spun, then she fell down.  Fell flat onto the concrete floor, hurt back and head with the fall.  Right side upper back area with pain, similar to vertebrate pain she had experienced with fall in August and T9 fx.  Had improved since that accident until falling again today. Not having pain at rest.  Headache is 5/10 on right side, and also lower right ribs.  No shortness of breath.  Still feeling dizzy now, both lightheaded and room spinning.  No abdominal pain.  Mild neck pain.  No chest pain.  No nausea or vomiting. No change in vision.  No numbness or weakness on one side or the other.  Has not tried to walk since this occurred.  Dizziness and trouble walking were different from what she had previously had.    Past Medical History:  Diagnosis Date  . AAA (abdominal aortic aneurysm) (HCC)   . AAA (abdominal aortic aneurysm) without rupture (HCC) 2016   CTA 10/23/2015 - saccular infrarenal aneurysm roughly 4.8 cm in diameter. Stable from 2016 -- Dr. Darrick Penna  . Anterior wall myocardial infarction (HCC) 10/08/2015   Presented with severe chest pain and dynamic anterior ST elevations/biphasic elevations area did not meet full criteria for STEMI, and troponin was not dramatically elevated. 95% LAD treated with PTCA followed by CABG  . Anxiety   . Atrial fibrillation, new onset (HCC) 10/2015   after CABG, in SR at d/c  . CAD (coronary artery disease), native coronary artery 10/08/2015   Severe coronary disease with calcified left main and LAD: 5% left main going into ostial LAD. Mid LAD 95% (treated with PTCA). Proximal RCA 50%, calcified. EF was 20-25% with apical akinesis and distal  anterior hypokinesis. --> Referred for CABG  . CAP (community acquired pneumonia) 07/2013   Hattie Perch 07/09/2013  . Congestive dilated cardiomyopathy (HCC) 10/08/2015   Intra-Op TEE showed EF of 40 and 45%. She had an echo with a "normal LV function "documented, but there was no reading M.D.  . COPD (chronic obstructive pulmonary disease) (HCC)   . Depression   . Headache    "weekly" (12/31/2016)  . Hyperlipidemia LDL goal <70 10/08/2015  . IBS (irritable bowel syndrome)   . Migraine    "none in years" (12/31/2016)  . Oral thrush   . Post PTCA 10/08/2015   Presented with STEMI, severe mid LAD lesion treated with PTCA and then referred for CABG.  . Syncope   . TIA (transient ischemic attack)     Patient Active Problem List   Diagnosis Date Noted  . Constipation 02/02/2017  . Hypomagnesemia 01/31/2017  . Orthostatic hypotension 01/31/2017  . Syncope 01/29/2017  . Nausea vomiting and diarrhea 01/29/2017  . Hypertensive urgency 01/29/2017  . COPD (chronic obstructive pulmonary disease) (HCC) 12/30/2016  . Closed compression fracture of thoracic vertebra (HCC) 12/30/2016  . Abdominal pain 12/30/2016  . Alcohol use 12/30/2016  . CAD (coronary artery disease) 12/30/2016  . Scalp laceration   . Acute renal failure superimposed on stage 3 chronic kidney disease (HCC)   . Dehydration   . Physical deconditioning   . Pressure  injury of skin 11/12/2016  . CKD (chronic kidney disease), stage III (HCC) 09/24/2016  . Closed left hip fracture, with routine healing, subsequent encounter 09/23/2016  . Hyponatremia 09/23/2016  . Hypertension 09/23/2016  . Left displaced femoral neck fracture (HCC)   . Closed fracture of multiple pubic rami, right, sequela 02/27/2016  . Displaced fracture of neck of right second metacarpal bone with routine healing 02/27/2016  . Protein-calorie malnutrition, severe (HCC) 02/22/2016  . Orthostatic dizziness 02/22/2016  . Postoperative atrial fibrillation (HCC)  02/22/2016  . Fall   . Hand fracture   . Pelvic fracture (HCC) 01/26/2016  . AAA (abdominal aortic aneurysm) (HCC) 11/30/2015  . Post PTCA   . S/P CABG x 3 10/16/2015  . Coronary artery disease involving native coronary artery with angina pectoris (HCC) 10/08/2015  . Congestive dilated cardiomyopathy (HCC) 10/08/2015  . Hyperlipidemia LDL goal <70 10/08/2015  . E. coli UTI (urinary tract infection) 10/06/2015  . Myocardial infarction involving left anterior descending (LAD) coronary artery (HCC) 10/06/2015    Past Surgical History:  Procedure Laterality Date  . ABDOMINAL HYSTERECTOMY    . BACK SURGERY    . CARDIAC CATHETERIZATION N/A 10/06/2015   Procedure: Left Heart Cath and Coronary Angiography;  Surgeon: Marykay Lex, MD;  Location: [facility] INVASIVE CV LAB;  Service: Cardiovascular;: Heavily calcified left main-LAD. 75% LM & Ost LAD, 95% d-mLAD, p-mRCA Calcified 50%. EF ~20-25%  . CARDIAC CATHETERIZATION N/A 10/06/2015   Procedure: Coronary Balloon Angioplasty;  Surgeon: Marykay Lex, MD;  Location: [facility] INVASIVE CV LAB;  Service: Cardiovascular: PTCA of 95% LAD in setting of STEMI - reduced to 70% --> would need Atherectomy-PCI vs. CABG.  Sent for CABG.  . COLECTOMY    . CORONARY ARTERY BYPASS GRAFT N/A 10/16/2015   Procedure: CORONARY ARTERY BYPASS GRAFTING (CABG)  x three, using left internal mammary artery and right leg greater saphenous vein harvested endoscopically;  Surgeon: Loreli Slot, MD;  Location: [facility] OR;  Service: Open Heart Surgery;  Laterality: N/A;  . FRACTURE SURGERY    . HIP PINNING,CANNULATED Left 09/24/2016   Procedure: CANNULATED HIP PINNING LEFT HIP;  Surgeon: Durene Romans, MD;  Location: [facility] ORS;  Service: Orthopedics;  Laterality: Left;  . LUMBAR DISC SURGERY     L3,4,5; S1"  . NECK SURGERY    . TEE WITHOUT CARDIOVERSION N/A 10/16/2015   Procedure: TRANSESOPHAGEAL ECHOCARDIOGRAM (TEE);  Surgeon: Loreli Slot, MD;  Location: [facility] OR;  Service: Open  Heart Surgery - IntraOp:  EF 40-45%.  . TONSILLECTOMY    . TOTAL HIP ARTHROPLASTY Right     OB History    No data available       Home Medications    Prior to Admission medications   Medication Sig Start Date End Date Taking? Authorizing Provider  amoxicillin (AMOXIL) 500 MG capsule Take 1 capsule (500 mg total) by mouth every 8 (eight) hours. 02/03/17  Yes Sheikh, Omair Latif, DO  aspirin EC 81 MG tablet Take 81 mg by mouth daily.   Yes [provider]  atorvastatin (LIPITOR) 40 MG tablet Take 1 tablet (40 mg total) by mouth daily at 6 PM. Patient taking differently: Take 40 mg by mouth daily.  12/18/15  Yes Barrett, Heinz Eckert R, PA-C  docusate sodium (COLACE) 100 MG capsule Take 1 capsule (100 mg total) by mouth 2 (two) times daily. 09/24/16  Yes Babish, Molli Hazard, PA-C  fexofenadine (ALLEGRA) 180 MG tablet Take 180 mg by mouth every evening.  Yes [provider]  fluticasone (FLONASE) 50 MCG/ACT nasal spray Place 1 spray into both nostrils daily. 11/16/16  Yes Vassie Loll, MD  megestrol (MEGACE) 40 MG/ML suspension Take 800 mg by mouth daily as needed for other. appetite 11/29/16  Yes [provider]  methocarbamol (ROBAXIN) 500 MG tablet Take 1 tablet (500 mg total) by mouth every 8 (eight) hours as needed for muscle spasms. 01/01/17  Yes Maxie Barb, MD  metoprolol tartrate (LOPRESSOR) 25 MG tablet Take 1 tablet (25 mg total) by mouth 2 (two) times daily. 01/01/17  Yes Maxie Barb, MD  Multiple Vitamins-Minerals (MULTIVITAMIN ADULTS PO) Take 1 tablet by mouth daily.    Yes [provider]  OLANZapine (ZYPREXA) 5 MG tablet Take 5 mg by mouth every evening.  04/26/16  Yes [provider]  polyethylene glycol (MIRALAX / GLYCOLAX) packet Take 17 g by mouth daily as needed. Patient taking differently: Take 17 g by mouth daily as needed for mild constipation.  01/01/17  Yes Maxie Barb, MD  sertraline (ZOLOFT) 50 MG tablet  Take 0.5 tablets (25 mg total) by mouth every morning. 11/16/16  Yes Vassie Loll, MD  traMADol (ULTRAM) 50 MG tablet Take 1 tablet (50 mg total) by mouth every 6 (six) hours as needed. Patient taking differently: Take 50 mg by mouth every 6 (six) hours as needed for moderate pain.  02/03/17  Yes Sheikh, Omair Latif, DO  feeding supplement, ENSURE ENLIVE, (ENSURE ENLIVE) LIQD Take 237 mLs by mouth 2 (two) times daily between meals. Patient not taking: Reported on 01/29/2017 09/26/16   Merlene Laughter, DO    Family History Family History  Problem Relation Age of Onset  . Stroke Mother        dead  . Clotting disorder Father        dead  bone marrow disease    Social History Social History  Substance Use Topics  . Smoking status: Former Smoker    Packs/day: 0.50    Years: 50.00    Types: Cigarettes    Quit date: 04/11/2015  . Smokeless tobacco: Never Used  . Alcohol use Yes     Comment: 12/31/2016 "glass of wine maybe once/month"     Allergies   Patient has no known allergies.   Review of Systems Review of Systems  Constitutional: Negative for appetite change, fatigue and fever.  HENT: Negative for sore throat.   Eyes: Negative for visual disturbance.  Respiratory: Negative for cough and shortness of breath.   Cardiovascular: Negative for chest pain (right lower rib pain) and leg swelling.  Gastrointestinal: Negative for abdominal pain, constipation, diarrhea, nausea and vomiting.  Genitourinary: Negative for difficulty urinating and dysuria.  Musculoskeletal: Positive for arthralgias (knee pain prior to fall today), gait problem and neck pain. Negative for back pain.  Skin: Negative for rash.  Neurological: Positive for dizziness, light-headedness and headaches. Negative for syncope, facial asymmetry, speech difficulty, weakness and numbness.     Physical Exam Updated Vital Signs BP (!) 149/104   Pulse 88   Temp (!) 97.4 F (36.3 C) (Oral)   Resp 18   Ht 5'  (1.524 m)   Wt 53.5 kg (118 lb)   SpO2 100%   BMI 23.05 kg/m   Physical Exam  Constitutional: She is oriented to person, place, and time. She appears well-developed and well-nourished. No distress.  HENT:  Head: Normocephalic and atraumatic.  No nystagmus or change of gaze with head impulse testing  Eyes: Conjunctivae and EOM are normal.  Neck: Normal range of motion.  Cardiovascular: Normal rate, regular rhythm, normal heart sounds and intact distal pulses.  Exam reveals no gallop and no friction rub.   No murmur heard. Pulmonary/Chest: Effort normal and breath sounds normal. No respiratory distress. She has no wheezes. She has no rales.  Right lower rib back tenderness  Abdominal: Soft. She exhibits pulsatile midline mass. She exhibits no distension. There is no tenderness. There is no guarding.  Musculoskeletal: She exhibits no edema.       Cervical back: She exhibits tenderness and bony tenderness.       Thoracic back: She exhibits tenderness.  Neurological: She is alert and oriented to person, place, and time. She has normal strength. No cranial nerve deficit (mild difficulty with superior gaze bilaterally) or sensory deficit. Coordination normal. GCS eye subscore is 4. GCS verbal subscore is 5. GCS motor subscore is 6.  Skin: Skin is warm and dry. No rash noted. She is not diaphoretic. No erythema.  Nursing note and vitals reviewed.    ED Treatments / Results  Labs (all labs ordered are listed, but only abnormal results are displayed) Labs Reviewed  BASIC METABOLIC PANEL - Abnormal; Notable for the following:       Result Value   Sodium 134 (*)    Chloride 100 (*)    Glucose, Bld 127 (*)    Creatinine, Ser 1.08 (*)    GFR calc non Af Amer 47 (*)    GFR calc Af Amer 55 (*)    All other components within normal limits  CBC - Abnormal; Notable for the following:    Hemoglobin 11.9 (*)    All other components within normal limits  CBG MONITORING, ED - Abnormal; Notable  for the following:    Glucose-Capillary 104 (*)    All other components within normal limits  URINALYSIS, ROUTINE W REFLEX MICROSCOPIC    EKG  EKG Interpretation None       Radiology Ct Head Wo Contrast  Result Date: 02/11/2017 CLINICAL DATA:  Acute onset of dizziness, with fall to the right side. Right-sided head and neck pain. Initial encounter. EXAM: CT HEAD WITHOUT CONTRAST CT CERVICAL SPINE WITHOUT CONTRAST TECHNIQUE: Multidetector CT imaging of the head and cervical spine was performed following the standard protocol without intravenous contrast. Multiplanar CT image reconstructions of the cervical spine were also generated. COMPARISON:  CT of the head and cervical spine performed 12/29/2016 FINDINGS: CT HEAD FINDINGS Brain: No evidence of acute infarction, hemorrhage, hydrocephalus, extra-axial collection or mass lesion/mass effect. Prominence of the ventricles and sulci reflects moderate cortical volume loss. Mild cerebellar atrophy is noted. Scattered periventricular and subcortical white matter change likely reflects small vessel ischemic microangiopathy. Chronic lacunar infarcts are noted at the basal ganglia bilaterally. A chronic infarct is noted at the right occipital lobe, with associated encephalomalacia. The brainstem and fourth ventricle are within normal limits. No mass effect or midline shift is seen. Vascular: No hyperdense vessel or unexpected calcification. Skull: There is no evidence of fracture; visualized osseous structures are unremarkable in appearance. Sinuses/Orbits: The visualized portions of the orbits are within normal limits. The paranasal sinuses and mastoid air cells are well-aerated. Other: No significant soft tissue abnormalities are seen. CT CERVICAL SPINE FINDINGS Alignment: Normal. Skull base and vertebrae: No acute fracture. No primary bone lesion or focal pathologic process. Soft tissues and spinal canal: No prevertebral fluid or swelling. No visible canal  hematoma. Disc levels: The patient  is status post anterior cervical spinal fusion at C5-C7, with mild underlying facet disease. Mild degenerative change is noted about the dens. Upper chest: Scattered calcification is noted along the carotid bifurcations bilaterally, more prominent on the left. The visualized portions of the thyroid gland are unremarkable. Mild scarring is noted at the lung apices. Other: No additional soft tissue abnormalities are seen. IMPRESSION: 1. No evidence of traumatic intracranial injury or fracture. 2. No evidence of fracture or subluxation along the cervical spine. 3. Moderate cortical volume loss and scattered small vessel ischemic microangiopathy. 4. Chronic lacunar infarcts at the basal ganglia bilaterally. Chronic infarct at the right occipital lobe, with associated encephalomalacia. 5. Status post anterior cervical spinal fusion at C5-C7, with minimal underlying degenerative change. 6. Scattered calcification along the carotid bifurcations, more prominent on the left. Carotid ultrasound would be helpful for further evaluation, when and as deemed clinically appropriate. Electronically Signed   By: Roanna Raider M.D.   On: 02/11/2017 22:35   Ct Cervical Spine Wo Contrast  Result Date: 02/11/2017 CLINICAL DATA:  Acute onset of dizziness, with fall to the right side. Right-sided head and neck pain. Initial encounter. EXAM: CT HEAD WITHOUT CONTRAST CT CERVICAL SPINE WITHOUT CONTRAST TECHNIQUE: Multidetector CT imaging of the head and cervical spine was performed following the standard protocol without intravenous contrast. Multiplanar CT image reconstructions of the cervical spine were also generated. COMPARISON:  CT of the head and cervical spine performed 12/29/2016 FINDINGS: CT HEAD FINDINGS Brain: No evidence of acute infarction, hemorrhage, hydrocephalus, extra-axial collection or mass lesion/mass effect. Prominence of the ventricles and sulci reflects moderate cortical volume  loss. Mild cerebellar atrophy is noted. Scattered periventricular and subcortical white matter change likely reflects small vessel ischemic microangiopathy. Chronic lacunar infarcts are noted at the basal ganglia bilaterally. A chronic infarct is noted at the right occipital lobe, with associated encephalomalacia. The brainstem and fourth ventricle are within normal limits. No mass effect or midline shift is seen. Vascular: No hyperdense vessel or unexpected calcification. Skull: There is no evidence of fracture; visualized osseous structures are unremarkable in appearance. Sinuses/Orbits: The visualized portions of the orbits are within normal limits. The paranasal sinuses and mastoid air cells are well-aerated. Other: No significant soft tissue abnormalities are seen. CT CERVICAL SPINE FINDINGS Alignment: Normal. Skull base and vertebrae: No acute fracture. No primary bone lesion or focal pathologic process. Soft tissues and spinal canal: No prevertebral fluid or swelling. No visible canal hematoma. Disc levels: The patient is status post anterior cervical spinal fusion at C5-C7, with mild underlying facet disease. Mild degenerative change is noted about the dens. Upper chest: Scattered calcification is noted along the carotid bifurcations bilaterally, more prominent on the left. The visualized portions of the thyroid gland are unremarkable. Mild scarring is noted at the lung apices. Other: No additional soft tissue abnormalities are seen. IMPRESSION: 1. No evidence of traumatic intracranial injury or fracture. 2. No evidence of fracture or subluxation along the cervical spine. 3. Moderate cortical volume loss and scattered small vessel ischemic microangiopathy. 4. Chronic lacunar infarcts at the basal ganglia bilaterally. Chronic infarct at the right occipital lobe, with associated encephalomalacia. 5. Status post anterior cervical spinal fusion at C5-C7, with minimal underlying degenerative change. 6. Scattered  calcification along the carotid bifurcations, more prominent on the left. Carotid ultrasound would be helpful for further evaluation, when and as deemed clinically appropriate. Electronically Signed   By: Roanna Raider M.D.   On: 02/11/2017 22:35   Ct Angio Chest/abd/pel  For Dissection W And/or Wo Contrast  Result Date: 02/11/2017 CLINICAL DATA:  Acute onset of dizziness. Status post fall. Follow-up known thoracoabdominal aortic aneurysm. Initial encounter. EXAM: CT ANGIOGRAPHY CHEST, ABDOMEN AND PELVIS TECHNIQUE: Multidetector CT imaging through the chest, abdomen and pelvis was performed using the standard protocol during bolus administration of intravenous contrast. Multiplanar reconstructed images and MIPs were obtained and reviewed to evaluate the vascular anatomy. CONTRAST:  90 mL of Isovue 370 IV contrast COMPARISON:  CT of the chest performed 12/29/2016, and CTA of the abdomen and pelvis performed 01/29/2017 FINDINGS: CTA CHEST FINDINGS Cardiovascular: There is no evidence of thoracic aortic aneurysm. There is no evidence of aortic dissection. Scattered calcification is noted along the aortic arch and proximal great vessels, and along the descending thoracic aorta, with minimal associated mural thrombus but no significant luminal narrowing. The heart remains normal in size. The patient is status post median sternotomy, with evidence of prior CABG. Mediastinum/Nodes: No mediastinal lymphadenopathy is seen. No pericardial effusion is identified. The visualized portions of the thyroid gland are unremarkable. No axillary lymphadenopathy is appreciated. Lungs/Pleura: Scarring is noted at the lung apices. Bilateral emphysema is noted, with underlying scarring and peripheral nodularity. No pleural effusion or pneumothorax is seen. No dominant mass is identified. Musculoskeletal: No acute osseous abnormalities are identified. A compression fracture of vertebral body T9 appears slightly worsened from the prior  study. The visualized musculature is unremarkable in appearance. Review of the MIP images confirms the above findings. CTA ABDOMEN AND PELVIS FINDINGS VASCULAR Aorta: There is aneurysmal dilatation of the infrarenal abdominal aorta to 5.6 cm in AP dimension and 5.8 cm in transverse dimension, relatively stable from the recent prior CTA. Underlying thrombosis of the aneurysm sac is relatively stable in appearance. The aneurysm resolves just proximal to the aortic bifurcation. No soft tissue inflammation is seen. Scattered calcification is noted along the abdominal aorta. Celiac: The celiac trunk appears grossly intact. SMA: Mild calcification is noted along the superior mesenteric artery, though it remains grossly intact. Renals: There is calcification along the proximal renal arteries bilaterally, with luminal narrowing along the proximal right renal artery. Would correlate for evidence of refractory hypertension. IMA: The inferior mesenteric artery is not visualized. Inflow: Scattered calcification is noted along the common, internal and external iliac arteries bilaterally. Scattered calcification is seen at the common femoral arteries bilaterally. Veins: Visualized venous structures are grossly unremarkable in appearance. Review of the MIP images confirms the above findings. NON-VASCULAR Hepatobiliary: The liver is unremarkable in appearance. The gallbladder is unremarkable in appearance. The common bile duct remains normal in caliber. Pancreas: The pancreas is within normal limits. Spleen: The spleen is unremarkable in appearance. Adrenals/Urinary Tract: The adrenal glands are unremarkable in appearance. There is moderate left renal atrophy, and mild bilateral renal scarring. There is chronic right-sided renal pelvicaliectasis, without significant hydronephrosis. No distal obstructing stone is identified. No significant perinephric stranding is appreciated. Stomach/Bowel: No significant small bowel abnormalities  are seen. The stomach is grossly unremarkable in appearance. The appendix is not definitely seen; there is no evidence for appendicitis. Scattered diverticulosis is noted along the sigmoid colon. Postoperative change is noted at the distal sigmoid colon. There is no evidence of diverticulitis at this time. Lymphatic: No retroperitoneal or pelvic sidewall lymphadenopathy is seen. Reproductive: The bladder is mildly distended and grossly unremarkable in appearance. The patient is status post hysterectomy. No suspicious adnexal masses are seen. Other: No additional soft tissue abnormalities are seen. Musculoskeletal: No acute osseous abnormalities are identified.  Pins are noted at the left femoral neck. The visualized musculature is unremarkable in appearance. Review of the MIP images confirms the above findings. IMPRESSION: 1. Infrarenal abdominal aortic aneurysm measures approximately 5.6 cm in AP dimension and 5.8 cm in transverse dimension, relatively stable from the recent prior CTA. Underlying thrombosis of the aneurysm sac is also stable in appearance. The aneurysm resolves just proximal to the aortic bifurcation. 2. No evidence of thoracic aortic aneurysm. No evidence of aortic dissection. 3. Scattered aortic atherosclerosis. 4. Bilateral emphysema, with underlying scarring and peripheral nodularity. No dominant mass seen. Scarring at the lung apices. 5. Slightly worsened compression fracture vertebral body T9, in comparison to the prior CT of the chest. 6. Moderate left renal atrophy, and mild bilateral renal scarring. 7. Scattered diverticulosis along the sigmoid colon, without evidence of diverticulitis. Electronically Signed   By: Roanna Raider M.D.   On: 02/11/2017 22:51    Procedures Procedures (including critical care time)  Medications Ordered in ED Medications  sodium chloride 0.9 % bolus 1,000 mL (0 mLs Intravenous Stopped 02/11/17 2217)  iopamidol (ISOVUE-370) 76 % injection (100 mLs   Contrast Given 02/11/17 2144)  meclizine (ANTIVERT) tablet 25 mg (25 mg Oral Given 02/11/17 2303)  traMADol (ULTRAM) tablet 50 mg (50 mg Oral Given 02/11/17 2303)     Initial Impression / Assessment and Plan / ED Course  I have reviewed the triage vital signs and the nursing notes.  Pertinent labs & imaging results that were available during my care of the patient were reviewed by me and considered in my medical decision making (see chart for details).     82 year old female with history of coronary artery disease post CABG, AAA, chronic kidney disease, recent admissions with concern for syncope and fall with T9 fracture, urinary tract infection, presents with concern for dizziness, fall with headache, right sided rib pain.  After trauma of fall, CT head, CSpine and C/A/P done which showed no significant traumatic injuries. T9 compression fracture moderately worsened.  Given near-syncope and known AAA, CTA done showed no change in infrarenal aortic aneurysm.     Hgb stable. No significant electrolyte abnormalities. No sign of arrhythmia on telemetry. Given IV fluids without improvement in dizziness.  Her CT head shows chronic infarcts. Given her risk factors, difficulty ambulating and dizziness, have concern that her dizziness may represent central etiology.  Will order MR brain and MRA head and neck. Given meclizine although history not as consistent with peripheral vertigo.    Called for admission for continued evaluation and treatment of vertigo, near-syncope with telemetry monitoring.  She describes these symptoms as acute and beginning today.  Orthostatic blood pressures change from 150 to 99 and patient had symptoms of near syncope.  Discussed with hospitalist and given orthostatic changes, will admit for hydration however she recommends canceling MR imaging.  Given she does not have any other signs of neurologic deficits, feel continued monitoring, treatment is reasonable.   Final Clinical  Impressions(s) / ED Diagnoses   Final diagnoses:  Fall, initial encounter  Dizziness    New Prescriptions New Prescriptions   No medications on file     Alvira Monday, MD 02/11/17 2327

## 2017-02-11 NOTE — ED Triage Notes (Signed)
Per EMS pt from [facility] pt was walking to wheelchair became dizzy, slipped and fell, no LOC, recent L12 fx is in rehab, c/o pain back R side head and lower back pain, bilateral hip pain,

## 2017-02-12 DIAGNOSIS — N183 Chronic kidney disease, stage 3 (moderate): Secondary | ICD-10-CM | POA: Diagnosis present

## 2017-02-12 DIAGNOSIS — I252 Old myocardial infarction: Secondary | ICD-10-CM | POA: Diagnosis not present

## 2017-02-12 DIAGNOSIS — I714 Abdominal aortic aneurysm, without rupture: Secondary | ICD-10-CM | POA: Diagnosis present

## 2017-02-12 DIAGNOSIS — Z96641 Presence of right artificial hip joint: Secondary | ICD-10-CM | POA: Diagnosis present

## 2017-02-12 DIAGNOSIS — J439 Emphysema, unspecified: Secondary | ICD-10-CM | POA: Diagnosis present

## 2017-02-12 DIAGNOSIS — F39 Unspecified mood [affective] disorder: Secondary | ICD-10-CM | POA: Diagnosis present

## 2017-02-12 DIAGNOSIS — E785 Hyperlipidemia, unspecified: Secondary | ICD-10-CM | POA: Diagnosis present

## 2017-02-12 DIAGNOSIS — I4891 Unspecified atrial fibrillation: Secondary | ICD-10-CM | POA: Diagnosis present

## 2017-02-12 DIAGNOSIS — I420 Dilated cardiomyopathy: Secondary | ICD-10-CM | POA: Diagnosis present

## 2017-02-12 DIAGNOSIS — Z90710 Acquired absence of both cervix and uterus: Secondary | ICD-10-CM | POA: Diagnosis not present

## 2017-02-12 DIAGNOSIS — Z951 Presence of aortocoronary bypass graft: Secondary | ICD-10-CM | POA: Diagnosis not present

## 2017-02-12 DIAGNOSIS — Z823 Family history of stroke: Secondary | ICD-10-CM | POA: Diagnosis not present

## 2017-02-12 DIAGNOSIS — W1830XA Fall on same level, unspecified, initial encounter: Secondary | ICD-10-CM | POA: Diagnosis present

## 2017-02-12 DIAGNOSIS — I5032 Chronic diastolic (congestive) heart failure: Secondary | ICD-10-CM | POA: Diagnosis present

## 2017-02-12 DIAGNOSIS — Z7951 Long term (current) use of inhaled steroids: Secondary | ICD-10-CM | POA: Diagnosis not present

## 2017-02-12 DIAGNOSIS — I2510 Atherosclerotic heart disease of native coronary artery without angina pectoris: Secondary | ICD-10-CM | POA: Diagnosis present

## 2017-02-12 DIAGNOSIS — Z9049 Acquired absence of other specified parts of digestive tract: Secondary | ICD-10-CM | POA: Diagnosis not present

## 2017-02-12 DIAGNOSIS — Y92199 Unspecified place in other specified residential institution as the place of occurrence of the external cause: Secondary | ICD-10-CM | POA: Diagnosis not present

## 2017-02-12 DIAGNOSIS — Z832 Family history of diseases of the blood and blood-forming organs and certain disorders involving the immune mechanism: Secondary | ICD-10-CM | POA: Diagnosis not present

## 2017-02-12 DIAGNOSIS — Z7982 Long term (current) use of aspirin: Secondary | ICD-10-CM | POA: Diagnosis not present

## 2017-02-12 DIAGNOSIS — Z8673 Personal history of transient ischemic attack (TIA), and cerebral infarction without residual deficits: Secondary | ICD-10-CM | POA: Diagnosis not present

## 2017-02-12 DIAGNOSIS — I951 Orthostatic hypotension: Secondary | ICD-10-CM | POA: Diagnosis present

## 2017-02-12 DIAGNOSIS — I130 Hypertensive heart and chronic kidney disease with heart failure and stage 1 through stage 4 chronic kidney disease, or unspecified chronic kidney disease: Secondary | ICD-10-CM | POA: Diagnosis present

## 2017-02-12 DIAGNOSIS — R42 Dizziness and giddiness: Secondary | ICD-10-CM | POA: Diagnosis present

## 2017-02-12 LAB — CBC
HEMATOCRIT: 35 % — AB (ref 36.0–46.0)
HEMOGLOBIN: 11.3 g/dL — AB (ref 12.0–15.0)
MCH: 30 pg (ref 26.0–34.0)
MCHC: 32.3 g/dL (ref 30.0–36.0)
MCV: 92.8 fL (ref 78.0–100.0)
Platelets: 350 10*3/uL (ref 150–400)
RBC: 3.77 MIL/uL — AB (ref 3.87–5.11)
RDW: 13.3 % (ref 11.5–15.5)
WBC: 9.5 10*3/uL (ref 4.0–10.5)

## 2017-02-12 LAB — BASIC METABOLIC PANEL
ANION GAP: 13 (ref 5–15)
BUN: 12 mg/dL (ref 6–20)
CHLORIDE: 104 mmol/L (ref 101–111)
CO2: 20 mmol/L — AB (ref 22–32)
CREATININE: 0.9 mg/dL (ref 0.44–1.00)
Calcium: 9.1 mg/dL (ref 8.9–10.3)
GFR calc non Af Amer: 59 mL/min — ABNORMAL LOW (ref >60)
Glucose, Bld: 95 mg/dL (ref 65–99)
POTASSIUM: 3.8 mmol/L (ref 3.5–5.1)
Sodium: 137 mmol/L (ref 135–145)

## 2017-02-12 LAB — GLUCOSE, CAPILLARY
Glucose-Capillary: 94 mg/dL (ref 65–99)
Glucose-Capillary: 97 mg/dL (ref 65–99)

## 2017-02-12 MED ORDER — METHOCARBAMOL 500 MG PO TABS
500.0000 mg | ORAL_TABLET | Freq: Three times a day (TID) | ORAL | Status: DC | PRN
  Administered 2017-02-13: 500 mg via ORAL
  Filled 2017-02-12: qty 1

## 2017-02-12 MED ORDER — SODIUM CHLORIDE 0.9 % IV SOLN
INTRAVENOUS | Status: DC
  Administered 2017-02-12 – 2017-02-14 (×7): via INTRAVENOUS

## 2017-02-12 MED ORDER — SERTRALINE HCL 25 MG PO TABS
25.0000 mg | ORAL_TABLET | Freq: Every morning | ORAL | Status: DC
  Administered 2017-02-12 – 2017-02-14 (×3): 25 mg via ORAL
  Filled 2017-02-12 (×3): qty 1

## 2017-02-12 MED ORDER — ATORVASTATIN CALCIUM 40 MG PO TABS
40.0000 mg | ORAL_TABLET | Freq: Every day | ORAL | Status: DC
  Administered 2017-02-12 – 2017-02-14 (×3): 40 mg via ORAL
  Filled 2017-02-12 (×3): qty 1

## 2017-02-12 MED ORDER — ASPIRIN EC 81 MG PO TBEC
81.0000 mg | DELAYED_RELEASE_TABLET | Freq: Every day | ORAL | Status: DC
  Administered 2017-02-12 – 2017-02-14 (×3): 81 mg via ORAL
  Filled 2017-02-12 (×3): qty 1

## 2017-02-12 MED ORDER — FLUTICASONE PROPIONATE 50 MCG/ACT NA SUSP
1.0000 | Freq: Every day | NASAL | Status: DC
  Administered 2017-02-12 – 2017-02-14 (×2): 1 via NASAL
  Filled 2017-02-12: qty 16

## 2017-02-12 MED ORDER — ENSURE ENLIVE PO LIQD
237.0000 mL | Freq: Two times a day (BID) | ORAL | Status: DC
  Administered 2017-02-13 – 2017-02-14 (×4): 237 mL via ORAL

## 2017-02-12 MED ORDER — TRAMADOL HCL 50 MG PO TABS
50.0000 mg | ORAL_TABLET | Freq: Four times a day (QID) | ORAL | Status: DC | PRN
  Administered 2017-02-12 – 2017-02-13 (×3): 50 mg via ORAL
  Filled 2017-02-12 (×3): qty 1

## 2017-02-12 MED ORDER — OLANZAPINE 5 MG PO TABS
5.0000 mg | ORAL_TABLET | Freq: Every evening | ORAL | Status: DC
  Administered 2017-02-12 – 2017-02-13 (×2): 5 mg via ORAL
  Filled 2017-02-12 (×2): qty 1

## 2017-02-12 MED ORDER — SODIUM CHLORIDE 0.9 % IV BOLUS (SEPSIS)
500.0000 mL | Freq: Once | INTRAVENOUS | Status: AC
  Administered 2017-02-12: 500 mL via INTRAVENOUS

## 2017-02-12 NOTE — Progress Notes (Signed)
MD awrae of recent orthostatics. Notified in person. Will order a 500 bolus

## 2017-02-12 NOTE — Progress Notes (Addendum)
[facility] Progress Note  Patient: Tonya Payne OZH:[id number]   PCP: Rocky Morel, MD DOB: [birth year]   DOA: 02/11/2017   DOS: 02/12/2017   Date of Service: the patient was seen and examined on 02/12/2017  Subjective: Dizziness at rest has improved but becomes dizzy again when stands up. No nausea no vomiting no other focal deficit. No chest pain and abdominal pain no palpitation. No other recent change in medications reported by patient as well.  Brief hospital course: Pt. with PMH of recurrent dizziness, AAA, CAD; admitted on 02/11/2017, presented with complaint of dizziness, was found to have orthostatic hypotension. Currently further plan is continue IV hydration.  Assessment and Plan: 1. Orthostatic hypotension. Recurrent dizziness. Patient has multiple frequent episodes with medical fall, pelvic fractures due to dizziness. He remains orthostatically positive. No other acute abnormality on telemetry. We will continue with aggressive IV hydration and recheck orthostatics. PTOT consulted. But likely will back to SNF.  2. Chronic diastolic CHF. Echocardiogram done September 2018 shows preserved EF with diastolic dysfunction. Do not suspect that the patient is volume overloaded. We will be receiving actually IV fluids. Monitor.  3. Coronary disease S/P CABG. CABG done last year since then the frequency of patient's dizziness and fall has increased. Continue 81 mg aspirin Lopressor is currently on hold.  4. Mood disorder. Continuing Zoloft.  5.AAA. 5.6 cm in size. Has been irritated by vascular surgery as an outpatient as well as inpatient, currently no indication for surgical workup. Patient being evaluated for endovascular repair.  Diet: Cardiac diet DVT Prophylaxis: subcutaneous Heparin  Advance goals of care discussion: full code  Family Communication: no family was present at bedside, at the time of interview.  Disposition:  Discharge to  SNF.  Consultants: none Procedures: none  Antibiotics: Anti-infectives    None       Objective: Physical Exam: Vitals:   02/12/17 0050 02/12/17 0455 02/12/17 1015 02/12/17 1313  BP: (!) 146/80 (!) 159/71  116/74  Pulse: 85 87  100  Resp: Temp: 98.1 F (36.7 C) 97.9 F (36.6 C) 98.4 F (36.9 C) 98.4 F (36.9 C)  TempSrc: Oral Oral Oral Oral  SpO2: 97% 98% 97% 97%  Weight:      Height:        Intake/Output Summary (Last 24 hours) at 02/12/17 1508 Last data filed at 02/12/17 1317  Gross per 24 hour  Intake             1450 ml  Output             1700 ml  Net             -250 ml   Filed Weights   02/11/17 1903  Weight: 53.5 kg (118 lb)   General: Alert, Awake and Oriented to Time, Place and Person. Appear in mild distress, affect appropriate Eyes: PERRL, Conjunctiva normal ENT: Oral Mucosa clear moist. Neck: no JVD, no Abnormal Mass Or lumps Cardiovascular: S1 and S2 Present, aortic systolic  Murmur, Peripheral Pulses Present Respiratory: normal respiratory effort, Bilateral Air entry equal and Decreased, no use of accessory muscle, Clear to Auscultation, no Crackles, no wheezes Abdomen: Bowel Sound present, Soft and no tenderness, no hernia Skin: no redness, no Rash, no induration Extremities: no Pedal edema, no calf tenderness Neurologic: Grossly no focal neuro deficit. Bilaterally Equal motor strength  Data Reviewed: CBC:  Recent Labs Lab 02/11/17 1910 02/12/17 0421  WBC 9.5 9.5  HGB 11.9* 11.3*  HCT 36.5 35.0*  MCV 93.1 92.8  PLT 364 350   Basic Metabolic Panel:  Recent Labs Lab 02/11/17 1910 02/12/17 0421  NA 134* 137  K 4.0 3.8  CL 100* 104  CO2 24 20*  GLUCOSE 127* 95  BUN 14 12  CREATININE 1.08* 0.90  CALCIUM 9.5 9.1    Liver Function Tests: No results for input(s): AST, ALT, ALKPHOS, BILITOT, PROT, ALBUMIN in the last 168 hours. No results for input(s): LIPASE, AMYLASE in the last 168 hours. No results for input(s):  AMMONIA in the last 168 hours. Coagulation Profile: No results for input(s): INR, PROTIME in the last 168 hours. Cardiac Enzymes: No results for input(s): CKTOTAL, CKMB, CKMBINDEX, TROPONINI in the last 168 hours. BNP (last 3 results) No results for input(s): PROBNP in the last 8760 hours. CBG:  Recent Labs Lab 02/11/17 2235 02/12/17 0047  GLUCAP 104* 97   Studies: Ct Head Wo Contrast  Result Date: 02/11/2017 CLINICAL DATA:  Acute onset of dizziness, with fall to the right side. Right-sided head and neck pain. Initial encounter. EXAM: CT HEAD WITHOUT CONTRAST CT CERVICAL SPINE WITHOUT CONTRAST TECHNIQUE: Multidetector CT imaging of the head and cervical spine was performed following the standard protocol without intravenous contrast. Multiplanar CT image reconstructions of the cervical spine were also generated. COMPARISON:  CT of the head and cervical spine performed 12/29/2016 FINDINGS: CT HEAD FINDINGS Brain: No evidence of acute infarction, hemorrhage, hydrocephalus, extra-axial collection or mass lesion/mass effect. Prominence of the ventricles and sulci reflects moderate cortical volume loss. Mild cerebellar atrophy is noted. Scattered periventricular and subcortical white matter change likely reflects small vessel ischemic microangiopathy. Chronic lacunar infarcts are noted at the basal ganglia bilaterally. A chronic infarct is noted at the right occipital lobe, with associated encephalomalacia. The brainstem and fourth ventricle are within normal limits. No mass effect or midline shift is seen. Vascular: No hyperdense vessel or unexpected calcification. Skull: There is no evidence of fracture; visualized osseous structures are unremarkable in appearance. Sinuses/Orbits: The visualized portions of the orbits are within normal limits. The paranasal sinuses and mastoid air cells are well-aerated. Other: No significant soft tissue abnormalities are seen. CT CERVICAL SPINE FINDINGS Alignment:  Normal. Skull base and vertebrae: No acute fracture. No primary bone lesion or focal pathologic process. Soft tissues and spinal canal: No prevertebral fluid or swelling. No visible canal hematoma. Disc levels: The patient is status post anterior cervical spinal fusion at C5-C7, with mild underlying facet disease. Mild degenerative change is noted about the dens. Upper chest: Scattered calcification is noted along the carotid bifurcations bilaterally, more prominent on the left. The visualized portions of the thyroid gland are unremarkable. Mild scarring is noted at the lung apices. Other: No additional soft tissue abnormalities are seen. IMPRESSION: 1. No evidence of traumatic intracranial injury or fracture. 2. No evidence of fracture or subluxation along the cervical spine. 3. Moderate cortical volume loss and scattered small vessel ischemic microangiopathy. 4. Chronic lacunar infarcts at the basal ganglia bilaterally. Chronic infarct at the right occipital lobe, with associated encephalomalacia. 5. Status post anterior cervical spinal fusion at C5-C7, with minimal underlying degenerative change. 6. Scattered calcification along the carotid bifurcations, more prominent on the left. Carotid ultrasound would be helpful for further evaluation, when and as deemed clinically appropriate. Electronically Signed   By: Roanna Raider M.D.   On: 02/11/2017 22:35   Ct Cervical Spine Wo Contrast  Result Date: 02/11/2017 CLINICAL DATA:  Acute onset of dizziness, with fall  to the right side. Right-sided head and neck pain. Initial encounter. EXAM: CT HEAD WITHOUT CONTRAST CT CERVICAL SPINE WITHOUT CONTRAST TECHNIQUE: Multidetector CT imaging of the head and cervical spine was performed following the standard protocol without intravenous contrast. Multiplanar CT image reconstructions of the cervical spine were also generated. COMPARISON:  CT of the head and cervical spine performed 12/29/2016 FINDINGS: CT HEAD FINDINGS  Brain: No evidence of acute infarction, hemorrhage, hydrocephalus, extra-axial collection or mass lesion/mass effect. Prominence of the ventricles and sulci reflects moderate cortical volume loss. Mild cerebellar atrophy is noted. Scattered periventricular and subcortical white matter change likely reflects small vessel ischemic microangiopathy. Chronic lacunar infarcts are noted at the basal ganglia bilaterally. A chronic infarct is noted at the right occipital lobe, with associated encephalomalacia. The brainstem and fourth ventricle are within normal limits. No mass effect or midline shift is seen. Vascular: No hyperdense vessel or unexpected calcification. Skull: There is no evidence of fracture; visualized osseous structures are unremarkable in appearance. Sinuses/Orbits: The visualized portions of the orbits are within normal limits. The paranasal sinuses and mastoid air cells are well-aerated. Other: No significant soft tissue abnormalities are seen. CT CERVICAL SPINE FINDINGS Alignment: Normal. Skull base and vertebrae: No acute fracture. No primary bone lesion or focal pathologic process. Soft tissues and spinal canal: No prevertebral fluid or swelling. No visible canal hematoma. Disc levels: The patient is status post anterior cervical spinal fusion at C5-C7, with mild underlying facet disease. Mild degenerative change is noted about the dens. Upper chest: Scattered calcification is noted along the carotid bifurcations bilaterally, more prominent on the left. The visualized portions of the thyroid gland are unremarkable. Mild scarring is noted at the lung apices. Other: No additional soft tissue abnormalities are seen. IMPRESSION: 1. No evidence of traumatic intracranial injury or fracture. 2. No evidence of fracture or subluxation along the cervical spine. 3. Moderate cortical volume loss and scattered small vessel ischemic microangiopathy. 4. Chronic lacunar infarcts at the basal ganglia bilaterally.  Chronic infarct at the right occipital lobe, with associated encephalomalacia. 5. Status post anterior cervical spinal fusion at C5-C7, with minimal underlying degenerative change. 6. Scattered calcification along the carotid bifurcations, more prominent on the left. Carotid ultrasound would be helpful for further evaluation, when and as deemed clinically appropriate. Electronically Signed   By: Roanna Raider M.D.   On: 02/11/2017 22:35   Ct Angio Chest/abd/pel For Dissection W And/or Wo Contrast  Result Date: 02/11/2017 CLINICAL DATA:  Acute onset of dizziness. Status post fall. Follow-up known thoracoabdominal aortic aneurysm. Initial encounter. EXAM: CT ANGIOGRAPHY CHEST, ABDOMEN AND PELVIS TECHNIQUE: Multidetector CT imaging through the chest, abdomen and pelvis was performed using the standard protocol during bolus administration of intravenous contrast. Multiplanar reconstructed images and MIPs were obtained and reviewed to evaluate the vascular anatomy. CONTRAST:  90 mL of Isovue 370 IV contrast COMPARISON:  CT of the chest performed 12/29/2016, and CTA of the abdomen and pelvis performed 01/29/2017 FINDINGS: CTA CHEST FINDINGS Cardiovascular: There is no evidence of thoracic aortic aneurysm. There is no evidence of aortic dissection. Scattered calcification is noted along the aortic arch and proximal great vessels, and along the descending thoracic aorta, with minimal associated mural thrombus but no significant luminal narrowing. The heart remains normal in size. The patient is status post median sternotomy, with evidence of prior CABG. Mediastinum/Nodes: No mediastinal lymphadenopathy is seen. No pericardial effusion is identified. The visualized portions of the thyroid gland are unremarkable. No axillary lymphadenopathy is appreciated. Lungs/Pleura:  Scarring is noted at the lung apices. Bilateral emphysema is noted, with underlying scarring and peripheral nodularity. No pleural effusion or  pneumothorax is seen. No dominant mass is identified. Musculoskeletal: No acute osseous abnormalities are identified. A compression fracture of vertebral body T9 appears slightly worsened from the prior study. The visualized musculature is unremarkable in appearance. Review of the MIP images confirms the above findings. CTA ABDOMEN AND PELVIS FINDINGS VASCULAR Aorta: There is aneurysmal dilatation of the infrarenal abdominal aorta to 5.6 cm in AP dimension and 5.8 cm in transverse dimension, relatively stable from the recent prior CTA. Underlying thrombosis of the aneurysm sac is relatively stable in appearance. The aneurysm resolves just proximal to the aortic bifurcation. No soft tissue inflammation is seen. Scattered calcification is noted along the abdominal aorta. Celiac: The celiac trunk appears grossly intact. SMA: Mild calcification is noted along the superior mesenteric artery, though it remains grossly intact. Renals: There is calcification along the proximal renal arteries bilaterally, with luminal narrowing along the proximal right renal artery. Would correlate for evidence of refractory hypertension. IMA: The inferior mesenteric artery is not visualized. Inflow: Scattered calcification is noted along the common, internal and external iliac arteries bilaterally. Scattered calcification is seen at the common femoral arteries bilaterally. Veins: Visualized venous structures are grossly unremarkable in appearance. Review of the MIP images confirms the above findings. NON-VASCULAR Hepatobiliary: The liver is unremarkable in appearance. The gallbladder is unremarkable in appearance. The common bile duct remains normal in caliber. Pancreas: The pancreas is within normal limits. Spleen: The spleen is unremarkable in appearance. Adrenals/Urinary Tract: The adrenal glands are unremarkable in appearance. There is moderate left renal atrophy, and mild bilateral renal scarring. There is chronic right-sided renal  pelvicaliectasis, without significant hydronephrosis. No distal obstructing stone is identified. No significant perinephric stranding is appreciated. Stomach/Bowel: No significant small bowel abnormalities are seen. The stomach is grossly unremarkable in appearance. The appendix is not definitely seen; there is no evidence for appendicitis. Scattered diverticulosis is noted along the sigmoid colon. Postoperative change is noted at the distal sigmoid colon. There is no evidence of diverticulitis at this time. Lymphatic: No retroperitoneal or pelvic sidewall lymphadenopathy is seen. Reproductive: The bladder is mildly distended and grossly unremarkable in appearance. The patient is status post hysterectomy. No suspicious adnexal masses are seen. Other: No additional soft tissue abnormalities are seen. Musculoskeletal: No acute osseous abnormalities are identified. Pins are noted at the left femoral neck. The visualized musculature is unremarkable in appearance. Review of the MIP images confirms the above findings. IMPRESSION: 1. Infrarenal abdominal aortic aneurysm measures approximately 5.6 cm in AP dimension and 5.8 cm in transverse dimension, relatively stable from the recent prior CTA. Underlying thrombosis of the aneurysm sac is also stable in appearance. The aneurysm resolves just proximal to the aortic bifurcation. 2. No evidence of thoracic aortic aneurysm. No evidence of aortic dissection. 3. Scattered aortic atherosclerosis. 4. Bilateral emphysema, with underlying scarring and peripheral nodularity. No dominant mass seen. Scarring at the lung apices. 5. Slightly worsened compression fracture vertebral body T9, in comparison to the prior CT of the chest. 6. Moderate left renal atrophy, and mild bilateral renal scarring. 7. Scattered diverticulosis along the sigmoid colon, without evidence of diverticulitis. Electronically Signed   By: Roanna Raider M.D.   On: 02/11/2017 22:51    Scheduled Meds: . aspirin  EC  81 mg Oral Daily  . OLANZapine  5 mg Oral QPM  . sertraline  25 mg Oral q morning -  10a   Continuous Infusions: . sodium chloride 150 mL/hr at 02/12/17 1118   PRN Meds:   Time spent: 35 minutes  Author: Lynden Oxford, MD [facility] Pager: [phone] 02/12/2017 3:08 PM  If 7PM-7AM, please contact night-coverage at [email or web address], password [facility]

## 2017-02-13 LAB — GLUCOSE, CAPILLARY
GLUCOSE-CAPILLARY: 119 mg/dL — AB (ref 65–99)
GLUCOSE-CAPILLARY: 184 mg/dL — AB (ref 65–99)
GLUCOSE-CAPILLARY: 158 mg/dL — AB (ref 65–99)
GLUCOSE-CAPILLARY: 109 mg/dL — AB (ref 65–99)

## 2017-02-13 LAB — CBC WITH DIFFERENTIAL/PLATELET
BASOS PCT: 0 %
Basophils Absolute: 0 10*3/uL (ref 0.0–0.1)
EOS ABS: 0.2 10*3/uL (ref 0.0–0.7)
Eosinophils Relative: 2 %
HCT: 35.8 % — ABNORMAL LOW (ref 36.0–46.0)
HEMOGLOBIN: 11.4 g/dL — AB (ref 12.0–15.0)
LYMPHS ABS: 1.6 10*3/uL (ref 0.7–4.0)
Lymphocytes Relative: 21 %
MCH: 29.9 pg (ref 26.0–34.0)
MCHC: 31.8 g/dL (ref 30.0–36.0)
MCV: 94 fL (ref 78.0–100.0)
Monocytes Absolute: 0.5 10*3/uL (ref 0.1–1.0)
Monocytes Relative: 7 %
NEUTROS PCT: 70 %
Neutro Abs: 5.3 10*3/uL (ref 1.7–7.7)
Platelets: 338 10*3/uL (ref 150–400)
RBC: 3.81 MIL/uL — AB (ref 3.87–5.11)
RDW: 13.7 % (ref 11.5–15.5)
WBC: 7.6 10*3/uL (ref 4.0–10.5)

## 2017-02-13 LAB — COMPREHENSIVE METABOLIC PANEL
ALT: 14 U/L (ref 14–54)
ANION GAP: 10 (ref 5–15)
AST: 29 U/L (ref 15–41)
Albumin: 3.1 g/dL — ABNORMAL LOW (ref 3.5–5.0)
Alkaline Phosphatase: 80 U/L (ref 38–126)
BUN: 5 mg/dL — ABNORMAL LOW (ref 6–20)
CALCIUM: 8.7 mg/dL — AB (ref 8.9–10.3)
CHLORIDE: 108 mmol/L (ref 101–111)
CO2: 22 mmol/L (ref 22–32)
CREATININE: 1.05 mg/dL — AB (ref 0.44–1.00)
GFR, EST AFRICAN AMERICAN: 57 mL/min — AB (ref >60)
GFR, EST NON AFRICAN AMERICAN: 49 mL/min — AB (ref >60)
Glucose, Bld: 137 mg/dL — ABNORMAL HIGH (ref 65–99)
Potassium: 3.5 mmol/L (ref 3.5–5.1)
Sodium: 140 mmol/L (ref 135–145)
Total Bilirubin: 0.6 mg/dL (ref 0.3–1.2)
Total Protein: 6.1 g/dL — ABNORMAL LOW (ref 6.5–8.1)

## 2017-02-13 LAB — MAGNESIUM: MAGNESIUM: 1.4 mg/dL — AB (ref 1.7–2.4)

## 2017-02-13 MED ORDER — FLUDROCORTISONE ACETATE 0.1 MG PO TABS
0.2000 mg | ORAL_TABLET | Freq: Every day | ORAL | Status: DC
  Administered 2017-02-13 – 2017-02-14 (×2): 0.2 mg via ORAL
  Filled 2017-02-13 (×2): qty 2

## 2017-02-13 NOTE — Clinical Social Work Note (Signed)
Clinical Social Work Assessment  Patient Details  Name: Tonya Payne MRN: [id number] Date of Birth: 1935-03-03  Date of referral:  02/13/17               Reason for consult:  Facility Placement                Permission sought to share information with:  Oceanographer granted to share information::  Yes, Verbal Permission Granted  Name::        Agency::  [facility]  Relationship::     Contact Information:     Housing/Transportation Living arrangements for the past 2 months:  Skilled Building surveyor of Information:  Patient Patient Interpreter Needed:  None Criminal Activity/Legal Involvement Pertinent to Current Situation/Hospitalization:  No - Comment as needed Significant Relationships:  Adult Children Lives with:  Facility Resident Do you feel safe going back to the place where you live?  Yes Need for family participation in patient care:  No (Coment)  Care giving concerns:  CSW received consult regarding discharge planning. Patient reported that she arrived to hospital from [facility]. She would like to return there at discharge. CSW to continue to follow and assist with discharge planning needs.   Social Worker assessment / plan:  CSW spoke with patient concerning return to [facility].  Employment status:  Retired Health and safety inspector:  Medicare PT Recommendations:  Not assessed at this time Information / Referral to community resources:  Skilled Nursing Facility  Patient/Family's Response to care:  Patient reported agreement with discharge plan. Will need PTAR. She stated she hated to have to keep returning to the hospital and that she has no clothes with her. Her son is working. CSW assured patient that hospital has a transport gown she can wear.   Patient/Family's Understanding of and Emotional Response to Diagnosis, Current Treatment, and Prognosis:  Patient/family is realistic regarding therapy needs and expressed being  hopeful for return to SNF placement. Patient expressed understanding of CSW role and discharge process as well as medical condition. No questions/concerns about plan or treatment.    Emotional Assessment Appearance:  Appears stated age Attitude/Demeanor/Rapport:  Other (Appropriate) Affect (typically observed):  Accepting, Appropriate Orientation:  Oriented to Self, Oriented to Situation, Oriented to Place, Oriented to  Time Alcohol / Substance use:  Not Applicable Psych involvement (Current and /or in the community):  No (Comment)  Discharge Needs  Concerns to be addressed:  Care Coordination Readmission within the last 30 days:  Yes Current discharge risk:  None Barriers to Discharge:  Continued Medical Work up   [employer], LCSWA 02/13/2017, 11:19 AM

## 2017-02-13 NOTE — [place] FL2 (Signed)
[place] MEDICAID FL2 LEVEL OF CARE SCREENING TOOL     IDENTIFICATION  Patient Name: Tonya Payne Birthdate: 06/03/1934 Sex: female Admission Date (Current Location): 02/11/2017  [facility] and [place] Number:  Producer, television/film/video and Address:  [facility] [place], [address], [place], [place] [address]      Provider Number: [id number]  Attending Physician Name and Address:  Rolly Salter, MD  Relative Name and Phone Number:  Srinivasan,Timothy D. Son   [phone] or Dugue-Wright,Valie Relative [phone]  [phone]     Current Level of Care: Hospital Recommended Level of Care: Skilled Nursing Facility Prior Approval Number:    Date Approved/Denied:   PASRR Number: [id number]  Discharge Plan: SNF    Current Diagnoses: Patient Active Problem List   Diagnosis Date Noted  . Constipation 02/02/2017  . Hypomagnesemia 01/31/2017  . Orthostatic hypotension 01/31/2017  . Syncope 01/29/2017  . Nausea vomiting and diarrhea 01/29/2017  . Hypertensive urgency 01/29/2017  . COPD (chronic obstructive pulmonary disease) (HCC) 12/30/2016  . Closed compression fracture of thoracic vertebra (HCC) 12/30/2016  . Abdominal pain 12/30/2016  . Alcohol use 12/30/2016  . CAD (coronary artery disease) 12/30/2016  . Scalp laceration   . Acute renal failure superimposed on stage 3 chronic kidney disease (HCC)   . Dehydration   . Physical deconditioning   . Pressure injury of skin 11/12/2016  . CKD (chronic kidney disease), stage III (HCC) 09/24/2016  . Closed left hip fracture, with routine healing, subsequent encounter 09/23/2016  . Hyponatremia 09/23/2016  . Hypertension 09/23/2016  . Left displaced femoral neck fracture (HCC)   . Closed fracture of multiple pubic rami, right, sequela 02/27/2016  . Displaced fracture of neck of right second metacarpal bone with routine healing 02/27/2016  . Protein-calorie malnutrition, severe (HCC) 02/22/2016  .  Orthostatic dizziness 02/22/2016  . Postoperative atrial fibrillation (HCC) 02/22/2016  . Fall   . Hand fracture   . Pelvic fracture (HCC) 01/26/2016  . AAA (abdominal aortic aneurysm) (HCC) 11/30/2015  . Post PTCA   . S/P CABG x 3 10/16/2015  . Coronary artery disease involving native coronary artery with angina pectoris (HCC) 10/08/2015  . Congestive dilated cardiomyopathy (HCC) 10/08/2015  . Hyperlipidemia LDL goal <70 10/08/2015  . E. coli UTI (urinary tract infection) 10/06/2015  . Myocardial infarction involving left anterior descending (LAD) coronary artery (HCC) 10/06/2015    Orientation RESPIRATION BLADDER Height & Weight     Time, Situation, Place, Self  Normal Continent, External catheter Weight: 53.5 kg (118 lb) Height:  5' (152.4 cm)  BEHAVIORAL SYMPTOMS/MOOD NEUROLOGICAL BOWEL NUTRITION STATUS      Continent Diet (Please DC Summary)  AMBULATORY STATUS COMMUNICATION OF NEEDS Skin   Limited Assist Verbally PU Stage and Appropriate Care (Stage I on Heel)                       Personal Care Assistance Level of Assistance  Bathing, Feeding, Dressing Bathing Assistance: Limited assistance Feeding assistance: Independent Dressing Assistance: Limited assistance     Functional Limitations Info  Sight, Hearing, Speech Sight Info: Adequate Hearing Info: Adequate Speech Info: Adequate    SPECIAL CARE FACTORS FREQUENCY  PT (By licensed PT)     PT Frequency: not yet assessed              Contractures Contractures Info: Not present    Additional Factors Info  Code Status, Allergies, Psychotropic Code Status Info: DNR Allergies Info: NKA Psychotropic  Info: Zoloft         Current Medications (02/13/2017):  This is the current hospital active medication list Current Facility-Administered Medications  Medication Dose Route Frequency Provider Last Rate Last Dose  . 0.9 %  sodium chloride infusion   Intravenous Continuous Haydee Monica, MD 150 mL/hr at  02/13/17 0329    . aspirin EC tablet 81 mg  81 mg Oral Daily Tarry Kos A, MD   81 mg at 02/13/17 1020  . atorvastatin (LIPITOR) tablet 40 mg  40 mg Oral Daily Rolly Salter, MD   40 mg at 02/13/17 1020  . feeding supplement (ENSURE ENLIVE) (ENSURE ENLIVE) liquid 237 mL  237 mL Oral BID BM Rolly Salter, MD   237 mL at 02/13/17 1021  . fludrocortisone (FLORINEF) tablet 0.2 mg  0.2 mg Oral Daily Rolly Salter, MD      . fluticasone [facility]) 50 MCG/ACT nasal spray 1 spray  1 spray Each Nare Daily Rolly Salter, MD   1 spray at 02/12/17 2005  . methocarbamol (ROBAXIN) tablet 500 mg  500 mg Oral Q8H PRN Rolly Salter, MD      . OLANZapine [facility]) tablet 5 mg  5 mg Oral QPM Tarry Kos A, MD   5 mg at 02/12/17 1723  . sertraline (ZOLOFT) tablet 25 mg  25 mg Oral q morning - 10a Haydee Monica, MD   25 mg at 02/13/17 1020  . traMADol (ULTRAM) tablet 50 mg  50 mg Oral Q6H PRN Rolly Salter, MD   50 mg at 02/12/17 1850     Discharge Medications: Please see discharge summary for a list of discharge medications.  Relevant Imaging Results:  Relevant Lab Results:   Additional Information SS#: [id number].  Mearl Latin, LCSWA

## 2017-02-13 NOTE — Progress Notes (Signed)
[facility] Progress Note  Patient: Tonya Payne ZOX:[id number]   PCP: Rocky Morel, MD DOB: 1935-01-25   DOA: 02/11/2017   DOS: 02/13/2017   Date of Service: the patient was seen and examined on 02/13/2017  Subjective: Still remains dizzy on standing up. No chest pain or shortness of breath. Blood pressure drops significantly this morning.  Brief hospital course: Pt. with PMH of recurrent dizziness, AAA, CAD; admitted on 02/11/2017, presented with complaint of dizziness, was found to have orthostatic hypotension. Currently further plan is continue IV hydration. Add Florinef and monitor  Assessment and Plan: 1. Orthostatic hypotension. Recurrent dizziness. Patient has multiple frequent episodes with medical fall, pelvic fractures due to dizziness. He remains orthostatically positive. No other acute abnormality on telemetry. We will continue with aggressive IV hydration and add Florinef. recheck orthostatics. PTOT consulted. But likely will back to SNF.  2. Chronic diastolic CHF. Echocardiogram done September 2018 shows preserved EF with diastolic dysfunction. Do not suspect that the patient is volume overloaded. will be receiving actually IV fluids. Monitor.  3. Coronary disease S/P CABG. CABG done last year since then the frequency of patient's dizziness and fall has increased. Continue 81 mg aspirin Lopressor is currently on hold.  4. Mood disorder. Continuing Zoloft.  5.AAA. 5.6 cm in size. Has been irritated by vascular surgery as an outpatient as well as inpatient, currently no indication for surgical workup. Patient being evaluated for endovascular repair.  Diet: Cardiac diet DVT Prophylaxis: subcutaneous Heparin  Advance goals of care discussion: full code  Family Communication: no family was present at bedside, at the time of interview.  Disposition:  Discharge to SNF.  Consultants: none Procedures: none  Antibiotics: Anti-infectives    None       Objective: Physical Exam: Vitals:   02/12/17 2100 02/12/17 2241 02/13/17 0631 02/13/17 1404  BP: (!) 174/91 (!) 156/82 (!) 170/97 138/80  Pulse: 88  97 (!) 110  Resp: Temp: 98.3 F (36.8 C)  98.5 F (36.9 C) 98.7 F (37.1 C)  TempSrc: Oral  Oral Oral  SpO2: 97%   98%  Weight:      Height:        Intake/Output Summary (Last 24 hours) at 02/13/17 1815 Last data filed at 02/13/17 1300  Gross per 24 hour  Intake           1973.5 ml  Output             3300 ml  Net          -1326.5 ml   Filed Weights   02/11/17 1903  Weight: 53.5 kg (118 lb)   General: Alert, Awake and Oriented to Time, Place and Person. Appear in mild distress, affect appropriate Eyes: PERRL, Conjunctiva normal ENT: Oral Mucosa clear moist. Neck: no JVD, no Abnormal Mass Or lumps Cardiovascular: S1 and S2 Present, aortic systolic  Murmur, Peripheral Pulses Present Respiratory: normal respiratory effort, Bilateral Air entry equal and Decreased, no use of accessory muscle, Clear to Auscultation, no Crackles, no wheezes Abdomen: Bowel Sound present, Soft and no tenderness, no hernia Skin: no redness, no Rash, no induration Extremities: no Pedal edema, no calf tenderness Neurologic: Grossly no focal neuro deficit. Bilaterally Equal motor strength  Data Reviewed: CBC:  Recent Labs Lab 02/11/17 1910 02/12/17 0421 02/13/17 0727  WBC 9.5 9.5 7.6  NEUTROABS  --   --  5.3  HGB 11.9* 11.3* 11.4*  HCT 36.5 35.0* 35.8*  MCV 93.1 92.8 94.0  PLT 364 350 338   Basic Metabolic Panel:  Recent Labs Lab 02/11/17 1910 02/12/17 0421 02/13/17 0727  NA 134* 137 140  K 4.0 3.8 3.5  CL 100* 104 108  CO2 24 20* 22  GLUCOSE 127* 95 137*  BUN 14 12 5*  CREATININE 1.08* 0.90 1.05*  CALCIUM 9.5 9.1 8.7*  MG  --   --  1.4*    Liver Function Tests:  Recent Labs Lab 02/13/17 0727  AST 29  ALT 14  ALKPHOS 80  BILITOT 0.6  PROT 6.1*  ALBUMIN 3.1*   No results for input(s): LIPASE, AMYLASE in  the last 168 hours. No results for input(s): AMMONIA in the last 168 hours. Coagulation Profile: No results for input(s): INR, PROTIME in the last 168 hours. Cardiac Enzymes: No results for input(s): CKTOTAL, CKMB, CKMBINDEX, TROPONINI in the last 168 hours. BNP (last 3 results) No results for input(s): PROBNP in the last 8760 hours. CBG:  Recent Labs Lab 02/12/17 0047 02/12/17 2357 02/13/17 0753 02/13/17 1158 02/13/17 1708  GLUCAP 97 94 119* 184* 158*   Studies: No results found.  Scheduled Meds: . aspirin EC  81 mg Oral Daily  . atorvastatin  40 mg Oral Daily  . feeding supplement (ENSURE ENLIVE)  237 mL Oral BID BM  . fludrocortisone  0.2 mg Oral Daily  . fluticasone  1 spray Each Nare Daily  . OLANZapine  5 mg Oral QPM  . sertraline  25 mg Oral q morning - 10a   Continuous Infusions: . sodium chloride 150 mL/hr at 02/13/17 1747   PRN Meds:   Time spent: 35 minutes  Author: Lynden Oxford, MD [facility] Pager: [phone] 02/13/2017 6:15 PM  If 7PM-7AM, please contact night-coverage at [email or web address], password [facility]

## 2017-02-13 NOTE — Evaluation (Signed)
Physical Therapy Evaluation Patient Details Name: Tonya Payne MRN: [id number] DOB: [birth year] Today's Date: 02/13/2017   History of Present Illness  Pt is an 82 y/o female admitted from [facility] rehab secondary to dizziness. Of note, pt recently sustained a fall with spinal compression fxs. PMH including but not limited to a-fib, CAD s/p CABG, COPD and HLD.  Clinical Impression  Pt presented supine in bed with HOB elevated, awake and initially refusing all mobility. With max encouragement from therapist, pt only agreeable to rolling in bed secondary to dizziness when upright.Of note, pt with recent spinal compression fxs and is supposed to wear a TLSO but left it at the rehab facility. Pt would continue to benefit from skilled physical therapy services at this time while admitted and after d/c to address the below listed limitations in order to improve overall safety and independence with functional mobility.      Follow Up Recommendations SNF    Equipment Recommendations  None recommended by PT    Recommendations for Other Services       Precautions / Restrictions Precautions Precautions: Fall Precaution Comments: pt reported that she has a TLSO that she is suppose to wear but it is at her rehab facility Restrictions Weight Bearing Restrictions: No      Mobility  Bed Mobility Overal bed mobility: Needs Assistance Bed Mobility: Rolling Rolling: Min assist         General bed mobility comments: pt refusing all mobility at this time, only agreeable to rolling in bed to straighten out bed pads. Pt used bilateral UEs on bed rails to assist  Transfers                 General transfer comment: pt refusing at this time secondary to dizziness despite therapist's max encouragement  Ambulation/Gait                Stairs            Wheelchair Mobility    Modified Rankin (Stroke Patients Only)       Balance                                             Pertinent Vitals/Pain Pain Assessment: Faces Faces Pain Scale: Hurts a little bit Pain Location: back Pain Descriptors / Indicators: Sore Pain Intervention(s): Monitored during session;Repositioned    Home Living Family/patient expects to be discharged to:: Skilled nursing facility                 Additional Comments: [facility]    Prior Function Level of Independence: Needs assistance   Gait / Transfers Assistance Needed: ambulating with therapy with use of RW           Hand Dominance        Extremity/Trunk Assessment   Upper Extremity Assessment Upper Extremity Assessment: Defer to OT evaluation    Lower Extremity Assessment Lower Extremity Assessment: Generalized weakness    Cervical / Trunk Assessment Cervical / Trunk Assessment: Other exceptions Cervical / Trunk Exceptions: spinal compression fxs  Communication   Communication: No difficulties  Cognition Arousal/Alertness: Awake/alert Behavior During Therapy: Anxious Overall Cognitive Status: No family/caregiver present to determine baseline cognitive functioning  General Comments      Exercises     Assessment/Plan    PT Assessment Patient needs continued PT services  PT Problem List Decreased strength;Decreased activity tolerance;Decreased balance;Decreased mobility;Decreased coordination;Decreased cognition;Decreased knowledge of use of DME;Decreased safety awareness;Decreased knowledge of precautions       PT Treatment Interventions DME instruction;Gait training;Stair training;Functional mobility training;Therapeutic activities;Therapeutic exercise;Balance training;Neuromuscular re-education;Cognitive remediation;Patient/family education    PT Goals (Current goals can be found in the Care Plan section)  Acute Rehab PT Goals Patient Stated Goal: Go back to SNF PT Goal Formulation: With patient Time For Goal  Achievement: 02/27/17 Potential to Achieve Goals: Fair    Frequency Min 2X/week   Barriers to discharge        Co-evaluation               AM-PAC PT "6 Clicks" Daily Activity  Outcome Measure Difficulty turning over in bed (including adjusting bedclothes, sheets and blankets)?: Unable Difficulty moving from lying on back to sitting on the side of the bed? : Unable Difficulty sitting down on and standing up from a chair with arms (e.g., wheelchair, bedside commode, etc,.)?: Unable Help needed moving to and from a bed to chair (including a wheelchair)?: Total Help needed walking in hospital room?: Total Help needed climbing 3-5 steps with a railing? : Total 6 Click Score: 6    End of Session   Activity Tolerance: Other (comment) (pt refusing all mobility except rolling due to dizziness) Patient left: in bed;with call bell/phone within reach;with bed alarm set Nurse Communication: Mobility status PT Visit Diagnosis: Other abnormalities of gait and mobility (R26.89);Dizziness and giddiness (R42)    Time: 1610-9604 PT Time Calculation (min) (ACUTE ONLY): 15 min   Charges:   PT Evaluation $PT Eval Moderate Complexity: 1 Mod     PT G Codes:        Linton Hall, PT, DPT [phone]   Alessandra Bevels Zamariyah Furukawa 02/13/2017, 3:10 PM

## 2017-02-13 NOTE — Progress Notes (Signed)
Notified Dr. Allena Katz of pt's orthostatic VS: laying BP 170/97 HR 106, sitting BP 123/92 HR 116, Standing BP 78/69 HR 140. Told this information to dayshift RN Ireti and told her that Dr. Allena Katz had been notified but no new orders yet. Will continue to monitor pt. Nelda Marseille, RN

## 2017-02-13 NOTE — Progress Notes (Signed)
Notified Schorr, NP of pt's orthostatics this morning: laying BP 170/97 HR 106, sitting: BP 123/92 HR 116, Standing: BP 78/69 HR 140. Will continue to monitor pt. Nelda Marseille, RN

## 2017-02-14 LAB — BASIC METABOLIC PANEL
Anion gap: 8 (ref 5–15)
BUN: 8 mg/dL (ref 6–20)
CO2: 22 mmol/L (ref 22–32)
CREATININE: 0.78 mg/dL (ref 0.44–1.00)
Calcium: 8.5 mg/dL — ABNORMAL LOW (ref 8.9–10.3)
Chloride: 110 mmol/L (ref 101–111)
GFR calc Af Amer: >60 mL/min (ref >60)
GFR calc non Af Amer: >60 mL/min (ref >60)
GLUCOSE: 101 mg/dL — AB (ref 65–99)
POTASSIUM: 3.3 mmol/L — AB (ref 3.5–5.1)
Sodium: 140 mmol/L (ref 135–145)

## 2017-02-14 LAB — GLUCOSE, CAPILLARY
GLUCOSE-CAPILLARY: 98 mg/dL (ref 65–99)
GLUCOSE-CAPILLARY: 145 mg/dL — AB (ref 65–99)

## 2017-02-14 LAB — MAGNESIUM: Magnesium: 1.4 mg/dL — ABNORMAL LOW (ref 1.7–2.4)

## 2017-02-14 MED ORDER — POTASSIUM CHLORIDE CRYS ER 20 MEQ PO TBCR
40.0000 meq | EXTENDED_RELEASE_TABLET | Freq: Once | ORAL | Status: AC
  Administered 2017-02-14: 40 meq via ORAL
  Filled 2017-02-14: qty 2

## 2017-02-14 MED ORDER — MAGNESIUM SULFATE 2 GM/50ML IV SOLN
2.0000 g | Freq: Once | INTRAVENOUS | Status: AC
  Administered 2017-02-14: 2 g via INTRAVENOUS
  Filled 2017-02-14: qty 50

## 2017-02-14 MED ORDER — FLUDROCORTISONE ACETATE 0.1 MG PO TABS: 0.2000 mg | ORAL_TABLET | Freq: Every day | ORAL | 0 refills | Status: DC

## 2017-02-14 MED ORDER — TRAMADOL HCL 50 MG PO TABS: 50.0000 mg | ORAL_TABLET | Freq: Four times a day (QID) | ORAL | 0 refills | Status: DC | PRN

## 2017-02-14 MED ORDER — OLANZAPINE 2.5 MG PO TABS: 2.5000 mg | ORAL_TABLET | Freq: Every evening | ORAL | 0 refills | Status: AC

## 2017-02-14 MED ORDER — OLANZAPINE 2.5 MG PO TABS
2.5000 mg | ORAL_TABLET | Freq: Every evening | ORAL | Status: DC
  Filled 2017-02-14: qty 1

## 2017-02-14 NOTE — Progress Notes (Signed)
Patient will DC to: [facility] Anticipated DC date: 02/14/17 Family notified: Pt notifying son Transport by: Sherlyn Hay   Per MD patient ready for DC to [facility]. RN, patient, patient's family, and facility notified of DC. Discharge Summary sent to facility. RN given number for report [phone]). DC packet on chart. Ambulance transport requested for patient.   CSW signing off.  Cristobal Goldmann, [place] Clinical Social Worker [phone]

## 2017-02-14 NOTE — Progress Notes (Signed)
Pt resting in bed quietly. Easily aroused. C/o HA and pain management was administered per order and effective. neurochecks continues q 4 hours and remain unchanged pupli size is 3 mm. Hand grasps are moderate and feet are moderate as well able to follow simple comands. Is noted to be very orthostatic when standing and c/o becoming dizzy. Pt on low bed and safety is maintained. Large BM this shift. Continent and uses purwick cath with yellow urine that appears clear. IVF infusing with patent cath. Will continue to monitor.

## 2017-02-14 NOTE — Discharge Summary (Signed)
[facility] Discharge Summary   Patient: Tonya Payne AVW:[id number]   PCP: Rocky Morel, MD DOB: 09-13-1934   Date of admission: 02/11/2017   Date of discharge:  02/14/2017    Discharge Diagnoses:  Principal Problem:   Orthostatic hypotension Active Problems:   Congestive dilated cardiomyopathy (HCC)   S/P CABG x 3   AAA (abdominal aortic aneurysm) (HCC)   Fall   Orthostatic dizziness   CKD (chronic kidney disease), stage III (HCC)   COPD (chronic obstructive pulmonary disease) (HCC)   Admitted From: SNF Disposition:  SNF  Recommendations for Outpatient Follow-up:  1. Please follow-up with PCP in one week with a repeat BMP  2. may need gradual down titration of Zyprexa with eventual replacement with some other medication and gradual up titration of Florinef.  Follow-up Information    Rocky Morel, MD. Schedule an appointment as soon as possible for a visit in 1 week(s).   Specialty:  Internal Medicine Why:  repeat BMP.  Contact information: [address] [place] [address] [phone]          Diet recommendation: Regular diet  Activity: The patient is advised to gradually reintroduce usual activities.  Discharge Condition: good  Code Status: Full code  History of present illness: As per the H and P dictated on admission, " Tonya Payne is a 82 y.o. female with medical history significant of longstanding orthostatic dizziness/hypotension, AAA, freq falls, afib, CAD comes in with dizzy spell and falling.  Pt recently placed in rehab center for fall and compression fx of her spine.  She has been doing well in rehab.  Walking with a walker.  She still gets dizzy when she gets up to move around.  She does not like the food at the rehab and does not drink or eat well chronically.  Today she got dizzy while up and moving, sat down and got better.  No weakness, numbness, tingling anywhere.  No fevers.  No n/v/d.  No pain anywhere.  Her back pain  has been well managed at the rehab.  Pt had orthostatics in the ED done which she dropped significantly and she became very symptomatic with dizziness.  This is all better with her lying back down.  Pt referred for admission for her symptoms which persist after 1 liter of ivf given."  Hospital Course:  Summary of her active problems in the hospital is as following. 1. Orthostatic hypotension. Recurrent dizziness. Patient has multiple frequent episodes with medical fall, pelvic fractures due to dizziness. He remains orthostatically positive. No other acute abnormality on telemetry. Patient was given aggressive IV hydration. Blood pressure on the time of the discharge lying 142/75, standing at 3 minutes 107/70. They show significant improvement in her volume status. Currently plan to avoid orthostatic hypotension in future would be to continue further up titration of Florinef and consider  further down titration or replacing Zyprexa.  2. Chronic diastolic CHF. Echocardiogram done September 2018 shows preserved EF with diastolic dysfunction. Patient was on Lopressor for essential hypertension, would probably avoid any antihypertensive medication and his blood pressure is persistently alarmingly elevated.  3. Coronary disease S/P CABG. CABG done last year since then the frequency of patient's dizziness and fall has increased. Continue 81 mg aspirin Lopressor is currently on hold.  4. Mood disorder. Continuing Zoloft.  5.AAA. 5.6 cm in size. Has been irritated by vascular surgery as an outpatient as well as inpatient, currently no indication for surgical workup. Patient being evaluated for  endovascular repair. Difficult situation since allowing high blood pressure in supine position can cause more medications with AAA but patient had multiple admissions due to dizziness since last one year. Currently blood pressure is stable without any medication would recommend avoid adding any medication  for now.  All other chronic medical condition were stable during the hospitalization.  Patient was seen by physical therapy, who recommended SNF, which was arranged by Child psychotherapist and case Production designer, theatre/television/film. On the day of the discharge the patient's vitals were stable, and no other acute medical condition were reported by patient. the patient was felt safe to be discharge at SNF with thrapt.  Procedures and Results:  none   Consultations:  none  DISCHARGE MEDICATION: Current Discharge Medication List    START taking these medications   Details  fludrocortisone (FLORINEF) 0.1 MG tablet Take 2 tablets (0.2 mg total) by mouth daily. Qty: 30 tablet, Refills: 0      CONTINUE these medications which have CHANGED   Details  OLANZapine (ZYPREXA) 2.5 MG tablet Take 1 tablet (2.5 mg total) by mouth every evening. Qty: 30 tablet, Refills: 0    traMADol (ULTRAM) 50 MG tablet Take 1 tablet (50 mg total) by mouth every 6 (six) hours as needed for moderate pain. Qty: 6 tablet, Refills: 0      CONTINUE these medications which have NOT CHANGED   Details  aspirin EC 81 MG tablet Take 81 mg by mouth daily.    atorvastatin (LIPITOR) 40 MG tablet Take 1 tablet (40 mg total) by mouth daily at 6 PM. Qty: 30 tablet, Refills: 3    docusate sodium (COLACE) 100 MG capsule Take 1 capsule (100 mg total) by mouth 2 (two) times daily. Qty: 10 capsule, Refills: 0    fexofenadine (ALLEGRA) 180 MG tablet Take 180 mg by mouth every evening.     fluticasone (FLONASE) 50 MCG/ACT nasal spray Place 1 spray into both nostrils daily. Qty: 16 g, Refills: 2    megestrol (MEGACE) 40 MG/ML suspension Take 800 mg by mouth daily as needed for other. appetite Refills: 3    methocarbamol (ROBAXIN) 500 MG tablet Take 1 tablet (500 mg total) by mouth every 8 (eight) hours as needed for muscle spasms. Qty: 30 tablet, Refills: 0    Multiple Vitamins-Minerals (MULTIVITAMIN ADULTS PO) Take 1 tablet by mouth daily.       polyethylene glycol (MIRALAX / GLYCOLAX) packet Take 17 g by mouth daily as needed. Qty: 14 each, Refills: 0    sertraline (ZOLOFT) 50 MG tablet Take 0.5 tablets (25 mg total) by mouth every morning.    feeding supplement, ENSURE ENLIVE, (ENSURE ENLIVE) LIQD Take 237 mLs by mouth 2 (two) times daily between meals. Qty: 237 mL, Refills: 12      STOP taking these medications     amoxicillin (AMOXIL) 500 MG capsule      metoprolol tartrate (LOPRESSOR) 25 MG tablet        No Known Allergies Discharge Instructions    Diet - low sodium heart healthy    Complete by:  As directed    Discharge instructions    Complete by:  As directed    It is important that you read following instructions as well as go over your medication list with RN to help you understand your care after this hospitalization.  Discharge Instructions: Please follow-up with PCP in one week  Please request your primary care physician to go over all Hospital Tests and Procedure/Radiological results at  the follow up,  Please get all Hospital records sent to your PCP by signing hospital release before you go home.   Do not drive, operating heavy machinery, perform activities at heights, swimming or participation in water activities or provide baby sitting services; until you have been seen by Primary Care Physician or a Neurologist and advised to do so again. Do not take more than prescribed Pain, Sleep and Anxiety Medications. You were cared for by a hospitalist during your hospital stay. If you have any questions about your discharge medications or the care you received while you were in the hospital after you are discharged, you can call the unit and ask to speak with the hospitalist on call if the hospitalist that took care of you is not available.  Once you are discharged, your primary care physician will handle any further medical issues. Please note that NO REFILLS for any discharge medications will be authorized  once you are discharged, as it is imperative that you return to your primary care physician (or establish a relationship with a primary care physician if you do not have one) for your aftercare needs so that they can reassess your need for medications and monitor your lab values. You Must read complete instructions/literature along with all the possible adverse reactions/side effects for all the Medicines you take and that have been prescribed to you. Take any new Medicines after you have completely understood and accept all the possible adverse reactions/side effects. Wear Seat belts while driving.   Increase activity slowly    Complete by:  As directed      Discharge Exam: Filed Weights   02/11/17 1903  Weight: 53.5 kg (118 lb)   Vitals:   02/14/17 0600 02/14/17 1311  BP: (!) 152/92 (!) 151/77  Pulse: 99 99  Resp:    Temp: 97.9 F (36.6 C) 98.9 F (37.2 C)  SpO2: 97% 100%   General: Appear in no distress, no Rash; Oral Mucosa moist. Cardiovascular: S1 and S2 Present, no Murmur, no JVD Respiratory: Bilateral Air entry present and Clear to Auscultation, no Crackles, no wheezes Abdomen: Bowel Sound present, Soft and no tenderness Extremities: no Pedal edema, no calf tenderness Neurology: Grossly no focal neuro deficit.  The results of significant diagnostics from this hospitalization (including imaging, microbiology, ancillary and laboratory) are listed below for reference.    Significant Diagnostic Studies: Ct Head Wo Contrast  Result Date: 02/11/2017 CLINICAL DATA:  Acute onset of dizziness, with fall to the right side. Right-sided head and neck pain. Initial encounter. EXAM: CT HEAD WITHOUT CONTRAST CT CERVICAL SPINE WITHOUT CONTRAST TECHNIQUE: Multidetector CT imaging of the head and cervical spine was performed following the standard protocol without intravenous contrast. Multiplanar CT image reconstructions of the cervical spine were also generated. COMPARISON:  CT of the head  and cervical spine performed 12/29/2016 FINDINGS: CT HEAD FINDINGS Brain: No evidence of acute infarction, hemorrhage, hydrocephalus, extra-axial collection or mass lesion/mass effect. Prominence of the ventricles and sulci reflects moderate cortical volume loss. Mild cerebellar atrophy is noted. Scattered periventricular and subcortical white matter change likely reflects small vessel ischemic microangiopathy. Chronic lacunar infarcts are noted at the basal ganglia bilaterally. A chronic infarct is noted at the right occipital lobe, with associated encephalomalacia. The brainstem and fourth ventricle are within normal limits. No mass effect or midline shift is seen. Vascular: No hyperdense vessel or unexpected calcification. Skull: There is no evidence of fracture; visualized osseous structures are unremarkable in appearance. Sinuses/Orbits: The visualized portions  of the orbits are within normal limits. The paranasal sinuses and mastoid air cells are well-aerated. Other: No significant soft tissue abnormalities are seen. CT CERVICAL SPINE FINDINGS Alignment: Normal. Skull base and vertebrae: No acute fracture. No primary bone lesion or focal pathologic process. Soft tissues and spinal canal: No prevertebral fluid or swelling. No visible canal hematoma. Disc levels: The patient is status post anterior cervical spinal fusion at C5-C7, with mild underlying facet disease. Mild degenerative change is noted about the dens. Upper chest: Scattered calcification is noted along the carotid bifurcations bilaterally, more prominent on the left. The visualized portions of the thyroid gland are unremarkable. Mild scarring is noted at the lung apices. Other: No additional soft tissue abnormalities are seen. IMPRESSION: 1. No evidence of traumatic intracranial injury or fracture. 2. No evidence of fracture or subluxation along the cervical spine. 3. Moderate cortical volume loss and scattered small vessel ischemic  microangiopathy. 4. Chronic lacunar infarcts at the basal ganglia bilaterally. Chronic infarct at the right occipital lobe, with associated encephalomalacia. 5. Status post anterior cervical spinal fusion at C5-C7, with minimal underlying degenerative change. 6. Scattered calcification along the carotid bifurcations, more prominent on the left. Carotid ultrasound would be helpful for further evaluation, when and as deemed clinically appropriate. Electronically Signed   By: Roanna Raider M.D.   On: 02/11/2017 22:35   Ct Cervical Spine Wo Contrast  Result Date: 02/11/2017 CLINICAL DATA:  Acute onset of dizziness, with fall to the right side. Right-sided head and neck pain. Initial encounter. EXAM: CT HEAD WITHOUT CONTRAST CT CERVICAL SPINE WITHOUT CONTRAST TECHNIQUE: Multidetector CT imaging of the head and cervical spine was performed following the standard protocol without intravenous contrast. Multiplanar CT image reconstructions of the cervical spine were also generated. COMPARISON:  CT of the head and cervical spine performed 12/29/2016 FINDINGS: CT HEAD FINDINGS Brain: No evidence of acute infarction, hemorrhage, hydrocephalus, extra-axial collection or mass lesion/mass effect. Prominence of the ventricles and sulci reflects moderate cortical volume loss. Mild cerebellar atrophy is noted. Scattered periventricular and subcortical white matter change likely reflects small vessel ischemic microangiopathy. Chronic lacunar infarcts are noted at the basal ganglia bilaterally. A chronic infarct is noted at the right occipital lobe, with associated encephalomalacia. The brainstem and fourth ventricle are within normal limits. No mass effect or midline shift is seen. Vascular: No hyperdense vessel or unexpected calcification. Skull: There is no evidence of fracture; visualized osseous structures are unremarkable in appearance. Sinuses/Orbits: The visualized portions of the orbits are within normal limits. The  paranasal sinuses and mastoid air cells are well-aerated. Other: No significant soft tissue abnormalities are seen. CT CERVICAL SPINE FINDINGS Alignment: Normal. Skull base and vertebrae: No acute fracture. No primary bone lesion or focal pathologic process. Soft tissues and spinal canal: No prevertebral fluid or swelling. No visible canal hematoma. Disc levels: The patient is status post anterior cervical spinal fusion at C5-C7, with mild underlying facet disease. Mild degenerative change is noted about the dens. Upper chest: Scattered calcification is noted along the carotid bifurcations bilaterally, more prominent on the left. The visualized portions of the thyroid gland are unremarkable. Mild scarring is noted at the lung apices. Other: No additional soft tissue abnormalities are seen. IMPRESSION: 1. No evidence of traumatic intracranial injury or fracture. 2. No evidence of fracture or subluxation along the cervical spine. 3. Moderate cortical volume loss and scattered small vessel ischemic microangiopathy. 4. Chronic lacunar infarcts at the basal ganglia bilaterally. Chronic infarct at the right occipital  lobe, with associated encephalomalacia. 5. Status post anterior cervical spinal fusion at C5-C7, with minimal underlying degenerative change. 6. Scattered calcification along the carotid bifurcations, more prominent on the left. Carotid ultrasound would be helpful for further evaluation, when and as deemed clinically appropriate. Electronically Signed   By: Roanna Raider M.D.   On: 02/11/2017 22:35   US Renal  Result Date: 01/31/2017 CLINICAL DATA:  82 year old female with bilateral flank pain for several months. EXAM: RENAL / URINARY TRACT ULTRASOUND COMPLETE COMPARISON:  01/29/2017 CT FINDINGS: Right Kidney: Length: 9.8 cm. Upper limits of normal renal echogenicity with mild cortical thinning. No evidence of hydronephrosis or solid mass. Left Kidney: Length: 8.6 cm. Upper limits of normal renal  echogenicity with moderate cortical thinning. No evidence of hydronephrosis or solid mass. Bladder: Foley catheter within the bladder noted.  No definite abnormalities. IMPRESSION: 1. No evidence of acute abnormality.  No evidence of hydronephrosis. 2. Upper limits of normal renal echogenicity with renal cortical thinning, mild on the right and moderate on the left. Electronically Signed   By: Harmon Pier M.D.   On: 01/31/2017 08:13   Ct L-spine No Charge  Result Date: 01/29/2017 CLINICAL DATA:  82 year old female with back pain and syncope. Known abdominal aortic aneurysm. T9 compression fracture in August after a fall. EXAM: CT LUMBAR SPINE WITHOUT CONTRAST TECHNIQUE: Multidetector CT imaging of the lumbar spine was performed without intravenous contrast administration. Multiplanar CT image reconstructions were also generated. COMPARISON:  CTA Abdomen and Pelvis today reported separately. FINDINGS: Segmentation: Normal. The same numbering system was used on 12/29/2016 CT chest abdomen and pelvis. Alignment: Stable and largely normal lumbar vertebral height and alignment. Trace anterolisthesis of L5 on S1 is stable. Vertebrae: Osteopenia. No acute osseous abnormality identified. Visible sacrum and SI joints appear intact. Paraspinal and other soft tissues: Partially visible large 6 cm diameter infrarenal abdominal aortic aneurysm. See CT Abdomen and Pelvis today reported separately. No acute findings in the visualized posterior paraspinal soft tissues. Disc levels: T12-L1:  Negative. L1-L2:  Negative. L2-L3: Mild disc bulging with bilateral broad-based foraminal component. Mild epidural lipomatosis and facet hypertrophy. Mild if any spinal stenosis. No convincing foraminal stenosis. L3-L4: Mild circumferential disc bulge. Mild facet hypertrophy. No stenosis. L4-L5: Circumferential disc bulge eccentric to the right with broad-based posterior component. Prior laminectomy. Mild to moderate residual facet  hypertrophy. Mild to moderate left and mild right L4 neural foraminal stenosis. L5-S1: Trace anterolisthesis. Circumferential disc bulge with broad-based posterior component. Prior laminectomy with severe residual facet hypertrophy. Right side vacuum facet. No spinal stenosis. Borderline to mild bilateral lateral recess and foraminal stenosis. IMPRESSION: 1. Partially visible large 6 cm infrarenal abdominal aortic aneurysm with prominent growth over the past 12 months suggesting an increased risk of aneurysm rupture. Recommend Vascular Surgery referral/consultation if not already obtained. 2. See also CTA Abdomen and Pelvis today reported separately. 3.  No acute osseous abnormality in the lumbar spine. 4. Mild if any degenerative lumbar spinal stenosis. Prior laminectomies at L4-L5 and L5-S1 with moderate to severe residual facet hypertrophy resulting in primarily mild bilateral foraminal stenosis. Electronically Signed   By: Odessa Fleming M.D.   On: 01/29/2017 16:59   Dg Chest Port 1 View  Result Date: 01/29/2017 CLINICAL DATA:  Chest pain and acute onset shortness of breath for 1 week. EXAM: PORTABLE CHEST 1 VIEW COMPARISON:  CT chest December 29, 2016 and chest radiograph November 12, 2016 FINDINGS: Cardiac silhouette is mildly enlarged and unchanged. Status post median sternotomy for  CABG. Calcified aortic knob. Mild chronic interstitial changes increased lung volumes without pleural effusion. Bibasilar strandy densities. No pleural effusion or focal consolidation. Biapical pleural thickening. No pneumothorax. Soft tissue planes and included osseous structures are nonacute. Osteopenia. IMPRESSION: Stable cardiomegaly.  COPD and bibasilar fibrosis. Electronically Signed   By: Awilda Metro M.D.   On: 01/29/2017 21:47   Ct Angio Chest/abd/pel For Dissection W And/or Wo Contrast  Result Date: 02/11/2017 CLINICAL DATA:  Acute onset of dizziness. Status post fall. Follow-up known thoracoabdominal aortic aneurysm.  Initial encounter. EXAM: CT ANGIOGRAPHY CHEST, ABDOMEN AND PELVIS TECHNIQUE: Multidetector CT imaging through the chest, abdomen and pelvis was performed using the standard protocol during bolus administration of intravenous contrast. Multiplanar reconstructed images and MIPs were obtained and reviewed to evaluate the vascular anatomy. CONTRAST:  90 mL of Isovue 370 IV contrast COMPARISON:  CT of the chest performed 12/29/2016, and CTA of the abdomen and pelvis performed 01/29/2017 FINDINGS: CTA CHEST FINDINGS Cardiovascular: There is no evidence of thoracic aortic aneurysm. There is no evidence of aortic dissection. Scattered calcification is noted along the aortic arch and proximal great vessels, and along the descending thoracic aorta, with minimal associated mural thrombus but no significant luminal narrowing. The heart remains normal in size. The patient is status post median sternotomy, with evidence of prior CABG. Mediastinum/Nodes: No mediastinal lymphadenopathy is seen. No pericardial effusion is identified. The visualized portions of the thyroid gland are unremarkable. No axillary lymphadenopathy is appreciated. Lungs/Pleura: Scarring is noted at the lung apices. Bilateral emphysema is noted, with underlying scarring and peripheral nodularity. No pleural effusion or pneumothorax is seen. No dominant mass is identified. Musculoskeletal: No acute osseous abnormalities are identified. A compression fracture of vertebral body T9 appears slightly worsened from the prior study. The visualized musculature is unremarkable in appearance. Review of the MIP images confirms the above findings. CTA ABDOMEN AND PELVIS FINDINGS VASCULAR Aorta: There is aneurysmal dilatation of the infrarenal abdominal aorta to 5.6 cm in AP dimension and 5.8 cm in transverse dimension, relatively stable from the recent prior CTA. Underlying thrombosis of the aneurysm sac is relatively stable in appearance. The aneurysm resolves just  proximal to the aortic bifurcation. No soft tissue inflammation is seen. Scattered calcification is noted along the abdominal aorta. Celiac: The celiac trunk appears grossly intact. SMA: Mild calcification is noted along the superior mesenteric artery, though it remains grossly intact. Renals: There is calcification along the proximal renal arteries bilaterally, with luminal narrowing along the proximal right renal artery. Would correlate for evidence of refractory hypertension. IMA: The inferior mesenteric artery is not visualized. Inflow: Scattered calcification is noted along the common, internal and external iliac arteries bilaterally. Scattered calcification is seen at the common femoral arteries bilaterally. Veins: Visualized venous structures are grossly unremarkable in appearance. Review of the MIP images confirms the above findings. NON-VASCULAR Hepatobiliary: The liver is unremarkable in appearance. The gallbladder is unremarkable in appearance. The common bile duct remains normal in caliber. Pancreas: The pancreas is within normal limits. Spleen: The spleen is unremarkable in appearance. Adrenals/Urinary Tract: The adrenal glands are unremarkable in appearance. There is moderate left renal atrophy, and mild bilateral renal scarring. There is chronic right-sided renal pelvicaliectasis, without significant hydronephrosis. No distal obstructing stone is identified. No significant perinephric stranding is appreciated. Stomach/Bowel: No significant small bowel abnormalities are seen. The stomach is grossly unremarkable in appearance. The appendix is not definitely seen; there is no evidence for appendicitis. Scattered diverticulosis is noted along the sigmoid colon.  Postoperative change is noted at the distal sigmoid colon. There is no evidence of diverticulitis at this time. Lymphatic: No retroperitoneal or pelvic sidewall lymphadenopathy is seen. Reproductive: The bladder is mildly distended and grossly  unremarkable in appearance. The patient is status post hysterectomy. No suspicious adnexal masses are seen. Other: No additional soft tissue abnormalities are seen. Musculoskeletal: No acute osseous abnormalities are identified. Pins are noted at the left femoral neck. The visualized musculature is unremarkable in appearance. Review of the MIP images confirms the above findings. IMPRESSION: 1. Infrarenal abdominal aortic aneurysm measures approximately 5.6 cm in AP dimension and 5.8 cm in transverse dimension, relatively stable from the recent prior CTA. Underlying thrombosis of the aneurysm sac is also stable in appearance. The aneurysm resolves just proximal to the aortic bifurcation. 2. No evidence of thoracic aortic aneurysm. No evidence of aortic dissection. 3. Scattered aortic atherosclerosis. 4. Bilateral emphysema, with underlying scarring and peripheral nodularity. No dominant mass seen. Scarring at the lung apices. 5. Slightly worsened compression fracture vertebral body T9, in comparison to the prior CT of the chest. 6. Moderate left renal atrophy, and mild bilateral renal scarring. 7. Scattered diverticulosis along the sigmoid colon, without evidence of diverticulitis. Electronically Signed   By: Roanna Raider M.D.   On: 02/11/2017 22:51   Ct Angio Abd/pel W And/or Wo Contrast  Result Date: 01/29/2017 CLINICAL DATA:  Abdominal aortic aneurysm fall.  Syncopal episodes. EXAM: CTA ABDOMEN AND PELVIS WITHOUT AND WITH CONTRAST TECHNIQUE: Multidetector CT imaging of the abdomen and pelvis was performed using the standard protocol during bolus administration of intravenous contrast. Multiplanar reconstructed images and MIPs were obtained and reviewed to evaluate the vascular anatomy. CONTRAST:  80 cc Isovue 370 IV COMPARISON:  12/29/2016 CT FINDINGS: VASCULAR Aorta: Infrarenal abdominal aortic aneurysm is again noted, stable in appearance, estimated at 5.9 x 5.4 cm without periaortic stranding to suggest  rupture. Measurements are similar to most recent prior exam though reported as being 6 x 5.9 cm but should have been 6 x 5.49 cm. Extensive atherosclerotic atherosclerosis with mural thrombus is again seen. No acute vascular injury, retroperitoneal fluid or hemorrhage. Celiac: Patent with moderate calcific atherosclerosis at its origin with a 50% stenosis suggested. No evidence of dissection, vasculitis or significant stenosis. SMA: Patent with mild-to-moderate atherosclerosis at the origin with less than 50% stenosis suggested. No evidence of aneurysm, dissection, vasculitis or significant stenosis. Renals: Single renal arteries to both kidneys with atherosclerosis along the proximal right renal artery and origin of the left with at least 50% stenosis suggested on the right and also likely in the left. No dissection, or evidence of fibromuscular dysplasia. No aneurysm. Perfusion is symmetric both kidneys however. IMA: Not definitively identified and is likely occluded. Inflow: Moderate atherosclerosis of the common iliac arteries without aneurysm, dissection or significant stenosis. Thin attenuated but patent internal iliac arteries with small external iliac arteries. Proximal Outflow: Patent without evidence of aneurysm, dissection, vasculitis or significant stenosis. Veins: No obvious venous abnormality within the limitations of this arterial phase study. Review of the MIP images confirms the above findings. NON-VASCULAR Lower chest: Extensive bilateral centrilobular and paraseptal emphysema with subpleural areas of fibrosis. Stable right lower lobe 8 mm opacity may reflect an area of chronic scarring, unchanged since recent comparison from 12/29/2016 although new since 10/23/2015. Hepatobiliary: No focal liver abnormality is seen. No gallstones, gallbladder wall thickening, or biliary dilatation. Pancreas: Unremarkable. No pancreatic ductal dilatation or surrounding inflammatory changes. Spleen: Normal in size  without focal  abnormality. Adrenals/Urinary Tract: Bilateral renal cortical thinning more pronounced on the left. No obstructive uropathy or nephrolithiasis. Tiny exophytic hyperdense lesion in the upper pole left kidney may represent a hemorrhagic or proteinaceous cyst. This measures 4 mm and is too small to further characterize. Stomach/Bowel: Contracted stomach with normal small bowel rotation. No acute bowel inflammation or obstruction. Evidence of prior partial colectomy with anastomotic sutures along the expected location of the rectosigmoid. Small bowel anastomotic sutures are also seen in the mid pelvis. Appendix is not definitively identified but there is no evidence of right lower quadrant or pericecal inflammation. Lymphatic: No lymphadenopathy. Reproductive: Status post hysterectomy. No adnexal masses. Other: No abdominal wall hernia or abnormality. No abdominopelvic ascites. Musculoskeletal: No acute or significant osseous findings. IMPRESSION: VASCULAR 1. Re- demonstration of a stable infrarenal fusiform abdominal aortic aneurysm unchanged since CT from 12/29/2016. It is currently estimated at 5.9 x 5.4 cm. No evidence rupture or leak. 2. 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 NON-VASCULAR Centrilobular and paraseptal emphysema of the lung bases with interstitial fibrosis. Probable scarring at the right lung base accounting for an 8 mm opacity seen. Electronically Signed   By: Tollie Eth M.D.   On: 01/29/2017 19:02    Microbiology: No results found for this or any previous visit (from the past 240 hour(s)).   Labs: CBC:  Recent Labs Lab 02/11/17 1910 02/12/17 0421 02/13/17 0727  WBC 9.5 9.5 7.6  NEUTROABS  --   --  5.3  HGB 11.9* 11.3* 11.4*  HCT 36.5 35.0* 35.8*  MCV 93.1 92.8 94.0  PLT 364 350 338   Basic Metabolic Panel:  Recent  Labs Lab 02/11/17 1910 02/12/17 0421 02/13/17 0727 02/14/17 0450  NA 134* 137 140 140  K 4.0 3.8 3.5 3.3*  CL 100* 104 108 110  CO2 24 20* 22 22  GLUCOSE 127* 95 137* 101*  BUN 14 12 5* 8  CREATININE 1.08* 0.90 1.05* 0.78  CALCIUM 9.5 9.1 8.7* 8.5*  MG  --   --  1.4* 1.4*   Liver Function Tests:  Recent Labs Lab 02/13/17 0727  AST 29  ALT 14  ALKPHOS 80  BILITOT 0.6  PROT 6.1*  ALBUMIN 3.1*   No results for input(s): LIPASE, AMYLASE in the last 168 hours. No results for input(s): AMMONIA in the last 168 hours. Cardiac Enzymes: No results for input(s): CKTOTAL, CKMB, CKMBINDEX, TROPONINI in the last 168 hours. BNP (last 3 results) No results for input(s): BNP in the last 8760 hours. CBG:  Recent Labs Lab 02/13/17 1158 02/13/17 1708 02/13/17 2034 02/14/17 0744 02/14/17 1154  GLUCAP 184* 158* 109* 98 145*   Time spent: 35 minutes  Signed:  Oline Belk  [facility]  02/14/2017  , 1:14 PM

## 2017-02-14 NOTE — Discharge Summary (Signed)
Pt is alert and oriented x3 upon discharge. Skin intact besides some redness on bottom but blanchable. Report called to [facility], RN. Pt discharge via ambulance to [facility]

## 2017-02-24 ENCOUNTER — Observation Stay (HOSPITAL_COMMUNITY): Payer: Medicare Other

## 2017-02-24 ENCOUNTER — Inpatient Hospital Stay (HOSPITAL_COMMUNITY)
Admission: EM | Admit: 2017-02-24 | Discharge: 2017-02-26 | DRG: 312 | Disposition: A | Discharge status: Skilled Nursing Facility | Payer: Medicare Other | Attending: Internal Medicine | Admitting: Internal Medicine

## 2017-02-24 ENCOUNTER — Encounter (HOSPITAL_COMMUNITY): Payer: Self-pay

## 2017-02-24 DIAGNOSIS — M4856XD Collapsed vertebra, not elsewhere classified, lumbar region, subsequent encounter for fracture with routine healing: Secondary | ICD-10-CM | POA: Diagnosis present

## 2017-02-24 DIAGNOSIS — E440 Moderate protein-calorie malnutrition: Secondary | ICD-10-CM | POA: Diagnosis present

## 2017-02-24 DIAGNOSIS — N183 Chronic kidney disease, stage 3 unspecified: Secondary | ICD-10-CM | POA: Diagnosis present

## 2017-02-24 DIAGNOSIS — R55 Syncope and collapse: Secondary | ICD-10-CM | POA: Diagnosis present

## 2017-02-24 DIAGNOSIS — I951 Orthostatic hypotension: Principal | ICD-10-CM | POA: Diagnosis present

## 2017-02-24 DIAGNOSIS — I130 Hypertensive heart and chronic kidney disease with heart failure and stage 1 through stage 4 chronic kidney disease, or unspecified chronic kidney disease: Secondary | ICD-10-CM | POA: Diagnosis present

## 2017-02-24 DIAGNOSIS — N390 Urinary tract infection, site not specified: Secondary | ICD-10-CM | POA: Diagnosis present

## 2017-02-24 DIAGNOSIS — I420 Dilated cardiomyopathy: Secondary | ICD-10-CM | POA: Diagnosis present

## 2017-02-24 DIAGNOSIS — N179 Acute kidney failure, unspecified: Secondary | ICD-10-CM | POA: Diagnosis present

## 2017-02-24 DIAGNOSIS — I2510 Atherosclerotic heart disease of native coronary artery without angina pectoris: Secondary | ICD-10-CM | POA: Diagnosis present

## 2017-02-24 DIAGNOSIS — T380X5A Adverse effect of glucocorticoids and synthetic analogues, initial encounter: Secondary | ICD-10-CM | POA: Diagnosis present

## 2017-02-24 DIAGNOSIS — K5900 Constipation, unspecified: Secondary | ICD-10-CM

## 2017-02-24 DIAGNOSIS — J439 Emphysema, unspecified: Secondary | ICD-10-CM

## 2017-02-24 DIAGNOSIS — M4856XA Collapsed vertebra, not elsewhere classified, lumbar region, initial encounter for fracture: Secondary | ICD-10-CM | POA: Diagnosis present

## 2017-02-24 DIAGNOSIS — Z951 Presence of aortocoronary bypass graft: Secondary | ICD-10-CM

## 2017-02-24 DIAGNOSIS — Z6820 Body mass index (BMI) 20.0-20.9, adult: Secondary | ICD-10-CM

## 2017-02-24 DIAGNOSIS — I252 Old myocardial infarction: Secondary | ICD-10-CM

## 2017-02-24 DIAGNOSIS — I10 Essential (primary) hypertension: Secondary | ICD-10-CM | POA: Diagnosis present

## 2017-02-24 DIAGNOSIS — Z9181 History of falling: Secondary | ICD-10-CM

## 2017-02-24 DIAGNOSIS — R739 Hyperglycemia, unspecified: Secondary | ICD-10-CM | POA: Diagnosis present

## 2017-02-24 DIAGNOSIS — J449 Chronic obstructive pulmonary disease, unspecified: Secondary | ICD-10-CM | POA: Diagnosis present

## 2017-02-24 DIAGNOSIS — I4891 Unspecified atrial fibrillation: Secondary | ICD-10-CM | POA: Diagnosis present

## 2017-02-24 DIAGNOSIS — I5032 Chronic diastolic (congestive) heart failure: Secondary | ICD-10-CM | POA: Diagnosis present

## 2017-02-24 DIAGNOSIS — S32000A Wedge compression fracture of unspecified lumbar vertebra, initial encounter for closed fracture: Secondary | ICD-10-CM | POA: Diagnosis present

## 2017-02-24 DIAGNOSIS — R197 Diarrhea, unspecified: Secondary | ICD-10-CM | POA: Diagnosis present

## 2017-02-24 LAB — BASIC METABOLIC PANEL
ANION GAP: 12 (ref 5–15)
BUN: 9 mg/dL (ref 6–20)
CO2: 26 mmol/L (ref 22–32)
Calcium: 9.5 mg/dL (ref 8.9–10.3)
Chloride: 99 mmol/L — ABNORMAL LOW (ref 101–111)
Creatinine, Ser: 1.23 mg/dL — ABNORMAL HIGH (ref 0.44–1.00)
GFR calc Af Amer: 47 mL/min — ABNORMAL LOW (ref >60)
GFR, EST NON AFRICAN AMERICAN: 40 mL/min — AB (ref >60)
GLUCOSE: 163 mg/dL — AB (ref 65–99)
POTASSIUM: 3.9 mmol/L (ref 3.5–5.1)
Sodium: 137 mmol/L (ref 135–145)

## 2017-02-24 LAB — CBC WITH DIFFERENTIAL/PLATELET
BASOS ABS: 0 10*3/uL (ref 0.0–0.1)
Basophils Relative: 0 %
EOS PCT: 0 %
Eosinophils Absolute: 0 10*3/uL (ref 0.0–0.7)
HCT: 36.4 % (ref 36.0–46.0)
Hemoglobin: 12 g/dL (ref 12.0–15.0)
LYMPHS ABS: 1.2 10*3/uL (ref 0.7–4.0)
LYMPHS PCT: 11 %
MCH: 30.6 pg (ref 26.0–34.0)
MCHC: 33 g/dL (ref 30.0–36.0)
MCV: 92.9 fL (ref 78.0–100.0)
MONOS PCT: 8 %
Monocytes Absolute: 0.8 10*3/uL (ref 0.1–1.0)
NEUTROS ABS: 8.6 10*3/uL — AB (ref 1.7–7.7)
Neutrophils Relative %: 81 %
PLATELETS: 315 10*3/uL (ref 150–400)
RBC: 3.92 MIL/uL (ref 3.87–5.11)
RDW: 13.4 % (ref 11.5–15.5)
WBC: 10.7 10*3/uL — ABNORMAL HIGH (ref 4.0–10.5)

## 2017-02-24 LAB — MRSA PCR SCREENING: MRSA by PCR: NEGATIVE

## 2017-02-24 MED ORDER — ACETAMINOPHEN 325 MG PO TABS
650.0000 mg | ORAL_TABLET | Freq: Four times a day (QID) | ORAL | Status: DC | PRN
  Administered 2017-02-24: 650 mg via ORAL
  Filled 2017-02-24: qty 2

## 2017-02-24 MED ORDER — ONDANSETRON HCL 4 MG PO TABS: 4.0000 mg | ORAL_TABLET | Freq: Four times a day (QID) | ORAL | Status: DC | PRN

## 2017-02-24 MED ORDER — POLYETHYLENE GLYCOL 3350 17 G PO PACK
17.0000 g | PACK | Freq: Every day | ORAL | Status: DC | PRN
Start: 2017-02-24 — End: 2017-02-26

## 2017-02-24 MED ORDER — OLANZAPINE 2.5 MG PO TABS
2.5000 mg | ORAL_TABLET | Freq: Every evening | ORAL | Status: DC
  Administered 2017-02-24 – 2017-02-25 (×2): 2.5 mg via ORAL
  Filled 2017-02-24 (×3): qty 1

## 2017-02-24 MED ORDER — DOCUSATE SODIUM 100 MG PO CAPS
100.0000 mg | ORAL_CAPSULE | Freq: Two times a day (BID) | ORAL | Status: DC
  Administered 2017-02-26: 100 mg via ORAL
  Filled 2017-02-24 (×4): qty 1

## 2017-02-24 MED ORDER — ENSURE ENLIVE PO LIQD
237.0000 mL | Freq: Two times a day (BID) | ORAL | Status: DC
  Administered 2017-02-25 – 2017-02-26 (×3): 237 mL via ORAL

## 2017-02-24 MED ORDER — SODIUM CHLORIDE 0.9 % IV BOLUS (SEPSIS)
1000.0000 mL | Freq: Once | INTRAVENOUS | Status: AC
  Administered 2017-02-24: 1000 mL via INTRAVENOUS

## 2017-02-24 MED ORDER — HYDRALAZINE HCL 20 MG/ML IJ SOLN: 2.0000 mg | Freq: Four times a day (QID) | INTRAMUSCULAR | Status: DC | PRN

## 2017-02-24 MED ORDER — TRAMADOL HCL 50 MG PO TABS
50.0000 mg | ORAL_TABLET | Freq: Four times a day (QID) | ORAL | Status: DC | PRN
  Administered 2017-02-25 (×2): 50 mg via ORAL
  Filled 2017-02-24 (×2): qty 1

## 2017-02-24 MED ORDER — ASPIRIN EC 81 MG PO TBEC
81.0000 mg | DELAYED_RELEASE_TABLET | Freq: Every day | ORAL | Status: DC
  Administered 2017-02-24 – 2017-02-26 (×3): 81 mg via ORAL
  Filled 2017-02-24 (×3): qty 1

## 2017-02-24 MED ORDER — METHOCARBAMOL 500 MG PO TABS: 500.0000 mg | ORAL_TABLET | Freq: Three times a day (TID) | ORAL | Status: DC | PRN

## 2017-02-24 MED ORDER — ACETAMINOPHEN 650 MG RE SUPP: 650.0000 mg | Freq: Four times a day (QID) | RECTAL | Status: DC | PRN

## 2017-02-24 MED ORDER — FLUTICASONE PROPIONATE 50 MCG/ACT NA SUSP
1.0000 | Freq: Every day | NASAL | Status: DC
  Administered 2017-02-24 – 2017-02-26 (×3): 1 via NASAL
  Filled 2017-02-24: qty 16

## 2017-02-24 MED ORDER — SERTRALINE HCL 25 MG PO TABS
25.0000 mg | ORAL_TABLET | Freq: Every morning | ORAL | Status: DC
  Administered 2017-02-25 – 2017-02-26 (×2): 25 mg via ORAL
  Filled 2017-02-24 (×2): qty 1

## 2017-02-24 MED ORDER — SODIUM CHLORIDE 0.9% FLUSH
3.0000 mL | Freq: Two times a day (BID) | INTRAVENOUS | Status: DC
  Administered 2017-02-25 – 2017-02-26 (×2): 3 mL via INTRAVENOUS

## 2017-02-24 MED ORDER — ATORVASTATIN CALCIUM 40 MG PO TABS
40.0000 mg | ORAL_TABLET | Freq: Every day | ORAL | Status: DC
  Administered 2017-02-24 – 2017-02-26 (×3): 40 mg via ORAL
  Filled 2017-02-24 (×3): qty 1

## 2017-02-24 MED ORDER — ENOXAPARIN SODIUM 40 MG/0.4ML ~~LOC~~ SOLN
40.0000 mg | SUBCUTANEOUS | Status: DC
  Administered 2017-02-24 – 2017-02-25 (×2): 40 mg via SUBCUTANEOUS
  Filled 2017-02-24 (×2): qty 0.4

## 2017-02-24 MED ORDER — ONDANSETRON HCL 4 MG/2ML IJ SOLN
4.0000 mg | Freq: Four times a day (QID) | INTRAMUSCULAR | Status: DC | PRN
  Administered 2017-02-25: 4 mg via INTRAVENOUS
  Filled 2017-02-24: qty 2

## 2017-02-24 MED ORDER — FLUDROCORTISONE ACETATE 0.1 MG PO TABS
0.2000 mg | ORAL_TABLET | Freq: Every day | ORAL | Status: DC
  Administered 2017-02-25 – 2017-02-26 (×2): 0.2 mg via ORAL
  Filled 2017-02-24 (×2): qty 2

## 2017-02-24 MED ORDER — SODIUM CHLORIDE 0.9 % IV SOLN: INTRAVENOUS | Status: AC

## 2017-02-24 NOTE — ED Notes (Signed)
Patient declined new IV for CT.  Stated that she does not want the CT scan.  EDP made aware.

## 2017-02-24 NOTE — Progress Notes (Signed)
Admitted pt from ED. VS taken. Telemetry on. Swab pt for MRSA. Isolated pt for enteric prec as ordered.. Sacral foam applied . Pt stated have not had BM since yesterday 02/24/2017.

## 2017-02-24 NOTE — H&P (Signed)
History and Physical    Anjel Perfetti ZOX:[id number] DOB: 1935/04/18 DOA: 02/24/2017  PCP: Rocky Morel, MD Patient coming from: home  Chief Complaint: syncope  HPI: Tonya Payne is a very pleasant 82 y.o. female with medical history significant for orthostatic dizziness/hypotension, A. fib, CAD, frequent falls,AAA presents to the emergency department from her rehab center with chief complaint of syncope.  Initial evaluation reveals orthostatic hypotension and some acute on chronic kidney disease.  [facility] hospitalist asked to admit for observation.  Information is obtained from the chart and the patient.  She was admitted 10 days ago for same.  She is known to have long-standing orthostatic hypotension.  Today she states she was in the wheelchair went to the gym for exercises.  She stood up to walk with her physical therapist" out and went".  She did not hit her head.  No injury reported.  Associated symptoms include recent diarrhea.  Reports 3 episodes over the last day or so.  Describes it as watery light brown.  Denies any dark stool or bloody stool.  He also reports chronic abdominal "tenderness".  She denies nausea vomiting or headache dysuria hematuria frequency urgency.  She denies any chest pain palpitation shortness of breath cough fever chills. She has a known AAA reports she has not got any vascular surgery appointments and is not interested in [employer].  She believes she has been not eating her normal amount as the "food at the center is bad".  Of note patient is in rehab facility due to fall and compression fracture of her spine.    ED Course: In the emergency department she is afebrile and orthostatic after normal saline.  His not hypoxic.  Review of Systems: As per HPI otherwise all other systems reviewed and are negative.   Ambulatory Status: She is mostly wheelchair-bound.  Past Medical History:  Diagnosis Date  . AAA (abdominal aortic aneurysm) (HCC)   . AAA  (abdominal aortic aneurysm) without rupture (HCC) 2016   CTA 10/23/2015 - saccular infrarenal aneurysm roughly 4.8 cm in diameter. Stable from 2016 -- Dr. Darrick Penna  . Anterior wall myocardial infarction (HCC) 10/08/2015   Presented with severe chest pain and dynamic anterior ST elevations/biphasic elevations area did not meet full criteria for STEMI, and troponin was not dramatically elevated. 95% LAD treated with PTCA followed by CABG  . Anxiety   . Atrial fibrillation, new onset (HCC) 10/2015   after CABG, in SR at d/c  . CAD (coronary artery disease), native coronary artery 10/08/2015   Severe coronary disease with calcified left main and LAD: 5% left main going into ostial LAD. Mid LAD 95% (treated with PTCA). Proximal RCA 50%, calcified. EF was 20-25% with apical akinesis and distal anterior hypokinesis. --> Referred for CABG  . CAP (community acquired pneumonia) 07/2013   Hattie Perch 07/09/2013  . Congestive dilated cardiomyopathy (HCC) 10/08/2015   Intra-Op TEE showed EF of 40 and 45%. She had an echo with a "normal LV function "documented, but there was no reading M.D.  . COPD (chronic obstructive pulmonary disease) (HCC)   . Depression   . Headache    "weekly" (12/31/2016)  . Hyperlipidemia LDL goal <70 10/08/2015  . IBS (irritable bowel syndrome)   . Migraine    "none in years" (12/31/2016)  . Oral thrush   . Post PTCA 10/08/2015   Presented with STEMI, severe mid LAD lesion treated with PTCA and then referred for CABG.  . Syncope   . TIA (transient ischemic attack)  Past Surgical History:  Procedure Laterality Date  . ABDOMINAL HYSTERECTOMY    . BACK SURGERY    . CARDIAC CATHETERIZATION N/A 10/06/2015   Procedure: Left Heart Cath and Coronary Angiography;  Surgeon: Marykay Lex, MD;  Location: [facility] INVASIVE CV LAB;  Service: Cardiovascular;: Heavily calcified left main-LAD. 75% LM & Ost LAD, 95% d-mLAD, p-mRCA Calcified 50%. EF ~20-25%  . CARDIAC CATHETERIZATION N/A 10/06/2015    Procedure: Coronary Balloon Angioplasty;  Surgeon: Marykay Lex, MD;  Location: [facility] INVASIVE CV LAB;  Service: Cardiovascular: PTCA of 95% LAD in setting of STEMI - reduced to 70% --> would need Atherectomy-PCI vs. CABG.  Sent for CABG.  . COLECTOMY    . CORONARY ARTERY BYPASS GRAFT N/A 10/16/2015   Procedure: CORONARY ARTERY BYPASS GRAFTING (CABG)  x three, using left internal mammary artery and right leg greater saphenous vein harvested endoscopically;  Surgeon: Loreli Slot, MD;  Location: [facility] OR;  Service: Open Heart Surgery;  Laterality: N/A;  . FRACTURE SURGERY    . HIP PINNING,CANNULATED Left 09/24/2016   Procedure: CANNULATED HIP PINNING LEFT HIP;  Surgeon: Durene Romans, MD;  Location: [facility] ORS;  Service: Orthopedics;  Laterality: Left;  . LUMBAR DISC SURGERY     L3,4,5; S1"  . NECK SURGERY    . TEE WITHOUT CARDIOVERSION N/A 10/16/2015   Procedure: TRANSESOPHAGEAL ECHOCARDIOGRAM (TEE);  Surgeon: Loreli Slot, MD;  Location: [facility] OR;  Service: Open Heart Surgery - IntraOp:  EF 40-45%.  . TONSILLECTOMY    . TOTAL HIP ARTHROPLASTY Right     Social History   Social History  . Marital status: Widowed    Spouse name: N/A  . Number of children: N/A  . Years of education: N/A   Occupational History  . Not on file.   Social History Main Topics  . Smoking status: Former Smoker    Packs/day: 0.50    Years: 50.00    Types: Cigarettes    Quit date: 04/11/2015  . Smokeless tobacco: Never Used  . Alcohol use Yes     Comment: 12/31/2016 "glass of wine maybe once/month"  . Drug use: No  . Sexual activity: No   Other Topics Concern  . Not on file   Social History Narrative  . No narrative on file    No Known Allergies  Family History  Problem Relation Age of Onset  . Stroke Mother        dead  . Clotting disorder Father        dead  bone marrow disease    Prior to Admission medications   Medication Sig Start Date End Date Taking? Authorizing Provider  aspirin  EC 81 MG tablet Take 81 mg by mouth daily.    [provider]  atorvastatin (LIPITOR) 40 MG tablet Take 1 tablet (40 mg total) by mouth daily at 6 PM. Patient taking differently: Take 40 mg by mouth daily.  12/18/15   Barrett, Erin R, PA-C  docusate sodium (COLACE) 100 MG capsule Take 1 capsule (100 mg total) by mouth 2 (two) times daily. 09/24/16   Lanney Gins, PA-C  fexofenadine (ALLEGRA) 180 MG tablet Take 180 mg by mouth every evening.     [provider]  fludrocortisone (FLORINEF) 0.1 MG tablet Take 2 tablets (0.2 mg total) by mouth daily. 02/15/17   Rolly Salter, MD  fluticasone (FLONASE) 50 MCG/ACT nasal spray Place 1 spray into both nostrils daily. 11/16/16   Vassie Loll, MD  megestrol (MEGACE)  40 MG/ML suspension Take 800 mg by mouth daily as needed for other. appetite 11/29/16   [provider]  methocarbamol (ROBAXIN) 500 MG tablet Take 1 tablet (500 mg total) by mouth every 8 (eight) hours as needed for muscle spasms. 01/01/17   Maxie Barb, MD  Multiple Vitamins-Minerals (MULTIVITAMIN ADULTS PO) Take 1 tablet by mouth daily.     [provider]  OLANZapine (ZYPREXA) 2.5 MG tablet Take 1 tablet (2.5 mg total) by mouth every evening. 02/14/17   Rolly Salter, MD  polyethylene glycol [facility] Ethelene Hal) packet Take 17 g by mouth daily as needed. Patient taking differently: Take 17 g by mouth daily as needed for mild constipation.  01/01/17   Maxie Barb, MD  sertraline (ZOLOFT) 50 MG tablet Take 0.5 tablets (25 mg total) by mouth every morning. 11/16/16   Vassie Loll, MD  traMADol (ULTRAM) 50 MG tablet Take 1 tablet (50 mg total) by mouth every 6 (six) hours as needed for moderate pain. 02/14/17   Rolly Salter, MD    Physical Exam: Vitals:   02/24/17 1233 02/24/17 1235 02/24/17 1415 02/24/17 1430  BP:  (!) 114/98 126/89 133/73  Pulse:  (!) 109 95 95  Resp:  18 20 19   Temp:  98 F (36.7 C)    TempSrc:  Oral      SpO2: 95% 96% 99% 99%     General:  Appears calm and comfortable and somewhat frail-appearing Eyes:  PERRL, EOMI, normal lids, iris ENT:  grossly normal hearing, lips & tongue, mucous membranes of her mouth are moist and pink Neck:  no LAD, masses or thyromegaly Cardiovascular:  RRR, no m/r/g. No LE edema.  Respiratory:  CTA bilaterally, no w/r/r. Normal respiratory effort. Abdomen:  soft, ntnd, positive bowel sounds throughout no guarding or rebounding Skin:  no rash or induration seen on limited exam Musculoskeletal:  grossly normal tone BUE/BLE, good ROM, no bony abnormality Psychiatric:  grossly normal mood and affect, speech fluent and appropriate, AOx3 Neurologic:  CN 2-12 grossly intact, moves all extremities in coordinated fashion, sensation intact  Labs on Admission: I have personally reviewed following labs and imaging studies  CBC:  Recent Labs Lab 02/24/17 1305  WBC 10.7*  NEUTROABS 8.6*  HGB 12.0  HCT 36.4  MCV 92.9  PLT 315   Basic Metabolic Panel:  Recent Labs Lab 02/24/17 1305  NA 137  K 3.9  CL 99*  CO2 26  GLUCOSE 163*  BUN 9  CREATININE 1.23*  CALCIUM 9.5   GFR: Estimated Creatinine Clearance: 26.2 mL/min (A) (by C-G formula based on SCr of 1.23 mg/dL (H)). Liver Function Tests: No results for input(s): AST, ALT, ALKPHOS, BILITOT, PROT, ALBUMIN in the last 168 hours. No results for input(s): LIPASE, AMYLASE in the last 168 hours. No results for input(s): AMMONIA in the last 168 hours. Coagulation Profile: No results for input(s): INR, PROTIME in the last 168 hours. Cardiac Enzymes: No results for input(s): CKTOTAL, CKMB, CKMBINDEX, TROPONINI in the last 168 hours. BNP (last 3 results) No results for input(s): PROBNP in the last 8760 hours. HbA1C: No results for input(s): HGBA1C in the last 72 hours. CBG: No results for input(s): GLUCAP in the last 168 hours. Lipid Profile: No results for input(s): CHOL, HDL, LDLCALC, TRIG, CHOLHDL,  LDLDIRECT in the last 72 hours. Thyroid Function Tests: No results for input(s): TSH, T4TOTAL, FREET4, T3FREE, THYROIDAB in the last 72 hours. Anemia Panel: No results for input(s): VITAMINB12, FOLATE,  FERRITIN, TIBC, IRON, RETICCTPCT in the last 72 hours. Urine analysis:    Component Value Date/Time   COLORURINE YELLOW 01/29/2017 2129   APPEARANCEUR HAZY (A) 01/29/2017 2129   LABSPEC 1.016 01/29/2017 2129   PHURINE 7.0 01/29/2017 2129   GLUCOSEU NEGATIVE 01/29/2017 2129   HGBUR NEGATIVE 01/29/2017 2129   BILIRUBINUR NEGATIVE 01/29/2017 2129   KETONESUR NEGATIVE 01/29/2017 2129   PROTEINUR 30 (A) 01/29/2017 2129   UROBILINOGEN 0.2 02/25/2015 1826   NITRITE NEGATIVE 01/29/2017 2129   LEUKOCYTESUR MODERATE (A) 01/29/2017 2129    Creatinine Clearance: Estimated Creatinine Clearance: 26.2 mL/min (A) (by C-G formula based on SCr of 1.23 mg/dL (H)).  Sepsis Labs: @LABRCNTIP (procalcitonin:4,lacticidven:4) )No results found for this or any previous visit (from the past 240 hour(s)).   Radiological Exams on Admission: Dg Chest Port 1v Same Day  Result Date: 02/24/2017 CLINICAL DATA:  Syncope. EXAM: PORTABLE CHEST 1 VIEW COMPARISON:  01/29/2017 and chest CT 02/11/2017 FINDINGS: Emphysematous changes with chronic interstitial or reticular densities at the lung bases. Chronic densities along the periphery of the mid right lung. No new airspace disease or consolidation. Post CABG changes. Atherosclerotic calcifications at the aortic arch. Surgical plate in the lower cervical spine. Heart size is normal. IMPRESSION: Chronic lung changes with emphysema.  No acute chest findings. Electronically Signed   By: Richarda Overlie M.D.   On: 02/24/2017 16:42    EKG: Independently reviewed. Sinus rhythm Indeterminate axis Nonspecific T abnormalities, lateral leads no significant change since Feb 12 2017  Assessment/Plan Principal Problem:   Syncope Active Problems:   Hypertension   CKD (chronic kidney  disease), stage III (HCC)   COPD (chronic obstructive pulmonary disease) (HCC)   CAD (coronary artery disease)   Orthostatic hypotension   Hyperglycemia   Diarrhea   Compression fx, lumbar spine (HCC)   #1.  Syncope.  Recurrent.  Extensive workup in the past reveals orthostatic hypotension.  Recently hospitalized for same.  She has had multiple frequent episodes with falls pelvic fractures.  No abnormality on telemetry.  No metabolic derangement.  EKG as noted above.  Initial troponin negative.  Neuro exam benign.  She is provided with 1 L normal saline in the emergency department -Admit for observation to telemetry -Continue with gentle IV fluids -Discharge summary indicates medications adjusted specifically zyprexa titrated down and florinef increased -Review indicates zyprexa at lowest dose and florinef at highest dose  #2.  Orthostatic hypotension.  Patient with known orthostasis.  Received 1 L normal saline department.  He remained orthostatic -Continue IV fluids -continue florinef -Recheck in the morning  #3.  Chronic diastolic heart failure.  Echo in 2018 reveals an EF 60-65% with grade 1 diastolic dysfunction.  She is on Lopressor in the past.   -Not on any antihypertensives for now. -Home medications include no diuretics either related to above  4.  Diarrhea.  Patient reports several episodes of loose stool over the last day or 2.  She is currently resident in rehab center.  No fever no abdominal pain no nausea or vomiting -Stool sample for C. difficile PCR -Monitor intake and output  #5.  Chronic kidney disease.  Stage II.  Creatinine 1.23 on admission.  10 days ago creatinine 0.78 -Monitor urine output -Hold nephrotoxins -Gentle IV fluids -Recheck in the morning  #6.  Hyperglycemia.  likely steroid-induced.  Serum glucose 163 on admission. -Obtain a hemoglobin A1c -Sliding scale insulin for optimal control  #7.  hypertension.  When patient is lying down blood pressure  tends to elevate.  Chart summary of most recent hospitalization notes recommending avoiding any antihypertensive medications contrary to #2  8.  COPD.  Not on home oxygen.  Appears to be stable at baseline.  Oxygen saturation level greater than 90% on room air -Continue home meds  9.  CAD.  No chest pain.  Initial troponin negative.  EKG as noted above -continue home meds  #10.  Compression fracture/pelvic fracture from previous falls.  This is while she is in a rehab facility.  Appears stable at baseline -Continue home meds -physical therapy  DVT prophylaxis: lovenox  Code Status: full  Family Communication: none present  Disposition Plan: back to facility  Consults called: none  Admission status: obs    Gwenyth BenderBLACK,Carver Murakami M MD [facility]  If 7PM-7AM, please contact night-coverage [email or web address] Password [email or web address]  02/24/2017, 4:55 PM

## 2017-02-24 NOTE — ED Triage Notes (Signed)
Pt presents from [facility] with report of witnessed syncopal episode.  Pt reports she was exercising at the time, reports h/o syncope.  Pt denies any injury, denies any pain.  Per EMS, on their arrival, pt was pale with nausea - zofran 4mg  given IVP.

## 2017-02-24 NOTE — ED Provider Notes (Signed)
[facility] Provider Note   CSN: [id number] Arrival date & time: 02/24/17  1233     History   Chief Complaint Chief Complaint  Patient presents with  . Loss of Consciousness    HPI Tonya Payne is a 82 y.o. female.  HPI  82 year old female presents with syncope.  This is a recurrent issue for her.  She was in an exercise class and was going from a seated to standing position when she got lightheaded and passed out.  She vomited.  She denies any injuries or current new pain.  She always has abdominal tenderness but over the last couple days has had increased abdominal pain.  She is also been having diarrhea.  She describes this as loose and watery, no blood.  Previous to this she was constipated and states the abdominal pain is actually improving.  No headache, chest pain, shortness of breath.  She has a long-standing history of orthostatic hypotension.  She has a known AAA but states that she has not gone to any vascular surgery appointments and keeps missing appointments.  Past Medical History:  Diagnosis Date  . AAA (abdominal aortic aneurysm) (HCC)   . AAA (abdominal aortic aneurysm) without rupture (HCC) 2016   CTA 10/23/2015 - saccular infrarenal aneurysm roughly 4.8 cm in diameter. Stable from 2016 -- Dr. Darrick PennaFields  . Anterior wall myocardial infarction (HCC) 10/08/2015   Presented with severe chest pain and dynamic anterior ST elevations/biphasic elevations area did not meet full criteria for STEMI, and troponin was not dramatically elevated. 95% LAD treated with PTCA followed by CABG  . Anxiety   . Atrial fibrillation, new onset (HCC) 10/2015   after CABG, in SR at d/c  . CAD (coronary artery disease), native coronary artery 10/08/2015   Severe coronary disease with calcified left main and LAD: 5% left main going into ostial LAD. Mid LAD 95% (treated with PTCA). Proximal RCA 50%, calcified. EF was 20-25% with apical akinesis and distal  anterior hypokinesis. --> Referred for CABG  . CAP (community acquired pneumonia) 07/2013   Hattie Perch/notes 07/09/2013  . Congestive dilated cardiomyopathy (HCC) 10/08/2015   Intra-Op TEE showed EF of 40 and 45%. She had an echo with a "normal LV function "documented, but there was no reading M.D.  . COPD (chronic obstructive pulmonary disease) (HCC)   . Depression   . Headache    "weekly" (12/31/2016)  . Hyperlipidemia LDL goal <70 10/08/2015  . IBS (irritable bowel syndrome)   . Migraine    "none in years" (12/31/2016)  . Oral thrush   . Post PTCA 10/08/2015   Presented with STEMI, severe mid LAD lesion treated with PTCA and then referred for CABG.  . Syncope   . TIA (transient ischemic attack)     Patient Active Problem List   Diagnosis Date Noted  . Constipation 02/02/2017  . Hypomagnesemia 01/31/2017  . Orthostatic hypotension 01/31/2017  . Syncope 01/29/2017  . Nausea vomiting and diarrhea 01/29/2017  . Hypertensive urgency 01/29/2017  . COPD (chronic obstructive pulmonary disease) (HCC) 12/30/2016  . Closed compression fracture of thoracic vertebra (HCC) 12/30/2016  . Abdominal pain 12/30/2016  . Alcohol use 12/30/2016  . CAD (coronary artery disease) 12/30/2016  . Scalp laceration   . Acute renal failure superimposed on stage 3 chronic kidney disease (HCC)   . Dehydration   . Physical deconditioning   . Pressure injury of skin 11/12/2016  . CKD (chronic kidney disease), stage III (HCC) 09/24/2016  .  Closed left hip fracture, with routine healing, subsequent encounter 09/23/2016  . Hyponatremia 09/23/2016  . Hypertension 09/23/2016  . Left displaced femoral neck fracture (HCC)   . Closed fracture of multiple pubic rami, right, sequela 02/27/2016  . Displaced fracture of neck of right second metacarpal bone with routine healing 02/27/2016  . Protein-calorie malnutrition, severe (HCC) 02/22/2016  . Orthostatic dizziness 02/22/2016  . Postoperative atrial fibrillation (HCC)  02/22/2016  . Fall   . Hand fracture   . Pelvic fracture (HCC) 01/26/2016  . AAA (abdominal aortic aneurysm) (HCC) 11/30/2015  . Post PTCA   . S/P CABG x 3 10/16/2015  . Coronary artery disease involving native coronary artery with angina pectoris (HCC) 10/08/2015  . Congestive dilated cardiomyopathy (HCC) 10/08/2015  . Hyperlipidemia LDL goal <70 10/08/2015  . E. coli UTI (urinary tract infection) 10/06/2015  . Myocardial infarction involving left anterior descending (LAD) coronary artery (HCC) 10/06/2015    Past Surgical History:  Procedure Laterality Date  . ABDOMINAL HYSTERECTOMY    . BACK SURGERY    . CARDIAC CATHETERIZATION N/A 10/06/2015   Procedure: Left Heart Cath and Coronary Angiography;  Surgeon: Marykay Lex, MD;  Location: [facility] INVASIVE CV LAB;  Service: Cardiovascular;: Heavily calcified left main-LAD. 75% LM & Ost LAD, 95% d-mLAD, p-mRCA Calcified 50%. EF ~20-25%  . CARDIAC CATHETERIZATION N/A 10/06/2015   Procedure: Coronary Balloon Angioplasty;  Surgeon: Marykay Lex, MD;  Location[facility] INVASIVE CV LAB;  Service: Cardiovascular: PTCA of 95% LAD in setting of STEMI - reduced to 70% --> would need Atherectomy-PCI vs. CABG.  Sent for CABG.  . COLECTOMY    . CORONARY ARTERY BYPASS GRAFT N/A 10/16/2015   Procedure: CORONARY ARTERY BYPASS GRAFTING (CABG)  x three, using left internal mammary artery and right leg greater saphenous vein harvested endoscopically;  Surgeon: Loreli Slot, MD;  Location: [facility] OR;  Service: Open Heart Surgery;  Laterality: N/A;  . FRACTURE SURGERY    . HIP PINNING,CANNULATED Left 09/24/2016   Procedure: CANNULATED HIP PINNING LEFT HIP;  Surgeon: Durene Romans, MD;  Location: [facility] ORS;  Service: Orthopedics;  Laterality: Left;  . LUMBAR DISC SURGERY     L3,4,5; S1"  . NECK SURGERY    . TEE WITHOUT CARDIOVERSION N/A 10/16/2015   Procedure: TRANSESOPHAGEAL ECHOCARDIOGRAM (TEE);  Surgeon: Loreli Slot, MD;  Location: [facility] OR;  Service: Open  Heart Surgery - IntraOp:  EF 40-45%.  . TONSILLECTOMY    . TOTAL HIP ARTHROPLASTY Right     OB History    No data available       Home Medications    Prior to Admission medications   Medication Sig Start Date End Date Taking? Authorizing Provider  aspirin EC 81 MG tablet Take 81 mg by mouth daily.    [provider]  atorvastatin (LIPITOR) 40 MG tablet Take 1 tablet (40 mg total) by mouth daily at 6 PM. Patient taking differently: Take 40 mg by mouth daily.  12/18/15   Barrett, Erin R, PA-C  docusate sodium (COLACE) 100 MG capsule Take 1 capsule (100 mg total) by mouth 2 (two) times daily. 09/24/16   Lanney Gins, PA-C  feeding supplement, ENSURE ENLIVE, (ENSURE ENLIVE) LIQD Take 237 mLs by mouth 2 (two) times daily between meals. Patient not taking: Reported on 01/29/2017 09/26/16   Marguerita Merles Latif, DO  fexofenadine (ALLEGRA) 180 MG tablet Take 180 mg by mouth every evening.     [provider]  fludrocortisone (FLORINEF) 0.1 MG  tablet Take 2 tablets (0.2 mg total) by mouth daily. 02/15/17   Rolly Salter, MD  fluticasone (FLONASE) 50 MCG/ACT nasal spray Place 1 spray into both nostrils daily. 11/16/16   Vassie Loll, MD  megestrol (MEGACE) 40 MG/ML suspension Take 800 mg by mouth daily as needed for other. appetite 11/29/16   [provider]  methocarbamol (ROBAXIN) 500 MG tablet Take 1 tablet (500 mg total) by mouth every 8 (eight) hours as needed for muscle spasms. 01/01/17   Maxie Barb, MD  Multiple Vitamins-Minerals (MULTIVITAMIN ADULTS PO) Take 1 tablet by mouth daily.     [provider]  OLANZapine (ZYPREXA) 2.5 MG tablet Take 1 tablet (2.5 mg total) by mouth every evening. 02/14/17   Rolly Salter, MD  polyethylene glycol [facility] Ethelene Hal) packet Take 17 g by mouth daily as needed. Patient taking differently: Take 17 g by mouth daily as needed for mild constipation.  01/01/17   Maxie Barb, MD  sertraline  (ZOLOFT) 50 MG tablet Take 0.5 tablets (25 mg total) by mouth every morning. 11/16/16   Vassie Loll, MD  traMADol (ULTRAM) 50 MG tablet Take 1 tablet (50 mg total) by mouth every 6 (six) hours as needed for moderate pain. 02/14/17   Rolly Salter, MD    Family History Family History  Problem Relation Age of Onset  . Stroke Mother        dead  . Clotting disorder Father        dead  bone marrow disease    Social History Social History  Substance Use Topics  . Smoking status: Former Smoker    Packs/day: 0.50    Years: 50.00    Types: Cigarettes    Quit date: 04/11/2015  . Smokeless tobacco: Never Used  . Alcohol use Yes     Comment: 12/31/2016 "glass of wine maybe once/month"     Allergies   Patient has no known allergies.   Review of Systems Review of Systems  Respiratory: Negative for shortness of breath.   Cardiovascular: Negative for chest pain and palpitations.  Gastrointestinal: Positive for abdominal pain and diarrhea. Negative for blood in stool, nausea and vomiting.  Neurological: Positive for syncope and light-headedness.  All other systems reviewed and are negative.    Physical Exam Updated Vital Signs BP 133/73   Pulse 95   Temp 98 F (36.7 C) (Oral)   Resp 19   SpO2 99%   Physical Exam  Constitutional: She is oriented to person, place, and time. She appears well-developed and well-nourished. No distress.  HENT:  Head: Normocephalic and atraumatic.  Right Ear: External ear normal.  Left Ear: External ear normal.  Nose: Nose normal.  Eyes: Right eye exhibits no discharge. Left eye exhibits no discharge.  Neck: Neck supple.  Cardiovascular: Normal rate, regular rhythm and normal heart sounds.   Pulmonary/Chest: Effort normal and breath sounds normal.  Abdominal: Soft. She exhibits pulsatile midline mass. There is tenderness.    Neurological: She is alert and oriented to person, place, and time.  Skin: Skin is warm and dry. She is not  diaphoretic.  Nursing note and vitals reviewed.    ED Treatments / Results  Labs (all labs ordered are listed, but only abnormal results are displayed) Labs Reviewed  BASIC METABOLIC PANEL - Abnormal; Notable for the following:       Result Value   Chloride 99 (*)    Glucose, Bld 163 (*)    Creatinine, Ser  1.23 (*)    GFR calc non Af Amer 40 (*)    GFR calc Af Amer 47 (*)    All other components within normal limits  CBC WITH DIFFERENTIAL/PLATELET - Abnormal; Notable for the following:    WBC 10.7 (*)    Neutro Abs 8.6 (*)    All other components within normal limits  CBG MONITORING, ED    EKG  EKG Interpretation  Date/Time:  Monday February 24 2017 13:46:18 EDT Ventricular Rate:  91 PR Interval:    QRS Duration: 76 QT Interval:  364 QTC Calculation: 448 R Axis:   0 Text Interpretation:  Sinus rhythm Indeterminate axis Nonspecific T abnormalities, lateral leads no significant change since Feb 12 2017 Confirmed by Pricilla Loveless [phone]) on 02/24/2017 2:21:13 PM Also confirmed by Pricilla Loveless [phone]), editor Misty Stanley [phone])  on 02/24/2017 2:58:05 PM       Radiology No results found.  Procedures Procedures (including critical care time)  EMERGENCY DEPARTMENT ULTRASOUND  Study: Limited Retroperitoneal Ultrasound of the Abdominal Aorta.  INDICATIONS:Pulsatile abdominal mass, Abdominal pain and Age>55 Multiple views of the abdominal aorta were obtained in real-time from the diaphragmatic hiatus to the aortic bifurcation in transverse planes with a multi-frequency probe.  PERFORMED BY: Myself IMAGES ARCHIVED?: Yes LIMITATIONS:  Abdominal pain INTERPRETATION:  Abdominal aortic aneurysm present - diameter dimensions 5.9x5.4   Medications Ordered in ED Medications  sodium chloride 0.9 % bolus 1,000 mL (0 mLs Intravenous Stopped 02/24/17 1438)     Initial Impression / Assessment and Plan / ED Course  I have reviewed the triage vital signs and the  nursing notes.  Pertinent labs & imaging results that were available during my care of the patient were reviewed by me and considered in my medical decision making (see chart for details).     Given patient's known large aneurysm and abdominal pain, I discussed CT scan with the patient to help rule out leaking or worsening aneurysm.  My ultrasound seems to correlate with most recent aneurysmal measurements a few weeks ago.  However she declines and after a long discussion with the patient it seems clear that she does not want any type of repair for this.  She understands this could rupture at any time and kill her.  She is at peace with this.  As far as her orthostatic hypotension she was orthostatic in the ED.  She is unable to ambulate or stand because of this.  She also has a bump in her creatinine and states she has not been drinking well or eating well at the facility.  Given IV fluids and will readmit for fluids and dehydration management.  Final Clinical Impressions(s) / ED Diagnoses   Final diagnoses:  Syncope and collapse  Orthostatic hypotension    New Prescriptions New Prescriptions   No medications on file     Pricilla Loveless, MD 02/24/17 1553

## 2017-02-25 DIAGNOSIS — N390 Urinary tract infection, site not specified: Secondary | ICD-10-CM | POA: Diagnosis present

## 2017-02-25 DIAGNOSIS — N183 Chronic kidney disease, stage 3 (moderate): Secondary | ICD-10-CM | POA: Diagnosis present

## 2017-02-25 DIAGNOSIS — Z9181 History of falling: Secondary | ICD-10-CM | POA: Diagnosis not present

## 2017-02-25 DIAGNOSIS — I252 Old myocardial infarction: Secondary | ICD-10-CM | POA: Diagnosis not present

## 2017-02-25 DIAGNOSIS — R55 Syncope and collapse: Secondary | ICD-10-CM | POA: Diagnosis present

## 2017-02-25 DIAGNOSIS — I10 Essential (primary) hypertension: Secondary | ICD-10-CM

## 2017-02-25 DIAGNOSIS — I5032 Chronic diastolic (congestive) heart failure: Secondary | ICD-10-CM | POA: Diagnosis present

## 2017-02-25 DIAGNOSIS — N179 Acute kidney failure, unspecified: Secondary | ICD-10-CM | POA: Diagnosis present

## 2017-02-25 DIAGNOSIS — Z6820 Body mass index (BMI) 20.0-20.9, adult: Secondary | ICD-10-CM | POA: Diagnosis not present

## 2017-02-25 DIAGNOSIS — J449 Chronic obstructive pulmonary disease, unspecified: Secondary | ICD-10-CM | POA: Diagnosis present

## 2017-02-25 DIAGNOSIS — M4856XA Collapsed vertebra, not elsewhere classified, lumbar region, initial encounter for fracture: Secondary | ICD-10-CM | POA: Diagnosis present

## 2017-02-25 DIAGNOSIS — E440 Moderate protein-calorie malnutrition: Secondary | ICD-10-CM | POA: Diagnosis present

## 2017-02-25 DIAGNOSIS — R197 Diarrhea, unspecified: Secondary | ICD-10-CM

## 2017-02-25 DIAGNOSIS — I130 Hypertensive heart and chronic kidney disease with heart failure and stage 1 through stage 4 chronic kidney disease, or unspecified chronic kidney disease: Secondary | ICD-10-CM | POA: Diagnosis present

## 2017-02-25 DIAGNOSIS — I420 Dilated cardiomyopathy: Secondary | ICD-10-CM | POA: Diagnosis present

## 2017-02-25 DIAGNOSIS — I951 Orthostatic hypotension: Secondary | ICD-10-CM | POA: Diagnosis present

## 2017-02-25 DIAGNOSIS — T380X5A Adverse effect of glucocorticoids and synthetic analogues, initial encounter: Secondary | ICD-10-CM | POA: Diagnosis present

## 2017-02-25 DIAGNOSIS — Z951 Presence of aortocoronary bypass graft: Secondary | ICD-10-CM | POA: Diagnosis not present

## 2017-02-25 DIAGNOSIS — M4856XD Collapsed vertebra, not elsewhere classified, lumbar region, subsequent encounter for fracture with routine healing: Secondary | ICD-10-CM | POA: Diagnosis present

## 2017-02-25 DIAGNOSIS — I2510 Atherosclerotic heart disease of native coronary artery without angina pectoris: Secondary | ICD-10-CM | POA: Diagnosis present

## 2017-02-25 DIAGNOSIS — R739 Hyperglycemia, unspecified: Secondary | ICD-10-CM

## 2017-02-25 DIAGNOSIS — I4891 Unspecified atrial fibrillation: Secondary | ICD-10-CM | POA: Diagnosis present

## 2017-02-25 LAB — BASIC METABOLIC PANEL
Anion gap: 9 (ref 5–15)
BUN: 7 mg/dL (ref 6–20)
CHLORIDE: 102 mmol/L (ref 101–111)
CO2: 24 mmol/L (ref 22–32)
CREATININE: 0.93 mg/dL (ref 0.44–1.00)
Calcium: 9.1 mg/dL (ref 8.9–10.3)
GFR calc Af Amer: >60 mL/min (ref >60)
GFR, EST NON AFRICAN AMERICAN: 57 mL/min — AB (ref >60)
Glucose, Bld: 87 mg/dL (ref 65–99)
Potassium: 3.3 mmol/L — ABNORMAL LOW (ref 3.5–5.1)
SODIUM: 135 mmol/L (ref 135–145)

## 2017-02-25 LAB — URINALYSIS, ROUTINE W REFLEX MICROSCOPIC
BILIRUBIN URINE: NEGATIVE
GLUCOSE, UA: NEGATIVE mg/dL
HGB URINE DIPSTICK: NEGATIVE
Ketones, ur: NEGATIVE mg/dL
Nitrite: NEGATIVE
PH: 6 (ref 5.0–8.0)
Protein, ur: 30 mg/dL — AB
SPECIFIC GRAVITY, URINE: 1.011 (ref 1.005–1.030)

## 2017-02-25 LAB — CBC
HCT: 34 % — ABNORMAL LOW (ref 36.0–46.0)
Hemoglobin: 10.8 g/dL — ABNORMAL LOW (ref 12.0–15.0)
MCH: 29.3 pg (ref 26.0–34.0)
MCHC: 31.8 g/dL (ref 30.0–36.0)
MCV: 92.4 fL (ref 78.0–100.0)
PLATELETS: 322 10*3/uL (ref 150–400)
RBC: 3.68 MIL/uL — ABNORMAL LOW (ref 3.87–5.11)
RDW: 13.4 % (ref 11.5–15.5)
WBC: 7.4 10*3/uL (ref 4.0–10.5)

## 2017-02-25 NOTE — NC FL2 (Signed)
[place] MEDICAID FL2 LEVEL OF CARE SCREENING TOOL     IDENTIFICATION  Patient Name: Tonya Payne Birthdate: 14-Sep-1934 Sex: female Admission Date (Current Location): 02/24/2017  [facility] [place]   Facility and Address:  [facility] [place], [address], [place], [place] [address]      Provider Number: [id number]  Attending Physician Name and Address:  Darlin DropHall, Carole N, DO  Relative Name and Phone Number:       Current Level of Care: Hospital Recommended Level of Care: Skilled Nursing Facility Prior Approval Number:    Date Approved/Denied:   PASRR Number: [id number]  Discharge Plan: SNF    Current Diagnoses: Patient Active Problem List   Diagnosis Date Noted  . Hyperglycemia 02/24/2017  . Diarrhea 02/24/2017  . Compression fx, lumbar spine (HCC) 02/24/2017  . Constipation 02/02/2017  . Hypomagnesemia 01/31/2017  . Orthostatic hypotension 01/31/2017  . Syncope and collapse 01/29/2017  . Nausea vomiting and diarrhea 01/29/2017  . Hypertensive urgency 01/29/2017  . COPD (chronic obstructive pulmonary disease) (HCC) 12/30/2016  . Closed compression fracture of thoracic vertebra (HCC) 12/30/2016  . Abdominal pain 12/30/2016  . Alcohol use 12/30/2016  . CAD (coronary artery disease) 12/30/2016  . Scalp laceration   . Acute renal failure superimposed on stage 3 chronic kidney disease (HCC)   . Dehydration   . Physical deconditioning   . Pressure injury of skin 11/12/2016  . CKD (chronic kidney disease), stage III (HCC) 09/24/2016  . Closed left hip fracture, with routine healing, subsequent encounter 09/23/2016  . Hyponatremia 09/23/2016  . Hypertension 09/23/2016  . Left displaced femoral neck fracture (HCC)   . Closed fracture of multiple pubic rami, right, sequela 02/27/2016  . Displaced fracture of neck of right second metacarpal bone with routine healing 02/27/2016  . Protein-calorie malnutrition, severe (HCC)  02/22/2016  . Orthostatic dizziness 02/22/2016  . Postoperative atrial fibrillation (HCC) 02/22/2016  . Fall   . Hand fracture   . Pelvic fracture (HCC) 01/26/2016  . AAA (abdominal aortic aneurysm) (HCC) 11/30/2015  . Post PTCA   . S/P CABG x 3 10/16/2015  . Coronary artery disease involving native coronary artery with angina pectoris (HCC) 10/08/2015  . Congestive dilated cardiomyopathy (HCC) 10/08/2015  . Hyperlipidemia LDL goal <70 10/08/2015  . E. coli UTI (urinary tract infection) 10/06/2015  . Myocardial infarction involving left anterior descending (LAD) coronary artery (HCC) 10/06/2015    Orientation RESPIRATION BLADDER Height & Weight     Self, Time, Situation, Place  Normal Incontinent, External catheter Weight: 106 lb 7.7 oz (48.3 kg) Height:  5' (152.4 cm)  BEHAVIORAL SYMPTOMS/MOOD NEUROLOGICAL BOWEL NUTRITION STATUS   (None)  (None) Continent Diet (Heart healthy/carb modified)  AMBULATORY STATUS COMMUNICATION OF NEEDS Skin   Extensive Assist Verbally PU Stage and Appropriate Care, Other (Comment) (MASD) PU Stage 1 Dressing:  (Bilateral heel: Foam every 3 days.)                     Personal Care Assistance Level of Assistance              Functional Limitations Info  Sight, Hearing, Speech Sight Info: Adequate Hearing Info: Adequate Speech Info: Adequate    SPECIAL CARE FACTORS FREQUENCY  PT (By licensed PT), Blood pressure     PT Frequency: 5 x week              Contractures Contractures Info: Not present    Additional Factors Info  Code Status, Allergies, Isolation Precautions Code Status Info: Full Allergies Info: NKDA     Isolation Precautions Info: Enteric precautions     Current Medications (02/25/2017):  This is the current hospital active medication list Current Facility-Administered Medications  Medication Dose Route Frequency Provider Last Rate Last Dose  . 0.9 %  sodium chloride infusion   Intravenous Continuous Black,  Lesle Chris, NP      . acetaminophen (TYLENOL) tablet 650 mg  650 mg Oral Q6H PRN Gwenyth Bender, NP   650 mg at 02/24/17 2217   Or  . acetaminophen (TYLENOL) suppository 650 mg  650 mg Rectal Q6H PRN Gwenyth Bender, NP      . aspirin EC tablet 81 mg  81 mg Oral Daily Gwenyth Bender, NP   81 mg at 02/25/17 1006  . atorvastatin (LIPITOR) tablet 40 mg  40 mg Oral Daily Gwenyth Bender, NP   40 mg at 02/25/17 1006  . docusate sodium (COLACE) capsule 100 mg  100 mg Oral BID Black, Karen M, NP      . enoxaparin (LOVENOX) injection 40 mg  40 mg Subcutaneous Q24H Gwenyth Bender, NP   40 mg at 02/24/17 2145  . feeding supplement (ENSURE ENLIVE) (ENSURE ENLIVE) liquid 237 mL  237 mL Oral BID BM Tarry Kos A, MD   237 mL at 02/25/17 1012  . fludrocortisone (FLORINEF) tablet 0.2 mg  0.2 mg Oral Daily Gwenyth Bender, NP   0.2 mg at 02/25/17 1006  . fluticasone (FLONASE) 50 MCG/ACT nasal spray 1 spray  1 spray Each Nare Daily Gwenyth Bender, NP   1 spray at 02/25/17 1006  . hydrALAZINE (APRESOLINE) injection 2 mg  2 mg Intravenous Q6H PRN Gwenyth Bender, NP      . methocarbamol (ROBAXIN) tablet 500 mg  500 mg Oral Q8H PRN Gwenyth Bender, NP      . OLANZapine [facility]) tablet 2.5 mg  2.5 mg Oral QPM Gwenyth Bender, NP   2.5 mg at 02/24/17 2130  . ondansetron (ZOFRAN) tablet 4 mg  4 mg Oral Q6H PRN Gwenyth Bender, NP       Or  . ondansetron [facility]) injection 4 mg  4 mg Intravenous Q6H PRN Black, Karen M, NP      . polyethylene glycol (MIRALAX / GLYCOLAX) packet 17 g  17 g Oral Daily PRN Gwenyth Bender, NP      . sertraline (ZOLOFT) tablet 25 mg  25 mg Oral q morning - 10a Black, Lesle Chris, NP   25 mg at 02/25/17 1006  . sodium chloride flush (NS) 0.9 % injection 3 mL  3 mL Intravenous Q12H Black, Karen M, NP      . traMADol Janean Sark) tablet 50 mg  50 mg Oral Q6H PRN Gwenyth Bender, NP   50 mg at 02/25/17 1610     Discharge Medications: Please see discharge summary for a list of discharge  medications.  Relevant Imaging Results:  Relevant Lab Results:   Additional Information SS#: [id number]  Margarito Liner, LCSW

## 2017-02-25 NOTE — Clinical Social Work Note (Signed)
Clinical Social Work Assessment  Patient Details  Name: Tonya Payne MRN: [id number] Date of Birth: [birth year]  Date of referral:  02/25/17               Reason for consult:  Discharge Planning                Permission sought to share information with:  Chartered certified accountant granted to share information::  Yes, Verbal Permission Granted  Name::        Agency::  [facility]  Relationship::     Contact Information:     Housing/Transportation Living arrangements for the past 2 months:  [place] of Information:  Patient, Medical Team Patient Interpreter Needed:  None Criminal Activity/Legal Involvement Pertinent to Current Situation/Hospitalization:  No - Comment as needed Significant Relationships:  Adult Children Lives with:  Self Do you feel safe going back to the place where you live?  Yes Need for family participation in patient care:  Yes (Comment)  Care giving concerns:  Patient is a short-term rehab resident at [facility].   Social Worker assessment / plan:  CSW met with patient. No supports at bedside. CSW introduced role and explained that discharge planning would be discussed. Patient confirmed that she is a short-term rehab resident at [facility] and plans to return once stable for discharge. She has been there since August. PT evaluation pending. Patient reports that prior to going to [facility], she was home alone. Patient states she needs to start thinking about what she will do after SNF. CSW encouraged her to speak with the social worker at the facility as they can assist with that. No further concerns. CSW encouraged patient to contact CSW as needed. CSW will continue to follow patient for support and facilitate discharge back to SNF once medically stable.  Employment status:  Retired Forensic scientist:  Medicare PT Recommendations:  Not assessed at this time [place] / Referral to community resources:   [place]  Patient/Family's Response to care:  Patient agreeable to SNF placement. Patient's son supportive and involved in patient's care. Patient appreciated social work intervention.  Patient/Family's Understanding of and Emotional Response to Diagnosis, Current Treatment, and Prognosis:  Patient has a good understanding of the reason for admission and her need to return to [facility]. Patient appears happy with hospital care.  Emotional Assessment Appearance:  Appears stated age Attitude/Demeanor/Rapport:  Other (Pleasant) Affect (typically observed):  Accepting, Appropriate, Calm, Pleasant Orientation:  Oriented to Self, Oriented to Place, Oriented to  Time, Oriented to Situation Alcohol / Substance use:  Never Used Psych involvement (Current and /or in the community):  No (Comment)  Discharge Needs  Concerns to be addressed:  Care Coordination Readmission within the last 30 days:  Yes Current discharge risk:  Dependent with Mobility, Lives alone Barriers to Discharge:  Continued Medical Work up   Candie Chroman, LCSW 02/25/2017, 11:57 AM

## 2017-02-25 NOTE — Progress Notes (Signed)
PROGRESS NOTE  Tonya Payne YQM:[id number] DOB: 03/30/1935 DOA: 02/24/2017 PCP: Rocky Morel, MD  HPI/Recap of past 24 hours: This morning complaints of mild nausea. Reports feeling faint prior to syncopal event. Orthosthatic vital signs positive. Getting IV fluid hydration. Will obtain U/A to r/o UTI. Denies abdominal pain or diarrhea.   General:  No acute distress, alert and oriented x3  Eyes:  PERRL, EOMI, normal lids, sclerae anicteric.  ENT:  grossly normal hearing, lips & tongue, mucous membranes of her mouth are moist.  Neck:  no JVD or thyromegaly  Cardiovascular:  RRR, no m/r/g. No LE edema. 1/4 dorsalis pedis pulses bilaterally  Respiratory:  CTA bilaterally, no w/r/r. Normal respiratory effort.  Abdomen:  soft, ntnd, positive bowel sounds throughout no guarding or rebounding  Skin:  no rash or induration seen on limited exam  Musculoskeletal:  Moves all limbs freely; 5/5 strength in all 4.  Psychiatric:  Mood is appropriate for condition and setting.  Neurologic:  CN 2-12 grossly intact, moves all extremities in coordinated fashion, sensation intact.   Objective: Vitals:   02/24/17 2020 02/25/17 0116 02/25/17 0616 02/25/17 1235  BP: 126/76 (!) 173/79 125/82 130/76  Pulse: 82 77 81 78  Resp: 20 18 18 18   Temp: 98.8 F (37.1 C) (!) 97.2 F (36.2 C) 97.8 F (36.6 C) 97.8 F (36.6 C)  TempSrc: Oral Oral Oral Oral  SpO2: 96% 100% 99% 99%  Weight: 48 kg (105 lb 13.1 oz)  48.3 kg (106 lb 7.7 oz)   Height: 5' (1.524 m)       Intake/Output Summary (Last 24 hours) at 02/25/17 1438 Last data filed at 02/25/17 5284  Gross per 24 hour  Intake              120 ml  Output              300 ml  Net             -180 ml   Filed Weights   02/24/17 2020 02/25/17 0616  Weight: 48 kg (105 lb 13.1 oz) 48.3 kg (106 lb 7.7 oz)     Data Reviewed: CBC:  Recent Labs Lab 02/24/17 1305 02/25/17 0528  WBC 10.7* 7.4  NEUTROABS 8.6*  --   HGB 12.0 10.8*  HCT  36.4 34.0*  MCV 92.9 92.4  PLT 315 322   Basic Metabolic Panel:  Recent Labs Lab 02/24/17 1305 02/25/17 0528  NA 137 135  K 3.9 3.3*  CL 99* 102  CO2 26 24  GLUCOSE 163* 87  BUN 9 7  CREATININE 1.23* 0.93  CALCIUM 9.5 9.1   GFR: Estimated Creatinine Clearance: 34.7 mL/min (by C-G formula based on SCr of 0.93 mg/dL). Liver Function Tests: No results for input(s): AST, ALT, ALKPHOS, BILITOT, PROT, ALBUMIN in the last 168 hours. No results for input(s): LIPASE, AMYLASE in the last 168 hours. No results for input(s): AMMONIA in the last 168 hours. Coagulation Profile: No results for input(s): INR, PROTIME in the last 168 hours. Cardiac Enzymes: No results for input(s): CKTOTAL, CKMB, CKMBINDEX, TROPONINI in the last 168 hours. BNP (last 3 results) No results for input(s): PROBNP in the last 8760 hours. HbA1C: No results for input(s): HGBA1C in the last 72 hours. CBG: No results for input(s): GLUCAP in the last 168 hours. Lipid Profile: No results for input(s): CHOL, HDL, LDLCALC, TRIG, CHOLHDL, LDLDIRECT in the last 72 hours. Thyroid Function Tests: No results for input(s): TSH, T4TOTAL, FREET4, T3FREE,  THYROIDAB in the last 72 hours. Anemia Panel: No results for input(s): VITAMINB12, FOLATE, FERRITIN, TIBC, IRON, RETICCTPCT in the last 72 hours. Urine analysis:    Component Value Date/Time   COLORURINE YELLOW 01/29/2017 2129   APPEARANCEUR HAZY (A) 01/29/2017 2129   LABSPEC 1.016 01/29/2017 2129   PHURINE 7.0 01/29/2017 2129   GLUCOSEU NEGATIVE 01/29/2017 2129   HGBUR NEGATIVE 01/29/2017 2129   BILIRUBINUR NEGATIVE 01/29/2017 2129   KETONESUR NEGATIVE 01/29/2017 2129   PROTEINUR 30 (A) 01/29/2017 2129   UROBILINOGEN 0.2 02/25/2015 1826   NITRITE NEGATIVE 01/29/2017 2129   LEUKOCYTESUR MODERATE (A) 01/29/2017 2129   Sepsis Labs: @LABRCNTIP (procalcitonin:4,lacticidven:4)  ) Recent Results (from the past 240 hour(s))  MRSA PCR Screening     Status: None    Collection Time: 02/24/17  8:31 PM  Result Value Ref Range Status   MRSA by PCR NEGATIVE NEGATIVE Final    Comment:        The GeneXpert MRSA Assay (FDA approved for NASAL specimens only), is one component of a comprehensive MRSA colonization surveillance program. It is not intended to diagnose MRSA infection nor to guide or monitor treatment for MRSA infections.       Studies: Dg Chest Port 1v Same Day  Result Date: 02/24/2017 CLINICAL DATA:  Syncope. EXAM: PORTABLE CHEST 1 VIEW COMPARISON:  01/29/2017 and chest CT 02/11/2017 FINDINGS: Emphysematous changes with chronic interstitial or reticular densities at the lung bases. Chronic densities along the periphery of the mid right lung. No new airspace disease or consolidation. Post CABG changes. Atherosclerotic calcifications at the aortic arch. Surgical plate in the lower cervical spine. Heart size is normal. IMPRESSION: Chronic lung changes with emphysema.  No acute chest findings. Electronically Signed   By: Richarda OverlieAdam  Henn M.D.   On: 02/24/2017 16:42    Scheduled Meds: . aspirin EC  81 mg Oral Daily  . atorvastatin  40 mg Oral Daily  . docusate sodium  100 mg Oral BID  . enoxaparin (LOVENOX) injection  40 mg Subcutaneous Q24H  . feeding supplement (ENSURE ENLIVE)  237 mL Oral BID BM  . fludrocortisone  0.2 mg Oral Daily  . fluticasone  1 spray Each Nare Daily  . OLANZapine  2.5 mg Oral QPM  . sertraline  25 mg Oral q morning - 10a  . sodium chloride flush  3 mL Intravenous Q12H    Continuous Infusions: . sodium chloride       LOS: 0 days    Assessment/Plan: Principal Problem:   Syncope and collapse Active Problems:   Hypertension   CKD (chronic kidney disease), stage III (HCC)   COPD (chronic obstructive pulmonary disease) (HCC)   CAD (coronary artery disease)   Orthostatic hypotension   Hyperglycemia   Diarrhea   Compression fx, lumbar spine (HCC)   Syncope, most likely 2/2 to orthosthatic hypotension vs  others -Recurrent.   -Continue gentle IV hydration NS at 75 cc/hr -Obtain U/A to r/o UTI  Orthostatic hypotension.   -known orthostasis -Continue IV fluids -continue fludrocortisone  Heart failure with preserved EF 60-65% -Echo in 2018 reveals an EF 60-65% with grade 1 diastolic dysfunction -Lopressor in the past.   -Not on any antihypertensives for now.  Diarrhea.   -Improving -Monitor intake and output  AKI on CKD2 -Creatinine 1.23 on admission now 0.93 with baseline cr 0.7 -Monitor urine output -Avoid nephrotoxic meds -Gentle IV fluids NS 75cc/hr -Repeat BMP in am  Nausea -IV zofran  Hyperglycemia, most likely steroid-induced -Serum glucose 163  on admission. -A1c 5.06 October 1015 -Obtain a hemoglobin A1c -Sliding scale insulin with hypoglycemia protocol  CAD -continue home meds, asa, statin  Compression fracture/pelvic fracture from previous falls.  - -continue physical therapy -Plan is to return to SNF for rehab  Chronic depression/Mood disorder -zoloft, zyprexa  Chronic constipation -colace  Moderate malnutrition -feeding supplement ensure  Muscle spasm -robaxin Code Status: Full  Family Communication: None  Disposition Plan: Will stay another midnight due to persistent nausea and to continue IV fluid hydration due to poor oral intake.   Consultants:  None  Procedures:  None  Antimicrobials:  Not indicated; awaiting U/A results  DVT prophylaxis:     Darlin Drop, MD [facility] Pager [phone]  If 7PM-7AM, please contact night-coverage [email or web address] Password [facility] 02/25/2017, 2:38 PM

## 2017-02-25 NOTE — Evaluation (Signed)
Physical Therapy Evaluation Patient Details Name: Tonya Payne MRN: [id number] DOB: Feb 19, 1935 Today's Date: 02/25/2017   History of Present Illness  Tonya Payne is a very pleasant 82 y.o. female with medical history significant for orthostatic dizziness/hypotension, A. fib, CAD, frequent falls,AAA presents to the emergency department from her rehab center with chief complaint of syncope.  Initial evaluation reveals orthostatic hypotension and some acute on chronic kidney disease.   Past Medical History:  Diagnosis Date  . AAA (abdominal aortic aneurysm) (HCC)   . AAA (abdominal aortic aneurysm) without rupture (HCC) 2016   CTA 10/23/2015 - saccular infrarenal aneurysm roughly 4.8 cm in diameter. Stable from 2016 -- Dr. Darrick Penna  . Anterior wall myocardial infarction (HCC) 10/08/2015   Presented with severe chest pain and dynamic anterior ST elevations/biphasic elevations area did not meet full criteria for STEMI, and troponin was not dramatically elevated. 95% LAD treated with PTCA followed by CABG  . Anxiety   . Atrial fibrillation, new onset (HCC) 10/2015   after CABG, in SR at d/c  . CAD (coronary artery disease), native coronary artery 10/08/2015   Severe coronary disease with calcified left main and LAD: 5% left main going into ostial LAD. Mid LAD 95% (treated with PTCA). Proximal RCA 50%, calcified. EF was 20-25% with apical akinesis and distal anterior hypokinesis. --> Referred for CABG  . CAP (community acquired pneumonia) 07/2013   Hattie Perch 07/09/2013  . Congestive dilated cardiomyopathy (HCC) 10/08/2015   Intra-Op TEE showed EF of 40 and 45%. She had an echo with a "normal LV function "documented, but there was no reading M.D.  . COPD (chronic obstructive pulmonary disease) (HCC)   . Depression   . Headache    "weekly" (12/31/2016)  . Hyperlipidemia LDL goal <70 10/08/2015  . IBS (irritable bowel syndrome)   . Migraine    "none in years" (12/31/2016)  . Oral thrush   . Post PTCA  10/08/2015   Presented with STEMI, severe mid LAD lesion treated with PTCA and then referred for CABG.  . Syncope   . TIA (transient ischemic attack)     Clinical Impression  Pt admitted with above diagnosis. Pt currently with functional limitations due to the deficits listed below (see PT Problem List). Pt limited by orthostatic hypotension.  Will follow acutely.  Pt will benefit from skilled PT to increase their independence and safety with mobility to allow discharge to the venue listed below.      Follow Up Recommendations SNF;Supervision/Assistance - 24 hour    Equipment Recommendations  None recommended by PT    Recommendations for Other Services       Precautions / Restrictions Precautions Precautions: Fall Precaution Comments: pt states she no longer wears TLSO Restrictions Weight Bearing Restrictions: No      Mobility  Bed Mobility Overal bed mobility: Needs Assistance Bed Mobility: Rolling;Sidelying to Sit;Sit to Sidelying Rolling: Min assist Sidelying to sit: Min assist     Sit to sidelying: Min assist General bed mobility comments: Needed assist for LES and some for trunk elevation, used bed rail  Transfers                 General transfer comment: Did not assess due to pt orthostatic  Ambulation/Gait             General Gait Details: Unable at this time  Stairs            Wheelchair Mobility    Modified Rankin (Stroke Patients Only)  Balance Overall balance assessment: Needs assistance;History of Falls Sitting-balance support: Bilateral upper extremity supported;Feet supported Sitting balance-[name]: Poor Sitting balance - Comments: Needed assist to get balance initially.                                     Pertinent Vitals/Pain Pain Assessment: No/denies pain  Orthostatic BPs  Supine 145/82, 84 bpm  Sitting 118/84, 88 bpm with dizziness  Sitting after 3 min 114/74, 96 bpm with dizziness and pt  asked to lie down  Standing Unable to take due to dizziness    Home Living Family/patient expects to be discharged to:: Skilled nursing facility               Home Equipment: Dan Humphreys - 2 wheels;Wheelchair - manual;Tub bench;Toilet riser;Grab bars - tub/shower;Walker - 4 wheels;Bedside commode Additional Comments: Lived alone in an apartment prior to SNF for rehab.  son works    Prior Function Level of Independence: Needs assistance   Gait / Transfers Assistance Needed: ambulating with therapy with use of RW  ADL's / Homemaking Assistance Needed: Pt reports that the staff is doing "everything" when it comes to bathing and dressing.         Hand Dominance   Dominant Hand: Right    Extremity/Trunk Assessment   Upper Extremity Assessment Upper Extremity Assessment: Defer to OT evaluation    Lower Extremity Assessment Lower Extremity Assessment: Generalized weakness    Cervical / Trunk Assessment Cervical / Trunk Assessment: Other exceptions Cervical / Trunk Exceptions: spinal compression fxs  Communication   Communication: No difficulties  Cognition Arousal/Alertness: Awake/alert Behavior During Therapy: Anxious Overall Cognitive Status: No family/caregiver present to determine baseline cognitive functioning                                        General Comments      Exercises General Exercises - Lower Extremity Long Arc Quad: AROM;Both;5 reps;Seated   Assessment/Plan    PT Assessment Patient needs continued PT services  PT Problem List Decreased strength;Decreased activity tolerance;Decreased balance;Decreased mobility;Decreased coordination;Decreased cognition;Decreased knowledge of use of DME;Decreased safety awareness;Decreased knowledge of precautions       PT Treatment Interventions DME instruction;Gait training;Stair training;Functional mobility training;Therapeutic activities;Therapeutic exercise;Balance training;Neuromuscular  re-education;Cognitive remediation;Patient/family education    PT Goals (Current goals can be found in the Care Plan section)  Acute Rehab PT Goals Patient Stated Goal: Go back to SNF PT Goal Formulation: With patient Time For Goal Achievement: 03/11/17 Potential to Achieve Goals: Fair    Frequency Min 2X/week   Barriers to discharge Decreased caregiver support      Co-evaluation               AM-PAC PT "6 Clicks" Daily Activity  Outcome Measure Difficulty turning over in bed (including adjusting bedclothes, sheets and blankets)?: A Lot Difficulty moving from lying on back to sitting on the side of the bed? : A Little Difficulty sitting down on and standing up from a chair with arms (e.g., wheelchair, bedside commode, etc,.)?: Unable Help needed moving to and from a bed to chair (including a wheelchair)?: Total Help needed walking in hospital room?: Total Help needed climbing 3-5 steps with a railing? : Total 6 Click Score: 9    End of Session Equipment Utilized During Treatment: Gait belt Activity  Tolerance: Patient limited by fatigue (limited by orthostatic hypotension) Patient left: in bed;with call bell/phone within reach;with bed alarm set Nurse Communication: Mobility status PT Visit Diagnosis: Other abnormalities of gait and mobility (R26.89);Dizziness and giddiness (R42)    Time: [id number] PT Time Calculation (min) (ACUTE ONLY): 13 min   Charges:   PT Evaluation $PT Eval Moderate Complexity: 1 Mod     PT G Codes:   PT G-Codes **NOT FOR INPATIENT CLASS** Functional Assessment Tool Used: AM-PAC 6 Clicks Basic Mobility Functional Limitation: Mobility: Walking and moving around Mobility: Walking and Moving Around Current Status ([id number]): At least 80 percent but less than 100 percent impaired, limited or restricted Mobility: Walking and Moving Around Goal Status [phone](G8979): At least 20 percent but less than 40 percent impaired, limited or restricted    [facility]  Amandajo Gonder,PT Acute Rehabilitation [phone] (pager)   Berline Lopesawn F Selenne Coggin 02/25/2017, 12:31 PM

## 2017-02-25 NOTE — Progress Notes (Signed)
PT Note Orthostatic BPs  Supine 145/82, 84 bpm  Sitting 118/84, 88 bpm with dizziness  Sitting after 3 min 114/74, 96 bpm with dizziness and pt asked to lie down  Standing Unable to take due to dizziness  Full note to follow.  Wanted to make MD aware of orthostatic VS.  Thanks.  [facility]wn Demarious Kapur,PT Acute Rehabilitation [phone] (pager)

## 2017-02-25 NOTE — Progress Notes (Signed)
Initial Nutrition Assessment  DOCUMENTATION CODES:   Not applicable  INTERVENTION:   -Ensure Enlive po BID, each supplement provides 350 kcal and 20 grams of protein  -Recommend liberalizing diet to Heart Healthy only.   NUTRITION DIAGNOSIS:   Inadequate oral intake related to poor appetite as evidenced by per patient/family report.  GOAL:   Patient will meet greater than or equal to 90% of their needs  MONITOR:   PO intake, Supplement acceptance, Labs, Weight trends  REASON FOR ASSESSMENT:   Malnutrition Screening Tool    ASSESSMENT:   82 yo female admitted with syncope, orthostatic hypotension, acute on CKD. Pt with hx of AAA, afib, CAD/CABG, COPD, CAP, CM, IBS, oral thrush  Pt reports she drank a little bit of breakfast this AM; drank 100% of Ensure. Pt reports appetite has not been greatly recently has she does not like the food at the rehab center. Pt reports she does not like the food here either. Pt reports she does drink Ensure daily.   Pt reports UBW around 104-105 pounds but unsure when she last weighed this. Current wt 106 pounds.   Nutrition-Focused physical exam completed. Findings are mild fat depletion in orbital region only, mild muscle depletion in temporal region only, and no edema.   Labs: reviewed Meds: NS at 75 ml/hr  Diet Order:  Heart Healthy/Carb Modified  Skin:  Wound (see comment) (stage I heel)  Last BM:  10/21  Height:   Ht Readings from Last 1 Encounters:  02/24/17 5' (1.524 m)    Weight:   Wt Readings from Last 1 Encounters:  02/25/17 106 lb 7.7 oz (48.3 kg)    Ideal Body Weight:     BMI:  Body mass index is 20.8 kg/m.  Estimated Nutritional Needs:   Kcal:  1270-1430 kcals  Protein:  64-72 g  Fluid:  >/= 1.3 L  EDUCATION NEEDS:   No education needs identified at this time  Romelle StarcherCate Catlynn Grondahl MS, RD, LDN [phone] Pager  [phone] Weekend/On-Call Pager

## 2017-02-26 ENCOUNTER — Inpatient Hospital Stay (HOSPITAL_COMMUNITY): Payer: Medicare Other

## 2017-02-26 DIAGNOSIS — R55 Syncope and collapse: Secondary | ICD-10-CM

## 2017-02-26 LAB — BASIC METABOLIC PANEL
Anion gap: 6 (ref 5–15)
BUN: 7 mg/dL (ref 6–20)
CALCIUM: 8.9 mg/dL (ref 8.9–10.3)
CO2: 27 mmol/L (ref 22–32)
Chloride: 106 mmol/L (ref 101–111)
Creatinine, Ser: 0.92 mg/dL (ref 0.44–1.00)
GFR calc Af Amer: >60 mL/min (ref >60)
GFR, EST NON AFRICAN AMERICAN: 57 mL/min — AB (ref >60)
GLUCOSE: 95 mg/dL (ref 65–99)
POTASSIUM: 3.3 mmol/L — AB (ref 3.5–5.1)
SODIUM: 139 mmol/L (ref 135–145)

## 2017-02-26 LAB — CBC
HCT: 32.7 % — ABNORMAL LOW (ref 36.0–46.0)
Hemoglobin: 10.6 g/dL — ABNORMAL LOW (ref 12.0–15.0)
MCH: 30.1 pg (ref 26.0–34.0)
MCHC: 32.4 g/dL (ref 30.0–36.0)
MCV: 92.9 fL (ref 78.0–100.0)
PLATELETS: 320 10*3/uL (ref 150–400)
RBC: 3.52 MIL/uL — AB (ref 3.87–5.11)
RDW: 13.5 % (ref 11.5–15.5)
WBC: 7.9 10*3/uL (ref 4.0–10.5)

## 2017-02-26 MED ORDER — DEXTROSE 5 % IV SOLN
1.0000 g | INTRAVENOUS | Status: DC
  Administered 2017-02-26: 1 g via INTRAVENOUS
  Filled 2017-02-26: qty 10

## 2017-02-26 MED ORDER — SENNA 8.6 MG PO TABS: 1.0000 | ORAL_TABLET | Freq: Every day | ORAL | 0 refills | Status: AC

## 2017-02-26 MED ORDER — SENNA 8.6 MG PO TABS
1.0000 | ORAL_TABLET | Freq: Every day | ORAL | Status: DC
  Administered 2017-02-26: 8.6 mg via ORAL
  Filled 2017-02-26: qty 1

## 2017-02-26 MED ORDER — LACTULOSE 10 GM/15ML PO SOLN
20.0000 g | Freq: Once | ORAL | Status: AC
Start: 2017-02-26 — End: 2017-02-26
  Administered 2017-02-26: 20 g via ORAL
  Filled 2017-02-26: qty 30

## 2017-02-26 MED ORDER — CEFUROXIME AXETIL 500 MG PO TABS: 500.0000 mg | ORAL_TABLET | Freq: Two times a day (BID) | ORAL | 0 refills | Status: AC

## 2017-02-26 NOTE — Progress Notes (Signed)
[facility] to give [facility] - was transferred 3 times.  No answer.

## 2017-02-26 NOTE — Plan of Care (Signed)
Problem: Bowel/Gastric: Goal: Will not experience complications related to bowel motility Outcome: Completed/Met Date Met: 02/26/17 Encourage patient to monitor BM.  Ambulation as tolerated and to drink water (amount per doctor order) to improve bowel motility.

## 2017-02-26 NOTE — Discharge Summary (Signed)
Physician Discharge Summary  Tonya AlmSuann Payne ZOX:[id number] DOB: 18-Mar-1935 DOA: 02/24/2017  PCP: Rocky Morelostand, Robert, MD  Admit date: 02/24/2017 Discharge date: 02/26/2017  Admitted From: SNF Disposition:  SNF  Recommendations for Outpatient Follow-up:  1. Follow up with PCP in 1-2 weeks 2. Please obtain BMP/CBC in one week 3. Please follow up on the following pending results: please follow urine culture.    Discharge Condition: stable.  CODE STATUS: full code.  Diet recommendation: Heart Healthy  Brief/Interim Summary: Tonya AlmSuann Bayley is a very pleasant 82 y.o. female with medical history significant for orthostatic dizziness/hypotension, A. fib, CAD, frequent falls,AAA presents to the emergency department from her rehab center with chief complaint of syncope.  Initial evaluation reveals orthostatic hypotension and some acute on chronic kidney disease.  [facility] hospitalist asked to admit for observation.  Information is obtained from the chart and the patient.  She was admitted 10 days ago for same.  She is known to have long-standing orthostatic hypotension.  Today she states she was in the wheelchair went to the gym for exercises.  She stood up to walk with her physical therapist" out and went".  She did not hit her head.  No injury reported.  Associated symptoms include recent diarrhea.  Reports 3 episodes over the last day or so.  Describes it as watery light brown.  Denies any dark stool or bloody stool.  He also reports chronic abdominal "tenderness".  She denies nausea vomiting or headache dysuria hematuria frequency urgency.  She denies any chest pain palpitation shortness of breath cough fever chills. She has a known AAA reports she has not got any vascular surgery appointments and is not interested in [facility].  She believes she has been not eating her normal amount as the "food at the center is bad".  Of note patient is in rehab facility due to fall and compression fracture of her  spine.    Syncope; probably related to orthostasis hypotension. Continue with florinef.   UTI; relates dysuria.  UA with too numerous to count WBC  Received one dose of ceftriaxone inpatient.  Discharge on ceftin for 3 days.  Please follow urine culture.   Diastolic FH chronic compensated.   Diarrhea; resolved. Now with constipation.   Constipation; denies passing gas this am. KUB negative for obstruction, dilation of Aneurysmal. Patient denies abdominal pain. She wouldn't want to have sx.   AKI on CKD2 -Creatinine 1.23 on admission now 0.93 with baseline cr 0.7 -Monitor urine output  Moderate malnutrition -feeding supplement ensure  Discharge Diagnoses:  Principal Problem:   Syncope and collapse Active Problems:   Hypertension   CKD (chronic kidney disease), stage III (HCC)   COPD (chronic obstructive pulmonary disease) (HCC)   CAD (coronary artery disease)   Orthostatic hypotension   Hyperglycemia   Diarrhea   Compression fx, lumbar spine (HCC)   Syncope due to autonomic failure    Discharge Instructions  Discharge Instructions    Diet - low sodium heart healthy    Complete by:  As directed    Increase activity slowly    Complete by:  As directed      Allergies as of 02/26/2017   No Known Allergies     Medication List    STOP taking these medications   metoprolol tartrate 25 MG tablet Commonly known as:  LOPRESSOR     TAKE these medications   aspirin EC 81 MG tablet Take 81 mg by mouth every evening.   atorvastatin 40 MG tablet Commonly  known as:  LIPITOR Take 1 tablet (40 mg total) by mouth daily at 6 PM.   cefUROXime 500 MG tablet Commonly known as:  CEFTIN Take 1 tablet (500 mg total) by mouth 2 (two) times daily.   docusate sodium 100 MG capsule Commonly known as:  COLACE Take 1 capsule (100 mg total) by mouth 2 (two) times daily.   fexofenadine 180 MG tablet Commonly known as:  ALLEGRA Take 180 mg by mouth every evening.    fludrocortisone 0.1 MG tablet Commonly known as:  FLORINEF Take 2 tablets (0.2 mg total) by mouth daily.   fluticasone 50 MCG/ACT nasal spray Commonly known as:  FLONASE Place 1 spray into both nostrils daily. What changed:  when to take this   megestrol 40 MG/ML suspension Commonly known as:  MEGACE Take 800 mg by mouth daily as needed (appetite).   methocarbamol 500 MG tablet Commonly known as:  ROBAXIN Take 1 tablet (500 mg total) by mouth every 8 (eight) hours as needed for muscle spasms.   multivitamin with minerals Tabs tablet Take 1 tablet by mouth every evening.   OLANZapine 2.5 MG tablet Commonly known as:  ZYPREXA Take 1 tablet (2.5 mg total) by mouth every evening. What changed:  when to take this   polyethylene glycol packet Commonly known as:  MIRALAX / GLYCOLAX Take 17 g by mouth daily as needed. What changed:  reasons to take this  additional instructions   senna 8.6 MG Tabs tablet Commonly known as:  SENOKOT Take 1 tablet (8.6 mg total) by mouth daily.   sertraline 50 MG tablet Commonly known as:  ZOLOFT Take 0.5 tablets (25 mg total) by mouth every morning. What changed:  when to take this   traMADol 50 MG tablet Commonly known as:  ULTRAM Take 1 tablet (50 mg total) by mouth every 6 (six) hours as needed for moderate pain.      Contact information for after-discharge care    Destination    [facility] .   Specialty:  Skilled Nursing Facility Contact information: [address] [place] [address] [phone]             No Known Allergies  Consultations:  none   Procedures/Studies: Ct Head Wo Contrast  Result Date: 02/11/2017 CLINICAL DATA:  Acute onset of dizziness, with fall to the right side. Right-sided head and neck pain. Initial encounter. EXAM: CT HEAD WITHOUT CONTRAST CT CERVICAL SPINE WITHOUT CONTRAST TECHNIQUE: Multidetector CT imaging of the head and cervical spine was performed  following the standard protocol without intravenous contrast. Multiplanar CT image reconstructions of the cervical spine were also generated. COMPARISON:  CT of the head and cervical spine performed 12/29/2016 FINDINGS: CT HEAD FINDINGS Brain: No evidence of acute infarction, hemorrhage, hydrocephalus, extra-axial collection or mass lesion/mass effect. Prominence of the ventricles and sulci reflects moderate cortical volume loss. Mild cerebellar atrophy is noted. Scattered periventricular and subcortical white matter change likely reflects small vessel ischemic microangiopathy. Chronic lacunar infarcts are noted at the basal ganglia bilaterally. A chronic infarct is noted at the right occipital lobe, with associated encephalomalacia. The brainstem and fourth ventricle are within normal limits. No mass effect or midline shift is seen. Vascular: No hyperdense vessel or unexpected calcification. Skull: There is no evidence of fracture; visualized osseous structures are unremarkable in appearance. Sinuses/Orbits: The visualized portions of the orbits are within normal limits. The paranasal sinuses and mastoid air cells are well-aerated. Other: No significant soft tissue abnormalities  are seen. CT CERVICAL SPINE FINDINGS Alignment: Normal. Skull base and vertebrae: No acute fracture. No primary bone lesion or focal pathologic process. Soft tissues and spinal canal: No prevertebral fluid or swelling. No visible canal hematoma. Disc levels: The patient is status post anterior cervical spinal fusion at C5-C7, with mild underlying facet disease. Mild degenerative change is noted about the dens. Upper chest: Scattered calcification is noted along the carotid bifurcations bilaterally, more prominent on the left. The visualized portions of the thyroid gland are unremarkable. Mild scarring is noted at the lung apices. Other: No additional soft tissue abnormalities are seen. IMPRESSION: 1. No evidence of traumatic intracranial  injury or fracture. 2. No evidence of fracture or subluxation along the cervical spine. 3. Moderate cortical volume loss and scattered small vessel ischemic microangiopathy. 4. Chronic lacunar infarcts at the basal ganglia bilaterally. Chronic infarct at the right occipital lobe, with associated encephalomalacia. 5. Status post anterior cervical spinal fusion at C5-C7, with minimal underlying degenerative change. 6. Scattered calcification along the carotid bifurcations, more prominent on the left. Carotid ultrasound would be helpful for further evaluation, when and as deemed clinically appropriate. Electronically Signed   By: Roanna Raider M.D.   On: 02/11/2017 22:35   Ct Cervical Spine Wo Contrast  Result Date: 02/11/2017 CLINICAL DATA:  Acute onset of dizziness, with fall to the right side. Right-sided head and neck pain. Initial encounter. EXAM: CT HEAD WITHOUT CONTRAST CT CERVICAL SPINE WITHOUT CONTRAST TECHNIQUE: Multidetector CT imaging of the head and cervical spine was performed following the standard protocol without intravenous contrast. Multiplanar CT image reconstructions of the cervical spine were also generated. COMPARISON:  CT of the head and cervical spine performed 12/29/2016 FINDINGS: CT HEAD FINDINGS Brain: No evidence of acute infarction, hemorrhage, hydrocephalus, extra-axial collection or mass lesion/mass effect. Prominence of the ventricles and sulci reflects moderate cortical volume loss. Mild cerebellar atrophy is noted. Scattered periventricular and subcortical white matter change likely reflects small vessel ischemic microangiopathy. Chronic lacunar infarcts are noted at the basal ganglia bilaterally. A chronic infarct is noted at the right occipital lobe, with associated encephalomalacia. The brainstem and fourth ventricle are within normal limits. No mass effect or midline shift is seen. Vascular: No hyperdense vessel or unexpected calcification. Skull: There is no evidence of  fracture; visualized osseous structures are unremarkable in appearance. Sinuses/Orbits: The visualized portions of the orbits are within normal limits. The paranasal sinuses and mastoid air cells are well-aerated. Other: No significant soft tissue abnormalities are seen. CT CERVICAL SPINE FINDINGS Alignment: Normal. Skull base and vertebrae: No acute fracture. No primary bone lesion or focal pathologic process. Soft tissues and spinal canal: No prevertebral fluid or swelling. No visible canal hematoma. Disc levels: The patient is status post anterior cervical spinal fusion at C5-C7, with mild underlying facet disease. Mild degenerative change is noted about the dens. Upper chest: Scattered calcification is noted along the carotid bifurcations bilaterally, more prominent on the left. The visualized portions of the thyroid gland are unremarkable. Mild scarring is noted at the lung apices. Other: No additional soft tissue abnormalities are seen. IMPRESSION: 1. No evidence of traumatic intracranial injury or fracture. 2. No evidence of fracture or subluxation along the cervical spine. 3. Moderate cortical volume loss and scattered small vessel ischemic microangiopathy. 4. Chronic lacunar infarcts at the basal ganglia bilaterally. Chronic infarct at the right occipital lobe, with associated encephalomalacia. 5. Status post anterior cervical spinal fusion at C5-C7, with minimal underlying degenerative change. 6. Scattered calcification along  the carotid bifurcations, more prominent on the left. Carotid ultrasound would be helpful for further evaluation, when and as deemed clinically appropriate. Electronically Signed   By: Roanna Raider M.D.   On: 02/11/2017 22:35   US Renal  Result Date: 01/31/2017 CLINICAL DATA:  82 year old female with bilateral flank pain for several months. EXAM: RENAL / URINARY TRACT ULTRASOUND COMPLETE COMPARISON:  01/29/2017 CT FINDINGS: Right Kidney: Length: 9.8 cm. Upper limits of normal  renal echogenicity with mild cortical thinning. No evidence of hydronephrosis or solid mass. Left Kidney: Length: 8.6 cm. Upper limits of normal renal echogenicity with moderate cortical thinning. No evidence of hydronephrosis or solid mass. Bladder: Foley catheter within the bladder noted.  No definite abnormalities. IMPRESSION: 1. No evidence of acute abnormality.  No evidence of hydronephrosis. 2. Upper limits of normal renal echogenicity with renal cortical thinning, mild on the right and moderate on the left. Electronically Signed   By: Harmon Pier M.D.   On: 01/31/2017 08:13   Ct L-spine No Charge  Result Date: 01/29/2017 CLINICAL DATA:  82 year old female with back pain and syncope. Known abdominal aortic aneurysm. T9 compression fracture in August after a fall. EXAM: CT LUMBAR SPINE WITHOUT CONTRAST TECHNIQUE: Multidetector CT imaging of the lumbar spine was performed without intravenous contrast administration. Multiplanar CT image reconstructions were also generated. COMPARISON:  CTA Abdomen and Pelvis today reported separately. FINDINGS: Segmentation: Normal. The same numbering system was used on 12/29/2016 CT chest abdomen and pelvis. Alignment: Stable and largely normal lumbar vertebral height and alignment. Trace anterolisthesis of L5 on S1 is stable. Vertebrae: Osteopenia. No acute osseous abnormality identified. Visible sacrum and SI joints appear intact. Paraspinal and other soft tissues: Partially visible large 6 cm diameter infrarenal abdominal aortic aneurysm. See CT Abdomen and Pelvis today reported separately. No acute findings in the visualized posterior paraspinal soft tissues. Disc levels: T12-L1:  Negative. L1-L2:  Negative. L2-L3: Mild disc bulging with bilateral broad-based foraminal component. Mild epidural lipomatosis and facet hypertrophy. Mild if any spinal stenosis. No convincing foraminal stenosis. L3-L4: Mild circumferential disc bulge. Mild facet hypertrophy. No stenosis.  L4-L5: Circumferential disc bulge eccentric to the right with broad-based posterior component. Prior laminectomy. Mild to moderate residual facet hypertrophy. Mild to moderate left and mild right L4 neural foraminal stenosis. L5-S1: Trace anterolisthesis. Circumferential disc bulge with broad-based posterior component. Prior laminectomy with severe residual facet hypertrophy. Right side vacuum facet. No spinal stenosis. Borderline to mild bilateral lateral recess and foraminal stenosis. IMPRESSION: 1. Partially visible large 6 cm infrarenal abdominal aortic aneurysm with prominent growth over the past 12 months suggesting an increased risk of aneurysm rupture. Recommend Vascular Surgery referral/consultation if not already obtained. 2. See also CTA Abdomen and Pelvis today reported separately. 3.  No acute osseous abnormality in the lumbar spine. 4. Mild if any degenerative lumbar spinal stenosis. Prior laminectomies at L4-L5 and L5-S1 with moderate to severe residual facet hypertrophy resulting in primarily mild bilateral foraminal stenosis. Electronically Signed   By: Odessa Fleming M.D.   On: 01/29/2017 16:59   Dg Chest Port 1 View  Result Date: 01/29/2017 CLINICAL DATA:  Chest pain and acute onset shortness of breath for 1 week. EXAM: PORTABLE CHEST 1 VIEW COMPARISON:  CT chest December 29, 2016 and chest radiograph November 12, 2016 FINDINGS: Cardiac silhouette is mildly enlarged and unchanged. Status post median sternotomy for CABG. Calcified aortic knob. Mild chronic interstitial changes increased lung volumes without pleural effusion. Bibasilar strandy densities. No pleural effusion or focal  consolidation. Biapical pleural thickening. No pneumothorax. Soft tissue planes and included osseous structures are nonacute. Osteopenia. IMPRESSION: Stable cardiomegaly.  COPD and bibasilar fibrosis. Electronically Signed   By: Awilda Metro M.D.   On: 01/29/2017 21:47   Dg Chest Port 1v Same Day  Result Date:  02/24/2017 CLINICAL DATA:  Syncope. EXAM: PORTABLE CHEST 1 VIEW COMPARISON:  01/29/2017 and chest CT 02/11/2017 FINDINGS: Emphysematous changes with chronic interstitial or reticular densities at the lung bases. Chronic densities along the periphery of the mid right lung. No new airspace disease or consolidation. Post CABG changes. Atherosclerotic calcifications at the aortic arch. Surgical plate in the lower cervical spine. Heart size is normal. IMPRESSION: Chronic lung changes with emphysema.  No acute chest findings. Electronically Signed   By: Richarda Overlie M.D.   On: 02/24/2017 16:42   Dg Abd Portable 1v  Result Date: 02/26/2017 CLINICAL DATA:  Constipation. EXAM: PORTABLE ABDOMEN - 1 VIEW COMPARISON:  Abdominopelvic CT of 01/29/2017 FINDINGS: Single supine view the abdomen and pelvis patient is rotated mildly. Cardiomegaly. Median sternotomy. No gaseous distention of bowel loops. No free intraperitoneal air. Low pelvis excluded. Calcification corresponding to abdominal aortic aneurysm. This measures on the order of 6.8 cm versus 5.6 cm on the scout exam of 09/26. 6.0 cm on the scout exam of 02/11/2017. IMPRESSION: No bowel obstruction or other acute process. Abdominal aortic aneurysm. Apparent increased size compared to scout films of CTs including 02/11/2017. This could potentially be secondary to obliquity on today's plain film. If there is clinical concern of acute process such as impending rupture, consider repeat dedicated CTA. Electronically Signed   By: Jeronimo Greaves M.D.   On: 02/26/2017 11:08   Ct Angio Chest/abd/pel For Dissection W And/or Wo Contrast  Result Date: 02/11/2017 CLINICAL DATA:  Acute onset of dizziness. Status post fall. Follow-up known thoracoabdominal aortic aneurysm. Initial encounter. EXAM: CT ANGIOGRAPHY CHEST, ABDOMEN AND PELVIS TECHNIQUE: Multidetector CT imaging through the chest, abdomen and pelvis was performed using the standard protocol during bolus administration of  intravenous contrast. Multiplanar reconstructed images and MIPs were obtained and reviewed to evaluate the vascular anatomy. CONTRAST:  90 mL of Isovue 370 IV contrast COMPARISON:  CT of the chest performed 12/29/2016, and CTA of the abdomen and pelvis performed 01/29/2017 FINDINGS: CTA CHEST FINDINGS Cardiovascular: There is no evidence of thoracic aortic aneurysm. There is no evidence of aortic dissection. Scattered calcification is noted along the aortic arch and proximal great vessels, and along the descending thoracic aorta, with minimal associated mural thrombus but no significant luminal narrowing. The heart remains normal in size. The patient is status post median sternotomy, with evidence of prior CABG. Mediastinum/Nodes: No mediastinal lymphadenopathy is seen. No pericardial effusion is identified. The visualized portions of the thyroid gland are unremarkable. No axillary lymphadenopathy is appreciated. Lungs/Pleura: Scarring is noted at the lung apices. Bilateral emphysema is noted, with underlying scarring and peripheral nodularity. No pleural effusion or pneumothorax is seen. No dominant mass is identified. Musculoskeletal: No acute osseous abnormalities are identified. A compression fracture of vertebral body T9 appears slightly worsened from the prior study. The visualized musculature is unremarkable in appearance. Review of the MIP images confirms the above findings. CTA ABDOMEN AND PELVIS FINDINGS VASCULAR Aorta: There is aneurysmal dilatation of the infrarenal abdominal aorta to 5.6 cm in AP dimension and 5.8 cm in transverse dimension, relatively stable from the recent prior CTA. Underlying thrombosis of the aneurysm sac is relatively stable in appearance. The aneurysm resolves just proximal  to the aortic bifurcation. No soft tissue inflammation is seen. Scattered calcification is noted along the abdominal aorta. Celiac: The celiac trunk appears grossly intact. SMA: Mild calcification is noted  along the superior mesenteric artery, though it remains grossly intact. Renals: There is calcification along the proximal renal arteries bilaterally, with luminal narrowing along the proximal right renal artery. Would correlate for evidence of refractory hypertension. IMA: The inferior mesenteric artery is not visualized. Inflow: Scattered calcification is noted along the common, internal and external iliac arteries bilaterally. Scattered calcification is seen at the common femoral arteries bilaterally. Veins: Visualized venous structures are grossly unremarkable in appearance. Review of the MIP images confirms the above findings. NON-VASCULAR Hepatobiliary: The liver is unremarkable in appearance. The gallbladder is unremarkable in appearance. The common bile duct remains normal in caliber. Pancreas: The pancreas is within normal limits. Spleen: The spleen is unremarkable in appearance. Adrenals/Urinary Tract: The adrenal glands are unremarkable in appearance. There is moderate left renal atrophy, and mild bilateral renal scarring. There is chronic right-sided renal pelvicaliectasis, without significant hydronephrosis. No distal obstructing stone is identified. No significant perinephric stranding is appreciated. Stomach/Bowel: No significant small bowel abnormalities are seen. The stomach is grossly unremarkable in appearance. The appendix is not definitely seen; there is no evidence for appendicitis. Scattered diverticulosis is noted along the sigmoid colon. Postoperative change is noted at the distal sigmoid colon. There is no evidence of diverticulitis at this time. Lymphatic: No retroperitoneal or pelvic sidewall lymphadenopathy is seen. Reproductive: The bladder is mildly distended and grossly unremarkable in appearance. The patient is status post hysterectomy. No suspicious adnexal masses are seen. Other: No additional soft tissue abnormalities are seen. Musculoskeletal: No acute osseous abnormalities are  identified. Pins are noted at the left femoral neck. The visualized musculature is unremarkable in appearance. Review of the MIP images confirms the above findings. IMPRESSION: 1. Infrarenal abdominal aortic aneurysm measures approximately 5.6 cm in AP dimension and 5.8 cm in transverse dimension, relatively stable from the recent prior CTA. Underlying thrombosis of the aneurysm sac is also stable in appearance. The aneurysm resolves just proximal to the aortic bifurcation. 2. No evidence of thoracic aortic aneurysm. No evidence of aortic dissection. 3. Scattered aortic atherosclerosis. 4. Bilateral emphysema, with underlying scarring and peripheral nodularity. No dominant mass seen. Scarring at the lung apices. 5. Slightly worsened compression fracture vertebral body T9, in comparison to the prior CT of the chest. 6. Moderate left renal atrophy, and mild bilateral renal scarring. 7. Scattered diverticulosis along the sigmoid colon, without evidence of diverticulitis. Electronically Signed   By: Roanna Raider M.D.   On: 02/11/2017 22:51   Ct Angio Abd/pel W And/or Wo Contrast  Result Date: 01/29/2017 CLINICAL DATA:  Abdominal aortic aneurysm fall.  Syncopal episodes. EXAM: CTA ABDOMEN AND PELVIS WITHOUT AND WITH CONTRAST TECHNIQUE: Multidetector CT imaging of the abdomen and pelvis was performed using the standard protocol during bolus administration of intravenous contrast. Multiplanar reconstructed images and MIPs were obtained and reviewed to evaluate the vascular anatomy. CONTRAST:  80 cc Isovue 370 IV COMPARISON:  12/29/2016 CT FINDINGS: VASCULAR Aorta: Infrarenal abdominal aortic aneurysm is again noted, stable in appearance, estimated at 5.9 x 5.4 cm without periaortic stranding to suggest rupture. Measurements are similar to most recent prior exam though reported as being 6 x 5.9 cm but should have been 6 x 5.49 cm. Extensive atherosclerotic atherosclerosis with mural thrombus is again seen. No acute  vascular injury, retroperitoneal fluid or hemorrhage. Celiac: Patent with moderate  calcific atherosclerosis at its origin with a 50% stenosis suggested. No evidence of dissection, vasculitis or significant stenosis. SMA: Patent with mild-to-moderate atherosclerosis at the origin with less than 50% stenosis suggested. No evidence of aneurysm, dissection, vasculitis or significant stenosis. Renals: Single renal arteries to both kidneys with atherosclerosis along the proximal right renal artery and origin of the left with at least 50% stenosis suggested on the right and also likely in the left. No dissection, or evidence of fibromuscular dysplasia. No aneurysm. Perfusion is symmetric both kidneys however. IMA: Not definitively identified and is likely occluded. Inflow: Moderate atherosclerosis of the common iliac arteries without aneurysm, dissection or significant stenosis. Thin attenuated but patent internal iliac arteries with small external iliac arteries. Proximal Outflow: Patent without evidence of aneurysm, dissection, vasculitis or significant stenosis. Veins: No obvious venous abnormality within the limitations of this arterial phase study. Review of the MIP images confirms the above findings. NON-VASCULAR Lower chest: Extensive bilateral centrilobular and paraseptal emphysema with subpleural areas of fibrosis. Stable right lower lobe 8 mm opacity may reflect an area of chronic scarring, unchanged since recent comparison from 12/29/2016 although new since 10/23/2015. Hepatobiliary: No focal liver abnormality is seen. No gallstones, gallbladder wall thickening, or biliary dilatation. Pancreas: Unremarkable. No pancreatic ductal dilatation or surrounding inflammatory changes. Spleen: Normal in size without focal abnormality. Adrenals/Urinary Tract: Bilateral renal cortical thinning more pronounced on the left. No obstructive uropathy or nephrolithiasis. Tiny exophytic hyperdense lesion in the upper pole left  kidney may represent a hemorrhagic or proteinaceous cyst. This measures 4 mm and is too small to further characterize. Stomach/Bowel: Contracted stomach with normal small bowel rotation. No acute bowel inflammation or obstruction. Evidence of prior partial colectomy with anastomotic sutures along the expected location of the rectosigmoid. Small bowel anastomotic sutures are also seen in the mid pelvis. Appendix is not definitively identified but there is no evidence of right lower quadrant or pericecal inflammation. Lymphatic: No lymphadenopathy. Reproductive: Status post hysterectomy. No adnexal masses. Other: No abdominal wall hernia or abnormality. No abdominopelvic ascites. Musculoskeletal: No acute or significant osseous findings. IMPRESSION: VASCULAR 1. Re- demonstration of a stable infrarenal fusiform abdominal aortic aneurysm unchanged since CT from 12/29/2016. It is currently estimated at 5.9 x 5.4 cm. No evidence rupture or leak. 2. 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 NON-VASCULAR Centrilobular and paraseptal emphysema of the lung bases with interstitial fibrosis. Probable scarring at the right lung base accounting for an 8 mm opacity seen. Electronically Signed   By: Tollie Eth M.D.   On: 01/29/2017 19:02     Subjective: She is feeling better, no nausea. Has been passing gas.   Discharge Exam: Vitals:   02/26/17 0500 02/26/17 1153  BP: (!) 153/93 117/80  Pulse: 64 81  Resp: 18   Temp: (!) 97.5 F (36.4 C)   SpO2: 94% 98%   Vitals:   02/25/17 1235 02/25/17 1942 02/26/17 0500 02/26/17 1153  BP: 130/76 136/63 (!) 153/93 117/80  Pulse: 78 70 64 81  Resp: 18 18 18    Temp: 97.8 F (36.6 C) 97.9 F (36.6 C) (!) 97.5 F (36.4 C)   TempSrc: Oral Oral Oral   SpO2: 99% 96% 94% 98%  Weight:   47.2 kg (104 lb 0.9 oz)   Height:         General: Pt is alert, awake, not in acute  distress Cardiovascular: RRR, S1/S2 +, no rubs, no gallops Respiratory: CTA bilaterally, no wheezing, no rhonchi Abdominal: Soft, NT, ND, bowel sounds + Extremities: no edema, no cyanosis    The results of significant diagnostics from this hospitalization (including imaging, microbiology, ancillary and laboratory) are listed below for reference.     Microbiology: Recent Results (from the past 240 hour(s))  MRSA PCR Screening     Status: None   Collection Time: 02/24/17  8:31 PM  Result Value Ref Range Status   MRSA by PCR NEGATIVE NEGATIVE Final    Comment:        The GeneXpert MRSA Assay (FDA approved for NASAL specimens only), is one component of a comprehensive MRSA colonization surveillance program. It is not intended to diagnose MRSA infection nor to guide or monitor treatment for MRSA infections.      Labs: BNP (last 3 results) No results for input(s): BNP in the last 8760 hours. Basic Metabolic Panel:  Recent Labs Lab 02/24/17 1305 02/25/17 0528 02/26/17 0520  NA 137 135 139  K 3.9 3.3* 3.3*  CL 99* 102 106  CO2 26 24 27   GLUCOSE 163* 87 95  BUN 9 7 7   CREATININE 1.23* 0.93 0.92  CALCIUM 9.5 9.1 8.9   Liver Function Tests: No results for input(s): AST, ALT, ALKPHOS, BILITOT, PROT, ALBUMIN in the last 168 hours. No results for input(s): LIPASE, AMYLASE in the last 168 hours. No results for input(s): AMMONIA in the last 168 hours. CBC:  Recent Labs Lab 02/24/17 1305 02/25/17 0528 02/26/17 0520  WBC 10.7* 7.4 7.9  NEUTROABS 8.6*  --   --   HGB 12.0 10.8* 10.6*  HCT 36.4 34.0* 32.7*  MCV 92.9 92.4 92.9  PLT 315 322 320   Cardiac Enzymes: No results for input(s): CKTOTAL, CKMB, CKMBINDEX, TROPONINI in the last 168 hours. BNP: Invalid input(s): POCBNP CBG: No results for input(s): GLUCAP in the last 168 hours. D-Dimer No results for input(s): DDIMER in the last 72 hours. Hgb A1c No results for  input(s): HGBA1C in the last 72 hours. Lipid Profile No results for input(s): CHOL, HDL, LDLCALC, TRIG, CHOLHDL, LDLDIRECT in the last 72 hours. Thyroid function studies No results for input(s): TSH, T4TOTAL, T3FREE, THYROIDAB in the last 72 hours.  Invalid input(s): FREET3 Anemia work up No results for input(s): VITAMINB12, FOLATE, FERRITIN, TIBC, IRON, RETICCTPCT in the last 72 hours. Urinalysis    Component Value Date/Time   COLORURINE YELLOW 02/25/2017 1626   APPEARANCEUR CLOUDY (A) 02/25/2017 1626   LABSPEC 1.011 02/25/2017 1626   PHURINE 6.0 02/25/2017 1626   GLUCOSEU NEGATIVE 02/25/2017 1626   HGBUR NEGATIVE 02/25/2017 1626   BILIRUBINUR NEGATIVE 02/25/2017 1626   KETONESUR NEGATIVE 02/25/2017 1626   PROTEINUR 30 (A) 02/25/2017 1626   UROBILINOGEN 0.2 02/25/2015 1826   NITRITE NEGATIVE 02/25/2017 1626   LEUKOCYTESUR LARGE (A) 02/25/2017 1626   Sepsis Labs Invalid input(s): PROCALCITONIN,  WBC,  LACTICIDVEN Microbiology Recent Results (from the past 240 hour(s))  MRSA PCR Screening     Status: None   Collection Time: 02/24/17  8:31 PM  Result Value Ref Range Status   MRSA by PCR NEGATIVE NEGATIVE Final    Comment:        The GeneXpert MRSA Assay (FDA approved for NASAL specimens only), is one component of a comprehensive MRSA colonization surveillance program. It is not intended to diagnose MRSA infection nor to guide or monitor treatment for MRSA infections.      Time coordinating  discharge: Over 30 minutes  SIGNED:   Alba Cory, MD  [facility] 02/26/2017, 3:28 PM Pager   If 7PM-7AM, please contact night-coverage [email or web address] Password [email or web address]

## 2017-02-26 NOTE — Clinical Social Work Note (Signed)
CSW facilitated patient discharge including contacting patient family (patient was on the phone with her son when CSW told her that transport was being arranged) and facility to confirm patient discharge plans. Clinical information faxed to facility and family agreeable with plan. CSW arranged ambulance transport via [facility] to [facility]. RN to call report prior to discharge [phone]).  CSW will sign off for now as social work intervention is no longer needed. Please consult us again if new needs arise.  Charlynn CourtSarah Devinn Voshell, CSW [phone]

## 2017-02-26 NOTE — Progress Notes (Signed)
Discontinue Enteric Precaution per protocol-  pt has not had a BM since Sunday morning.

## 2017-02-26 NOTE — Plan of Care (Signed)
Problem: Safety: Goal: Ability to remain free from injury will improve Outcome: Completed/Met Date Met: 02/26/17 Encourage patient call before getting up.  And to make transition slowly (lying to siting and sitting to standing

## 2017-02-27 ENCOUNTER — Ambulatory Visit: Payer: Medicare Other | Admitting: Vascular Surgery

## 2017-03-20 ENCOUNTER — Encounter (HOSPITAL_COMMUNITY): Payer: Self-pay | Admitting: Emergency Medicine

## 2017-03-20 ENCOUNTER — Other Ambulatory Visit: Payer: Self-pay

## 2017-03-20 ENCOUNTER — Ambulatory Visit: Payer: Medicare Other | Admitting: Vascular Surgery

## 2017-03-20 ENCOUNTER — Emergency Department (HOSPITAL_COMMUNITY)
Admission: EM | Admit: 2017-03-20 | Discharge: 2017-03-21 | Disposition: A | Discharge status: Home / Self Care | Payer: Medicare Other | Attending: Emergency Medicine | Admitting: Emergency Medicine

## 2017-03-20 ENCOUNTER — Emergency Department (HOSPITAL_COMMUNITY): Payer: Medicare Other

## 2017-03-20 DIAGNOSIS — W050XXA Fall from non-moving wheelchair, initial encounter: Secondary | ICD-10-CM | POA: Diagnosis not present

## 2017-03-20 DIAGNOSIS — Z79899 Other long term (current) drug therapy: Secondary | ICD-10-CM | POA: Insufficient documentation

## 2017-03-20 DIAGNOSIS — Y929 Unspecified place or not applicable: Secondary | ICD-10-CM | POA: Insufficient documentation

## 2017-03-20 DIAGNOSIS — I252 Old myocardial infarction: Secondary | ICD-10-CM | POA: Diagnosis not present

## 2017-03-20 DIAGNOSIS — Z87891 Personal history of nicotine dependence: Secondary | ICD-10-CM | POA: Insufficient documentation

## 2017-03-20 DIAGNOSIS — Z951 Presence of aortocoronary bypass graft: Secondary | ICD-10-CM | POA: Diagnosis not present

## 2017-03-20 DIAGNOSIS — N183 Chronic kidney disease, stage 3 (moderate): Secondary | ICD-10-CM | POA: Insufficient documentation

## 2017-03-20 DIAGNOSIS — W19XXXA Unspecified fall, initial encounter: Secondary | ICD-10-CM

## 2017-03-20 DIAGNOSIS — R0789 Other chest pain: Secondary | ICD-10-CM | POA: Diagnosis present

## 2017-03-20 DIAGNOSIS — Z8673 Personal history of transient ischemic attack (TIA), and cerebral infarction without residual deficits: Secondary | ICD-10-CM | POA: Insufficient documentation

## 2017-03-20 DIAGNOSIS — I129 Hypertensive chronic kidney disease with stage 1 through stage 4 chronic kidney disease, or unspecified chronic kidney disease: Secondary | ICD-10-CM | POA: Diagnosis not present

## 2017-03-20 DIAGNOSIS — S2232XA Fracture of one rib, left side, initial encounter for closed fracture: Secondary | ICD-10-CM | POA: Diagnosis not present

## 2017-03-20 DIAGNOSIS — Y999 Unspecified external cause status: Secondary | ICD-10-CM | POA: Insufficient documentation

## 2017-03-20 DIAGNOSIS — J449 Chronic obstructive pulmonary disease, unspecified: Secondary | ICD-10-CM | POA: Insufficient documentation

## 2017-03-20 DIAGNOSIS — I2510 Atherosclerotic heart disease of native coronary artery without angina pectoris: Secondary | ICD-10-CM | POA: Diagnosis not present

## 2017-03-20 DIAGNOSIS — Z7982 Long term (current) use of aspirin: Secondary | ICD-10-CM | POA: Diagnosis not present

## 2017-03-20 DIAGNOSIS — Y9389 Activity, other specified: Secondary | ICD-10-CM | POA: Diagnosis not present

## 2017-03-20 DIAGNOSIS — M25552 Pain in left hip: Secondary | ICD-10-CM | POA: Insufficient documentation

## 2017-03-20 DIAGNOSIS — S098XXA Other specified injuries of head, initial encounter: Secondary | ICD-10-CM | POA: Diagnosis not present

## 2017-03-20 MED ORDER — TRAMADOL HCL 50 MG PO TABS: 50.0000 mg | ORAL_TABLET | Freq: Four times a day (QID) | ORAL | 0 refills | Status: DC | PRN

## 2017-03-20 NOTE — ED Notes (Signed)
Waiting on [facility]... 

## 2017-03-20 NOTE — ED Notes (Signed)
On way to CT/XR 

## 2017-03-20 NOTE — ED Notes (Signed)
Re-wrapped patient's 3 skin tears that were previously wrapped at ALF.

## 2017-03-20 NOTE — ED Triage Notes (Signed)
Per EMS, patient was in bathroom waiting for nursing staff but decided to get up on own, became dizzy, and fell.  Hit left side on floor and head on trash can. No LOC.  Complaining of left lower back pain and left flank pain.  Tender on palpation.  No deformities noted.  152/90, HR 84, RR 16, CBG 138.  Hx of orthostatic hypotension, AAA, CKD.

## 2017-03-20 NOTE — ED Provider Notes (Signed)
[facility] EMERGENCY DEPARTMENT Provider Note   CSN: [id number] Arrival date & time: 03/20/17  2047     History   Chief Complaint Chief Complaint  Patient presents with  . Fall    HPI Tonya Payne is a 82 y.o. female.  HPI Patient presents by EMS for unwitnessed fall.  She states she got up from her wheelchair and lost her balance and fell onto her left side.  Unsure whether she hit her head.  No loss of consciousness.  Complains of left rib pains and left hip pain.  No focal weakness or numbness.  Vital signs stable en route. Past Medical History:  Diagnosis Date  . AAA (abdominal aortic aneurysm) (HCC)   . AAA (abdominal aortic aneurysm) without rupture (HCC) 2016   CTA 10/23/2015 - saccular infrarenal aneurysm roughly 4.8 cm in diameter. Stable from 2016 -- Dr. Darrick Penna  . Anterior wall myocardial infarction (HCC) 10/08/2015   Presented with severe chest pain and dynamic anterior ST elevations/biphasic elevations area did not meet full criteria for STEMI, and troponin was not dramatically elevated. 95% LAD treated with PTCA followed by CABG  . Anxiety   . Atrial fibrillation, new onset (HCC) 10/2015   after CABG, in SR at d/c  . CAD (coronary artery disease), native coronary artery 10/08/2015   Severe coronary disease with calcified left main and LAD: 5% left main going into ostial LAD. Mid LAD 95% (treated with PTCA). Proximal RCA 50%, calcified. EF was 20-25% with apical akinesis and distal anterior hypokinesis. --> Referred for CABG  . CAP (community acquired pneumonia) 07/2013   Hattie Perch 07/09/2013  . Congestive dilated cardiomyopathy (HCC) 10/08/2015   Intra-Op TEE showed EF of 40 and 45%. She had an echo with a "normal LV function "documented, but there was no reading M.D.  . COPD (chronic obstructive pulmonary disease) (HCC)   . Depression   . Headache    "weekly" (12/31/2016)  . Hyperlipidemia LDL goal <70 10/08/2015  . IBS (irritable bowel syndrome)     . Migraine    "none in years" (12/31/2016)  . Oral thrush   . Post PTCA 10/08/2015   Presented with STEMI, severe mid LAD lesion treated with PTCA and then referred for CABG.  . Syncope   . TIA (transient ischemic attack)     Patient Active Problem List   Diagnosis Date Noted  . Syncope due to autonomic failure 02/25/2017  . Hyperglycemia 02/24/2017  . Diarrhea 02/24/2017  . Compression fx, lumbar spine (HCC) 02/24/2017  . Constipation 02/02/2017  . Hypomagnesemia 01/31/2017  . Orthostatic hypotension 01/31/2017  . Syncope and collapse 01/29/2017  . Nausea vomiting and diarrhea 01/29/2017  . Hypertensive urgency 01/29/2017  . COPD (chronic obstructive pulmonary disease) (HCC) 12/30/2016  . Closed compression fracture of thoracic vertebra (HCC) 12/30/2016  . Abdominal pain 12/30/2016  . Alcohol use 12/30/2016  . CAD (coronary artery disease) 12/30/2016  . Scalp laceration   . Acute renal failure superimposed on stage 3 chronic kidney disease (HCC)   . Dehydration   . Physical deconditioning   . Pressure injury of skin 11/12/2016  . CKD (chronic kidney disease), stage III (HCC) 09/24/2016  . Closed left hip fracture, with routine healing, subsequent encounter 09/23/2016  . Hyponatremia 09/23/2016  . Hypertension 09/23/2016  . Left displaced femoral neck fracture (HCC)   . Closed fracture of multiple pubic rami, right, sequela 02/27/2016  . Displaced fracture of neck of right second metacarpal bone with routine healing 02/27/2016  .  Protein-calorie malnutrition, severe (HCC) 02/22/2016  . Orthostatic dizziness 02/22/2016  . Postoperative atrial fibrillation (HCC) 02/22/2016  . Fall   . Hand fracture   . Pelvic fracture (HCC) 01/26/2016  . AAA (abdominal aortic aneurysm) (HCC) 11/30/2015  . Post PTCA   . S/P CABG x 3 10/16/2015  . Coronary artery disease involving native coronary artery with angina pectoris (HCC) 10/08/2015  . Congestive dilated cardiomyopathy (HCC)  10/08/2015  . Hyperlipidemia LDL goal <70 10/08/2015  . E. coli UTI (urinary tract infection) 10/06/2015  . Myocardial infarction involving left anterior descending (LAD) coronary artery (HCC) 10/06/2015    Past Surgical History:  Procedure Laterality Date  . ABDOMINAL HYSTERECTOMY    . BACK SURGERY    . CARDIAC CATHETERIZATION N/A 10/06/2015   Procedure: Left Heart Cath and Coronary Angiography;  Surgeon: Marykay Lex, MD;  Location: [facility] INVASIVE CV LAB;  Service: Cardiovascular;: Heavily calcified left main-LAD. 75% LM & Ost LAD, 95% d-mLAD, p-mRCA Calcified 50%. EF ~20-25%  . CARDIAC CATHETERIZATION N/A 10/06/2015   Procedure: Coronary Balloon Angioplasty;  Surgeon: Marykay Lex, MD;  Location: [facility] INVASIVE CV LAB;  Service: Cardiovascular: PTCA of 95% LAD in setting of STEMI - reduced to 70% --> would need Atherectomy-PCI vs. CABG.  Sent for CABG.  . COLECTOMY    . CORONARY ARTERY BYPASS GRAFT N/A 10/16/2015   Procedure: CORONARY ARTERY BYPASS GRAFTING (CABG)  x three, using left internal mammary artery and right leg greater saphenous vein harvested endoscopically;  Surgeon: Loreli Slot, MD;  Location: [facility] OR;  Service: Open Heart Surgery;  Laterality: N/A;  . FRACTURE SURGERY    . HIP PINNING,CANNULATED Left 09/24/2016   Procedure: CANNULATED HIP PINNING LEFT HIP;  Surgeon: Durene Romans, MD;  Location: [facility] ORS;  Service: Orthopedics;  Laterality: Left;  . LUMBAR DISC SURGERY     L3,4,5; S1"  . NECK SURGERY    . TEE WITHOUT CARDIOVERSION N/A 10/16/2015   Procedure: TRANSESOPHAGEAL ECHOCARDIOGRAM (TEE);  Surgeon: Loreli Slot, MD;  Location: [facility] OR;  Service: Open Heart Surgery - IntraOp:  EF 40-45%.  . TONSILLECTOMY    . TOTAL HIP ARTHROPLASTY Right     OB History    No data available       Home Medications    Prior to Admission medications   Medication Sig Start Date End Date Taking? Authorizing Provider  aspirin EC 81 MG tablet Take 81 mg by mouth every  evening.    Yes [provider]  atorvastatin (LIPITOR) 40 MG tablet Take 1 tablet (40 mg total) by mouth daily at 6 PM. 12/18/15  Yes Barrett, Erin R, PA-C  docusate sodium (COLACE) 100 MG capsule Take 1 capsule (100 mg total) by mouth 2 (two) times daily. 09/24/16  Yes Babish, Molli Hazard, PA-C  fexofenadine (ALLEGRA) 180 MG tablet Take 180 mg by mouth every evening.    Yes [provider]  fluticasone (FLONASE) 50 MCG/ACT nasal spray Place 1 spray into both nostrils daily. Patient taking differently: Place 1 spray into both nostrils at bedtime.  11/16/16  Yes Vassie Loll, MD  megestrol (MEGACE) 40 MG/ML suspension Take 800 mg by mouth daily as needed (appetite).  11/29/16  Yes [provider]  methocarbamol (ROBAXIN) 500 MG tablet Take 1 tablet (500 mg total) by mouth every 8 (eight) hours as needed for muscle spasms. 01/01/17  Yes Maxie Barb, MD  metoprolol tartrate (LOPRESSOR) 25 MG tablet Take 12.5 mg by mouth. 07/03/16  Yes [provider]  Multiple Vitamin (MULTIVITAMIN WITH MINERALS) TABS tablet Take 1 tablet by mouth every evening.   Yes [provider]  OLANZapine (ZYPREXA) 2.5 MG tablet Take 1 tablet (2.5 mg total) by mouth every evening. Patient taking differently: Take 2.5 mg by mouth at bedtime.  02/14/17  Yes Rolly SalterPatel, Pranav M, MD  polyethylene glycol [facility](MIRALAX / GLYCOLAX) packet Take 17 g by mouth daily as needed. Patient taking differently: Take 17 g by mouth daily as needed for mild constipation. Mix in 4-8 oz liquid and drink 01/01/17  Yes Maxie BarbBhandari, Dron Prasad, MD  sertraline (ZOLOFT) 50 MG tablet Take 0.5 tablets (25 mg total) by mouth every morning. Patient taking differently: Take 25 mg by mouth daily.  11/16/16  Yes Vassie LollMadera, Carlos, MD  fludrocortisone (FLORINEF) 0.1 MG tablet Take 2 tablets (0.2 mg total) by mouth daily. Patient not taking: Reported on 03/20/2017 02/15/17   Rolly SalterPatel, Pranav M, MD  senna (SENOKOT) 8.6 MG TABS tablet  Take 1 tablet (8.6 mg total) by mouth daily. Patient not taking: Reported on 03/20/2017 02/27/17   Regalado, Jon BillingsBelkys A, MD  traMADol (ULTRAM) 50 MG tablet Take 1 tablet (50 mg total) every 6 (six) hours as needed by mouth. 03/20/17   Loren RacerYelverton, Milanie Rosenfield, MD    Family History Family History  Problem Relation Age of Onset  . Stroke Mother        dead  . Clotting disorder Father        dead  bone marrow disease    Social History Social History   Tobacco Use  . Smoking status: Former Smoker    Packs/day: 0.50    Years: 50.00    Pack years: 25.00    Types: Cigarettes    Last attempt to quit: 04/11/2015    Years since quitting: 1.9  . Smokeless tobacco: Never Used  Substance Use Topics  . Alcohol use: Yes    Comment: 12/31/2016 "glass of wine maybe once/month"  . Drug use: No     Allergies   Patient has no known allergies.   Review of Systems Review of Systems  Constitutional: Negative for chills and fever.  HENT: Negative for facial swelling and trouble swallowing.   Eyes: Negative for visual disturbance.  Respiratory: Negative for shortness of breath.   Cardiovascular: Negative for chest pain, palpitations and leg swelling.  Gastrointestinal: Negative for abdominal pain, diarrhea, nausea and vomiting.  Genitourinary: Negative for dysuria.  Musculoskeletal: Positive for arthralgias, back pain and myalgias. Negative for neck pain and neck stiffness.  Skin: Negative for rash and wound.  Neurological: Positive for dizziness and light-headedness. Negative for syncope, weakness, numbness and headaches.  All other systems reviewed and are negative.    Physical Exam Updated Vital Signs BP (!) 141/78   Pulse 83   Temp 98.7 F (37.1 C) (Oral)   Resp 14   Ht 5' (1.524 m)   Wt 47.2 kg (104 lb)   SpO2 98%   BMI 20.31 kg/m   Physical Exam  Constitutional: She is oriented to person, place, and time. She appears well-developed and well-nourished. No distress.  HENT:  Head:  Normocephalic and atraumatic.  Mouth/Throat: Oropharynx is clear and moist.  No intraoral trauma.  No evidence of scalp trauma.  Eyes: EOM are normal. Pupils are equal, round, and reactive to light.  Neck: Normal range of motion. Neck supple.  Minimal midline cervical tenderness without step-offs  Cardiovascular: Normal rate and regular rhythm. Exam reveals no gallop and no friction rub.  No murmur heard. Pulmonary/Chest: Effort normal and breath sounds normal. No stridor. No respiratory distress. She has no wheezes. She has no rales. She exhibits no tenderness.  Abdominal: Soft. Bowel sounds are normal. There is no tenderness. There is no rebound and no guarding.  Musculoskeletal: Normal range of motion. She exhibits tenderness. She exhibits no edema.  Patient has mild left posterior rib tenderness, midline lumbar spinal tenderness and left lateral hip tenderness to palpation.  Full range of motion of the left hip.  No shortening of the lower extremities.  Distal pulses are intact.  Neurological: She is alert and oriented to person, place, and time.  5/5 motor in all extremities.  Sensation fully intact.  Skin: Skin is warm and dry. Capillary refill takes less than 2 seconds. No rash noted. She is not diaphoretic. No erythema.  Psychiatric: She has a normal mood and affect. Her behavior is normal.  Nursing note and vitals reviewed.    ED Treatments / Results  Labs (all labs ordered are listed, but only abnormal results are displayed) Labs Reviewed - No data to display  EKG  EKG Interpretation None       Radiology No results found.  Procedures Procedures (including critical care time)  Medications Ordered in ED Medications - No data to display   Initial Impression / Assessment and Plan / ED Course  I have reviewed the triage vital signs and the nursing notes.  Pertinent labs & imaging results that were available during my care of the patient were reviewed by me and  considered in my medical decision making (see chart for details).       Final Clinical Impressions(s) / ED Diagnoses   Final diagnoses:  Fall, initial encounter  Closed fracture of one rib of left side, initial encounter    ED Discharge Orders        Ordered    traMADol (ULTRAM) 50 MG tablet  Every 6 hours PRN     03/20/17 2242       Loren RacerYelverton, Kohl Polinsky, MD 03/25/17 1233

## 2017-03-21 IMAGING — CR DG CHEST 1V PORT
1 series · 1 of 1 positions shown · non-contrast
Comparison: 10/16/2015.

CLINICAL DATA: CABG.  Chest tube placement.

EXAM:
PORTABLE CHEST 1 VIEW

[AP]
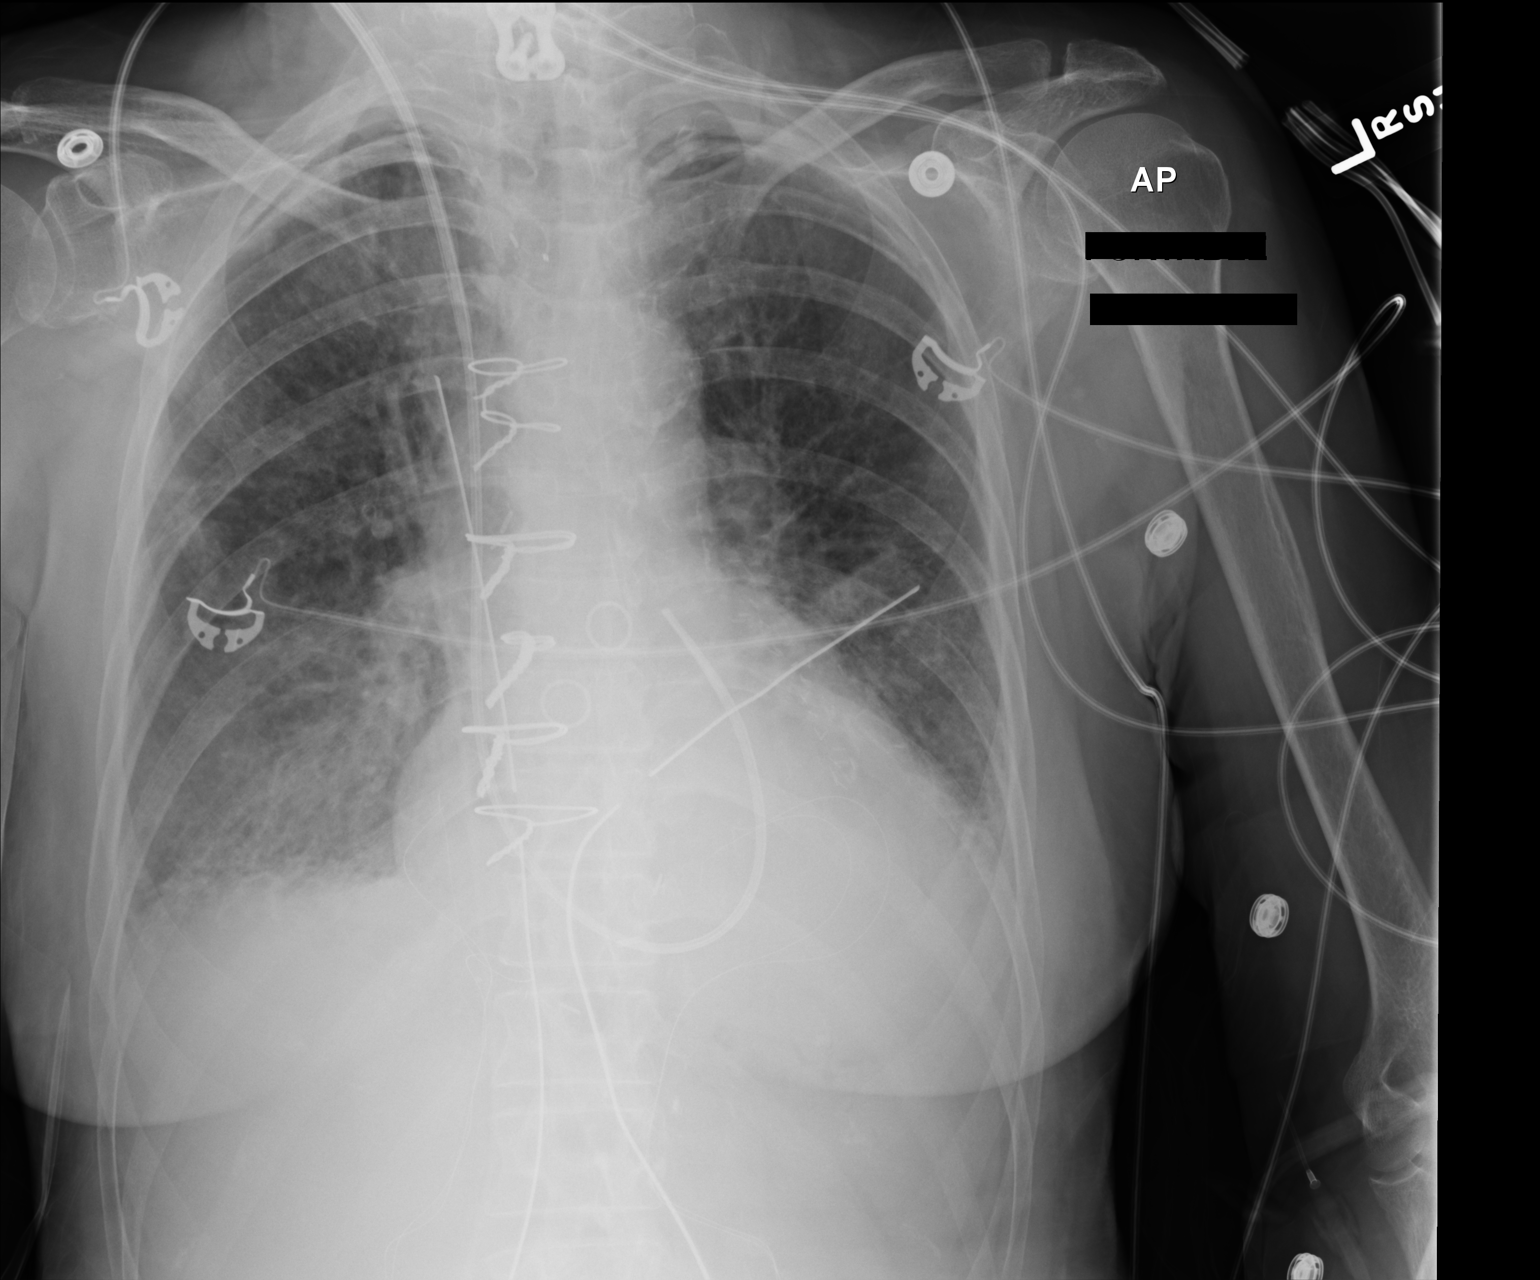

[1 of 1 positions shown; findings below may reference images not displayed]

FINDINGS: Interim extubation removal of NG tube. Bilateral chest tubes and
Swan-Ganz catheter in stable position. Prior CABG. Cardiomegaly with
diffuse bilateral from interstitial prominence and small bilateral
pleural effusions consistent congestive heart failure. Low lung
volumes. Tiny left apical pneumothorax noted. Prior cervical spine
fusion.
IMPRESSION: 1. Interim extubation and removal of NG tube. Swan-Ganz catheter and
bilateral chest tubes in stable position. Tiny left apical
pneumothorax noted.

2. Prior CABG. Cardiomegaly with bilateral pulmonary interstitial
prominence and bilateral pleural effusions consistent with
congestive heart failure.

Critical Value/emergent results were called by telephone at the time
of interpretation on 10/17/2015 at [DATE] to nurse Blain, who
verbally acknowledged these results.

## 2017-03-23 IMAGING — CR DG CHEST 1V PORT
1 series · 1 of 1 positions shown · non-contrast
Comparison: Chest radiograph from one day prior.

CLINICAL DATA: Status post CABG

EXAM:
PORTABLE CHEST 1 VIEW

[AP]
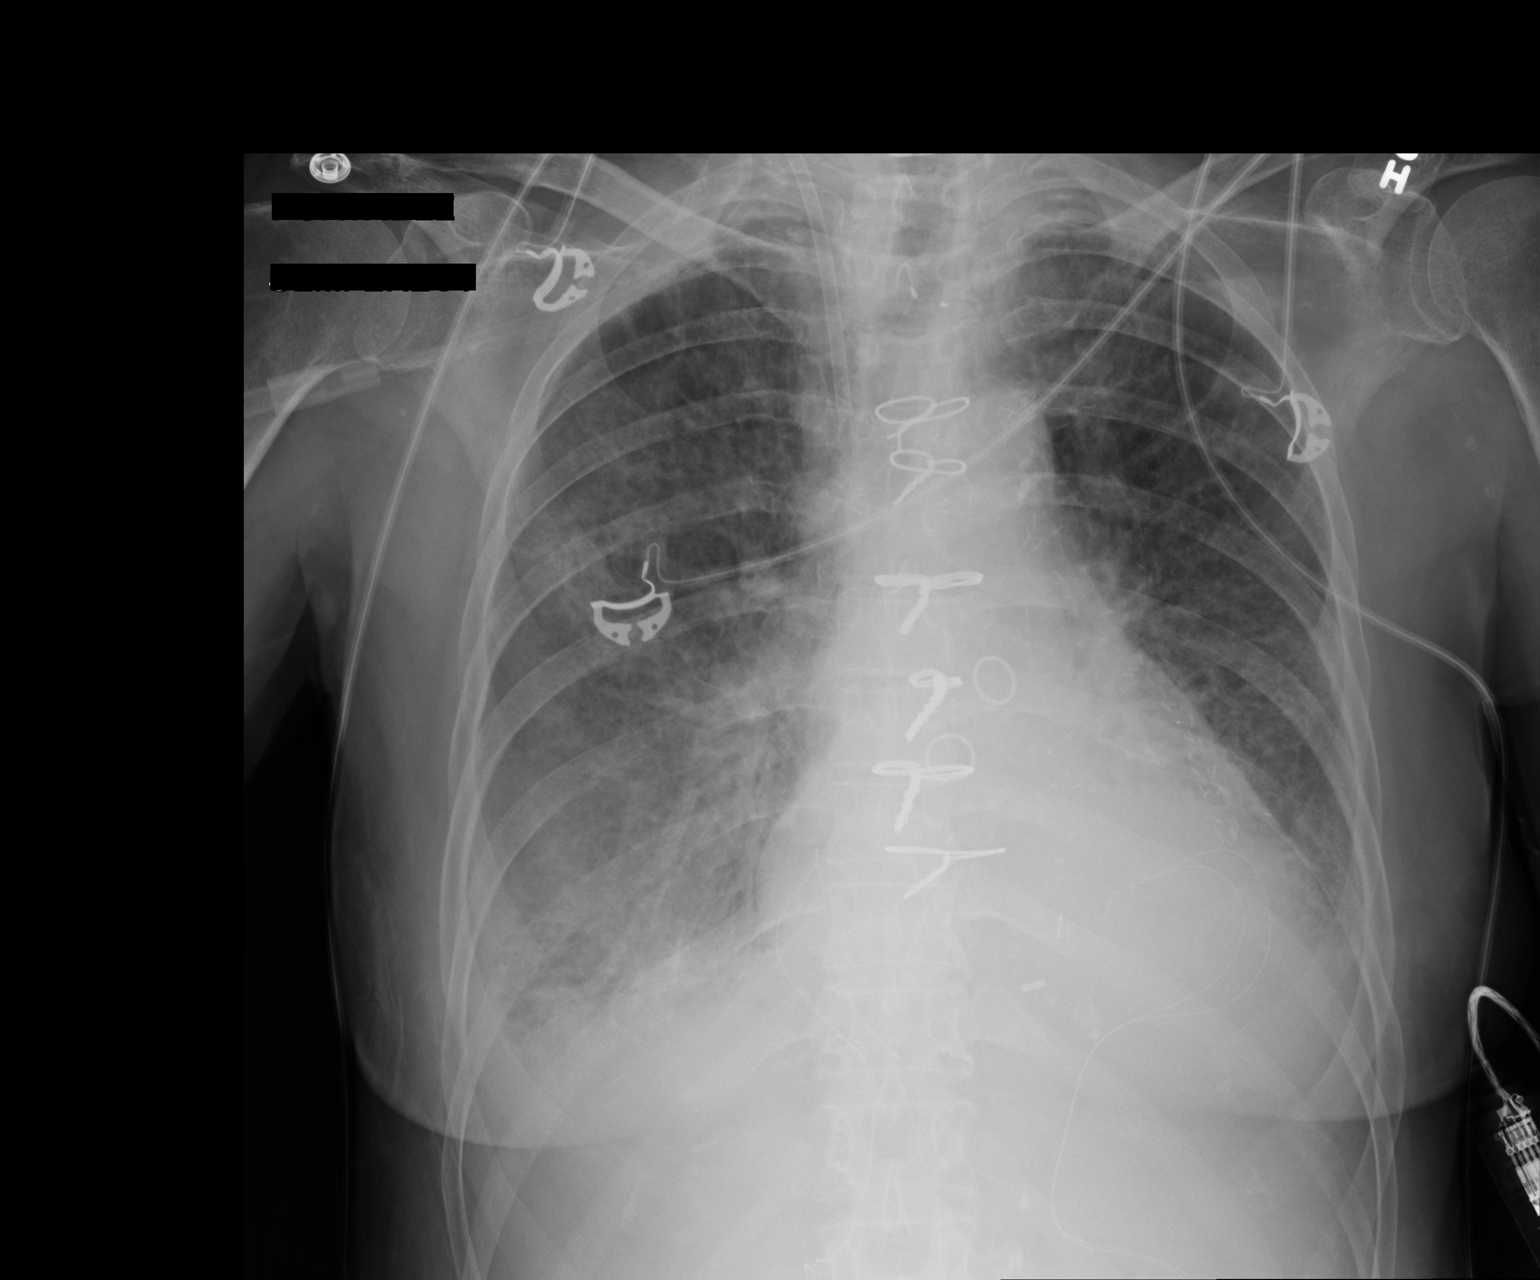

[1 of 1 positions shown; findings below may reference images not displayed]

FINDINGS: Right internal jugular central venous sheath terminates in the right
brachiocephalic vein. Sternotomy wires appear aligned and intact.
CABG clips overlie the mediastinum. Epicardial pacer leads overlie
the heart. Partially visualized surgical hardware from [facility] overlies
the lower cervical spine. Stable cardiomediastinal silhouette with
mild cardiomegaly. No pneumothorax. Small bilateral pleural
effusions, slightly decreased bilaterally. Stable mild pulmonary
edema. Patchy bibasilar lung opacities appear slightly decreased
bilaterally.
IMPRESSION: 1. Stable mild cardiomegaly and mild pulmonary edema .
2. Small bilateral pleural effusions, slightly decreased
bilaterally.
3. Patchy bibasilar lung opacities, slightly decreased bilaterally,
favor atelectasis.

## 2018-05-07 ENCOUNTER — Encounter (HOSPITAL_COMMUNITY): Payer: Self-pay | Admitting: *Deleted

## 2018-05-07 ENCOUNTER — Emergency Department (HOSPITAL_COMMUNITY): Payer: Medicare Other

## 2018-05-07 ENCOUNTER — Other Ambulatory Visit: Payer: Self-pay

## 2018-05-07 ENCOUNTER — Observation Stay (HOSPITAL_COMMUNITY)
Admission: EM | Admit: 2018-05-07 | Discharge: 2018-05-08 | Disposition: A | Discharge status: Skilled Nursing Facility | Payer: Medicare Other | Attending: Internal Medicine | Admitting: Internal Medicine

## 2018-05-07 DIAGNOSIS — I420 Dilated cardiomyopathy: Secondary | ICD-10-CM | POA: Diagnosis not present

## 2018-05-07 DIAGNOSIS — N183 Chronic kidney disease, stage 3 unspecified: Secondary | ICD-10-CM | POA: Diagnosis present

## 2018-05-07 DIAGNOSIS — E785 Hyperlipidemia, unspecified: Secondary | ICD-10-CM | POA: Diagnosis not present

## 2018-05-07 DIAGNOSIS — Z87891 Personal history of nicotine dependence: Secondary | ICD-10-CM | POA: Diagnosis not present

## 2018-05-07 DIAGNOSIS — Z8673 Personal history of transient ischemic attack (TIA), and cerebral infarction without residual deficits: Secondary | ICD-10-CM | POA: Insufficient documentation

## 2018-05-07 DIAGNOSIS — N179 Acute kidney failure, unspecified: Secondary | ICD-10-CM | POA: Diagnosis not present

## 2018-05-07 DIAGNOSIS — Z951 Presence of aortocoronary bypass graft: Secondary | ICD-10-CM | POA: Insufficient documentation

## 2018-05-07 DIAGNOSIS — I25119 Atherosclerotic heart disease of native coronary artery with unspecified angina pectoris: Secondary | ICD-10-CM | POA: Insufficient documentation

## 2018-05-07 DIAGNOSIS — F329 Major depressive disorder, single episode, unspecified: Secondary | ICD-10-CM | POA: Insufficient documentation

## 2018-05-07 DIAGNOSIS — R911 Solitary pulmonary nodule: Secondary | ICD-10-CM | POA: Diagnosis not present

## 2018-05-07 DIAGNOSIS — Z79891 Long term (current) use of opiate analgesic: Secondary | ICD-10-CM | POA: Diagnosis not present

## 2018-05-07 DIAGNOSIS — F419 Anxiety disorder, unspecified: Secondary | ICD-10-CM | POA: Diagnosis not present

## 2018-05-07 DIAGNOSIS — I714 Abdominal aortic aneurysm, without rupture, unspecified: Secondary | ICD-10-CM | POA: Diagnosis present

## 2018-05-07 DIAGNOSIS — I4891 Unspecified atrial fibrillation: Secondary | ICD-10-CM | POA: Insufficient documentation

## 2018-05-07 DIAGNOSIS — I951 Orthostatic hypotension: Secondary | ICD-10-CM | POA: Insufficient documentation

## 2018-05-07 DIAGNOSIS — J449 Chronic obstructive pulmonary disease, unspecified: Secondary | ICD-10-CM | POA: Insufficient documentation

## 2018-05-07 DIAGNOSIS — Z7982 Long term (current) use of aspirin: Secondary | ICD-10-CM | POA: Diagnosis not present

## 2018-05-07 DIAGNOSIS — Z79899 Other long term (current) drug therapy: Secondary | ICD-10-CM | POA: Diagnosis not present

## 2018-05-07 DIAGNOSIS — I2510 Atherosclerotic heart disease of native coronary artery without angina pectoris: Secondary | ICD-10-CM | POA: Diagnosis present

## 2018-05-07 DIAGNOSIS — R5381 Other malaise: Secondary | ICD-10-CM | POA: Diagnosis present

## 2018-05-07 DIAGNOSIS — R55 Syncope and collapse: Secondary | ICD-10-CM | POA: Diagnosis not present

## 2018-05-07 HISTORY — DX: Syncope and collapse: R55

## 2018-05-07 LAB — BASIC METABOLIC PANEL
ANION GAP: 10 (ref 5–15)
BUN: 25 mg/dL — ABNORMAL HIGH (ref 8–23)
CO2: 23 mmol/L (ref 22–32)
Calcium: 9.2 mg/dL (ref 8.9–10.3)
Chloride: 104 mmol/L (ref 98–111)
Creatinine, Ser: 1.41 mg/dL — ABNORMAL HIGH (ref 0.44–1.00)
GFR calc Af Amer: 40 mL/min — ABNORMAL LOW (ref >60)
GFR, EST NON AFRICAN AMERICAN: 35 mL/min — AB (ref >60)
GLUCOSE: 93 mg/dL (ref 70–99)
POTASSIUM: 4.7 mmol/L (ref 3.5–5.1)
SODIUM: 137 mmol/L (ref 135–145)

## 2018-05-07 LAB — TSH: TSH: 2.533 u[IU]/mL (ref 0.350–4.500)

## 2018-05-07 LAB — URINALYSIS, ROUTINE W REFLEX MICROSCOPIC
BILIRUBIN URINE: NEGATIVE
Glucose, UA: NEGATIVE mg/dL
HGB URINE DIPSTICK: NEGATIVE
Ketones, ur: 5 mg/dL — AB
Leukocytes, UA: NEGATIVE
Nitrite: NEGATIVE
PROTEIN: NEGATIVE mg/dL
SPECIFIC GRAVITY, URINE: 1.02 (ref 1.005–1.030)
pH: 5 (ref 5.0–8.0)

## 2018-05-07 LAB — CBC
HCT: 37.5 % (ref 36.0–46.0)
HEMOGLOBIN: 11.8 g/dL — AB (ref 12.0–15.0)
MCH: 30.3 pg (ref 26.0–34.0)
MCHC: 31.5 g/dL (ref 30.0–36.0)
MCV: 96.2 fL (ref 80.0–100.0)
PLATELETS: 164 10*3/uL (ref 150–400)
RBC: 3.9 MIL/uL (ref 3.87–5.11)
RDW: 13.2 % (ref 11.5–15.5)
WBC: 7.8 10*3/uL (ref 4.0–10.5)
nRBC: 0 % (ref 0.0–0.2)

## 2018-05-07 LAB — FOLATE: Folate: 21 ng/mL (ref >5.9)

## 2018-05-07 LAB — T4, FREE: Free T4: 0.98 ng/dL (ref 0.82–1.77)

## 2018-05-07 LAB — VITAMIN B12: Vitamin B-12: 628 pg/mL (ref 180–914)

## 2018-05-07 MED ORDER — ACETAMINOPHEN 325 MG PO TABS
650.0000 mg | ORAL_TABLET | Freq: Once | ORAL | Status: AC
  Administered 2018-05-07: 650 mg via ORAL
  Filled 2018-05-07: qty 2

## 2018-05-07 MED ORDER — IOPAMIDOL (ISOVUE-370) INJECTION 76%
100.0000 mL | Freq: Once | INTRAVENOUS | Status: AC | PRN
  Administered 2018-05-07: 100 mL via INTRAVENOUS

## 2018-05-07 MED ORDER — FLUTICASONE PROPIONATE 50 MCG/ACT NA SUSP
1.0000 | Freq: Every day | NASAL | Status: DC
  Administered 2018-05-07: 1 via NASAL
  Filled 2018-05-07: qty 16

## 2018-05-07 MED ORDER — SODIUM CHLORIDE 0.9 % IV SOLN: INTRAVENOUS | Status: DC

## 2018-05-07 MED ORDER — GLUCOSAMINE SULFATE 500 MG PO CAPS: 1500.0000 mg | ORAL_CAPSULE | Freq: Every day | ORAL | Status: DC

## 2018-05-07 MED ORDER — HEPARIN SODIUM (PORCINE) 5000 UNIT/ML IJ SOLN
5000.0000 [IU] | Freq: Three times a day (TID) | INTRAMUSCULAR | Status: DC
  Administered 2018-05-07 – 2018-05-08 (×2): 5000 [IU] via SUBCUTANEOUS
  Filled 2018-05-07 (×2): qty 1

## 2018-05-07 MED ORDER — IOPAMIDOL (ISOVUE-370) INJECTION 76%
INTRAVENOUS | Status: AC
  Filled 2018-05-07: qty 100

## 2018-05-07 MED ORDER — SODIUM CHLORIDE 0.9 % IV SOLN
INTRAVENOUS | Status: DC
  Administered 2018-05-07: 17:00:00 via INTRAVENOUS

## 2018-05-07 MED ORDER — ADULT MULTIVITAMIN W/MINERALS CH
1.0000 | ORAL_TABLET | Freq: Every evening | ORAL | Status: DC
  Administered 2018-05-07: 1 via ORAL
  Filled 2018-05-07: qty 1

## 2018-05-07 MED ORDER — TRAZODONE HCL 50 MG PO TABS
25.0000 mg | ORAL_TABLET | Freq: Every day | ORAL | Status: DC
  Administered 2018-05-07: 25 mg via ORAL
  Filled 2018-05-07: qty 1

## 2018-05-07 MED ORDER — POLYETHYLENE GLYCOL 3350 17 G PO PACK: 17.0000 g | PACK | Freq: Every day | ORAL | Status: DC | PRN

## 2018-05-07 MED ORDER — DIVALPROEX SODIUM 250 MG PO DR TAB
250.0000 mg | DELAYED_RELEASE_TABLET | Freq: Every day | ORAL | Status: DC
  Administered 2018-05-08: 250 mg via ORAL
  Filled 2018-05-07: qty 1

## 2018-05-07 MED ORDER — ACETAMINOPHEN 500 MG PO TABS
1000.0000 mg | ORAL_TABLET | Freq: Three times a day (TID) | ORAL | Status: DC | PRN
  Administered 2018-05-07: 1000 mg via ORAL
  Filled 2018-05-07: qty 2

## 2018-05-07 MED ORDER — OLANZAPINE 2.5 MG PO TABS
2.5000 mg | ORAL_TABLET | Freq: Every day | ORAL | Status: DC
  Administered 2018-05-07: 2.5 mg via ORAL
  Filled 2018-05-07 (×2): qty 1

## 2018-05-07 MED ORDER — SODIUM CHLORIDE 0.9 % IV BOLUS
1000.0000 mL | Freq: Once | INTRAVENOUS | Status: AC
  Administered 2018-05-07: 1000 mL via INTRAVENOUS

## 2018-05-07 MED ORDER — MECLIZINE HCL 25 MG PO TABS
12.5000 mg | ORAL_TABLET | Freq: Three times a day (TID) | ORAL | Status: DC
  Administered 2018-05-07 – 2018-05-08 (×2): 12.5 mg via ORAL
  Filled 2018-05-07 (×2): qty 1

## 2018-05-07 MED ORDER — ARIPIPRAZOLE 2 MG PO TABS
2.0000 mg | ORAL_TABLET | Freq: Every day | ORAL | Status: DC
  Administered 2018-05-08: 2 mg via ORAL
  Filled 2018-05-07: qty 1

## 2018-05-07 MED ORDER — DOCUSATE SODIUM 100 MG PO CAPS
100.0000 mg | ORAL_CAPSULE | Freq: Two times a day (BID) | ORAL | Status: DC
  Filled 2018-05-07 (×2): qty 1

## 2018-05-07 MED ORDER — MECLIZINE HCL 25 MG PO TABS: 12.5000 mg | ORAL_TABLET | Freq: Two times a day (BID) | ORAL | Status: DC | PRN

## 2018-05-07 MED ORDER — SENNA 8.6 MG PO TABS
1.0000 | ORAL_TABLET | Freq: Every day | ORAL | Status: DC
  Filled 2018-05-07: qty 1

## 2018-05-07 MED ORDER — MELATONIN 3 MG PO TABS
3.0000 mg | ORAL_TABLET | Freq: Every day | ORAL | Status: DC
  Administered 2018-05-07: 3 mg via ORAL
  Filled 2018-05-07 (×2): qty 1

## 2018-05-07 MED ORDER — ASPIRIN EC 81 MG PO TBEC
81.0000 mg | DELAYED_RELEASE_TABLET | Freq: Every evening | ORAL | Status: DC
  Administered 2018-05-07: 81 mg via ORAL
  Filled 2018-05-07: qty 1

## 2018-05-07 MED ORDER — DIVALPROEX SODIUM 250 MG PO DR TAB
500.0000 mg | DELAYED_RELEASE_TABLET | Freq: Every day | ORAL | Status: DC
  Administered 2018-05-07: 500 mg via ORAL
  Filled 2018-05-07: qty 2

## 2018-05-07 NOTE — ED Triage Notes (Signed)
Pt in via [facility] from [facility] d/t witnessed syncopal episode lasting "less than 5 mins" per SNF staff with BP 70/50 with episode at SNF, A&O x4, pt hx of Dementia, orthostatic with EMS, # 20 L AC on RA, NSR

## 2018-05-07 NOTE — H&P (Signed)
TRH H&P   Patient Demographics:    Tonya Payne, is a 84 y.o. female  MRN: [id number]   DOB - [birth year]  Admit Date - 05/07/2018  Outpatient Primary MD for the patient is Rocky Morel, MD  Referring MD/NP/PA: PA Idelle Leech  Patient coming from: [facility]  Chief Complaint  Patient presents with  . Loss of Consciousness      HPI:    Tonya Payne  is a 84 y.o. female, with past medical history of CAD, COPD, anxiety, depression, hyperlipidemia, history of orthostasis, with syncope in the past, is to ED with syncopal episode at [facility], and is poor historian, history was obtained from ED staff and family, patient had syncopal events witnessed by nursing staff, she was sitting in wheelchair, patient was unresponsiveness, did not respond to sternal rub, episode lasted for few minutes, hard to wake up and responding to voice, patient denies any focal deficits, tingling, numbness, no facial droop noted, no seizure activity noted. -In ED patient was noted to be orthostatic, actually by EMS she was hypotensive 70/50, CT a chest abdomen pelvis significant for spiculated 2 cm lung mass, and 6.8 cm AAA, patient mentation back to baseline, CT head with no acute findings    Review of systems:    In addition to the HPI above,  No Fever-chills, he reports syncope No Headache, No changes with Vision or hearing, No problems swallowing food or Liquids, No Chest pain, Cough or Shortness of Breath, No Abdominal pain, No Nausea or Vommitting, Bowel movements are regular, No Blood in stool or Urine, No dysuria, No new skin rashes or bruises, No new joints pains-aches,  No new weakness, tingling, numbness in any extremity, No recent weight gain or loss, No polyuria, polydypsia or polyphagia, No significant Mental Stressors.  A full 10 point Review of Systems was done, except as stated above,  all other Review of Systems were negative.   With Past History of the following :    Past Medical History:  Diagnosis Date  . AAA (abdominal aortic aneurysm) (HCC)   . AAA (abdominal aortic aneurysm) without rupture (HCC) 2016   CTA 10/23/2015 - saccular infrarenal aneurysm roughly 4.8 cm in diameter. Stable from 2016 -- Dr. Darrick Penna  . Anterior wall myocardial infarction (HCC) 10/08/2015   Presented with severe chest pain and dynamic anterior ST elevations/biphasic elevations area did not meet full criteria for STEMI, and troponin was not dramatically elevated. 95% LAD treated with PTCA followed by CABG  . Anxiety   . Atrial fibrillation, new onset (HCC) 10/2015   after CABG, in SR at d/c  . CAD (coronary artery disease), native coronary artery 10/08/2015   Severe coronary disease with calcified left main and LAD: 5% left main going into ostial LAD. Mid LAD 95% (treated with PTCA). Proximal RCA 50%, calcified. EF was 20-25% with apical akinesis and distal anterior hypokinesis. -->  Referred for CABG  . CAP (community acquired pneumonia) 07/2013   Hattie Perch 07/09/2013  . Congestive dilated cardiomyopathy (HCC) 10/08/2015   Intra-Op TEE showed EF of 40 and 45%. She had an echo with a "normal LV function "documented, but there was no reading M.D.  . COPD (chronic obstructive pulmonary disease) (HCC)   . Depression   . Headache    "weekly" (12/31/2016)  . Hyperlipidemia LDL goal <70 10/08/2015  . IBS (irritable bowel syndrome)   . Migraine    "none in years" (12/31/2016)  . Oral thrush   . Post PTCA 10/08/2015   Presented with STEMI, severe mid LAD lesion treated with PTCA and then referred for CABG.  . Syncope   . TIA (transient ischemic attack)       Past Surgical History:  Procedure Laterality Date  . ABDOMINAL HYSTERECTOMY    . BACK SURGERY    . CARDIAC CATHETERIZATION N/A 10/06/2015   Procedure: Left Heart Cath and Coronary Angiography;  Surgeon: Marykay Lex, MD;  Location: [facility]  INVASIVE CV LAB;  Service: Cardiovascular;: Heavily calcified left main-LAD. 75% LM & Ost LAD, 95% d-mLAD, p-mRCA Calcified 50%. EF ~20-25%  . CARDIAC CATHETERIZATION N/A 10/06/2015   Procedure: Coronary Balloon Angioplasty;  Surgeon: Marykay Lex, MD;  Location: [facility] INVASIVE CV LAB;  Service: Cardiovascular: PTCA of 95% LAD in setting of STEMI - reduced to 70% --> would need Atherectomy-PCI vs. CABG.  Sent for CABG.  . COLECTOMY    . CORONARY ARTERY BYPASS GRAFT N/A 10/16/2015   Procedure: CORONARY ARTERY BYPASS GRAFTING (CABG)  x three, using left internal mammary artery and right leg greater saphenous vein harvested endoscopically;  Surgeon: Loreli Slot, MD;  Location: [facility] OR;  Service: Open Heart Surgery;  Laterality: N/A;  . FRACTURE SURGERY    . HIP PINNING,CANNULATED Left 09/24/2016   Procedure: CANNULATED HIP PINNING LEFT HIP;  Surgeon: Durene Romans, MD;  Location: [facility] ORS;  Service: Orthopedics;  Laterality: Left;  . LUMBAR DISC SURGERY     L3,4,5; S1"  . NECK SURGERY    . TEE WITHOUT CARDIOVERSION N/A 10/16/2015   Procedure: TRANSESOPHAGEAL ECHOCARDIOGRAM (TEE);  Surgeon: Loreli Slot, MD;  Location: [facility] OR;  Service: Open Heart Surgery - IntraOp:  EF 40-45%.  . TONSILLECTOMY    . TOTAL HIP ARTHROPLASTY Right       Social History:     Social History   Tobacco Use  . Smoking status: Former Smoker    Packs/day: 0.50    Years: 50.00    Pack years: 25.00    Types: Cigarettes    Last attempt to quit: 04/11/2015    Years since quitting: 3.0  . Smokeless tobacco: Never Used  Substance Use Topics  . Alcohol use: Yes    Comment: 12/31/2016 "glass of wine maybe once/month"     Lives - at SNF  Mobility - with assistance     Family History :     Family History  Problem Relation Age of Onset  . Stroke Mother        dead  . Clotting disorder Father        dead  bone marrow disease      Home Medications:   Prior to Admission medications   Medication Sig  Start Date End Date Taking? Authorizing Provider  acetaminophen (TYLENOL) 325 MG tablet Take 650 mg by mouth daily.   Yes [provider]  acetaminophen (TYLENOL) 500 MG tablet Take 1,000 mg by  mouth every 8 (eight) hours as needed for mild pain.   Yes [provider]  ARIPiprazole (ABILIFY) 2 MG tablet Take 2 mg by mouth daily.   Yes [provider]  aspirin EC 81 MG tablet Take 81 mg by mouth every evening.    Yes [provider]  clobetasol (TEMOVATE) 0.05 % external solution Apply 1 application topically 2 (two) times daily as needed for rash. Apply to scalp 02/17/18  Yes [provider]  divalproex (DEPAKOTE) 250 MG DR tablet Take 250 mg by mouth daily.   Yes [provider]  divalproex (DEPAKOTE) 500 MG DR tablet Take 500 mg by mouth at bedtime.   Yes [provider]  docusate sodium (COLACE) 100 MG capsule Take 1 capsule (100 mg total) by mouth 2 (two) times daily. 09/24/16  Yes Babish, Molli HazardMatthew, PA-C  fluticasone (FLONASE) 50 MCG/ACT nasal spray Place 1 spray into both nostrils daily. Patient taking differently: Place 1 spray into both nostrils at bedtime.  11/16/16  Yes Vassie LollMadera, Carlos, MD  Glucosamine Sulfate 500 MG CAPS Take 1,500 mg by mouth daily.   Yes [provider]  meclizine (ANTIVERT) 12.5 MG tablet Take 12.5 mg by mouth 3 (three) times daily.   Yes [provider]  meclizine (ANTIVERT) 12.5 MG tablet Take 12.5 mg by mouth 2 (two) times daily as needed for dizziness.   Yes [provider]  Melatonin 5 MG CAPS Take 5 mg by mouth at bedtime.   Yes [provider]  methocarbamol (ROBAXIN) 500 MG tablet Take 1 tablet (500 mg total) by mouth every 8 (eight) hours as needed for muscle spasms. 01/01/17  Yes Maxie BarbBhandari, Dron Prasad, MD  Multiple Vitamin (MULTIVITAMIN WITH MINERALS) TABS tablet Take 1 tablet by mouth every evening.   Yes [provider]  OLANZapine (ZYPREXA) 2.5 MG tablet  Take 1 tablet (2.5 mg total) by mouth every evening. Patient taking differently: Take 2.5 mg by mouth at bedtime.  02/14/17  Yes Rolly SalterPatel, Pranav M, MD  pilocarpine (SALAGEN) 5 MG tablet Take 5 mg by mouth 3 (three) times daily.   Yes [provider]  polyethylene glycol (MIRALAX / GLYCOLAX) packet Take 17 g by mouth daily as needed. Patient taking differently: Take 17 g by mouth daily as needed for mild constipation. Mix in 4-8 oz liquid and drink 01/01/17  Yes Maxie BarbBhandari, Dron Prasad, MD  senna (SENOKOT) 8.6 MG TABS tablet Take 1 tablet (8.6 mg total) by mouth daily. Patient taking differently: Take 1 tablet by mouth 2 (two) times daily as needed for mild constipation.  02/27/17  Yes Regalado, Belkys A, MD  traZODone (DESYREL) 50 MG tablet Take 25 mg by mouth at bedtime.   Yes [provider]  traZODone (DESYREL) 50 MG tablet Take 12.5 mg by mouth 2 (two) times daily.   Yes [provider]  atorvastatin (LIPITOR) 40 MG tablet Take 1 tablet (40 mg total) by mouth daily at 6 PM. Patient not taking: Reported on 05/07/2018 12/18/15   Barrett, Rae RoamErin R, PA-C  fludrocortisone (FLORINEF) 0.1 MG tablet Take 2 tablets (0.2 mg total) by mouth daily. Patient not taking: Reported on 03/20/2017 02/15/17   Rolly SalterPatel, Pranav M, MD  sertraline (ZOLOFT) 50 MG tablet Take 0.5 tablets (25 mg total) by mouth every morning. Patient not taking: Reported on 05/07/2018 11/16/16   Vassie LollMadera, Carlos, MD  traMADol (ULTRAM) 50 MG tablet Take 1 tablet (50 mg total) every 6 (six) hours as needed by mouth. Patient not  taking: Reported on 05/07/2018 03/20/17   Loren Racer, MD     Allergies:    No Known Allergies   Physical Exam:   Vitals  Blood pressure 108/62, pulse 81, temperature 97.6 F (36.4 C), temperature source Oral, resp. rate 19, SpO2 100 %.   1. General frail elderly female laying in bed in no apparent distress   2.  Patient awake alert x2, easily distracted   3. No F.N deficits, ALL  C.Nerves Intact, Strength 5/5 all 4 extremities, Sensation intact all 4 extremities, Plantars down going.  4. Ears and Eyes appear Normal, Conjunctivae clear, PERRLA.  Dry oral mucosa  5. Supple Neck, No JVD, No cervical lymphadenopathy appriciated, No Carotid Bruits.  6. Symmetrical Chest wall movement, Good air movement bilaterally, CTAB.  7. RRR, No Gallops, Rubs or Murmurs, No Parasternal Heave.  8. Positive Bowel Sounds, Abdomen Soft, No tenderness, No organomegaly appriciated,No rebound -guarding or rigidity.  9.  No Cyanosis, delayed skin Turgor, No Skin Rash or Bruise.  10. Good muscle tone,  joints appear normal , no effusions, Normal ROM.  11. No Palpable Lymph Nodes in Neck or Axillae    Data Review:    CBC Recent Labs  Lab 05/07/18 1245  WBC 7.8  HGB 11.8*  HCT 37.5  PLT 164  MCV 96.2  MCH 30.3  MCHC 31.5  RDW 13.2   ------------------------------------------------------------------------------------------------------------------  Chemistries  Recent Labs  Lab 05/07/18 1245  NA 137  K 4.7  CL 104  CO2 23  GLUCOSE 93  BUN 25*  CREATININE 1.41*  CALCIUM 9.2   ------------------------------------------------------------------------------------------------------------------ CrCl cannot be calculated (Unknown ideal weight.). ------------------------------------------------------------------------------------------------------------------ No results for input(s): TSH, T4TOTAL, T3FREE, THYROIDAB in the last 72 hours.  Invalid input(s): FREET3  Coagulation profile No results for input(s): INR, PROTIME in the last 168 hours. ------------------------------------------------------------------------------------------------------------------- No results for input(s): DDIMER in the last 72 hours. -------------------------------------------------------------------------------------------------------------------  Cardiac Enzymes No results for input(s):  CKMB, TROPONINI, MYOGLOBIN in the last 168 hours.  Invalid input(s): CK ------------------------------------------------------------------------------------------------------------------ No results found for: BNP   ---------------------------------------------------------------------------------------------------------------  Urinalysis    Component Value Date/Time   COLORURINE YELLOW 05/07/2018 1357   APPEARANCEUR HAZY (A) 05/07/2018 1357   LABSPEC 1.020 05/07/2018 1357   PHURINE 5.0 05/07/2018 1357   GLUCOSEU NEGATIVE 05/07/2018 1357   HGBUR NEGATIVE 05/07/2018 1357   BILIRUBINUR NEGATIVE 05/07/2018 1357   KETONESUR 5 (A) 05/07/2018 1357   PROTEINUR NEGATIVE 05/07/2018 1357   UROBILINOGEN 0.2 02/25/2015 1826   NITRITE NEGATIVE 05/07/2018 1357   LEUKOCYTESUR NEGATIVE 05/07/2018 1357    ----------------------------------------------------------------------------------------------------------------   Imaging Results:    Dg Chest 2 View  Result Date: 05/07/2018 CLINICAL DATA:  Syncope. EXAM: CHEST - 2 VIEW COMPARISON:  Radiographs of March 20, 2017. FINDINGS: The heart size and mediastinal contours are within normal limits. No pneumothorax or pleural effusion is noted. Atherosclerosis of thoracic aorta is noted. Status post coronary bypass graft. Stable bibasilar scarring is noted. No acute pulmonary disease is noted. Stable old lower thoracic vertebral body compression fracture. IMPRESSION: No active cardiopulmonary disease. Aortic Atherosclerosis (ICD10-I70.0). Electronically Signed   By: Lupita Raider, M.D.   On: 05/07/2018 12:18   Ct Head Wo Contrast  Result Date: 05/07/2018 CLINICAL DATA:  C-spine trauma, high clinical risk. Patient at skilled nursing facility with syncopal episode. History of dementia. EXAM: CT HEAD WITHOUT CONTRAST CT CERVICAL SPINE WITHOUT CONTRAST TECHNIQUE: Multidetector CT imaging of the head and cervical spine was performed following the standard  protocol without  intravenous contrast. Multiplanar CT image reconstructions of the cervical spine were also generated. COMPARISON:  03/20/2017. FINDINGS: CT HEAD FINDINGS Brain: No evidence of acute stroke, acute hemorrhage, mass lesion, or extra-axial fluid. Generalized atrophy. Extensive hypoattenuation of white matter, representing small vessel disease. Old LEFT basal ganglia infarct. Old RIGHT occipital infarct. Vascular: Calcification of the cavernous internal carotid arteries consistent with cerebrovascular atherosclerotic disease. No signs of intracranial large vessel occlusion. Skull: Calvarium intact. No fracture. Sinuses/Orbits: Unremarkable. Other: None. CT CERVICAL SPINE FINDINGS Alignment: Previous C5-C7 ACDF. Trace anterolisthesis at C4-5 and C7-T1, possibly adjacent segment disease. Skull base and vertebrae: No fracture. Pseudarthrosis C6-C7. Soft tissues and spinal canal: No prevertebral fluid or swelling. No visible canal hematoma. Disc levels: No canal hematoma. Suspected calcified protrusion C4-C5. Multilevel facet arthropathy. Upper chest: Emphysematous change. No upper rib fracture or pneumothorax. Biapical pleural thickening. Atherosclerosis. CABG. Other: None IMPRESSION: 1. Atrophy and small vessel disease. Remote infarcts. No skull fracture or intracranial hemorrhage. 2. No cervical spine fracture or traumatic subluxation. 3. Previous C5-C7 ACDF.  Chronic Pseudarthrosis C6-C7. Emphysema (ICD10-J43.9). Electronically Signed   By: Elsie StainJohn T Curnes M.D.   On: 05/07/2018 14:51   Ct Cervical Spine Wo Contrast  Result Date: 05/07/2018 CLINICAL DATA:  C-spine trauma, high clinical risk. Patient at skilled nursing facility with syncopal episode. History of dementia. EXAM: CT HEAD WITHOUT CONTRAST CT CERVICAL SPINE WITHOUT CONTRAST TECHNIQUE: Multidetector CT imaging of the head and cervical spine was performed following the standard protocol without intravenous contrast. Multiplanar CT image  reconstructions of the cervical spine were also generated. COMPARISON:  03/20/2017. FINDINGS: CT HEAD FINDINGS Brain: No evidence of acute stroke, acute hemorrhage, mass lesion, or extra-axial fluid. Generalized atrophy. Extensive hypoattenuation of white matter, representing small vessel disease. Old LEFT basal ganglia infarct. Old RIGHT occipital infarct. Vascular: Calcification of the cavernous internal carotid arteries consistent with cerebrovascular atherosclerotic disease. No signs of intracranial large vessel occlusion. Skull: Calvarium intact. No fracture. Sinuses/Orbits: Unremarkable. Other: None. CT CERVICAL SPINE FINDINGS Alignment: Previous C5-C7 ACDF. Trace anterolisthesis at C4-5 and C7-T1, possibly adjacent segment disease. Skull base and vertebrae: No fracture. Pseudarthrosis C6-C7. Soft tissues and spinal canal: No prevertebral fluid or swelling. No visible canal hematoma. Disc levels: No canal hematoma. Suspected calcified protrusion C4-C5. Multilevel facet arthropathy. Upper chest: Emphysematous change. No upper rib fracture or pneumothorax. Biapical pleural thickening. Atherosclerosis. CABG. Other: None IMPRESSION: 1. Atrophy and small vessel disease. Remote infarcts. No skull fracture or intracranial hemorrhage. 2. No cervical spine fracture or traumatic subluxation. 3. Previous C5-C7 ACDF.  Chronic Pseudarthrosis C6-C7. Emphysema (ICD10-J43.9). Electronically Signed   By: Elsie StainJohn T Curnes M.D.   On: 05/07/2018 14:51   Ct Angio Chest/abd/pel For Dissection W And/or W/wo  Result Date: 05/07/2018 CLINICAL DATA:  84 y/o F; syncopal episode. History of abdominal aortic aneurysm. EXAM: CT ANGIOGRAPHY CHEST, ABDOMEN AND PELVIS TECHNIQUE: Multidetector CT imaging through the chest, abdomen and pelvis was performed using the standard protocol during bolus administration of intravenous contrast. Multiplanar reconstructed images and MIPs were obtained and reviewed to evaluate the vascular anatomy.  CONTRAST:  100mL ISOVUE-370 IOPAMIDOL (ISOVUE-370) INJECTION 76% COMPARISON:  02/11/2017 CT chest. 01/29/2017 CT abdomen and pelvis. FINDINGS: CTA CHEST FINDINGS Cardiovascular: Mild cardiomegaly. Normal caliber thoracic aorta and main pulmonary artery. Severe mixed plaque of the aorta and severe calcification of coronary arteries. Status post CABG with saphenous grafts and LIMA. Descending thoracic aorta ectasia to 3.5 cm. Mediastinum/Nodes: No enlarged mediastinal, hilar, or axillary lymph nodes. Thyroid gland, trachea, and esophagus demonstrate  no significant findings. Lungs/Pleura: Right lower lobe spiculated nodule measuring 19 x 16 mm (series 9, image 109) increased in size from prior studies. Multiple additional stable ground-glass and solid subcentimeter peripheral pulmonary nodules, fibrotic changes, right apical scarring, and emphysema. No pleural effusion or pneumothorax. Musculoskeletal: Pectus deformity, Haller index 2.9. healed median sternotomy. Stable T9 moderate compression deformity Review of the MIP images confirms the above findings. CTA ABDOMEN AND PELVIS FINDINGS VASCULAR Aorta: Severe mixed plaque of the aorta. Infrarenal abdominal aortic aneurysm measuring 5.9 x 6.8 cm (AP by ML series 11, image 85 and series 10, image 39). Celiac: Calcified plaque of celiac origin with mild 50% stenosis. SMA: Mixed plaque of proximal SMA with mild 50% stenosis. Renals: Mixed plaque of bilateral proximal renal arteries with severe stenosis, greater on the left. IMA: Not identified. Inflow: Severe calcific atherosclerosis with multiple segments of mild stenosis. Veins: No obvious venous abnormality within the limitations of this arterial phase study. Review of the MIP images confirms the above findings. NON-VASCULAR Hepatobiliary: No focal liver abnormality is seen. No gallstones, gallbladder wall thickening, or biliary dilatation. Pancreas: Unremarkable. No pancreatic ductal dilatation or surrounding  inflammatory changes. Spleen: Normal in size without focal abnormality. Adrenals/Urinary Tract: Adrenal glands are unremarkable. Left kidney atrophy. Right kidney lower pole subcentimeter simple cyst in left kidney interpole subcentimeter hemorrhagic cyst. No urinary stone disease or hydronephrosis. Normal bladder. Stomach/Bowel: Stomach is within normal limits. Appendix not identified, no pericecal inflammation. No evidence of bowel wall thickening, distention, or inflammatory changes. Lymphatic: Aortic atherosclerosis. No enlarged abdominal or pelvic lymph nodes. Reproductive: Status post hysterectomy. No adnexal masses. Other: No abdominal wall hernia or abnormality. No abdominopelvic ascites. Musculoskeletal: No fracture is seen. Review of the MIP images confirms the above findings. IMPRESSION: 1. No acute process identified. 2. Infrarenal abdominal aortic aneurysm measures 6.8 cm, previously 5.8 cm. Recommend followup by abdomen and pelvis CTA in 3-6 months, and vascular surgery referral/consultation if not already obtained. This recommendation follows ACR consensus guidelines: White Paper of the ACR Incidental Findings Committee II on Vascular Findings. J Am Coll Radiol 2013; 10:789-794. 3. Right lower lobe 19 mm spiculated nodule, increased in size from 10/18. Pulmonary malignancy is suspected. Consider PET-CT and/or tissue sampling. 4. Stable chronic moderate T9 compression deformity. 5. Severe aortic and branch vessel atherosclerosis. Status post CABG. 6. Aortic Atherosclerosis (ICD10-I70.0) and Emphysema (ICD10-J43.9). Electronically Signed   By: Mitzi Hansen M.D.   On: 05/07/2018 14:59    My personal review of EKG: Rhythm NSR, Rate  88 /min, QTc 444 , no Acute ST changes   Assessment & Plan:    Active Problems:   AAA (abdominal aortic aneurysm) (HCC)   Acute renal failure superimposed on stage 3 chronic kidney disease (HCC)   Physical deconditioning   CAD (coronary artery disease)    Orthostatic hypotension   Syncope   Syncope secondary to orthostasis -Likely in the setting of orthostatic hypotension, patient had history of this in the past, denies any chest pain, shortness of breath, he appears to be clinically dehydrated, on IV fluids, and telemetry. Need for echo as it is clearly due to orthostasis -We will start on TED hose -I will hold her Pilocarpine (as it may produce hypotension)  AAA -Aggressive in size, it is 6.8 cm currently, discussed with son and patient, they do not wish for any further work-up.  Lung Nodules -Has spiculated lung nodules, 2 cm, has grown in size since most recent CT, have discussed with son, at this  point he did not wish for any further work-up regardless to finding they do not want to subject her to surgery or radiation or chemo.  Deconditioning/failure to thrive -We will consult PT  CAD -Treated with aspirin and statin, certainly no beta-blockers or ACE in the setting of hypotension  Hyperlipidemia -Continue with statin  Discussed plan with the son, he does not wish for any aggressive work-up, she will be admitted overnight for observation, for gentle hydration, and she will be discharged back to SNF tomorrow.   DVT Prophylaxis Heparin -  - SCDs   AM Labs Ordered, also please review Full Orders  Family Communication: Admission, patients condition and plan of care including tests being ordered have been discussed with the patient and son via phone who indicate understanding and agree with the plan and Code Status.  Code Status DNR  Likely DC to  Back to SNF  Condition GUARDED    Consults called: none  Admission status: Observation   Time spent in minutes : 55 miutes   Huey Bienenstock M.D on 05/07/2018 at 5:01 PM  Between 7am to 7pm - Pager - [phone]. After 7pm go to [email or web address] - password [facility]  [facility] - Office  [phone]

## 2018-05-07 NOTE — ED Notes (Signed)
Patient transported to CT 

## 2018-05-07 NOTE — ED Notes (Signed)
Pt taken to scans, RN/phlebotomy to collect labs upon pt return to room

## 2018-05-07 NOTE — ED Notes (Signed)
Glucose 93

## 2018-05-07 NOTE — ED Provider Notes (Signed)
[facility] EMERGENCY DEPARTMENT Provider Note   CSN: [id number] Arrival date & time: 05/07/18  1043     History   Chief Complaint Chief Complaint  Patient presents with  . Loss of Consciousness    HPI Tonya Payne is a 84 y.o. female with a past medical history of CAD, prior MI, COPD, who presents to ED for syncopal episode that occurred prior to arrival.  Patient has a history of dementia so history is limited.  Per EMS, patient is from [facility].  She had a syncopal episode that was witnessed and lasted for less than 5 minutes per staff.  Patient states that she was sitting in a chair and then apparently lost consciousness.  States that they gave her oxygen and that caused her to regain consciousness.  Reports history of similar symptoms in the past but denies any head injuries.  Initial BP was 70/50.  She was orthostatic positive for EMS.  Patient reports feeling shaky, has a headache, blurry vision, neck pain, back pain, generalized malaise.  She endorses decreased appetite since she woke up this morning and did not eat much breakfast.  She denies any abdominal pain, chest pain, shortness of breath, vomiting.  HPI  Past Medical History:  Diagnosis Date  . AAA (abdominal aortic aneurysm) (HCC)   . AAA (abdominal aortic aneurysm) without rupture (HCC) 2016   CTA 10/23/2015 - saccular infrarenal aneurysm roughly 4.8 cm in diameter. Stable from 2016 -- Dr. Darrick Penna  . Anterior wall myocardial infarction (HCC) 10/08/2015   Presented with severe chest pain and dynamic anterior ST elevations/biphasic elevations area did not meet full criteria for STEMI, and troponin was not dramatically elevated. 95% LAD treated with PTCA followed by CABG  . Anxiety   . Atrial fibrillation, new onset (HCC) 10/2015   after CABG, in SR at d/c  . CAD (coronary artery disease), native coronary artery 10/08/2015   Severe coronary disease with calcified left main and LAD: 5% left main going  into ostial LAD. Mid LAD 95% (treated with PTCA). Proximal RCA 50%, calcified. EF was 20-25% with apical akinesis and distal anterior hypokinesis. --> Referred for CABG  . CAP (community acquired pneumonia) 07/2013   Hattie Perch 07/09/2013  . Congestive dilated cardiomyopathy (HCC) 10/08/2015   Intra-Op TEE showed EF of 40 and 45%. She had an echo with a "normal LV function "documented, but there was no reading M.D.  . COPD (chronic obstructive pulmonary disease) (HCC)   . Depression   . Headache    "weekly" (12/31/2016)  . Hyperlipidemia LDL goal <70 10/08/2015  . IBS (irritable bowel syndrome)   . Migraine    "none in years" (12/31/2016)  . Oral thrush   . Post PTCA 10/08/2015   Presented with STEMI, severe mid LAD lesion treated with PTCA and then referred for CABG.  . Syncope   . TIA (transient ischemic attack)     Patient Active Problem List   Diagnosis Date Noted  . Syncope due to autonomic failure 02/25/2017  . Hyperglycemia 02/24/2017  . Diarrhea 02/24/2017  . Compression fx, lumbar spine (HCC) 02/24/2017  . Constipation 02/02/2017  . Hypomagnesemia 01/31/2017  . Orthostatic hypotension 01/31/2017  . Syncope and collapse 01/29/2017  . Nausea vomiting and diarrhea 01/29/2017  . Hypertensive urgency 01/29/2017  . COPD (chronic obstructive pulmonary disease) (HCC) 12/30/2016  . Closed compression fracture of thoracic vertebra (HCC) 12/30/2016  . Abdominal pain 12/30/2016  . Alcohol use 12/30/2016  . CAD (coronary artery disease)  12/30/2016  . Scalp laceration   . Acute renal failure superimposed on stage 3 chronic kidney disease (HCC)   . Dehydration   . Physical deconditioning   . Pressure injury of skin 11/12/2016  . CKD (chronic kidney disease), stage III (HCC) 09/24/2016  . Closed left hip fracture, with routine healing, subsequent encounter 09/23/2016  . Hyponatremia 09/23/2016  . Hypertension 09/23/2016  . Left displaced femoral neck fracture (HCC)   . Closed fracture  of multiple pubic rami, right, sequela 02/27/2016  . Displaced fracture of neck of right second metacarpal bone with routine healing 02/27/2016  . Protein-calorie malnutrition, severe (HCC) 02/22/2016  . Orthostatic dizziness 02/22/2016  . Postoperative atrial fibrillation (HCC) 02/22/2016  . Fall   . Hand fracture   . Pelvic fracture (HCC) 01/26/2016  . AAA (abdominal aortic aneurysm) (HCC) 11/30/2015  . Post PTCA   . S/P CABG x 3 10/16/2015  . Coronary artery disease involving native coronary artery with angina pectoris (HCC) 10/08/2015  . Congestive dilated cardiomyopathy (HCC) 10/08/2015  . Hyperlipidemia LDL goal <70 10/08/2015  . E. coli UTI (urinary tract infection) 10/06/2015  . Myocardial infarction involving left anterior descending (LAD) coronary artery (HCC) 10/06/2015    Past Surgical History:  Procedure Laterality Date  . ABDOMINAL HYSTERECTOMY    . BACK SURGERY    . CARDIAC CATHETERIZATION N/A 10/06/2015   Procedure: Left Heart Cath and Coronary Angiography;  Surgeon: Marykay Lex, MD;  Location: [facility] INVASIVE CV LAB;  Service: Cardiovascular;: Heavily calcified left main-LAD. 75% LM & Ost LAD, 95% d-mLAD, p-mRCA Calcified 50%. EF ~20-25%  . CARDIAC CATHETERIZATION N/A 10/06/2015   Procedure: Coronary Balloon Angioplasty;  Surgeon: Marykay Lex, MD;  Location: [facility] INVASIVE CV LAB;  Service: Cardiovascular: PTCA of 95% LAD in setting of STEMI - reduced to 70% --> would need Atherectomy-PCI vs. CABG.  Sent for CABG.  . COLECTOMY    . CORONARY ARTERY BYPASS GRAFT N/A 10/16/2015   Procedure: CORONARY ARTERY BYPASS GRAFTING (CABG)  x three, using left internal mammary artery and right leg greater saphenous vein harvested endoscopically;  Surgeon: Loreli Slot, MD;  Location: [facility] OR;  Service: Open Heart Surgery;  Laterality: N/A;  . FRACTURE SURGERY    . HIP PINNING,CANNULATED Left 09/24/2016   Procedure: CANNULATED HIP PINNING LEFT HIP;  Surgeon: Durene Romans, MD;   Location: [facility] ORS;  Service: Orthopedics;  Laterality: Left;  . LUMBAR DISC SURGERY     L3,4,5; S1"  . NECK SURGERY    . TEE WITHOUT CARDIOVERSION N/A 10/16/2015   Procedure: TRANSESOPHAGEAL ECHOCARDIOGRAM (TEE);  Surgeon: Loreli Slot, MD;  Location: [facility] OR;  Service: Open Heart Surgery - IntraOp:  EF 40-45%.  . TONSILLECTOMY    . TOTAL HIP ARTHROPLASTY Right      OB History   No obstetric history on file.      Home Medications    Prior to Admission medications   Medication Sig Start Date End Date Taking? Authorizing Provider  acetaminophen (TYLENOL) 325 MG tablet Take 650 mg by mouth daily.   Yes [provider]  acetaminophen (TYLENOL) 500 MG tablet Take 1,000 mg by mouth every 8 (eight) hours as needed for mild pain.   Yes [provider]  ARIPiprazole (ABILIFY) 2 MG tablet Take 2 mg by mouth daily.   Yes [provider]  aspirin EC 81 MG tablet Take 81 mg by mouth every evening.    Yes [provider]  clobetasol (TEMOVATE) 0.05 %  external solution Apply 1 application topically 2 (two) times daily as needed for rash. Apply to scalp 02/17/18  Yes [provider]  divalproex (DEPAKOTE) 250 MG DR tablet Take 250 mg by mouth daily.   Yes [provider]  divalproex (DEPAKOTE) 500 MG DR tablet Take 500 mg by mouth at bedtime.   Yes [provider]  docusate sodium (COLACE) 100 MG capsule Take 1 capsule (100 mg total) by mouth 2 (two) times daily. 09/24/16  Yes Babish, Molli Hazard, PA-C  fluticasone (FLONASE) 50 MCG/ACT nasal spray Place 1 spray into both nostrils daily. Patient taking differently: Place 1 spray into both nostrils at bedtime.  11/16/16  Yes Vassie Loll, MD  Glucosamine Sulfate 500 MG CAPS Take 1,500 mg by mouth daily.   Yes [provider]  meclizine (ANTIVERT) 12.5 MG tablet Take 12.5 mg by mouth 3 (three) times daily.   Yes [provider]  meclizine (ANTIVERT) 12.5 MG tablet Take 12.5  mg by mouth 2 (two) times daily as needed for dizziness.   Yes [provider]  Melatonin 5 MG CAPS Take 5 mg by mouth at bedtime.   Yes [provider]  methocarbamol (ROBAXIN) 500 MG tablet Take 1 tablet (500 mg total) by mouth every 8 (eight) hours as needed for muscle spasms. 01/01/17  Yes Maxie Barb, MD  Multiple Vitamin (MULTIVITAMIN WITH MINERALS) TABS tablet Take 1 tablet by mouth every evening.   Yes [provider]  OLANZapine (ZYPREXA) 2.5 MG tablet Take 1 tablet (2.5 mg total) by mouth every evening. Patient taking differently: Take 2.5 mg by mouth at bedtime.  02/14/17  Yes Rolly Salter, MD  pilocarpine (SALAGEN) 5 MG tablet Take 5 mg by mouth 3 (three) times daily.   Yes [provider]  polyethylene glycol (MIRALAX / GLYCOLAX) packet Take 17 g by mouth daily as needed. Patient taking differently: Take 17 g by mouth daily as needed for mild constipation. Mix in 4-8 oz liquid and drink 01/01/17  Yes Maxie Barb, MD  senna (SENOKOT) 8.6 MG TABS tablet Take 1 tablet (8.6 mg total) by mouth daily. Patient taking differently: Take 1 tablet by mouth 2 (two) times daily as needed for mild constipation.  02/27/17  Yes Regalado, Belkys A, MD  traZODone (DESYREL) 50 MG tablet Take 25 mg by mouth at bedtime.   Yes [provider]  traZODone (DESYREL) 50 MG tablet Take 12.5 mg by mouth 2 (two) times daily.   Yes [provider]  atorvastatin (LIPITOR) 40 MG tablet Take 1 tablet (40 mg total) by mouth daily at 6 PM. Patient not taking: Reported on 05/07/2018 12/18/15   Barrett, Rae Roam, PA-C  fludrocortisone (FLORINEF) 0.1 MG tablet Take 2 tablets (0.2 mg total) by mouth daily. Patient not taking: Reported on 03/20/2017 02/15/17   Rolly Salter, MD  sertraline (ZOLOFT) 50 MG tablet Take 0.5 tablets (25 mg total) by mouth every morning. Patient not taking: Reported on 05/07/2018 11/16/16   Vassie Loll, MD  traMADol (ULTRAM)  50 MG tablet Take 1 tablet (50 mg total) every 6 (six) hours as needed by mouth. Patient not taking: Reported on 05/07/2018 03/20/17   Loren Racer, MD    Family History Family History  Problem Relation Age of Onset  . Stroke Mother        dead  . Clotting disorder Father        dead  bone marrow disease    Social History Social  History   Tobacco Use  . Smoking status: Former Smoker    Packs/day: 0.50    Years: 50.00    Pack years: 25.00    Types: Cigarettes    Last attempt to quit: 04/11/2015    Years since quitting: 3.0  . Smokeless tobacco: Never Used  Substance Use Topics  . Alcohol use: Yes    Comment: 12/31/2016 "glass of wine maybe once/month"  . Drug use: No     Allergies   Patient has no known allergies.   Review of Systems Review of Systems  Unable to perform ROS: Dementia  Constitutional: Positive for appetite change and fatigue.  Eyes: Positive for visual disturbance.  Musculoskeletal: Positive for back pain, myalgias and neck pain.  Neurological: Positive for headaches.     Physical Exam Updated Vital Signs BP 103/78   Pulse 79   Temp 97.6 F (36.4 C) (Oral)   Resp (!) 22   SpO2 100%   Physical Exam Vitals signs and nursing note reviewed.  Constitutional:      General: She is not in acute distress.    Appearance: She is well-developed.  HENT:     Head: Normocephalic and atraumatic.     Nose: Nose normal.  Eyes:     General: No scleral icterus.       Left eye: No discharge.     Conjunctiva/sclera: Conjunctivae normal.  Neck:     Musculoskeletal: Normal range of motion and neck supple.  Cardiovascular:     Rate and Rhythm: Normal rate and regular rhythm.     Heart sounds: Normal heart sounds. No murmur. No friction rub. No gallop.   Pulmonary:     Effort: Pulmonary effort is normal. No respiratory distress.     Breath sounds: Normal breath sounds.  Abdominal:     General: Bowel sounds are normal. There is no distension.      Palpations: Abdomen is soft.     Tenderness: There is no abdominal tenderness. There is no guarding.  Musculoskeletal: Normal range of motion.  Skin:    General: Skin is warm and dry.     Findings: No rash.  Neurological:     General: No focal deficit present.     Mental Status: She is alert and oriented to person, place, and time.     Cranial Nerves: No cranial nerve deficit.     Sensory: No sensory deficit.     Motor: No weakness or abnormal muscle tone.     Coordination: Coordination normal.     Comments: Alert to self, place, time and situation. Pupils reactive. No facial asymmetry noted. Cranial nerves appear grossly intact. Sensation intact to light touch on face, BUE and BLE. Strength 5/5 in BUE and BLE.       ED Treatments / Results  Labs (all labs ordered are listed, but only abnormal results are displayed) Labs Reviewed  BASIC METABOLIC PANEL - Abnormal; Notable for the following components:      Result Value   BUN 25 (*)    Creatinine, Ser 1.41 (*)    GFR calc non Af Amer 35 (*)    GFR calc Af Amer 40 (*)    All other components within normal limits  CBC - Abnormal; Notable for the following components:   Hemoglobin 11.8 (*)    All other components within normal limits  URINALYSIS, ROUTINE W REFLEX MICROSCOPIC - Abnormal; Notable for the following components:   APPearance HAZY (*)    Ketones, ur  5 (*)    All other components within normal limits  URINE CULTURE  CBG MONITORING, ED    EKG EKG Interpretation  Date/Time:  Thursday May 07 2018 10:45:51 EST Ventricular Rate:  88 PR Interval:    QRS Duration: 67 QT Interval:  367 QTC Calculation: 444 R Axis:   7 Text Interpretation:  Sinus rhythm No significant change was found Confirmed by Azalia Bilisampos, Kevin ([id number]) on 05/07/2018 12:44:44 PM   Radiology Dg Chest 2 View  Result Date: 05/07/2018 CLINICAL DATA:  Syncope. EXAM: CHEST - 2 VIEW COMPARISON:  Radiographs of March 20, 2017. FINDINGS: The heart size  and mediastinal contours are within normal limits. No pneumothorax or pleural effusion is noted. Atherosclerosis of thoracic aorta is noted. Status post coronary bypass graft. Stable bibasilar scarring is noted. No acute pulmonary disease is noted. Stable old lower thoracic vertebral body compression fracture. IMPRESSION: No active cardiopulmonary disease. Aortic Atherosclerosis (ICD10-I70.0). Electronically Signed   By: Lupita RaiderJames  Green Jr, M.D.   On: 05/07/2018 12:18   Ct Head Wo Contrast  Result Date: 05/07/2018 CLINICAL DATA:  C-spine trauma, high clinical risk. Patient at skilled nursing facility with syncopal episode. History of dementia. EXAM: CT HEAD WITHOUT CONTRAST CT CERVICAL SPINE WITHOUT CONTRAST TECHNIQUE: Multidetector CT imaging of the head and cervical spine was performed following the standard protocol without intravenous contrast. Multiplanar CT image reconstructions of the cervical spine were also generated. COMPARISON:  03/20/2017. FINDINGS: CT HEAD FINDINGS Brain: No evidence of acute stroke, acute hemorrhage, mass lesion, or extra-axial fluid. Generalized atrophy. Extensive hypoattenuation of white matter, representing small vessel disease. Old LEFT basal ganglia infarct. Old RIGHT occipital infarct. Vascular: Calcification of the cavernous internal carotid arteries consistent with cerebrovascular atherosclerotic disease. No signs of intracranial large vessel occlusion. Skull: Calvarium intact. No fracture. Sinuses/Orbits: Unremarkable. Other: None. CT CERVICAL SPINE FINDINGS Alignment: Previous C5-C7 ACDF. Trace anterolisthesis at C4-5 and C7-T1, possibly adjacent segment disease. Skull base and vertebrae: No fracture. Pseudarthrosis C6-C7. Soft tissues and spinal canal: No prevertebral fluid or swelling. No visible canal hematoma. Disc levels: No canal hematoma. Suspected calcified protrusion C4-C5. Multilevel facet arthropathy. Upper chest: Emphysematous change. No upper rib fracture or  pneumothorax. Biapical pleural thickening. Atherosclerosis. CABG. Other: None IMPRESSION: 1. Atrophy and small vessel disease. Remote infarcts. No skull fracture or intracranial hemorrhage. 2. No cervical spine fracture or traumatic subluxation. 3. Previous C5-C7 ACDF.  Chronic Pseudarthrosis C6-C7. Emphysema (ICD10-J43.9). Electronically Signed   By: Elsie StainJohn T Curnes M.D.   On: 05/07/2018 14:51   Ct Cervical Spine Wo Contrast  Result Date: 05/07/2018 CLINICAL DATA:  C-spine trauma, high clinical risk. Patient at skilled nursing facility with syncopal episode. History of dementia. EXAM: CT HEAD WITHOUT CONTRAST CT CERVICAL SPINE WITHOUT CONTRAST TECHNIQUE: Multidetector CT imaging of the head and cervical spine was performed following the standard protocol without intravenous contrast. Multiplanar CT image reconstructions of the cervical spine were also generated. COMPARISON:  03/20/2017. FINDINGS: CT HEAD FINDINGS Brain: No evidence of acute stroke, acute hemorrhage, mass lesion, or extra-axial fluid. Generalized atrophy. Extensive hypoattenuation of white matter, representing small vessel disease. Old LEFT basal ganglia infarct. Old RIGHT occipital infarct. Vascular: Calcification of the cavernous internal carotid arteries consistent with cerebrovascular atherosclerotic disease. No signs of intracranial large vessel occlusion. Skull: Calvarium intact. No fracture. Sinuses/Orbits: Unremarkable. Other: None. CT CERVICAL SPINE FINDINGS Alignment: Previous C5-C7 ACDF. Trace anterolisthesis at C4-5 and C7-T1, possibly adjacent segment disease. Skull base and vertebrae: No fracture. Pseudarthrosis C6-C7. Soft tissues and spinal canal:  No prevertebral fluid or swelling. No visible canal hematoma. Disc levels: No canal hematoma. Suspected calcified protrusion C4-C5. Multilevel facet arthropathy. Upper chest: Emphysematous change. No upper rib fracture or pneumothorax. Biapical pleural thickening. Atherosclerosis. CABG.  Other: None IMPRESSION: 1. Atrophy and small vessel disease. Remote infarcts. No skull fracture or intracranial hemorrhage. 2. No cervical spine fracture or traumatic subluxation. 3. Previous C5-C7 ACDF.  Chronic Pseudarthrosis C6-C7. Emphysema (ICD10-J43.9). Electronically Signed   By: Elsie Stain M.D.   On: 05/07/2018 14:51   Ct Angio Chest/abd/pel For Dissection W And/or W/wo  Result Date: 05/07/2018 CLINICAL DATA:  84 y/o F; syncopal episode. History of abdominal aortic aneurysm. EXAM: CT ANGIOGRAPHY CHEST, ABDOMEN AND PELVIS TECHNIQUE: Multidetector CT imaging through the chest, abdomen and pelvis was performed using the standard protocol during bolus administration of intravenous contrast. Multiplanar reconstructed images and MIPs were obtained and reviewed to evaluate the vascular anatomy. CONTRAST:  ISOVUE-370 IOPAMIDOL (ISOVUE-370) INJECTION 76% COMPARISON:  02/11/2017 CT chest. 01/29/2017 CT abdomen and pelvis. FINDINGS: CTA CHEST FINDINGS Cardiovascular: Mild cardiomegaly. Normal caliber thoracic aorta and main pulmonary artery. Severe mixed plaque of the aorta and severe calcification of coronary arteries. Status post CABG with saphenous grafts and LIMA. Descending thoracic aorta ectasia to 3.5 cm. Mediastinum/Nodes: No enlarged mediastinal, hilar, or axillary lymph nodes. Thyroid gland, trachea, and esophagus demonstrate no significant findings. Lungs/Pleura: Right lower lobe spiculated nodule measuring 19 x 16 mm (series 9, image 109) increased in size from prior studies. Multiple additional stable ground-glass and solid subcentimeter peripheral pulmonary nodules, fibrotic changes, right apical scarring, and emphysema. No pleural effusion or pneumothorax. Musculoskeletal: Pectus deformity, Haller index 2.9. healed median sternotomy. Stable T9 moderate compression deformity Review of the MIP images confirms the above findings. CTA ABDOMEN AND PELVIS FINDINGS VASCULAR Aorta: Severe mixed  plaque of the aorta. Infrarenal abdominal aortic aneurysm measuring 5.9 x 6.8 cm (AP by ML series 11, image 85 and series 10, image 39). Celiac: Calcified plaque of celiac origin with mild 50% stenosis. SMA: Mixed plaque of proximal SMA with mild 50% stenosis. Renals: Mixed plaque of bilateral proximal renal arteries with severe stenosis, greater on the left. IMA: Not identified. Inflow: Severe calcific atherosclerosis with multiple segments of mild stenosis. Veins: No obvious venous abnormality within the limitations of this arterial phase study. Review of the MIP images confirms the above findings. NON-VASCULAR Hepatobiliary: No focal liver abnormality is seen. No gallstones, gallbladder wall thickening, or biliary dilatation. Pancreas: Unremarkable. No pancreatic ductal dilatation or surrounding inflammatory changes. Spleen: Normal in size without focal abnormality. Adrenals/Urinary Tract: Adrenal glands are unremarkable. Left kidney atrophy. Right kidney lower pole subcentimeter simple cyst in left kidney interpole subcentimeter hemorrhagic cyst. No urinary stone disease or hydronephrosis. Normal bladder. Stomach/Bowel: Stomach is within normal limits. Appendix not identified, no pericecal inflammation. No evidence of bowel wall thickening, distention, or inflammatory changes. Lymphatic: Aortic atherosclerosis. No enlarged abdominal or pelvic lymph nodes. Reproductive: Status post hysterectomy. No adnexal masses. Other: No abdominal wall hernia or abnormality. No abdominopelvic ascites. Musculoskeletal: No fracture is seen. Review of the MIP images confirms the above findings. IMPRESSION: 1. No acute process identified. 2. Infrarenal abdominal aortic aneurysm measures 6.8 cm, previously 5.8 cm. Recommend followup by abdomen and pelvis CTA in 3-6 months, and vascular surgery referral/consultation if not already obtained. This recommendation follows ACR consensus guidelines: White Paper of the ACR Incidental  Findings Committee II on Vascular Findings. J Am Coll Radiol 2013; 10:789-794. 3. Right lower lobe 19 mm spiculated nodule,  increased in size from 10/18. Pulmonary malignancy is suspected. Consider PET-CT and/or tissue sampling. 4. Stable chronic moderate T9 compression deformity. 5. Severe aortic and branch vessel atherosclerosis. Status post CABG. 6. Aortic Atherosclerosis (ICD10-I70.0) and Emphysema (ICD10-J43.9). Electronically Signed   By: Mitzi Hansen M.D.   On: 05/07/2018 14:59    Procedures Procedures (including critical care time)  Medications Ordered in ED Medications  iopamidol (ISOVUE-370) 76 % injection (has no administration in time range)  sodium chloride 0.9 % bolus 1,000 mL (1,000 mLs Intravenous New Bag/Given 05/07/18 1246)  acetaminophen (TYLENOL) tablet 650 mg (650 mg Oral Given 05/07/18 1337)  iopamidol (ISOVUE-370) 76 % injection 100 mL (100 mLs Intravenous Contrast Given 05/07/18 1411)     Initial Impression / Assessment and Plan / ED Course  I have reviewed the triage vital signs and the nursing notes.  Pertinent labs & imaging results that were available during my care of the patient were reviewed by me and considered in my medical decision making (see chart for details).     84 year old female with a past medical history of AAA, presents to ED for syncopal episode that occurred prior to arrival.  This was witnessed by staff members at her SNF.  Seems that patient has been seen and admitted for similar symptoms in the past due to dehydration.  Patient states that she feels "shaky" and complains of neck pain, headache, back pain and groin pain.  Diffuse tenderness palpation of these areas.  No deficits on neurological exam noted.  She is afebrile.  Other vital signs within normal limits.  Creatinine of 1.4 which is slightly higher than prior.  Hemoglobin stable.  Urinalysis, EKG unremarkable.  Shows sinus rhythm.  Chest x-ray is unremarkable.  CT of the head is  negative.  CT dissection study shows new lung nodule and increase in size of AAA to 6.8 cm, previously 5.8 cm last year.  Spoke to family members at bedside, they do not feel that patient is a good surgical candidate, do not want to proceed with surgery or vascular consult.  Will consult hospitalist for admission for syncope possibly 2/2 dehydration, order palliative care consult for new lung nodule and AAA.    Portions of this note were generated with Scientist, clinical (histocompatibility and immunogenetics). Dictation errors may occur despite best attempts at proofreading.  Final Clinical Impressions(s) / ED Diagnoses   Final diagnoses:  Syncope, unspecified syncope type  Abdominal aortic aneurysm (AAA) greater than 5.0 cm in diameter in female [facility])  Lung nodule    ED Discharge Orders    None       Dietrich Pates, PA-C 05/07/18 1554    Azalia Bilis, MD 05/08/18 [phone]

## 2018-05-07 NOTE — ED Notes (Signed)
ED TO INPATIENT HANDOFF REPORT  Name/Age/Gender Tonya Payne 84 y.o. female  Code Status    Code Status Orders  (From admission, onward)         Start     Ordered   05/07/18 1714  Do not attempt resuscitation (DNR)  Continuous    Question Answer Comment  In the event of cardiac or respiratory ARREST Do not call a "code blue"   In the event of cardiac or respiratory ARREST Do not perform Intubation, CPR, defibrillation or ACLS   In the event of cardiac or respiratory ARREST Use medication by any route, position, wound care, and other measures to relive pain and suffering. May use oxygen, suction and manual treatment of airway obstruction as needed for comfort.      05/07/18 1713        Code Status History    Date Active Date Inactive Code Status Order ID Comments User Context   02/24/2017 1532 02/26/2017 2042 Full Code [id number]  Gwenyth Bender, NP ED   02/12/2017 0046 02/14/2017 1924 Full Code [id number]  Haydee Monica, MD Inpatient   01/29/2017 2327 02/03/2017 2343 DNR [id number]  Briscoe Deutscher, MD ED   12/30/2016 0035 01/01/2017 1624 Full Code [id number]  Lorretta Harp, MD ED   11/12/2016 1532 11/16/2016 1503 DNR [id number]  Rolly Salter, MD ED   09/24/2016 0023 09/27/2016 0304 DNR [id number]  Eduard Clos, MD Inpatient   09/23/2016 2319 09/24/2016 0023 Full Code [id number]  Eduard Clos, MD Inpatient   01/27/2016 1024 01/29/2016 2007 DNR [id number]  Albertine Grates, MD Inpatient   01/27/2016 0226 01/27/2016 1024 Full Code [id number]  Hillary Bow, DO Inpatient   10/06/2015 1649 10/16/2015 1228 Full Code [id number]  Leone Brand, NP Inpatient   02/25/2015 2032 02/28/2015 1229 Full Code [id number]  Marlon Pel, PA-C ED   07/09/2013 1121 07/09/2013 2006 Full Code [id number]  Joya Gaskins, MD ED      Home/SNF/Other   Chief Complaint syncope  Level of Care/Admitting Diagnosis ED Disposition    ED Disposition Condition Comment   Admit  Hospital Area: [facility] [place] [[id number]]  Level of Care: Medical Telemetry [104]  I expect the patient will be discharged within 24 hours: Yes  LOW acuity---Tx typically complete <24 hrs---ACUTE conditions typically can be evaluated <24 hours---LABS likely to return to acceptable levels <24 hours---IS near functional baseline---EXPECTED to return to current living arrangement---NOT newly hypoxic: Meets criteria for 5C-Observation unit  Diagnosis: Syncope [206001]  Admitting Physician: Chiquita Loth  Attending Physician: Starleen Arms [[id number]  Bed request comments: 5 west if available  PT Class (Do Not Modify): Observation [104]  PT Acc Code (Do Not Modify): Observation [10022]       Medical History Past Medical History:  Diagnosis Date  . AAA (abdominal aortic aneurysm) (HCC)   . AAA (abdominal aortic aneurysm) without rupture (HCC) 2016   CTA 10/23/2015 - saccular infrarenal aneurysm roughly 4.8 cm in diameter. Stable from 2016 -- Dr. Darrick Penna  . Anterior wall myocardial infarction (HCC) 10/08/2015   Presented with severe chest pain and dynamic anterior ST elevations/biphasic elevations area did not meet full criteria for STEMI, and troponin was not dramatically elevated. 95% LAD treated with PTCA followed by CABG  . Anxiety   . Atrial fibrillation, new onset (HCC) 10/2015   after CABG, in SR at d/c  . CAD (coronary artery disease), native coronary artery 10/08/2015   Severe coronary disease  with calcified left main and LAD: 5% left main going into ostial LAD. Mid LAD 95% (treated with PTCA). Proximal RCA 50%, calcified. EF was 20-25% with apical akinesis and distal anterior hypokinesis. --> Referred for CABG  . CAP (community acquired pneumonia) 07/2013   Hattie Perch/notes 07/09/2013  . Congestive dilated cardiomyopathy (HCC) 10/08/2015   Intra-Op TEE showed EF of 40 and 45%. She had an echo with a "normal LV function "documented, but there was no reading M.D.  . COPD (chronic obstructive  pulmonary disease) (HCC)   . Depression   . Headache    "weekly" (12/31/2016)  . Hyperlipidemia LDL goal <70 10/08/2015  . IBS (irritable bowel syndrome)   . Migraine    "none in years" (12/31/2016)  . Oral thrush   . Post PTCA 10/08/2015   Presented with STEMI, severe mid LAD lesion treated with PTCA and then referred for CABG.  . Syncope   . TIA (transient ischemic attack)     Allergies No Known Allergies  IV Location/Drains/Wounds Patient Lines/Drains/Airways Status   Active Line/Drains/Airways    Name:   Placement date:   Placement time:   Site:   Days:   Peripheral IV 05/07/18 Left Antecubital   05/07/18    1047    Antecubital   less than 1   Incision (Closed) 09/24/16 Hip Left   09/24/16    1919     590   Pressure Injury 11/12/16 Unstageable - Full thickness tissue loss in which the base of the ulcer is covered by slough (yellow, tan, gray, green or brown) and/or eschar (tan, brown or black) in the wound bed. healing pressure ulcer   11/12/16    1500     541   Pressure Injury 11/12/16 Unstageable - Full thickness tissue loss in which the base of the ulcer is covered by slough (yellow, tan, gray, green or brown) and/or eschar (tan, brown or black) in the wound bed.   11/12/16    1500     541   Pressure Injury 01/30/17 Stage I -  Intact skin with non-blanchable redness of a localized area usually over a bony prominence. blanchable red   01/30/17    1500     462   Wound / Incision (Open or Dehisced) 12/30/16 Laceration Head Posterior STAPLES X3 INTACT   12/30/16    0929    Head   493          Labs/Imaging Results for orders placed or performed during the hospital encounter of 05/07/18 (from the past 48 hour(s))  Basic metabolic panel     Status: Abnormal   Collection Time: 05/07/18 12:45 PM  Result Value Ref Range   Sodium 137 135 - 145 mmol/L   Potassium 4.7 3.5 - 5.1 mmol/L   Chloride 104 98 - 111 mmol/L   CO2 23 22 - 32 mmol/L   Glucose, Bld 93 70 - 99 mg/dL   BUN 25 (H)  8 - 23 mg/dL   Creatinine, Ser 1.611.41 (H) 0.44 - 1.00 mg/dL   Calcium 9.2 8.9 - 09.610.3 mg/dL   GFR calc non Af Amer 35 (L) >60 mL/min   GFR calc Af Amer 40 (L) >60 mL/min   Anion gap 10 5 - 15    Comment: Performed at [facility], [address] [place], [place] [id number]  CBC     Status: Abnormal   Collection Time: 05/07/18 12:45 PM  Result Value Ref Range   WBC 7.8 4.0 -  10.5 K/uL   RBC 3.90 3.87 - 5.11 MIL/uL   Hemoglobin 11.8 (L) 12.0 - 15.0 g/dL   HCT 16.1 09.6 - 04.5 %   MCV 96.2 80.0 - 100.0 fL   MCH 30.3 26.0 - 34.0 pg   MCHC 31.5 30.0 - 36.0 g/dL   RDW 40.9 81.1 - 91.4 %   Platelets 164 150 - 400 K/uL   nRBC 0.0 0.0 - 0.2 %    Comment: Performed at [facility], [address] [place], [place] [address]  Urinalysis, Routine w reflex microscopic     Status: Abnormal   Collection Time: 05/07/18  1:57 PM  Result Value Ref Range   Color, Urine YELLOW YELLOW   APPearance HAZY (A) CLEAR   Specific Gravity, Urine 1.020 1.005 - 1.030   pH 5.0 5.0 - 8.0   Glucose, UA NEGATIVE NEGATIVE mg/dL   Hgb urine dipstick NEGATIVE NEGATIVE   Bilirubin Urine NEGATIVE NEGATIVE   Ketones, ur 5 (A) NEGATIVE mg/dL   Protein, ur NEGATIVE NEGATIVE mg/dL   Nitrite NEGATIVE NEGATIVE   Leukocytes, UA NEGATIVE NEGATIVE    Comment: Performed at [facility], [address], [place], [place] [address]  Vitamin B12     Status: None   Collection Time: 05/07/18  5:19 PM  Result Value Ref Range   Vitamin B-12 628 180 - 914 pg/mL    Comment: (NOTE) This assay is not validated for testing neonatal or myeloproliferative syndrome specimens for Vitamin B12 levels. Performed at [facility], [address] [place], [place] [address]   Folate     Status: None   Collection Time: 05/07/18  5:19 PM  Result Value Ref Range   Folate 21.0 >5.9 ng/mL    Comment: Performed at [facility], [address] [place], [place] [address]  TSH     Status: None   Collection Time:  05/07/18  5:19 PM  Result Value Ref Range   TSH 2.533 0.350 - 4.500 uIU/mL    Comment: Performed by a 3rd Generation assay with a functional sensitivity of <=0.01 uIU/mL. Performed at [facility], [address], [place], [place] [address]   T4, free     Status: None   Collection Time: 05/07/18  5:19 PM  Result Value Ref Range   Free T4 0.98 0.82 - 1.77 ng/dL    Comment: (NOTE) Biotin ingestion may interfere with free T4 tests. If the results are inconsistent with the TSH level, previous test results, or the clinical presentation, then consider biotin interference. If needed, order repeat testing after stopping biotin. Performed at [facility], [address] [place], [place] [address]    Dg Chest 2 View  Result Date: 05/07/2018 CLINICAL DATA:  Syncope. EXAM: CHEST - 2 VIEW COMPARISON:  Radiographs of March 20, 2017. FINDINGS: The heart size and mediastinal contours are within normal limits. No pneumothorax or pleural effusion is noted. Atherosclerosis of thoracic aorta is noted. Status post coronary bypass graft. Stable bibasilar scarring is noted. No acute pulmonary disease is noted. Stable old lower thoracic vertebral body compression fracture. IMPRESSION: No active cardiopulmonary disease. Aortic Atherosclerosis (ICD10-I70.0). Electronically Signed   By: Lupita Raider, M.D.   On: 05/07/2018 12:18   Ct Head Wo Contrast  Result Date: 05/07/2018 CLINICAL DATA:  C-spine trauma, high clinical risk. Patient at skilled nursing facility with syncopal episode. History of dementia. EXAM: CT HEAD WITHOUT CONTRAST CT CERVICAL SPINE WITHOUT  CONTRAST TECHNIQUE: Multidetector CT imaging of the head and cervical spine was performed following the standard protocol without intravenous contrast. Multiplanar CT image reconstructions of the cervical spine were also generated. COMPARISON:  03/20/2017. FINDINGS: CT HEAD FINDINGS Brain: No evidence of acute stroke, acute hemorrhage, mass  lesion, or extra-axial fluid. Generalized atrophy. Extensive hypoattenuation of white matter, representing small vessel disease. Old LEFT basal ganglia infarct. Old RIGHT occipital infarct. Vascular: Calcification of the cavernous internal carotid arteries consistent with cerebrovascular atherosclerotic disease. No signs of intracranial large vessel occlusion. Skull: Calvarium intact. No fracture. Sinuses/Orbits: Unremarkable. Other: None. CT CERVICAL SPINE FINDINGS Alignment: Previous C5-C7 ACDF. Trace anterolisthesis at C4-5 and C7-T1, possibly adjacent segment disease. Skull base and vertebrae: No fracture. Pseudarthrosis C6-C7. Soft tissues and spinal canal: No prevertebral fluid or swelling. No visible canal hematoma. Disc levels: No canal hematoma. Suspected calcified protrusion C4-C5. Multilevel facet arthropathy. Upper chest: Emphysematous change. No upper rib fracture or pneumothorax. Biapical pleural thickening. Atherosclerosis. CABG. Other: None IMPRESSION: 1. Atrophy and small vessel disease. Remote infarcts. No skull fracture or intracranial hemorrhage. 2. No cervical spine fracture or traumatic subluxation. 3. Previous C5-C7 ACDF.  Chronic Pseudarthrosis C6-C7. Emphysema (ICD10-J43.9). Electronically Signed   By: Elsie Stain M.D.   On: 05/07/2018 14:51   Ct Cervical Spine Wo Contrast  Result Date: 05/07/2018 CLINICAL DATA:  C-spine trauma, high clinical risk. Patient at skilled nursing facility with syncopal episode. History of dementia. EXAM: CT HEAD WITHOUT CONTRAST CT CERVICAL SPINE WITHOUT CONTRAST TECHNIQUE: Multidetector CT imaging of the head and cervical spine was performed following the standard protocol without intravenous contrast. Multiplanar CT image reconstructions of the cervical spine were also generated. COMPARISON:  03/20/2017. FINDINGS: CT HEAD FINDINGS Brain: No evidence of acute stroke, acute hemorrhage, mass lesion, or extra-axial fluid. Generalized atrophy. Extensive  hypoattenuation of white matter, representing small vessel disease. Old LEFT basal ganglia infarct. Old RIGHT occipital infarct. Vascular: Calcification of the cavernous internal carotid arteries consistent with cerebrovascular atherosclerotic disease. No signs of intracranial large vessel occlusion. Skull: Calvarium intact. No fracture. Sinuses/Orbits: Unremarkable. Other: None. CT CERVICAL SPINE FINDINGS Alignment: Previous C5-C7 ACDF. Trace anterolisthesis at C4-5 and C7-T1, possibly adjacent segment disease. Skull base and vertebrae: No fracture. Pseudarthrosis C6-C7. Soft tissues and spinal canal: No prevertebral fluid or swelling. No visible canal hematoma. Disc levels: No canal hematoma. Suspected calcified protrusion C4-C5. Multilevel facet arthropathy. Upper chest: Emphysematous change. No upper rib fracture or pneumothorax. Biapical pleural thickening. Atherosclerosis. CABG. Other: None IMPRESSION: 1. Atrophy and small vessel disease. Remote infarcts. No skull fracture or intracranial hemorrhage. 2. No cervical spine fracture or traumatic subluxation. 3. Previous C5-C7 ACDF.  Chronic Pseudarthrosis C6-C7. Emphysema (ICD10-J43.9). Electronically Signed   By: Elsie Stain M.D.   On: 05/07/2018 14:51   Ct Angio Chest/abd/pel For Dissection W And/or W/wo  Result Date: 05/07/2018 CLINICAL DATA:  84 y/o F; syncopal episode. History of abdominal aortic aneurysm. EXAM: CT ANGIOGRAPHY CHEST, ABDOMEN AND PELVIS TECHNIQUE: Multidetector CT imaging through the chest, abdomen and pelvis was performed using the standard protocol during bolus administration of intravenous contrast. Multiplanar reconstructed images and MIPs were obtained and reviewed to evaluate the vascular anatomy. CONTRAST:  ISOVUE-370 IOPAMIDOL (ISOVUE-370) INJECTION 76% COMPARISON:  02/11/2017 CT chest. 01/29/2017 CT abdomen and pelvis. FINDINGS: CTA CHEST FINDINGS Cardiovascular: Mild cardiomegaly. Normal caliber thoracic aorta and main  pulmonary artery. Severe mixed plaque of the aorta and severe calcification of coronary arteries. Status post CABG with saphenous grafts and LIMA. Descending thoracic aorta  ectasia to 3.5 cm. Mediastinum/Nodes: No enlarged mediastinal, hilar, or axillary lymph nodes. Thyroid gland, trachea, and esophagus demonstrate no significant findings. Lungs/Pleura: Right lower lobe spiculated nodule measuring 19 x 16 mm (series 9, image 109) increased in size from prior studies. Multiple additional stable ground-glass and solid subcentimeter peripheral pulmonary nodules, fibrotic changes, right apical scarring, and emphysema. No pleural effusion or pneumothorax. Musculoskeletal: Pectus deformity, Haller index 2.9. healed median sternotomy. Stable T9 moderate compression deformity Review of the MIP images confirms the above findings. CTA ABDOMEN AND PELVIS FINDINGS VASCULAR Aorta: Severe mixed plaque of the aorta. Infrarenal abdominal aortic aneurysm measuring 5.9 x 6.8 cm (AP by ML series 11, image 85 and series 10, image 39). Celiac: Calcified plaque of celiac origin with mild 50% stenosis. SMA: Mixed plaque of proximal SMA with mild 50% stenosis. Renals: Mixed plaque of bilateral proximal renal arteries with severe stenosis, greater on the left. IMA: Not identified. Inflow: Severe calcific atherosclerosis with multiple segments of mild stenosis. Veins: No obvious venous abnormality within the limitations of this arterial phase study. Review of the MIP images confirms the above findings. NON-VASCULAR Hepatobiliary: No focal liver abnormality is seen. No gallstones, gallbladder wall thickening, or biliary dilatation. Pancreas: Unremarkable. No pancreatic ductal dilatation or surrounding inflammatory changes. Spleen: Normal in size without focal abnormality. Adrenals/Urinary Tract: Adrenal glands are unremarkable. Left kidney atrophy. Right kidney lower pole subcentimeter simple cyst in left kidney interpole subcentimeter  hemorrhagic cyst. No urinary stone disease or hydronephrosis. Normal bladder. Stomach/Bowel: Stomach is within normal limits. Appendix not identified, no pericecal inflammation. No evidence of bowel wall thickening, distention, or inflammatory changes. Lymphatic: Aortic atherosclerosis. No enlarged abdominal or pelvic lymph nodes. Reproductive: Status post hysterectomy. No adnexal masses. Other: No abdominal wall hernia or abnormality. No abdominopelvic ascites. Musculoskeletal: No fracture is seen. Review of the MIP images confirms the above findings. IMPRESSION: 1. No acute process identified. 2. Infrarenal abdominal aortic aneurysm measures 6.8 cm, previously 5.8 cm. Recommend followup by abdomen and pelvis CTA in 3-6 months, and vascular surgery referral/consultation if not already obtained. This recommendation follows ACR consensus guidelines: White Paper of the ACR Incidental Findings Committee II on Vascular Findings. J Am Coll Radiol 2013; 10:789-794. 3. Right lower lobe 19 mm spiculated nodule, increased in size from 10/18. Pulmonary malignancy is suspected. Consider PET-CT and/or tissue sampling. 4. Stable chronic moderate T9 compression deformity. 5. Severe aortic and branch vessel atherosclerosis. Status post CABG. 6. Aortic Atherosclerosis (ICD10-I70.0) and Emphysema (ICD10-J43.9). Electronically Signed   By: Mitzi Hansen M.D.   On: 05/07/2018 14:59    Pending Labs Unresulted Labs (From admission, onward)    Start     Ordered   05/07/18 1105  Urine culture  ONCE - STAT,   STAT     05/07/18 1104   Signed and Held  CBC  (heparin)  Once,   R    Comments:  Baseline for heparin therapy IF NOT ALREADY DRAWN.  Notify MD if PLT < 100 K.    Signed and Held   Signed and Held  Creatinine, serum  (heparin)  Once,   R    Comments:  Baseline for heparin therapy IF NOT ALREADY DRAWN.    Signed and Held   Signed and Held  Basic metabolic panel  Tomorrow morning,   R     Signed and Held    Signed and Held  CBC  Tomorrow morning,   R     Signed and Held  Vitals/Pain Today's Vitals   05/07/18 1630 05/07/18 1700 05/07/18 1730 05/07/18 1927  BP: 108/62 127/86 135/79 112/69  Pulse: 81 91 80 76  Resp: 19 (!) 22 14 18   Temp:      TempSrc:      SpO2: 100% 100% 100% 100%  PainSc:        Isolation Precautions No active isolations  Medications Medications  iopamidol (ISOVUE-370) 76 % injection (has no administration in time range)  0.9 %  sodium chloride infusion ( Intravenous New Bag/Given 05/07/18 1720)  sodium chloride 0.9 % bolus 1,000 mL (0 mLs Intravenous Stopped 05/07/18 1713)  acetaminophen (TYLENOL) tablet 650 mg (650 mg Oral Given 05/07/18 1337)  iopamidol (ISOVUE-370) 76 % injection 100 mL (100 mLs Intravenous Contrast Given 05/07/18 1411)    Mobility walks with person assist

## 2018-05-07 NOTE — ED Notes (Signed)
Regular dinner ordered 

## 2018-05-08 DIAGNOSIS — I951 Orthostatic hypotension: Secondary | ICD-10-CM

## 2018-05-08 DIAGNOSIS — N183 Chronic kidney disease, stage 3 (moderate): Secondary | ICD-10-CM | POA: Diagnosis not present

## 2018-05-08 DIAGNOSIS — R55 Syncope and collapse: Secondary | ICD-10-CM | POA: Diagnosis not present

## 2018-05-08 DIAGNOSIS — I714 Abdominal aortic aneurysm, without rupture: Secondary | ICD-10-CM | POA: Diagnosis not present

## 2018-05-08 DIAGNOSIS — N179 Acute kidney failure, unspecified: Secondary | ICD-10-CM | POA: Diagnosis not present

## 2018-05-08 DIAGNOSIS — R911 Solitary pulmonary nodule: Secondary | ICD-10-CM | POA: Diagnosis not present

## 2018-05-08 LAB — URINE CULTURE

## 2018-05-08 LAB — CBC
HCT: 32.1 % — ABNORMAL LOW (ref 36.0–46.0)
Hemoglobin: 10.1 g/dL — ABNORMAL LOW (ref 12.0–15.0)
MCH: 30 pg (ref 26.0–34.0)
MCHC: 31.5 g/dL (ref 30.0–36.0)
MCV: 95.3 fL (ref 80.0–100.0)
Platelets: 159 10*3/uL (ref 150–400)
RBC: 3.37 MIL/uL — ABNORMAL LOW (ref 3.87–5.11)
RDW: 13.3 % (ref 11.5–15.5)
WBC: 6.9 10*3/uL (ref 4.0–10.5)
nRBC: 0 % (ref 0.0–0.2)

## 2018-05-08 LAB — BASIC METABOLIC PANEL
Anion gap: 7 (ref 5–15)
BUN: 19 mg/dL (ref 8–23)
CO2: 22 mmol/L (ref 22–32)
Calcium: 8.6 mg/dL — ABNORMAL LOW (ref 8.9–10.3)
Chloride: 108 mmol/L (ref 98–111)
Creatinine, Ser: 1.16 mg/dL — ABNORMAL HIGH (ref 0.44–1.00)
GFR calc Af Amer: 51 mL/min — ABNORMAL LOW (ref >60)
GFR calc non Af Amer: 44 mL/min — ABNORMAL LOW (ref >60)
Glucose, Bld: 108 mg/dL — ABNORMAL HIGH (ref 70–99)
Potassium: 4 mmol/L (ref 3.5–5.1)
Sodium: 137 mmol/L (ref 135–145)

## 2018-05-08 NOTE — Progress Notes (Signed)
Pt prepared for d/c to SNF. IV d/c'd. Skin intact except as charted in most recent assessments. Vitals are stable. Report called to receiving facility. Pt to be transported by ambulance service. 

## 2018-05-08 NOTE — Discharge Instructions (Signed)
Follow with Primary MD Bernette Redbird, MD in 7 days   Get CBC, CMP,  checked  by Primary MD next visit.    Activity: As tolerated with Full fall precautions use walker/cane & assistance as needed   Disposition SNF   Diet: Regular diet , with feeding assistance and aspiration precautions.  On your next visit with your primary care physician please Get Medicines reviewed and adjusted.   Please request your Prim.MD to go over all Hospital Tests and Procedure/Radiological results at the follow up, please get all Hospital records sent to your Prim MD by signing hospital release before you go home.   If you experience worsening of your admission symptoms, develop shortness of breath, life threatening emergency, suicidal or homicidal thoughts you must seek medical attention immediately by calling 911 or calling your MD immediately  if symptoms less severe.  You Must read complete instructions/literature along with all the possible adverse reactions/side effects for all the Medicines you take and that have been prescribed to you. Take any new Medicines after you have completely understood and accpet all the possible adverse reactions/side effects.   Do not drive, operating heavy machinery, perform activities at heights, swimming or participation in water activities or provide baby sitting services if your were admitted for syncope or siezures until you have seen by Primary MD or a Neurologist and advised to do so again.  Do not drive when taking Pain medications.    Do not take more than prescribed Pain, Sleep and Anxiety Medications  Special Instructions: If you have smoked or chewed Tobacco  in the last 2 yrs please stop smoking, stop any regular Alcohol  and or any Recreational drug use.  Wear Seat belts while driving.   Please note  You were cared for by a hospitalist during your hospital stay. If you have any questions about your discharge medications or the care you received while  you were in the hospital after you are discharged, you can call the unit and asked to speak with the hospitalist on call if the hospitalist that took care of you is not available. Once you are discharged, your primary care physician will handle any further medical issues. Please note that NO REFILLS for any discharge medications will be authorized once you are discharged, as it is imperative that you return to your primary care physician (or establish a relationship with a primary care physician if you do not have one) for your aftercare needs so that they can reassess your need for medications and monitor your lab values.

## 2018-05-08 NOTE — Progress Notes (Signed)
Patient will DC to: [facility]  Anticipated DC date: 05/08/18 Family notified: Son Transport by: Sharin Mons   Per MD patient ready for DC to [place]. RN, patient, patient's family, and facility notified of DC. Discharge Summary and FL2 sent to facility. RN to call report prior to discharge [phone]). DC packet on chart. Ambulance transport requested for patient.   CSW will sign off for now as social work intervention is no longer needed. Please consult [place] again if new needs arise.  Cristobal Goldmann, LCSW Clinical Social Worker [phone]

## 2018-05-08 NOTE — Progress Notes (Signed)
   05/08/18 1100  Clinical Encounter Type  Visited With Patient and family together  Visit Type Spiritual support;Initial  Referral From Patient  Consult/Referral To Chaplain  Spiritual Encounters  Spiritual Needs Emotional  Responded to [facility] consult for support. Patient in bed and her son was at bedside. Provided emotional, social and spiritual support.

## 2018-05-08 NOTE — Clinical Social Work Note (Signed)
Clinical Social Work Assessment  Patient Details  Name: Tonya Payne MRN: [id number] Date of Birth: 1934-07-09  Date of referral:  05/08/18               Reason for consult:  Facility Placement, Discharge Planning                Permission sought to share information with:  Facility Medical sales representative, Family Supports Permission granted to share information::  Yes, Verbal Permission Granted  Name::     Public house manager::  Phineas Semen  Relationship::  Son  Contact Information:  [phone]  Housing/Transportation Living arrangements for the past 2 months:  Skilled Nursing Facility Source of Information:  Patient Patient Interpreter Needed:  None Criminal Activity/Legal Involvement Pertinent to Current Situation/Hospitalization:  No - Comment as needed Significant Relationships:  Adult Children Lives with:  Facility Resident Do you feel safe going back to the place where you live?  Yes Need for family participation in patient care:  No (Coment)  Care giving concerns:  CSW received consult for possible SNF placement at time of discharge. CSW spoke with patient and her son. Patient has resided at [facility] for the past few months and will return at discharge. CSW to continue to follow and assist with discharge planning needs.   Social Worker assessment / plan:  CSW spoke with patient's son concerning return to SNF.   Employment status:  Retired Health and safety inspector:  Medicare PT Recommendations:  Not assessed at this time Information / Referral to community resources:  Skilled Nursing Facility  Patient/Family's Response to care:  Patient and son reports agreement with discharge back to [place]. He requests [facility] for transport.    Patient/Family's Understanding of and Emotional Response to Diagnosis, Current Treatment, and Prognosis:  Patient/family is realistic regarding therapy needs and expressed being hopeful for return to SNF placement. Patient's son expressed  understanding of CSW role and discharge process as well as medical condition. No questions/concerns about plan or treatment.    Emotional Assessment Appearance:  Appears stated age Attitude/Demeanor/Rapport:  Gracious Affect (typically observed):  Accepting, Appropriate Orientation:  Oriented to Self, Oriented to Place Alcohol / Substance use:  Not Applicable Psych involvement (Current and /or in the community):  No (Comment)  Discharge Needs  Concerns to be addressed:  Care Coordination Readmission within the last 30 days:  No Current discharge risk:  None Barriers to Discharge:  No Barriers Identified   Mearl Latin, LCSW 05/08/2018, 9:39 AM

## 2018-05-08 NOTE — NC FL2 (Signed)
[place] MEDICAID FL2 LEVEL OF CARE SCREENING TOOL     IDENTIFICATION  Patient Name: Tonya Payne Birthdate: [birth year] Sex: female Admission Date (Current Location): 05/07/2018  [facility] and [place] Number:  Producer, television/film/video and Address:  [facility], [address], [place], [place] [address]      Provider Number: [id number]  Attending Physician Name and Address:  Starleen Arms, MD  Relative Name and Phone Number:  Marcial Pacas, son, [phone]    Current Level of Care: Hospital Recommended Level of Care: Skilled Nursing Facility Prior Approval Number:    Date Approved/Denied:   PASRR Number: [id number]  Discharge Plan: SNF    Current Diagnoses: Patient Active Problem List   Diagnosis Date Noted  . Syncope 05/07/2018  . Syncope due to autonomic failure 02/25/2017  . Hyperglycemia 02/24/2017  . Diarrhea 02/24/2017  . Compression fx, lumbar spine (HCC) 02/24/2017  . Constipation 02/02/2017  . Hypomagnesemia 01/31/2017  . Orthostatic hypotension 01/31/2017  . Syncope and collapse 01/29/2017  . Nausea vomiting and diarrhea 01/29/2017  . Hypertensive urgency 01/29/2017  . COPD (chronic obstructive pulmonary disease) (HCC) 12/30/2016  . Closed compression fracture of thoracic vertebra (HCC) 12/30/2016  . Abdominal pain 12/30/2016  . Alcohol use 12/30/2016  . CAD (coronary artery disease) 12/30/2016  . Scalp laceration   . Acute renal failure superimposed on stage 3 chronic kidney disease (HCC)   . Dehydration   . Physical deconditioning   . Pressure injury of skin 11/12/2016  . CKD (chronic kidney disease), stage III (HCC) 09/24/2016  . Closed left hip fracture, with routine healing, subsequent encounter 09/23/2016  . Hyponatremia 09/23/2016  . Hypertension 09/23/2016  . Left displaced femoral neck fracture (HCC)   . Closed fracture of multiple pubic rami, right, sequela 02/27/2016  . Displaced fracture of neck of right second  metacarpal bone with routine healing 02/27/2016  . Protein-calorie malnutrition, severe (HCC) 02/22/2016  . Orthostatic dizziness 02/22/2016  . Postoperative atrial fibrillation (HCC) 02/22/2016  . Fall   . Hand fracture   . Pelvic fracture (HCC) 01/26/2016  . AAA (abdominal aortic aneurysm) (HCC) 11/30/2015  . Post PTCA   . S/P CABG x 3 10/16/2015  . Coronary artery disease involving native coronary artery with angina pectoris (HCC) 10/08/2015  . Congestive dilated cardiomyopathy (HCC) 10/08/2015  . Hyperlipidemia LDL goal <70 10/08/2015  . E. coli UTI (urinary tract infection) 10/06/2015  . Myocardial infarction involving left anterior descending (LAD) coronary artery (HCC) 10/06/2015    Orientation RESPIRATION BLADDER Height & Weight     Self, Place  Normal Incontinent, External catheter Weight: 47.5 kg Height:  5\' 3"  (160 cm)  BEHAVIORAL SYMPTOMS/MOOD NEUROLOGICAL BOWEL NUTRITION STATUS      Continent Diet(Please see DC Summary)  AMBULATORY STATUS COMMUNICATION OF NEEDS Skin   Limited Assist Verbally PU Stage and Appropriate Care(Stage I on heel and toe)                       Personal Care Assistance Level of Assistance  Bathing, Feeding, Dressing Bathing Assistance: Limited assistance Feeding assistance: Limited assistance Dressing Assistance: Limited assistance     Functional Limitations Info  Sight, Hearing, Speech Sight Info: Adequate Hearing Info: Adequate Speech Info: Adequate    SPECIAL CARE FACTORS FREQUENCY                       Contractures Contractures Info: Not present    Additional Factors  Info  Code Status, Allergies, Psychotropic Code Status Info: DNR Allergies Info: NKA Psychotropic Info: Abilify;Trazadone;Depakote         Current Medications (05/08/2018):  This is the current hospital active medication list Current Facility-Administered Medications  Medication Dose Route Frequency Provider Last Rate Last Dose  . 0.9 %  sodium  chloride infusion   Intravenous Continuous Elgergawy, Dawood S, MD      . 0.9 %  sodium chloride infusion   Intravenous Continuous Elgergawy, Leana Roe, MD 75 mL/hr at 05/07/18 1720    . acetaminophen (TYLENOL) tablet 1,000 mg  1,000 mg Oral Q8H PRN Elgergawy, Leana Roe, MD   1,000 mg at 05/07/18 2312  . ARIPiprazole (ABILIFY) tablet 2 mg  2 mg Oral Daily Elgergawy, Leana Roe, MD   2 mg at 05/08/18 0858  . aspirin EC tablet 81 mg  81 mg Oral QPM Elgergawy, Leana Roe, MD   81 mg at 05/07/18 2255  . divalproex (DEPAKOTE) DR tablet 250 mg  250 mg Oral Daily Elgergawy, Leana Roe, MD   250 mg at 05/08/18 0845  . divalproex (DEPAKOTE) DR tablet 500 mg  500 mg Oral QHS Elgergawy, Leana Roe, MD   500 mg at 05/07/18 2256  . docusate sodium (COLACE) capsule 100 mg  100 mg Oral BID Elgergawy, Leana Roe, MD      . fluticasone (FLONASE) 50 MCG/ACT nasal spray 1 spray  1 spray Each Nare QHS Elgergawy, Leana Roe, MD   1 spray at 05/07/18 2256  . heparin injection 5,000 Units  5,000 Units Subcutaneous Q8H Elgergawy, Leana Roe, MD   5,000 Units at 05/08/18 0526  . meclizine (ANTIVERT) tablet 12.5 mg  12.5 mg Oral TID Elgergawy, Leana Roe, MD   12.5 mg at 05/08/18 0846  . meclizine (ANTIVERT) tablet 12.5 mg  12.5 mg Oral BID PRN Elgergawy, Leana Roe, MD      . Melatonin TABS 3 mg  3 mg Oral QHS Elgergawy, Leana Roe, MD   3 mg at 05/07/18 2256  . multivitamin with minerals tablet 1 tablet  1 tablet Oral QPM Elgergawy, Leana Roe, MD   1 tablet at 05/07/18 2255  . OLANZapine (ZYPREXA) tablet 2.5 mg  2.5 mg Oral QHS Elgergawy, Leana Roe, MD   2.5 mg at 05/07/18 2256  . polyethylene glycol (MIRALAX / GLYCOLAX) packet 17 g  17 g Oral Daily PRN Elgergawy, Leana Roe, MD      . senna (SENOKOT) tablet 8.6 mg  1 tablet Oral Daily Elgergawy, Leana Roe, MD      . traZODone (DESYREL) tablet 25 mg  25 mg Oral QHS Elgergawy, Leana Roe, MD   25 mg at 05/07/18 2300     Discharge Medications: Please see discharge summary for a list of discharge  medications.  Relevant Imaging Results:  Relevant Lab Results:   Additional Information SS#: [id number]  Mearl Latin, LCSW

## 2018-05-08 NOTE — Discharge Summary (Signed)
Tonya Payne, is a 84 y.o. female  DOB Aug 31, 1934  MRN [id number].  Admission date:  05/07/2018  Admitting Physician  Starleen Arms, MD  Discharge Date:  05/08/2018   Primary MD  Bernette Redbird, MD  Recommendations for primary care physician for things to follow:  -Consider palliative care consult at your facility to address long-term goals of care -Patient to continue with TED hose   Admission Diagnosis  Lung nodule [R91.1] Syncope, unspecified syncope type [R55] Abdominal aortic aneurysm (AAA) greater than 5.0 cm in diameter in female [facility]) [I71.4]   Discharge Diagnosis  Lung nodule [R91.1] Syncope, unspecified syncope type [R55] Abdominal aortic aneurysm (AAA) greater than 5.0 cm in diameter in female [facility]) [I71.4]    Active Problems:   AAA (abdominal aortic aneurysm) (HCC)   Acute renal failure superimposed on stage 3 chronic kidney disease (HCC)   Physical deconditioning   CAD (coronary artery disease)   Orthostatic hypotension   Syncope      Past Medical History:  Diagnosis Date  . AAA (abdominal aortic aneurysm) (HCC)   . AAA (abdominal aortic aneurysm) without rupture (HCC) 2016   CTA 10/23/2015 - saccular infrarenal aneurysm roughly 4.8 cm in diameter. Stable from 2016 -- Dr. Darrick Penna  . Anterior wall myocardial infarction (HCC) 10/08/2015   Presented with severe chest pain and dynamic anterior ST elevations/biphasic elevations area did not meet full criteria for STEMI, and troponin was not dramatically elevated. 95% LAD treated with PTCA followed by CABG  . Anxiety   . Atrial fibrillation, new onset (HCC) 10/2015   after CABG, in SR at d/c  . CAD (coronary artery disease), native coronary artery 10/08/2015   Severe coronary disease with calcified left main and LAD: 5% left main going into ostial LAD. Mid LAD 95% (treated with PTCA). Proximal RCA 50%, calcified. EF was 20-25% with  apical akinesis and distal anterior hypokinesis. --> Referred for CABG  . CAP (community acquired pneumonia) 07/2013   Hattie Perch 07/09/2013  . Congestive dilated cardiomyopathy (HCC) 10/08/2015   Intra-Op TEE showed EF of 40 and 45%. She had an echo with a "normal LV function "documented, but there was no reading M.D.  . COPD (chronic obstructive pulmonary disease) (HCC)   . Depression   . Headache    "weekly" (12/31/2016)  . Hyperlipidemia LDL goal <70 10/08/2015  . IBS (irritable bowel syndrome)   . Migraine    "none in years" (12/31/2016)  . Oral thrush   . Post PTCA 10/08/2015   Presented with STEMI, severe mid LAD lesion treated with PTCA and then referred for CABG.  . Syncope   . Syncope and collapse 05/07/2018  . TIA (transient ischemic attack)     Past Surgical History:  Procedure Laterality Date  . ABDOMINAL HYSTERECTOMY    . BACK SURGERY    . CARDIAC CATHETERIZATION N/A 10/06/2015   Procedure: Left Heart Cath and Coronary Angiography;  Surgeon: Marykay Lex, MD;  Location: [facility] INVASIVE CV LAB;  Service: Cardiovascular;: Heavily calcified left main-LAD.  75% LM & Ost LAD, 95% d-mLAD, p-mRCA Calcified 50%. EF ~20-25%  . CARDIAC CATHETERIZATION N/A 10/06/2015   Procedure: Coronary Balloon Angioplasty;  Surgeon: Marykay Lexavid W Harding, MD;  [facility];  Service: Cardiovascular: PTCA of 95% LAD in setting of STEMI - reduced to 70% --> would need Atherectomy-PCI vs. CABG.  Sent for CABG.  . COLECTOMY    . CORONARY ARTERY BYPASS GRAFT N/A 10/16/2015   Procedure: CORONARY ARTERY BYPASS GRAFTING (CABG)  x three, using left internal mammary artery and right leg greater saphenous vein harvested endoscopically;  Surgeon: Loreli SlotSteven C Hendrickson, MD;  [facility];  Service: Open Heart Surgery;  Laterality: N/A;  . FRACTURE SURGERY    . HIP PINNING,CANNULATED Left 09/24/2016   Procedure: CANNULATED HIP PINNING LEFT HIP;  Surgeon: Durene Romanslin, Matthew, MD;  Location: [facility] ORS;  Service:  Orthopedics;  Laterality: Left;  . LUMBAR DISC SURGERY     L3,4,5; S1"  . NECK SURGERY    . TEE WITHOUT CARDIOVERSION N/A 10/16/2015   Procedure: TRANSESOPHAGEAL ECHOCARDIOGRAM (TEE);  Surgeon: Loreli SlotSteven C Hendrickson, MD;  [facility];  Service: Open Heart Surgery - IntraOp:  EF 40-45%.  . TONSILLECTOMY    . TOTAL HIP ARTHROPLASTY Right        History of present illness and  Hospital Course:     Kindly see H&P for history of present illness and admission details, please review complete Labs, Consult reports and Test reports for all details in brief  HPI  from the history and physical done on the day of admission 05/07/2018  Tonya Payne  is a 84 y.o. female, with past medical history of CAD, COPD, anxiety, depression, hyperlipidemia, history of orthostasis, with syncope in the past, is to ED with syncopal episode at [facility], and is poor historian, history was obtained from ED staff and family, patient had syncopal events witnessed by nursing staff, she was sitting in wheelchair, patient was unresponsiveness, did not respond to sternal rub, episode lasted for few minutes, hard to wake up and responding to voice, patient denies any focal deficits, tingling, numbness, no facial droop noted, no seizure activity noted. -In ED patient was noted to be orthostatic, actually by EMS she was hypotensive 70/50, CT a chest abdomen pelvis significant for spiculated 2 cm lung mass, and 6.8 cm AAA, patient mentation back to baseline, CT head with no acute findings   Hospital Course    Syncope secondary to orthostasis -Likely in the setting of orthostatic hypotension, patient had history of this in the past, denies any chest pain, shortness of breath, she appears to be clinically dehydrated, she was admitted for observation, kept on IV fluids overnight, on telemetry, no significant events on telemetry, she was not orthostatic this morning. -No need for echo as it was due to orthostasis - continue  with TED hose as an outpatient - no Need for echo as it is clearly due to orthostasis -We will start on TED hose -hold her Pilocarpine (as it may produce hypotension)  AAA -Progressive in size, it is 6.8 cm currently, discussed with son and patient, they do not wish for any further work-up.  Lung Nodules -Has spiculated lung nodules, 2 cm, has grown in size since most recent CT, have discussed with son, at this point he did not wish for any further work-up regardless to finding they do not want to subject her to surgery or radiation or chemo.  Deconditioning/failure to thrive -Discharge to SNF  CAD -Treated  with aspirin and statin, certainly no beta-blockers or ACE in the setting of hypotension  Hyperlipidemia -Continue with statin  AKI -Creatinine 1.4 on admission, improved with IV fluids, it is 1.1 on discharge  Discharge Condition:  stable   Follow UP  Contact information for after-discharge care    Destination    [facility]erred SNF .   Service:  Skilled Nursing Contact information: [address] [place] [id number] [phone]                Discharge Instructions  and  Discharge Medications     Discharge Instructions    Discharge instructions   Complete by:  As directed    Follow with Primary MD Bernette Redbirdosenthal, Amy, MD in 7 days   Get CBC, CMP,  checked  by Primary MD next visit.    Activity: As tolerated with Full fall precautions use walker/cane & assistance as needed   Disposition SNF   Diet: Regular diet , with feeding assistance and aspiration precautions.  On your next visit with your primary care physician please Get Medicines reviewed and adjusted.   Please request your Prim.MD to go over all Hospital Tests and Procedure/Radiological results at the follow up, please get all Hospital records sent to your Prim MD by signing hospital release before you go home.   If you experience worsening of your  admission symptoms, develop shortness of breath, life threatening emergency, suicidal or homicidal thoughts you must seek medical attention immediately by calling 911 or calling your MD immediately  if symptoms less severe.  You Must read complete instructions/literature along with all the possible adverse reactions/side effects for all the Medicines you take and that have been prescribed to you. Take any new Medicines after you have completely understood and accpet all the possible adverse reactions/side effects.   Do not drive, operating heavy machinery, perform activities at heights, swimming or participation in water activities or provide baby sitting services if your were admitted for syncope or siezures until you have seen by Primary MD or a Neurologist and advised to do so again.  Do not drive when taking Pain medications.    Do not take more than prescribed Pain, Sleep and Anxiety Medications  Special Instructions: If you have smoked or chewed Tobacco  in the last 2 yrs please stop smoking, stop any regular Alcohol  and or any Recreational drug use.  Wear Seat belts while driving.   Please note  You were cared for by a hospitalist during your hospital stay. If you have any questions about your discharge medications or the care you received while you were in the hospital after you are discharged, you can call the unit and asked to speak with the hospitalist on call if the hospitalist that took care of you is not available. Once you are discharged, your primary care physician will handle any further medical issues. Please note that NO REFILLS for any discharge medications will be authorized once you are discharged, as it is imperative that you return to your primary care physician (or establish a relationship with a primary care physician if you do not have one) for your aftercare needs so that they can reassess your need for medications and monitor your lab values.   Increase activity  slowly   Complete by:  As directed      Allergies as of 05/08/2018   No Known Allergies     Medication List    STOP taking these medications  atorvastatin 40 MG tablet Commonly known as:  LIPITOR   fludrocortisone 0.1 MG tablet Commonly known as:  FLORINEF   pilocarpine 5 MG tablet Commonly known as:  SALAGEN   sertraline 50 MG tablet Commonly known as:  ZOLOFT   traMADol 50 MG tablet Commonly known as:  ULTRAM     TAKE these medications   acetaminophen 500 MG tablet Commonly known as:  TYLENOL Take 1,000 mg by mouth every 8 (eight) hours as needed for mild pain.   acetaminophen 325 MG tablet Commonly known as:  TYLENOL Take 650 mg by mouth daily.   ARIPiprazole 2 MG tablet Commonly known as:  ABILIFY Take 2 mg by mouth daily.   aspirin EC 81 MG tablet Take 81 mg by mouth every evening.   clobetasol 0.05 % external solution Commonly known as:  TEMOVATE Apply 1 application topically 2 (two) times daily as needed for rash. Apply to scalp   divalproex 250 MG DR tablet Commonly known as:  DEPAKOTE Take 250 mg by mouth daily.   divalproex 500 MG DR tablet Commonly known as:  DEPAKOTE Take 500 mg by mouth at bedtime.   docusate sodium 100 MG capsule Commonly known as:  COLACE Take 1 capsule (100 mg total) by mouth 2 (two) times daily.   fluticasone 50 MCG/ACT nasal spray Commonly known as:  FLONASE Place 1 spray into both nostrils daily. What changed:  when to take this   Glucosamine Sulfate 500 MG Caps Take 1,500 mg by mouth daily.   meclizine 12.5 MG tablet Commonly known as:  ANTIVERT Take 12.5 mg by mouth 3 (three) times daily.   meclizine 12.5 MG tablet Commonly known as:  ANTIVERT Take 12.5 mg by mouth 2 (two) times daily as needed for dizziness.   Melatonin 5 MG Caps Take 5 mg by mouth at bedtime.   methocarbamol 500 MG tablet Commonly known as:  ROBAXIN Take 1 tablet (500 mg total) by mouth every 8 (eight) hours as needed for muscle  spasms.   multivitamin with minerals Tabs tablet Take 1 tablet by mouth every evening.   OLANZapine 2.5 MG tablet Commonly known as:  ZYPREXA Take 1 tablet (2.5 mg total) by mouth every evening. What changed:  when to take this   polyethylene glycol packet Commonly known as:  MIRALAX / GLYCOLAX Take 17 g by mouth daily as needed. What changed:    reasons to take this  additional instructions   senna 8.6 MG Tabs tablet Commonly known as:  SENOKOT Take 1 tablet (8.6 mg total) by mouth daily. What changed:    when to take this  reasons to take this   traZODone 50 MG tablet Commonly known as:  DESYREL Take 25 mg by mouth at bedtime.   traZODone 50 MG tablet Commonly known as:  DESYREL Take 12.5 mg by mouth 2 (two) times daily.         Diet and Activity recommendation: See Discharge Instructions above   Consults obtained -  none   Major procedures and Radiology Reports - PLEASE review detailed and final reports for all details, in brief -      Dg Chest 2 View  Result Date: 05/07/2018 CLINICAL DATA:  Syncope. EXAM: CHEST - 2 VIEW COMPARISON:  Radiographs of March 20, 2017. FINDINGS: The heart size and mediastinal contours are within normal limits. No pneumothorax or pleural effusion is noted. Atherosclerosis of thoracic aorta is noted. Status post coronary bypass graft. Stable bibasilar scarring is noted. No  acute pulmonary disease is noted. Stable old lower thoracic vertebral body compression fracture. IMPRESSION: No active cardiopulmonary disease. Aortic Atherosclerosis (ICD10-I70.0). Electronically Signed   By: Lupita Raider, M.D.   On: 05/07/2018 12:18   Ct Head Wo Contrast  Result Date: 05/07/2018 CLINICAL DATA:  C-spine trauma, high clinical risk. Patient at skilled nursing facility with syncopal episode. History of dementia. EXAM: CT HEAD WITHOUT CONTRAST CT CERVICAL SPINE WITHOUT CONTRAST TECHNIQUE: Multidetector CT imaging of the head and cervical  spine was performed following the standard protocol without intravenous contrast. Multiplanar CT image reconstructions of the cervical spine were also generated. COMPARISON:  03/20/2017. FINDINGS: CT HEAD FINDINGS Brain: No evidence of acute stroke, acute hemorrhage, mass lesion, or extra-axial fluid. Generalized atrophy. Extensive hypoattenuation of white matter, representing small vessel disease. Old LEFT basal ganglia infarct. Old RIGHT occipital infarct. Vascular: Calcification of the cavernous internal carotid arteries consistent with cerebrovascular atherosclerotic disease. No signs of intracranial large vessel occlusion. Skull: Calvarium intact. No fracture. Sinuses/Orbits: Unremarkable. Other: None. CT CERVICAL SPINE FINDINGS Alignment: Previous C5-C7 ACDF. Trace anterolisthesis at C4-5 and C7-T1, possibly adjacent segment disease. Skull base and vertebrae: No fracture. Pseudarthrosis C6-C7. Soft tissues and spinal canal: No prevertebral fluid or swelling. No visible canal hematoma. Disc levels: No canal hematoma. Suspected calcified protrusion C4-C5. Multilevel facet arthropathy. Upper chest: Emphysematous change. No upper rib fracture or pneumothorax. Biapical pleural thickening. Atherosclerosis. CABG. Other: None IMPRESSION: 1. Atrophy and small vessel disease. Remote infarcts. No skull fracture or intracranial hemorrhage. 2. No cervical spine fracture or traumatic subluxation. 3. Previous C5-C7 ACDF.  Chronic Pseudarthrosis C6-C7. Emphysema (ICD10-J43.9). Electronically Signed   By: Elsie Stain M.D.   On: 05/07/2018 14:51   Ct Cervical Spine Wo Contrast  Result Date: 05/07/2018 CLINICAL DATA:  C-spine trauma, high clinical risk. Patient at skilled nursing facility with syncopal episode. History of dementia. EXAM: CT HEAD WITHOUT CONTRAST CT CERVICAL SPINE WITHOUT CONTRAST TECHNIQUE: Multidetector CT imaging of the head and cervical spine was performed following the standard protocol without  intravenous contrast. Multiplanar CT image reconstructions of the cervical spine were also generated. COMPARISON:  03/20/2017. FINDINGS: CT HEAD FINDINGS Brain: No evidence of acute stroke, acute hemorrhage, mass lesion, or extra-axial fluid. Generalized atrophy. Extensive hypoattenuation of white matter, representing small vessel disease. Old LEFT basal ganglia infarct. Old RIGHT occipital infarct. Vascular: Calcification of the cavernous internal carotid arteries consistent with cerebrovascular atherosclerotic disease. No signs of intracranial large vessel occlusion. Skull: Calvarium intact. No fracture. Sinuses/Orbits: Unremarkable. Other: None. CT CERVICAL SPINE FINDINGS Alignment: Previous C5-C7 ACDF. Trace anterolisthesis at C4-5 and C7-T1, possibly adjacent segment disease. Skull base and vertebrae: No fracture. Pseudarthrosis C6-C7. Soft tissues and spinal canal: No prevertebral fluid or swelling. No visible canal hematoma. Disc levels: No canal hematoma. Suspected calcified protrusion C4-C5. Multilevel facet arthropathy. Upper chest: Emphysematous change. No upper rib fracture or pneumothorax. Biapical pleural thickening. Atherosclerosis. CABG. Other: None IMPRESSION: 1. Atrophy and small vessel disease. Remote infarcts. No skull fracture or intracranial hemorrhage. 2. No cervical spine fracture or traumatic subluxation. 3. Previous C5-C7 ACDF.  Chronic Pseudarthrosis C6-C7. Emphysema (ICD10-J43.9). Electronically Signed   By: Elsie Stain M.D.   On: 05/07/2018 14:51   Ct Angio Chest/abd/pel For Dissection W And/or W/wo  Result Date: 05/07/2018 CLINICAL DATA:  84 y/o F; syncopal episode. History of abdominal aortic aneurysm. EXAM: CT ANGIOGRAPHY CHEST, ABDOMEN AND PELVIS TECHNIQUE: Multidetector CT imaging through the chest, abdomen and pelvis was performed using the standard protocol during bolus administration  of intravenous contrast. Multiplanar reconstructed images and MIPs were obtained and  reviewed to evaluate the vascular anatomy. CONTRAST:  ISOVUE-370 IOPAMIDOL (ISOVUE-370) INJECTION 76% COMPARISON:  02/11/2017 CT chest. 01/29/2017 CT abdomen and pelvis. FINDINGS: CTA CHEST FINDINGS Cardiovascular: Mild cardiomegaly. Normal caliber thoracic aorta and main pulmonary artery. Severe mixed plaque of the aorta and severe calcification of coronary arteries. Status post CABG with saphenous grafts and LIMA. Descending thoracic aorta ectasia to 3.5 cm. Mediastinum/Nodes: No enlarged mediastinal, hilar, or axillary lymph nodes. Thyroid gland, trachea, and esophagus demonstrate no significant findings. Lungs/Pleura: Right lower lobe spiculated nodule measuring 19 x 16 mm (series 9, image 109) increased in size from prior studies. Multiple additional stable ground-glass and solid subcentimeter peripheral pulmonary nodules, fibrotic changes, right apical scarring, and emphysema. No pleural effusion or pneumothorax. Musculoskeletal: Pectus deformity, Haller index 2.9. healed median sternotomy. Stable T9 moderate compression deformity Review of the MIP images confirms the above findings. CTA ABDOMEN AND PELVIS FINDINGS VASCULAR Aorta: Severe mixed plaque of the aorta. Infrarenal abdominal aortic aneurysm measuring 5.9 x 6.8 cm (AP by ML series 11, image 85 and series 10, image 39). Celiac: Calcified plaque of celiac origin with mild 50% stenosis. SMA: Mixed plaque of proximal SMA with mild 50% stenosis. Renals: Mixed plaque of bilateral proximal renal arteries with severe stenosis, greater on the left. IMA: Not identified. Inflow: Severe calcific atherosclerosis with multiple segments of mild stenosis. Veins: No obvious venous abnormality within the limitations of this arterial phase study. Review of the MIP images confirms the above findings. NON-VASCULAR Hepatobiliary: No focal liver abnormality is seen. No gallstones, gallbladder wall thickening, or biliary dilatation. Pancreas: Unremarkable. No  pancreatic ductal dilatation or surrounding inflammatory changes. Spleen: Normal in size without focal abnormality. Adrenals/Urinary Tract: Adrenal glands are unremarkable. Left kidney atrophy. Right kidney lower pole subcentimeter simple cyst in left kidney interpole subcentimeter hemorrhagic cyst. No urinary stone disease or hydronephrosis. Normal bladder. Stomach/Bowel: Stomach is within normal limits. Appendix not identified, no pericecal inflammation. No evidence of bowel wall thickening, distention, or inflammatory changes. Lymphatic: Aortic atherosclerosis. No enlarged abdominal or pelvic lymph nodes. Reproductive: Status post hysterectomy. No adnexal masses. Other: No abdominal wall hernia or abnormality. No abdominopelvic ascites. Musculoskeletal: No fracture is seen. Review of the MIP images confirms the above findings. IMPRESSION: 1. No acute process identified. 2. Infrarenal abdominal aortic aneurysm measures 6.8 cm, previously 5.8 cm. Recommend followup by abdomen and pelvis CTA in 3-6 months, and vascular surgery referral/consultation if not already obtained. This recommendation follows ACR consensus guidelines: White Paper of the ACR Incidental Findings Committee II on Vascular Findings. J Am Coll Radiol 2013; 10:789-794. 3. Right lower lobe 19 mm spiculated nodule, increased in size from 10/18. Pulmonary malignancy is suspected. Consider PET-CT and/or tissue sampling. 4. Stable chronic moderate T9 compression deformity. 5. Severe aortic and branch vessel atherosclerosis. Status post CABG. 6. Aortic Atherosclerosis (ICD10-I70.0) and Emphysema (ICD10-J43.9). Electronically Signed   By: Mitzi Hansen M.D.   On: 05/07/2018 14:59    Micro Results     Recent Results (from the past 240 hour(s))  Urine culture     Status: Abnormal   Collection Time: 05/07/18  1:57 PM  Result Value Ref Range Status   Specimen Description URINE, RANDOM  Final   Special Requests   Final     NONE Performed at [facility], [address], [place], [place] [address]    Culture MULTIPLE SPECIES PRESENT, SUGGEST RECOLLECTION (A)  Final   Report  Status 05/08/2018 FINAL  Final       Today   Subjective:   Tonya Payne today has no headache,no chest abdominal pain,no new weakness tingling or numbness, feels much better  today.  Objective:   Blood pressure (!) 151/75, pulse 84, temperature 97.9 F (36.6 C), temperature source Oral, resp. rate 16, height 5\' 3"  (1.6 m), weight 47.5 kg, SpO2 99 %.   Intake/Output Summary (Last 24 hours) at 05/08/2018 1523 Last data filed at 05/08/2018 1337 Gross per 24 hour  Intake 1480 ml  Output 700 ml  Net 780 ml    Exam Awake Alert,No new F.N deficits, Normal affect Symmetrical Chest wall movement, Good air movement bilaterally, CTAB RRR,No Gallops,Rubs or new Murmurs, No Parasternal Heave +ve B.Sounds, Abd Soft, Non tender,  No rebound -guarding or rigidity. No Cyanosis, Clubbing or edema, No new Rash or bruise  Data Review   CBC w Diff:  Lab Results  Component Value Date   WBC 6.9 05/08/2018   HGB 10.1 (L) 05/08/2018   HCT 32.1 (L) 05/08/2018   PLT 159 05/08/2018   LYMPHOPCT 11 02/24/2017   MONOPCT 8 02/24/2017   EOSPCT 0 02/24/2017   BASOPCT 0 02/24/2017    CMP:  Lab Results  Component Value Date   NA 137 05/08/2018   NA 134 (A) 03/08/2016   K 4.0 05/08/2018   CL 108 05/08/2018   CO2 22 05/08/2018   BUN 19 05/08/2018   BUN 20 03/08/2016   CREATININE 1.16 (H) 05/08/2018   CREATININE 1.50 (H) 11/09/2015   GLU 139 03/08/2016   PROT 6.1 (L) 02/13/2017   ALBUMIN 3.1 (L) 02/13/2017   BILITOT 0.6 02/13/2017   ALKPHOS 80 02/13/2017   AST 29 02/13/2017   ALT 14 02/13/2017  .   Total Time in preparing paper work, data evaluation and todays exam - 30 minutes  Huey Bienenstock M.D on 05/08/2018 at 3:23 PM  [facility]   Office  [phone]

## 2018-05-15 ENCOUNTER — Non-Acute Institutional Stay: Payer: Medicare Other | Admitting: Primary Care

## 2018-05-15 DIAGNOSIS — Z515 Encounter for palliative care: Secondary | ICD-10-CM

## 2018-05-15 NOTE — Progress Notes (Signed)
Introduced self to patient and explained palliative care.  Patient appeared distressed and when asked would she like to continue she said no. Reported to SNF staff and will reassess at a later time.

## 2018-05-16 ENCOUNTER — Other Ambulatory Visit: Payer: Self-pay

## 2018-05-16 ENCOUNTER — Emergency Department (HOSPITAL_COMMUNITY)
Admission: EM | Admit: 2018-05-16 | Discharge: 2018-05-16 | Disposition: A | Discharge status: Home / Self Care | Payer: Medicare Other | Attending: Emergency Medicine | Admitting: Emergency Medicine

## 2018-05-16 DIAGNOSIS — I252 Old myocardial infarction: Secondary | ICD-10-CM | POA: Insufficient documentation

## 2018-05-16 DIAGNOSIS — J449 Chronic obstructive pulmonary disease, unspecified: Secondary | ICD-10-CM | POA: Diagnosis not present

## 2018-05-16 DIAGNOSIS — R1084 Generalized abdominal pain: Secondary | ICD-10-CM | POA: Diagnosis present

## 2018-05-16 DIAGNOSIS — N179 Acute kidney failure, unspecified: Secondary | ICD-10-CM | POA: Insufficient documentation

## 2018-05-16 DIAGNOSIS — Z79899 Other long term (current) drug therapy: Secondary | ICD-10-CM | POA: Diagnosis not present

## 2018-05-16 DIAGNOSIS — N3001 Acute cystitis with hematuria: Secondary | ICD-10-CM | POA: Diagnosis not present

## 2018-05-16 DIAGNOSIS — F0390 Unspecified dementia without behavioral disturbance: Secondary | ICD-10-CM | POA: Insufficient documentation

## 2018-05-16 DIAGNOSIS — Z87891 Personal history of nicotine dependence: Secondary | ICD-10-CM | POA: Insufficient documentation

## 2018-05-16 DIAGNOSIS — I4891 Unspecified atrial fibrillation: Secondary | ICD-10-CM | POA: Diagnosis not present

## 2018-05-16 LAB — URINALYSIS, ROUTINE W REFLEX MICROSCOPIC
BILIRUBIN URINE: NEGATIVE
Glucose, UA: NEGATIVE mg/dL
Ketones, ur: 5 mg/dL — AB
Nitrite: NEGATIVE
Protein, ur: NEGATIVE mg/dL
Specific Gravity, Urine: 1.015 (ref 1.005–1.030)
Trans Epithel, UA: 1
pH: 6 (ref 5.0–8.0)

## 2018-05-16 LAB — I-STAT CHEM 8, ED
BUN: 20 mg/dL (ref 8–23)
CALCIUM ION: 1.21 mmol/L (ref 1.15–1.40)
Chloride: 100 mmol/L (ref 98–111)
Creatinine, Ser: 1.3 mg/dL — ABNORMAL HIGH (ref 0.44–1.00)
Glucose, Bld: 112 mg/dL — ABNORMAL HIGH (ref 70–99)
HCT: 42 % (ref 36.0–46.0)
HEMOGLOBIN: 14.3 g/dL (ref 12.0–15.0)
Potassium: 4.5 mmol/L (ref 3.5–5.1)
Sodium: 134 mmol/L — ABNORMAL LOW (ref 135–145)
TCO2: 24 mmol/L (ref 22–32)

## 2018-05-16 LAB — CBC WITH DIFFERENTIAL/PLATELET
Abs Immature Granulocytes: 0.02 10*3/uL (ref 0.00–0.07)
Basophils Absolute: 0 10*3/uL (ref 0.0–0.1)
Basophils Relative: 0 %
EOS ABS: 0 10*3/uL (ref 0.0–0.5)
Eosinophils Relative: 0 %
HCT: 40.4 % (ref 36.0–46.0)
Hemoglobin: 12.7 g/dL (ref 12.0–15.0)
IMMATURE GRANULOCYTES: 0 %
Lymphocytes Relative: 20 %
Lymphs Abs: 1.7 10*3/uL (ref 0.7–4.0)
MCH: 30 pg (ref 26.0–34.0)
MCHC: 31.4 g/dL (ref 30.0–36.0)
MCV: 95.5 fL (ref 80.0–100.0)
Monocytes Absolute: 0.8 10*3/uL (ref 0.1–1.0)
Monocytes Relative: 10 %
Neutro Abs: 5.8 10*3/uL (ref 1.7–7.7)
Neutrophils Relative %: 70 %
Platelets: 207 10*3/uL (ref 150–400)
RBC: 4.23 MIL/uL (ref 3.87–5.11)
RDW: 13.8 % (ref 11.5–15.5)
WBC: 8.4 10*3/uL (ref 4.0–10.5)
nRBC: 0 % (ref 0.0–0.2)

## 2018-05-16 LAB — HEPATIC FUNCTION PANEL
ALT: 15 U/L (ref 0–44)
AST: 28 U/L (ref 15–41)
Albumin: 3.7 g/dL (ref 3.5–5.0)
Alkaline Phosphatase: 42 U/L (ref 38–126)
Bilirubin, Direct: 0.1 mg/dL (ref 0.0–0.2)
Indirect Bilirubin: 0.3 mg/dL (ref 0.3–0.9)
Total Bilirubin: 0.4 mg/dL (ref 0.3–1.2)
Total Protein: 7.1 g/dL (ref 6.5–8.1)

## 2018-05-16 LAB — I-STAT TROPONIN, ED: Troponin i, poc: 0 ng/mL (ref 0.00–0.08)

## 2018-05-16 LAB — LIPASE, BLOOD: Lipase: 32 U/L (ref 11–51)

## 2018-05-16 MED ORDER — SODIUM CHLORIDE 0.9 % IV BOLUS
1000.0000 mL | Freq: Once | INTRAVENOUS | Status: AC
  Administered 2018-05-16: 1000 mL via INTRAVENOUS

## 2018-05-16 MED ORDER — ONDANSETRON HCL 4 MG/2ML IJ SOLN
4.0000 mg | Freq: Once | INTRAMUSCULAR | Status: AC
  Administered 2018-05-16: 4 mg via INTRAVENOUS
  Filled 2018-05-16: qty 2

## 2018-05-16 MED ORDER — MORPHINE SULFATE (PF) 4 MG/ML IV SOLN
4.0000 mg | Freq: Once | INTRAVENOUS | Status: AC
  Administered 2018-05-16: 4 mg via INTRAVENOUS
  Filled 2018-05-16: qty 1

## 2018-05-16 MED ORDER — IOPAMIDOL (ISOVUE-370) INJECTION 76%
INTRAVENOUS | Status: AC
  Filled 2018-05-16: qty 100

## 2018-05-16 MED ORDER — CEPHALEXIN 250 MG PO CAPS: 250.0000 mg | ORAL_CAPSULE | Freq: Two times a day (BID) | ORAL | 0 refills | Status: AC

## 2018-05-16 NOTE — ED Provider Notes (Signed)
Care handoff received from Fayrene Helper, PA-C.  Please see his note for full details of visit.  In short patient is 84 year old female with history of dementia presenting today for low blood pressure reading from nursing home.  Patient without hypotension throughout EMS evaluation and visit here in emergency department work-up is unremarkable.  Plan at care handoff is to await urinalysis, treat any infections urine, discharge back to facility.   Of note patient with known abdominal aortic aneurysm, felt to be inoperable and family does not wish to have further evaluation of this per previous notes, see Dr Randol Kern note on 05/08/2018.  Patient's mild abdominal pain not felt to be dissection today.  Pedal pulses intact and equal bilaterally. No imaging indicated at this time.  Patient was also seen by Dr. Lockie Mola during this visit who agrees with care plan of discharge after urinalysis. Physical Exam  BP 129/88   Pulse 96   Temp 97.8 F (36.6 C) (Oral)   Resp 19   SpO2 98%   Physical Exam Constitutional:      General: She is not in acute distress.    Appearance: She is well-developed.  HENT:     Head: Normocephalic and atraumatic.     Right Ear: External ear normal.     Left Ear: External ear normal.     Nose: Nose normal.  Eyes:     Pupils: Pupils are equal, round, and reactive to light.  Neck:     Musculoskeletal: Normal range of motion.     Trachea: Trachea normal. No tracheal deviation.  Cardiovascular:     Rate and Rhythm: Normal rate and regular rhythm.     Heart sounds: Normal heart sounds.  Pulmonary:     Effort: Pulmonary effort is normal. No respiratory distress.  Abdominal:     Palpations: Abdomen is soft.     Tenderness: There is no abdominal tenderness. There is no guarding or rebound.  Musculoskeletal: Normal range of motion.  Feet:     Right foot:     Protective Sensation: 3 sites tested. 3 sites sensed.     Left foot:     Protective Sensation: 3 sites tested. 3  sites sensed.  Skin:    General: Skin is warm and dry.  Neurological:     General: No focal deficit present.     Mental Status: She is alert.     Comments: Alert, pleasant and oriented to self Moving all extremities spontaneously and following commands without difficulty  Psychiatric:        Behavior: Behavior normal.    ED Course/Procedures   Clinical Course as of May 16 1649  Sat May 16, 2018  1629 Keflex 250 BID x 5days; CrCl and dosing discussed and agreed with Dr. Lockie Mola.   [BM]    Clinical Course User Index [BM] Bill Salinas, PA-C    Procedures  MDM   Troponin negative Chem-8 with elevated creatinine appears baseline LFTs within normal limits Patient within normal limits CBC within normal limits EKG without acute changes read by Dr. Lockie Mola Urinalysis suspicious for urinary tract infection versus contaminated urine, urine culture sent  Creatinine clearance calculated with Cockcroft-Gault equation, creatinine clearance of 25 mL/min, no impairment dosing for Keflex shows that patient can tolerate 250 mg q. 8-12 hours.  Discussed with Dr. Lockie Mola, will prescribe patient Keflex 250 mg twice daily x5 days for possible UTI.  AVS encourages facility to have patient drink plenty of water. --------------- 4:50 PM: Patient reevaluated at  discharge, she is sleeping comfortably and in no acute distress.  Easily arousable to voice.  Denying any and all pain and states that she feels well.  Abdomen is nontender.  Patient moving all extremities spontaneously and without distress.  Physical exam unremarkable, sensation equal to all extremities, distal pulses intact and equal bilaterally.  Vital signs are stable, afebrile, not tachycardic, not hypotensive with SPO2 of 98% on room air.  Patient has been discharged back to her facility in good condition.  Case rediscussed with Dr. Lockie Molauratolo at discharge who agrees with plan of care at this time.  At this time there does not appear  to be any evidence of an acute emergency medical condition and the patient appears stable for discharge with appropriate outpatient follow up.  Diagnosis and care plan discussed with the patient, due to her history of dementia this is also been specified on after visit summary which is been sent with patient back to nursing home facility.  Encouraged PCP follow-up within 1 week. All questions answered.  Patient seen and evaluated by Dr. Lockie Molauratolo who agrees with discharge back to facility with keflex 250mg  BID at this time.  Note: Portions of this report may have been transcribed using voice recognition software. Every effort was made to ensure accuracy; however, inadvertent computerized transcription errors may still be present.   Bill SalinasMorelli, Ashtin Rosner A, PA-C 05/16/18 1708    Virgina Norfolkuratolo, Adam, DO 05/16/18 1723

## 2018-05-16 NOTE — ED Triage Notes (Addendum)
Pt brought in by ems from [facility] ; ems was called out for pt being hypotensive per facility bp was 78/54; upon ems arrival pts bp was 138/96 with a second reading of 128/92 ; CBG 128 pt c/o generalized abd pain and nausea x 2 days ; denies any active vomiting ; pt alert and oriented x2 ; pt has hx of dementia; pt recently discharged from hospital x 2days ago , unknown cause for hospitalization

## 2018-05-16 NOTE — ED Notes (Signed)
Pt called out to state "I'm in a room across from a room where I can see people coming out of. I need to get out of here and I also need to pee." Purewick applied to Pt at this time, RN aware.  PA at bedside at this time.

## 2018-05-16 NOTE — ED Provider Notes (Signed)
[facility] EMERGENCY DEPARTMENT Provider Note   CSN: [id number] Arrival date & time: 05/16/18  1337     History   Chief Complaint Chief Complaint  Patient presents with  . Abdominal Pain  . Nausea    HPI Sherre Wooton is a 84 y.o. female.  The history is provided by the patient and medical records. No language interpreter was used.  Abdominal Pain     84 year old female with history of AAA, prior MI, atrial fibrillation, COPD, IBS, dementia brought here via EMS from nursing facility for evaluation abdominal pain.  EMS was called out for patient being hypotensive per facility.  Her blood pressure was noted to be 78/54.  Upon EMS arrival, blood pressure was normotensive at 138/96 and a second reading of 128/92.  A CBG was 128.  Patient does complain of abdominal pain and nausea for the past 2 days without any vomiting diarrhea constipation.  She does not complain of any significant chest pain.  She points to her right lower quadrant to localize her pain.  She cannot describe her pain.  She denies any fever or chills.  Patient was recently admitted to the hospital on January 2 and discharged on January 3 for syncope.  Patient was noted to have a AAA greater than 5 cm.  She was also noted to be orthostatic and hypotensive during that hospitalization.  She was given IV fluid overnight  History is limited, lab for 5 caveats applied    Past Medical History:  Diagnosis Date  . AAA (abdominal aortic aneurysm) (HCC)   . AAA (abdominal aortic aneurysm) without rupture (HCC) 2016   CTA 10/23/2015 - saccular infrarenal aneurysm roughly 4.8 cm in diameter. Stable from 2016 -- Dr. Darrick Penna  . Anterior wall myocardial infarction (HCC) 10/08/2015   Presented with severe chest pain and dynamic anterior ST elevations/biphasic elevations area did not meet full criteria for STEMI, and troponin was not dramatically elevated. 95% LAD treated with PTCA followed by CABG  . Anxiety   .  Atrial fibrillation, new onset (HCC) 10/2015   after CABG, in SR at d/c  . CAD (coronary artery disease), native coronary artery 10/08/2015   Severe coronary disease with calcified left main and LAD: 5% left main going into ostial LAD. Mid LAD 95% (treated with PTCA). Proximal RCA 50%, calcified. EF was 20-25% with apical akinesis and distal anterior hypokinesis. --> Referred for CABG  . CAP (community acquired pneumonia) 07/2013   Hattie Perch 07/09/2013  . Congestive dilated cardiomyopathy (HCC) 10/08/2015   Intra-Op TEE showed EF of 40 and 45%. She had an echo with a "normal LV function "documented, but there was no reading M.D.  . COPD (chronic obstructive pulmonary disease) (HCC)   . Depression   . Headache    "weekly" (12/31/2016)  . Hyperlipidemia LDL goal <70 10/08/2015  . IBS (irritable bowel syndrome)   . Migraine    "none in years" (12/31/2016)  . Oral thrush   . Post PTCA 10/08/2015   Presented with STEMI, severe mid LAD lesion treated with PTCA and then referred for CABG.  . Syncope   . Syncope and collapse 05/07/2018  . TIA (transient ischemic attack)     Patient Active Problem List   Diagnosis Date Noted  . Syncope 05/07/2018  . Syncope due to autonomic failure 02/25/2017  . Hyperglycemia 02/24/2017  . Diarrhea 02/24/2017  . Compression fx, lumbar spine (HCC) 02/24/2017  . Constipation 02/02/2017  . Hypomagnesemia 01/31/2017  .  Orthostatic hypotension 01/31/2017  . Syncope and collapse 01/29/2017  . Nausea vomiting and diarrhea 01/29/2017  . Hypertensive urgency 01/29/2017  . COPD (chronic obstructive pulmonary disease) (HCC) 12/30/2016  . Closed compression fracture of thoracic vertebra (HCC) 12/30/2016  . Abdominal pain 12/30/2016  . Alcohol use 12/30/2016  . CAD (coronary artery disease) 12/30/2016  . Scalp laceration   . Acute renal failure superimposed on stage 3 chronic kidney disease (HCC)   . Dehydration   . Physical deconditioning   . Pressure injury of skin  11/12/2016  . CKD (chronic kidney disease), stage III (HCC) 09/24/2016  . Closed left hip fracture, with routine healing, subsequent encounter 09/23/2016  . Hyponatremia 09/23/2016  . Hypertension 09/23/2016  . Left displaced femoral neck fracture (HCC)   . Closed fracture of multiple pubic rami, right, sequela 02/27/2016  . Displaced fracture of neck of right second metacarpal bone with routine healing 02/27/2016  . Protein-calorie malnutrition, severe (HCC) 02/22/2016  . Orthostatic dizziness 02/22/2016  . Postoperative atrial fibrillation (HCC) 02/22/2016  . Fall   . Hand fracture   . Pelvic fracture (HCC) 01/26/2016  . AAA (abdominal aortic aneurysm) (HCC) 11/30/2015  . Post PTCA   . S/P CABG x 3 10/16/2015  . Coronary artery disease involving native coronary artery with angina pectoris (HCC) 10/08/2015  . Congestive dilated cardiomyopathy (HCC) 10/08/2015  . Hyperlipidemia LDL goal <70 10/08/2015  . E. coli UTI (urinary tract infection) 10/06/2015  . Myocardial infarction involving left anterior descending (LAD) coronary artery (HCC) 10/06/2015    Past Surgical History:  Procedure Laterality Date  . ABDOMINAL HYSTERECTOMY    . BACK SURGERY    . CARDIAC CATHETERIZATION N/A 10/06/2015   Procedure: Left Heart Cath and Coronary Angiography;  Surgeon: Marykay Lexavid W Harding, MD;  [facility];  Service: Cardiovascular;: Heavily calcified left main-LAD. 75% LM & Ost LAD, 95% d-mLAD, p-mRCA Calcified 50%. EF ~20-25%  . CARDIAC CATHETERIZATION N/A 10/06/2015   Procedure: Coronary Balloon Angioplasty;  Surgeon: Marykay Lexavid W Harding, MD;  [facility];  Service: Cardiovascular: PTCA of 95% LAD in setting of STEMI - reduced to 70% --> would need Atherectomy-PCI vs. CABG.  Sent for CABG.  . COLECTOMY    . CORONARY ARTERY BYPASS GRAFT N/A 10/16/2015   Procedure: CORONARY ARTERY BYPASS GRAFTING (CABG)  x three, using left internal mammary artery and right leg greater saphenous  vein harvested endoscopically;  Surgeon: Loreli SlotSteven C Hendrickson, MD;  [facility];  Service: Open Heart Surgery;  Laterality: N/A;  . FRACTURE SURGERY    . HIP PINNING,CANNULATED Left 09/24/2016   Procedure: CANNULATED HIP PINNING LEFT HIP;  Surgeon: Durene Romanslin, Matthew, MD;  Location: [facility] ORS;  Service: Orthopedics;  Laterality: Left;  . LUMBAR DISC SURGERY     L3,4,5; S1"  . NECK SURGERY    . TEE WITHOUT CARDIOVERSION N/A 10/16/2015   Procedure: TRANSESOPHAGEAL ECHOCARDIOGRAM (TEE);  Surgeon: Loreli SlotSteven C Hendrickson, MD;  [facility];  Service: Open Heart Surgery - IntraOp:  EF 40-45%.  . TONSILLECTOMY    . TOTAL HIP ARTHROPLASTY Right      OB History   No obstetric history on file.      Home Medications    Prior to Admission medications   Medication Sig Start Date End Date Taking? Authorizing Provider  acetaminophen (TYLENOL) 325 MG tablet Take 650 mg by mouth daily.    [provider]  acetaminophen (TYLENOL) 500 MG tablet Take 1,000 mg by mouth every  8 (eight) hours as needed for mild pain.    [provider]  ARIPiprazole (ABILIFY) 2 MG tablet Take 2 mg by mouth daily.    [provider]  aspirin EC 81 MG tablet Take 81 mg by mouth every evening.     [provider]  clobetasol (TEMOVATE) 0.05 % external solution Apply 1 application topically 2 (two) times daily as needed for rash. Apply to scalp 02/17/18   [provider]  divalproex (DEPAKOTE) 250 MG DR tablet Take 250 mg by mouth daily.    [provider]  divalproex (DEPAKOTE) 500 MG DR tablet Take 500 mg by mouth at bedtime.    [provider]  docusate sodium (COLACE) 100 MG capsule Take 1 capsule (100 mg total) by mouth 2 (two) times daily. 09/24/16   Lanney Gins, PA-C  fluticasone (FLONASE) 50 MCG/ACT nasal spray Place 1 spray into both nostrils daily. Patient taking differently: Place 1 spray into both nostrils at bedtime.  11/16/16   Vassie Loll, MD    Glucosamine Sulfate 500 MG CAPS Take 1,500 mg by mouth daily.    [provider]  meclizine (ANTIVERT) 12.5 MG tablet Take 12.5 mg by mouth 3 (three) times daily.    [provider]  meclizine (ANTIVERT) 12.5 MG tablet Take 12.5 mg by mouth 2 (two) times daily as needed for dizziness.    [provider]  Melatonin 5 MG CAPS Take 5 mg by mouth at bedtime.    [provider]  methocarbamol (ROBAXIN) 500 MG tablet Take 1 tablet (500 mg total) by mouth every 8 (eight) hours as needed for muscle spasms. 01/01/17   Maxie Barb, MD  Multiple Vitamin (MULTIVITAMIN WITH MINERALS) TABS tablet Take 1 tablet by mouth every evening.    [provider]  OLANZapine (ZYPREXA) 2.5 MG tablet Take 1 tablet (2.5 mg total) by mouth every evening. Patient taking differently: Take 2.5 mg by mouth at bedtime.  02/14/17   Rolly Salter, MD  polyethylene glycol [facility] / Ethelene Hal) packet Take 17 g by mouth daily as needed. Patient taking differently: Take 17 g by mouth daily as needed for mild constipation. Mix in 4-8 oz liquid and drink 01/01/17   Maxie Barb, MD  senna (SENOKOT) 8.6 MG TABS tablet Take 1 tablet (8.6 mg total) by mouth daily. Patient taking differently: Take 1 tablet by mouth 2 (two) times daily as needed for mild constipation.  02/27/17   Regalado, Belkys A, MD  traZODone (DESYREL) 50 MG tablet Take 25 mg by mouth at bedtime.    [provider]  traZODone (DESYREL) 50 MG tablet Take 12.5 mg by mouth 2 (two) times daily.    [provider]    Family History Family History  Problem Relation Age of Onset  . Stroke Mother        dead  . Clotting disorder Father        dead  bone marrow disease    Social History Social History   Tobacco Use  . Smoking status: Former Smoker    Packs/day: 0.50    Years: 50.00    Pack years: 25.00    Types: Cigarettes    Last attempt to quit: 04/11/2015    Years since quitting:  3.0  . Smokeless tobacco: Never Used  Substance Use Topics  . Alcohol use: Yes    Comment: 12/31/2016 "glass of wine maybe once/month"  . Drug use: No     Allergies  Patient has no known allergies.   Review of Systems Review of Systems  Unable to perform ROS: Dementia  Gastrointestinal: Positive for abdominal pain.     Physical Exam Updated Vital Signs BP 114/75   Pulse (!) 107   Temp 97.8 F (36.6 C) (Oral)   Resp 15   SpO2 98%   Physical Exam Vitals signs and nursing note reviewed.  Constitutional:      General: She is not in acute distress.    Appearance: She is well-developed.     Comments: Elderly demented female in no acute discomfort.  HENT:     Head: Atraumatic.  Eyes:     Conjunctiva/sclera: Conjunctivae normal.  Neck:     Musculoskeletal: Neck supple.  Cardiovascular:     Rate and Rhythm: Tachycardia present. Rhythm irregular.     Pulses: Normal pulses.  Pulmonary:     Effort: Pulmonary effort is normal.     Breath sounds: Normal breath sounds.  Abdominal:     General: There is no distension.     Palpations: Abdomen is soft.     Tenderness: There is generalized abdominal tenderness.  Skin:    Findings: No rash.  Neurological:     Mental Status: She is alert.     Comments: Alert and oriented to self and place but not to time or situation  Psychiatric:        Mood and Affect: Mood normal.      ED Treatments / Results  Labs (all labs ordered are listed, but only abnormal results are displayed) Labs Reviewed  I-STAT CHEM 8, ED - Abnormal; Notable for the following components:      Result Value   Sodium 134 (*)    Creatinine, Ser 1.30 (*)    Glucose, Bld 112 (*)    All other components within normal limits  CBC WITH DIFFERENTIAL/PLATELET  LIPASE, BLOOD  HEPATIC FUNCTION PANEL  URINALYSIS, ROUTINE W REFLEX MICROSCOPIC  I-STAT TROPONIN, ED    EKG None   Date: 05/16/2018  Rate: 98  Rhythm: normal sinus rhythm  QRS Axis: normal   Intervals: normal  ST/T Wave abnormalities: normal  Conduction Disutrbances: none  Narrative Interpretation:   Old EKG Reviewed: No significant changes noted     Radiology No results found.  Procedures Procedures (including critical care time)  Medications Ordered in ED Medications  iopamidol (ISOVUE-370) 76 % injection (has no administration in time range)  sodium chloride 0.9 % bolus 1,000 mL (1,000 mLs Intravenous New Bag/Given 05/16/18 1510)  morphine 4 MG/ML injection 4 mg (4 mg Intravenous Given 05/16/18 1511)  ondansetron (ZOFRAN) injection 4 mg (4 mg Intravenous Given 05/16/18 1510)     Initial Impression / Assessment and Plan / ED Course  I have reviewed the triage vital signs and the nursing notes.  Pertinent labs & imaging results that were available during my care of the patient were reviewed by me and considered in my medical decision making (see chart for details).     BP 129/88   Pulse 96   Temp 97.8 F (36.6 C) (Oral)   Resp 19   SpO2 98%    Final Clinical Impressions(s) / ED Diagnoses   Final diagnoses:  AKI (acute kidney injury) [facility])    ED Discharge Orders    None     2:53 PM Patient with known history of AAA as large as 6.8 cm on a recent CT scan less than a week ago when she was hospitalized for syncope  in the setting of dehydration.  She is here due to report of hypotension and she also endorsed abdominal discomfort.  On exam, patient does have tenderness about her abdomen but she had intact distal pulses.  Her AAA's is deemed to be inoperable and family request no further evaluation for this condition at this time per prior note.  Plan to give IV fluid, pain medication, and check basic labs.   4:06 PM Labs are reassuring.  Mild AKI with Cr 1.5.  IVF given.  Currently awaits UA.  I have attempted to reach out to family members a few times without success.  Pt sign out to oncoming provider who will f/u on UA result.  Will d/c back to facility  pending UA result.  Care discussed with Dr. Lockie Mola.    Fayrene Helper, PA-C 05/16/18 1607    Curatolo, Adam, DO 05/16/18 1643

## 2018-05-16 NOTE — ED Notes (Signed)
Called [facility] for the 3rd time to give report , no answer

## 2018-05-16 NOTE — ED Notes (Signed)
Called [facility] 

## 2018-05-16 NOTE — ED Notes (Signed)
Pt was put on bedpan to collect urine. Culture collected and sent down to main lab with UA cup.

## 2018-05-16 NOTE — ED Notes (Signed)
Called [facility] to give report , no answer x 2

## 2018-05-16 NOTE — Discharge Instructions (Addendum)
You have evidence of mild dehydration.  Stay hydrated and follow up with your doctor for further care.  Your blood pressure is normal today.   Urinalysis today suspicious for urinary tract infection.  This has been sent for culture which will result in the next 3-4 days.  You may use the Keflex 250 mg 2 times daily for the next 5 days for possible infection.  You will be contacted by [facility] health if the culture results requiring a different antibiotic.  Please be sure to drink plenty water and get plenty of rest to help with your symptoms.  Get help right away if: You have very bad back pain. You have very bad pain in your lower belly. You have a fever. You are sick to your stomach (nauseous). You are throwing up. Get help right away if: You develop symptoms of worsening kidney disease, which include: Headaches. Abnormally dark or light skin. Easy bruising. Frequent hiccups. Chest pain. Shortness of breath. End of menstruation in women. Seizures. Confusion or altered mental status. Abdominal or back pain. Itchiness. You have a fever. Your body is producing less urine. You have pain or bleeding when you urinate.

## 2018-05-17 LAB — URINE CULTURE: Culture: >=100000 — AB

## 2018-05-22 ENCOUNTER — Non-Acute Institutional Stay: Payer: Medicare Other | Admitting: Primary Care

## 2018-05-22 DIAGNOSIS — R55 Syncope and collapse: Secondary | ICD-10-CM

## 2018-05-22 DIAGNOSIS — E43 Unspecified severe protein-calorie malnutrition: Secondary | ICD-10-CM

## 2018-05-22 DIAGNOSIS — Z515 Encounter for palliative care: Secondary | ICD-10-CM

## 2018-05-22 NOTE — Progress Notes (Signed)
[facility] Telephone: [phone] Fax: [phone]  PATIENT NAME: Tonya Payne DOB: Apr 08, 1935 MRN: [id number]  PRIMARY CARE PROVIDER:   Virgel Bouquet, MD  REFERRING PROVIDER:  Virgel Bouquet, MD [place], [place] [address]  RESPONSIBLE PARTY:   Extended Emergency Contact Information Primary Emergency Contact: Payne,Tonya D. Address: [address] [place], [place] [address] Johnnette Litter of [place] Phone: [phone] Relation: Son Secondary Emergency Contact: Payne,Tonya Address: [address] [place], [place] [address] Johnnette Litter of [place] Phone: [phone] Mobile Phone: [phone] Relation: Relative  Palliative care is seeing patient on referral from Dr. Aubery Payne. This is the initial visit. Met with patient in her room.   ASSESSMENT and RECOMMENDATIONS:   Depression/anxiety: Recommend relaxation modalities e.g. lavender aromatherapy, identifying a familiar and pleasurable activity.  She also states she enjoyed PMHNP visits in the past, and they worked on calming therapies. Music that she finds soothing, on an iPod, may address some agitation and memory loss.  Staff reports frequent request for doctor visits for hand shaking and anxiety. History of dementia with anxiety. Patient stated she moved to [place] to be near her only child, a son, who lives in [place]. 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 She cannot recall any hobbies that she enjoyed when she was younger or in better health. She did seem to enjoy learning how to use the remote to change TV channels today. She states that she is of the [place] but has not been to church for a long time. She states that it is not a part of her life that she misses.   Meds reviewed, patient states debilitating anxiety and shaking. Shaking seems intermittent, more pronounced when she's talking about it but not  when she's distracted  Goals of Care: DNR, limited scope MOST for in chart. Call placed to her POA son Tonya Payne to discuss goals of care.  No answer, voicemail left. Would like to speak with son to inform future recommendations.  Palliative care to continue to follow for goals of care clarification and symptom management. Return 4 weeks.   I spent 35 minutes providing this consultation,  from 1615 to 1650. More than 50% of the time in this consultation was spent coordinating communication.   HISTORY OF PRESENT ILLNESS:  Tonya Payne is a 84 y.o. year old female with multiple medical problems including history of AAA, prior MI, atrial fibrillation, COPD, IBS, dementia. Palliative Care was asked to help address goals of care.   CODE STATUS:  DNR, Limited MOST form (DNR, comfort only, abx, iv fluids, feeding tube for trial)  PPS: 30% HOSPICE ELIGIBILITY/DIAGNOSIS: TBD  PAST MEDICAL HISTORY:  Past Medical History:  Diagnosis Date  . AAA (abdominal aortic aneurysm) ([facility])   . AAA (abdominal aortic aneurysm) without rupture ([facility]) 2016   CTA 10/23/2015 - saccular infrarenal aneurysm roughly 4.8 cm in diameter. Stable from 2016 -- Dr. Oneida Alar  . Anterior wall myocardial infarction ([facility]) 10/08/2015   Presented with severe chest pain and dynamic anterior ST elevations/biphasic elevations area did not meet full criteria for STEMI, and troponin was not dramatically elevated. 95% LAD treated with PTCA followed by CABG  . Anxiety   . Atrial fibrillation, new onset ([facility]) 10/2015   after CABG, in SR at d/c  . CAD (coronary artery disease), native coronary artery 10/08/2015  Severe coronary disease with calcified left main and LAD: 5% left main going into ostial LAD. Mid LAD 95% (treated with PTCA). Proximal RCA 50%, calcified. EF was 20-25% with apical akinesis and distal anterior hypokinesis. --> Referred for CABG  . CAP (community acquired pneumonia) 07/2013   Archie Endo 07/09/2013  . Congestive dilated  cardiomyopathy ([facility]) 10/08/2015   Intra-Op TEE showed EF of 40 and 45%. She had an echo with a "normal LV function "documented, but there was no Payne M.D.  . COPD (chronic obstructive pulmonary disease) ([facility])   . Depression   . Headache    "weekly" (12/31/2016)  . Hyperlipidemia LDL goal <70 10/08/2015  . IBS (irritable bowel syndrome)   . Migraine    "none in years" (12/31/2016)  . Oral thrush   . Post PTCA 10/08/2015   Presented with STEMI, severe mid LAD lesion treated with PTCA and then referred for CABG.  . Syncope   . Syncope and collapse 05/07/2018  . TIA (transient ischemic attack)     SOCIAL HX:  Social History   Tobacco Use  . Smoking status: Former Smoker    Packs/day: 0.50    Years: 50.00    Pack years: 25.00    Types: Cigarettes    Last attempt to quit: 04/11/2015    Years since quitting: 3.1  . Smokeless tobacco: Never Used  Substance Use Topics  . Alcohol use: Yes    Comment: 12/31/2016 "glass of wine maybe once/month"    ALLERGIES: No Known Allergies   PERTINENT MEDICATIONS:  Outpatient Encounter Medications as of 05/22/2018  Medication Sig  . acetaminophen (TYLENOL) 500 MG tablet Take 1,000 mg by mouth every 12 (twelve) hours.   . ARIPiprazole (ABILIFY) 2 MG tablet Take 2 mg by mouth daily.  Marland Kitchen aspirin EC 81 MG tablet Take 81 mg by mouth every evening.   . clobetasol (TEMOVATE) 0.05 % external solution Apply 1 application topically 2 (two) times daily as needed (scalp psoriasis).   Marland Kitchen divalproex (DEPAKOTE) 250 MG DR tablet Take 250 mg by mouth daily.  . divalproex (DEPAKOTE) 500 MG DR tablet Take 500 mg by mouth at bedtime.  . docusate sodium (COLACE) 100 MG capsule Take 1 capsule (100 mg total) by mouth 2 (two) times daily.  . fluticasone (FLONASE) 50 MCG/ACT nasal spray Place 1 spray into both nostrils daily. (Patient taking differently: Place 2 sprays into both nostrils at bedtime. )  . Glucosamine Sulfate 500 MG CAPS Take 1,500 mg by mouth daily.  .  meclizine (ANTIVERT) 12.5 MG tablet Take 12.5 mg by mouth 3 (three) times daily. 8a, 1p, 5p  . Melatonin 5 MG TABS Take 5 mg by mouth at bedtime.   . methocarbamol (ROBAXIN) 500 MG tablet Take 1 tablet (500 mg total) by mouth every 8 (eight) hours as needed for muscle spasms.  . Multiple Vitamin (MULTIVITAMIN WITH MINERALS) TABS tablet Take 1 tablet by mouth every evening.  Marland Kitchen OLANZapine (ZYPREXA) 2.5 MG tablet Take 1 tablet (2.5 mg total) by mouth every evening. (Patient not taking: Reported on 05/16/2018)  . polyethylene glycol (MIRALAX / GLYCOLAX) packet Take 17 g by mouth daily as needed. (Patient taking differently: Take 17 g by mouth daily as needed (constipation). Mix in 4-8 oz liquid and drink)  . senna (SENOKOT) 8.6 MG TABS tablet Take 1 tablet (8.6 mg total) by mouth daily. (Patient not taking: Reported on 05/16/2018)  . senna-docusate (SENNA-PLUS) 8.6-50 MG tablet Take 1 tablet by mouth daily as needed (constipation).  Marland Kitchen  traZODone (DESYREL) 50 MG tablet Take 12.5-25 mg by mouth See admin instructions. Take 1/4 tablet (12.5 mg) by mouth twice daily (for anxiety) - 8am and 2pm, take 1/2 tablet (25 mg) at bedtime   No facility-administered encounter medications on file as of 05/22/2018.     PHYSICAL EXAM:  VS 98.1-88-18 70/52, 95% room air, wt 103  General: NAD, frail appearing, thin, uses medications to sleep Cardiovascular: regular rate and rhythm, S1S2 Pulmonary: clear all fields,Cough, former smoker x 10 years, remote Abdomen: soft, nontender, + bowel sounds, denies constipation. Pulsating mass palpated GU: no suprapubic tenderness, incontinence Extremities: no edema, no joint deformities Skin: no rashes, no wounds Neurological: Weakness , h/o collapse, anxiety, depression, memory loss, alert and oriented x 2-3.  Cyndia Skeeters DNP AGPCNP-BC

## 2018-05-29 ENCOUNTER — Emergency Department (HOSPITAL_COMMUNITY)
Admission: EM | Admit: 2018-05-29 | Discharge: 2018-05-29 | Disposition: A | Discharge status: Home / Self Care | Payer: Medicare Other | Attending: Emergency Medicine | Admitting: Emergency Medicine

## 2018-05-29 ENCOUNTER — Encounter (HOSPITAL_COMMUNITY): Payer: Self-pay | Admitting: Emergency Medicine

## 2018-05-29 ENCOUNTER — Other Ambulatory Visit: Payer: Self-pay

## 2018-05-29 DIAGNOSIS — N183 Chronic kidney disease, stage 3 (moderate): Secondary | ICD-10-CM | POA: Diagnosis not present

## 2018-05-29 DIAGNOSIS — Z87891 Personal history of nicotine dependence: Secondary | ICD-10-CM | POA: Insufficient documentation

## 2018-05-29 DIAGNOSIS — J449 Chronic obstructive pulmonary disease, unspecified: Secondary | ICD-10-CM | POA: Diagnosis not present

## 2018-05-29 DIAGNOSIS — Z79899 Other long term (current) drug therapy: Secondary | ICD-10-CM | POA: Insufficient documentation

## 2018-05-29 DIAGNOSIS — R42 Dizziness and giddiness: Secondary | ICD-10-CM | POA: Diagnosis not present

## 2018-05-29 DIAGNOSIS — Z7982 Long term (current) use of aspirin: Secondary | ICD-10-CM | POA: Diagnosis not present

## 2018-05-29 DIAGNOSIS — I129 Hypertensive chronic kidney disease with stage 1 through stage 4 chronic kidney disease, or unspecified chronic kidney disease: Secondary | ICD-10-CM | POA: Insufficient documentation

## 2018-05-29 LAB — URINALYSIS, ROUTINE W REFLEX MICROSCOPIC
Bilirubin Urine: NEGATIVE
Glucose, UA: NEGATIVE mg/dL
Hgb urine dipstick: NEGATIVE
Ketones, ur: 5 mg/dL — AB
Leukocytes, UA: NEGATIVE
Nitrite: NEGATIVE
Protein, ur: NEGATIVE mg/dL
Specific Gravity, Urine: 1.024 (ref 1.005–1.030)
pH: 5 (ref 5.0–8.0)

## 2018-05-29 LAB — CBC WITH DIFFERENTIAL/PLATELET
Abs Immature Granulocytes: 0.03 10*3/uL (ref 0.00–0.07)
Basophils Absolute: 0 10*3/uL (ref 0.0–0.1)
Basophils Relative: 0 %
Eosinophils Absolute: 0 10*3/uL (ref 0.0–0.5)
Eosinophils Relative: 0 %
HCT: 38.2 % (ref 36.0–46.0)
Hemoglobin: 11.5 g/dL — ABNORMAL LOW (ref 12.0–15.0)
Immature Granulocytes: 0 %
Lymphocytes Relative: 12 %
Lymphs Abs: 1.1 10*3/uL (ref 0.7–4.0)
MCH: 29.9 pg (ref 26.0–34.0)
MCHC: 30.1 g/dL (ref 30.0–36.0)
MCV: 99.2 fL (ref 80.0–100.0)
Monocytes Absolute: 0.6 10*3/uL (ref 0.1–1.0)
Monocytes Relative: 8 %
Neutro Abs: 6.8 10*3/uL (ref 1.7–7.7)
Neutrophils Relative %: 80 %
Platelets: 211 10*3/uL (ref 150–400)
RBC: 3.85 MIL/uL — ABNORMAL LOW (ref 3.87–5.11)
RDW: 14.6 % (ref 11.5–15.5)
WBC: 8.5 10*3/uL (ref 4.0–10.5)
nRBC: 0 % (ref 0.0–0.2)

## 2018-05-29 LAB — BASIC METABOLIC PANEL
Anion gap: 10 (ref 5–15)
BUN: 19 mg/dL (ref 8–23)
CO2: 22 mmol/L (ref 22–32)
Calcium: 9.4 mg/dL (ref 8.9–10.3)
Chloride: 104 mmol/L (ref 98–111)
Creatinine, Ser: 1.42 mg/dL — ABNORMAL HIGH (ref 0.44–1.00)
GFR calc Af Amer: 40 mL/min — ABNORMAL LOW (ref >60)
GFR calc non Af Amer: 35 mL/min — ABNORMAL LOW (ref >60)
Glucose, Bld: 94 mg/dL (ref 70–99)
Potassium: 5 mmol/L (ref 3.5–5.1)
Sodium: 136 mmol/L (ref 135–145)

## 2018-05-29 NOTE — ED Notes (Signed)
Report given to [facility]. 

## 2018-05-29 NOTE — ED Notes (Signed)
Pt placed on bedpan

## 2018-05-29 NOTE — ED Notes (Signed)
Pt placed on purewick 

## 2018-05-29 NOTE — ED Notes (Signed)
Lab at bedside now.  °

## 2018-05-29 NOTE — ED Notes (Signed)
Patient verbalizes understanding of discharge instructions. Opportunity for questioning and answers were provided. Armband removed by staff, pt discharged from ED via stretcher with PTAR.  

## 2018-05-29 NOTE — ED Triage Notes (Signed)
Pt presents to ED from [facility]. Pt complains of weakness for 3 weeks. Pt also states she is dizzy but has a history of this.   BP 106/70 HR 80 CBG 159 RR 16

## 2018-05-29 NOTE — ED Provider Notes (Signed)
[facility] Provider Note   CSN: [id number] Arrival date & time: 05/29/18  1128     History   Chief Complaint No chief complaint on file.   HPI Tonya AlmSuann Sloma is a 84 y.o. female.  HPI   84 year old female with dizziness.  She states that she woke up this morning feeling okay.  Began feeling dizzy later in the morning.  She has a hard time that describing her symptoms beyond this.  She denies room spinning sensation though.  No acute pain.  Feels generally weak.  No shortness of breath.  No urinary complaints.  Past Medical History:  Diagnosis Date  . AAA (abdominal aortic aneurysm) ([facility])   . AAA (abdominal aortic aneurysm) without rupture ([facility]) 2016   CTA 10/23/2015 - saccular infrarenal aneurysm roughly 4.8 cm in diameter. Stable from 2016 -- Dr. Darrick PennaFields  . Anterior wall myocardial infarction ([facility]) 10/08/2015   Presented with severe chest pain and dynamic anterior ST elevations/biphasic elevations area did not meet full criteria for STEMI, and troponin was not dramatically elevated. 95% LAD treated with PTCA followed by CABG  . Anxiety   . Atrial fibrillation, new onset ([facility]) 10/2015   after CABG, in SR at d/c  . CAD (coronary artery disease), native coronary artery 10/08/2015   Severe coronary disease with calcified left main and LAD: 5% left main going into ostial LAD. Mid LAD 95% (treated with PTCA). Proximal RCA 50%, calcified. EF was 20-25% with apical akinesis and distal anterior hypokinesis. --> Referred for CABG  . CAP (community acquired pneumonia) 07/2013   Hattie Perch/notes 07/09/2013  . Congestive dilated cardiomyopathy ([facility]) 10/08/2015   Intra-Op TEE showed EF of 40 and 45%. She had an echo with a "normal LV function "documented, but there was no reading M.D.  . COPD (chronic obstructive pulmonary disease) ([facility])   . Depression   . Headache    "weekly" (12/31/2016)  . Hyperlipidemia LDL goal <70 10/08/2015  . IBS (irritable bowel syndrome)     . Migraine    "none in years" (12/31/2016)  . Oral thrush   . Post PTCA 10/08/2015   Presented with STEMI, severe mid LAD lesion treated with PTCA and then referred for CABG.  . Syncope   . Syncope and collapse 05/07/2018  . TIA (transient ischemic attack)     Patient Active Problem List   Diagnosis Date Noted  . Syncope 05/07/2018  . Syncope due to autonomic failure 02/25/2017  . Hyperglycemia 02/24/2017  . Diarrhea 02/24/2017  . Compression fx, lumbar spine ([facility]) 02/24/2017  . Constipation 02/02/2017  . Hypomagnesemia 01/31/2017  . Orthostatic hypotension 01/31/2017  . Syncope and collapse 01/29/2017  . Nausea vomiting and diarrhea 01/29/2017  . Hypertensive urgency 01/29/2017  . COPD (chronic obstructive pulmonary disease) ([facility]) 12/30/2016  . Closed compression fracture of thoracic vertebra (HCC) 12/30/2016  . Abdominal pain 12/30/2016  . Alcohol use 12/30/2016  . CAD (coronary artery disease) 12/30/2016  . Scalp laceration   . Acute renal failure superimposed on stage 3 chronic kidney disease (HCC)   . Dehydration   . Physical deconditioning   . Pressure injury of skin 11/12/2016  . CKD (chronic kidney disease), stage III ([facility]CC) 09/24/2016  . Closed left hip fracture, with routine healing, subsequent encounter 09/23/2016  . Hyponatremia 09/23/2016  . Hypertension 09/23/2016  . Left displaced femoral neck fracture (HCC)   . Closed fracture of multiple pubic rami, right, sequela 02/27/2016  . Displaced fracture of neck of right  second metacarpal bone with routine healing 02/27/2016  . Protein-calorie malnutrition, severe (HCC) 02/22/2016  . Orthostatic dizziness 02/22/2016  . Postoperative atrial fibrillation (HCC) 02/22/2016  . Fall   . Hand fracture   . Pelvic fracture (HCC) 01/26/2016  . AAA (abdominal aortic aneurysm) ([facility]) 11/30/2015  . Post PTCA   . S/P CABG x 3 10/16/2015  . Coronary artery disease involving native coronary artery with angina pectoris (HCC)  10/08/2015  . Congestive dilated cardiomyopathy (H[facility]) 10/08/2015  . Hyperlipidemia LDL goal <70 10/08/2015  . E. coli UTI (urinary tract infection) 10/06/2015  . Myocardial infarction involving left anterior descending (LAD) coronary artery (HCC) 10/06/2015    Past Surgical History:  Procedure Laterality Date  . ABDOMINAL HYSTERECTOMY    . BACK SURGERY    . CARDIAC CATHETERIZATION N/A 10/06/2015   Procedure: Left Heart Cath and Coronary Angiography;  Surgeon: Marykay Lex, MD;  Location: [facility] INVASIVE CV LAB;  Service: Cardiovascular;: Heavily calcified left main-LAD. 75% LM & Ost LAD, 95% d-mLAD, p-mRCA Calcified 50%. EF ~20-25%  . CARDIAC CATHETERIZATION N/A 10/06/2015   Procedure: Coronary Balloon Angioplasty;  Surgeon: Marykay Lex, MD;  Location[facility] INVASIVE CV LAB;  Service: Cardiovascular: PTCA of 95% LAD in setting of STEMI - reduced to 70% --> would need Atherectomy-PCI vs. CABG.  Sent for CABG.  . COLECTOMY    . CORONARY ARTERY BYPASS GRAFT N/A 10/16/2015   Procedure: CORONARY ARTERY BYPASS GRAFTING (CABG)  x three, using left internal mammary artery and right leg greater saphenous vein harvested endoscopically;  Surgeon: Loreli Slot, MD;  Location: [facility] OR;  Service: Open Heart Surgery;  Laterality: N/A;  . FRACTURE SURGERY    . HIP PINNING,CANNULATED Left 09/24/2016   Procedure: CANNULATED HIP PINNING LEFT HIP;  Surgeon: Durene Romans, MD;  Location: [facility] ORS;  Service: Orthopedics;  Laterality: Left;  . LUMBAR DISC SURGERY     L3,4,5; S1"  . NECK SURGERY    . TEE WITHOUT CARDIOVERSION N/A 10/16/2015   Procedure: TRANSESOPHAGEAL ECHOCARDIOGRAM (TEE);  Surgeon: Loreli Slot, MD;  [facility];  Service: Open Heart Surgery - IntraOp:  EF 40-45%.  . TONSILLECTOMY    . TOTAL HIP ARTHROPLASTY Right      OB History   No obstetric history on file.      Home Medications    Prior to Admission medications   Medication Sig Start Date End Date Taking? Authorizing  Provider  acetaminophen (TYLENOL) 500 MG tablet Take 1,000 mg by mouth every 12 (twelve) hours.    Yes [provider]  ARIPiprazole (ABILIFY) 2 MG tablet Take 2 mg by mouth daily.   Yes [provider]  aspirin EC 81 MG tablet Take 81 mg by mouth every evening.    Yes [provider]  clobetasol (TEMOVATE) 0.05 % external solution Apply 1 application topically 2 (two) times daily as needed (scalp psoriasis).  02/17/18  Yes [provider]  divalproex (DEPAKOTE) 250 MG DR tablet Take 250 mg by mouth daily.   Yes [provider]  divalproex (DEPAKOTE) 500 MG DR tablet Take 500 mg by mouth at bedtime.   Yes [provider]  Glucosamine Sulfate 500 MG CAPS Take 1,500 mg by mouth daily.   Yes [provider]  meclizine (ANTIVERT) 12.5 MG tablet Take 12.5 mg by mouth 3 (three) times daily. 8a, 1p, 5p   Yes [provider]  Melatonin 5 MG TABS Take 5 mg by mouth at bedtime.  Yes [provider]  methocarbamol (ROBAXIN) 500 MG tablet Take 1 tablet (500 mg total) by mouth every 8 (eight) hours as needed for muscle spasms. 01/01/17  Yes Maxie BarbBhandari, Dron Prasad, MD  mirtazapine (REMERON) 15 MG tablet Take 15 mg by mouth at bedtime.   Yes [provider]  Multiple Vitamin (MULTIVITAMIN WITH MINERALS) TABS tablet Take 1 tablet by mouth every evening.   Yes [provider]  polyethylene glycol (MIRALAX / GLYCOLAX) packet Take 17 g by mouth daily as needed. Patient taking differently: Take 17 g by mouth daily as needed (constipation). Mix in 4-8 oz liquid and drink 01/01/17  Yes Maxie BarbBhandari, Dron Prasad, MD  senna (SENOKOT) 8.6 MG TABS tablet Take 1 tablet (8.6 mg total) by mouth daily. 02/27/17  Yes Regalado, Belkys A, MD  traZODone (DESYREL) 50 MG tablet Take 25 mg by mouth 3 (three) times daily.    Yes [provider]  docusate sodium (COLACE) 100 MG capsule Take 1 capsule (100 mg total) by mouth 2 (two)  times daily. Patient not taking: Reported on 05/29/2018 09/24/16   Lanney GinsBabish, Matthew, PA-C  fluticasone [facility](FLONASE) 50 MCG/ACT nasal spray Place 1 spray into both nostrils daily. Patient not taking: Reported on 05/29/2018 11/16/16   Vassie LollMadera, Carlos, MD  OLANZapine (ZYPREXA) 2.5 MG tablet Take 1 tablet (2.5 mg total) by mouth every evening. Patient not taking: Reported on 05/16/2018 02/14/17   Rolly SalterPatel, Pranav M, MD    Family History Family History  Problem Relation Age of Onset  . Stroke Mother        dead  . Clotting disorder Father        dead  bone marrow disease    Social History Social History   Tobacco Use  . Smoking status: Former Smoker    Packs/day: 0.50    Years: 50.00    Pack years: 25.00    Types: Cigarettes    Last attempt to quit: 04/11/2015    Years since quitting: 3.1  . Smokeless tobacco: Never Used  Substance Use Topics  . Alcohol use: Yes    Comment: 12/31/2016 "glass of wine maybe once/month"  . Drug use: No     Allergies   Patient has no known allergies.   Review of Systems Review of Systems  All systems reviewed and negative, other than as noted in HPI.  Physical Exam Updated Vital Signs BP 137/75   Pulse 94   Temp 97.8 F (36.6 C) (Oral)   Resp (!) 34   Ht 5\' 3"  (1.6 m)   Wt 47.5 kg   SpO2 100%   BMI 18.56 kg/m   Physical Exam Vitals signs and nursing note reviewed.  Constitutional:      General: She is not in acute distress.    Appearance: She is well-developed.     Comments: Laying in bed.  No acute distress.  Sometimes there is a several second delay in answering questions but she does answer appropriately.  Following commands.  HENT:     Head: Normocephalic and atraumatic.  Eyes:     General:        Right eye: No discharge.        Left eye: No discharge.     Conjunctiva/sclera: Conjunctivae normal.  Neck:     Musculoskeletal: Neck supple.  Cardiovascular:     Rate and Rhythm: Normal rate and regular rhythm.     Heart sounds:  Normal heart sounds. No murmur. No friction rub. No gallop.  Pulmonary:     Effort: Pulmonary effort is normal. No respiratory distress.     Breath sounds: Normal breath sounds.  Abdominal:     General: There is no distension.     Palpations: Abdomen is soft.     Tenderness: There is no abdominal tenderness.     Comments: Easily palpable and pretty large abdominal aortic aneurysm.  Musculoskeletal:        General: No tenderness.  Skin:    General: Skin is warm and dry.  Neurological:     Mental Status: She is alert.  Psychiatric:        Behavior: Behavior normal.        Thought Content: Thought content normal.      ED Treatments / Results  Labs (all labs ordered are listed, but only abnormal results are displayed) Labs Reviewed  CBC WITH DIFFERENTIAL/PLATELET - Abnormal; Notable for the following components:      Result Value   RBC 3.85 (*)    Hemoglobin 11.5 (*)    All other components within normal limits  URINALYSIS, ROUTINE W REFLEX MICROSCOPIC - Abnormal; Notable for the following components:   APPearance HAZY (*)    Ketones, ur 5 (*)    All other components within normal limits  BASIC METABOLIC PANEL - Abnormal; Notable for the following components:   Creatinine, Ser 1.42 (*)    GFR calc non Af Amer 35 (*)    GFR calc Af Amer 40 (*)    All other components within normal limits    EKG EKG Interpretation  Date/Time:  Friday May 29 2018 11:38:32 EST Ventricular Rate:  81 PR Interval:    QRS Duration: 70 QT Interval:  370 QTC Calculation: 430 R Axis:   40 Text Interpretation:  Sinus rhythm Baseline wander in lead(s) V2 Confirmed by Raeford Razor [id number]) on 05/29/2018 11:54:56 AM   Radiology No results found.  Procedures Procedures (including critical care time)  Medications Ordered in ED Medications - No data to display   Initial Impression / Assessment and Plan / ED Course  I have reviewed the triage vital signs and the nursing  notes.  Pertinent labs & imaging results that were available during my care of the patient were reviewed by me and considered in my medical decision making (see chart for details).     84 year old female with vague sensation of feeling dizzy.  She is hemodynamically stable here in the emergency room.  ED work-up fairly unremarkable.  She is afebrile.  Abdominal aortic aneurysm which is a known entity and apparently patient would not want intervention for this.  Clinically it is not ruptured anyways.  Final Clinical Impressions(s) / ED Diagnoses   Final diagnoses:  Dizziness    ED Discharge Orders    None       Raeford Razor, MD 05/29/18 [phone]

## 2018-05-29 NOTE — ED Notes (Signed)
Pt taken off of bedpan. Pt unable to provide urine sample at this time

## 2018-07-17 IMAGING — CT CT HEAD W/O CM
5 of 8 series · 16 of 47 positions shown, 18 images · non-contrast
Comparison: CT of the head and cervical spine performed 12/29/2016

CLINICAL DATA: Acute onset of dizziness, with fall to the right
side. Right-sided head and neck pain. Initial encounter.

EXAM:
CT HEAD WITHOUT CONTRAST
CT CERVICAL SPINE WITHOUT CONTRAST
TECHNIQUE: Multidetector CT imaging of the head and cervical spine was
performed following the standard protocol without intravenous
contrast. Multiplanar CT image reconstructions of the cervical spine
were also generated.

[Series 4: head wo · axial · 0.41mm/px · z∈[+1396,+1451]mm · 2 of 34 slices shown]
[im 12/34  brain]
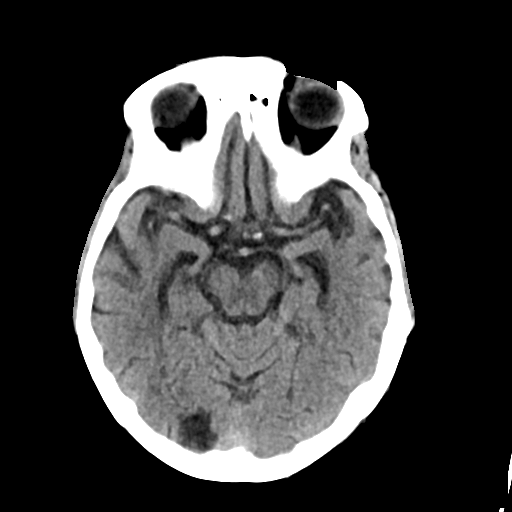
[im 23/34  brain]
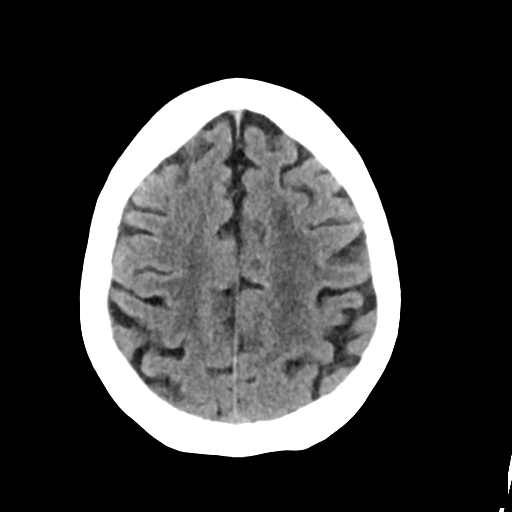

[Series 5: head bone · axial · 0.41mm/px · z∈[+1363,+1387]mm · 2 of 83 slices shown]
[im 12/83  bone]
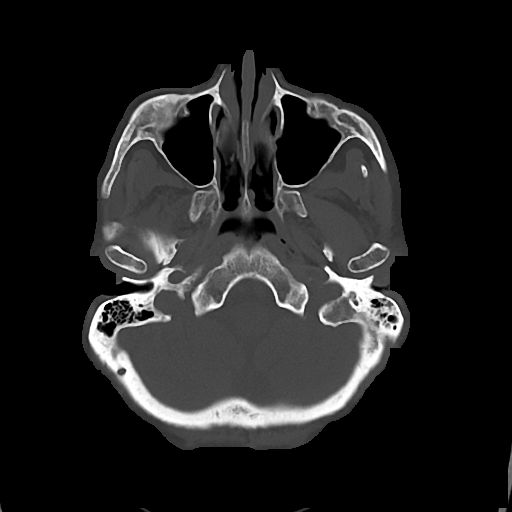
[im 24/83  bone]
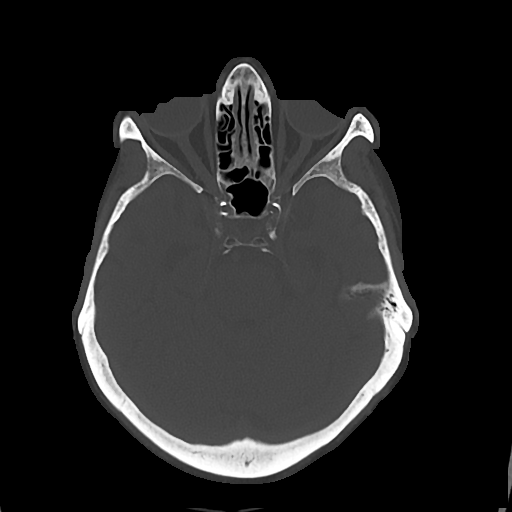

[Series 6: cor soft · coronal · 0.32mm/px · 3 of 67 slices shown]
[im 25/67  brain]
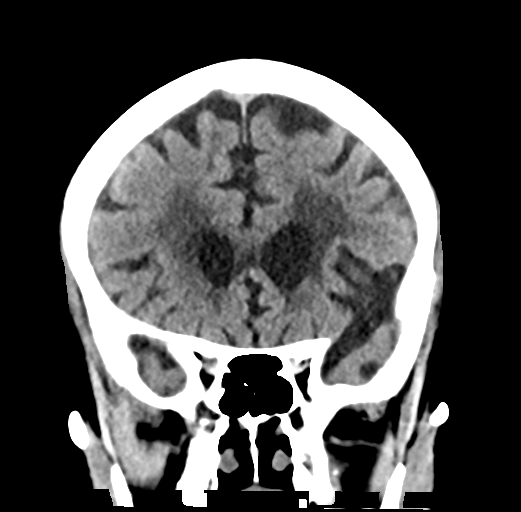
[im 34/67  brain]
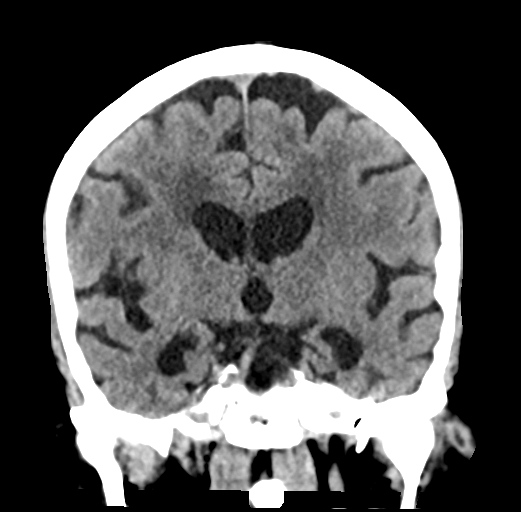
[im 42/67  brain]
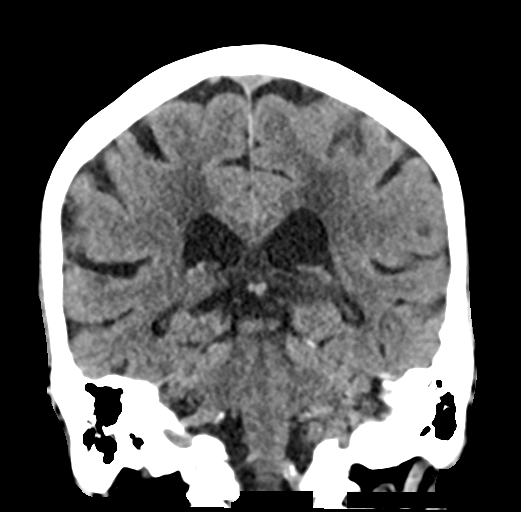

[Series 7: sag soft · sagittal · 0.32mm/px · 1 of 56 slices shown]
[im 28/56  brain]
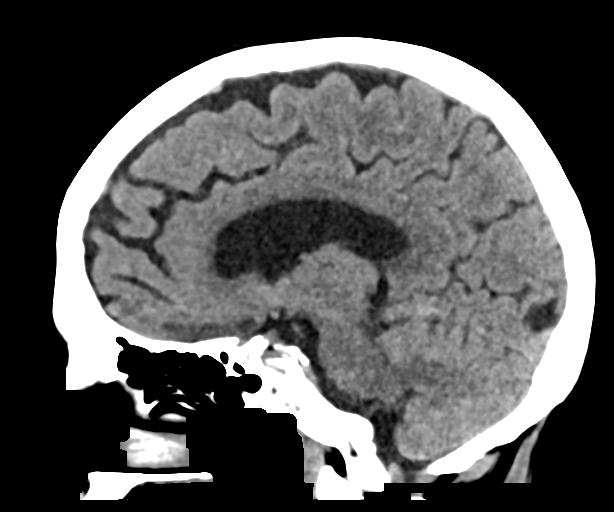

[Series 13: orthogonal axials · oblique · 0.21mm/px · 8 of 98 slices shown, 10 images]
[im 11/98  brain]
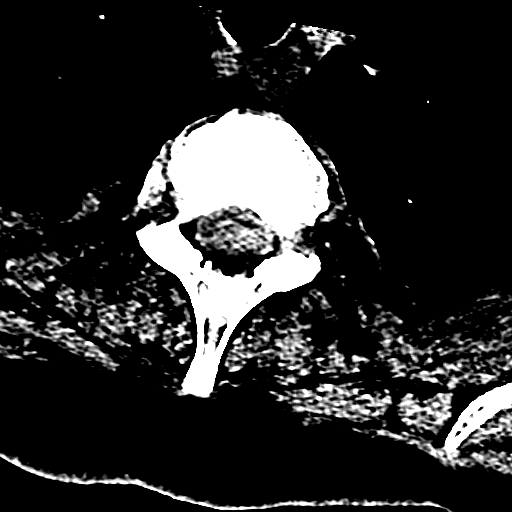
[im 11/98  bone]
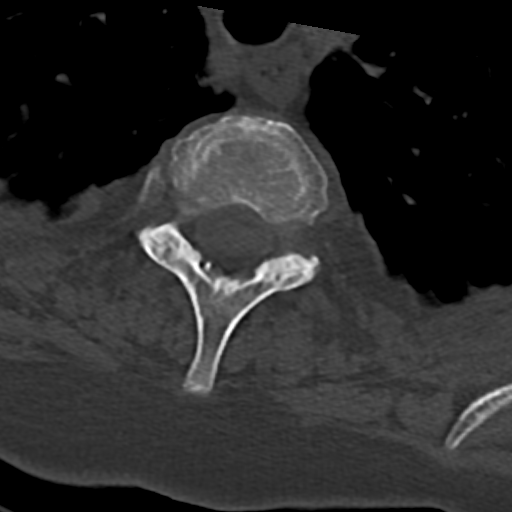
[im 22/98  brain]
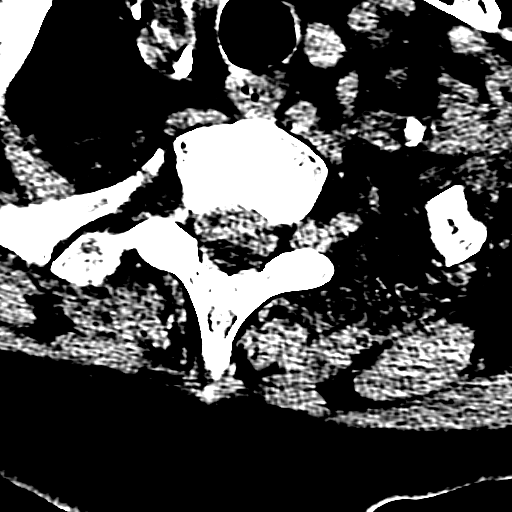
[im 33/98  brain]
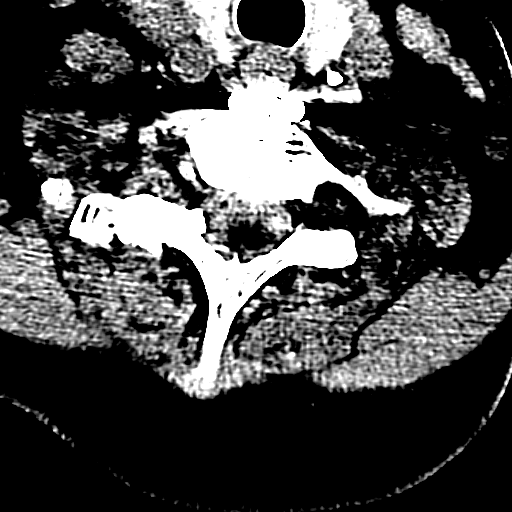
[im 44/98  brain]
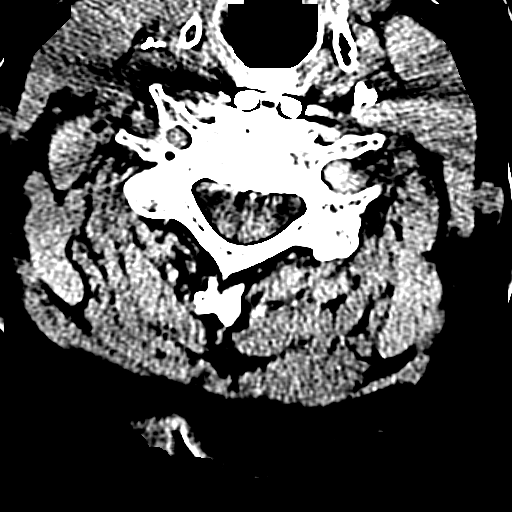
[im 54/98  brain]
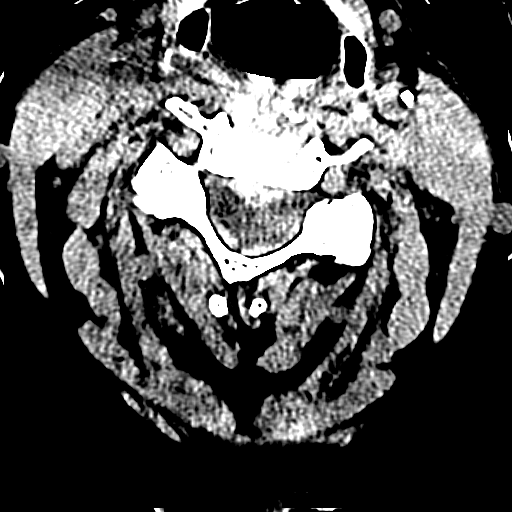
[im 54/98  bone]
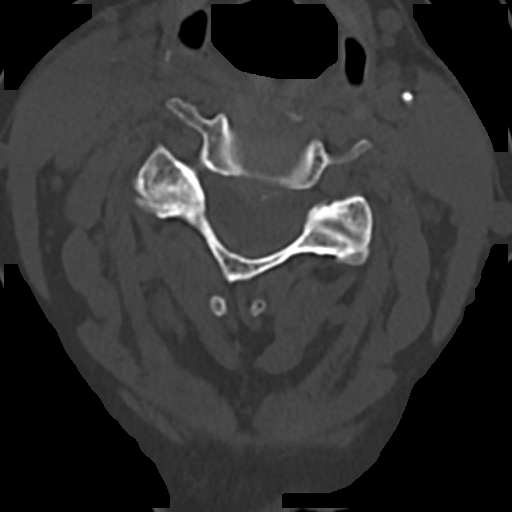
[im 65/98  brain]
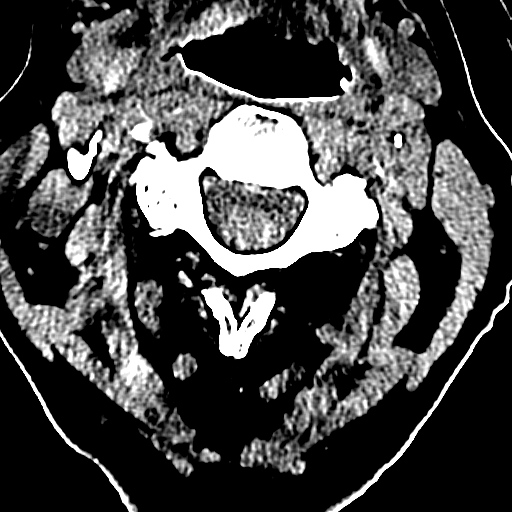
[im 76/98  brain]
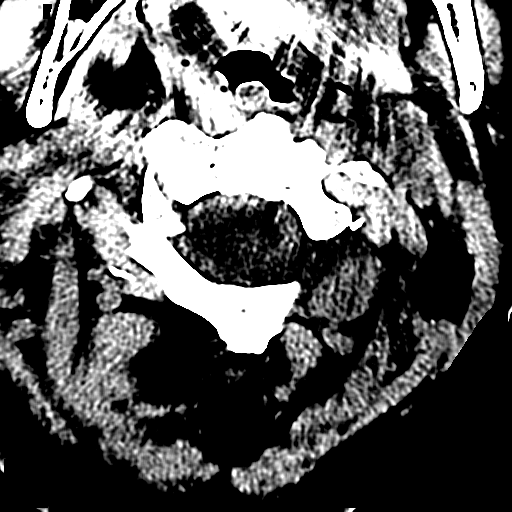
[im 87/98  brain]
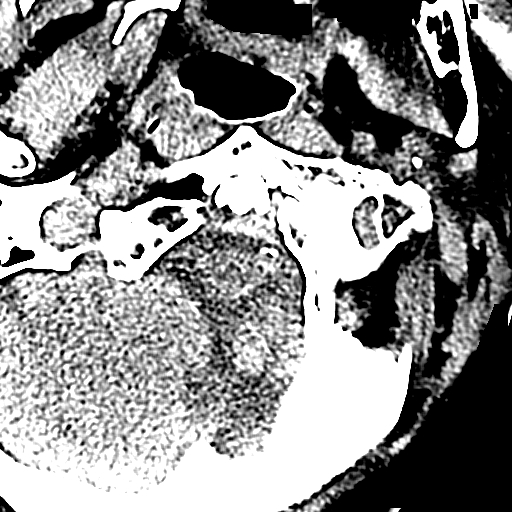

[16 of 47 positions shown; findings below may reference images not displayed]

FINDINGS: CT HEAD FINDINGS

Brain: No evidence of acute infarction, hemorrhage, hydrocephalus,
extra-axial collection or mass lesion/mass effect.

Prominence of the ventricles and sulci reflects moderate cortical
volume loss. Mild cerebellar atrophy is noted. Scattered
periventricular and subcortical white matter change likely reflects
small vessel ischemic microangiopathy. Chronic lacunar infarcts are
noted at the basal ganglia bilaterally. A chronic infarct is noted
at the right occipital lobe, with associated encephalomalacia.

The brainstem and fourth ventricle are within normal limits. No mass
effect or midline shift is seen.

Vascular: No hyperdense vessel or unexpected calcification.

Skull: There is no evidence of fracture; visualized osseous
structures are unremarkable in appearance.

Sinuses/Orbits: The visualized portions of the orbits are within
normal limits. The paranasal sinuses and mastoid air cells are
well-aerated.

Other: No significant soft tissue abnormalities are seen.

CT CERVICAL SPINE FINDINGS

Alignment: Normal.

Skull base and vertebrae: No acute fracture. No primary bone lesion
or focal pathologic process.

Soft tissues and spinal canal: No prevertebral fluid or swelling. No
visible canal hematoma.

Disc levels: The patient is status post anterior cervical spinal
fusion at C5-C7, with mild underlying facet disease. Mild
degenerative change is noted about the dens.

Upper chest: Scattered calcification is noted along the carotid
bifurcations bilaterally, more prominent on the left. The visualized
portions of the thyroid gland are unremarkable. Mild scarring is
noted at the lung apices.

Other: No additional soft tissue abnormalities are seen.
IMPRESSION: 1. No evidence of traumatic intracranial injury or fracture.
2. No evidence of fracture or subluxation along the cervical spine.
3. Moderate cortical volume loss and scattered small vessel ischemic
microangiopathy.
4. Chronic lacunar infarcts at the basal ganglia bilaterally.
Chronic infarct at the right occipital lobe, with associated
encephalomalacia.
5. Status post anterior cervical spinal fusion at C5-C7, with
minimal underlying degenerative change.
6. Scattered calcification along the carotid bifurcations, more
prominent on the left. Carotid ultrasound would be helpful for
further evaluation, when and as deemed clinically appropriate.

## 2018-07-17 IMAGING — CT CT ANGIO CHEST-ABD-PELV FOR DISSECTION W/ AND WO/W CM
2 of 7 series · 11 of 46 positions shown, 12 images · IV contrast (APPLIED)
Comparison: CT of the chest performed 12/29/2016, and CTA of the
abdomen and pelvis performed 01/29/2017

CLINICAL DATA: Acute onset of dizziness. Status post fall.
Follow-up known thoracoabdominal aortic aneurysm. Initial encounter.

EXAM:
CT ANGIOGRAPHY CHEST, ABDOMEN AND PELVIS
TECHNIQUE: Multidetector CT imaging through the chest, abdomen and pelvis was
performed using the standard protocol during bolus administration of
intravenous contrast. Multiplanar reconstructed images and MIPs were
obtained and reviewed to evaluate the vascular anatomy.
CONTRAST:  90 mL of Isovue 370 IV contrast

[Series 6: arterial · axial · arterial · 0.59mm/px · z∈[+706,+1180]mm · 8 of 305 slices shown, 9 images]
[im 34/305  soft-tissue]
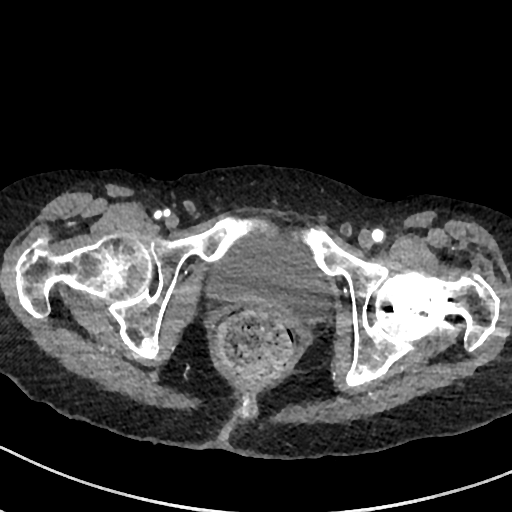
[im 34/305  bone]
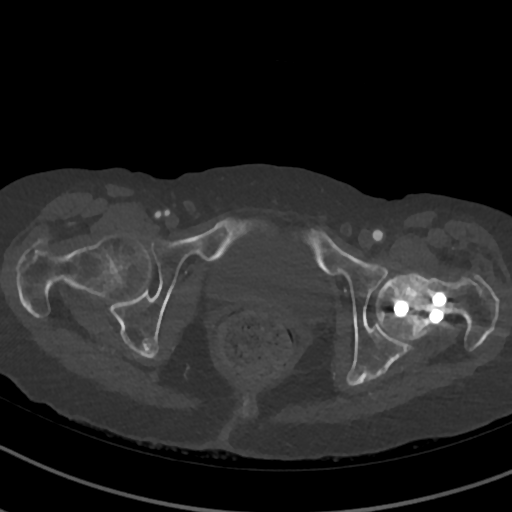
[im 68/305  soft-tissue]
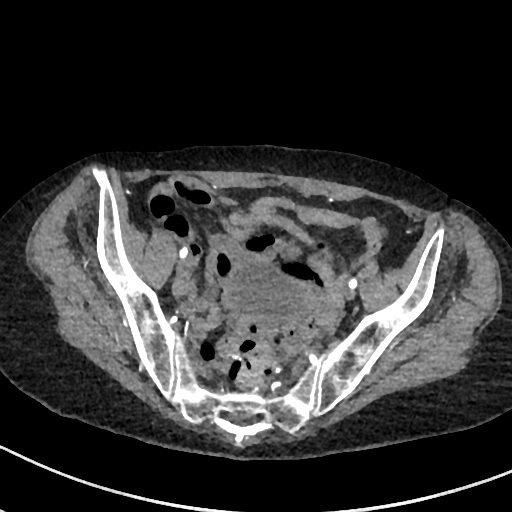
[im 102/305  soft-tissue]
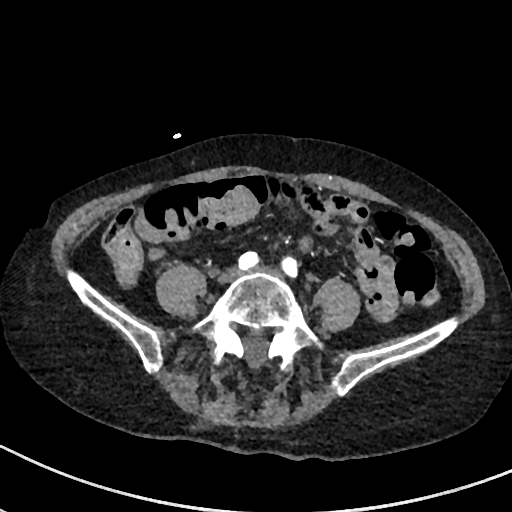
[im 136/305  soft-tissue]
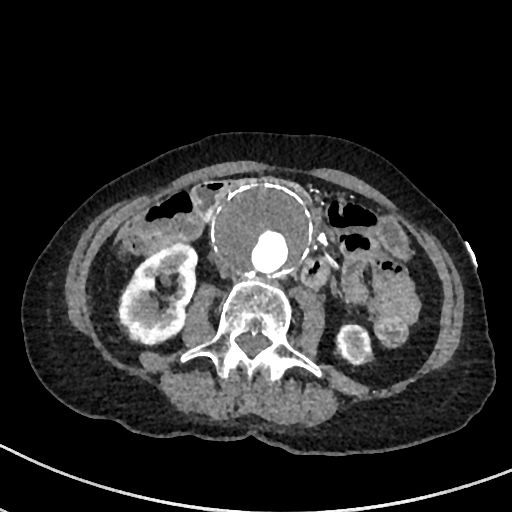
[im 169/305  soft-tissue]
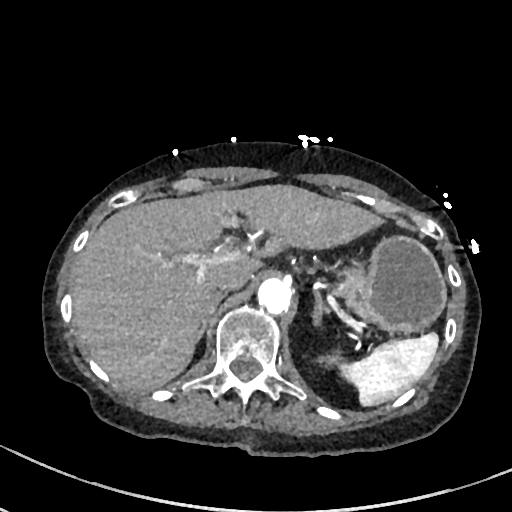
[im 203/305  soft-tissue]
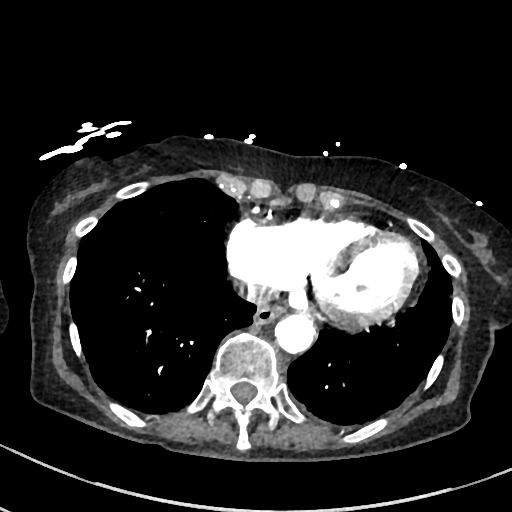
[im 237/305  soft-tissue]
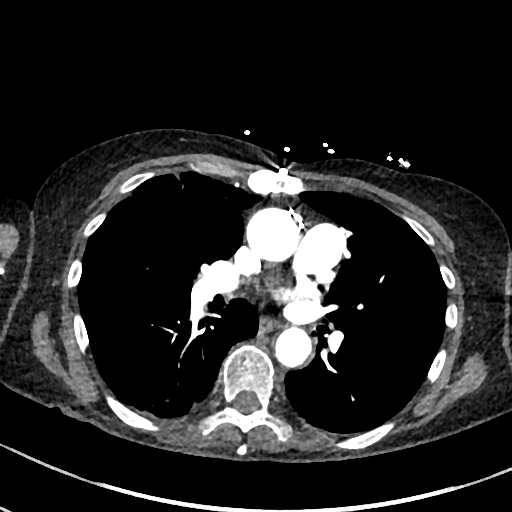
[im 271/305  soft-tissue]
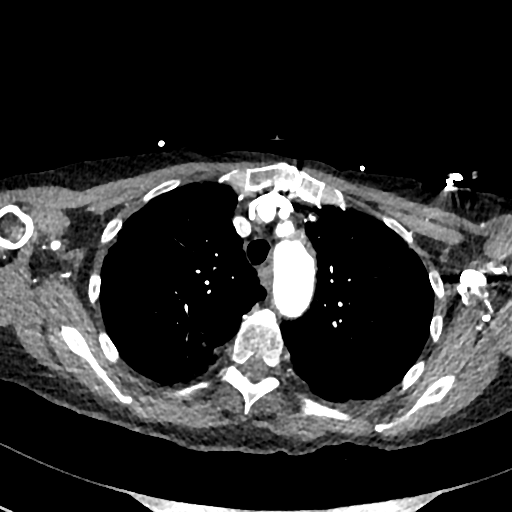

[Series 9: cor · coronal · 0.56mm/px · 3 of 97 slices shown]
[im 25/97  soft-tissue]
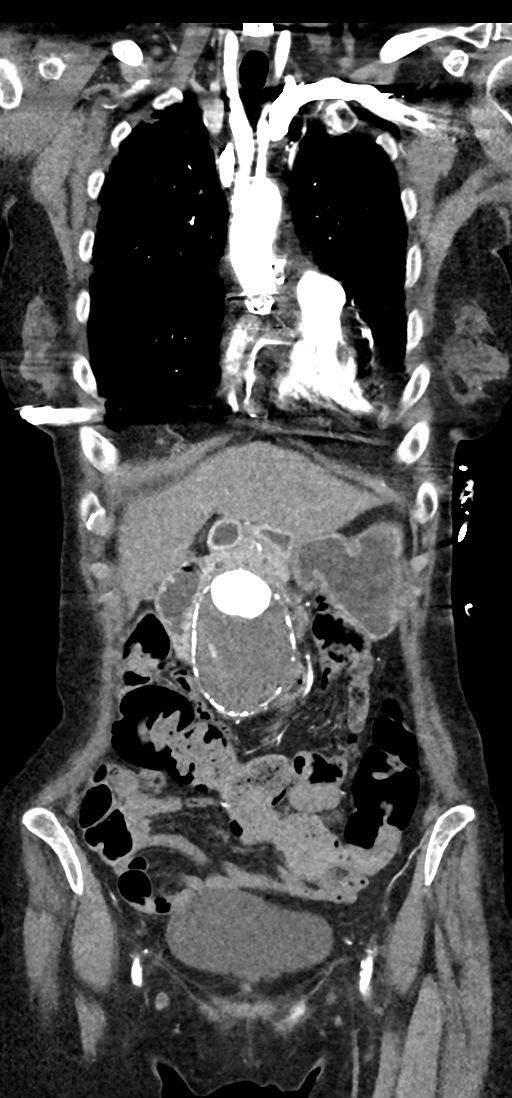
[im 49/97  soft-tissue]
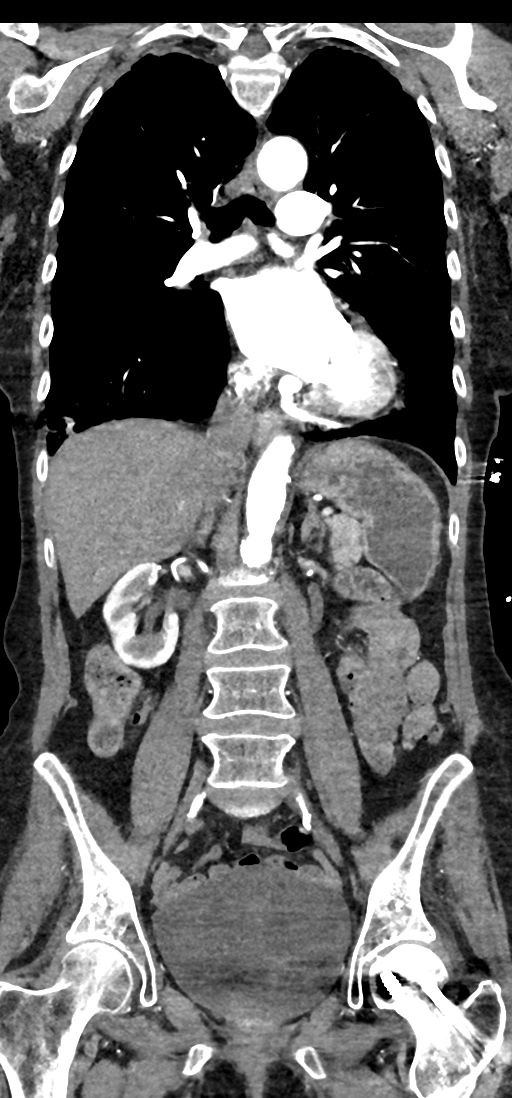
[im 73/97  soft-tissue]
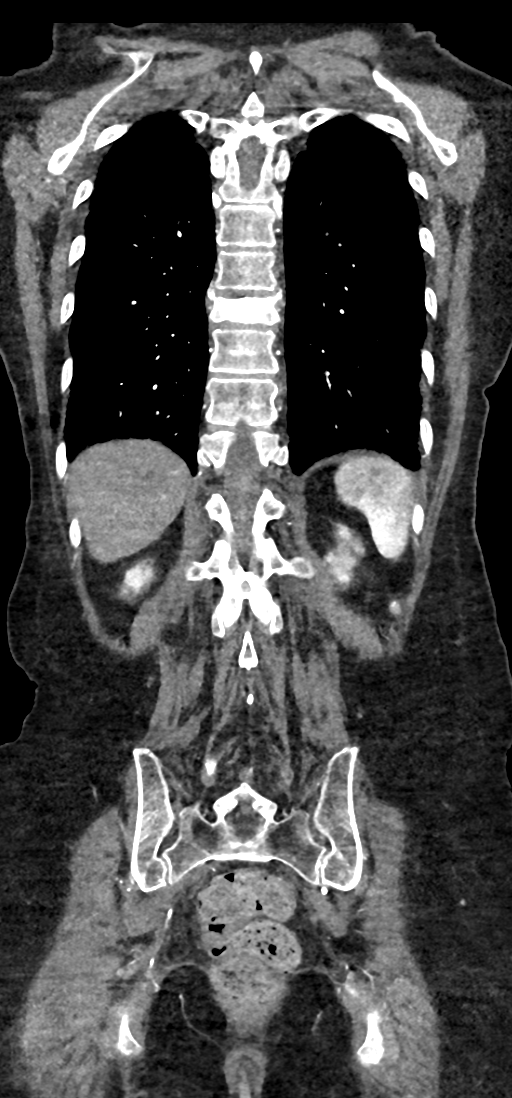

[11 of 46 positions shown; findings below may reference images not displayed]

FINDINGS: CTA CHEST FINDINGS

Cardiovascular: There is no evidence of thoracic aortic aneurysm.
There is no evidence of aortic dissection. Scattered calcification
is noted along the aortic arch and proximal great vessels, and along
the descending thoracic aorta, with minimal associated mural
thrombus but no significant luminal narrowing.

The heart remains normal in size. The patient is status post median
sternotomy, with evidence of prior CABG.

Mediastinum/Nodes: No mediastinal lymphadenopathy is seen. No
pericardial effusion is identified. The visualized portions of the
thyroid gland are unremarkable. No axillary lymphadenopathy is
appreciated.

Lungs/Pleura: Scarring is noted at the lung apices. Bilateral
emphysema is noted, with underlying scarring and peripheral
nodularity. No pleural effusion or pneumothorax is seen. No dominant
mass is identified.

Musculoskeletal: No acute osseous abnormalities are identified. A
compression fracture of vertebral body T9 appears slightly worsened
from the prior study. The visualized musculature is unremarkable in
appearance.

Review of the MIP images confirms the above findings.

CTA ABDOMEN AND PELVIS FINDINGS

VASCULAR

Aorta: There is aneurysmal dilatation of the infrarenal abdominal
aorta to 5.6 cm in AP dimension and 5.8 cm in transverse dimension,
relatively stable from the recent prior CTA. Underlying thrombosis
of the aneurysm sac is relatively stable in appearance. The aneurysm
resolves just proximal to the aortic bifurcation. No soft tissue
inflammation is seen.

Scattered calcification is noted along the abdominal aorta.

Celiac: The celiac trunk appears grossly intact.

SMA: Mild calcification is noted along the superior mesenteric
artery, though it remains grossly intact.

Renals: There is calcification along the proximal renal arteries
bilaterally, with luminal narrowing along the proximal right renal
artery. Would correlate for evidence of refractory hypertension.

IMA: The inferior mesenteric artery is not visualized.

Inflow: Scattered calcification is noted along the common, internal
and external iliac arteries bilaterally. Scattered calcification is
seen at the common femoral arteries bilaterally.

Veins: Visualized venous structures are grossly unremarkable in
appearance.

Review of the MIP images confirms the above findings.

NON-VASCULAR

Hepatobiliary: The liver is unremarkable in appearance. The
gallbladder is unremarkable in appearance. The common bile duct
remains normal in caliber.

Pancreas: The pancreas is within normal limits.

Spleen: The spleen is unremarkable in appearance.

Adrenals/Urinary Tract: The adrenal glands are unremarkable in
appearance.

There is moderate left renal atrophy, and mild bilateral renal
scarring. There is chronic right-sided renal pelvicaliectasis,
without significant hydronephrosis. No distal obstructing stone is
identified. No significant perinephric stranding is appreciated.

Stomach/Bowel: No significant small bowel abnormalities are seen.
The stomach is grossly unremarkable in appearance. The appendix is
not definitely seen; there is no evidence for appendicitis.

Scattered diverticulosis is noted along the sigmoid colon.
Postoperative change is noted at the distal sigmoid colon. There is
no evidence of diverticulitis at this time.

Lymphatic: No retroperitoneal or pelvic sidewall lymphadenopathy is
seen.

Reproductive: The bladder is mildly distended and grossly
unremarkable in appearance. The patient is status post hysterectomy.
No suspicious adnexal masses are seen.

Other: No additional soft tissue abnormalities are seen.

Musculoskeletal: No acute osseous abnormalities are identified. Pins
are noted at the left femoral neck. The visualized musculature is
unremarkable in appearance.

Review of the MIP images confirms the above findings.
IMPRESSION: 1. Infrarenal abdominal aortic aneurysm measures approximately
cm in AP dimension and 5.8 cm in transverse dimension, relatively
stable from the recent prior CTA. Underlying thrombosis of the
aneurysm sac is also stable in appearance. The aneurysm resolves
just proximal to the aortic bifurcation.
2. No evidence of thoracic aortic aneurysm. No evidence of aortic
dissection.
3. Scattered aortic atherosclerosis.
4. Bilateral emphysema, with underlying scarring and peripheral
nodularity. No dominant mass seen. Scarring at the lung apices.
5. Slightly worsened compression fracture vertebral body T9, in
comparison to the prior CT of the chest.
6. Moderate left renal atrophy, and mild bilateral renal scarring.
7. Scattered diverticulosis along the sigmoid colon, without
evidence of diverticulitis.

## 2018-07-29 ENCOUNTER — Telehealth: Payer: Self-pay

## 2018-07-29 NOTE — Telephone Encounter (Signed)
Rec'v incoming call from [facility]. Stated that pt was on palliative care and the DON would have to speak with the MD and the family about wether or not the appointment for tomorrow would be kept or canceled. [place] stated he would give our office a call back later this afternoon to confirm or cancel this appointment. Asked that it was imperative that he give [place] a call back today. Verbal understanding was given and he agreed with this plan.

## 2018-07-30 ENCOUNTER — Other Ambulatory Visit: Payer: Self-pay

## 2018-07-30 ENCOUNTER — Encounter: Payer: Self-pay | Admitting: Vascular Surgery

## 2018-07-30 ENCOUNTER — Ambulatory Visit (INDEPENDENT_AMBULATORY_CARE_PROVIDER_SITE_OTHER): Payer: Medicare Other | Admitting: Vascular Surgery

## 2018-07-30 VITALS — BP 98/54 | HR 58 | Temp 96.7°F | Resp 18 | Ht 63.0 in | Wt 104.0 lb

## 2018-07-30 DIAGNOSIS — I714 Abdominal aortic aneurysm, without rupture, unspecified: Secondary | ICD-10-CM

## 2018-07-30 NOTE — Progress Notes (Signed)
Patient name: Tonya Payne MRN: [id number] DOB: 1935/04/29 Sex: female  REASON FOR CONSULT: Abdominal aortic aneurysm  HPI: Tonya Payne is a 84 y.o. female, with a known abdominal aortic aneurysm.  She was last seen in 2017 for this.  At that time her aneurysm diameter was 5.3 cm.  She declined repair at that point as she felt very frail and did not think she could undergo the operation.  She was then lost to follow-up.  She recently had a CT scan of the abdomen and pelvis done in January which shows the aneurysm has now grown in size to 6.7 cm diameter.  She continues to deny any significant abdominal or back pain.  The CT scan was performed for a syncopal episode.  She has become more frail over the last 2 years.  She currently is residing at [facility] and rehab.  She is in a wheelchair most of the time.  She is able to walk some around her room to get back and forth to the bathroom.  Other medical problems include coronary artery disease, hyperlipidemia as well as mild to moderate dementia.  These are all currently stable.  Past Medical History:  Diagnosis Date   AAA (abdominal aortic aneurysm) (HCC)    AAA (abdominal aortic aneurysm) without rupture (HCC) 2016   CTA 10/23/2015 - saccular infrarenal aneurysm roughly 4.8 cm in diameter. Stable from 2016 -- Dr. Darrick Penna   Anterior wall myocardial infarction [facility]) 10/08/2015   Presented with severe chest pain and dynamic anterior ST elevations/biphasic elevations area did not meet full criteria for STEMI, and troponin was not dramatically elevated. 95% LAD treated with PTCA followed by CABG   Anxiety    Atrial fibrillation, new onset (HCC) 10/2015   after CABG, in SR at d/c   CAD (coronary artery disease), native coronary artery 10/08/2015   Severe coronary disease with calcified left main and LAD: 5% left main going into ostial LAD. Mid LAD 95% (treated with PTCA). Proximal RCA 50%, calcified. EF was 20-25% with apical akinesis and  distal anterior hypokinesis. --> Referred for CABG   CAP (community acquired pneumonia) 07/2013   Hattie Perch 07/09/2013   Congestive dilated cardiomyopathy (HCC) 10/08/2015   Intra-Op TEE showed EF of 40 and 45%. She had an echo with a "normal LV function "documented, but there was no reading M.D.   COPD (chronic obstructive pulmonary disease) (HCC)    Depression    Headache    "weekly" (12/31/2016)   Hyperlipidemia LDL goal <70 10/08/2015   IBS (irritable bowel syndrome)    Migraine    "none in years" (12/31/2016)   Oral thrush    Post PTCA 10/08/2015   Presented with STEMI, severe mid LAD lesion treated with PTCA and then referred for CABG.   Syncope    Syncope and collapse 05/07/2018   TIA (transient ischemic attack)    Past Surgical History:  Procedure Laterality Date   ABDOMINAL HYSTERECTOMY     BACK SURGERY     CARDIAC CATHETERIZATION N/A 10/06/2015   Procedure: Left Heart Cath and Coronary Angiography;  Surgeon: Marykay Lex, MD;  Location: [facility] INVASIVE CV LAB;  Service: Cardiovascular;: Heavily calcified left main-LAD. 75% LM & Ost LAD, 95% d-mLAD, p-mRCA Calcified 50%. EF ~20-25%   CARDIAC CATHETERIZATION N/A 10/06/2015   Procedure: Coronary Balloon Angioplasty;  Surgeon: Marykay Lex, MD;  Location: [facility] INVASIVE CV LAB;  Service: Cardiovascular: PTCA of 95% LAD in setting of STEMI - reduced to 70% -->  would need Atherectomy-PCI vs. CABG.  Sent for CABG.   COLECTOMY     CORONARY ARTERY BYPASS GRAFT N/A 10/16/2015   Procedure: CORONARY ARTERY BYPASS GRAFTING (CABG)  x three, using left internal mammary artery and right leg greater saphenous vein harvested endoscopically;  Surgeon: Loreli Slot, MD;  Location: [facility] OR;  Service: Open Heart Surgery;  Laterality: N/A;   FRACTURE SURGERY     HIP PINNING,CANNULATED Left 09/24/2016   Procedure: CANNULATED HIP PINNING LEFT HIP;  Surgeon: Durene Romans, MD;  Location: [facility] ORS;  Service: Orthopedics;  Laterality: Left;     LUMBAR DISC SURGERY     L3,4,5; S1"   NECK SURGERY     TEE WITHOUT CARDIOVERSION N/A 10/16/2015   Procedure: TRANSESOPHAGEAL ECHOCARDIOGRAM (TEE);  Surgeon: Loreli Slot, MD;  Location: [facility] OR;  Service: Open Heart Surgery - IntraOp:  EF 40-45%.   TONSILLECTOMY     TOTAL HIP ARTHROPLASTY Right     Family History  Problem Relation Age of Onset   Stroke Mother        dead   Clotting disorder Father        dead  bone marrow disease    SOCIAL HISTORY: Social History   Socioeconomic History   Marital status: Widowed    Spouse name: Not on file   Number of children: Not on file   Years of education: Not on file   Highest education level: Not on file  Occupational History   Not on file  Social Needs   Financial resource strain: Not on file   Food insecurity:    Worry: Not on file    Inability: Not on file   Transportation needs:    Medical: Not on file    Non-medical: Not on file  Tobacco Use   Smoking status: Former Smoker    Packs/day: 0.50    Years: 50.00    Pack years: 25.00    Types: Cigarettes    Last attempt to quit: 04/11/2015    Years since quitting: 3.3   Smokeless tobacco: Never Used  Substance and Sexual Activity   Alcohol use: Yes    Comment: 12/31/2016 "glass of wine maybe once/month"   Drug use: No   Sexual activity: Not Currently  Lifestyle   Physical activity:    Days per week: Not on file    Minutes per session: Not on file   Stress: Not on file  Relationships   Social connections:    Talks on phone: Not on file    Gets together: Not on file    Attends religious service: Not on file    Active member of club or organization: Not on file    Attends meetings of clubs or organizations: Not on file    Relationship status: Not on file   Intimate partner violence:    Fear of current or ex partner: Not on file    Emotionally abused: Not on file    Physically abused: Not on file    Forced sexual activity: Not on file   Other Topics Concern   Not on file  Social History Narrative   Not on file    No Known Allergies  Current Outpatient Medications  Medication Sig Dispense Refill   acetaminophen (TYLENOL) 500 MG tablet Take 1,000 mg by mouth every 12 (twelve) hours.      ARIPiprazole (ABILIFY) 2 MG tablet Take 2 mg by mouth daily.     aspirin EC 81 MG tablet Take  81 mg by mouth every evening.      clobetasol (TEMOVATE) 0.05 % external solution Apply 1 application topically 2 (two) times daily as needed (scalp psoriasis).      divalproex (DEPAKOTE) 250 MG DR tablet Take 250 mg by mouth daily.     divalproex (DEPAKOTE) 500 MG DR tablet Take 500 mg by mouth at bedtime.     docusate sodium (COLACE) 100 MG capsule Take 1 capsule (100 mg total) by mouth 2 (two) times daily. 10 capsule 0   fluticasone (FLONASE) 50 MCG/ACT nasal spray Place 1 spray into both nostrils daily. 16 g 2   Glucosamine Sulfate 500 MG CAPS Take 1,500 mg by mouth daily.     meclizine (ANTIVERT) 12.5 MG tablet Take 12.5 mg by mouth 3 (three) times daily. 8a, 1p, 5p     Melatonin 5 MG TABS Take 5 mg by mouth at bedtime.      memantine (NAMENDA) 5 MG tablet Take 5 mg by mouth 2 (two) times daily.     methocarbamol (ROBAXIN) 500 MG tablet Take 1 tablet (500 mg total) by mouth every 8 (eight) hours as needed for muscle spasms. 30 tablet 0   mirtazapine (REMERON) 15 MG tablet Take 15 mg by mouth at bedtime.     Multiple Vitamin (MULTIVITAMIN WITH MINERALS) TABS tablet Take 1 tablet by mouth every evening.     OLANZapine (ZYPREXA) 2.5 MG tablet Take 1 tablet (2.5 mg total) by mouth every evening. 30 tablet 0   polyethylene glycol (MIRALAX / GLYCOLAX) packet Take 17 g by mouth daily as needed. (Patient taking differently: Take 17 g by mouth daily as needed (constipation). Mix in 4-8 oz liquid and drink) 14 each 0   senna (SENOKOT) 8.6 MG TABS tablet Take 1 tablet (8.6 mg total) by mouth daily. 120 each 0   traZODone (DESYREL)  50 MG tablet Take 25 mg by mouth 3 (three) times daily.      No current facility-administered medications for this visit.     ROS:   General:  No weight loss, Fever, chills  HEENT: No recent headaches, no nasal bleeding, no visual changes, no sore throat  Neurologic: + dizziness, blackouts, no seizures . No recent symptoms of stroke or mini- stroke. No recent episodes of slurred speech, or temporary blindness.  Cardiac: No recent episodes of chest pain/pressure, no shortness of breath at rest.  + shortness of breath with exertion.  Denies history of atrial fibrillation or irregular heartbeat  Vascular: No history of rest pain in feet.  No history of claudication.  No history of non-healing ulcer, No history of DVT   Pulmonary: No home oxygen, no productive cough, no hemoptysis,  No asthma or wheezing  Musculoskeletal:  [ ]  Arthritis, [ ]  Low back pain,  [ ]  Joint pain  Hematologic:No history of hypercoagulable state.  No history of easy bleeding.  No history of anemia  Gastrointestinal: No hematochezia or melena,  No gastroesophageal reflux, no trouble swallowing  Urinary: [ ]  chronic Kidney disease, [ ]  on HD - [ ]  MWF or [ ]  TTHS, [ ]  Burning with urination, [ ]  Frequent urination, [ ]  Difficulty urinating;   Skin: No rashes  Psychological: No history of anxiety,  No history of depression   Physical Examination  Vitals:   07/30/18 1056  BP: (!) 98/54  Pulse: (!) 58  Resp: 18  Temp: (!) 96.7 F (35.9 C)  Weight: 104 lb (47.2 kg)  Height: 5\' 3"  (1.6  m)    Body mass index is 18.42 kg/m.  General:  Alert and oriented, no acute distress HEENT: Normal Neck: No bruit or JVD Pulmonary: Clear to auscultation bilaterally Cardiac: Regular Rate and Rhythm without murmur Abdomen: Soft, non-tender, non-distended, easily palpable epigastric pulsatile mass consistent with 7 cm abdominal aortic aneurysm Skin: No rash Extremity Pulses:  2+ radial, brachial, femoral, absent  popliteal dorsalis pedis, posterior tibial pulses bilaterally Musculoskeletal: No deformity or edema  Neurologic: Upper and lower extremity motor 5/5 and symmetric  DATA:  I reviewed the patient's CT scan from January.  This shows 6.7 cm infrarenal abdominal aortic aneurysm with some tortuosity of the neck.  Access vessels in the external iliac 5 mm diameter  ASSESSMENT: Patient was 6.7 cm infrarenal abdominal aortic aneurysm.  Risk of rupture is somewhere between 10 to 20 %/year.  The patient is not a candidate for open repair due to her severe frailty.  I do not believe she would survive an open elective operation much less an operation with a rupture.  I reviewed her images from her CT scan today and also had these reviewed with a company representative from [employer].  We both agree that her access vessels are not large enough to accommodate the sheath that would need to be necessary to place a stent graft.  This unfortunately means that she is not a candidate for stent graft repair of her aneurysm.   PLAN: I discussed with the patient today that she is not a candidate for open or stent graft repair of her abdominal aortic aneurysm.  She understands that the risk of rupture is about 10 to 20 %/year and if the aneurysm ruptures she will die from this event.  However, unfortunately she is not a candidate for elective repair.  I also would not consider her for repair if the aneurysm ruptures and would only invoke palliative measures at that point.  I am not going to schedule the patient for further follow-up since we are not going to consider repair of her aneurysm.  I did make an attempt to call the patient's son at her request.  I left a voicemail for him to call us back but have so far not received a return phone call.  Fabienne Bruns, MD [facility] Office: [phone] Pager: [phone]

## 2018-08-03 ENCOUNTER — Encounter: Payer: Self-pay | Admitting: Vascular Surgery

## 2018-08-31 ENCOUNTER — Institutional Professional Consult (permissible substitution): Payer: Medicare Other | Admitting: Internal Medicine

## 2018-09-09 ENCOUNTER — Institutional Professional Consult (permissible substitution): Payer: Medicare Other | Admitting: Internal Medicine

## 2018-09-22 ENCOUNTER — Other Ambulatory Visit: Payer: Self-pay

## 2018-09-22 ENCOUNTER — Non-Acute Institutional Stay: Payer: Medicare Other | Admitting: Student

## 2018-09-22 DIAGNOSIS — Z515 Encounter for palliative care: Secondary | ICD-10-CM

## 2018-09-22 NOTE — Progress Notes (Signed)
[facility] Consult Note Telephone: [phone]  Fax: [phone]  PATIENT NAME: Tonya Payne DOB: 07-Dec-1934 MRN: [id number]  PRIMARY CARE PROVIDER:   Bernette Payne, Amy, MD  REFERRING PROVIDER:  Bernette Payne, Amy, MD [address] [place], [place] [address]  RESPONSIBLE PARTY:  Tonya PheasantSon, Timothy Payne.   ASSESSMENT: Due to the COVID-19 crisis, this visit was done via telemedicine and it was initiated and consent by this patient and or family. [name] is alert and oriented. She has a pleasant demeanor. She engaged in visit and answered questions. Discussed ongoing goals of care and symptom management. Palliative Medicine will continue to monitor and make recommendations as necessary. Will call son to follow up on today's visit.      RECOMMENDATIONS and PLAN:  1. Code status: DNR. 2. Medical goals of therapy: Palliative Medicine will continue to monitor for changes and declines and make recommendations as needed.  3. Symptom management: Anxiety-currently being managed by psychiatry. Insomnia-receiving melatonin; managed by psychiatry. Appetite-continue remeron 15mg  qhs.   4. Discharge Planning: [name] [facility].   Palliative Medicine will follow up in 8 weeks or sooner if needed.   I spent 15 minutes providing this consultation,  from 2:45pm to 3:00pm. More than 50% of the time in this consultation was spent coordinating communication.   HISTORY OF PRESENT ILLNESS:  Tonya Payne is a 84 y.o. female with multiple medical problems including AAA, atrial fibrillation, COPD,IBS, dementia, depression, hyperlipidemia, syncope, hx of MI. Palliative Care was asked to help address goals of care; she is seen for follow up visit today. [name] has been staying in bed more per Tonya FettersADON Tonya Payne. She is alert and oriented, with some forgetfulness noted. She has syncopal episodes where she "goes unresponsive  for 2-3 minutes." Last episode was on 08/24/2018. Candelaria StagersLeigh Payne states that patient will decline to go to hospital when episodes occur. She was seen in ER on 05/29/2018 for dizziness. She denies pain, headaches, dizziness, shortness of breath. She did see Vascular on 07/30/2018; she is not a surgical candidate for her AAA. [name] does have history of anxiety and is followed by psychiatry. She is seen every 3 weeks. She reports sleeping well at night. Her namenda was increased to 10mg  BID 08/13/2018; she is also receiving depakote 250mg  daily and 500mg  qhs, remeron 15mg  qhs, trazodone 25mg  tid, melatonin 5mg  qhs. She reports a fair appetite; her most recent weight is 98 pounds.   CODE STATUS: DNR, Limited MOST on file at facility  PPS: 30% HOSPICE ELIGIBILITY/DIAGNOSIS: TBD  PAST MEDICAL HISTORY:  Past Medical History:  Diagnosis Date  . AAA (abdominal aortic aneurysm) (HCC)   . AAA (abdominal aortic aneurysm) without rupture (HCC) 2016   CTA 10/23/2015 - saccular infrarenal aneurysm roughly 4.8 cm in diameter. Stable from 2016 -- Dr. Darrick PennaFields  . Anterior wall myocardial infarction (HCC) 10/08/2015   Presented with severe chest pain and dynamic anterior ST elevations/biphasic elevations area did not meet full criteria for STEMI, and troponin was not dramatically elevated. 95% LAD treated with PTCA followed by CABG  . Anxiety   . Atrial fibrillation, new onset (HCC) 10/2015   after CABG, in SR at d/c  . CAD (coronary artery disease), native coronary artery 10/08/2015   Severe coronary disease with calcified left main and LAD: 5% left main going into ostial LAD. Mid LAD 95% (treated with PTCA). Proximal RCA 50%, calcified. EF was 20-25% with  apical akinesis and distal anterior hypokinesis. --> Referred for CABG  . CAP (community acquired pneumonia) 07/2013   Hattie Perch 07/09/2013  . Congestive dilated cardiomyopathy (HCC) 10/08/2015   Intra-Op TEE showed EF of 40 and 45%. She had an echo with a "normal LV  function "documented, but there was no reading M.D.  . COPD (chronic obstructive pulmonary disease) (HCC)   . Depression   . Headache    "weekly" (12/31/2016)  . Hyperlipidemia LDL goal <70 10/08/2015  . IBS (irritable bowel syndrome)   . Migraine    "none in years" (12/31/2016)  . Oral thrush   . Post PTCA 10/08/2015   Presented with STEMI, severe mid LAD lesion treated with PTCA and then referred for CABG.  . Syncope   . Syncope and collapse 05/07/2018  . TIA (transient ischemic attack)     SOCIAL HX:  Social History   Tobacco Use  . Smoking status: Former Smoker    Packs/day: 0.50    Years: 50.00    Pack years: 25.00    Types: Cigarettes    Last attempt to quit: 04/11/2015    Years since quitting: 3.4  . Smokeless tobacco: Never Used  Substance Use Topics  . Alcohol use: Yes    Comment: 12/31/2016 "glass of wine maybe once/month"    ALLERGIES: No Known Allergies     PHYSICAL EXAM:   General: NAD, frail appearing   Luella Cook, NP

## 2019-01-05 DEATH — deceased
